# Patient Record
Sex: Male | Born: 1955
Health system: Southern US, Community
[De-identification: ages and names within clinical notes are randomized; demographics above are authoritative.]

## PROBLEM LIST (undated history)

## (undated) DIAGNOSIS — G709 Myoneural disorder, unspecified: Secondary | ICD-10-CM

## (undated) DIAGNOSIS — R269 Unspecified abnormalities of gait and mobility: Secondary | ICD-10-CM

## (undated) DIAGNOSIS — M5030 Other cervical disc degeneration, unspecified cervical region: Secondary | ICD-10-CM

## (undated) DIAGNOSIS — E785 Hyperlipidemia, unspecified: Secondary | ICD-10-CM

## (undated) DIAGNOSIS — R339 Retention of urine, unspecified: Secondary | ICD-10-CM

## (undated) DIAGNOSIS — R61 Generalized hyperhidrosis: Secondary | ICD-10-CM

## (undated) DIAGNOSIS — R301 Vesical tenesmus: Secondary | ICD-10-CM

## (undated) DIAGNOSIS — R4789 Other speech disturbances: Secondary | ICD-10-CM

## (undated) DIAGNOSIS — I69319 Unspecified symptoms and signs involving cognitive functions following cerebral infarction: Secondary | ICD-10-CM

## (undated) DIAGNOSIS — Z9181 History of falling: Secondary | ICD-10-CM

## (undated) DIAGNOSIS — I739 Peripheral vascular disease, unspecified: Secondary | ICD-10-CM

## (undated) DIAGNOSIS — I69398 Other sequelae of cerebral infarction: Secondary | ICD-10-CM

## (undated) DIAGNOSIS — G8194 Hemiplegia, unspecified affecting left nondominant side: Secondary | ICD-10-CM

## (undated) DIAGNOSIS — R350 Frequency of micturition: Secondary | ICD-10-CM

## (undated) DIAGNOSIS — K5909 Other constipation: Secondary | ICD-10-CM

## (undated) DIAGNOSIS — Z794 Long term (current) use of insulin: Secondary | ICD-10-CM

## (undated) DIAGNOSIS — G4700 Insomnia, unspecified: Secondary | ICD-10-CM

## (undated) DIAGNOSIS — K5900 Constipation, unspecified: Secondary | ICD-10-CM

## (undated) DIAGNOSIS — N3941 Urge incontinence: Secondary | ICD-10-CM

## (undated) DIAGNOSIS — K922 Gastrointestinal hemorrhage, unspecified: Secondary | ICD-10-CM

## (undated) DIAGNOSIS — M1990 Unspecified osteoarthritis, unspecified site: Secondary | ICD-10-CM

## (undated) DIAGNOSIS — R1310 Dysphagia, unspecified: Secondary | ICD-10-CM

## (undated) DIAGNOSIS — M4802 Spinal stenosis, cervical region: Secondary | ICD-10-CM

## (undated) DIAGNOSIS — N189 Chronic kidney disease, unspecified: Secondary | ICD-10-CM

## (undated) DIAGNOSIS — I6521 Occlusion and stenosis of right carotid artery: Secondary | ICD-10-CM

## (undated) DIAGNOSIS — C61 Malignant neoplasm of prostate: Secondary | ICD-10-CM

## (undated) DIAGNOSIS — K648 Other hemorrhoids: Secondary | ICD-10-CM

## (undated) DIAGNOSIS — H903 Sensorineural hearing loss, bilateral: Secondary | ICD-10-CM

## (undated) DIAGNOSIS — E119 Type 2 diabetes mellitus without complications: Secondary | ICD-10-CM

## (undated) DIAGNOSIS — N3289 Other specified disorders of bladder: Secondary | ICD-10-CM

## (undated) DIAGNOSIS — M19012 Primary osteoarthritis, left shoulder: Secondary | ICD-10-CM

## (undated) DIAGNOSIS — Z8673 Personal history of transient ischemic attack (TIA), and cerebral infarction without residual deficits: Secondary | ICD-10-CM

## (undated) DIAGNOSIS — I10 Essential (primary) hypertension: Secondary | ICD-10-CM

## (undated) DIAGNOSIS — Z960 Presence of urogenital implants: Secondary | ICD-10-CM

## (undated) DIAGNOSIS — Z7901 Long term (current) use of anticoagulants: Secondary | ICD-10-CM

## (undated) DIAGNOSIS — Z972 Presence of dental prosthetic device (complete) (partial): Secondary | ICD-10-CM

## (undated) DIAGNOSIS — I639 Cerebral infarction, unspecified: Secondary | ICD-10-CM

## (undated) HISTORY — DX: Hyperlipidemia, unspecified: E78.5

## (undated) HISTORY — DX: Essential (primary) hypertension: I10

---

## 1998-04-14 ENCOUNTER — Emergency Department (HOSPITAL_COMMUNITY): Admission: EM | Admit: 1998-04-14 | Discharge: 1998-04-14 | Payer: Self-pay | Admitting: Emergency Medicine

## 1998-04-14 ENCOUNTER — Encounter: Payer: Self-pay | Admitting: Emergency Medicine

## 1998-09-21 ENCOUNTER — Emergency Department (HOSPITAL_COMMUNITY): Admission: EM | Admit: 1998-09-21 | Discharge: 1998-09-21 | Payer: Self-pay | Admitting: Emergency Medicine

## 1998-09-22 ENCOUNTER — Emergency Department (HOSPITAL_COMMUNITY): Admission: EM | Admit: 1998-09-22 | Discharge: 1998-09-22 | Payer: Self-pay

## 1999-01-29 ENCOUNTER — Emergency Department (HOSPITAL_COMMUNITY): Admission: EM | Admit: 1999-01-29 | Discharge: 1999-01-29 | Payer: Self-pay | Admitting: Emergency Medicine

## 1999-11-03 ENCOUNTER — Encounter: Payer: Self-pay | Admitting: Internal Medicine

## 1999-11-03 ENCOUNTER — Encounter: Payer: Self-pay | Admitting: Emergency Medicine

## 1999-11-03 ENCOUNTER — Observation Stay (HOSPITAL_COMMUNITY): Admission: EM | Admit: 1999-11-03 | Discharge: 1999-11-04 | Payer: Self-pay | Admitting: Emergency Medicine

## 1999-11-04 ENCOUNTER — Encounter: Payer: Self-pay | Admitting: Internal Medicine

## 1999-11-27 ENCOUNTER — Inpatient Hospital Stay (HOSPITAL_COMMUNITY): Admission: RE | Admit: 1999-11-27 | Discharge: 1999-11-28 | Payer: Self-pay | Admitting: Neurosurgery

## 1999-11-27 ENCOUNTER — Encounter: Payer: Self-pay | Admitting: Neurosurgery

## 1999-11-27 HISTORY — PX: ANTERIOR CERVICAL DECOMP/DISCECTOMY FUSION: SHX1161

## 1999-12-17 ENCOUNTER — Encounter: Payer: Self-pay | Admitting: Neurosurgery

## 1999-12-17 ENCOUNTER — Encounter: Admission: RE | Admit: 1999-12-17 | Discharge: 1999-12-17 | Payer: Self-pay | Admitting: Neurosurgery

## 2000-01-23 ENCOUNTER — Encounter: Payer: Self-pay | Admitting: Neurosurgery

## 2000-01-23 ENCOUNTER — Encounter: Admission: RE | Admit: 2000-01-23 | Discharge: 2000-01-23 | Payer: Self-pay | Admitting: Neurosurgery

## 2000-12-03 ENCOUNTER — Emergency Department (HOSPITAL_COMMUNITY): Admission: EM | Admit: 2000-12-03 | Discharge: 2000-12-03 | Payer: Self-pay | Admitting: Emergency Medicine

## 2000-12-15 ENCOUNTER — Encounter: Admission: RE | Admit: 2000-12-15 | Discharge: 2000-12-15 | Payer: Self-pay | Admitting: Family Medicine

## 2001-10-07 ENCOUNTER — Ambulatory Visit (HOSPITAL_BASED_OUTPATIENT_CLINIC_OR_DEPARTMENT_OTHER): Admission: RE | Admit: 2001-10-07 | Discharge: 2001-10-07 | Payer: Self-pay | Admitting: Urology

## 2001-10-07 HISTORY — PX: CIRCUMCISION: SUR203

## 2003-01-08 HISTORY — PX: FOOT SURGERY: SHX648

## 2004-07-25 ENCOUNTER — Ambulatory Visit: Payer: Self-pay | Admitting: Gastroenterology

## 2004-08-06 ENCOUNTER — Encounter (INDEPENDENT_AMBULATORY_CARE_PROVIDER_SITE_OTHER): Payer: Self-pay | Admitting: *Deleted

## 2004-08-06 ENCOUNTER — Ambulatory Visit: Payer: Self-pay | Admitting: Gastroenterology

## 2005-09-23 ENCOUNTER — Ambulatory Visit: Payer: Self-pay | Admitting: Gastroenterology

## 2005-10-15 ENCOUNTER — Encounter (INDEPENDENT_AMBULATORY_CARE_PROVIDER_SITE_OTHER): Payer: Self-pay | Admitting: *Deleted

## 2005-10-15 ENCOUNTER — Ambulatory Visit: Payer: Self-pay | Admitting: Gastroenterology

## 2005-12-16 ENCOUNTER — Encounter: Admission: RE | Admit: 2005-12-16 | Discharge: 2005-12-16 | Payer: Self-pay | Admitting: Internal Medicine

## 2005-12-18 ENCOUNTER — Encounter: Admission: RE | Admit: 2005-12-18 | Discharge: 2006-01-03 | Payer: Self-pay | Admitting: Internal Medicine

## 2006-11-28 ENCOUNTER — Ambulatory Visit (HOSPITAL_COMMUNITY): Admission: RE | Admit: 2006-11-28 | Discharge: 2006-11-28 | Payer: Self-pay | Admitting: Orthopaedic Surgery

## 2006-11-28 HISTORY — PX: SHOULDER ARTHROSCOPY WITH SUBACROMIAL DECOMPRESSION: SHX5684

## 2006-12-22 ENCOUNTER — Encounter: Admission: RE | Admit: 2006-12-22 | Discharge: 2007-02-04 | Payer: Self-pay | Admitting: Orthopaedic Surgery

## 2007-10-13 ENCOUNTER — Emergency Department (HOSPITAL_COMMUNITY): Admission: EM | Admit: 2007-10-13 | Discharge: 2007-10-13 | Payer: Self-pay | Admitting: Emergency Medicine

## 2010-05-22 NOTE — Op Note (Signed)
NAME:  Billy Torres, Billy Torres             ACCOUNT NO.:  [id number]   MEDICAL RECORD NO.:  [id number]          PATIENT TYPE:  AMB   LOCATION:  SDS                          FACILITY:  [facility]   PHYSICIAN:  Vanita Panda. Magnus Ivan, M.D.DATE OF BIRTH:  01-Nov-1955   DATE OF PROCEDURE:  11/28/2006  DATE OF DISCHARGE:                               OPERATIVE REPORT   PREOPERATIVE DIAGNOSIS:  Left shoulder impingement syndrome.   SURGEON:  Left shoulder impingement syndrome.   PROCEDURES:  1. Left shoulder arthroscopy with extensive debridement of the      glenohumeral joint.  2. Left shoulder subacromial decompression with partial acromioplasty      and coracoacromial ligament release.   SURGEON:  Vanita Panda.  Magnus Ivan, MD.   ANESTHESIA:  1. Regional left shoulder block.  2. General.   ANTIBIOTICS:  1 g IV Ancef.   BLOOD LOSS:  Minimal.   COMPLICATIONS:  None.   INDICATIONS:  Briefly, Billy Torres is a 55 year old who has had left  shoulder pain for many months now.  He was diagnosed with impingement  syndrome on physical exam and based on MRI results.  I have tried anti-  inflammatories as well as rest of the shoulder and injections.  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  The next step is proceeding with subacromial decompression.  The risks  and benefits of surgery were explained to him and well-understood and he  agreed to proceed with surgery.   PROCEDURE DESCRIPTION:  After informed consent was obtained, the  appropriate left shoulder was marked.  A regional block was obtained by  anesthesia.  He then brought to the operating room and placed supine on  the operative table.  General anesthesia was then obtained.  He then  fashioned to a beach-chair position with appropriate positioning of the  head and neck, appropriate padding of the down, nonoperative arm, and  bending of the waist and knee.  The left arm was then prepped and draped  with DuraPrep and sterile drapes and placed in in-line skeletal traction  with 45 degrees of forward flexion, neutral adduction and abduction,  neutral rotation, and 10 pounds of traction.  A time-out was called and  he was identified as the correct patient and correct extremity.  I then  made a posterior portal off the posterior lateral edge of the acromion  two fingerbreadths distal and one fingerbreadth medial.  The  glenohumeral joint was entered with a the arthroscope.  Right away you  could see that there was inflamed tissue throughout the glenohumeral  joint.  The rotator cuff appeared to be intact as well as the biceps  tendon.  There was a frayed labrum anteriorly as well as the inferior  glenohumeral ligament.  There was abundant fraying at the biceps anchor  but it was intact.  It appeared to be almost a type 1 SLAP lesion.  An  anterior portal was then made off just lateral to the coracoid process  and an arthroscopic soft tissue ablation wand was inserted and a  thorough, extensive debridement was  carried out in the glenohumeral  joint.  Next, I entered the subacromial space through the posterior  portal and a lateral portal was made, and there was significant bursitis  and tendinosis and tendinopathy in the subacromial space in the rotator  cuff tendon.  Soft tissue ablation wand and arthroscopic shaver was  placed and a thorough debridement was carried out with a bursectomy and  I could put the shoulder a through range of motion and it had less  impingement.  I then used the soft tissue ablation wand to release the  CA ligament.  I used an arthroscopic high-speed bur to perform a partial  acromioplasty as well as co-planing the clavicle.  All instrumentation  was then removed and the four sites were closed with interrupted 4-0  nylon suture.  Xeroform followed by a well-padded sterile dressing was  applied.  The patient's arm was placed in a sling and he was awakened,   extubated and taken to the recovery room in stable condition.  An ice  pack was placed on his shoulder, all final counts were correct, and  there were no complications noted.      Vanita Panda. Magnus Ivan, M.D.  Electronically Signed     CYB/MEDQ  D:  11/28/2006  T:  11/29/2006  Job:  [id number]

## 2010-05-25 NOTE — Op Note (Signed)
   NAME:  Billy Torres, Billy Torres                       ACCOUNT NO.:  [id number]   MEDICAL RECORD NO.:  [id number]                   PATIENT TYPE:  AMB   LOCATION:  NESC                                 FACILITY:  [facility]   PHYSICIAN:  Maretta Bees. Vonita Moss, M.D.             DATE OF BIRTH:  [birth year]   DATE OF PROCEDURE:  10/07/2001  DATE OF DISCHARGE:                                 OPERATIVE REPORT   PREOPERATIVE DIAGNOSIS:  Phimosis.   POSTOPERATIVE DIAGNOSIS:  Phimosis.   PROCEDURE:  Circumcision.   SURGEON:  Maretta Bees. Vonita Moss, M.D.   ANESTHESIA:  General.   INDICATIONS FOR PROCEDURE:  This 55 year old gentleman has had progressive  problems over the last several years of pain and discomfort and caused by  increasingly tight foreskin.  He is brought to the OR today for circumcision  to correct this problem and he was advised about the risks of hemorrhage,  bruising, infection, etc.   PROCEDURE:  The patient brought to the operating room, placed in lithotomy  position, external genitalia were prepped and draped in the usual fashion.  A circumcision was performed using the sleeve technique.  Hemostasis was  obtained by the use of electrocautery.  The frenular area was closed with a  running 4-0 chromic catgut.  The distal edge of the penile skin was  reapproximated to the residual edge of mucosal foreskin at 3, 6, 9, and 12  o'clock with interrupted 4-0 chromic catgut.  Running 4-0 catgut was placed  between these quadrant sutures.  Marcaine 0.25% was injected for local  anesthesia postop.  The wound was cleaned and dressed with Vaseline gauze,  dry sterile gauze, and a Coban elastic dressing.  He went straight to the  recovery room in good condition with negligible blood loss having tolerated  the procedure well.                                               Maretta Bees. Vonita Moss, M.D.    LJP/MEDQ  D:  10/07/2001  T:  10/07/2001  Job:  [id number]

## 2010-05-25 NOTE — Discharge Summary (Signed)
[place]. [facility]  Patient:    Billy Torres, Billy Torres                    MRN: [id number] Adm. Date:  [id number] Disc. Date: 11/04/99 Attending:  Madaline Guthrie Dictator:   Gillian Shields, M.D.                           Discharge Summary  DATE OF BIRTH:  1955-12-05  DISCHARGE DIAGNOSES: 1. Ruled out for myocardial infarction. 2. Left arm numbness and pain, question of cervical spine disease.  DISCHARGE MEDICATIONS:  Percocet 5/325 mg 1 to 2 p.o. q.6h. p.r.n. pain, #30 given.  PROCEDURES:  None.  CONSULTATIONS:  None.  ADMISSION HISTORY & PHYSICAL:  This 55 year old with no prior history of cardiac disease presented to the emergency department complaining of a three-week history of left-sided chest and arm pain.  The pain was present upon awakening approximately three weeks ago and is constant, squeezing, and has gradually become worse over the past one week to a severity of 8/10.  His worst pain is in the left arm rather than the chest.  He also complains of numbness in the arm.  He has had numbness at that location for greater than six months but never this severe, and has been gradually worsening.  It was initially worse only while he was driving.  There is no history of injury or trauma to the left upper extremity or his neck.  His pain is worst at rest and when moving his arm, particularly with abduction or external rotation.  The pain improves when he is busy and not thinking about it.  The pain radiates into the left side of his neck.  He denies dyspnea, diaphoresis, PND, or orthopnea.  He sleeps on one pillow.  He denies heartburn or any relationship of his pain to food, occasional nausea over the past three weeks but no vomiting.  No family history of cardiovascular disease.  For risk factors, he has been a smoker for 30 years.  REVIEW OF SYSTEMS:  Negative for headache, weakness, vision changes, cough, palpitations, diarrhea,  constipation, melena, hematochezia, dysuria, urgency, or frequency.  MEDICATIONS:  None.  ALLERGIES:  No known drug allergies.  PAST MEDICAL HISTORY:  Left arm pain and numbness as above.  History of migraine.  Status post right great toe fracture.  Status post removal of a noncancerous scrotal tumor.  FAMILY HISTORY:  He has four sisters and four brothers.  One sister has diabetes.  One sister has hypertension.  There is no cardiovascular disease or MI.  His mother takes nitroglycerin; he is not sure why.  SOCIAL HISTORY:  He lives in [place] with wife and two sons.  He smokes one-half pack per day x 30 years.  He denies alcohol or illicit drug use. Caffeine: Two to three cups of coffee q.d.  He works at [employer], regularly lifting loads of 10 to 50 pounds.  ADMISSION PHYSICAL EXAMINATION:  VITAL SIGNS:  Temperature 98.3, heart rate 86, respirations 16, blood pressure 107/65, oxygen saturation 100% on room air.  GENERAL:  This is an alert, pleasant male in no acute distress.  HEENT:  Normocephalic, atraumatic.  Extraocular movements intact.  Pupils are equal, round and reactive to light and accommodation.  Tympanic membranes clear.  Oropharynx clear.  Mucosa moist.  Poor dentition of the lower teeth. The upper part of his mouth is  edentulous.  NECK:  Supple with no lymphadenopathy, no jugular venous distension.  CARDIOVASCULAR:  Regular rate and rhythm.  Normal S1, S2.  No murmur, rub, or gallop.  CHEST:  Clear to auscultation bilaterally.  ABDOMEN:  Soft, nontender, nondistended.  Positive bowel sounds.  No hepatosplenomegaly.  EXTREMITIES:  Pulses 2+, no cyanosis or edema.  He does have clubbing.  NEUROLOGIC:  Sensory and motor grossly intact.  MUSCULOSKELETAL:  There is increased pain of the left upper extremity at the shoulder with abduction, external rotation.  No pain to palpation, no bony deformity, no warmth or edema at the joint.  ADMISSION  LABORATORY DATA:  Sodium 143, potassium 4.7, chloride 105, bicarb 26, BUN 13, creatinine 1.1, glucose 81.  White blood cells 8.6, hemoglobin 15.6, hematocrit 45.8, platelets 271, MCV 81.8, RDW 12.4.  CK total 98, CK-MB 1.4, troponin 0.02.  Chest x-ray shows no active disease.  A plain film of the left shoulder shows no active disease, no fracture.  EKG shows normal sinus rhythm, no ST changes or Q waves; this was during pain.  HOSPITAL COURSE:  #1 - CHEST PAIN, RULED OUT FOR MYOCARDIAL INFARCTION:  Mr. Sapia has cardiac risk factors of smoking and a possible family history.  His pain was atypical.  He had no EKG changes during his pain.  Despite having had three weeks of pain, he also had no changes in his cardiac enzymes.  I believe his pain is noncardiac, most likely musculoskeletal.  He was admitted to rule out MI.  A fasting lipid panel is pending at the time of discharge for further risk stratification.  Tobacco cessation is offered but declined at this time. I will continue to discuss this issue with him as an outpatient.  #2 - LEFT ARM PAIN:  His pain responded poorly to nitroglycerin, but he had an excellent response to Darvocet and morphine which further confirms my suspicion that this is a musculoskeletal pain.  Plain films indicated there is no bony deformity, fracture, spondylosis, or osteoarthritis in the left upper extremity itself.  An MRI of the neck is the next step in evaluation to evaluate for cervical spine disease.  We will complete the MRI today, and I will follow up the results with him in my outpatient clinic over the next couple of weeks.  At this time, he denies having weakness of the arm, and his strength is 5/5 bilaterally.  He understand that, if he has any acute changes in his ability to use the arm or experiences weakness or changes in the quality of his pain, that he is to contact me immediately for prompt followup.  #3 - DECREASED MEMORY:   According to the patients wife, he has been more forgetful over the last three weeks.  I did not see evidence of this during  his visit but will check a TSH and B12 level to rule out possible metabolic causes.  Additionally, he does have a somewhat flat affect and may be depressed as they are experiencing considerable money problems at this time. It could also be related to intractable pain which has been inadequately treated.  This will be followed up as an outpatient over the next two weeks.  DISCHARGE LABORATORY DATA:  Includes a final set of cardiac enzymes with troponin of less than 0.01, CK total 71, and CK-MB 2.1.  His EKG remained in normal sinus rhythm with no acute ST-T changes.  FOLLOWUP:  [facility] with myself.  The family does not  have a home telephone, so I will arrange for an appointment to be made within the next two weeks for him to come in for followup, and the patient will contact the clinic tomorrow.  Items to follow up at that visit include the MRI of the cervical spine, TSH, B12, and smoking cessation. DD:  11/04/99 TD:  11/04/99 Job: [id number]

## 2010-05-25 NOTE — H&P (Signed)
[place]. [facility]  Patient:    Billy Torres, Billy Torres                    MRN: [id number] Adm. Date:  [id number] Attending:  Josie Saunders                         History and Physical  CHIEF COMPLAINT: Herniated cervical disk.  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 Maple Hudson for neurosurgery consultation regarding a herniated cervical disk.  I initially saw him in my office on November 16, 1999.  He noted the onset of left-sided neck, chest, arm, and shoulder pain, with pain into his entire left hand starting four weeks ago. He said he initially had chest pain and was evaluated at the hospital for myocardial infarction and this was ruled out.  He said the chest pain stopped approximately one week ago but he says his arm pain has been getting worse and he is aware of increased weakness in his left arm.  He notes numbness into his left leg, which started last week, and says his whole leg is going numb.  He says the pain is waking him up a lot.  He has taken Percocet 10/650 mg 1 q.6h as needed for pain, and says this is not holding him well with his degree of pain.  He denies any bowel or bladder dysfunction or any right lower extremity dysfunction.  He has a history 16 years ago of a nonmalignant tumor removed from his left testicle.  He said that he has had previous episodes of pain in his left arm and neck last year but this got better, but it is nothing like his current pain complaints.  Billy Torres and his wife note that he has had some problems with his memory and that he is having difficulty remembering things from his Bible study, but he also notes that this has largely become more of a problem since he has been taking pain medication and has been having a lot of pain and this has been interfering with  his ability to sleep.  He denies any history of alcohol abuse.  Billy Torres underwent an MRI of the cervical spine, which was obtained at [facility] while an inpatient on November 04, 1999.  This demonstrated a large left posterolateral disk herniation at the C5-6 level with extruded disk material causes mass effect on the left ventral aspect of the spinal cord, which is displaced posteriorly and to the right.  There is also significant narrowing of the left C5-6 neural foramen, with significant mass effect on the left C6 nerve root.  REVIEW OF SYSTEMS: A detailed Review Of Systems sheet was reviewed with the patient.  Pertinent positives include the following.  CONSTITUTIONAL: He notes weight loss.  RESPIRATORY: His last chest x-ray was done during his recent hospitalization along with blood work.  MUSCULOSKELETAL: He notes arm weakness, arm pain, leg pain, joint pain, swelling, and neck pain. NEUROLOGIC: He notes problems with his memory, inability to concentrate, double or blurred vision.  All other systems are negative.  PAST MEDICAL HISTORY:  1. Current medical conditions.  2. Previous testicular tumor, which he says was benign and no further     treatment was required.  He has surgery for that.  CURRENT MEDICATIONS: Percocet 10/650 mg 1 q.6h as-needed for pain.  ALLERGIES: No known drug allergies.  FAMILY HISTORY: His mother is age 33, with elevated blood pressure.  His fathers health history is unspecified.  SOCIAL HISTORY: Billy Torres is a half a pack per day smoker and has been smoking since the age of 82.  He is a nondrinker of alcoholic beverages. There is no history of substance abuse.  He has noted some recent weight loss.  DIAGNOSTIC STUDIES: Plain x-rays demonstrate significant disk space narrowing at the C5-6 level.  MRI is as noted above.  PHYSICAL EXAMINATION:  VITAL SIGNS: Height 5 feet 7 inches.  Weight 157 pounds.  GENERAL: On  examination today Billy Torres is a pleasant, cooperative, clearly uncomfortable man who sits rigidly in the examining room.  He keeps his head turned to the right.  HEENT: Head normocephalic, atraumatic.  PERRL.  EOMI.  Sclerae white. Conjunctivae pink.  Oropharynx benign.  Uvula midline.  NECK: No masses, meningismus, deformities, tracheal deviation, jugular venous distention, or carotid bruits.  He has decreased cervical range of motion and mildly positive Spurling maneuver to the left, negative to the right. Negative Lhermittes sign with axial compression.  He complains of increased pain with extension of his neck and holds his neck fairly rigidly.  He has paravertebral spasm, left greater than right.  RESPIRATORY: There is normal respiratory effort with good intercostal function.  The lungs are clear to auscultation.  There are no rales, rhonchi, or wheezes.  CARDIOVASCULAR: The heart shows regular rate and rhythm to auscultation.  No murmurs are appreciated.  ABDOMEN: Soft, nontender.  No hepatosplenomegaly appreciated.  No masses appreciated.  Bowel sounds active.  No rebound or guarding.  EXTREMITIES: There is no extremity clubbing, cyanosis, or edema.  There are palpable pedal pulses.  MUSCULOSKELETAL: The patient is able to walk about the examining room with a normal heel-to-toe and casual gait.  NEUROLOGIC: The patient is oriented to time, person, and place, and has good recall for both recent and remote memory, with normal attention span and concentration.  The patient speaks with clear and fluent speech and exhibits normal language function and appropriate fund of knowledge.  No gross difficulties with memory were appreciated.  Cranial nerve examination shows the pupils are equal, round, and reactive to light.  Extraocular movements were full.  Visual fields are full to confrontational testing.  Facial sensation and facial motor are intact and symmetric.  Hearing is  intact to finger rub.  The palate is upgoing.  Shoulder shrug is symmetric.  The tongue protrudes in the midline.  Motor examination shows motor strength is 5/5 in  the bilateral deltoids, right biceps, triceps, hand grips, right wrist extensors, interossei; 4-/5 in left biceps strength and 4-/5 left wrist extensor strength.  In the lower extremities motor strength is 5/5 in hip flexion and extension, quadriceps, ham strings, plantar flexion, dorsiflexion, and extensor hallucis longus.  Sensory examination is normal to light touch and pinprick throughout the upper and lower extremities with the exception of hyperesthesia to pinprick in the left C6 distribution.  Deep tendon reflexes are 1 in the biceps, triceps, and brachial radialis on the right; 1 in the triceps and brachial radialis on the left; absent at the left biceps; 2 in the knees; 2 in the ankles.  Great toes are downgoing to plantar stimulation. Cerebellar examination is normal.  Coordination in the upper and lower extremities is normal and rapid alternating movements.  Romberg test is  negative.  IMPRESSION/PLAN: Billy Torres is a 55 year old man with significant left upper extremity pain and weakness in the C6 distribution, with evidence of cervical radiculopathy effecting the C6 nerve root, with a large C5-6 disk herniation on the left.  I have recommended that he undergo anterior cervical diskectomy and fusion at the C5-6 level with allograft and bone graft.  I went over the diagnostic studies in detail and reviewed surgical models and also discussed the exact nature of the surgical procedure, attendant risks, potential benefits, and typical operative and postoperative course.  I discussed the risks of surgery which include, but not limited to, the risks of anesthesia, blood loss, infection, injury to various neck structures including the trachea and esophagus which could cause either temporary or permanent swallowing  difficulties and also the potential for perforation of the esophagus which might require operative intervention, larynx or recurrent laryngeal nerve which could cause either temporary or permanent vocal cord paralysis resulting in either temporary or permanent voice changes, injury to the cervical nerve roots which could cause either temporary or permanent arm pain, numbness, and/or weakness.  There is a small chance of injury to the spinal cord which could cause paralysis.  There is also potential for malplacement of instrumentation, fusion failure and need for repeat surgery, degenerative disk disease at the level in the neck, failure to relieve the pain, worsening of pain.  I discussed with the patient that he will lose some neck mobility from the surgery.  I also advised him to stop smoking.  It is typical for him to have to stay in the hospital overnight after this operation.  Typically he will not be able to drive for two weeks after surgery and then is to come back to see me two weeks following surgery, with a lateral cervical spine x-ray to be obtained, and then monthly visits for up to three months after surgery.  Generally patients are out of work four to six weeks after surgery.  He will wear a soft collar for two weeks after surgery.  I recommended to Billy Torres that because of the severity of his pain he go ahead with surgery as soon as possible.  He said he was unable to do this before November 27, 1999 and consequently we set up the surgery for that date. DD:  11/27/99 TD:  11/27/99 Job: [id number]

## 2010-05-25 NOTE — Op Note (Signed)
[place]  Patient:    Billy Torres, Billy Torres                    MRN: [id number] Proc. Date: 11/27/99 Adm. Date:  [id number] Attending:  Josie Saunders                           Operative Report  PREOPERATIVE DIAGNOSIS:  Herniated cervical disk, C5-6 with spondylosis, degenerative disk disease and cervical radiculopathy.  POSTOPERATIVE DIAGNOSIS:  Herniated cervical disk, C5-6 with spondylosis, degenerative disk disease and cervical radiculopathy.  OPERATION PERFORMED:  Anterior cervical diskectomy and fusion C5-6 level with allograft bone graft and anterior cervical plate.  SURGEON:  Danae Orleans. Venetia Maxon, M.D.  ASSISTANT:  Cristi Loron, M.D.  ANESTHESIA:  General endotracheal.  ESTIMATED BLOOD LOSS:  Minimal.  COMPLICATIONS:  None.  DISPOSITION:  Recovery.  INDICATIONS FOR PROCEDURE:  The patient is a 55 year old man with severe left arm pain and weakness with a large herniated disk at the C5-6 level.  It was elected to take him to surgery for anterior cervical diskectomy and fusion.  DESCRIPTION OF PROCEDURE:  The patient was brought to the operating room. Following satisfactory and uncomplicated induction of general endotracheal anesthesia, the patient was placed in supine position on the operating table. His neck was placed in slight extension.  He was placed into 10 pounds of halter traction.  The anterior neck was then prepped and draped in the usual sterile fashion.  The area of planned incision was infiltrated with 0.25% Marcaine and 0.5% lidocaine and 1:200,000 epinephrine.  Incision was made from the midline to the anterior border of the sternocleidomastoid muscle on the left side of the midline, carried through platysma layer.  Subplatysmal dissection was performed exposing the anterior border of the sternocleidomastoid muscle.  Subplatysmal dissection was performed and blunt dissection was performed keeping the carotid  sheath lateral and tracheal and esophagus medial exposing the anterior cervical spine.  The disk space felt to be the C5-6 level was identified and spinal needle was placed and intraoperative x-ray confirmed this to be the C5-6 level.  Using the Unipolar cautery, the longus colli muscles were then taken down from the anterior cervical spine at the C5 and C6 levels along with dissection using the Key elevator.  A self-retaining Shadowline retractor was replaced to facilitate exposure.  The disk space was then incised.  The disk material was then removed in a piecemeal fashion.  Using a variety of Carlens microcurets the end plates were stripped of residual disk and cartilaginous material.  The disk space spreader was placed.  The microscope was brought into the field. Using high power microscopic visualization, the end plates of C5 and C6 were decorticated and uncinate spurs were drilled down.  The posterior longitudinal ligament had an obvious rent to the left of the midline and this was incised. This was opened with an arachnoid knife and several very large pieces of free disk material were removed.  The posterior longitudinal ligament was then further removed along with uncinate spurs and both C6 nerve roots were decompressed as they extended out the neural foraminae.  The central canal of the dura was also decompressed.  Hemostasis was obtained with Gelfoam soaked in thrombin.  An 8 mm thick iliac crest tricortical graft was then cut to a depth of 13 mm and placed in the interspace and countersunk appropriately. The halter retraction was removed.  A 16 mm Tekken tether plate was then affixed to the anterior cervical spine four 13 x 4 mm variable angle screws. THe screws had excellent purchase.  Construct appeared to be stable.  Final x-ray confirmed positioning of bone graft and anterior cervical plate.  The wound was copiously irrigated with bacitracin and saline.  There was  excellent hemostasis.  The self-retaining retractor was removed.  The platysma layer was then closed with 3-0 Vicryl sutures.  The subcuticular layer was closed with running 4-0 Vicryl subcuticular stitch.  The wound was dressed with benzoin and Steri-Strips, Telfa and tape.  The patient was extubated in the operating room and taken to recovery room in stable and satisfactory condition having tolerated the operation well.  Counts were correct at the end of the case. DD:  11/27/99 TD:  11/27/99 Job: [id number]

## 2010-10-16 LAB — CBC
MCHC: 33.8
MCV: 81.4
Platelets: 246
RDW: 12.8

## 2010-10-16 LAB — BASIC METABOLIC PANEL
BUN: 9
CO2: 29
Chloride: 99
GFR calc non Af Amer: >60
Glucose, Bld: 112 — ABNORMAL HIGH
Sodium: 135

## 2013-06-27 ENCOUNTER — Inpatient Hospital Stay (HOSPITAL_COMMUNITY)
Admission: EM | Admit: 2013-06-27 | Discharge: 2013-06-29 | DRG: 066 | Disposition: A | Discharge status: Home / Self Care | Payer: BC Managed Care – PPO | Attending: Internal Medicine | Admitting: Internal Medicine

## 2013-06-27 ENCOUNTER — Observation Stay (HOSPITAL_COMMUNITY): Payer: BC Managed Care – PPO

## 2013-06-27 ENCOUNTER — Encounter (HOSPITAL_COMMUNITY): Payer: Self-pay | Admitting: Emergency Medicine

## 2013-06-27 DIAGNOSIS — E785 Hyperlipidemia, unspecified: Secondary | ICD-10-CM | POA: Diagnosis present

## 2013-06-27 DIAGNOSIS — G458 Other transient cerebral ischemic attacks and related syndromes: Secondary | ICD-10-CM

## 2013-06-27 DIAGNOSIS — IMO0001 Reserved for inherently not codable concepts without codable children: Secondary | ICD-10-CM | POA: Diagnosis present

## 2013-06-27 DIAGNOSIS — I6789 Other cerebrovascular disease: Secondary | ICD-10-CM | POA: Diagnosis present

## 2013-06-27 DIAGNOSIS — Z833 Family history of diabetes mellitus: Secondary | ICD-10-CM

## 2013-06-27 DIAGNOSIS — Z8673 Personal history of transient ischemic attack (TIA), and cerebral infarction without residual deficits: Secondary | ICD-10-CM

## 2013-06-27 DIAGNOSIS — R29810 Facial weakness: Secondary | ICD-10-CM | POA: Diagnosis present

## 2013-06-27 DIAGNOSIS — R269 Unspecified abnormalities of gait and mobility: Secondary | ICD-10-CM | POA: Diagnosis present

## 2013-06-27 DIAGNOSIS — R471 Dysarthria and anarthria: Secondary | ICD-10-CM | POA: Diagnosis present

## 2013-06-27 DIAGNOSIS — G459 Transient cerebral ischemic attack, unspecified: Secondary | ICD-10-CM | POA: Insufficient documentation

## 2013-06-27 DIAGNOSIS — I639 Cerebral infarction, unspecified: Secondary | ICD-10-CM | POA: Diagnosis present

## 2013-06-27 DIAGNOSIS — R4789 Other speech disturbances: Secondary | ICD-10-CM | POA: Diagnosis present

## 2013-06-27 DIAGNOSIS — F17200 Nicotine dependence, unspecified, uncomplicated: Secondary | ICD-10-CM | POA: Diagnosis present

## 2013-06-27 DIAGNOSIS — I6350 Cerebral infarction due to unspecified occlusion or stenosis of unspecified cerebral artery: Principal | ICD-10-CM | POA: Diagnosis present

## 2013-06-27 DIAGNOSIS — E089 Diabetes mellitus due to underlying condition without complications: Secondary | ICD-10-CM

## 2013-06-27 DIAGNOSIS — E119 Type 2 diabetes mellitus without complications: Secondary | ICD-10-CM | POA: Diagnosis present

## 2013-06-27 DIAGNOSIS — Z8249 Family history of ischemic heart disease and other diseases of the circulatory system: Secondary | ICD-10-CM

## 2013-06-27 HISTORY — DX: Personal history of transient ischemic attack (TIA), and cerebral infarction without residual deficits: Z86.73

## 2013-06-27 LAB — CBC WITH DIFFERENTIAL/PLATELET
BASOS ABS: 0 10*3/uL (ref 0.0–0.1)
Basophils Relative: 0 % (ref 0–1)
EOS ABS: 0.3 10*3/uL (ref 0.0–0.7)
EOS PCT: 3 % (ref 0–5)
HCT: 43.2 % (ref 39.0–52.0)
Hemoglobin: 14.4 g/dL (ref 13.0–17.0)
LYMPHS ABS: 3 10*3/uL (ref 0.7–4.0)
Lymphocytes Relative: 32 % (ref 12–46)
MCH: 27.4 pg (ref 26.0–34.0)
MCHC: 33.3 g/dL (ref 30.0–36.0)
MCV: 82.3 fL (ref 78.0–100.0)
MONO ABS: 0.6 10*3/uL (ref 0.1–1.0)
MONOS PCT: 6 % (ref 3–12)
NEUTROS ABS: 5.5 10*3/uL (ref 1.7–7.7)
NEUTROS PCT: 59 % (ref 43–77)
Platelets: 241 10*3/uL (ref 150–400)
RBC: 5.25 MIL/uL (ref 4.22–5.81)
RDW: 12.5 % (ref 11.5–15.5)
WBC: 9.4 10*3/uL (ref 4.0–10.5)

## 2013-06-27 LAB — BASIC METABOLIC PANEL
BUN: 9 mg/dL (ref 6–23)
CHLORIDE: 101 meq/L (ref 96–112)
CO2: 24 mEq/L (ref 19–32)
Calcium: 8.8 mg/dL (ref 8.4–10.5)
Creatinine, Ser: 0.84 mg/dL (ref 0.50–1.35)
GFR calc Af Amer: >90 mL/min (ref >90)
Glucose, Bld: 219 mg/dL — ABNORMAL HIGH (ref 70–99)
Potassium: 4.3 mEq/L (ref 3.7–5.3)
Sodium: 137 mEq/L (ref 137–147)

## 2013-06-27 LAB — I-STAT CHEM 8, ED
BUN: 9 mg/dL (ref 6–23)
CALCIUM ION: 1.23 mmol/L (ref 1.12–1.23)
CHLORIDE: 101 meq/L (ref 96–112)
CREATININE: 1 mg/dL (ref 0.50–1.35)
GLUCOSE: 303 mg/dL — AB (ref 70–99)
HEMATOCRIT: 48 % (ref 39.0–52.0)
HEMOGLOBIN: 16.3 g/dL (ref 13.0–17.0)
Potassium: 4 mEq/L (ref 3.7–5.3)
Sodium: 141 mEq/L (ref 137–147)
TCO2: 23 mmol/L (ref 0–100)

## 2013-06-27 LAB — CBG MONITORING, ED: GLUCOSE-CAPILLARY: 207 mg/dL — AB (ref 70–99)

## 2013-06-27 LAB — GLUCOSE, CAPILLARY: GLUCOSE-CAPILLARY: 85 mg/dL (ref 70–99)

## 2013-06-27 MED ORDER — INSULIN ASPART 100 UNIT/ML ~~LOC~~ SOLN
0.0000 [IU] | Freq: Three times a day (TID) | SUBCUTANEOUS | Status: DC
  Administered 2013-06-28: 2 [IU] via SUBCUTANEOUS
  Administered 2013-06-28 – 2013-06-29 (×4): 3 [IU] via SUBCUTANEOUS
  Administered 2013-06-29: 2 [IU] via SUBCUTANEOUS

## 2013-06-27 MED ORDER — ENOXAPARIN SODIUM 40 MG/0.4ML ~~LOC~~ SOLN
40.0000 mg | SUBCUTANEOUS | Status: DC
  Administered 2013-06-27: 40 mg via SUBCUTANEOUS
  Filled 2013-06-27 (×2): qty 0.4

## 2013-06-27 MED ORDER — SODIUM CHLORIDE 0.9 % IV BOLUS (SEPSIS)
1000.0000 mL | Freq: Once | INTRAVENOUS | Status: AC
  Administered 2013-06-27: 1000 mL via INTRAVENOUS

## 2013-06-27 MED ORDER — INSULIN ASPART 100 UNIT/ML ~~LOC~~ SOLN
3.0000 [IU] | Freq: Once | SUBCUTANEOUS | Status: AC
  Administered 2013-06-27: 3 [IU] via INTRAVENOUS
  Filled 2013-06-27: qty 1

## 2013-06-27 MED ORDER — ASPIRIN 325 MG PO TABS
325.0000 mg | ORAL_TABLET | Freq: Every day | ORAL | Status: DC
  Administered 2013-06-27: 325 mg via ORAL
  Filled 2013-06-27 (×2): qty 1

## 2013-06-27 MED ORDER — ASPIRIN 81 MG PO CHEW: 81.0000 mg | CHEWABLE_TABLET | Freq: Once | ORAL | Status: DC

## 2013-06-27 MED ORDER — SODIUM CHLORIDE 0.9 % IV SOLN: INTRAVENOUS | Status: DC

## 2013-06-27 NOTE — ED Notes (Signed)
Pt c/o generalized weakness x 2 weeks, pt reports his son noticed he had some slurred speech yesterday but pt denies ever having any. Denies increased weakness on one side more than the other. No facial droop. No arm/leg drift. Denies sensation difference. Nad, skin warm and dry, resp e/u.

## 2013-06-27 NOTE — ED Notes (Signed)
CT to scan the pt before he goes upstairs.

## 2013-06-27 NOTE — Progress Notes (Signed)
Patient arrived to 4N13 AAOx4, oriented to the room, tele placed as well as the bed alarm. Patient denies pain or discomfort. Will continue to monitor. Pessy Delamar, Rande Brunt

## 2013-06-27 NOTE — ED Notes (Signed)
Admitting physician at bedside

## 2013-06-27 NOTE — H&P (Addendum)
[facility] History and Physical  Billy Torres Billy Torres DOB: Mar 30, 1955 DOA: 06/27/2013  Referring physician: ED physician.  PCP: No PCP Per Patient   Chief Complaint: Generalized weakness, slurred speech.   HPI: Billy Torres is a 58 y.o. male with no know PMH, does not have PCP who presents to ED complaining of generalized weakness for last week. His family also notice his speech was slurred this morning and the night prior to admission. Patient also was notice to have gait problems. His neurology symptoms has resolved. He denies chest pain, dyspnea, abdominal pain. He feels generalized weakness.   In the ED his Blood sugar was found to be in the 300 range.   Review of Systems:  Negative, except as per HPI.   History reviewed. No pertinent past medical history. Past Surgical History  Procedure Laterality Date  . Neck surgery     Social History:  reports that he has been smoking Cigarettes.  He has been smoking about 1.00 pack per day. He does not have any smokeless tobacco history on file. He reports that he does not drink alcohol or use illicit drugs.  No Known Allergies  Family History; Father deceased. Mother had Diabetes.   Prior to Admission medications   Not on File   Physical Exam: Filed Vitals:   06/27/13 1931  BP: 139/85  Pulse: 69  Temp:   Resp: 14    BP 139/85  Pulse 69  Temp(Src) 98.1 F (36.7 C) (Oral)  Resp 14  Ht 5\' 7"  (1.702 m)  Wt 74.844 kg (165 lb)  BMI 25.84 kg/m2  SpO2 98%  General:  Appears calm and comfortable Eyes: PERRL, normal lids, irises & conjunctiva ENT: grossly normal hearing, lips & tongue Neck: no LAD, masses or thyromegaly Cardiovascular: RRR, no m/r/g. No LE edema. Telemetry: SR, no arrhythmias  Respiratory: CTA bilaterally, no w/r/r. Normal respiratory effort. Abdomen: soft, ntnd Skin: no rash or induration seen on limited exam Musculoskeletal: grossly normal tone BUE/BLE Psychiatric: grossly normal  mood and affect, speech fluent and appropriate Neurologic: grossly non-focal. Speech clear.           Labs on Admission:  Basic Metabolic Panel:  Recent Labs Lab 06/27/13 1742  NA 141  K 4.0  CL 101  GLUCOSE 303*  BUN 9  CREATININE 1.00   Liver Function Tests: No results found for this basename: AST, ALT, ALKPHOS, BILITOT, PROT, ALBUMIN,  in the last 168 hours No results found for this basename: LIPASE, AMYLASE,  in the last 168 hours No results found for this basename: AMMONIA,  in the last 168 hours CBC:  Recent Labs Lab 06/27/13 1722 06/27/13 1742  WBC 9.4  --   NEUTROABS 5.5  --   HGB 14.4 16.3  HCT 43.2 48.0  MCV 82.3  --   PLT 241  --    Cardiac Enzymes: No results found for this basename: CKTOTAL, CKMB, CKMBINDEX, TROPONINI,  in the last 168 hours  BNP (last 3 results) No results found for this basename: PROBNP,  in the last 8760 hours CBG:  Recent Labs Lab 06/27/13 1947  GLUCAP 207*    Radiological Exams on Admission: No results found.  EKG: Independently reviewed. Sinus with PVC.   Assessment/Plan Principal Problem:   TIA (transient ischemic attack) Active Problems:   Diabetes  1-Slurred speech, gait problem. Probably TIA. Patient symptoms has resolved. No need for TPA. Admit to telemetry. MRI pending. Will start aspirin if MRI negative for intracranial bleed.  Will order ECHO, carotid doppler, fasting lipid panel, HB-A1c. Risk factors diabetes and current smoker. Neuro check. Will order also B-12, RPR.   2-Diabetes; New diagnosis. Will start SSI. Repeat B-me to follow gap. HB-A1c pending. Patient will need PCP, diabetes education.   3-Screening for HIV.   4-Current smoker: counseling provided.     Code Status: presume full code.  Family Communication: care discussed with patient.  Disposition Plan: expect less than 2 night inpatient.   Time spent: 75 minutes.   Niel Hummer A [facility] Pager [phone]  **Disclaimer:  This note may have been dictated with voice recognition software. Similar sounding words can inadvertently be transcribed and this note may contain transcription errors which may not have been corrected upon publication of note.**  CT head with hypodensity, Neurology consulted.

## 2013-06-27 NOTE — ED Notes (Signed)
Transporting patient to new room assignment. 

## 2013-06-27 NOTE — ED Provider Notes (Signed)
CSN: [id number]     Arrival date & time 06/27/13  1657 History   First MD Initiated Contact with Patient 06/27/13 1747     Chief Complaint  Patient presents with  . Weakness     (Consider location/radiation/quality/duration/timing/severity/associated sxs/prior Treatment) HPI  Patient reports generalized weakness x 2 weeks.  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  Otherwise no specific complaints.  Denies focal weakness.  Denies fevers, recent illness, numbness or tingling of the extremities, syncope, CP, SOB, abdomianl pain, N/V/D, urinary symptoms.   Son and son's significant other report patient had garbled speech last night that has now resolved.  Described as "like his tongue would get in the way."  Pt also had shuffling gait.  These have both resolved now.     History reviewed. No pertinent past medical history. Past Surgical History  Procedure Laterality Date  . Neck surgery     No family history on file. History  Substance Use Topics  . Smoking status: Current Every Day Smoker -- 1.00 packs/day    Types: Cigarettes  . Smokeless tobacco: Not on file  . Alcohol Use: No    Review of Systems  Constitutional: Positive for fatigue. Negative for fever, chills, diaphoresis and unexpected weight change.  Respiratory: Negative for cough and shortness of breath.   Cardiovascular: Negative for chest pain.  Gastrointestinal: Negative for nausea, vomiting, abdominal pain and diarrhea.  Genitourinary: Negative for dysuria, urgency and frequency.  Neurological: Negative for dizziness, weakness, light-headedness, numbness and headaches.  All other systems reviewed and are negative.     Allergies  Review of patient's allergies indicates no known allergies.  Home Medications   Prior to Admission medications   Not on File   BP 124/78  Pulse 68  Temp(Src) 98.1 F (36.7 C) (Oral)  Resp 20  Ht 5\' 7"  (1.702 m)  Wt 165 lb (74.844 kg)  BMI 25.84  kg/m2  SpO2 97% Physical Exam  Nursing note and vitals reviewed. Constitutional: He appears well-developed and well-nourished. No distress.  HENT:  Head: Normocephalic and atraumatic.  Neck: Neck supple.  Cardiovascular: Normal rate and regular rhythm.   Pulmonary/Chest: Effort normal and breath sounds normal. No respiratory distress. He has no wheezes. He has no rales.  Abdominal: Soft. He exhibits no distension and no mass. There is no tenderness. There is no rebound and no guarding.  Neurological: He is alert. He exhibits normal muscle tone.  CN II-XII intact, EOMs intact, no pronator drift, grip strengths equal bilaterally; strength 5/5 in all extremities, sensation intact in all extremities; finger to nose, heel to shin, rapid alternating movements normal; gait is normal.     Skin: He is not diaphoretic.    ED Course  Procedures (including critical care time) Labs Review Labs Reviewed  I-STAT CHEM 8, ED - Abnormal; Notable for the following:    Glucose, Bld 303 (*)    All other components within normal limits  CBG MONITORING, ED - Abnormal; Notable for the following:    Glucose-Capillary 207 (*)    All other components within normal limits  CBC WITH DIFFERENTIAL  HEMOGLOBIN D2K  BASIC METABOLIC PANEL  HIV ANTIBODY (ROUTINE TESTING)    Imaging Review No results found.   EKG Interpretation   Date/Time:  Sunday June 27 2013 17:03:03 EDT Ventricular Rate:  66 PR Interval:  146 QRS Duration: 84 QT Interval:  366 QTC Calculation: 383 R Axis:   66 Text  Interpretation:  Sinus rhythm with occasional Premature ventricular  complexes Otherwise normal ECG Confirmed by RAY MD, Andee Poles [phone]) on  06/27/2013 5:08:19 PM      PCP until a few years ago.  No known medical problems.    MDM   Final diagnoses:  New onset type 2 diabetes mellitus  Transient cerebral ischemia, unspecified transient cerebral ischemia type    Pt with generalized weakness x 2 weeks, no other  complaints found to be hyperglycemic at 303, likely new onset diabetes.  Patient's son also notes patient had garbled speech and shuffling gait last night that have completely resolved today - I suspect TIA.  MRI brain ordered.  Pt neurologically intact on my exam.  Pt admitted to [place], PA-C 06/27/13 2023

## 2013-06-28 ENCOUNTER — Observation Stay (HOSPITAL_COMMUNITY): Payer: BC Managed Care – PPO

## 2013-06-28 DIAGNOSIS — G459 Transient cerebral ischemic attack, unspecified: Secondary | ICD-10-CM

## 2013-06-28 DIAGNOSIS — I369 Nonrheumatic tricuspid valve disorder, unspecified: Secondary | ICD-10-CM

## 2013-06-28 DIAGNOSIS — G458 Other transient cerebral ischemic attacks and related syndromes: Secondary | ICD-10-CM

## 2013-06-28 LAB — RAPID URINE DRUG SCREEN, HOSP PERFORMED
Amphetamines: NOT DETECTED
BARBITURATES: NOT DETECTED
Benzodiazepines: NOT DETECTED
COCAINE: NOT DETECTED
Opiates: NOT DETECTED
Tetrahydrocannabinol: NOT DETECTED

## 2013-06-28 LAB — LIPID PANEL
CHOLESTEROL: 171 mg/dL (ref 0–200)
HDL: 34 mg/dL — ABNORMAL LOW (ref >39)
LDL Cholesterol: 104 mg/dL — ABNORMAL HIGH (ref 0–99)
Total CHOL/HDL Ratio: 5 RATIO
Triglycerides: 164 mg/dL — ABNORMAL HIGH (ref <150)
VLDL: 33 mg/dL (ref 0–40)

## 2013-06-28 LAB — GLUCOSE, CAPILLARY
Glucose-Capillary: 221 mg/dL — ABNORMAL HIGH (ref 70–99)
Glucose-Capillary: 175 mg/dL — ABNORMAL HIGH (ref 70–99)
Glucose-Capillary: 219 mg/dL — ABNORMAL HIGH (ref 70–99)
Glucose-Capillary: 139 mg/dL — ABNORMAL HIGH (ref 70–99)

## 2013-06-28 LAB — RPR

## 2013-06-28 LAB — HIV ANTIBODY (ROUTINE TESTING W REFLEX): HIV 1&2 Ab, 4th Generation: NONREACTIVE

## 2013-06-28 LAB — VITAMIN B12: Vitamin B-12: 721 pg/mL (ref 211–911)

## 2013-06-28 LAB — HEMOGLOBIN A1C
Hgb A1c MFr Bld: 10 % — ABNORMAL HIGH (ref <5.7)
Mean Plasma Glucose: 240 mg/dL — ABNORMAL HIGH (ref <117)

## 2013-06-28 MED ORDER — LIVING WELL WITH DIABETES BOOK
Freq: Once | Status: AC
  Administered 2013-06-28: 18:00:00
  Filled 2013-06-28: qty 1

## 2013-06-28 MED ORDER — ATORVASTATIN CALCIUM 20 MG PO TABS
20.0000 mg | ORAL_TABLET | Freq: Every day | ORAL | Status: DC
  Administered 2013-06-28 – 2013-06-29 (×2): 20 mg via ORAL
  Filled 2013-06-28: qty 1
  Filled 2013-06-28 (×2): qty 2
  Filled 2013-06-28: qty 1

## 2013-06-28 MED ORDER — CLOPIDOGREL BISULFATE 75 MG PO TABS
75.0000 mg | ORAL_TABLET | Freq: Every day | ORAL | Status: DC
  Administered 2013-06-28 – 2013-06-29 (×2): 75 mg via ORAL
  Filled 2013-06-28 (×3): qty 1

## 2013-06-28 MED ORDER — INSULIN STARTER KIT- SYRINGES (ENGLISH)
1.0000 | Freq: Once | Status: AC
  Administered 2013-06-28: 1
  Filled 2013-06-28: qty 1

## 2013-06-28 MED ORDER — INSULIN GLARGINE 100 UNIT/ML ~~LOC~~ SOLN
8.0000 [IU] | Freq: Every day | SUBCUTANEOUS | Status: DC
  Administered 2013-06-28 – 2013-06-29 (×2): 8 [IU] via SUBCUTANEOUS
  Filled 2013-06-28 (×3): qty 0.08

## 2013-06-28 NOTE — Progress Notes (Signed)
Utilization review completed.  

## 2013-06-28 NOTE — Progress Notes (Signed)
  Echocardiogram 2D Echocardiogram has been performed.  Diamond Nickel 06/28/2013, 8:34 AM

## 2013-06-28 NOTE — Evaluation (Signed)
Physical Therapy Evaluation and Discharge Patient Details Name: Billy Torres MRN: [id number] DOB: 1955/02/24 Today's Date: 06/28/2013   History of Present Illness  58 y.o. male with no know PMH, does not have PCP who presents to ED complaining of generalized weakness for last week. His family also notice his speech was slurred this morning and the night prior to admission. Patient also was notice to have gait problems. His neurology symptoms has resolved. He denies chest pain, dyspnea, abdominal pain. He feels generalized weakness. Blood sugar found to be in 300s upon admission on 6/21  Clinical Impression  Pt functioning at baseline. Pt at minimal falls risk as demo'd by 23/24 on DGI. Pt with no further acute skilled PT needs at this time. PT signing off. Please re-consult if needed in future.    Follow Up Recommendations No PT follow up    Equipment Recommendations  None recommended by PT    Recommendations for Other Services       Precautions / Restrictions Precautions Precautions: None Restrictions Weight Bearing Restrictions: No      Mobility  Bed Mobility Overal bed mobility: Independent             General bed mobility comments: safe technique  Transfers Overall transfer level: Modified independent Equipment used: None             General transfer comment: use of hands but safe technique, not unsteady  Ambulation/Gait Ambulation/Gait assistance: Modified independent (Device/Increase time) Ambulation Distance (Feet): 600 Feet Assistive device: None Gait Pattern/deviations: WFL(Within Functional Limits)     General Gait Details: no episodes of LOB, casual gait speed  Stairs Stairs: Yes Stairs assistance: Modified independent (Device/Increase time) Stair Management: One rail Right;Alternating pattern Number of Stairs: 10 General stair comments: no episodes of LOB  Wheelchair Mobility    Modified Rankin (Stroke Patients Only) Modified Rankin  (Stroke Patients Only) Pre-Morbid Rankin Score: No symptoms Modified Rankin: No significant disability     Balance Overall balance assessment: Modified Independent                               Standardized Balance Assessment Standardized Balance Assessment : Dynamic Gait Index   Dynamic Gait Index Level Surface: Normal Change in Gait Speed: Normal Gait with Horizontal Head Turns: Normal Gait with Vertical Head Turns: Normal Gait and Pivot Turn: Normal Step Over Obstacle: Normal Step Around Obstacles: Normal Steps: Mild Impairment Total Score: 23       Pertinent Vitals/Pain Denies pain or N/T    Home Living Family/patient expects to be discharged to:: Private residence Living Arrangements:  (son and son's gf lives with him) Available Help at Discharge: Family;Available 24 hours/day Type of Home: House Home Access: Level entry     Home Layout: One level Home Equipment: None      Prior Function Level of Independence: Independent               Hand Dominance   Dominant Hand: Right    Extremity/Trunk Assessment   Upper Extremity Assessment: Overall WFL for tasks assessed           Lower Extremity Assessment: Overall WFL for tasks assessed      Cervical / Trunk Assessment: Normal  Communication   Communication: No difficulties  Cognition Arousal/Alertness: Awake/alert Behavior During Therapy: Flat affect Overall Cognitive Status:  (mild delay in response time)  General Comments      Exercises        Assessment/Plan    PT Assessment Patent does not need any further PT services  PT Diagnosis     PT Problem List    PT Treatment Interventions     PT Goals (Current goals can be found in the Care Plan section) Acute Rehab PT Goals Patient Stated Goal: home asap PT Goal Formulation: No goals set, d/c therapy    Frequency     Barriers to discharge        Co-evaluation                End of Session Equipment Utilized During Treatment: Gait belt Activity Tolerance: Patient tolerated treatment well Patient left: in chair;with call bell/phone within reach Nurse Communication: Mobility status    Functional Assessment Tool Used: clinical judgment Functional Limitation: Mobility: Walking and moving around Mobility: Walking and Moving Around Current Status [phone]): 0 percent impaired, limited or restricted Mobility: Walking and Moving Around Goal Status [phone]): 0 percent impaired, limited or restricted Mobility: Walking and Moving Around Discharge Status [phone]): 0 percent impaired, limited or restricted    Time: 1329-1346 PT Time Calculation (min): 17 min   Charges:   PT Evaluation $Initial PT Evaluation Tier I: 1 Procedure PT Treatments $Gait Training: 8-22 mins   PT G Codes:   Functional Assessment Tool Used: clinical judgment Functional Limitation: Mobility: Walking and moving around    [place], [facility] 06/28/2013, 2:22 PM  Kittie Plater, PT, DPT Pager #: [phone] Office #: [phone]

## 2013-06-28 NOTE — Progress Notes (Signed)
Patient Demographics  Billy Torres, is a 58 y.o. male, DOB - Apr 05, 1955, HTD:[id number]  Admit date - 06/27/2013   Admitting Physician Elmarie Shiley, MD  Outpatient Primary MD for the patient is No PCP Per Patient  LOS - 1   Chief Complaint  Patient presents with  . Weakness           Subjective:   Billy Torres today has, No headache, No chest pain, No abdominal pain - No Nausea, No new weakness tingling or numbness, No Cough - SOB.      Assessment & Plan    1. L.MCA TERRITORY ISCHEMIC CVA - with some dysarthria and left-sided facial droop, speech has improved, mild facial droop continues, was on aspirin and I have switched him on Plavix, will be monitored on telemetry, will be seen by PT-OT and speech, MRI MRA noted, pending echo and carotid. Neuro following. Since LDL. was greater than 100 at it statin.    2. Dyslipidemia. Added statin  Lab Results  Component Value Date   CHOL 171 06/28/2013   HDL 34* 06/28/2013   LDLCALC 104* 06/28/2013   TRIG 164* 06/28/2013   CHOLHDL 5.0 06/28/2013     3. Newly diagnosed type 2 diabetes mellitus - will place on Lantus along with sliding scale, diabetic education  No results found for this basename: HGBA1C    CBG (last 3)   Recent Labs  06/27/13 1947 06/27/13 2152 06/28/13 0921  GLUCAP 207* 85 221*      4. Smoking. Counseled to quit.      Code Status: Full  Family Communication: None present  Disposition Plan: Home   Procedures CT head, MRI MRA brain, echo, carotid duplex   Consults  Neuro   Medications  Scheduled Meds: . clopidogrel  75 mg Oral Daily  . insulin aspart  0-9 Units Subcutaneous TID WC   Continuous Infusions:  PRN Meds:.  DVT Prophylaxis    SCDs    Lab Results  Component Value Date     PLT 241 06/27/2013    Antibiotics     Anti-infectives   None          Objective:   Filed Vitals:   06/27/13 2100 06/27/13 2211 06/28/13 0143 06/28/13 0539  BP:  149/73 114/64 123/75  Pulse:  65 70 63  Temp: 98.4 F (36.9 C) 97.7 F (36.5 C) 98.7 F (37.1 C) 98.3 F (36.8 C)  TempSrc:  Oral Oral Oral  Resp:  16 18 16   Height:      Weight:      SpO2:  99% 97% 98%    Wt Readings from Last 3 Encounters:  06/27/13 74.844 kg (165 lb)    No intake or output data in the 24 hours ending 06/28/13 0952   Physical Exam  Awake Alert, Oriented X 3, No new F.N deficits, Normal affect [place].AT,PERRAL, mild left-sided facial droop Supple Neck,No JVD, No cervical lymphadenopathy appriciated.  Symmetrical Chest wall movement, Good air movement bilaterally, CTAB RRR,No Gallops,Rubs or new Murmurs, No Parasternal Heave +ve B.Sounds, Abd Soft, No tenderness, No organomegaly appriciated, No rebound - guarding or rigidity. No Cyanosis, Clubbing or edema, No new Rash or bruise      Data Review  Micro Results No results found for this or any previous visit (from the past 240 hour(s)).  Radiology Reports Ct Head Wo Contrast  06/27/2013   CLINICAL DATA:  59 year old male with aphasia and ataxia.  EXAM: CT HEAD WITHOUT CONTRAST  TECHNIQUE: Contiguous axial images were obtained from the base of the skull through the vertex without intravenous contrast.  COMPARISON:  None.  FINDINGS: A 1 cm focal hypodensity in the right frontal periventricular white matter is indeterminate but most likely represents chronic ischemic changes or possibly an age indeterminate white matter infarct.  No acute intracranial abnormalities are identified, including mass lesion or mass effect, hydrocephalus, extra-axial fluid collection, midline shift, hemorrhage, or acute infarction.  The visualized bony calvarium is unremarkable.  IMPRESSION: Right frontal periventricular white matter hypodensity - likely chronic  ischemic change or age indeterminate infarct (probably subacute to remote). Correlate clinically.  No other acute intracranial abnormality identified.   Electronically Signed   By: Hassan Rowan M.D.   On: 06/27/2013 21:37   Mr Jodene Nam Head Wo Contrast  06/28/2013   CLINICAL DATA:  Slurred speech.  Gait imbalance.  Stroke.  EXAM: MRI HEAD WITHOUT CONTRAST  MRA HEAD WITHOUT CONTRAST  TECHNIQUE: Multiplanar, multiecho pulse sequences of the brain and surrounding structures were obtained without intravenous contrast. Angiographic images of the head were obtained using MRA technique without contrast.  COMPARISON:  CT head without contrast 06/27/2013.  FINDINGS: MRI HEAD FINDINGS  An acute 2 cm infarct is present within the anterior aspect of the left lentiform nucleus, internal capsule, and corona radiata. T1 shortening within the infarct suggests acute petechial hemorrhage.  The area adjacent to the right lateral ventricle represents a remote nonhemorrhagic infarct.  Extensive periventricular and subcortical white matter changes are present bilaterally. An additional remote lacunar infarct is present within the posterior aspect of the right lentiform nucleus. There are T2 changes associated with the acute infarct.  Flow is present in the major intracranial arteries. The globes and orbits are intact. Circumferential mucosal thickening is present in the maxillary sinuses bilaterally with debris posteriorly on the right. There is a fluid level in the posterior left ethmoid air cells. Mucosal thickening is asymmetric in the anterior right ethmoid and frontal sinuses. The mastoid air cells are clear.  MRA HEAD FINDINGS  The internal carotid arteries are within normal limits from the high cervical segments through the ICA termini bilaterally. The A1 and M1 segments are normal. A focal stenosis is present with and a proximal anterior right M3 branch. There severe stenoses within the proximal M2 branches on the left. This results in  significant differential attenuation of MCA branch vessels. Distal small vessel disease is present bilaterally.  The left vertebral artery is slightly dominant to the right. The PICA origins are visualized and normal bilaterally. The vertebrobasilar junction is normal. The basilar artery is normal. Both posterior cerebral arteries originate from the basilar tip. There is some segmental attenuation of PCA branch vessels bilaterally without a significant proximal stenosis.  IMPRESSION: 1. Acute 2 cm infarct within the anterior aspect of the left lentiform nucleus, internal capsule, and corona radiata with evidence of petechial hemorrhage. 2. The infarct adjacent to the right lateral ventricle represents a remote nonhemorrhagic infarct. 3. Extensive periventricular and subcortical white matter disease bilaterally likely reflects the sequelae of chronic microvascular ischemia. 4. Remote lacunar infarct in the posterior right lentiform nucleus. 5. High-grade stenoses within the proximal MCA branch vessels bilaterally. 6. Moderate distal small vessel disease. 7. Scattered bilateral  sinus disease. Critical Value/emergent results were called by telephone at the time of interpretation on 06/28/2013 at 8:31 AM to Dr. Candiss Norse , who verbally acknowledged these results.   Electronically Signed   By: Lawrence Santiago M.D.   On: 06/28/2013 08:32   Mr Brain Wo Contrast  06/28/2013   CLINICAL DATA:  Slurred speech.  Gait imbalance.  Stroke.  EXAM: MRI HEAD WITHOUT CONTRAST  MRA HEAD WITHOUT CONTRAST  TECHNIQUE: Multiplanar, multiecho pulse sequences of the brain and surrounding structures were obtained without intravenous contrast. Angiographic images of the head were obtained using MRA technique without contrast.  COMPARISON:  CT head without contrast 06/27/2013.  FINDINGS: MRI HEAD FINDINGS  An acute 2 cm infarct is present within the anterior aspect of the left lentiform nucleus, internal capsule, and corona radiata. T1 shortening  within the infarct suggests acute petechial hemorrhage.  The area adjacent to the right lateral ventricle represents a remote nonhemorrhagic infarct.  Extensive periventricular and subcortical white matter changes are present bilaterally. An additional remote lacunar infarct is present within the posterior aspect of the right lentiform nucleus. There are T2 changes associated with the acute infarct.  Flow is present in the major intracranial arteries. The globes and orbits are intact. Circumferential mucosal thickening is present in the maxillary sinuses bilaterally with debris posteriorly on the right. There is a fluid level in the posterior left ethmoid air cells. Mucosal thickening is asymmetric in the anterior right ethmoid and frontal sinuses. The mastoid air cells are clear.  MRA HEAD FINDINGS  The internal carotid arteries are within normal limits from the high cervical segments through the ICA termini bilaterally. The A1 and M1 segments are normal. A focal stenosis is present with and a proximal anterior right M3 branch. There severe stenoses within the proximal M2 branches on the left. This results in significant differential attenuation of MCA branch vessels. Distal small vessel disease is present bilaterally.  The left vertebral artery is slightly dominant to the right. The PICA origins are visualized and normal bilaterally. The vertebrobasilar junction is normal. The basilar artery is normal. Both posterior cerebral arteries originate from the basilar tip. There is some segmental attenuation of PCA branch vessels bilaterally without a significant proximal stenosis.  IMPRESSION: 1. Acute 2 cm infarct within the anterior aspect of the left lentiform nucleus, internal capsule, and corona radiata with evidence of petechial hemorrhage. 2. The infarct adjacent to the right lateral ventricle represents a remote nonhemorrhagic infarct. 3. Extensive periventricular and subcortical white matter disease bilaterally  likely reflects the sequelae of chronic microvascular ischemia. 4. Remote lacunar infarct in the posterior right lentiform nucleus. 5. High-grade stenoses within the proximal MCA branch vessels bilaterally. 6. Moderate distal small vessel disease. 7. Scattered bilateral sinus disease. Critical Value/emergent results were called by telephone at the time of interpretation on 06/28/2013 at 8:31 AM to Dr. Candiss Norse , who verbally acknowledged these results.   Electronically Signed   By: Lawrence Santiago M.D.   On: 06/28/2013 08:32    CBC  Recent Labs Lab 06/27/13 1722 06/27/13 1742  WBC 9.4  --   HGB 14.4 16.3  HCT 43.2 48.0  PLT 241  --   MCV 82.3  --   MCH 27.4  --   MCHC 33.3  --   RDW 12.5  --   LYMPHSABS 3.0  --   MONOABS 0.6  --   EOSABS 0.3  --   BASOSABS 0.0  --     Chemistries  Recent Labs Lab 06/27/13 1742 06/27/13 2033  NA 141 137  K 4.0 4.3  CL 101 101  CO2  --  24  GLUCOSE 303* 219*  BUN 9 9  CREATININE 1.00 0.84  CALCIUM  --  8.8   ------------------------------------------------------------------------------------------------------------------ estimated creatinine clearance is 88.5 ml/min (by C-G formula based on Cr of 0.84). ------------------------------------------------------------------------------------------------------------------ No results found for this basename: HGBA1C,  in the last 72 hours ------------------------------------------------------------------------------------------------------------------  Recent Labs  06/28/13 0516  CHOL 171  HDL 34*  LDLCALC 104*  TRIG 164*  CHOLHDL 5.0   ------------------------------------------------------------------------------------------------------------------ No results found for this basename: TSH, T4TOTAL, FREET3, T3FREE, THYROIDAB,  in the last 72 hours ------------------------------------------------------------------------------------------------------------------  Recent Labs  06/27/13 2033    VITAMINB12 721    Coagulation profile No results found for this basename: INR, PROTIME,  in the last 168 hours  No results found for this basename: DDIMER,  in the last 72 hours  Cardiac Enzymes No results found for this basename: CK, CKMB, TROPONINI, MYOGLOBIN,  in the last 168 hours ------------------------------------------------------------------------------------------------------------------ No components found with this basename: POCBNP,      Time Spent in minutes   35   SINGH,PRASHANT K M.D on 06/28/2013 at 9:52 AM  Between 7am to 7pm - Pager - [phone]  After 7pm go to [email or web address] - password [email or web address]  And look for the night coverage person covering for me after hours  [facility] Office  [phone]   **Disclaimer: This note may have been dictated with voice recognition software. Similar sounding words can inadvertently be transcribed and this note may contain transcription errors which may not have been corrected upon publication of note.**

## 2013-06-28 NOTE — Progress Notes (Signed)
Stroke Team Progress Note  HISTORY AUTHER Billy Torres is an 58 y.o. male who is unable to provide much history. He reports that his son brought him to the hospital with reports that his speech was slurred and has been so since the night prior to admission. Although the patient has had some complaints of generalized weakness, no focal complaints were noted. The patient has had some gait imbalance. Patient was admitted for further evaluation.  Date last known well: Date: 06/26/2013  Time last known well: Unable to determine  tPA Given: No: Outside treatment window     SUBJECTIVE No family is at bedside. The patient indicates that he feels well, no deficits noted.  OBJECTIVE Most recent Vital Signs: Filed Vitals:   06/27/13 2100 06/27/13 2211 06/28/13 0143 06/28/13 0539  BP:  149/73 114/64 123/75  Pulse:  65 70 63  Temp: 98.4 F (36.9 C) 97.7 F (36.5 C) 98.7 F (37.1 C) 98.3 F (36.8 C)  TempSrc:  Oral Oral Oral  Resp:  16 18 16   Height:      Weight:      SpO2:  99% 97% 98%   CBG (last 3)   Recent Labs  06/27/13 1947 06/27/13 2152 06/28/13 0921  GLUCAP 207* 85 221*    IV Fluid Intake:     MEDICATIONS  . atorvastatin  20 mg Oral q1800  . clopidogrel  75 mg Oral Q breakfast  . insulin aspart  0-9 Units Subcutaneous TID WC  . insulin glargine  8 Units Subcutaneous Daily   PRN:    Diet:  Carb Control  liquids Activity:   Up with assistance DVT Prophylaxis:  SCD  CLINICALLY SIGNIFICANT STUDIES Basic Metabolic Panel:  Recent Labs Lab 06/27/13 1742 06/27/13 2033  NA 141 137  K 4.0 4.3  CL 101 101  CO2  --  24  GLUCOSE 303* 219*  BUN 9 9  CREATININE 1.00 0.84  CALCIUM  --  8.8   Liver Function Tests: No results found for this basename: AST, ALT, ALKPHOS, BILITOT, PROT, ALBUMIN,  in the last 168 hours CBC:  Recent Labs Lab 06/27/13 1722 06/27/13 1742  WBC 9.4  --   NEUTROABS 5.5  --   HGB 14.4 16.3  HCT 43.2 48.0  MCV 82.3  --   PLT 241  --     Coagulation: No results found for this basename: LABPROT, INR,  in the last 168 hours Cardiac Enzymes: No results found for this basename: CKTOTAL, CKMB, CKMBINDEX, TROPONINI,  in the last 168 hours Urinalysis: No results found for this basename: COLORURINE, APPERANCEUR, LABSPEC, PHURINE, GLUCOSEU, HGBUR, BILIRUBINUR, KETONESUR, PROTEINUR, UROBILINOGEN, NITRITE, LEUKOCYTESUR,  in the last 168 hours Lipid Panel    Component Value Date/Time   CHOL 171 06/28/2013 0516   TRIG 164* 06/28/2013 0516   HDL 34* 06/28/2013 0516   CHOLHDL 5.0 06/28/2013 0516   VLDL 33 06/28/2013 0516   LDLCALC 104* 06/28/2013 0516   HgbA1C  No results found for this basename: HGBA1C    Urine Drug Screen:     Component Value Date/Time   LABOPIA NONE DETECTED 06/28/2013 0521   COCAINSCRNUR NONE DETECTED 06/28/2013 0521   LABBENZ NONE DETECTED 06/28/2013 0521   AMPHETMU NONE DETECTED 06/28/2013 0521   THCU NONE DETECTED 06/28/2013 0521   LABBARB NONE DETECTED 06/28/2013 0521    Alcohol Level: No results found for this basename: ETH,  in the last 168 hours  Ct Head Wo Contrast  06/27/2013   CLINICAL  DATA:  58 year old male with aphasia and ataxia.  EXAM: CT HEAD WITHOUT CONTRAST  TECHNIQUE: Contiguous axial images were obtained from the base of the skull through the vertex without intravenous contrast.  COMPARISON:  None.  FINDINGS: A 1 cm focal hypodensity in the right frontal periventricular white matter is indeterminate but most likely represents chronic ischemic changes or possibly an age indeterminate white matter infarct.  No acute intracranial abnormalities are identified, including mass lesion or mass effect, hydrocephalus, extra-axial fluid collection, midline shift, hemorrhage, or acute infarction.  The visualized bony calvarium is unremarkable.  IMPRESSION: Right frontal periventricular white matter hypodensity - likely chronic ischemic change or age indeterminate infarct (probably subacute to remote). Correlate  clinically.  No other acute intracranial abnormality identified.   Electronically Signed   By: Hassan Rowan M.D.   On: 06/27/2013 21:37   Mr Jodene Nam Head Wo Contrast  06/28/2013   CLINICAL DATA:  Slurred speech.  Gait imbalance.  Stroke.  EXAM: MRI HEAD WITHOUT CONTRAST  MRA HEAD WITHOUT CONTRAST  TECHNIQUE: Multiplanar, multiecho pulse sequences of the brain and surrounding structures were obtained without intravenous contrast. Angiographic images of the head were obtained using MRA technique without contrast.  COMPARISON:  CT head without contrast 06/27/2013.  FINDINGS: MRI HEAD FINDINGS  An acute 2 cm infarct is present within the anterior aspect of the left lentiform nucleus, internal capsule, and corona radiata. T1 shortening within the infarct suggests acute petechial hemorrhage.  The area adjacent to the right lateral ventricle represents a remote nonhemorrhagic infarct.  Extensive periventricular and subcortical white matter changes are present bilaterally. An additional remote lacunar infarct is present within the posterior aspect of the right lentiform nucleus. There are T2 changes associated with the acute infarct.  Flow is present in the major intracranial arteries. The globes and orbits are intact. Circumferential mucosal thickening is present in the maxillary sinuses bilaterally with debris posteriorly on the right. There is a fluid level in the posterior left ethmoid air cells. Mucosal thickening is asymmetric in the anterior right ethmoid and frontal sinuses. The mastoid air cells are clear.  MRA HEAD FINDINGS  The internal carotid arteries are within normal limits from the high cervical segments through the ICA termini bilaterally. The A1 and M1 segments are normal. A focal stenosis is present with and a proximal anterior right M3 branch. There severe stenoses within the proximal M2 branches on the left. This results in significant differential attenuation of MCA branch vessels. Distal small vessel  disease is present bilaterally.  The left vertebral artery is slightly dominant to the right. The PICA origins are visualized and normal bilaterally. The vertebrobasilar junction is normal. The basilar artery is normal. Both posterior cerebral arteries originate from the basilar tip. There is some segmental attenuation of PCA branch vessels bilaterally without a significant proximal stenosis.  IMPRESSION: 1. Acute 2 cm infarct within the anterior aspect of the left lentiform nucleus, internal capsule, and corona radiata with evidence of petechial hemorrhage. 2. The infarct adjacent to the right lateral ventricle represents a remote nonhemorrhagic infarct. 3. Extensive periventricular and subcortical white matter disease bilaterally likely reflects the sequelae of chronic microvascular ischemia. 4. Remote lacunar infarct in the posterior right lentiform nucleus. 5. High-grade stenoses within the proximal MCA branch vessels bilaterally. 6. Moderate distal small vessel disease. 7. Scattered bilateral sinus disease. Critical Value/emergent results were called by telephone at the time of interpretation on 06/28/2013 at 8:31 AM to Dr. Candiss Norse , who verbally acknowledged these results.  Electronically Signed   By: Lawrence Santiago M.D.   On: 06/28/2013 08:32   Mr Brain Wo Contrast  06/28/2013   CLINICAL DATA:  Slurred speech.  Gait imbalance.  Stroke.  EXAM: MRI HEAD WITHOUT CONTRAST  MRA HEAD WITHOUT CONTRAST  TECHNIQUE: Multiplanar, multiecho pulse sequences of the brain and surrounding structures were obtained without intravenous contrast. Angiographic images of the head were obtained using MRA technique without contrast.  COMPARISON:  CT head without contrast 06/27/2013.  FINDINGS: MRI HEAD FINDINGS  An acute 2 cm infarct is present within the anterior aspect of the left lentiform nucleus, internal capsule, and corona radiata. T1 shortening within the infarct suggests acute petechial hemorrhage.  The area adjacent to  the right lateral ventricle represents a remote nonhemorrhagic infarct.  Extensive periventricular and subcortical white matter changes are present bilaterally. An additional remote lacunar infarct is present within the posterior aspect of the right lentiform nucleus. There are T2 changes associated with the acute infarct.  Flow is present in the major intracranial arteries. The globes and orbits are intact. Circumferential mucosal thickening is present in the maxillary sinuses bilaterally with debris posteriorly on the right. There is a fluid level in the posterior left ethmoid air cells. Mucosal thickening is asymmetric in the anterior right ethmoid and frontal sinuses. The mastoid air cells are clear.  MRA HEAD FINDINGS  The internal carotid arteries are within normal limits from the high cervical segments through the ICA termini bilaterally. The A1 and M1 segments are normal. A focal stenosis is present with and a proximal anterior right M3 branch. There severe stenoses within the proximal M2 branches on the left. This results in significant differential attenuation of MCA branch vessels. Distal small vessel disease is present bilaterally.  The left vertebral artery is slightly dominant to the right. The PICA origins are visualized and normal bilaterally. The vertebrobasilar junction is normal. The basilar artery is normal. Both posterior cerebral arteries originate from the basilar tip. There is some segmental attenuation of PCA branch vessels bilaterally without a significant proximal stenosis.  IMPRESSION: 1. Acute 2 cm infarct within the anterior aspect of the left lentiform nucleus, internal capsule, and corona radiata with evidence of petechial hemorrhage. 2. The infarct adjacent to the right lateral ventricle represents a remote nonhemorrhagic infarct. 3. Extensive periventricular and subcortical white matter disease bilaterally likely reflects the sequelae of chronic microvascular ischemia. 4. Remote  lacunar infarct in the posterior right lentiform nucleus. 5. High-grade stenoses within the proximal MCA branch vessels bilaterally. 6. Moderate distal small vessel disease. 7. Scattered bilateral sinus disease. Critical Value/emergent results were called by telephone at the time of interpretation on 06/28/2013 at 8:31 AM to Dr. Candiss Norse , who verbally acknowledged these results.   Electronically Signed   By: Lawrence Santiago M.D.   On: 06/28/2013 08:32    CT of the brain   IMPRESSION:  Right frontal periventricular white matter hypodensity - likely  chronic ischemic change or age indeterminate infarct (probably  subacute to remote). Correlate clinically.  No other acute intracranial abnormality identified.  MRI of the brain, MRA head IMPRESSION:  1. Acute 2 cm infarct within the anterior aspect of the left  lentiform nucleus, internal capsule, and corona radiata with  evidence of petechial hemorrhage.  2. The infarct adjacent to the right lateral ventricle represents a  remote nonhemorrhagic infarct.  3. Extensive periventricular and subcortical white matter disease  bilaterally likely reflects the sequelae of chronic microvascular  ischemia.  4. Remote  lacunar infarct in the posterior right lentiform nucleus.  5. High-grade stenoses within the proximal MCA branch vessels  bilaterally.  6. Moderate distal small vessel disease.  7. Scattered bilateral sinus disease   2D Echocardiogram    Carotid Doppler    CXR    EKG    Sinus rhythm with occasional Premature ventricular complexes Otherwise normal ECG  Therapy Recommendations pending  Physical Exam  General: The patient is alert and cooperative at the time of the examination.  Skin: No significant peripheral edema is noted.   Neurologic Exam  Mental status: The patient is oriented x 3.  Cranial nerves: Facial symmetry is present. Speech is normal, no aphasia or dysarthria is noted. Extraocular movements are full. Visual  fields are full.  Motor: The patient has good strength in all 4 extremities.  Sensory examination: Soft touch sensation is symmetric on the face, arms, and legs  Coordination: The patient has good finger-nose-finger and heel-to-shin bilaterally.  Gait and station: The gait was not tested.  Reflexes: Deep tendon reflexes are symmetric.    ASSESSMENT Billy Torres is a 58 y.o. male presenting with slurred speech. MRI confirms a left internal capsule and centrum semiovale infarct. Intracranial atherosclerosis is seen. The patient indicates that he was not on any antiplatelet agents prior to admission. He has near full resolution of his deficits. He was not a TPA candidate secondary to delay in seeking medical attention. The patient was found to have high blood sugars on admission, had no primary care physician prior to admission.   Diabetes  Left brain stroke  Dyslipidemia  Hospital day # 1  TREATMENT/PLAN  Continue Plavix  Carotid Doppler study  2-D echocardiogram  Physical and occupational therapy evaluation  Management of diabetes  Statin medication, LDL is 104   06/28/2013 10:23 AM  Margette Fast Lanny Hurst

## 2013-06-28 NOTE — Consult Note (Signed)
Referring Physician: Tyrell Antonio    Chief Complaint: Slurred speech  HPI: Billy Torres is an 58 y.o. male who is unable to provide much history.  He reports that his son brought him to the hospital with reports that his speech was slurred and has been so since the night prior to admission.  Although the patient has had some complaints of generalized weakness, no focal complaints were noted.  The patient has had some gait imbalance.  Patient was admitted for further evaluation.    Date last known well: Date: 06/26/2013 Time last known well: Unable to determine tPA Given: No: Outside treatment window  History reviewed. No pertinent past medical history.  Past Surgical History  Procedure Laterality Date  . Neck surgery      Family history: Mother with CAD and deceased from a MI  Social History:  reports that he has been smoking Cigarettes.  He has been smoking about 1.00 pack per day. He does not have any smokeless tobacco history on file. He reports that he does not drink alcohol or use illicit drugs.  Allergies: No Known Allergies  Medications:  I have reviewed the patient's current medications. Prior to Admission:  No prescriptions prior to admission   Scheduled: . aspirin  325 mg Oral Daily  . enoxaparin (LOVENOX) injection  40 mg Subcutaneous Q24H  . insulin aspart  0-9 Units Subcutaneous TID WC    ROS: History obtained from the patient  General ROS: negative for - chills, fatigue, fever, night sweats, weight gain or weight loss Psychological ROS: negative for - behavioral disorder, hallucinations, memory difficulties, mood swings or suicidal ideation Ophthalmic ROS: negative for - blurry vision, double vision, eye pain or loss of vision ENT ROS: negative for - epistaxis, nasal discharge, oral lesions, sore throat, tinnitus or vertigo Allergy and Immunology ROS: negative for - hives or itchy/watery eyes Hematological and Lymphatic ROS: negative for - bleeding problems,  bruising or swollen lymph nodes Endocrine ROS: negative for - galactorrhea, hair pattern changes, polydipsia/polyuria or temperature intolerance Respiratory ROS: negative for - cough, hemoptysis, shortness of breath or wheezing Cardiovascular ROS: negative for - chest pain, dyspnea on exertion, edema or irregular heartbeat Gastrointestinal ROS: negative for - abdominal pain, diarrhea, hematemesis, nausea/vomiting or stool incontinence Genito-Urinary ROS: negative for - dysuria, hematuria, incontinence or urinary frequency/urgency Musculoskeletal ROS: negative for - joint swelling or muscular weakness Neurological ROS: as noted in HPI Dermatological ROS: negative for rash and skin lesion changes  Physical Examination: Blood pressure 149/73, pulse 65, temperature 97.7 F (36.5 C), temperature source Oral, resp. rate 16, height 5\' 7"  (1.702 m), weight 74.844 kg (165 lb), SpO2 99.00%.  Neurologic Examination: Mental Status: Alert, oriented, thought content appropriate.  Speech fluent without evidence of aphasia but mild dysarthria.  Able to follow 3 step commands without difficulty. Cranial Nerves: II: Discs flat bilaterally; Visual fields grossly normal, pupils equal, round, reactive to light and accommodation III,IV, VI: ptosis not present, extra-ocular motions intact bilaterally V,VII: decrease in the right NLF, facial light touch sensation normal bilaterally VIII: hearing normal bilaterally IX,X: gag reflex present XI: bilateral shoulder shrug XII: midline tongue extension Motor: Right : Upper extremity   5/5    Left:     Upper extremity   5/5  Lower extremity   5/5     Lower extremity   5/5 Tone and bulk:normal tone throughout; no atrophy noted Sensory: Pinprick and light touch intact throughout, bilaterally Deep Tendon Reflexes: 2+ in the upper extremities and  absent in the lower extremities.  Plantars: Right: downgoing   Left: downgoing Cerebellar: normal finger-to-nose and  normal heel-to-shin test Gait: Unable to test CV: pulses palpable throughout     Laboratory Studies:  Basic Metabolic Panel:  Recent Labs Lab 06/27/13 1742 06/27/13 2033  NA 141 137  K 4.0 4.3  CL 101 101  CO2  --  24  GLUCOSE 303* 219*  BUN 9 9  CREATININE 1.00 0.84  CALCIUM  --  8.8    Liver Function Tests: No results found for this basename: AST, ALT, ALKPHOS, BILITOT, PROT, ALBUMIN,  in the last 168 hours No results found for this basename: LIPASE, AMYLASE,  in the last 168 hours No results found for this basename: AMMONIA,  in the last 168 hours  CBC:  Recent Labs Lab 06/27/13 1722 06/27/13 1742  WBC 9.4  --   NEUTROABS 5.5  --   HGB 14.4 16.3  HCT 43.2 48.0  MCV 82.3  --   PLT 241  --     Cardiac Enzymes: No results found for this basename: CKTOTAL, CKMB, CKMBINDEX, TROPONINI,  in the last 168 hours  BNP: No components found with this basename: POCBNP,   CBG:  Recent Labs Lab 06/27/13 1947 06/27/13 2152  GLUCAP 207* 65    Microbiology: No results found for this or any previous visit.  Coagulation Studies: No results found for this basename: LABPROT, INR,  in the last 72 hours  Urinalysis: No results found for this basename: COLORURINE, APPERANCEUR, LABSPEC, PHURINE, GLUCOSEU, HGBUR, BILIRUBINUR, KETONESUR, PROTEINUR, UROBILINOGEN, NITRITE, LEUKOCYTESUR,  in the last 168 hours  Lipid Panel: No results found for this basename: chol, trig, hdl, cholhdl, vldl, ldlcalc    HgbA1C:  No results found for this basename: HGBA1C    Urine Drug Screen:   No results found for this basename: labopia, cocainscrnur, labbenz, amphetmu, thcu, labbarb    Alcohol Level: No results found for this basename: ETH,  in the last 168 hours  Other results: EKG: normal sinus rhythm at 66 bpm, occasional PVC noted.  Imaging: Ct Head Wo Contrast  06/27/2013   CLINICAL DATA:  58 year old male with aphasia and ataxia.  EXAM: CT HEAD WITHOUT CONTRAST  TECHNIQUE:  Contiguous axial images were obtained from the base of the skull through the vertex without intravenous contrast.  COMPARISON:  None.  FINDINGS: A 1 cm focal hypodensity in the right frontal periventricular white matter is indeterminate but most likely represents chronic ischemic changes or possibly an age indeterminate white matter infarct.  No acute intracranial abnormalities are identified, including mass lesion or mass effect, hydrocephalus, extra-axial fluid collection, midline shift, hemorrhage, or acute infarction.  The visualized bony calvarium is unremarkable.  IMPRESSION: Right frontal periventricular white matter hypodensity - likely chronic ischemic change or age indeterminate infarct (probably subacute to remote). Correlate clinically.  No other acute intracranial abnormality identified.   Electronically Signed   By: Hassan Rowan M.D.   On: 06/27/2013 21:37    Assessment: 58 y.o. male presenting with dysarthria and gait imbalance.  Head CT performed and shows an area of hypodensity at the right frontal periventricular region.  This does not appear acute and is not consistent with the patient's complaints.  Patient started on an aspirin a day.  Further work up recommended.    Stroke Risk Factors - smoking  Plan: 1. HgbA1c, fasting lipid panel 2. MRI, MRA  of the brain without contrast 3. PT consult, OT consult, Speech consult 4. Echocardiogram  5. Carotid dopplers 6. Prophylactic therapy-Antiplatelet med: Aspirin - dose 325mg  daily 7. Risk factor modification 8. Telemetry monitoring 9. Frequent neuro checks   Alexis Goodell, MD [facility] [phone] 06/28/2013, 12:00 AM

## 2013-06-28 NOTE — Care Management Note (Signed)
    Page 1 of 2   06/29/2013     10:53:00 AM CARE MANAGEMENT NOTE 06/29/2013  Patient:  Billy Torres, Billy Torres   Account Number:  [id number]  Date Initiated:  06/28/2013  Documentation initiated by:  Lorne Skeens  Subjective/Objective Assessment:   Patient was admitted with slurred speech. Lives at home alone.     Action/Plan:   Will follow for discharge needs pending PT/OT evals and physician orders.  Consult recieved for assistance finding a PCP.   Anticipated DC Date:  06/29/2013   Anticipated DC Plan:  [place]  CM consult  [facility]  [facility]  OP Neuro Rehab      Choice offered to / List presented to:  C-1 Patient           Status of service:  Completed, signed off Medicare Important Message given?   (If response is "NO", the following Medicare IM given date fields will be blank) Date Medicare IM given:   Date Additional Medicare IM given:    Discharge Disposition:  HOME/SELF CARE  Per UR Regulation:  Reviewed for med. necessity/level of care/duration of stay  If discussed at [place] of Stay Meetings, dates discussed:    Comments:  06/29/13 [place], MSN, CM- CM again met with patient to discuss discharge needs. CM was still unable to obtain an appointment for a PCP, but patient will be recieving a call from Dr Zigmund Daniel' office ([facility]) with an appointment date/time.  Patient was again encouraged to do a walk-in appointment at [facility] at any time, as the appointment will likely be at the end of July or beginning of August.  Patient was referred the the [facility] pharmacy to fill out paperwork for financial assistance with medications.  Phone numbers and addresses for both clinics, fiancial assistance and pharmacy were provided in writing and also added to the AVS for discharge.  [place] letter was provided and patient was informed that the medications should be filled immediately upon discharge, as  the letter will expire on 07/06/13.  Patient was encourage to start the orange card process for medication assistance at [facility] as soon as possible so assistance will be in place prior to [facility] medications running out.  Patient was also given information on Outpatient Neuro Rehab, as outpatient OT was recommended.  Patient is unsure if he will be able to do it, but has requested that referral be made. Information was provided and added to AVS.  Dr Candiss Norse and patient's RN are aware of plans.   06/28/13 [place], MSN, CM- CM met with patient to discuss finding a PCP.  Patient is currently without insurance.  The [place], [place], and [facility] are all 6-8 weeks out for scheduling new patient appointments.  Because of this, patient was provided with the [facility] brochure and encourage to arrive at the clinic as a walk-in to establish care, as those appointments are more readily available at this time. CM will meet with patient again to discuss medication needs once discharge medication list has been established. Patient states that he may have difficulty affording his medications, depending on the price.  CM will continue to follow.

## 2013-06-28 NOTE — Progress Notes (Signed)
Inpatient Diabetes Program Recommendations  AACE/ADA: New Consensus Statement on Inpatient Glycemic Control (2013)  Target Ranges:  Prepandial:   less than 140 mg/dL      Peak postprandial:   less than 180 mg/dL (1-2 hours)      Critically ill patients:  140 - 180 mg/dL   Reason for Visit: New Onset Diabete-- MD Consult  Diabetes history: Mother and multiple siblings with diabetes Outpatient Diabetes medications: N/A Current orders for Inpatient glycemic control: Lantus 8 units and Novolog sensitive Correction scale.    Note:  Patient responding well to low doses of insulin.  A1C still pending.  If insulin is required at discharge, perhaps it can be short-term.  Patient lives with son and son's girlfriend.  Talked with patient regarding diabetes in general.  Patient states that he realizes that diabetes is very "serious".  Indicates willingness to learn as much as he can about it and control it.  If patient is approved for the "orange card" at the [facility], is agreeable to attend education sessions [facility].  Gave him a DVD regarding diabetes to take home with him-- placed it in his belongings bag.  Will place referral to [facility].  Patient understands regarding seeking care at the [facility] as a "walk-in".    Gave patient information regarding purchasing a generic glucose meter at [employer] if unable to get one through [facility].  Placed handout in his belongings bag to take home.  Talked with patient in general terms regarding generic insulins available at [facility] in case there is a problem getting insulin for home through Case [facility] or [place].  Will follow-up tomorrow when specific needs for discharge may be better known.  Patient rather reserved, but receptive to my visit.  Will follow-up tomorrow.  Have asked nursing staff to provide patient basic survival skill diabetes education and instruct regarding insulin preparation and administration should he need it at  discharge.  Thank you.  Patti S. Marcelline Mates, RN, CNS, CDE Inpatient Diabetes Program, team pager [phone]

## 2013-06-28 NOTE — Progress Notes (Signed)
SLP Cancellation Note  Patient Details Name: Billy Torres MRN: [id number] DOB: 1955-12-25   Cancelled treatment:       Reason Eval/Treat Not Completed:  (passed RN stroke swallow screen). Per RN, tolerating diet and deficits have resolved. Defer formal eval per protocol. Please reconsult if needed.  [place], CCC-SLP [phone]    [name] 06/28/2013, 11:53 AM

## 2013-06-28 NOTE — Progress Notes (Signed)
*  PRELIMINARY RESULTS* Vascular Ultrasound Carotid Duplex (Doppler) has been completed.  Findings suggest 1-39% internal carotid artery stenosis bilaterally. Vertebral arteries are patent with antegrade flow.  06/28/2013 8:42 AM Maudry Mayhew, RVT, RDCS, RDMS

## 2013-06-28 NOTE — Progress Notes (Signed)
Inpatient Diabetes Program Recommendations  AACE/ADA: New Consensus Statement on Inpatient Glycemic Control (2013)  Target Ranges:  Prepandial:   less than 140 mg/dL      Peak postprandial:   less than 180 mg/dL (1-2 hours)      Critically ill patients:  140 - 180 mg/dL   Reason for Visit: MD Consult  Diabetes history: None.  Most of his family reportedly has diabetes Outpatient Diabetes medications: N/A Current orders for Inpatient glycemic control: Lantus 8 units daily, Sensitive correction scale  Note:  Diabetes Coordinator referral received.  Note that Case Management has seen.  Patient encouraged to be a "walk-in" at the [facility] to establish care there after discharge.  Assume patient will apply for "orange card" assistance at [facility].  Discharge medications unknown at present and A1C pending, but MD anticipating patient will need insulin at discharge.  If patient unable to get discharge insulin filled through Case Management, generic NPH, Regular, and 70/30 insulins are available at [facility] for $24.88/vial.  If patient unable to get strips through the [facility], can purchase generic meter from [facility].  Patient has just received lunch tray.  Advised him that I will return later to discuss diabetes with him.  Will order the following:  Dietitian Consult  Living Well with Diabetes  Insulin Starter Kit  Thank you.  Patti S. Marcelline Mates, RN, CNS, CDE Inpatient Diabetes Program, team pager [phone]

## 2013-06-29 ENCOUNTER — Telehealth: Payer: Self-pay

## 2013-06-29 DIAGNOSIS — F17200 Nicotine dependence, unspecified, uncomplicated: Secondary | ICD-10-CM | POA: Diagnosis present

## 2013-06-29 DIAGNOSIS — E785 Hyperlipidemia, unspecified: Secondary | ICD-10-CM | POA: Diagnosis present

## 2013-06-29 DIAGNOSIS — I639 Cerebral infarction, unspecified: Secondary | ICD-10-CM | POA: Diagnosis present

## 2013-06-29 LAB — GLUCOSE, CAPILLARY
Glucose-Capillary: 166 mg/dL — ABNORMAL HIGH (ref 70–99)
Glucose-Capillary: 236 mg/dL — ABNORMAL HIGH (ref 70–99)

## 2013-06-29 MED ORDER — FREESTYLE SYSTEM KIT: 1.0000 | PACK | Freq: Three times a day (TID) | Status: DC

## 2013-06-29 MED ORDER — INSULIN GLARGINE 100 UNIT/ML ~~LOC~~ SOLN: 10.0000 [IU] | Freq: Every day | SUBCUTANEOUS | Status: DC

## 2013-06-29 MED ORDER — INSULIN ASPART 100 UNIT/ML ~~LOC~~ SOLN: SUBCUTANEOUS | Status: DC

## 2013-06-29 MED ORDER — ATORVASTATIN CALCIUM 20 MG PO TABS: 20.0000 mg | ORAL_TABLET | Freq: Every day | ORAL | Status: DC

## 2013-06-29 MED ORDER — CLOPIDOGREL BISULFATE 75 MG PO TABS: 75.0000 mg | ORAL_TABLET | Freq: Every day | ORAL | Status: DC

## 2013-06-29 NOTE — Telephone Encounter (Signed)
Nsg Mgr contacted office to set up a Est. Care/Hospital F/U for above named Pt. Pt as well as Case Mgr are fully aware of the wait time for NP Appointment.Pt is self pay at this time.

## 2013-06-29 NOTE — Progress Notes (Signed)
Per NT, pt has been non-compliant with bed alarm and fall prevention safety plan.  NT notified charge RN, pt was re-educated about policies.

## 2013-06-29 NOTE — Progress Notes (Signed)
Utilization review completed.  

## 2013-06-29 NOTE — Progress Notes (Signed)
06/29/13 8:45 W. Stann Mainland RN BSN NCM ED CM rceived call from patient he initially reported that his medications were left in the pharmacy which closed at 6p. He handed the phone to his daughterlaw, she explained that the patient was discharged after 6p tonight and received a [facility] letter for his medications and glucometer that he was told to pick up at the [facility]. Pt was calling because he wanted to find out if he could pick it up tonight. I explained that he could redeem Medications from any of the participating pharmacies on the list, but the meter he may want to get at the [facility] unless he was able to afford the strips which are $80 for 50 strips as per [facility] pharmacist. Pt has decided that he will take [facility] letter [facility] tonight. Pt encouraged to contact me if he should have an issue with the [facility] letter or glucometer.

## 2013-06-29 NOTE — Progress Notes (Signed)
Inpatient Diabetes Program Recommendations  AACE/ADA: New Consensus Statement on Inpatient Glycemic Control (2013)  Target Ranges:  Prepandial:   less than 140 mg/dL      Peak postprandial:   less than 180 mg/dL (1-2 hours)      Critically ill patients:  140 - 180 mg/dL   Reason for Assessment: Follow-up from consult yesterday  Results for CLANCE, BAQUERO (MRN [id number]) as of 06/29/2013 09:40  Ref. Range 06/28/2013 09:21 06/28/2013 11:55 06/28/2013 16:15 06/28/2013 22:18 06/29/2013 06:56  Glucose-Capillary Latest Range: 70-99 mg/dL 221 (H) 175 (H) 219 (H) 139 (H) 166 (H)   Note:  Received only a total of 16 units insulin yesterday- Lantus plus correction.  Note A1C of 10.0.  Patient responded very well to insulin.  Do not see any contraindications to use of Metformin in this patient.  Would recommend patient be discharged home on Metformin.  If patient is discharged home on insulin, anticipate that it would be short-term-- provided patient makes appropriate lifestyle changes.      If case management is able to provide a vial of Lantus, patient could take low dose daily (doubt he would need more than 8 units) plus metformin, and perhaps Lantus could be stopped by PCP at [facility].  Thank you.  Patti S. Marcelline Mates, RN, CNS, CDE Inpatient Diabetes Program, team pager [phone]

## 2013-06-29 NOTE — Discharge Instructions (Signed)
Follow with Primary MD  in 7 days   Get CBC, CMP  checked  by Primary MD next visit.   Accuchecks 4 times/day, Once in AM empty stomach and then before each meal. Log in all results and show them to your Prim.MD in 3 days. If any glucose reading is under 80 or above 300 call your Prim MD immidiately. Follow Low glucose instructions for glucose under 80 as instructed.   Activity: As tolerated with Full fall precautions use walker/cane & assistance as needed   Disposition Home     Diet: Heart Healthy Low carb.  For Heart failure patients - Check your Weight same time everyday, if you gain over 2 pounds, or you develop in leg swelling, experience more shortness of breath or chest pain, call your Primary MD immediately. Follow Cardiac Low Salt Diet and 1.8 lit/day fluid restriction.   On your next visit with her primary care physician please Get Medicines reviewed and adjusted.  Please request your Prim.MD to go over all Hospital Tests and Procedure/Radiological results at the follow up, please get all Hospital records sent to your Prim MD by signing hospital release before you go home.   If you experience worsening of your admission symptoms, develop shortness of breath, life threatening emergency, suicidal or homicidal thoughts you must seek medical attention immediately by calling 911 or calling your MD immediately  if symptoms less severe.  You Must read complete instructions/literature along with all the possible adverse reactions/side effects for all the Medicines you take and that have been prescribed to you. Take any new Medicines after you have completely understood and accpet all the possible adverse reactions/side effects.   Do not drive and provide baby sitting services if your were admitted for syncope or siezures until you have seen by Primary MD or a Neurologist and advised to do so again.  Do not drive when taking Pain medications.    Do not take more than prescribed  Pain, Sleep and Anxiety Medications  Special Instructions: If you have smoked or chewed Tobacco  in the last 2 yrs please stop smoking, stop any regular Alcohol  and or any Recreational drug use.  Wear Seat belts while driving.   Please note Please follow-up with the [facility] as soon as possible to fill out medication assistance paperwork.  [place], [place] [address] [phone] or [phone]. Call for appointment, or walk in.  [place] [facility] [place]  [facility] medication assistance letter provided, expires on 07/06/13. Please have medications filled immediately at discharge.   You were cared for by a hospitalist during your hospital stay. If you have any questions about your discharge medications or the care you received while you were in the hospital after you are discharged, you can call the unit and asked to speak with the hospitalist on call if the hospitalist that took care of you is not available. Once you are discharged, your primary care physician will handle any further medical issues. Please note that NO REFILLS for any discharge medications will be authorized once you are discharged, as it is imperative that you return to your primary care physician (or establish a relationship with a primary care physician if you do not have one) for your aftercare needs so that they can reassess your need for medications and monitor your lab values.

## 2013-06-29 NOTE — Progress Notes (Signed)
Discharge instruction given to pt pt to call for MDfollow up appointment ans instructed. Prescription given to pt and all medication instruction given pt demonstrated good understanding with a  teach back self administration of insulin . D/c home to self care. Condition at discharge is stable. Pt awaiting son for pick up. Stroke education and diabetes teaching reinforced.

## 2013-06-29 NOTE — Discharge Summary (Signed)
Billy Torres, is a 58 y.o. male  DOB Jun 09, 1955  MRN [id number].  Admission date:  06/27/2013  Admitting Physician  Elmarie Shiley, MD  Discharge Date:  06/29/2013   Primary MD  No PCP Per Patient  Recommendations for primary care physician for things to follow:   Secondary factors for CVA, monitor A1c in glycemic control closely.   Admission Diagnosis  Transient cerebral ischemia, unspecified transient cerebral ischemia type [435.9] New onset type 2 diabetes mellitus [250.00]   Discharge Diagnosis      CVA , newly diagnosed type 2 diabetes mellitus, smoking  Principal Problem:   CVA (cerebral infarction) Active Problems:   Diabetes   Smoking      History reviewed. No pertinent past medical history.  Past Surgical History  Procedure Laterality Date  . Neck surgery       Discharge Condition: Stable   Follow UP  Follow-up Information   Follow up with PCP as per Case manager. Schedule an appointment as soon as possible for a visit in 1 week.      Follow up with [facility]. Schedule an appointment as soon as possible for a visit in 1 month.   Contact information:   [address]     [place] [address] [phone]      Follow up with SPENCER,MICHAEL A, MD. Schedule an appointment as soon as possible for a visit in 1 week.   Specialty:  Ophthalmology   Contact information:   [place] [address] [phone]         Discharge Instructions  and  Discharge Medications          Discharge Instructions   Ambulatory referral to Nutrition and Diabetic Education    Complete by:  As directed   New onset diabetes.  To go to [facility] as a "walk in" after discharge to establish care.  Strong family history of diabetes.  Patient to apply  for [facility].  Please contact to schedule appointment after discharge.  [phone]  Thank you.     Discharge instructions    Complete by:  As directed   Follow with Primary MD  in 7 days   Get CBC, CMP  checked  by Primary MD next visit.   Accuchecks 4 times/day, Once in AM empty stomach and then before each meal. Log in all results and show them to your Prim.MD in 3 days. If any glucose reading is under 80 or above 300 call your Prim MD immidiately. Follow Low glucose instructions for glucose under 80 as instructed.   Activity: As tolerated with Full fall precautions use walker/cane & assistance as needed   Disposition Home     Diet: Heart Healthy Low carb.  For Heart failure patients - Check your Weight same time everyday, if you gain over 2 pounds, or you develop in leg swelling, experience more shortness of breath or chest pain, call your Primary MD immediately. Follow Cardiac Low Salt Diet  and 1.8 lit/day fluid restriction.   On your next visit with her primary care physician please Get Medicines reviewed and adjusted.  Please request your Prim.MD to go over all Hospital Tests and Procedure/Radiological results at the follow up, please get all Hospital records sent to your Prim MD by signing hospital release before you go home.   If you experience worsening of your admission symptoms, develop shortness of breath, life threatening emergency, suicidal or homicidal thoughts you must seek medical attention immediately by calling 911 or calling your MD immediately  if symptoms less severe.  You Must read complete instructions/literature along with all the possible adverse reactions/side effects for all the Medicines you take and that have been prescribed to you. Take any new Medicines after you have completely understood and accpet all the possible adverse reactions/side effects.   Do not drive and provide baby sitting services if your were admitted for syncope or siezures until  you have seen by Primary MD or a Neurologist and advised to do so again.  Do not drive when taking Pain medications.    Do not take more than prescribed Pain, Sleep and Anxiety Medications  Special Instructions: If you have smoked or chewed Tobacco  in the last 2 yrs please stop smoking, stop any regular Alcohol  and or any Recreational drug use.  Wear Seat belts while driving.   Please note  You were cared for by a hospitalist during your hospital stay. If you have any questions about your discharge medications or the care you received while you were in the hospital after you are discharged, you can call the unit and asked to speak with the hospitalist on call if the hospitalist that took care of you is not available. Once you are discharged, your primary care physician will handle any further medical issues. Please note that NO REFILLS for any discharge medications will be authorized once you are discharged, as it is imperative that you return to your primary care physician (or establish a relationship with a primary care physician if you do not have one) for your aftercare needs so that they can reassess your need for medications and monitor your lab values.  Follow with Primary MD  in 7 days   Get CBC, CMP  checked  by Primary MD next visit.   Accuchecks 4 times/day, Once in AM empty stomach and then before each meal. Log in all results and show them to your Prim.MD in 3 days. If any glucose reading is under 80 or above 300 call your Prim MD immidiately. Follow Low glucose instructions for glucose under 80 as instructed.   Activity: As tolerated with Full fall precautions use walker/cane & assistance as needed   Disposition Home     Diet: Heart Healthy Low carb.  For Heart failure patients - Check your Weight same time everyday, if you gain over 2 pounds, or you develop in leg swelling, experience more shortness of breath or chest pain, call your Primary MD immediately. Follow  Cardiac Low Salt Diet and 1.8 lit/day fluid restriction.   On your next visit with her primary care physician please Get Medicines reviewed and adjusted.  Please request your Prim.MD to go over all Hospital Tests and Procedure/Radiological results at the follow up, please get all Hospital records sent to your Prim MD by signing hospital release before you go home.   If you experience worsening of your admission symptoms, develop shortness of breath, life threatening emergency, suicidal or homicidal thoughts  you must seek medical attention immediately by calling 911 or calling your MD immediately  if symptoms less severe.  You Must read complete instructions/literature along with all the possible adverse reactions/side effects for all the Medicines you take and that have been prescribed to you. Take any new Medicines after you have completely understood and accpet all the possible adverse reactions/side effects.   Do not drive and provide baby sitting services if your were admitted for syncope or siezures until you have seen by Primary MD or a Neurologist and advised to do so again.  Do not drive when taking Pain medications.    Do not take more than prescribed Pain, Sleep and Anxiety Medications  Special Instructions: If you have smoked or chewed Tobacco  in the last 2 yrs please stop smoking, stop any regular Alcohol  and or any Recreational drug use.  Wear Seat belts while driving.   Please note  You were cared for by a hospitalist during your hospital stay. If you have any questions about your discharge medications or the care you received while you were in the hospital after you are discharged, you can call the unit and asked to speak with the hospitalist on call if the hospitalist that took care of you is not available. Once you are discharged, your primary care physician will handle any further medical issues. Please note that NO REFILLS for any discharge medications will be  authorized once you are discharged, as it is imperative that you return to your primary care physician (or establish a relationship with a primary care physician if you do not have one) for your aftercare needs so that they can reassess your need for medications and monitor your lab values.     Increase activity slowly    Complete by:  As directed             Medication List         atorvastatin 20 MG tablet  Commonly known as:  LIPITOR  Take 1 tablet (20 mg total) by mouth daily at 6 PM.     clopidogrel 75 MG tablet  Commonly known as:  PLAVIX  Take 1 tablet (75 mg total) by mouth daily with breakfast.     glucose monitoring kit monitoring kit  1 each by Does not apply route 4 (four) times daily - after meals and at bedtime. 1 month Diabetic Testing Supplies for QAC-QHS accuchecks.Any brand OK     insulin aspart 100 UNIT/ML injection  Commonly known as:  novoLOG  - Before each meal 3 times a day, 140-199 - 2 units, 200-250 - 4 units, 251-299 - 6 units,  300-349 - 8 units,  350 or above 10 units.  - Insulin PEN if approved, provide syringes and needles if needed.     insulin glargine 100 UNIT/ML injection  Commonly known as:  LANTUS  Inject 0.1 mLs (10 Units total) into the skin daily. Dispense syringes and needles as needed, lantus PEN if qualifies. 1 month supply 3 refills          Diet and Activity recommendation: See Discharge Instructions above   Consults obtained - Neuro, DM educator   Major procedures and Radiology Reports - PLEASE review detailed and final reports for all details, in brief -     Carotid US  Findings suggest 1-39% internal carotid artery stenosis bilaterally. Vertebral arteries are patent with antegrade flow.     Echo  Left ventricle: The cavity size was normal. Wall  thickness was normal. Systolic function was normal. The estimated ejection fraction was in the range of 60% to 65%.    Ct Head Wo Contrast  06/27/2013   CLINICAL DATA:   58 year old male with aphasia and ataxia.  EXAM: CT HEAD WITHOUT CONTRAST  TECHNIQUE: Contiguous axial images were obtained from the base of the skull through the vertex without intravenous contrast.  COMPARISON:  None.  FINDINGS: A 1 cm focal hypodensity in the right frontal periventricular white matter is indeterminate but most likely represents chronic ischemic changes or possibly an age indeterminate white matter infarct.  No acute intracranial abnormalities are identified, including mass lesion or mass effect, hydrocephalus, extra-axial fluid collection, midline shift, hemorrhage, or acute infarction.  The visualized bony calvarium is unremarkable.  IMPRESSION: Right frontal periventricular white matter hypodensity - likely chronic ischemic change or age indeterminate infarct (probably subacute to remote). Correlate clinically.  No other acute intracranial abnormality identified.   Electronically Signed   By: Hassan Rowan M.D.   On: 06/27/2013 21:37   Mr Jodene Nam Head Wo Contrast  06/28/2013   CLINICAL DATA:  Slurred speech.  Gait imbalance.  Stroke.  EXAM: MRI HEAD WITHOUT CONTRAST  MRA HEAD WITHOUT CONTRAST  TECHNIQUE: Multiplanar, multiecho pulse sequences of the brain and surrounding structures were obtained without intravenous contrast. Angiographic images of the head were obtained using MRA technique without contrast.  COMPARISON:  CT head without contrast 06/27/2013.  FINDINGS: MRI HEAD FINDINGS  An acute 2 cm infarct is present within the anterior aspect of the left lentiform nucleus, internal capsule, and corona radiata. T1 shortening within the infarct suggests acute petechial hemorrhage.  The area adjacent to the right lateral ventricle represents a remote nonhemorrhagic infarct.  Extensive periventricular and subcortical white matter changes are present bilaterally. An additional remote lacunar infarct is present within the posterior aspect of the right lentiform nucleus. There are T2 changes associated  with the acute infarct.  Flow is present in the major intracranial arteries. The globes and orbits are intact. Circumferential mucosal thickening is present in the maxillary sinuses bilaterally with debris posteriorly on the right. There is a fluid level in the posterior left ethmoid air cells. Mucosal thickening is asymmetric in the anterior right ethmoid and frontal sinuses. The mastoid air cells are clear.  MRA HEAD FINDINGS  The internal carotid arteries are within normal limits from the high cervical segments through the ICA termini bilaterally. The A1 and M1 segments are normal. A focal stenosis is present with and a proximal anterior right M3 branch. There severe stenoses within the proximal M2 branches on the left. This results in significant differential attenuation of MCA branch vessels. Distal small vessel disease is present bilaterally.  The left vertebral artery is slightly dominant to the right. The PICA origins are visualized and normal bilaterally. The vertebrobasilar junction is normal. The basilar artery is normal. Both posterior cerebral arteries originate from the basilar tip. There is some segmental attenuation of PCA branch vessels bilaterally without a significant proximal stenosis.  IMPRESSION: 1. Acute 2 cm infarct within the anterior aspect of the left lentiform nucleus, internal capsule, and corona radiata with evidence of petechial hemorrhage. 2. The infarct adjacent to the right lateral ventricle represents a remote nonhemorrhagic infarct. 3. Extensive periventricular and subcortical white matter disease bilaterally likely reflects the sequelae of chronic microvascular ischemia. 4. Remote lacunar infarct in the posterior right lentiform nucleus. 5. High-grade stenoses within the proximal MCA branch vessels bilaterally. 6. Moderate distal small vessel disease.  7. Scattered bilateral sinus disease. Critical Value/emergent results were called by telephone at the time of interpretation on  06/28/2013 at 8:31 AM to Dr. Candiss Norse , who verbally acknowledged these results.   Electronically Signed   By: Lawrence Santiago M.D.   On: 06/28/2013 08:32   Mr Brain Wo Contrast  06/28/2013   CLINICAL DATA:  Slurred speech.  Gait imbalance.  Stroke.  EXAM: MRI HEAD WITHOUT CONTRAST  MRA HEAD WITHOUT CONTRAST  TECHNIQUE: Multiplanar, multiecho pulse sequences of the brain and surrounding structures were obtained without intravenous contrast. Angiographic images of the head were obtained using MRA technique without contrast.  COMPARISON:  CT head without contrast 06/27/2013.  FINDINGS: MRI HEAD FINDINGS  An acute 2 cm infarct is present within the anterior aspect of the left lentiform nucleus, internal capsule, and corona radiata. T1 shortening within the infarct suggests acute petechial hemorrhage.  The area adjacent to the right lateral ventricle represents a remote nonhemorrhagic infarct.  Extensive periventricular and subcortical white matter changes are present bilaterally. An additional remote lacunar infarct is present within the posterior aspect of the right lentiform nucleus. There are T2 changes associated with the acute infarct.  Flow is present in the major intracranial arteries. The globes and orbits are intact. Circumferential mucosal thickening is present in the maxillary sinuses bilaterally with debris posteriorly on the right. There is a fluid level in the posterior left ethmoid air cells. Mucosal thickening is asymmetric in the anterior right ethmoid and frontal sinuses. The mastoid air cells are clear.  MRA HEAD FINDINGS  The internal carotid arteries are within normal limits from the high cervical segments through the ICA termini bilaterally. The A1 and M1 segments are normal. A focal stenosis is present with and a proximal anterior right M3 branch. There severe stenoses within the proximal M2 branches on the left. This results in significant differential attenuation of MCA branch vessels. Distal  small vessel disease is present bilaterally.  The left vertebral artery is slightly dominant to the right. The PICA origins are visualized and normal bilaterally. The vertebrobasilar junction is normal. The basilar artery is normal. Both posterior cerebral arteries originate from the basilar tip. There is some segmental attenuation of PCA branch vessels bilaterally without a significant proximal stenosis.  IMPRESSION: 1. Acute 2 cm infarct within the anterior aspect of the left lentiform nucleus, internal capsule, and corona radiata with evidence of petechial hemorrhage. 2. The infarct adjacent to the right lateral ventricle represents a remote nonhemorrhagic infarct. 3. Extensive periventricular and subcortical white matter disease bilaterally likely reflects the sequelae of chronic microvascular ischemia. 4. Remote lacunar infarct in the posterior right lentiform nucleus. 5. High-grade stenoses within the proximal MCA branch vessels bilaterally. 6. Moderate distal small vessel disease. 7. Scattered bilateral sinus disease. Critical Value/emergent results were called by telephone at the time of interpretation on 06/28/2013 at 8:31 AM to Dr. Candiss Norse , who verbally acknowledged these results.   Electronically Signed   By: Lawrence Santiago M.D.   On: 06/28/2013 08:32    Micro Results      No results found for this or any previous visit (from the past 240 hour(s)).   History of present illness and  Hospital Course:     Kindly see H&P for history of present illness and admission details, please review complete Labs, Consult reports and Test reports for all details in brief Billy Torres, is a 58 y.o. male, patient with history of undiagnosed type 2 diabetes mellitus, undiagnosed dyslipidemia,  ongoing smoking counseled to quit, was admitted to the hospital with chief complaints of slurred speech and some weakness, and was found to have an acute CVA.   1. CVA on MRI evidence of Acute 2 cm infarct within the  anterior aspect of the left lentiform nucleus, internal capsule, and corona radiata with evidence of petechial hemorrhage - underwent echo and carotid duplex, A1c and lipid panel showed undiagnosed dyslipidemia and type 2 diabetes mellitus, was seen by speech-PT and OT. Deficits have completely resolved, cleared by neuro to be discharged home on Plavix and statin for secondary prevention. Please monitor second is factors for stroke closely. We'll follow with PCP and urology post discharge.    2. Newly diagnosed dyslipidemia. Placed on statin.     3. DM type II newly diagnosed. A1c 10. Placed on Lantus and sliding-scale, will provide glucometer for q. a.c. at bedtime testing, written instructions given. Educated by diabetic educator.    4. Smoking patient counseled to quit.     Today   Subjective:   Billy Torres today has no headache,no chest abdominal pain,no new weakness tingling or numbness, feels much better wants to go home today.    Objective:   Blood pressure 116/73, pulse 66, temperature 98.6 F (37 C), temperature source Oral, resp. rate 16, height 5' 7"  (1.702 m), weight 74.844 kg (165 lb), SpO2 98.00%.   Intake/Output Summary (Last 24 hours) at 06/29/13 1025 Last data filed at 06/28/13 1314  Gross per 24 hour  Intake    240 ml  Output      0 ml  Net    240 ml    Exam Awake Alert, Oriented x 3, No new F.N deficits, Normal affect [place].AT,PERRAL Supple Neck,No JVD, No cervical lymphadenopathy appriciated.  Symmetrical Chest wall movement, Good air movement bilaterally, CTAB RRR,No Gallops,Rubs or new Murmurs, No Parasternal Heave +ve B.Sounds, Abd Soft, Non tender, No organomegaly appriciated, No rebound -guarding or rigidity. No Cyanosis, Clubbing or edema, No new Rash or bruise  Data Review   CBC w Diff: Lab Results  Component Value Date   WBC 9.4 06/27/2013   HGB 16.3 06/27/2013   HCT 48.0 06/27/2013   PLT 241 06/27/2013   LYMPHOPCT 32 06/27/2013    MONOPCT 6 06/27/2013   EOSPCT 3 06/27/2013   BASOPCT 0 06/27/2013    CMP: Lab Results  Component Value Date   NA 137 06/27/2013   K 4.3 06/27/2013   CL 101 06/27/2013   CO2 24 06/27/2013   BUN 9 06/27/2013   CREATININE 0.84 06/27/2013  . Lab Results  Component Value Date   CHOL 171 06/28/2013   HDL 34* 06/28/2013   LDLCALC 104* 06/28/2013   TRIG 164* 06/28/2013   CHOLHDL 5.0 06/28/2013    Lab Results  Component Value Date   HGBA1C 10.0* 06/27/2013    CBG (last 3)   Recent Labs  06/28/13 1615 06/28/13 2218 06/29/13 0656  GLUCAP 219* 139* 166*       Total Time in preparing paper work, data evaluation and todays exam - 35 minutes  Thurnell Lose M.D on 06/29/2013 at 10:25 AM  [facility] Office  [phone]   **Disclaimer: This note may have been dictated with voice recognition software. Similar sounding words can inadvertently be transcribed and this note may contain transcription errors which may not have been corrected upon publication of note.**

## 2013-06-29 NOTE — Progress Notes (Signed)
  RD consulted for nutrition education regarding diabetes.   Lab Results  Component Value Date   HGBA1C 10.0* 06/27/2013    RD provided "Carbohydrate Counting for People with Diabetes" handout from the Academy of Nutrition and Dietetics. Discussed different food groups and their effects on blood sugar, emphasizing carbohydrate-containing foods. Provided list of carbohydrates and recommended serving sizes of common foods.  Discussed importance of controlled and consistent carbohydrate intake throughout the day. Provided examples of ways to balance meals/snacks and encouraged intake of high-fiber, whole grain complex carbohydrates. Reviewed pt's dietary recall. Pt reports drinking [employer], Gatorade, and Juice all day long. RD highly encouraged eliminating sugary beverages; discussed why and suggested healthier alternative beverages. Teach back method used.  Expect fair compliance.  Body mass index is 25.84 kg/(m^2). Pt meets criteria for Overweight based on current BMI.  Current diet order is Carb Modified, patient is consuming approximately 100% of meals at this time. Labs and medications reviewed. No further nutrition interventions warranted at this time. RD contact information provided. If additional nutrition issues arise, please re-consult RD.  Pryor Ochoa RD, LDN Inpatient Clinical Dietitian Pager: [phone] After Hours Pager: [phone]

## 2013-06-29 NOTE — Progress Notes (Signed)
Stroke Team Progress Note  HISTORY Billy Torres is an 58 y.o. male who is unable to provide much history. He reports that his son brought him to the hospital with reports that his speech was slurred and has been so since the night prior to admission. Although the patient has had some complaints of generalized weakness, no focal complaints were noted. The patient has had some gait imbalance. Patient was admitted for further evaluation.  Date last known well: Date: 06/26/2013  Time last known well: Unable to determine  tPA Given: No: Outside treatment window     SUBJECTIVE   OBJECTIVE Most recent Vital Signs: Filed Vitals:   06/28/13 1313 06/28/13 1700 06/28/13 2215 06/29/13 0532  BP: 126/73 107/68 119/72 116/73  Pulse: 63 60 62 66  Temp:  99 F (37.2 C) 98.5 F (36.9 C) 98.6 F (37 C)  TempSrc: Oral Oral Oral Oral  Resp: 18 18 18 16   Height:      Weight:      SpO2: 99% 98% 98% 98%   CBG (last 3)   Recent Labs  06/28/13 1615 06/28/13 2218 06/29/13 0656  GLUCAP 219* 139* 166*    IV Fluid Intake:     MEDICATIONS  . atorvastatin  20 mg Oral q1800  . clopidogrel  75 mg Oral Q breakfast  . insulin aspart  0-9 Units Subcutaneous TID WC  . insulin glargine  8 Units Subcutaneous Daily   PRN:    Diet:  Carb Control  liquids Activity:   Up with assistance DVT Prophylaxis:  SCD  CLINICALLY SIGNIFICANT STUDIES Basic Metabolic Panel:   Recent Labs Lab 06/27/13 1742 06/27/13 2033  NA 141 137  K 4.0 4.3  CL 101 101  CO2  --  24  GLUCOSE 303* 219*  BUN 9 9  CREATININE 1.00 0.84  CALCIUM  --  8.8   Liver Function Tests: No results found for this basename: AST, ALT, ALKPHOS, BILITOT, PROT, ALBUMIN,  in the last 168 hours CBC:   Recent Labs Lab 06/27/13 1722 06/27/13 1742  WBC 9.4  --   NEUTROABS 5.5  --   HGB 14.4 16.3  HCT 43.2 48.0  MCV 82.3  --   PLT 241  --    Coagulation: No results found for this basename: LABPROT, INR,  in the last 168  hours Cardiac Enzymes: No results found for this basename: CKTOTAL, CKMB, CKMBINDEX, TROPONINI,  in the last 168 hours Urinalysis: No results found for this basename: COLORURINE, APPERANCEUR, LABSPEC, PHURINE, GLUCOSEU, HGBUR, BILIRUBINUR, KETONESUR, PROTEINUR, UROBILINOGEN, NITRITE, LEUKOCYTESUR,  in the last 168 hours Lipid Panel    Component Value Date/Time   CHOL 171 06/28/2013 0516   TRIG 164* 06/28/2013 0516   HDL 34* 06/28/2013 0516   CHOLHDL 5.0 06/28/2013 0516   VLDL 33 06/28/2013 0516   LDLCALC 104* 06/28/2013 0516   HgbA1C  Lab Results  Component Value Date   HGBA1C 10.0* 06/27/2013    Urine Drug Screen:     Component Value Date/Time   LABOPIA NONE DETECTED 06/28/2013 0521   COCAINSCRNUR NONE DETECTED 06/28/2013 0521   LABBENZ NONE DETECTED 06/28/2013 0521   AMPHETMU NONE DETECTED 06/28/2013 0521   THCU NONE DETECTED 06/28/2013 0521   LABBARB NONE DETECTED 06/28/2013 0521    Alcohol Level: No results found for this basename: ETH,  in the last 168 hours  Ct Head Wo Contrast  06/27/2013   CLINICAL DATA:  58 year old male with aphasia and ataxia.  EXAM: CT  HEAD WITHOUT CONTRAST  TECHNIQUE: Contiguous axial images were obtained from the base of the skull through the vertex without intravenous contrast.  COMPARISON:  None.  FINDINGS: A 1 cm focal hypodensity in the right frontal periventricular white matter is indeterminate but most likely represents chronic ischemic changes or possibly an age indeterminate white matter infarct.  No acute intracranial abnormalities are identified, including mass lesion or mass effect, hydrocephalus, extra-axial fluid collection, midline shift, hemorrhage, or acute infarction.  The visualized bony calvarium is unremarkable.  IMPRESSION: Right frontal periventricular white matter hypodensity - likely chronic ischemic change or age indeterminate infarct (probably subacute to remote). Correlate clinically.  No other acute intracranial abnormality identified.    Electronically Signed   By: Hassan Rowan M.D.   On: 06/27/2013 21:37   Mr Jodene Nam Head Wo Contrast  06/28/2013   CLINICAL DATA:  Slurred speech.  Gait imbalance.  Stroke.  EXAM: MRI HEAD WITHOUT CONTRAST  MRA HEAD WITHOUT CONTRAST  TECHNIQUE: Multiplanar, multiecho pulse sequences of the brain and surrounding structures were obtained without intravenous contrast. Angiographic images of the head were obtained using MRA technique without contrast.  COMPARISON:  CT head without contrast 06/27/2013.  FINDINGS: MRI HEAD FINDINGS  An acute 2 cm infarct is present within the anterior aspect of the left lentiform nucleus, internal capsule, and corona radiata. T1 shortening within the infarct suggests acute petechial hemorrhage.  The area adjacent to the right lateral ventricle represents a remote nonhemorrhagic infarct.  Extensive periventricular and subcortical white matter changes are present bilaterally. An additional remote lacunar infarct is present within the posterior aspect of the right lentiform nucleus. There are T2 changes associated with the acute infarct.  Flow is present in the major intracranial arteries. The globes and orbits are intact. Circumferential mucosal thickening is present in the maxillary sinuses bilaterally with debris posteriorly on the right. There is a fluid level in the posterior left ethmoid air cells. Mucosal thickening is asymmetric in the anterior right ethmoid and frontal sinuses. The mastoid air cells are clear.  MRA HEAD FINDINGS  The internal carotid arteries are within normal limits from the high cervical segments through the ICA termini bilaterally. The A1 and M1 segments are normal. A focal stenosis is present with and a proximal anterior right M3 branch. There severe stenoses within the proximal M2 branches on the left. This results in significant differential attenuation of MCA branch vessels. Distal small vessel disease is present bilaterally.  The left vertebral artery is slightly  dominant to the right. The PICA origins are visualized and normal bilaterally. The vertebrobasilar junction is normal. The basilar artery is normal. Both posterior cerebral arteries originate from the basilar tip. There is some segmental attenuation of PCA branch vessels bilaterally without a significant proximal stenosis.  IMPRESSION: 1. Acute 2 cm infarct within the anterior aspect of the left lentiform nucleus, internal capsule, and corona radiata with evidence of petechial hemorrhage. 2. The infarct adjacent to the right lateral ventricle represents a remote nonhemorrhagic infarct. 3. Extensive periventricular and subcortical white matter disease bilaterally likely reflects the sequelae of chronic microvascular ischemia. 4. Remote lacunar infarct in the posterior right lentiform nucleus. 5. High-grade stenoses within the proximal MCA branch vessels bilaterally. 6. Moderate distal small vessel disease. 7. Scattered bilateral sinus disease. Critical Value/emergent results were called by telephone at the time of interpretation on 06/28/2013 at 8:31 AM to Dr. Candiss Norse , who verbally acknowledged these results.   Electronically Signed   By: Bretta Bang.D.  On: 06/28/2013 08:32   Mr Brain Wo Contrast  06/28/2013   CLINICAL DATA:  Slurred speech.  Gait imbalance.  Stroke.  EXAM: MRI HEAD WITHOUT CONTRAST  MRA HEAD WITHOUT CONTRAST  TECHNIQUE: Multiplanar, multiecho pulse sequences of the brain and surrounding structures were obtained without intravenous contrast. Angiographic images of the head were obtained using MRA technique without contrast.  COMPARISON:  CT head without contrast 06/27/2013.  FINDINGS: MRI HEAD FINDINGS  An acute 2 cm infarct is present within the anterior aspect of the left lentiform nucleus, internal capsule, and corona radiata. T1 shortening within the infarct suggests acute petechial hemorrhage.  The area adjacent to the right lateral ventricle represents a remote nonhemorrhagic infarct.   Extensive periventricular and subcortical white matter changes are present bilaterally. An additional remote lacunar infarct is present within the posterior aspect of the right lentiform nucleus. There are T2 changes associated with the acute infarct.  Flow is present in the major intracranial arteries. The globes and orbits are intact. Circumferential mucosal thickening is present in the maxillary sinuses bilaterally with debris posteriorly on the right. There is a fluid level in the posterior left ethmoid air cells. Mucosal thickening is asymmetric in the anterior right ethmoid and frontal sinuses. The mastoid air cells are clear.  MRA HEAD FINDINGS  The internal carotid arteries are within normal limits from the high cervical segments through the ICA termini bilaterally. The A1 and M1 segments are normal. A focal stenosis is present with and a proximal anterior right M3 branch. There severe stenoses within the proximal M2 branches on the left. This results in significant differential attenuation of MCA branch vessels. Distal small vessel disease is present bilaterally.  The left vertebral artery is slightly dominant to the right. The PICA origins are visualized and normal bilaterally. The vertebrobasilar junction is normal. The basilar artery is normal. Both posterior cerebral arteries originate from the basilar tip. There is some segmental attenuation of PCA branch vessels bilaterally without a significant proximal stenosis.  IMPRESSION: 1. Acute 2 cm infarct within the anterior aspect of the left lentiform nucleus, internal capsule, and corona radiata with evidence of petechial hemorrhage. 2. The infarct adjacent to the right lateral ventricle represents a remote nonhemorrhagic infarct. 3. Extensive periventricular and subcortical white matter disease bilaterally likely reflects the sequelae of chronic microvascular ischemia. 4. Remote lacunar infarct in the posterior right lentiform nucleus. 5. High-grade  stenoses within the proximal MCA branch vessels bilaterally. 6. Moderate distal small vessel disease. 7. Scattered bilateral sinus disease. Critical Value/emergent results were called by telephone at the time of interpretation on 06/28/2013 at 8:31 AM to Dr. Candiss Norse , who verbally acknowledged these results.   Electronically Signed   By: Lawrence Santiago M.D.   On: 06/28/2013 08:32    CT of the brain   IMPRESSION:  Right frontal periventricular white matter hypodensity - likely  chronic ischemic change or age indeterminate infarct (probably  subacute to remote). Correlate clinically.  No other acute intracranial abnormality identified.  MRI of the brain, MRA head IMPRESSION:  1. Acute 2 cm infarct within the anterior aspect of the left  lentiform nucleus, internal capsule, and corona radiata with  evidence of petechial hemorrhage.  2. The infarct adjacent to the right lateral ventricle represents a  remote nonhemorrhagic infarct.  3. Extensive periventricular and subcortical white matter disease  bilaterally likely reflects the sequelae of chronic microvascular  ischemia.  4. Remote lacunar infarct in the posterior right lentiform nucleus.  5. High-grade  stenoses within the proximal MCA branch vessels  bilaterally.  6. Moderate distal small vessel disease.  7. Scattered bilateral sinus disease   2D Echocardiogram   Study Conclusions  - Left ventricle: The cavity size was normal. Wall thickness was normal. Systolic function was normal. The estimated ejection fraction was in the range of 60% to 65%.   Carotid Doppler   Vascular Ultrasound  Carotid Duplex (Doppler) has been completed.  Findings suggest 1-39% internal carotid artery stenosis bilaterally. Vertebral arteries are patent with antegrade flow.  CXR    EKG    Sinus rhythm with occasional Premature ventricular complexes Otherwise normal ECG  Therapy Recommendations no PT followup   Physical Exam  General: The patient  is alert and cooperative at the time of the examination.  Skin: No significant peripheral edema is noted.   Neurologic Exam  Mental status: The patient is oriented x 3.  Cranial nerves: Facial symmetry is present. Speech is normal, no aphasia or dysarthria is noted. Extraocular movements are full. Visual fields are full.  Motor: The patient has good strength in all 4 extremities.  Sensory examination: Soft touch sensation is symmetric on the face, arms, and legs  Coordination: The patient has good finger-nose-finger and heel-to-shin bilaterally.  Gait and station: The gait was not tested.  Reflexes: Deep tendon reflexes are symmetric.    ASSESSMENT Billy Torres is a 58 y.o. male presenting with slurred speech. MRI confirms a left internal capsule and centrum semiovale infarct. Intracranial atherosclerosis is seen. The patient indicates that he was not on any antiplatelet agents prior to admission. He has near full resolution of his deficits. He was not a TPA candidate secondary to delay in seeking medical attention. The patient was found to have high blood sugars on admission, had no primary care physician prior to admission.   Diabetes  Left brain stroke  Dyslipidemia  Hospital day # 2  The stroke event was likely secondary to intracranial atherosclerotic changes, not cardioembolic the patient had unrecognized diabetes and dyslipidemia prior to admission. He'll need a primary care physician following discharge, and actively managed his diabetes, dyslipidemia, blood pressure, and tobacco use.  TREATMENT/PLAN  Continue Plavix  Management of diabetes  Statin medication, LDL is 104  Possible discharge to home today, May followup with [facility] within 6-8 weeks following discharge.  Smoking cessation has been recommended   06/29/2013 8:07 AM  Marshell Garfinkel, Deidre B

## 2013-06-29 NOTE — Progress Notes (Signed)
Inpatient Diabetes Program Recommendations  AACE/ADA: New Consensus Statement on Inpatient Glycemic Control (2013)  Target Ranges:  Prepandial:   less than 140 mg/dL      Peak postprandial:   less than 180 mg/dL (1-2 hours)      Critically ill patients:  140 - 180 mg/dL   Follow-up regarding consult.  New onset diabetes being discharged on insulin  Note:  Patient gave insulin injection this morning.  To draw up insulin and give at lunch.  Patient works as a Development worker, community.  Since patient is taking meal-time insulin, will need to take meter and insulin to job site.  Cautioned him about keeping it in a cooler in a zip-lock bag, but not up against anything frozen.    Discussed s/s and treatment of hypoglycemia.  Showed him the glucose tabs he has in starter kit.  Advised him to prepare treatments in advance and put in his pockets-- measuring out 15 GM CHO of something with simple sugar such as jelly beans, sweet tarts, etc.  Discussed other treatment options as well such as 1/2 cup juice or regular soft drink.  Patient verbalized understanding.  Discussed his need for a glucose meter.  Reminded him of meter available at [facility] if unable to get at [facility].  Discussed appropriate site for sticking finger.  Suggested he ask pharmacist to show him basically how to use meter and if he has questions call the 800#.  Advised him to record CBG readings so MD can make adjustments to medications.  Reinforced plan to go to [facility] as a "walk in" to establish care.  Encouraged follow-up with [facility].  Patient receptive to my visit.  Engaged in our discussion.  Thank you.  Patti S. Marcelline Mates, RN, CNS, CDE Inpatient Diabetes Program, team pager [phone]

## 2013-06-29 NOTE — Evaluation (Signed)
Occupational Therapy Evaluation Patient Details Name: Billy Torres MRN: [id number] DOB: 06/16/1955 Today's Date: 06/29/2013    History of Present Illness 58 y.o. male with no know PMH, does not have PCP who presents to ED complaining of generalized weakness for last week. His family also notice his speech was slurred this morning and the night prior to admission. Patient also was notice to have gait problems. MRI revealed an acute 2 cm infarct is present within the anterior aspect of the left lentiform nucleus, internal capsule, and corona radiata. T1 shortening within the infarct suggests acute petechial hemorrhage. The area adjacent to the right lateral ventricle represents a remote nonhemorrhagic infarct. An additional remote lacunar infarct is present  within the posterior aspect of the right lentiform nucleus.   Clinical Impression   Pt with apparent vision deficits. Recommending outpatient OT for followup as well as for to to get a full visual field assessment at opthamologist. 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    Follow Up Recommendations  Outpatient OT; full visual field assessment at opthamologist   Equipment Recommendations  Other (comment) (pt did not want shower chair)    Recommendations for Other Services       Precautions / Restrictions Precautions Precautions: None Restrictions Weight Bearing Restrictions: No      Mobility Bed Mobility                  Transfers Overall transfer level: Modified independent                    Balance                                            ADL Overall ADL's : Needs assistance/impaired             Lower Body Bathing: Supervison/ safety (standing)           Toilet Transfer: Modified Independent;Ambulation       Tub/ Shower Transfer: Modified independent;Ambulation (practiced stepping over)   Functional mobility during ADLs: Modified  independent General ADL Comments: Pt with slight LOB when simulating LB bathing-but self corrected. Educated on signs/symptoms of stroke and importance of getting help right away. Recommeded avoiding canned foods due to increased sodium. Wrote down BE FAST education on handout for pt. Spoke about using chair for LB bathing/being very careful if standing for LB bathing. Recommended pt see an opthamologist and get a full visual field assessement and NO driving.  Pt read part of newspaper-he said it seems a little slower than normal. Educated on safety during session.     Vision       Tracking/Visual Pursuits:  (difficult in inferior quadrants)   Convergence: Within functional limits  Visual fields: apparent deficit in left superior quadrant  Pt has glasses but states he does not wear them         Perception     Praxis      Pertinent Vitals/Pain No apparent distress.     Hand Dominance Right   Extremity/Trunk Assessment Upper Extremity Assessment Upper Extremity Assessment: Overall WFL for tasks assessed (cues to slow down with coordination testing)   Lower Extremity Assessment Lower Extremity Assessment: Overall WFL for tasks assessed       Communication Communication Communication: No difficulties   Cognition Arousal/Alertness: Awake/alert Behavior During Therapy: Flat affect Overall Cognitive Status:  Within Functional Limits for tasks assessed                     General Comments       Exercises       Shoulder Instructions      Home Living Family/patient expects to be discharged to:: Private residence Living Arrangements: Alone (son and son's gf lives with him) Available Help at Discharge: Family;Available 24 hours/day Type of Home: House Home Access: Level entry     Home Layout: One level     Bathroom Shower/Tub: Teacher, early years/pre: Standard     Home Equipment: None          Prior Functioning/Environment Level of  Independence: Independent             OT Diagnosis: Disturbance of vision   OT Problem List: Impaired vision/perception   OT Treatment/Interventions: Therapeutic exercise;Therapeutic activities    OT Goals(Current goals can be found in the care plan section) ADL Goals Additional ADL Goal #1: Pt will be independent with HEP for vision deficits and be agreeable to NO driving.  OT Frequency: Min 2X/week   Barriers to D/C:            Co-evaluation              End of Session    Activity Tolerance: Patient tolerated treatment well Patient left: in chair;with chair alarm set   Time: 619 020 [id number] [phone] (stepped out to call another OT)  OT Time Calculation (min): 34 min Charges:  OT General Charges $OT Visit: 1 Procedure OT Treatments $Therapeutic Activity: 8-22 mins G-Codes: OT G-codes **NOT FOR INPATIENT CLASS** Functional Assessment Tool Used: clinical judgment Functional Limitation: Self care Self Care Current Status (Q2595): At least 1 percent but less than 20 percent impaired, limited or restricted Self Care Goal Status (G3875): 0 percent impaired, limited or restricted  Benito Mccreedy OTR/L [phone] 06/29/2013, 10:18 AM

## 2013-06-30 LAB — GLUCOSE, CAPILLARY: Glucose-Capillary: 245 mg/dL — ABNORMAL HIGH (ref 70–99)

## 2013-07-02 NOTE — ED Provider Notes (Signed)
History/physical exam/procedure(s) were performed by non-physician practitioner and as supervising physician I was immediately available for consultation/collaboration. I have reviewed all notes and am in agreement with care and plan.   Shaune Pollack, MD 07/02/13 [phone]

## 2013-07-06 ENCOUNTER — Telehealth: Payer: Self-pay | Admitting: General Practice

## 2013-07-06 NOTE — Telephone Encounter (Signed)
Left voicemail for patient to call to establish care.

## 2013-07-22 ENCOUNTER — Encounter: Payer: BC Managed Care – PPO | Attending: Internal Medicine

## 2013-07-22 DIAGNOSIS — E119 Type 2 diabetes mellitus without complications: Secondary | ICD-10-CM

## 2013-07-22 DIAGNOSIS — Z713 Dietary counseling and surveillance: Secondary | ICD-10-CM | POA: Insufficient documentation

## 2013-07-22 DIAGNOSIS — Z794 Long term (current) use of insulin: Secondary | ICD-10-CM | POA: Insufficient documentation

## 2013-07-22 NOTE — Progress Notes (Signed)
Patient was seen on 07/22/2013 for the first of a series of three diabetes self-management courses at [facility].  Current HbA1c: 10%  The following learning objectives were met by the patient during this class:  Describe diabetes  State some common risk factors for diabetes  Defines the role of glucose and insulin  Identifies type of diabetes and pathophysiology  Describe the relationship between diabetes and cardiovascular risk  State the members of the Healthcare Team  States the rationale for glucose monitoring  State when to test glucose  State their individual Target Range  State the importance of logging glucose readings  Describe how to interpret glucose readings  Identifies A1C target  Explain the correlation between A1c and eAG values  State symptoms and treatment of high blood glucose  State symptoms and treatment of low blood glucose  Explain proper technique for glucose testing  Identifies proper sharps disposal  Handouts given during class include:  Living Well with Diabetes book  Carb Counting and Meal Planning book  Meal Plan Card  Carbohydrate guide  Meal planning worksheet  Low Sodium Flavoring Tips  The diabetes portion plate  A1c to eAG Conversion Chart  Diabetes Medications  Diabetes Recommended Care Schedule  Support Group  Diabetes Success Plan  Core Class Satisfaction Survey  Follow-Up Plan:  Attend core 2    

## 2013-07-29 ENCOUNTER — Ambulatory Visit: Payer: Self-pay

## 2013-08-05 ENCOUNTER — Ambulatory Visit: Payer: Self-pay

## 2013-08-31 ENCOUNTER — Ambulatory Visit: Payer: Self-pay | Admitting: Neurology

## 2013-11-24 ENCOUNTER — Ambulatory Visit: Payer: BC Managed Care – PPO | Admitting: Family Medicine

## 2014-01-13 ENCOUNTER — Ambulatory Visit: Payer: Self-pay | Admitting: Family Medicine

## 2014-01-27 ENCOUNTER — Telehealth: Payer: Self-pay | Admitting: General Practice

## 2014-01-27 NOTE — Telephone Encounter (Signed)
Attempted to call patient to reschedule appointment for 01/28/14. We will be closed due to inclement weather. Disconnected number.

## 2014-01-28 ENCOUNTER — Ambulatory Visit: Payer: Self-pay | Admitting: Family Medicine

## 2014-02-28 ENCOUNTER — Ambulatory Visit (INDEPENDENT_AMBULATORY_CARE_PROVIDER_SITE_OTHER): Payer: Self-pay | Admitting: Family Medicine

## 2014-02-28 ENCOUNTER — Encounter: Payer: Self-pay | Admitting: Family Medicine

## 2014-02-28 VITALS — BP 130/70 | HR 86 | Temp 97.8°F | Resp 16 | Ht 67.0 in | Wt 152.0 lb

## 2014-02-28 DIAGNOSIS — E785 Hyperlipidemia, unspecified: Secondary | ICD-10-CM

## 2014-02-28 DIAGNOSIS — F17200 Nicotine dependence, unspecified, uncomplicated: Secondary | ICD-10-CM

## 2014-02-28 DIAGNOSIS — E088 Diabetes mellitus due to underlying condition with unspecified complications: Secondary | ICD-10-CM

## 2014-02-28 DIAGNOSIS — M25511 Pain in right shoulder: Secondary | ICD-10-CM

## 2014-02-28 DIAGNOSIS — Z8673 Personal history of transient ischemic attack (TIA), and cerebral infarction without residual deficits: Secondary | ICD-10-CM

## 2014-02-28 DIAGNOSIS — Z23 Encounter for immunization: Secondary | ICD-10-CM

## 2014-02-28 LAB — COMPLETE METABOLIC PANEL WITH GFR
ALT: 22 U/L (ref 0–53)
AST: 14 U/L (ref 0–37)
Albumin: 4.3 g/dL (ref 3.5–5.2)
Alkaline Phosphatase: 77 U/L (ref 39–117)
BUN: 13 mg/dL (ref 6–23)
CALCIUM: 9.6 mg/dL (ref 8.4–10.5)
CHLORIDE: 100 meq/L (ref 96–112)
CO2: 25 mEq/L (ref 19–32)
Creat: 1.04 mg/dL (ref 0.50–1.35)
GFR, EST NON AFRICAN AMERICAN: 78 mL/min
GFR, Est African American: >89 mL/min
Glucose, Bld: 238 mg/dL — ABNORMAL HIGH (ref 70–99)
Potassium: 4.3 mEq/L (ref 3.5–5.3)
Sodium: 138 mEq/L (ref 135–145)
Total Bilirubin: 0.4 mg/dL (ref 0.2–1.2)
Total Protein: 7.7 g/dL (ref 6.0–8.3)

## 2014-02-28 LAB — GLUCOSE, CAPILLARY: GLUCOSE-CAPILLARY: 244 mg/dL — AB (ref 70–99)

## 2014-02-28 LAB — LIPID PANEL
CHOLESTEROL: 206 mg/dL — AB (ref 0–200)
HDL: 38 mg/dL — ABNORMAL LOW (ref >=40)
LDL CALC: 123 mg/dL — AB (ref 0–99)
Total CHOL/HDL Ratio: 5.4 Ratio
Triglycerides: 223 mg/dL — ABNORMAL HIGH (ref <150)
VLDL: 45 mg/dL — ABNORMAL HIGH (ref 0–40)

## 2014-02-28 LAB — HEMOGLOBIN A1C
Hgb A1c MFr Bld: 10.2 % — ABNORMAL HIGH (ref <5.7)
Mean Plasma Glucose: 246 mg/dL — ABNORMAL HIGH (ref <117)

## 2014-02-28 MED ORDER — ASPIRIN EC 81 MG PO TBEC: 81.0000 mg | DELAYED_RELEASE_TABLET | Freq: Every day | ORAL | Status: DC

## 2014-02-28 MED ORDER — METFORMIN HCL 500 MG PO TABS: 500.0000 mg | ORAL_TABLET | Freq: Two times a day (BID) | ORAL | Status: DC

## 2014-02-28 MED ORDER — GLUCOSE BLOOD VI STRP: ORAL_STRIP | Status: DC

## 2014-02-28 NOTE — Progress Notes (Signed)
Subjective:    Patient ID: Billy Torres, male    DOB: 06-14-1955, 59 y.o.   MRN: [id number]  HPI Mr. Billy Torres is a 59 year old male that presents to establish care. He states that he was previously a patient of Dr. Marton Redwood at [facility]. He states that he was discharged from the practice. He states that he has not been to a primary doctor in quite some time. He maintains that the last time he saw a physician was when he was hosptalized for a TIA in June 2015.   He reports a history of type 2 diabetes mellitus. He states that he has been checking blood sugars daily. He was started on Lantus and Novalog per sliding scale upon hospital discharge. He states that he is out of Lantus so he is systematically giving himself 10 units of Novalog per day and checking blood sugars with home glucometer.  Patient denies foot ulcerations, increase appetite, paresthesia of the feet, polydipsia, polyuria, visual disturbances, vomitting and weight loss.   Home sugars: BGs have been labile ranging between 200 and 220.    Patient complaints of right shoulder pain.  The pain is described as aching.  The onset of the pain was gradual, starting about 3 months ago.  The pain occurs when active.  Location is anterior without signs of dislocation. Symptoms are aggravated by strenuous activities. Symptoms are diminished by  Rest and Ibuprofen.  Patient reports occasional shoulder stiffness. He denies edema, numbness, tingling, or crepitus. He states that the last had Ibuprofen 1 day ago with moderate relief.      Past Medical History  Diagnosis Date  . Diabetes mellitus without complication   . Hypertension   . Hyperlipidemia    History   Social History  . Marital Status: Divorced    Spouse Name: N/A  . Number of Children: N/A  . Years of Education: N/A   Occupational History  . Not on file.   Social History Main Topics  . Smoking status: Current Every Day Smoker -- 1.00 packs/day     Types: Cigarettes  . Smokeless tobacco: Not on file  . Alcohol Use: No  . Drug Use: No  . Sexual Activity: Not on file   Other Topics Concern  . Not on file   Social History Narrative  . No narrative on file   Review of Systems  Constitutional: Negative.   HENT: Negative.   Eyes: Negative.   Respiratory: Negative.   Cardiovascular: Negative.   Gastrointestinal: Negative.   Endocrine: Negative.  Negative for polydipsia, polyphagia and polyuria.  Genitourinary: Negative.   Musculoskeletal: Positive for myalgias (right shoulder pain).  Skin: Negative.   Allergic/Immunologic: Negative.   Neurological: Negative.  Negative for dizziness, facial asymmetry, light-headedness, numbness and headaches.  Hematological: Negative.   Psychiatric/Behavioral: Negative.        Objective:   Physical Exam  Constitutional: He is oriented to person, place, and time. He appears well-developed and well-nourished.  HENT:  Head: Normocephalic and atraumatic.  Right Ear: External ear normal.  Left Ear: External ear normal.  Mouth/Throat: Oropharynx is clear and moist.  Eyes: Conjunctivae and EOM are normal. Pupils are equal, round, and reactive to light.  Neck: Normal range of motion. Neck supple.  Cardiovascular: Normal rate, regular rhythm, normal heart sounds and intact distal pulses.   Pulmonary/Chest: Effort normal and breath sounds normal.  Abdominal: Soft. Bowel sounds are normal.  Musculoskeletal:  Right shoulder: He exhibits decreased range of motion, tenderness, pain and decreased strength. He exhibits no swelling, no effusion and normal pulse.  Neurological: He is alert and oriented to person, place, and time. He has normal reflexes. He displays tremor. No cranial nerve deficit or sensory deficit. Gait abnormal.  Skin: Skin is warm and dry.  Psychiatric: He has a normal mood and affect. His behavior is normal. Judgment and thought content normal.         BP 147/80 mmHg   Pulse 86  Temp(Src) 97.8 F (36.6 C) (Oral)  Resp 16  Ht 5\' 7"  (1.702 m)  Wt 152 lb (68.947 kg)  BMI 23.80 kg/m2 Assessment & Plan:  1. Diabetes mellitus due to underlying condition with complications Patient states that he has been taking Novalog 10 units daily and checking blood sugar daily, his blood sugars have been averaging between 200 and 220. He states that he was prescribed Novalog during a hospital visit in June 2015. He has been adjusting medications himself so that they would last longer due to financial constraints. He is taking medication inappropriately and  I advised Billy Torres to discontinue Novalog. He maintains that he has never been on oral medications for diabetes.  I reviewed previous renal studies, which are normal. I will obtain current labs today and add Metformin 500 mg BID to medication regimen. Billy Torres expressed understanding.   Metformin 500 mg BID - Hemoglobin A1c - Glucose (CBG) - glucose blood test strip; Use as instructed  Dispense: 100 each; Refill: 12 - Urinalysis, Complete - COMPLETE METABOLIC PANEL WITH GFR  2. Dyslipidemia Patient has a history of a TIA, he was started on Atorvastatin as an outpatient. Reviewed previous cholesterol results ASCVD score is 32%. I will restart Atorvastatin today.  - Lipid Panel  3. History of CVA (cerebrovascular accident) Mr. Billy Torres lacks insight regarding the severity of chronic diseases. He did not follow up with neurology as scheduled following TIA in June 2015. Patient was hospitalized for a CVA in June 2015. Brain MRI revealed several brain infarcts. Patient was prescribed Plavix 75 mg, Aspirin 81 mg daily and placed on statin therapy. He states that he has not taken Plavix or aspirin in 8 months. I will send patient to neurology for evaluation. Today patient will start Aspirin 81 mg daily and restart statin therapy.  I will also check cholesterol panel.   4. Right shoulder pain Patient will need an x-ray  to assess for degenerative changes. He will not be able to get xray today due to transportation constraints. Patient's passive range of motion is limited with 4/5 strength to right arm. I recommend that he apply heating pad to loosen shoulder and reviewed shoulder stretches.  - DG Shoulder Right; Future  5. Tobacco dependence Patient states that he has cut down to 5 cigarettes per week. Patient is not ready to quit, he is in pre-contemplative state. Smoking cessation instruction/counseling given:  counseled patient on the dangers of tobacco use, advised patient to stop smoking, and reviewed strategies to maximize success   6. Immunization due - Pneumococcal polysaccharide vaccine 23-valent greater than or equal to 2yo subcutaneous/IM    RTC: 1 month with Dr. Lawson Fiscal, FNP

## 2014-03-01 ENCOUNTER — Other Ambulatory Visit: Payer: Self-pay

## 2014-03-01 DIAGNOSIS — E088 Diabetes mellitus due to underlying condition with unspecified complications: Secondary | ICD-10-CM

## 2014-03-01 LAB — URINALYSIS, COMPLETE
Bacteria, UA: NONE SEEN
CRYSTALS: NONE SEEN
Casts: NONE SEEN
Hgb urine dipstick: NEGATIVE
Ketones, ur: NEGATIVE mg/dL
LEUKOCYTES UA: NEGATIVE
Nitrite: NEGATIVE
Protein, ur: NEGATIVE mg/dL
SPECIFIC GRAVITY, URINE: 1.03 (ref 1.005–1.030)
SQUAMOUS EPITHELIAL / LPF: NONE SEEN
Urobilinogen, UA: 1 mg/dL (ref 0.0–1.0)
pH: 5 (ref 5.0–8.0)

## 2014-03-01 MED ORDER — GLUCOSE BLOOD VI STRP: ORAL_STRIP | Status: DC

## 2014-03-01 NOTE — Telephone Encounter (Signed)
Resent in test strips with correct name and times patient needed to test.

## 2014-03-02 ENCOUNTER — Telehealth: Payer: Self-pay | Admitting: Family Medicine

## 2014-03-02 MED ORDER — ATORVASTATIN CALCIUM 40 MG PO TABS: 40.0000 mg | ORAL_TABLET | Freq: Every day | ORAL | Status: DC

## 2014-03-02 MED ORDER — LISINOPRIL 10 MG PO TABS: 10.0000 mg | ORAL_TABLET | Freq: Every day | ORAL | Status: DC

## 2014-03-02 NOTE — Telephone Encounter (Signed)
Attempted to notify patient of laboratory results and discuss medication regimen further. I have not been able to reach patient on either of the numbers that he has listed. We will attempt to reach patient by other means.    Dorena Dew, FNP

## 2014-03-03 ENCOUNTER — Other Ambulatory Visit: Payer: Self-pay

## 2014-03-03 DIAGNOSIS — M25511 Pain in right shoulder: Secondary | ICD-10-CM | POA: Insufficient documentation

## 2014-03-03 DIAGNOSIS — E785 Hyperlipidemia, unspecified: Secondary | ICD-10-CM

## 2014-03-03 MED ORDER — ATORVASTATIN CALCIUM 40 MG PO TABS: 40.0000 mg | ORAL_TABLET | Freq: Every day | ORAL | Status: DC

## 2014-03-03 NOTE — Patient Instructions (Addendum)
Type 2 Diabetes Mellitus Type 2 diabetes mellitus is a long-term (chronic) disease. In type 2 diabetes:  The pancreas does not make enough of a hormone called insulin.  The cells in the body do not respond as well to the insulin that is made.  Both of the above can happen. Normally, insulin moves sugars from food into tissue cells. This gives you energy. If you have type 2 diabetes, sugars cannot be moved into tissue cells. This causes high blood sugar (hyperglycemia).  HOME CARE  Have your hemoglobin A1c level checked twice a year. The level shows if your diabetes is under control or out of control.  Test your blood sugar level every day as told by your doctor.  Check your ketone levels by testing your pee (urine) when you are sick and as told.  Take your diabetes or insulin medicine as told by your doctor.  Never run out of insulin.  Adjust how much insulin you give yourself based on how many carbs (carbohydrates) you eat. Carbs are in many foods, such as fruits, vegetables, whole grains, and dairy products.  Have a healthy snack between every healthy meal. Have 3 meals and 3 snacks a day.  Lose weight if you are overweight.  Carry a medical alert card or wear your medical alert jewelry.  Carry a 15-gram carb snack with you at all times. Examples include:  Glucose pills, 3 or 4.  Glucose gel, 15-gram tube.  Raisins, 2 tablespoons (24 grams).  Jelly beans, 6.  Animal crackers, 8.  Regular (not diet) pop, 4 ounces (120 milliliters).  Gummy treats, 9.  Notice low blood sugar (hypoglycemia) symptoms, such as:  Shaking (tremors).  Trouble thinking clearly.  Sweating.  Faster heart rate.  Headache.  Dry mouth.  Hunger.  Crabbiness (irritability).  Being worried or tense (anxious).  Restless sleep.  A change in speech or coordination.  Confusion.  Treat low blood sugar right away. If you are alert and can swallow, follow the 15:15 rule:  Take 15-20  grams of a rapid-acting glucose or carb. This includes glucose gel, glucose pills, or 4 ounces (120 milliliters) of fruit juice, regular pop, or low-fat milk.  Check your blood sugar level 15 minutes after taking the glucose.  Take 15-20 grams more of glucose if the repeat blood sugar level is still 70 mg/dL (milligrams/deciliter) or below.  Eat a meal or snack within 1 hour of the blood sugar levels going back to normal.  Notice early symptoms of high blood sugar, such as:  Being really thirsty or drinking a lot (polydipsia).  Peeing a lot (polyuria).  Do at least 150 minutes of physical activity a week or as told.  Split the 150 minutes of activity up during the week. Do not do 150 minutes of activity in one day.  Perform exercises, such as weight lifting, at least 2 times a week or as told.  Spend no more than 90 minutes at one time inactive.  Adjust your insulin or food intake as needed if you start a new exercise or sport.  Follow your sick-day plan when you are not able to eat or drink as usual.  Do not smoke, chew tobacco, or use electronic cigarettes.  Women who are not pregnant should drink no more than 1 drink a day. Men should drink no more than 2 drinks a day.  Only drink alcohol with food.  Ask your doctor if alcohol is safe for you.  Tell your doctor if you  drink alcohol several times during the week.  See your doctor regularly.  Schedule an eye exam soon after you are told you have diabetes. Schedule exams once every year.  Check your skin and feet every day. Check for cuts, bruises, redness, nail problems, bleeding, blisters, or sores. A doctor should do a foot exam once a year.  Brush your teeth and gums twice a day. Floss once a day. Visit your dentist regularly.  Share your diabetes plan with your workplace or school.  Stay up-to-date with shots that fight against diseases (immunizations).  Learn how to deal with stress.  Get diabetes education and  support as needed.  Ask your doctor for special help if:  You need help to maintain or improve how you do things on your own.  You need help to maintain or improve the quality of your life.  You have foot or hand problems.  You have trouble cleaning yourself, dressing, eating, or doing physical activity. GET HELP IF:  You are unable to eat or drink for more than 6 hours.  You feel sick to your stomach (nauseous) or throw up (vomit) for more than 6 hours.  Your blood sugar level is over 240 mg/dL.  There is a change in mental status.  You get another serious illness.  You have watery poop (diarrhea) for more than 6 hours.  You have been sick or have had a fever for 2 or more days and are not getting better.  You have pain when you are active. GET HELP RIGHT AWAY IF:  You have trouble breathing.  Your ketone levels are higher than your doctor says they should be. MAKE SURE YOU:  Understand these instructions.  Will watch your condition.  Will get help right away if you are not doing well or get worse. Document Released: 10/03/2007 Document Revised: 05/10/2013 Document Reviewed: 07/26/2011 [facility] Patient Information 2015 [place], [place]. This information is not intended to replace advice given to you by your health care provider. Make sure you discuss any questions you have with your health care provider. Type 2 Diabetes Mellitus Type 2 diabetes mellitus is a long-term (chronic) disease. In type 2 diabetes:  The pancreas does not make enough of a hormone called insulin.  The cells in the body do not respond as well to the insulin that is made.  Both of the above can happen. Normally, insulin moves sugars from food into tissue cells. This gives you energy. If you have type 2 diabetes, sugars cannot be moved into tissue cells. This causes high blood sugar (hyperglycemia).  HOME CARE  Have your hemoglobin A1c level checked twice a year. The level shows if your  diabetes is under control or out of control.  Test your blood sugar level every day as told by your doctor.  Check your ketone levels by testing your pee (urine) when you are sick and as told.  Take your diabetes or insulin medicine as told by your doctor.  Never run out of insulin.  Adjust how much insulin you give yourself based on how many carbs (carbohydrates) you eat. Carbs are in many foods, such as fruits, vegetables, whole grains, and dairy products.  Have a healthy snack between every healthy meal. Have 3 meals and 3 snacks a day.  Lose weight if you are overweight.  Carry a medical alert card or wear your medical alert jewelry.  Carry a 15-gram carb snack with you at all times. Examples include:  Glucose pills, 3 or  4.  Glucose gel, 15-gram tube.  Raisins, 2 tablespoons (24 grams).  Jelly beans, 6.  Animal crackers, 8.  Regular (not diet) pop, 4 ounces (120 milliliters).  Gummy treats, 9.  Notice low blood sugar (hypoglycemia) symptoms, such as:  Shaking (tremors).  Trouble thinking clearly.  Sweating.  Faster heart rate.  Headache.  Dry mouth.  Hunger.  Crabbiness (irritability).  Being worried or tense (anxious).  Restless sleep.  A change in speech or coordination.  Confusion.  Treat low blood sugar right away. If you are alert and can swallow, follow the 15:15 rule:  Take 15-20 grams of a rapid-acting glucose or carb. This includes glucose gel, glucose pills, or 4 ounces (120 milliliters) of fruit juice, regular pop, or low-fat milk.  Check your blood sugar level 15 minutes after taking the glucose.  Take 15-20 grams more of glucose if the repeat blood sugar level is still 70 mg/dL (milligrams/deciliter) or below.  Eat a meal or snack within 1 hour of the blood sugar levels going back to normal.  Notice early symptoms of high blood sugar, such as:  Being really thirsty or drinking a lot (polydipsia).  Peeing a lot  (polyuria).  Do at least 150 minutes of physical activity a week or as told.  Split the 150 minutes of activity up during the week. Do not do 150 minutes of activity in one day.  Perform exercises, such as weight lifting, at least 2 times a week or as told.  Spend no more than 90 minutes at one time inactive.  Adjust your insulin or food intake as needed if you start a new exercise or sport.  Follow your sick-day plan when you are not able to eat or drink as usual.  Do not smoke, chew tobacco, or use electronic cigarettes.  Women who are not pregnant should drink no more than 1 drink a day. Men should drink no more than 2 drinks a day.  Only drink alcohol with food.  Ask your doctor if alcohol is safe for you.  Tell your doctor if you drink alcohol several times during the week.  See your doctor regularly.  Schedule an eye exam soon after you are told you have diabetes. Schedule exams once every year.  Check your skin and feet every day. Check for cuts, bruises, redness, nail problems, bleeding, blisters, or sores. A doctor should do a foot exam once a year.  Brush your teeth and gums twice a day. Floss once a day. Visit your dentist regularly.  Share your diabetes plan with your workplace or school.  Stay up-to-date with shots that fight against diseases (immunizations).  Learn how to deal with stress.  Get diabetes education and support as needed.  Ask your doctor for special help if:  You need help to maintain or improve how you do things on your own.  You need help to maintain or improve the quality of your life.  You have foot or hand problems.  You have trouble cleaning yourself, dressing, eating, or doing physical activity. GET HELP IF:  You are unable to eat or drink for more than 6 hours.  You feel sick to your stomach (nauseous) or throw up (vomit) for more than 6 hours.  Your blood sugar level is over 240 mg/dL.  There is a change in mental  status.  You get another serious illness.  You have watery poop (diarrhea) for more than 6 hours.  You have been sick or have had a  fever for 2 or more days and are not getting better.  You have pain when you are active. GET HELP RIGHT AWAY IF:  You have trouble breathing.  Your ketone levels are higher than your doctor says they should be. MAKE SURE YOU:  Understand these instructions.  Will watch your condition.  Will get help right away if you are not doing well or get worse. Document Released: 10/03/2007 Document Revised: 05/10/2013 Document Reviewed: 07/26/2011 [facility] Patient Information 2015 [place], [place]. This information is not intended to replace advice given to you by your health care provider. Make sure you discuss any questions you have with your health care provider. Type 2 Diabetes Mellitus Type 2 diabetes mellitus is a long-term (chronic) disease. In type 2 diabetes:  The pancreas does not make enough of a hormone called insulin.  The cells in the body do not respond as well to the insulin that is made.  Both of the above can happen. Normally, insulin moves sugars from food into tissue cells. This gives you energy. If you have type 2 diabetes, sugars cannot be moved into tissue cells. This causes high blood sugar (hyperglycemia).  HOME CARE  Have your hemoglobin A1c level checked twice a year. The level shows if your diabetes is under control or out of control.  Test your blood sugar level every day as told by your doctor.  Check your ketone levels by testing your pee (urine) when you are sick and as told.  Take your diabetes or insulin medicine as told by your doctor.  Never run out of insulin.  Adjust how much insulin you give yourself based on how many carbs (carbohydrates) you eat. Carbs are in many foods, such as fruits, vegetables, whole grains, and dairy products.  Have a healthy snack between every healthy meal. Have 3 meals and 3 snacks a  day.  Lose weight if you are overweight.  Carry a medical alert card or wear your medical alert jewelry.  Carry a 15-gram carb snack with you at all times. Examples include:  Glucose pills, 3 or 4.  Glucose gel, 15-gram tube.  Raisins, 2 tablespoons (24 grams).  Jelly beans, 6.  Animal crackers, 8.  Regular (not diet) pop, 4 ounces (120 milliliters).  Gummy treats, 9.  Notice low blood sugar (hypoglycemia) symptoms, such as:  Shaking (tremors).  Trouble thinking clearly.  Sweating.  Faster heart rate.  Headache.  Dry mouth.  Hunger.  Crabbiness (irritability).  Being worried or tense (anxious).  Restless sleep.  A change in speech or coordination.  Confusion.  Treat low blood sugar right away. If you are alert and can swallow, follow the 15:15 rule:  Take 15-20 grams of a rapid-acting glucose or carb. This includes glucose gel, glucose pills, or 4 ounces (120 milliliters) of fruit juice, regular pop, or low-fat milk.  Check your blood sugar level 15 minutes after taking the glucose.  Take 15-20 grams more of glucose if the repeat blood sugar level is still 70 mg/dL (milligrams/deciliter) or below.  Eat a meal or snack within 1 hour of the blood sugar levels going back to normal.  Notice early symptoms of high blood sugar, such as:  Being really thirsty or drinking a lot (polydipsia).  Peeing a lot (polyuria).  Do at least 150 minutes of physical activity a week or as told.  Split the 150 minutes of activity up during the week. Do not do 150 minutes of activity in one day.  Perform exercises, such  as weight lifting, at least 2 times a week or as told.  Spend no more than 90 minutes at one time inactive.  Adjust your insulin or food intake as needed if you start a new exercise or sport.  Follow your sick-day plan when you are not able to eat or drink as usual.  Do not smoke, chew tobacco, or use electronic cigarettes.  Women who are not  pregnant should drink no more than 1 drink a day. Men should drink no more than 2 drinks a day.  Only drink alcohol with food.  Ask your doctor if alcohol is safe for you.  Tell your doctor if you drink alcohol several times during the week.  See your doctor regularly.  Schedule an eye exam soon after you are told you have diabetes. Schedule exams once every year.  Check your skin and feet every day. Check for cuts, bruises, redness, nail problems, bleeding, blisters, or sores. A doctor should do a foot exam once a year.  Brush your teeth and gums twice a day. Floss once a day. Visit your dentist regularly.  Share your diabetes plan with your workplace or school.  Stay up-to-date with shots that fight against diseases (immunizations).  Learn how to deal with stress.  Get diabetes education and support as needed.  Ask your doctor for special help if:  You need help to maintain or improve how you do things on your own.  You need help to maintain or improve the quality of your life.  You have foot or hand problems.  You have trouble cleaning yourself, dressing, eating, or doing physical activity. GET HELP IF:  You are unable to eat or drink for more than 6 hours.  You feel sick to your stomach (nauseous) or throw up (vomit) for more than 6 hours.  Your blood sugar level is over 240 mg/dL.  There is a change in mental status.  You get another serious illness.  You have watery poop (diarrhea) for more than 6 hours.  You have been sick or have had a fever for 2 or more days and are not getting better.  You have pain when you are active. GET HELP RIGHT AWAY IF:  You have trouble breathing.  Your ketone levels are higher than your doctor says they should be. MAKE SURE YOU:  Understand these instructions.  Will watch your condition.  Will get help right away if you are not doing well or get worse. Document Released: 10/03/2007 Document Revised: 05/10/2013 Document  Reviewed: 07/26/2011 [facility] Patient Information 2015 [place], [place]. This information is not intended to replace advice given to you by your health care provider. Make sure you discuss any questions you have with your health care provider.

## 2014-03-03 NOTE — Telephone Encounter (Signed)
Sent rx for lipitor to pharmacy via e-script. Thanks!

## 2014-03-30 ENCOUNTER — Ambulatory Visit: Payer: Self-pay | Admitting: Family Medicine

## 2015-04-07 ENCOUNTER — Ambulatory Visit: Payer: Self-pay | Admitting: Family Medicine

## 2015-05-08 ENCOUNTER — Ambulatory Visit: Payer: Self-pay | Admitting: Family Medicine

## 2015-05-12 ENCOUNTER — Encounter: Payer: Self-pay | Admitting: Family Medicine

## 2015-05-12 ENCOUNTER — Other Ambulatory Visit: Payer: Self-pay | Admitting: Family Medicine

## 2015-05-12 ENCOUNTER — Ambulatory Visit (INDEPENDENT_AMBULATORY_CARE_PROVIDER_SITE_OTHER): Payer: Self-pay | Admitting: Family Medicine

## 2015-05-12 VITALS — BP 137/74 | HR 74 | Temp 98.2°F | Resp 16 | Ht 68.0 in | Wt 149.0 lb

## 2015-05-12 DIAGNOSIS — Z1211 Encounter for screening for malignant neoplasm of colon: Secondary | ICD-10-CM

## 2015-05-12 DIAGNOSIS — E088 Diabetes mellitus due to underlying condition with unspecified complications: Secondary | ICD-10-CM

## 2015-05-12 LAB — CBC WITH DIFFERENTIAL/PLATELET
BASOS PCT: 0 %
Basophils Absolute: 0 cells/uL (ref 0–200)
Eosinophils Absolute: 164 cells/uL (ref 15–500)
Eosinophils Relative: 2 %
HCT: 47.6 % (ref 38.5–50.0)
HEMOGLOBIN: 15.3 g/dL (ref 13.2–17.1)
Lymphocytes Relative: 25 %
Lymphs Abs: 2050 cells/uL (ref 850–3900)
MCH: 26.9 pg — ABNORMAL LOW (ref 27.0–33.0)
MCHC: 32.1 g/dL (ref 32.0–36.0)
MCV: 83.7 fL (ref 80.0–100.0)
MONO ABS: 574 {cells}/uL (ref 200–950)
MPV: 8.7 fL (ref 7.5–12.5)
Monocytes Relative: 7 %
Neutro Abs: 5412 cells/uL (ref 1500–7800)
Neutrophils Relative %: 66 %
Platelets: 255 10*3/uL (ref 140–400)
RBC: 5.69 MIL/uL (ref 4.20–5.80)
RDW: 13.6 % (ref 11.0–15.0)
WBC: 8.2 10*3/uL (ref 3.8–10.8)

## 2015-05-12 LAB — COMPLETE METABOLIC PANEL WITH GFR
ALT: 16 U/L (ref 9–46)
AST: 16 U/L (ref 10–35)
Albumin: 4.3 g/dL (ref 3.6–5.1)
Alkaline Phosphatase: 57 U/L (ref 40–115)
BUN: 15 mg/dL (ref 7–25)
CHLORIDE: 103 mmol/L (ref 98–110)
CO2: 25 mmol/L (ref 20–31)
Calcium: 9.4 mg/dL (ref 8.6–10.3)
Creat: 1 mg/dL (ref 0.70–1.25)
GFR, Est Non African American: 81 mL/min (ref >=60)
Glucose, Bld: 168 mg/dL — ABNORMAL HIGH (ref 65–99)
Potassium: 4.6 mmol/L (ref 3.5–5.3)
Sodium: 140 mmol/L (ref 135–146)
Total Bilirubin: 0.6 mg/dL (ref 0.2–1.2)
Total Protein: 7.4 g/dL (ref 6.1–8.1)

## 2015-05-12 LAB — HEMOGLOBIN A1C
Hgb A1c MFr Bld: 7.2 % — ABNORMAL HIGH (ref <5.7)
Mean Plasma Glucose: 160 mg/dL

## 2015-05-12 LAB — LIPID PANEL
CHOL/HDL RATIO: 4.5 ratio (ref <=5.0)
CHOLESTEROL: 216 mg/dL — AB (ref 125–200)
HDL: 48 mg/dL (ref >=40)
LDL Cholesterol: 146 mg/dL — ABNORMAL HIGH (ref <130)
Triglycerides: 111 mg/dL (ref <150)
VLDL: 22 mg/dL (ref <30)

## 2015-05-12 MED ORDER — LISINOPRIL 10 MG PO TABS: 10.0000 mg | ORAL_TABLET | Freq: Every day | ORAL | Status: DC

## 2015-05-12 MED ORDER — GLUCOSE BLOOD VI STRP: ORAL_STRIP | Status: DC

## 2015-05-12 MED ORDER — SITAGLIPTIN PHOS-METFORMIN HCL 50-1000 MG PO TABS: 1.0000 | ORAL_TABLET | Freq: Two times a day (BID) | ORAL | Status: DC

## 2015-05-12 MED ORDER — TRUE METRIX AIR GLUCOSE METER DEVI: 1.0000 | Freq: Two times a day (BID) | Status: DC

## 2015-05-12 MED ORDER — LOVASTATIN 20 MG PO TABS: 20.0000 mg | ORAL_TABLET | Freq: Every day | ORAL | Status: DC

## 2015-05-12 MED FILL — **JANUMET 50-1000 MG TABLET: 50-1000 | 30 days supply | Qty: 60 | Fill #0

## 2015-05-12 MED FILL — TRUE METRIX TEST STRIP: 30 days supply | Qty: 100 | Fill #0

## 2015-05-12 MED FILL — ?LOVASTATIN 20MG TABLET: 20 | 30 days supply | Qty: 30 | Fill #0

## 2015-05-12 MED FILL — LISINOPRIL 10 MG TABLET: 10 | 30 days supply | Qty: 30 | Fill #0

## 2015-05-12 MED FILL — !TRUE METRIX BLOOD GLUCOSE: 1 days supply | Qty: 1 | Fill #0

## 2015-05-12 NOTE — Progress Notes (Signed)
Patient ID: Billy Torres, male   DOB: 1955/02/06, 60 y.o.   MRN: [id number]   Billy Torres, is a 60 y.o. male  [id number]  [id number]  DOB - August 01, 1955  CC:  Chief Complaint  Patient presents with  . Follow-up  . Diabetes  . Hypertension         HPI: Billy Torres is a 60 y.o. male here for  follow-up of diabetes. He was seen last here a little over a year ago by Mrs. Hollis.  He has been living out of town and has recently return. He reports taking his medications  He has been prescribed Janumet 50/1000 bid. He is also on lisinopril and Mevacor.He reports needing refills on testing supplies, janumet, mevacor and lisinopril. He reports not exercising regularly and not following any particular dietary program.  No Known Allergies Past Medical History  Diagnosis Date  . Diabetes mellitus without complication ([place])   . Hypertension   . Hyperlipidemia    Current Outpatient Prescriptions on File Prior to Visit  Medication Sig Dispense Refill  . aspirin EC 81 MG tablet Take 1 tablet (81 mg total) by mouth daily. (Patient not taking: Reported on 05/12/2015) 30 tablet 2  . atorvastatin (LIPITOR) 40 MG tablet Take 1 tablet (40 mg total) by mouth daily. (Patient not taking: Reported on 05/12/2015) 30 tablet 2  . clopidogrel (PLAVIX) 75 MG tablet Take 1 tablet (75 mg total) by mouth daily with breakfast. (Patient not taking: Reported on 02/28/2014) 30 tablet 0  . glucose blood (FREESTYLE LITE) test strip Use to check blood sugar up to three times daily as directed. Dx Code 250.00 (Patient not taking: Reported on 05/12/2015) 100 each 12  . glucose monitoring kit (FREESTYLE) monitoring kit 1 each by Does not apply route 4 (four) times daily - after meals and at bedtime. 1 month Diabetic Testing Supplies for QAC-QHS accuchecks.Any brand OK (Patient not taking: Reported on 05/12/2015) 1 each 1  . metFORMIN (GLUCOPHAGE) 500 MG tablet Take 1 tablet (500 mg total) by mouth 2 (two) times daily  with a meal. (Patient not taking: Reported on 05/12/2015) 60 tablet 0   No current facility-administered medications on file prior to visit.   History reviewed. No pertinent family history. Social History   Social History  . Marital Status: Divorced    Spouse Name: N/A  . Number of Children: N/A  . Years of Education: N/A   Occupational History  . Not on file.   Social History Main Topics  . Smoking status: Current Every Day Smoker -- 0.25 packs/day    Types: Cigarettes  . Smokeless tobacco: Not on file  . Alcohol Use: No  . Drug Use: No  . Sexual Activity: Not on file   Other Topics Concern  . Not on file   Social History Narrative    Review of Systems: Constitutional: Negative for weight change, appetite change, fatigue Skin: Negative for ulcerations. Eyes: Negative pain, visual disturbance Respiratory: Negative for cough, shortness of breath,   Cardiovascular: Negative for chest pain, palpitations, pedal edema  Abdominal:Negative for nausea, vomiting, diarhea,constipation Genitourinary: Negative for frequency, urgency, polyuria Neurological: Negative for numbness, tingling,pain in feet Psychiatric/Behavioral: Negative for depression, anxiety, confusion   Objective:   Filed Vitals:   05/12/15 0906  BP: 137/74  Pulse: 74  Temp: 98.2 F (36.8 C)  Resp: 16    Physical Exam: Constitutional: Patient appears well-developed and well-nourished. No distress. HENT: Normocephalic, atraumatic. Oropharynx is clear and moist.  Eyes: Conjunctivae  and EOM are normal. PERRLA, no scleral icterus. Neck: Normal ROM. Neck supple. No lymphadenopathy, No thyromegaly. CVS: RRR, S1/S2 +, no murmurs, no gallops, no rubs Pulmonary: Effort and breath sounds normal, no stridor, rhonchi, wheezes, rales.  Abdominal: Soft. Normoactive BS,, no distension, tenderness, rebound or guarding.  Musculoskeletal: Normal range of motion. No edema and no tenderness.  Neuro: Alert.Normal muscle  tone coordination. Non-focal Skin: Skin is warm and dry. No rash noted. Not diaphoretic. No erythema. No pallor. Psychiatric: Normal mood and affect. Behavior, judgment, thought content normal.  Lab Results  Component Value Date   WBC 9.4 06/27/2013   HGB 16.3 06/27/2013   HCT 48.0 06/27/2013   MCV 82.3 06/27/2013   PLT 241 06/27/2013   Lab Results  Component Value Date   CREATININE 1.04 02/28/2014   BUN 13 02/28/2014   NA 138 02/28/2014   K 4.3 02/28/2014   CL 100 02/28/2014   CO2 25 02/28/2014    Lab Results  Component Value Date   HGBA1C 10.2* 02/28/2014   Lipid Panel     Component Value Date/Time   CHOL 206* 02/28/2014 1532   TRIG 223* 02/28/2014 1532   HDL 38* 02/28/2014 1532   CHOLHDL 5.4 02/28/2014 1532   VLDL 45* 02/28/2014 1532   LDLCALC 123* 02/28/2014 1532       Assessment and plan:   Diabetes  -A1C -Continue diabetic medications -Continue ACE for renal protection -Continue statin for reduction of cardiac risks -Follow-up in 3 months for recheck   1. Diabetes mellitus due to underlying condition with complication, without long-term current use of insulin (HCC) - COMPLETE METABOLIC PANEL WITH GFR - CBC with Differential - Hemoglobin A1c - Lipid panel - Microalbumin, urine - sitaGLIPtin-metformin (JANUMET) 50-1000 MG tablet; Take 1 tablet by mouth 2 (two) times daily with a meal.  Dispense: 180 tablet; Refill: 1 - lisinopril (PRINIVIL,ZESTRIL) 10 MG tablet; Take 1 tablet (10 mg total) by mouth daily.  Dispense: 90 tablet; Refill: 1 - lovastatin (MEVACOR) 20 MG tablet; Take 1 tablet (20 mg total) by mouth at bedtime.  Dispense: 90 tablet; Refill: 1 - glucose blood test strip; Use as instructed  Dispense: 100 each; Refill: 12  2. Colon cancer screening  - POC Hemoccult Bld/Stl (3-Cd Home Screen); Future   Return in about 3 months (around 08/12/2015) for HTN, Diabetes, A1C.  The patient was given clear instructions to go to ER or return to medical  center if symptoms don't improve, worsen or new problems develop. The patient verbalized understanding       05/12/2015, 10:55 AM

## 2015-05-12 NOTE — Patient Instructions (Signed)
Have sent prescriptions to [facility] at [address] Come back for recheck in 3 months. Would advise applying for orange card while at [place], in case you do not get disability.

## 2015-05-13 LAB — MICROALBUMIN, URINE: Microalb, Ur: 2.1 mg/dL

## 2015-05-14 ENCOUNTER — Emergency Department (HOSPITAL_COMMUNITY): Payer: Medicaid Other

## 2015-05-14 ENCOUNTER — Observation Stay (HOSPITAL_COMMUNITY): Payer: Medicaid Other

## 2015-05-14 ENCOUNTER — Encounter (HOSPITAL_COMMUNITY): Payer: Self-pay | Admitting: Emergency Medicine

## 2015-05-14 ENCOUNTER — Inpatient Hospital Stay (HOSPITAL_COMMUNITY)
Admission: EM | Admit: 2015-05-14 | Discharge: 2015-05-17 | DRG: 065 | Disposition: A | Discharge status: Rehabilitation Facility | Payer: Medicaid Other | Attending: Family Medicine | Admitting: Family Medicine

## 2015-05-14 DIAGNOSIS — I69393 Ataxia following cerebral infarction: Secondary | ICD-10-CM | POA: Insufficient documentation

## 2015-05-14 DIAGNOSIS — G8194 Hemiplegia, unspecified affecting left nondominant side: Secondary | ICD-10-CM | POA: Diagnosis present

## 2015-05-14 DIAGNOSIS — I69322 Dysarthria following cerebral infarction: Secondary | ICD-10-CM | POA: Insufficient documentation

## 2015-05-14 DIAGNOSIS — E088 Diabetes mellitus due to underlying condition with unspecified complications: Secondary | ICD-10-CM | POA: Insufficient documentation

## 2015-05-14 DIAGNOSIS — R531 Weakness: Secondary | ICD-10-CM

## 2015-05-14 DIAGNOSIS — G459 Transient cerebral ischemic attack, unspecified: Secondary | ICD-10-CM | POA: Diagnosis present

## 2015-05-14 DIAGNOSIS — I639 Cerebral infarction, unspecified: Principal | ICD-10-CM

## 2015-05-14 DIAGNOSIS — F17210 Nicotine dependence, cigarettes, uncomplicated: Secondary | ICD-10-CM | POA: Diagnosis present

## 2015-05-14 DIAGNOSIS — I10 Essential (primary) hypertension: Secondary | ICD-10-CM | POA: Diagnosis present

## 2015-05-14 DIAGNOSIS — Z8673 Personal history of transient ischemic attack (TIA), and cerebral infarction without residual deficits: Secondary | ICD-10-CM

## 2015-05-14 DIAGNOSIS — R0602 Shortness of breath: Secondary | ICD-10-CM

## 2015-05-14 DIAGNOSIS — Z7982 Long term (current) use of aspirin: Secondary | ICD-10-CM

## 2015-05-14 DIAGNOSIS — E119 Type 2 diabetes mellitus without complications: Secondary | ICD-10-CM | POA: Diagnosis present

## 2015-05-14 DIAGNOSIS — Z7902 Long term (current) use of antithrombotics/antiplatelets: Secondary | ICD-10-CM

## 2015-05-14 DIAGNOSIS — Z7984 Long term (current) use of oral hypoglycemic drugs: Secondary | ICD-10-CM

## 2015-05-14 DIAGNOSIS — E785 Hyperlipidemia, unspecified: Secondary | ICD-10-CM | POA: Insufficient documentation

## 2015-05-14 DIAGNOSIS — Z720 Tobacco use: Secondary | ICD-10-CM | POA: Insufficient documentation

## 2015-05-14 LAB — GLUCOSE, CAPILLARY: Glucose-Capillary: 226 mg/dL — ABNORMAL HIGH (ref 65–99)

## 2015-05-14 LAB — I-STAT CHEM 8, ED
BUN: 15 mg/dL (ref 6–20)
CHLORIDE: 107 mmol/L (ref 101–111)
CREATININE: 0.9 mg/dL (ref 0.61–1.24)
Calcium, Ion: 1.15 mmol/L (ref 1.13–1.30)
GLUCOSE: 150 mg/dL — AB (ref 65–99)
HCT: 55 % — ABNORMAL HIGH (ref 39.0–52.0)
Hemoglobin: 18.7 g/dL — ABNORMAL HIGH (ref 13.0–17.0)
POTASSIUM: 3.9 mmol/L (ref 3.5–5.1)
Sodium: 142 mmol/L (ref 135–145)
TCO2: 23 mmol/L (ref 0–100)

## 2015-05-14 LAB — COMPREHENSIVE METABOLIC PANEL
ALBUMIN: 3.8 g/dL (ref 3.5–5.0)
ALK PHOS: 60 U/L (ref 38–126)
ALT: 19 U/L (ref 17–63)
AST: 18 U/L (ref 15–41)
Anion gap: 14 (ref 5–15)
BILIRUBIN TOTAL: 0.7 mg/dL (ref 0.3–1.2)
BUN: 12 mg/dL (ref 6–20)
CALCIUM: 9.4 mg/dL (ref 8.9–10.3)
CO2: 21 mmol/L — ABNORMAL LOW (ref 22–32)
CREATININE: 0.99 mg/dL (ref 0.61–1.24)
Chloride: 106 mmol/L (ref 101–111)
GFR calc Af Amer: >60 mL/min (ref >60)
GLUCOSE: 154 mg/dL — AB (ref 65–99)
Potassium: 4 mmol/L (ref 3.5–5.1)
Sodium: 141 mmol/L (ref 135–145)
TOTAL PROTEIN: 8.1 g/dL (ref 6.5–8.1)

## 2015-05-14 LAB — PROTIME-INR
INR: 1.04 (ref 0.00–1.49)
PROTHROMBIN TIME: 13.8 s (ref 11.6–15.2)

## 2015-05-14 LAB — DIFFERENTIAL
Basophils Absolute: 0 10*3/uL (ref 0.0–0.1)
Basophils Relative: 0 %
EOS PCT: 1 %
Eosinophils Absolute: 0.1 10*3/uL (ref 0.0–0.7)
Lymphocytes Relative: 21 %
Lymphs Abs: 1.8 10*3/uL (ref 0.7–4.0)
MONO ABS: 0.3 10*3/uL (ref 0.1–1.0)
Monocytes Relative: 4 %
NEUTROS ABS: 6.4 10*3/uL (ref 1.7–7.7)
Neutrophils Relative %: 74 %

## 2015-05-14 LAB — CBC
HCT: 48.7 % (ref 39.0–52.0)
Hemoglobin: 15.6 g/dL (ref 13.0–17.0)
MCH: 26.8 pg (ref 26.0–34.0)
MCHC: 32 g/dL (ref 30.0–36.0)
MCV: 83.7 fL (ref 78.0–100.0)
PLATELETS: 212 10*3/uL (ref 150–400)
RBC: 5.82 MIL/uL — ABNORMAL HIGH (ref 4.22–5.81)
RDW: 13.2 % (ref 11.5–15.5)
WBC: 8.6 10*3/uL (ref 4.0–10.5)

## 2015-05-14 LAB — I-STAT TROPONIN, ED: TROPONIN I, POC: 0 ng/mL (ref 0.00–0.08)

## 2015-05-14 LAB — CBG MONITORING, ED: Glucose-Capillary: 122 mg/dL — ABNORMAL HIGH (ref 65–99)

## 2015-05-14 LAB — TSH: TSH: 1.093 u[IU]/mL (ref 0.350–4.500)

## 2015-05-14 LAB — APTT: aPTT: 32 seconds (ref 24–37)

## 2015-05-14 MED ORDER — SODIUM CHLORIDE 0.9% FLUSH: 3.0000 mL | INTRAVENOUS | Status: DC | PRN

## 2015-05-14 MED ORDER — STROKE: EARLY STAGES OF RECOVERY BOOK
Freq: Once | Status: DC
  Filled 2015-05-14: qty 1

## 2015-05-14 MED ORDER — ASPIRIN EC 81 MG PO TBEC
81.0000 mg | DELAYED_RELEASE_TABLET | Freq: Every day | ORAL | Status: DC
  Administered 2015-05-15: 81 mg via ORAL
  Filled 2015-05-14: qty 1

## 2015-05-14 MED ORDER — SENNOSIDES-DOCUSATE SODIUM 8.6-50 MG PO TABS: 1.0000 | ORAL_TABLET | Freq: Every evening | ORAL | Status: DC | PRN

## 2015-05-14 MED ORDER — LISINOPRIL 10 MG PO TABS: 10.0000 mg | ORAL_TABLET | Freq: Every day | ORAL | Status: DC

## 2015-05-14 MED ORDER — INSULIN ASPART 100 UNIT/ML ~~LOC~~ SOLN
0.0000 [IU] | Freq: Three times a day (TID) | SUBCUTANEOUS | Status: DC
  Administered 2015-05-15: 3 [IU] via SUBCUTANEOUS
  Administered 2015-05-15 – 2015-05-16 (×3): 2 [IU] via SUBCUTANEOUS
  Administered 2015-05-17: 3 [IU] via SUBCUTANEOUS

## 2015-05-14 MED ORDER — SODIUM CHLORIDE 0.9% FLUSH
3.0000 mL | Freq: Two times a day (BID) | INTRAVENOUS | Status: DC
  Administered 2015-05-14 – 2015-05-16 (×3): 3 mL via INTRAVENOUS

## 2015-05-14 MED ORDER — SODIUM CHLORIDE 0.9 % IV SOLN: 250.0000 mL | INTRAVENOUS | Status: DC | PRN

## 2015-05-14 MED ORDER — CLOPIDOGREL BISULFATE 75 MG PO TABS
75.0000 mg | ORAL_TABLET | Freq: Every day | ORAL | Status: DC
  Administered 2015-05-14 – 2015-05-17 (×4): 75 mg via ORAL
  Filled 2015-05-14 (×4): qty 1

## 2015-05-14 MED ORDER — ATORVASTATIN CALCIUM 40 MG PO TABS
40.0000 mg | ORAL_TABLET | Freq: Every day | ORAL | Status: DC
  Administered 2015-05-15: 40 mg via ORAL
  Filled 2015-05-14: qty 1

## 2015-05-14 NOTE — ED Notes (Signed)
Starting yesterday at 0900 had left arm and leg weakness, persisted all day. Woke up this morning and felt the same weakness still present on the left side. Got up and went to bathroom and fell in the bathroom due to left leg weakness. Denies injury from fall, states did not hit head. Denies pain.

## 2015-05-14 NOTE — ED Notes (Signed)
CHECKED CBG [facility], [place]

## 2015-05-14 NOTE — ED Provider Notes (Signed)
CSN: [id number]     Arrival date & time 05/14/15  1255 History   First MD Initiated Contact with Patient 05/14/15 1343     Chief Complaint  Patient presents with  . Extremity Weakness     (Consider location/radiation/quality/duration/timing/severity/associated sxs/prior Treatment) HPI Patient presents with left-sided weakness starting yesterday morning upon waking. States that it improved throughout the day. He awoke again this morning with left-sided weakness. States he fell in the bathroom due to his weakness. No injuries from the hall fall. No head trauma. States that his left-sided weakness has gradually improved. Denies any numbness or dizziness. No neck pain. Patient does take Plavix. Past Medical History  Diagnosis Date  . Diabetes mellitus without complication ([place])   . Hypertension   . Hyperlipidemia    Past Surgical History  Procedure Laterality Date  . Neck surgery     History reviewed. No pertinent family history. Social History  Substance Use Topics  . Smoking status: Current Every Day Smoker -- 0.25 packs/day    Types: Cigarettes  . Smokeless tobacco: None  . Alcohol Use: No    Review of Systems  Constitutional: Negative for fever and chills.  Respiratory: Negative for shortness of breath.   Cardiovascular: Negative for chest pain.  Gastrointestinal: Negative for nausea, vomiting, abdominal pain and diarrhea.  Musculoskeletal: Negative for back pain and neck pain.  Skin: Negative for rash and wound.  Neurological: Positive for weakness. Negative for syncope, light-headedness, numbness and headaches.  All other systems reviewed and are negative.     Allergies  Review of patient's allergies indicates no known allergies.  Home Medications   Prior to Admission medications   Medication Sig Start Date End Date Taking? Authorizing Provider  L-Arginine 500 MG CAPS Take 1 capsule by mouth daily.    Yes Historical Provider, MD  Multiple Vitamins-Minerals  (CENTRUM ADULTS PO) Take by mouth.   Yes Historical Provider, MD  aspirin EC 81 MG tablet Take 1 tablet (81 mg total) by mouth daily. Patient not taking: Reported on 05/12/2015 02/28/14   Dorena Dew, FNP  atorvastatin (LIPITOR) 40 MG tablet Take 1 tablet (40 mg total) by mouth daily. Patient not taking: Reported on 05/12/2015 03/03/14   Dorena Dew, FNP  Blood Glucose Monitoring Suppl (TRUE METRIX AIR GLUCOSE METER) DEVI 1 Device by Does not apply route 2 (two) times daily. 05/12/15   Micheline Chapman, NP  clopidogrel (PLAVIX) 75 MG tablet Take 1 tablet (75 mg total) by mouth daily with breakfast. Patient not taking: Reported on 02/28/2014 06/29/13   Thurnell Lose, MD  glucose blood (TRUE METRIX BLOOD GLUCOSE TEST) test strip Use as instructed 05/12/15   Micheline Chapman, NP  lisinopril (PRINIVIL,ZESTRIL) 10 MG tablet Take 1 tablet (10 mg total) by mouth daily. 05/12/15   Micheline Chapman, NP  lovastatin (MEVACOR) 20 MG tablet Take 1 tablet (20 mg total) by mouth at bedtime. 05/12/15   Micheline Chapman, NP  metFORMIN (GLUCOPHAGE) 500 MG tablet Take 1 tablet (500 mg total) by mouth 2 (two) times daily with a meal. 02/28/14   Dorena Dew, FNP  sitaGLIPtin-metformin (JANUMET) 50-1000 MG tablet Take 1 tablet by mouth 2 (two) times daily with a meal. 05/12/15   Micheline Chapman, NP   BP 162/90 mmHg  Pulse 82  Temp(Src) 97.9 F (36.6 C) (Oral)  Resp 22  Ht 5\' 8"  (1.727 m)  Wt 149 lb (67.586 kg)  BMI 22.66 kg/m2  SpO2 98% Physical  Exam  Constitutional: He is oriented to person, place, and time. He appears well-developed and well-nourished. No distress.  HENT:  Head: Normocephalic and atraumatic.  Mouth/Throat: Oropharynx is clear and moist.  No evidence of any head trauma. Midface stable. No malocclusion.  Eyes: EOM are normal. Pupils are equal, round, and reactive to light.  Neck: Normal range of motion. Neck supple.  No posterior midline cervical tenderness to palpation.   Cardiovascular: Normal rate and regular rhythm.  Exam reveals no gallop and no friction rub.   No murmur heard. Pulmonary/Chest: Effort normal and breath sounds normal. No respiratory distress. He has no wheezes. He has no rales. He exhibits no tenderness.  Abdominal: Soft. Bowel sounds are normal. He exhibits no distension and no mass. There is no tenderness. There is no rebound and no guarding.  Musculoskeletal: Normal range of motion. He exhibits no edema or tenderness.  No midline thoracic or lumbar tenderness. No lower extremity asymmetry, swelling or tenderness.  Neurological: He is alert and oriented to person, place, and time.  Patient is alert and oriented x3 with clear, goal oriented speech. Patient has 5/5 motor in all extremities. Sensation is intact to light touch. Bilateral finger-to-nose is normal with no signs of dysmetria.   Skin: Skin is warm and dry. No rash noted. No erythema.  Psychiatric: He has a normal mood and affect. His behavior is normal.  Nursing note and vitals reviewed.   ED Course  Procedures (including critical care time) Labs Review Labs Reviewed  CBC - Abnormal; Notable for the following:    RBC 5.82 (*)    All other components within normal limits  COMPREHENSIVE METABOLIC PANEL - Abnormal; Notable for the following:    CO2 21 (*)    Glucose, Bld 154 (*)    All other components within normal limits  CBG MONITORING, ED - Abnormal; Notable for the following:    Glucose-Capillary 122 (*)    All other components within normal limits  I-STAT CHEM 8, ED - Abnormal; Notable for the following:    Glucose, Bld 150 (*)    Hemoglobin 18.7 (*)    HCT 55.0 (*)    All other components within normal limits  PROTIME-INR  APTT  DIFFERENTIAL  I-STAT TROPOININ, ED    Imaging Review Ct Head Wo Contrast  05/14/2015  CLINICAL DATA:  LEFT side weakness which has persisted since Saturday, history hypertension, diabetes mellitus, smoker EXAM: CT HEAD WITHOUT  CONTRAST TECHNIQUE: Contiguous axial images were obtained from the base of the skull through the vertex without intravenous contrast. COMPARISON:  06/27/2013 FINDINGS: Generalized atrophy. Normal ventricular morphology. No midline shift or mass effect. Old lacunar infarct anterior LEFT basal ganglia and anterior limb of LEFT internal capsule. Old RIGHT periventricular white matter infarct and frontal region. Small vessel chronic ischemic changes of deep cerebral white matter. No intracranial hemorrhage, mass lesion or evidence acute infarction. No extra-axial fluid collections. Tiny air-fluid level RIGHT maxillary sinus with minimal mucous in LEFT sphenoid sinus. Bones, mastoid air cells, and remaining sinuses unremarkable. IMPRESSION: Atrophy with small vessel chronic ischemic changes of deep cerebral white matter. Old lacunar infarcts RIGHT periventricular white matter and anterior LEFT basal ganglia/internal capsule. Electronically Signed   By: Lavonia Dana M.D.   On: 05/14/2015 14:54   I have personally reviewed and evaluated these images and lab results as part of my medical decision-making.   EKG Interpretation   Date/Time:  Sunday May 14 2015 13:18:34 EDT Ventricular Rate:  83 PR  Interval:  144 QRS Duration: 82 QT Interval:  360 QTC Calculation: 423 R Axis:   50 Text Interpretation:  Normal sinus rhythm Normal ECG No significant change  since last tracing Confirmed by Christy Gentles  MD, Elenore Rota ([id number]) on 05/14/2015  1:23:25 PM      MDM   Final diagnoses:  Left-sided weakness   Discussed with Dr. Leonel Ramsay   Discussed with family medicine. Will admit to telemetry for further workup of TIA/CVA.    Julianne Rice, MD 05/14/15 [phone]

## 2015-05-14 NOTE — H&P (Signed)
[facility] Admission History and Physical Service Pager: [phone]  Patient name: Billy Torres Medical record number: [id number] Date of birth: [birth year] Age: 60 y.o. Gender: male  Primary Care Provider: Sharon Seller, NP Consultants: None Code Status: Full  Chief Complaint: Weakness.   Assessment and Plan: Billy Torres is a 60 y.o. male presenting with weakness and concern for CVA. PMH is significant for HLD, CVA 2015 without residual deficits, DMII.   # TIA / CVA- pt. With symptom onset yesterday am, with persistence of symptoms again today. These include left sided weakness. He had CVA with left sided weakness in 2015 that has resolved. He has been on Plavix and was supposed to be on ASA with this. He has not been taking his plavix recently and was out of it for 1 month. Was also off of ASA. Has been having palpitations. CT head negative. MRI positive for small nonhemorrhagic infarct R corona radiata posterior limb internal capsule. Neuro exam with very slight left sided weakness otherwise no deficits.  - Admit to tele, Dr. Nori Riis attending - CVA , will get MRA as well.  - Watching for intermittent arrhythmia on tele.  - Echo - Carotid doppler - Continue lipitor.  - Plavix, ASA - permissive HTN  - Check lipid, A1C, TSH - EKG in the am.  - Consider neuro consult in the AM  - PT/OT/ST - RN swallow screen.  - Neuro checks q4 hr.   # HLD  - Check lipids - Continue lipitor.   # DMII  - check A1C - Moderate SSI.  - Holding metformin, and janumet. Taking janumet sporadically at home.     FEN/GI: Diet pending RN swallow screen.  Prophylaxis: SCD's.   Disposition: Pending further workup and PT/OT eval.   History of Present Illness:  Billy Torres is a 60 y.o. male presenting with weakness for two days. Woke up two days ago and felt weak on his left side predominantly in his leg. Stumbled getting out of bed, and felt that he had a  hard time walking throughout the day. Fell in the bathtub due to left sided weakness. He did not hit his head and had no injury. It was a controlled fall. He felt like his symptoms improved somewhat as he went to bed. He did not have any UE weakness, and was able to carry out routine daily activities. He went to bed last night, and woke up this am with similar symptoms except they were not getting better. Weakness on the left leg, no arm weakness. He came to the ED for evaluation. He did not have any numbness, tingling, facial droop, slurring of his speech. He remembers the events chronologically. He has had a stroke in the past with left sided deficits. He was supposed to be on plavix and aspirin for stroke prevention, but ran out one month ago and just got plavix refilled and started taking on Friday last week. Additionally, he does mention periodic "chest thumps" that feel like a very hard heart beat. These occur every 15-20 minutes he says. He denies frank palpitations, flutters, or chest pain. He has not had chest pain or SOB with exertion. He does not ever remember having funny heart beats in the past. Family hx of stroke, heart disease (unclear what this is) in mother and father, and DMII. He continues to smoke 1/4 ppd, and drinks ETOH rarely. Recently moved back to [place] and reestablished with his PCP which was part of  the reason he ran out of plavix. Has never seen Neurology in [place] before.   Review Of Systems: Per HPI with the following additions: None.  Otherwise the remainder of the systems were negative.  Patient Active Problem List   Diagnosis Date Noted  . Left-sided weakness 05/14/2015  . Right shoulder pain 03/03/2014  . History of CVA (cerebrovascular accident) 02/28/2014  . Smoking 06/29/2013  . CVA (cerebral infarction) 06/29/2013  . Dyslipidemia 06/29/2013  . TIA (transient ischemic attack) 06/27/2013  . Diabetes ([place]) 06/27/2013    Past Medical History: Past Medical  History  Diagnosis Date  . Diabetes mellitus without complication ([place])   . Hypertension   . Hyperlipidemia     Past Surgical History: Past Surgical History  Procedure Laterality Date  . Neck surgery      Social History: Social History  Substance Use Topics  . Smoking status: Current Every Day Smoker -- 0.25 packs/day    Types: Cigarettes  . Smokeless tobacco: None  . Alcohol Use: No   Additional social history: None.   Please also refer to relevant sections of EMR.  Family History: History reviewed. No pertinent family history. (None. If not completed, MUST add something in)  Allergies and Medications: No Known Allergies No current facility-administered medications on file prior to encounter.   Current Outpatient Prescriptions on File Prior to Encounter  Medication Sig Dispense Refill  . L-Arginine 500 MG CAPS Take 1 capsule by mouth daily.     . Multiple Vitamins-Minerals (CENTRUM ADULTS PO) Take by mouth.    Marland Kitchen aspirin EC 81 MG tablet Take 1 tablet (81 mg total) by mouth daily. (Patient not taking: Reported on 05/12/2015) 30 tablet 2  . atorvastatin (LIPITOR) 40 MG tablet Take 1 tablet (40 mg total) by mouth daily. (Patient not taking: Reported on 05/12/2015) 30 tablet 2  . Blood Glucose Monitoring Suppl (TRUE METRIX AIR GLUCOSE METER) DEVI 1 Device by Does not apply route 2 (two) times daily. 1 Device 0  . clopidogrel (PLAVIX) 75 MG tablet Take 1 tablet (75 mg total) by mouth daily with breakfast. (Patient not taking: Reported on 02/28/2014) 30 tablet 0  . glucose blood (TRUE METRIX BLOOD GLUCOSE TEST) test strip Use as instructed 100 each 12  . lisinopril (PRINIVIL,ZESTRIL) 10 MG tablet Take 1 tablet (10 mg total) by mouth daily. 90 tablet 1  . lovastatin (MEVACOR) 20 MG tablet Take 1 tablet (20 mg total) by mouth at bedtime. 90 tablet 1  . metFORMIN (GLUCOPHAGE) 500 MG tablet Take 1 tablet (500 mg total) by mouth 2 (two) times daily with a meal. 60 tablet 0  .  sitaGLIPtin-metformin (JANUMET) 50-1000 MG tablet Take 1 tablet by mouth 2 (two) times daily with a meal. 180 tablet 1    Objective: BP 133/82 mmHg  Pulse 75  Temp(Src) 98.4 F (36.9 C) (Oral)  Resp 16  Ht 5\' 8"  (1.727 m)  Wt 149 lb (67.586 kg)  BMI 22.66 kg/m2  SpO2 98% Exam: General: NAD, AAOx3, resting comfortably in bed.  Eyes: EOMI, PERRLA ENTM: Nares patent, O/P clear.  Neck: FROM, SUpple.  Cardiovascular: RRR, no MGR, normal S1/S2, 2+ distal pulses.  Respiratory: CTA bilaterally, appropriate rate, unlabored.  Abdomen: S, NT, ND, +BS.  MSK: nontender, no edema noted.  Skin: No rashes, no lesions.  Neuro: AAOx3, CN II-XII tested and grossly in tact, Motor 4.5/5 on LUE and LLE compared to right (very slight weakness), 2+ patellar / BR reflexes on the L and R,  Sensory in tact globally, Coordination in tact to heel / shin bilaterally, Gait deferred at this time.  Psych: Appropriate mood / affect.   Labs and Imaging: CBC BMET   Recent Labs Lab 05/14/15 1325 05/14/15 1350  WBC 8.6  --   HGB 15.6 18.7*  HCT 48.7 55.0*  PLT 212  --     Recent Labs Lab 05/14/15 1325 05/14/15 1350  NA 141 142  K 4.0 3.9  CL 106 107  CO2 21*  --   BUN 12 15  CREATININE 0.99 0.90  GLUCOSE 154* 150*  CALCIUM 9.4  --      MRI Head 5/7 IMPRESSION: Small acute nonhemorrhagic infarct posterior right corona radiata extending to the posterior superior aspect of the posterior limb of the right internal capsule.  Remote infarcts and chronic microvascular changes as noted above.  Global atrophy.  Paranasal sinus mucosal thickening.   CT Head 5/7:  IMPRESSION: Atrophy with small vessel chronic ischemic changes of deep cerebral white matter.  Old lacunar infarcts RIGHT periventricular white matter and anterior LEFT basal ganglia/internal capsule.  A1C - 7.2   EKG - NSR.   Aquilla Hacker, MD 05/14/2015, 6:50 PM PGY-2, [facility] pager:  [phone], text pages welcome

## 2015-05-14 NOTE — ED Notes (Signed)
Dr Yelverton at bedside.  

## 2015-05-14 NOTE — Progress Notes (Addendum)
Received from ED via stretcher, patient is alert and oriented, patient is asking for dinner and to use the phone; oriented patient to unit and unit routine; patient denies any headache, blurred vision, or dizziness. See admission assessment. Fall safety measures in place with bed alarm and explained to patient.

## 2015-05-15 ENCOUNTER — Observation Stay (HOSPITAL_COMMUNITY): Payer: Medicaid Other

## 2015-05-15 ENCOUNTER — Observation Stay (HOSPITAL_BASED_OUTPATIENT_CLINIC_OR_DEPARTMENT_OTHER): Payer: Medicaid Other

## 2015-05-15 ENCOUNTER — Encounter (HOSPITAL_COMMUNITY): Payer: Self-pay | Admitting: Neurology

## 2015-05-15 ENCOUNTER — Other Ambulatory Visit: Payer: Self-pay | Admitting: Family Medicine

## 2015-05-15 DIAGNOSIS — Z7982 Long term (current) use of aspirin: Secondary | ICD-10-CM | POA: Diagnosis not present

## 2015-05-15 DIAGNOSIS — Z7984 Long term (current) use of oral hypoglycemic drugs: Secondary | ICD-10-CM | POA: Diagnosis not present

## 2015-05-15 DIAGNOSIS — G8194 Hemiplegia, unspecified affecting left nondominant side: Secondary | ICD-10-CM | POA: Diagnosis present

## 2015-05-15 DIAGNOSIS — I639 Cerebral infarction, unspecified: Secondary | ICD-10-CM | POA: Diagnosis present

## 2015-05-15 DIAGNOSIS — Z7902 Long term (current) use of antithrombotics/antiplatelets: Secondary | ICD-10-CM | POA: Diagnosis not present

## 2015-05-15 DIAGNOSIS — Z8673 Personal history of transient ischemic attack (TIA), and cerebral infarction without residual deficits: Secondary | ICD-10-CM | POA: Diagnosis not present

## 2015-05-15 DIAGNOSIS — I10 Essential (primary) hypertension: Secondary | ICD-10-CM | POA: Diagnosis present

## 2015-05-15 DIAGNOSIS — G459 Transient cerebral ischemic attack, unspecified: Secondary | ICD-10-CM

## 2015-05-15 DIAGNOSIS — R531 Weakness: Secondary | ICD-10-CM | POA: Diagnosis not present

## 2015-05-15 DIAGNOSIS — F17210 Nicotine dependence, cigarettes, uncomplicated: Secondary | ICD-10-CM | POA: Diagnosis present

## 2015-05-15 DIAGNOSIS — E785 Hyperlipidemia, unspecified: Secondary | ICD-10-CM | POA: Diagnosis present

## 2015-05-15 DIAGNOSIS — E119 Type 2 diabetes mellitus without complications: Secondary | ICD-10-CM | POA: Diagnosis present

## 2015-05-15 HISTORY — PX: TRANSTHORACIC ECHOCARDIOGRAM: SHX275

## 2015-05-15 LAB — GLUCOSE, CAPILLARY
GLUCOSE-CAPILLARY: 137 mg/dL — AB (ref 65–99)
GLUCOSE-CAPILLARY: 102 mg/dL — AB (ref 65–99)
GLUCOSE-CAPILLARY: 162 mg/dL — AB (ref 65–99)
Glucose-Capillary: 109 mg/dL — ABNORMAL HIGH (ref 65–99)

## 2015-05-15 LAB — URINALYSIS, DIPSTICK ONLY
BILIRUBIN URINE: NEGATIVE
Glucose, UA: NEGATIVE mg/dL
Hgb urine dipstick: NEGATIVE
Ketones, ur: NEGATIVE mg/dL
LEUKOCYTES UA: NEGATIVE
NITRITE: NEGATIVE
PH: 5.5 (ref 5.0–8.0)
Protein, ur: NEGATIVE mg/dL
SPECIFIC GRAVITY, URINE: 1.022 (ref 1.005–1.030)

## 2015-05-15 LAB — ECHOCARDIOGRAM COMPLETE
Height: 68 in
Weight: 2384 oz

## 2015-05-15 MED ORDER — ATORVASTATIN CALCIUM 80 MG PO TABS
80.0000 mg | ORAL_TABLET | Freq: Every day | ORAL | Status: DC
  Administered 2015-05-16 – 2015-05-17 (×2): 80 mg via ORAL
  Filled 2015-05-15 (×2): qty 1

## 2015-05-15 MED ORDER — SODIUM CHLORIDE 0.9 % IV BOLUS (SEPSIS)
500.0000 mL | Freq: Once | INTRAVENOUS | Status: AC
  Administered 2015-05-15: 500 mL via INTRAVENOUS

## 2015-05-15 MED ORDER — ASPIRIN EC 325 MG PO TBEC
325.0000 mg | DELAYED_RELEASE_TABLET | Freq: Every day | ORAL | Status: DC
Start: 2015-05-16 — End: 2015-05-17
  Administered 2015-05-16 – 2015-05-17 (×2): 325 mg via ORAL
  Filled 2015-05-15 (×2): qty 1

## 2015-05-15 MED ORDER — ACETAMINOPHEN 325 MG PO TABS
650.0000 mg | ORAL_TABLET | Freq: Four times a day (QID) | ORAL | Status: DC | PRN
  Administered 2015-05-15: 650 mg via ORAL
  Filled 2015-05-15: qty 2

## 2015-05-15 NOTE — Progress Notes (Addendum)
Upon assessment patient is c/o numbness that returned in the night time to his left side, patient states, he thought it would resolve.  Now presenting with left arm drift, ataxia in the left upper extremity due to his c/o weakness; denies headache, left leg slightly weaker then the right leg; able to raise and resist gravity.  Family medicine paged with new neuro changes upon rising this am. No orders received presently.

## 2015-05-15 NOTE — NC FL2 (Signed)
[place] LEVEL OF CARE SCREENING TOOL     IDENTIFICATION  Patient Name: Billy Torres: 09-15-1955 Sex: male Admission Date (Current Location): 05/14/2015  [facility] and [place] Number:  Herbalist and Address:  [facility]. [facility], [place] [address] [place], [place] [address]      Provider Number: [id number]  Attending Physician Name and Address:  Dickie La, MD  Relative Name and Phone Number:  Larkin Ina, son, [phone]    Current Level of Care: Hospital Recommended Level of Care: [place] Prior Approval Number:    Date Approved/Denied:   PASRR Number: [id number]  Discharge Plan: SNF    Current Diagnoses: Patient Active Problem List   Diagnosis Date Noted  . Left-sided weakness 05/14/2015  . Right shoulder pain 03/03/2014  . History of CVA (cerebrovascular accident) 02/28/2014  . Smoking 06/29/2013  . CVA (cerebral infarction) 06/29/2013  . Dyslipidemia 06/29/2013  . TIA (transient ischemic attack) 06/27/2013  . Diabetes ([place]) 06/27/2013    Orientation RESPIRATION BLADDER Height & Weight     Self, Time, Situation, Place  Normal Continent Weight: 149 lb (67.586 kg) Height:  5\' 8"  (172.7 cm)  BEHAVIORAL SYMPTOMS/MOOD NEUROLOGICAL BOWEL NUTRITION STATUS      Continent Diet (Please see DC summary)  AMBULATORY STATUS COMMUNICATION OF NEEDS Skin   Limited Assist Verbally Normal                       Personal Care Assistance Level of Assistance  Bathing, Feeding, Dressing Bathing Assistance: Limited assistance Feeding assistance: Independent Dressing Assistance: Limited assistance     Functional Limitations Info             SPECIAL CARE FACTORS FREQUENCY  PT (By licensed PT), OT (By licensed OT)     PT Frequency: min 4x/week OT Frequency: min 2x/week            Contractures      Additional Factors Info  Code Status, Allergies, Insulin Sliding Scale Code Status Info:  Full Allergies Info: NKA   Insulin Sliding Scale Info: insulin aspart (novoLOG) injection 0-15 Units;       Current Medications (05/15/2015):  This is the current hospital active medication list Current Facility-Administered Medications  Medication Dose Route Frequency Provider Last Rate Last Dose  .  stroke: mapping our early stages of recovery book   Does not apply Once [employer], MD      . 0.9 %  sodium chloride infusion  250 mL Intravenous PRN Aquilla Hacker, MD      . aspirin EC tablet 81 mg  81 mg Oral Daily Aquilla Hacker, MD   81 mg at 05/15/15 1058  . atorvastatin (LIPITOR) tablet 40 mg  40 mg Oral Daily Aquilla Hacker, MD   40 mg at 05/15/15 1058  . clopidogrel (PLAVIX) tablet 75 mg  75 mg Oral Q breakfast Aquilla Hacker, MD   75 mg at 05/15/15 0736  . insulin aspart (novoLOG) injection 0-15 Units  0-15 Units Subcutaneous TID WC Aquilla Hacker, MD   2 Units at 05/15/15 0736  . senna-docusate (Senokot-S) tablet 1 tablet  1 tablet Oral QHS PRN York Ram Melancon, MD      . sodium chloride flush (NS) 0.9 % injection 3 mL  3 mL Intravenous Q12H Aquilla Hacker, MD   3 mL at 05/15/15 1000  . sodium chloride flush (NS) 0.9 % injection  3 mL  3 mL Intravenous PRN Aquilla Hacker, MD         Discharge Medications: Please see discharge summary for a list of discharge medications.  Relevant Imaging Results:  Relevant Lab Results:   Additional Information SSN:  [place], [place]

## 2015-05-15 NOTE — Progress Notes (Signed)
OT Cancellation Note  Patient Details Name: Billy Torres MRN: [id number] DOB: 1955-05-08   Cancelled Treatment:    Reason Eval/Treat Not Completed: Patient at procedure or test/ unavailable (MRI). RN reported that pt noted to have worsening L sided symptoms this morning. Will attempt to see later today if time allows.  Redmond Baseman, OTR/L Pager: [phone] 05/15/2015, 8:47 AM

## 2015-05-15 NOTE — Consult Note (Signed)
Requesting Physician: Dr. Nori Riis    Chief Complaint: Stroke    HPI:                                                                                                                                         Billy Torres is an 60 y.o. male with history of left MCA infarct in the past but symptoms had resolved. HE is supposed to be on Plavix but he has not been on that for one month l after he noted left sided weakness. MRI was obtained showing a "Small acute nonhemorrhagic infarct posterior right corona radiata extending to the posterior superior aspect of the posterior limb of the right internal capsule." He was noted to have cleared but this AM at 0100 he felt increased numbness. Charting by nurses noted no change. Morning nurse came on shift and noted a change since yesterday as far as weakness.  BP has remained stable. HE is currently going for a STAT MRI brain.  Date last known well: 5.6.2017 Time last known well: Unable to determine tPA Given: No: out of window   Past Medical History  Diagnosis Date  . Diabetes mellitus without complication ([place])   . Hypertension   . Hyperlipidemia     Past Surgical History  Procedure Laterality Date  . Neck surgery      Family History  Problem Relation Age of Onset  . Hypertension Mother   . Hypertension Father    Social History:  reports that he has been smoking Cigarettes.  He has been smoking about 0.25 packs per day. He does not have any smokeless tobacco history on file. He reports that he does not drink alcohol or use illicit drugs.  Allergies: No Known Allergies  Medications:                                                                                                                           Prior to Admission:  Prescriptions prior to admission  Medication Sig Dispense Refill Last Dose  . L-Arginine 500 MG CAPS Take 1 capsule by mouth daily.    05/13/2015 at Unknown time  . Multiple Vitamins-Minerals (CENTRUM ADULTS PO) Take by  mouth.   05/13/2015 at Unknown time  . aspirin EC 81 MG tablet Take 1 tablet (81 mg total) by mouth daily. (Patient not taking: Reported on 05/12/2015) 30  tablet 2 Not Taking  . atorvastatin (LIPITOR) 40 MG tablet Take 1 tablet (40 mg total) by mouth daily. (Patient not taking: Reported on 05/12/2015) 30 tablet 2 Not Taking  . Blood Glucose Monitoring Suppl (TRUE METRIX AIR GLUCOSE METER) DEVI 1 Device by Does not apply route 2 (two) times daily. 1 Device 0   . clopidogrel (PLAVIX) 75 MG tablet Take 1 tablet (75 mg total) by mouth daily with breakfast. (Patient not taking: Reported on 02/28/2014) 30 tablet 0 Not Taking  . glucose blood (TRUE METRIX BLOOD GLUCOSE TEST) test strip Use as instructed 100 each 12   . lisinopril (PRINIVIL,ZESTRIL) 10 MG tablet Take 1 tablet (10 mg total) by mouth daily. 90 tablet 1   . lovastatin (MEVACOR) 20 MG tablet Take 1 tablet (20 mg total) by mouth at bedtime. 90 tablet 1   . metFORMIN (GLUCOPHAGE) 500 MG tablet Take 1 tablet (500 mg total) by mouth 2 (two) times daily with a meal. 60 tablet 0 Not Taking  . sitaGLIPtin-metformin (JANUMET) 50-1000 MG tablet Take 1 tablet by mouth 2 (two) times daily with a meal. 180 tablet 1    Scheduled: .  stroke: mapping our early stages of recovery book   Does not apply Once  . aspirin EC  81 mg Oral Daily  . atorvastatin  40 mg Oral Daily  . clopidogrel  75 mg Oral Q breakfast  . insulin aspart  0-15 Units Subcutaneous TID WC  . sodium chloride flush  3 mL Intravenous Q12H    ROS:                                                                                                                                       History obtained from the patient  General ROS: negative for - chills, fatigue, fever, night sweats, weight gain or weight loss Psychological ROS: negative for - behavioral disorder, hallucinations, memory difficulties, mood swings or suicidal ideation Ophthalmic ROS: negative for - blurry vision, double vision, eye  pain or loss of vision ENT ROS: negative for - epistaxis, nasal discharge, oral lesions, sore throat, tinnitus or vertigo Allergy and Immunology ROS: negative for - hives or itchy/watery eyes Hematological and Lymphatic ROS: negative for - bleeding problems, bruising or swollen lymph nodes Endocrine ROS: negative for - galactorrhea, hair pattern changes, polydipsia/polyuria or temperature intolerance Respiratory ROS: negative for - cough, hemoptysis, shortness of breath or wheezing Cardiovascular ROS: negative for - chest pain, dyspnea on exertion, edema or irregular heartbeat Gastrointestinal ROS: negative for - abdominal pain, diarrhea, hematemesis, nausea/vomiting or stool incontinence Genito-Urinary ROS: negative for - dysuria, hematuria, incontinence or urinary frequency/urgency Musculoskeletal ROS: negative for - joint swelling or muscular weakness Neurological ROS: as noted in HPI Dermatological ROS: negative for rash and skin lesion changes  Neurologic Examination:  Blood pressure 127/83, pulse 69, temperature 98 F (36.7 C), temperature source Oral, resp. rate 18, height 5\' 8"  (1.727 m), weight 67.586 kg (149 lb), SpO2 100 %.  HEENT-  Normocephalic, no lesions, without obvious abnormality.  Normal external eye and conjunctiva.  Normal TM's bilaterally.  Normal auditory canals and external ears. Normal external nose, mucus membranes and septum.  Normal pharynx. Cardiovascular- S1, S2 normal, pulses palpable throughout   Lungs- chest clear, no wheezing, rales, normal symmetric air entry Abdomen- normal findings: bowel sounds normal Extremities- no edema Lymph-no adenopathy palpable Musculoskeletal-no joint tenderness, deformity or swelling Skin-warm and dry, no hyperpigmentation, vitiligo, or suspicious lesions  Neurological Examination Mental Status: Alert, oriented, thought content  appropriate.  Speech fluent without evidence of aphasia.  Able to follow 3 step commands without difficulty. Cranial Nerves: II:  Visual fields grossly normal, pupils equal, round, reactive to light and accommodation III,IV, VI: ptosis not present, extra-ocular motions intact bilaterally V,VII: smile symmetric, facial light touch sensation normal bilaterally VIII: hearing normal bilaterally IX,X: uvula rises symmetrically XI: bilateral shoulder shrug XII: midline tongue extension Motor: Right : Upper extremity   5/5    Left:     Upper extremity   4/5  Lower extremity   5/5     Lower extremity   r/5 Tone and bulk:normal tone throughout; no atrophy noted Sensory: Pinprick and light touch intact throughout, bilaterally Deep Tendon Reflexes: 2+ and symmetric throughout Plantars: Right: downgoing   Left: downgoing Cerebellar: normal finger-to-nose on the right and some end point ataxia noted but this is more secondary to his weakness in his arm. ,  and normal heel-to-shin test on the right and again some weakness associated ataxia on the left leg Gait: not tested       Lab Results: Basic Metabolic Panel:  Recent Labs Lab 05/12/15 0950 05/14/15 1325 05/14/15 1350  NA 140 141 142  K 4.6 4.0 3.9  CL 103 106 107  CO2 25 21*  --   GLUCOSE 168* 154* 150*  BUN 15 12 15   CREATININE 1.00 0.99 0.90  CALCIUM 9.4 9.4  --     Liver Function Tests:  Recent Labs Lab 05/12/15 0950 05/14/15 1325  AST 16 18  ALT 16 19  ALKPHOS 57 60  BILITOT 0.6 0.7  PROT 7.4 8.1  ALBUMIN 4.3 3.8   No results for input(s): LIPASE, AMYLASE in the last 168 hours. No results for input(s): AMMONIA in the last 168 hours.  CBC:  Recent Labs Lab 05/12/15 0950 05/14/15 1325 05/14/15 1350  WBC 8.2 8.6  --   NEUTROABS 5412 6.4  --   HGB 15.3 15.6 18.7*  HCT 47.6 48.7 55.0*  MCV 83.7 83.7  --   PLT 255 212  --     Cardiac Enzymes: No results for input(s): CKTOTAL, CKMB, CKMBINDEX, TROPONINI  in the last 168 hours.  Lipid Panel:  Recent Labs Lab 05/12/15 0950  CHOL 216*  TRIG 111  HDL 48  CHOLHDL 4.5  VLDL 22  LDLCALC 146*    CBG:  Recent Labs Lab 05/14/15 1526 05/14/15 2303 05/15/15 0628  GLUCAP 122* 226* 137*    Microbiology: No results found for this or any previous visit.  Coagulation Studies:  Recent Labs  05/14/15 1440  LABPROT 13.8  INR 1.04    Imaging: Dg Chest 2 View  05/14/2015  CLINICAL DATA:  60 year old male with acute shortness of breath. EXAM: CHEST  2 VIEW COMPARISON:  10/13/2007 chest radiograph FINDINGS:  Upper limits normal heart size again noted as well as small peribronchial thickening. There is no evidence of focal airspace disease, pulmonary edema, suspicious pulmonary nodule/mass, pleural effusion, or pneumothorax. No acute bony abnormalities are identified. IMPRESSION: No active cardiopulmonary disease. Electronically Signed   By: Margarette Canada M.D.   On: 05/14/2015 21:59   Ct Head Wo Contrast  05/14/2015  CLINICAL DATA:  LEFT side weakness which has persisted since Saturday, history hypertension, diabetes mellitus, smoker EXAM: CT HEAD WITHOUT CONTRAST TECHNIQUE: Contiguous axial images were obtained from the base of the skull through the vertex without intravenous contrast. COMPARISON:  06/27/2013 FINDINGS: Generalized atrophy. Normal ventricular morphology. No midline shift or mass effect. Old lacunar infarct anterior LEFT basal ganglia and anterior limb of LEFT internal capsule. Old RIGHT periventricular white matter infarct and frontal region. Small vessel chronic ischemic changes of deep cerebral white matter. No intracranial hemorrhage, mass lesion or evidence acute infarction. No extra-axial fluid collections. Tiny air-fluid level RIGHT maxillary sinus with minimal mucous in LEFT sphenoid sinus. Bones, mastoid air cells, and remaining sinuses unremarkable. IMPRESSION: Atrophy with small vessel chronic ischemic changes of deep cerebral  white matter. Old lacunar infarcts RIGHT periventricular white matter and anterior LEFT basal ganglia/internal capsule. Electronically Signed   By: Lavonia Dana M.D.   On: 05/14/2015 14:54   Mr Brain Wo Contrast  05/14/2015  CLINICAL DATA:  60 year old diabetic hypertensive male fell in shower. Left-sided weakness started yesterday. Subsequent encounter. EXAM: MRI HEAD WITHOUT CONTRAST TECHNIQUE: Multiplanar, multiecho pulse sequences of the brain and surrounding structures were obtained without intravenous contrast. COMPARISON:  05/14/2015 CT.  06/28/2013 MR. FINDINGS: Small acute nonhemorrhagic infarct posterior right corona radiata extending to the posterior superior aspect of the posterior limb of the right internal capsule. Remote left lenticular nucleus/ caudate infarct with blood-stained encephalomalacia. Remote mid right corona radiata infarct with blood-stained encephalomalacia. No other areas of intracranial hemorrhage. Moderate to marked chronic microvascular changes. Mild global atrophy without hydrocephalus. No intracranial mass lesion noted on this unenhanced exam. Major intracranial vascular structures are patent. Paranasal sinus mucosal thickening. Nasal turbinate mucosal thickening. Minimal exophthalmos otherwise orbital structures unremarkable. Mild spinal stenosis C3-4 with prominent left facet joint degenerative changes. Minimal sclerosis C2 and C3 vertebral body without significant change. Cervical medullary junction unremarkable. IMPRESSION: Small acute nonhemorrhagic infarct posterior right corona radiata extending to the posterior superior aspect of the posterior limb of the right internal capsule. Remote infarcts and chronic microvascular changes as noted above. Global atrophy. Paranasal sinus mucosal thickening. Electronically Signed   By: Genia Del M.D.   On: 05/14/2015 16:54   Mr Jodene Nam Head/brain Wo Cm  05/14/2015  CLINICAL DATA:  Acute infarct. EXAM: MRA HEAD WITHOUT CONTRAST  TECHNIQUE: Angiographic images of the Circle of Willis were obtained using MRA technique without intravenous contrast. COMPARISON:  06/28/2013 FINDINGS: Symmetric carotid and vertebral arteries. Symmetric and normal pattern vertebrobasilar branching. No communicating branches are noted. No proximal occlusion. No flow limiting stenosis in the carotid, vertebral, or basilar arteries. Shallow outpouching from the right supraclinoid ICA projecting inferiorly is usually unresolved infundibulum given shape. There is atherosclerotic type irregularity of bilateral MCA branches with high-grade and progressive stenosis of inferior division right M2 branch. Proximal high-grade narrowing of the first branch left MCA is stable from prior. No notable posterior circulation stenosis. IMPRESSION: 1. No acute arterial finding. 2. Intracranial atherosclerosis with high-grade and progressive right M2 segment stenosis compared to 2015. Electronically Signed   By: Neva Seat.D.  On: 05/14/2015 20:46       Assessment and plan discussed with with attending physician and they are in agreement.    Etta Quill PA-C [facility] [phone]  05/15/2015, 8:58 AM   Assessment: 60 y.o. male with acute right IC and carona radiata infarct. Liekly small vessel disease with some progression. I would favor giving small bolus and continuing fluids given some worsening overnight.   Stroke Risk Factors - diabetes mellitus, hyperlipidemia and hypertension  Recommend: 1. Statin for LDL 146 2. DM management per primary team.  3. Frequent neuro checks 4. Echocardiogram 5. Carotid dopplers 6. Prophylactic therapy-Antiplatelet med: Plavix 75mg  daily 7. Risk factor modification 8. Telemetry monitoring 9. PT consult, OT consult, Speech consult 10. please page stroke NP  Or  PA  Or MD  M-F from 8am -4 pm starting 5/9 as this patient will be followed by the stroke team at this point.   You can look them up on  [email or web address]    Roland Rack, MD [facility] [phone]  If 7pm- 7am, please page neurology on call as listed in [place].

## 2015-05-15 NOTE — Progress Notes (Signed)
*  PRELIMINARY RESULTS* Echocardiogram 2D Echocardiogram has been performed.  Leavy Cella 05/15/2015, 11:05 AM

## 2015-05-15 NOTE — Evaluation (Signed)
Physical Therapy Evaluation Patient Details Name: Billy Torres MRN: [id number] DOB: 06/05/1955 Today's Date: 05/15/2015   History of Present Illness  Billy Torres is an 60 y.o. male with history of left MCA infarct in the past but symptoms had resolved. HE is supposed to be on Plavix but he has not been on that for one month l after he noted left sided weakness. MRI was obtained showing a "Small acute nonhemorrhagic infarct posterior right corona radiata extending to the posterior superior aspect of the posterior limb of the right internal capsule." on 5/8 pt reports he L sided weakness is worse. RN aware and contacting primary team  Clinical Impression  Pt admitted with above diagnosis. Pt currently with functional limitations due to the deficits listed below (see PT Problem List). Pt with L UE weakness and L LE ataxia requiring assist for transfers and ambulation. Pt demo's excellent rehab potential and would greatly benefit from CIR upon d/c and anticipate he could achieve safe mod I level of function to return home with family. Pt will benefit from skilled PT to increase their independence and safety with mobility to allow discharge to the venue listed below.       Follow Up Recommendations CIR    Equipment Recommendations   (TBD)    Recommendations for Other Services Rehab consult     Precautions / Restrictions Precautions Precautions: Fall Precaution Comments: progressive L sided weakness Restrictions Weight Bearing Restrictions: No      Mobility  Bed Mobility Overal bed mobility: Needs Assistance Bed Mobility: Supine to Sit     Supine to sit: Min guard     General bed mobility comments: increased time but able to complete without bed rail  Transfers Overall transfer level: Needs assistance Equipment used: 1 person hand held assist Transfers: Sit to/from Stand Sit to Stand: Min assist         General transfer comment: strong lean to the Left, minA to  maintain midline  Ambulation/Gait Ambulation/Gait assistance: Mod assist;Max assist Ambulation Distance (Feet): 20 Feet Assistive device: 1 person hand held assist;2 person hand held assist Gait Pattern/deviations: Step-to pattern;Decreased step length - left;Decreased stance time - left;Decreased stride length;Ataxic;Staggering left;Narrow base of support Gait velocity: slow Gait velocity interpretation: Below normal speed for age/gender General Gait Details: pt with poor L LE sequencing during gait, decerased step height, strong L lateral lean requiring maxA to maintain mindline  Science writer    Modified Rankin (Stroke Patients Only) Modified Rankin (Stroke Patients Only) Pre-Morbid Rankin Score: No significant disability Modified Rankin: Moderately severe disability     Balance Overall balance assessment: Needs assistance Sitting-balance support: Feet supported;No upper extremity supported Sitting balance-Leahy Scale: Fair Sitting balance - Comments: pt attempted to don socks sitting EOB without LOB   Standing balance support: Single extremity supported;During functional activity Standing balance-Leahy Scale: Fair Standing balance comment: pt with strong lean to the left and unable to correct                             Pertinent Vitals/Pain Pain Assessment: No/denies pain    Home Living Family/patient expects to be discharged to:: Private residence Living Arrangements: Children Available Help at Discharge: Family;Available PRN/intermittently Type of Home: House Home Access: Stairs to enter Entrance Stairs-Rails: None Entrance Stairs-Number of Steps: 2 Home Layout: One level Home Equipment: None  Prior Function Level of Independence: Independent         Comments: doens't use AD, has liscense but doesn't drive. minimal cooking, only eats crackers until step-daughter or son gets home     Hand Dominance         Extremity/Trunk Assessment   Upper Extremity Assessment: LUE deficits/detail       LUE Deficits / Details: grossly 3-/5   Lower Extremity Assessment: LLE deficits/detail   LLE Deficits / Details: grossly 4/5  Cervical / Trunk Assessment: Normal  Communication   Communication: Expressive difficulties (slowed)  Cognition Arousal/Alertness: Awake/alert Behavior During Therapy: WFL for tasks assessed/performed Overall Cognitive Status: Impaired/Different from baseline Area of Impairment: Problem solving             Problem Solving: Slow processing General Comments: overalls low to repsond, increased time to figure out date but was able to without verbal cues    General Comments      Exercises        Assessment/Plan    PT Assessment Patient needs continued PT services  PT Diagnosis Difficulty walking;Generalized weakness;Abnormality of gait   PT Problem List Decreased strength;Decreased balance;Decreased mobility;Decreased activity tolerance;Decreased range of motion;Decreased coordination  PT Treatment Interventions DME instruction;Gait training;Stair training;Functional mobility training;Therapeutic activities;Therapeutic exercise;Balance training   PT Goals (Current goals can be found in the Care Plan section) Acute Rehab PT Goals Patient Stated Goal: get better PT Goal Formulation: With patient Time For Goal Achievement: 05/22/15 Potential to Achieve Goals: Good    Frequency Min 4X/week   Barriers to discharge Decreased caregiver support home alone during the day    Co-evaluation               End of Session Equipment Utilized During Treatment: Gait belt Activity Tolerance: Patient tolerated treatment well Patient left: in chair;with call bell/phone within reach;with chair alarm set;with nursing/sitter in room Nurse Communication: Mobility status    Functional Assessment Tool Used: clinical judgment Functional Limitation: Mobility: Walking and  moving around Mobility: Walking and Moving Around Current Status [phone]): At least 20 percent but less than 40 percent impaired, limited or restricted Mobility: Walking and Moving Around Goal Status [phone]): At least 1 percent but less than 20 percent impaired, limited or restricted    Time: 0745-0808 PT Time Calculation (min) (ACUTE ONLY): 23 min   Charges:   PT Evaluation $PT Eval Moderate Complexity: 1 Procedure PT Treatments $Gait Training: 8-22 mins   PT G Codes:   PT G-Codes **NOT FOR INPATIENT CLASS** Functional Assessment Tool Used: clinical judgment Functional Limitation: Mobility: Walking and moving around Mobility: Walking and Moving Around Current Status [id number]): At least 20 percent but less than 40 percent impaired, limited or restricted Mobility: Walking and Moving Around Goal Status [phone]): At least 1 percent but less than 20 percent impaired, limited or restricted    Kingsley Callander 05/15/2015, 9:45 AM   Kittie Plater, PT, DPT Pager #: [phone] Office #: [phone]

## 2015-05-15 NOTE — Care Management Note (Signed)
Case Management Note  Patient Details  Name: Billy Torres MRN: [id number] Date of Birth: 10-30-1955  Subjective/Objective:                    Action/Plan: Patient was admitted with CVA. Lives at home with family. Will follow for discharge needs pending PT/OT evals and physician orders.  Expected Discharge Date:                  Expected Discharge Plan:     In-House Referral:     Discharge planning Services     Post Acute Care Choice:    Choice offered to:     DME Arranged:    DME Agency:     HH Arranged:    HH Agency:     Status of Service:  In process, will continue to follow  Medicare Important Message Given:    Date Medicare IM Given:    Medicare IM give by:    Date Additional Medicare IM Given:    Additional Medicare Important Message give by:     If discussed at [place] of Stay Meetings, dates discussed:    Additional CommentsRolm Baptise, RN 05/15/2015, 12:08 PM [phone]

## 2015-05-15 NOTE — Progress Notes (Signed)
CALL PAGER [phone] for any questions or notifications regarding this patient  FMTS Attending Note: Dorcas Mcmurray MD I examined Mr. Billy Torres this afternoon. He does have weakness in LUE/LLe--4/5. He has some emotional lability as well--said he did not want to be like his brother who evidently had several strokes. His repeat MRI showed small increase in defect. One of his greatest concerns is financial. heis willing to ake meds but is currently out of work and having difficult times. We have asked SW/CM to work with him. Appreciate neurology care.

## 2015-05-15 NOTE — Progress Notes (Signed)
Family Medicine Teaching Service Daily Progress Note Intern Pager: [phone]  Patient name: Billy Torres Medical record number: [id number] Date of birth: 1955-01-09 Age: 60 y.o. Gender: male  Primary Care Provider: Sharon Seller, NP Consultants:  Neurology  Code Status: Full   Pt Overview and Major Events to Date:    Assessment and Plan: Billy Torres is a 60 y.o. male presenting with weakness and concern for CVA. PMH is significant for HLD, CVA 2015 without residual deficits, DMII.   # CVA- Increase neurologic findings this AM, with left sided upper extremity weakness. Has a hx of  CVA with left sided weakness in 2015 that has resolved. He has been on Plavix and was supposed to be on ASA with this. He has not been taking his plavix recently and was out of it for 1 month. Was also off of ASA. CT head negative. MRI positive for small nonhemorrhagic infarct R corona radiata posterior limb internal capsule. - Consulted Neurology  - Due to increased neurologic findings, stat MRI head this AM  - gave 500 cc bolus due normotensive blood pressures in the setting of worsening CVA in the AM  - Echo and Carotid doppler pending  - Permissive HTN  - Continue lipitor.  - Plavix, ASA  - Check lipid, A1C, TSH - EKG in the am.  - PT/OT/ST - RN swallow screen.  - Neuro checks q4 hr.   # HLD  - Check lipids - Continue lipitor.   # DMII  - check A1C - Moderate SSI.  - Holding metformin, and janumet. Taking janumet sporadically at home.   FEN/GI: Diet pending RN swallow screen.  Prophylaxis: SCD's.   Disposition: Med-surg   Subjective:  Patient states he had worsening neurologic symptoms that started 2 AM. Patient did not notify nurse at the time and staff only became aware of this morning once patient was revaluated by on-coming nurse.   Objective: Temp:  [98 F (36.7 C)-98.8 F (37.1 C)] 98.2 F (36.8 C) (05/08 1655) Pulse Rate:  [69-76] 76 (05/08 1655) Resp:   [16-18] 16 (05/08 1655) BP: (105-133)/(62-83) 121/71 mmHg (05/08 1655) SpO2:  [97 %-100 %] 100 % (05/08 1655) Physical Exam: General: Patient sitting in bed, NAD  Cardiovascular: RRR, no murmur or gallops  Respiratory: CTAB, no wheezes.  Abdomen: BS+, no ttp, no rebound  Extremities: No lower extremities edema  Neurologic: CN 2-12 intact, sensation in upper and lower extremities unchanged, reflexes 2+, Ataxic gait leaning to the left, upper left extremity 3/5 strengh and upper right upper extremity with 5/5 strength   Laboratory:  Recent Labs Lab 05/12/15 0950 05/14/15 1325 05/14/15 1350  WBC 8.2 8.6  --   HGB 15.3 15.6 18.7*  HCT 47.6 48.7 55.0*  PLT 255 212  --     Recent Labs Lab 05/12/15 0950 05/14/15 1325 05/14/15 1350  NA 140 141 142  K 4.6 4.0 3.9  CL 103 106 107  CO2 25 21*  --   BUN 15 12 15   CREATININE 1.00 0.99 0.90  CALCIUM 9.4 9.4  --   PROT 7.4 8.1  --   BILITOT 0.6 0.7  --   ALKPHOS 57 60  --   ALT 16 19  --   AST 16 18  --   GLUCOSE 168* 154* 150*      Imaging/Diagnostic Tests: Dg Chest 2 View  05/14/2015  CLINICAL DATA:  60 year old male with acute shortness of breath. EXAM: CHEST  2 VIEW COMPARISON:  10/13/2007 chest radiograph FINDINGS: Upper limits normal heart size again noted as well as small peribronchial thickening. There is no evidence of focal airspace disease, pulmonary edema, suspicious pulmonary nodule/mass, pleural effusion, or pneumothorax. No acute bony abnormalities are identified. IMPRESSION: No active cardiopulmonary disease. Electronically Signed   By: Margarette Canada M.D.   On: 05/14/2015 21:59   Mr Brain Wo Contrast  05/15/2015  CLINICAL DATA:  New onset altered mental status/ confusion. Increased left-sided weakness/ numbness. EXAM: MRI HEAD WITHOUT CONTRAST TECHNIQUE: Multiplanar, multiecho pulse sequences of the brain and surrounding structures were obtained without intravenous contrast. COMPARISON:  05/14/2015 FINDINGS: Acute  right lateral lenticulostriate territory infarct has minimally enlarged in the interim and extends from the posterior corona radiata to the posterior lentiform nucleus. No acute infarct is identified elsewhere. Chronic infarcts are again seen in the corona radiata and basal ganglia bilaterally, with associated chronic blood products better demonstrated on the prior 3T study. There is no evidence of acute intracranial hemorrhage, mass, midline shift, or extra-axial fluid collection. Mild generalized cerebral atrophy is noted. Patchy T2 hyperintensities throughout the cerebral white matter bilaterally are unchanged and compatible with moderate chronic small vessel ischemic disease. Orbits are unremarkable. A small amount of fluid is again seen in the right maxillary sinus with increased, mild mucosal thickening. Mucosal thickening is mild in the right frontal and left sphenoid sinuses and moderate in the right ethmoid air cells, slightly increased. Mastoid air cells are clear. Major intracranial vascular flow voids are preserved. IMPRESSION: 1. Minimal enlargement of acute right corona radiata/ basal ganglia region infarct. 2. Moderate chronic small vessel ischemic changes as above. Electronically Signed   By: Logan Bores M.D.   On: 05/15/2015 10:16   Mr Jodene Nam Head/brain Wo Cm  05/14/2015  CLINICAL DATA:  Acute infarct. EXAM: MRA HEAD WITHOUT CONTRAST TECHNIQUE: Angiographic images of the Circle of Willis were obtained using MRA technique without intravenous contrast. COMPARISON:  06/28/2013 FINDINGS: Symmetric carotid and vertebral arteries. Symmetric and normal pattern vertebrobasilar branching. No communicating branches are noted. No proximal occlusion. No flow limiting stenosis in the carotid, vertebral, or basilar arteries. Shallow outpouching from the right supraclinoid ICA projecting inferiorly is usually unresolved infundibulum given shape. There is atherosclerotic type irregularity of bilateral MCA branches  with high-grade and progressive stenosis of inferior division right M2 branch. Proximal high-grade narrowing of the first branch left MCA is stable from prior. No notable posterior circulation stenosis. IMPRESSION: 1. No acute arterial finding. 2. Intracranial atherosclerosis with high-grade and progressive right M2 segment stenosis compared to 2015. Electronically Signed   By: Monte Fantasia M.D.   On: 05/14/2015 20:46     Chiara Coltrin Cletis Media, MD 05/15/2015, 6:05 PM PGY-1, [place] Intern pager: [phone], text pages welcome

## 2015-05-15 NOTE — Progress Notes (Signed)
Inpatient Rehabilitation  PT is recommending IP Rehab.  Note pt. Is admitted under observation status but has findings of positive CVA.  For stat MRI due to increased weakness of L UE/LE.  Recommend IP Rehab consult pending medical stability.  Please order if you are agreeable.    [place] Admissions Coordinator Cell [phone] Office [phone]

## 2015-05-15 NOTE — Progress Notes (Signed)
VASCULAR LAB PRELIMINARY  PRELIMINARY  PRELIMINARY  PRELIMINARY  Carotid duplex completed.    Preliminary report:  1-39% ICA plaquing.  Vertebral artery flow is antegrade.   Jaymond Waage, RVT 05/15/2015, 10:28 AM

## 2015-05-15 NOTE — Progress Notes (Signed)
Occupational Therapy Evaluation Patient Details Name: Billy Torres MRN: [id number] DOB: [birth year] Today's Date: 05/15/2015    History of Present Illness Billy Torres is an 60 y.o. male with history of left MCA infarct in the past but symptoms had resolved. HE is supposed to be on Plavix but he has not been on that for one month l after he noted left sided weakness. MRI was obtained showing a "Small acute nonhemorrhagic infarct posterior right corona radiata extending to the posterior superior aspect of the posterior limb of the right internal capsule." on 5/8 pt reports he L sided weakness is worse. RN aware and contacting primary team   Clinical Impression   PTA, pt was independent with ADLs and mobility. Pt demonstrates ataxic LUE and LLE movement, balance impairments, and cognitive deficits (problem solving and awareness). Pt currently requires min-mod assist for ADLs and min assist for functional transfers. Feel pt is an excellent candidate for CIR and has potential to reach mod I level upon discharge after post-acute stay. Will continue to follow acutely to address OT needs and goals.    Follow Up Recommendations  CIR    Equipment Recommendations  Other (comment) (TBD in next venue)    Recommendations for Other Services       Precautions / Restrictions Precautions Precautions: Fall Precaution Comments: progressive L sided weakness Restrictions Weight Bearing Restrictions: No      Mobility Bed Mobility Overal bed mobility: Needs Assistance Bed Mobility: Supine to Sit     Supine to sit: Min guard     General bed mobility comments: Increased time and effort. HOB slightly elevated, heavy use of bedrail.  Transfers Overall transfer level: Needs assistance Equipment used: 1 person hand held assist Transfers: Sit to/from Stand Sit to Stand: Min assist         General transfer comment: Min assist for balance due to L lateral lean. Verbal cues for safety     Balance Overall balance assessment: Needs assistance Sitting-balance support: Feet supported;No upper extremity supported Sitting balance-Leahy Scale: Fair Sitting balance - Comments: Able to weightshift to R side, but difficulty/LOB with shift to L side   Standing balance support: No upper extremity supported;During functional activity Standing balance-Leahy Scale: Fair Standing balance comment: L lateral lean, assist to maintain balance                            ADL Overall ADL's : Needs assistance/impaired                 Upper Body Dressing : Minimal assistance;Sitting   Lower Body Dressing: Moderate assistance;Sit to/from stand   Toilet Transfer: Minimal assistance;Ambulation;BSC;Cueing for safety   Toileting- Clothing Manipulation and Hygiene: Minimal assistance;Sit to/from stand Toileting - Clothing Manipulation Details (indicate cue type and reason): for sit-stand     Functional mobility during ADLs: Minimal assistance;Cueing for safety General ADL Comments: Pt with L lateral lean and impaired balance during transfers and standing tasks. Educated pt to use LUE as much as possible throughout day with support from R hand - pt verbalized understanding. No family present for OT eval.     Vision Vision Assessment?: Yes Eye Alignment: Within Functional Limits Ocular Range of Motion: Within Functional Limits Alignment/Gaze Preference: Within Defined Limits Tracking/Visual Pursuits: Decreased smoothness of horizontal tracking;Decreased smoothness of vertical tracking Saccades: Decreased speed of saccadic movement Convergence: Impaired (comment) (minimal eye movement observed) Visual Fields: Left visual field deficit (30 degree peripheral  visual loss)   Perception     Praxis      Pertinent Vitals/Pain Pain Assessment: No/denies pain     Hand Dominance Right   Extremity/Trunk Assessment Upper Extremity Assessment Upper Extremity Assessment: LUE  deficits/detail LUE Deficits / Details: STRENGTH: 4/5 overall; ROM: wfl with increased time; SENSATION: light touch intact LUE Coordination: decreased fine motor;decreased gross motor (uanble to achieve thumb opposition)   Lower Extremity Assessment Lower Extremity Assessment: LLE deficits/detail LLE Deficits / Details: STRENGTH: 4/5; ROM: unable to achieve full thumb opposition, others wfl with increased time; SENSATION: light touch intact LLE Coordination: decreased gross motor   Cervical / Trunk Assessment Cervical / Trunk Assessment: Normal   Communication Communication Communication: Expressive difficulties (quiet, slow speech)   Cognition Arousal/Alertness: Awake/alert Behavior During Therapy: WFL for tasks assessed/performed Overall Cognitive Status: Impaired/Different from baseline Area of Impairment: Safety/judgement;Problem solving         Safety/Judgement: Decreased awareness of safety   Problem Solving: Slow processing General Comments: Pt with slow responses and minimal speech output. Requires increased time for processing information and commands, but responded appropriately with increased time.   General Comments       Exercises       Shoulder Instructions      Home Living Family/patient expects to be discharged to:: Private residence Living Arrangements: Children (son) Available Help at Discharge: Family;Available PRN/intermittently Type of Home: House Home Access: Stairs to enter [employer] of Steps: 2 Entrance Stairs-Rails: None Home Layout: One level     Bathroom Shower/Tub: Tub/shower unit Shower/tub characteristics: Architectural technologist: Standard     Home Equipment: None          Prior Functioning/Environment Level of Independence: Independent        Comments: doens't use AD, has liscense but doesn't drive. minimal cooking, only eats crackers until step-daughter or son gets home    OT Diagnosis: Hemiplegia  non-dominant side;Cognitive deficits   OT Problem List: Decreased strength;Decreased range of motion;Decreased activity tolerance;Impaired balance (sitting and/or standing);Decreased coordination;Decreased safety awareness;Decreased cognition;Decreased knowledge of use of DME or AE;Decreased knowledge of precautions;Impaired UE functional use;Pain   OT Treatment/Interventions: Self-care/ADL training;Therapeutic exercise;Energy conservation;DME and/or AE instruction;Balance training;Patient/family education;Cognitive remediation/compensation;Therapeutic activities    OT Goals(Current goals can be found in the care plan section) Acute Rehab OT Goals Patient Stated Goal: get better OT Goal Formulation: With patient Time For Goal Achievement: 05/29/15 Potential to Achieve Goals: Good ADL Goals Pt Will Perform Upper Body Bathing: with set-up;sitting Pt Will Perform Lower Body Bathing: with set-up;sit to/from stand Pt Will Transfer to Toilet: with supervision;ambulating;bedside commode (over toilet) Pt Will Perform Toileting - Clothing Manipulation and hygiene: sitting/lateral leans;sit to/from stand;with supervision Additional ADL Goal #1: Pt will complete 2 bilateral hand coordination tasks with set up assist to increase LUE coordination.  OT Frequency: Min 2X/week   Barriers to D/C: Decreased caregiver support  Son works Mon-Thurs during the day       Co-evaluation              End of Session Equipment Utilized During Treatment: Teacher, adult education Communication: Mobility status  Activity Tolerance: Patient tolerated treatment well Patient left: in chair;with call bell/phone within reach;with chair alarm set   Time: [id number] OT Time Calculation (min): 26 min Charges:  OT General Charges $OT Visit: 1 Procedure OT Evaluation $OT Eval Moderate Complexity: 1 Procedure OT Treatments $Self Care/Home Management : 8-22 mins G-Codes: OT G-codes **NOT FOR INPATIENT CLASS** Functional  Assessment Tool Used: clinical  judgement Functional Limitation: Self care Self Care Current Status [phone]): At least 20 percent but less than 40 percent impaired, limited or restricted Self Care Goal Status [id number]): At least 1 percent but less than 20 percent impaired, limited or restricted  Redmond Baseman, OTR/L Pager: [phone] 05/15/2015, 3:36 PM

## 2015-05-16 DIAGNOSIS — Z8673 Personal history of transient ischemic attack (TIA), and cerebral infarction without residual deficits: Secondary | ICD-10-CM | POA: Insufficient documentation

## 2015-05-16 DIAGNOSIS — I69322 Dysarthria following cerebral infarction: Secondary | ICD-10-CM

## 2015-05-16 DIAGNOSIS — M6289 Other specified disorders of muscle: Secondary | ICD-10-CM

## 2015-05-16 DIAGNOSIS — Z720 Tobacco use: Secondary | ICD-10-CM

## 2015-05-16 DIAGNOSIS — I10 Essential (primary) hypertension: Secondary | ICD-10-CM

## 2015-05-16 DIAGNOSIS — E119 Type 2 diabetes mellitus without complications: Secondary | ICD-10-CM | POA: Insufficient documentation

## 2015-05-16 DIAGNOSIS — I639 Cerebral infarction, unspecified: Principal | ICD-10-CM

## 2015-05-16 DIAGNOSIS — I69393 Ataxia following cerebral infarction: Secondary | ICD-10-CM

## 2015-05-16 DIAGNOSIS — E785 Hyperlipidemia, unspecified: Secondary | ICD-10-CM

## 2015-05-16 LAB — GLUCOSE, CAPILLARY
GLUCOSE-CAPILLARY: 149 mg/dL — AB (ref 65–99)
GLUCOSE-CAPILLARY: 102 mg/dL — AB (ref 65–99)
Glucose-Capillary: 129 mg/dL — ABNORMAL HIGH (ref 65–99)
Glucose-Capillary: 127 mg/dL — ABNORMAL HIGH (ref 65–99)
Glucose-Capillary: 159 mg/dL — ABNORMAL HIGH (ref 65–99)

## 2015-05-16 NOTE — Progress Notes (Signed)
Family Medicine Teaching Service Daily Progress Note Intern Pager: [phone]  Patient name: Billy Torres Medical record number: [id number] Date of birth: Aug 26, 1955 Age: 60 y.o. Gender: male  Primary Care Provider: Sharon Seller, NP Consultants: Neurology  Code Status: Full   Pt Overview and Major Events to Date:  4/7- admitted with stroke  Assessment and Plan: Billy Torres is a 60 y.o. male presenting with weakness and concern for CVA. PMH is significant for HLD, CVA 2015 without residual deficits, DMII.   # CVA- in the setting of not taking his Plavix and ASA.  Left UE weakness improved. Has a hx of CVA with left sided weakness in 2015 that has resolved. CT head negative. MRI positive for small nonhemorrhagic infarct right corona radiata posterior limb internal capsule. Repeat MRI with worsening of this infarct.  MRA with intracranial atherosclerosis with high-grade and progressive right M2 segment stenosis compared to 2015. Carotid doppler with mild stenosis. Echo with EF of 65-70%, without other significant abnormality. TSH normal.  - Appreciate neuro recs: will follow further recs - Permissive HTN for 5-7 days - Continue lipitor.  - Plavix, ASA  - PT: recommended CIR. Consulted - Neuro checks q4 hr.   # HLD: had lipid panel: 216/111/48/146 - Continue lipitor.   # DMII: A1c 7.2 - Moderate SSI.  - Holding metformin, and janumet. Taking janumet sporadically at home.   FEN/GI:  Heart healthy-carb modified  Prophylaxis: SCD's.   Subjective:  No acute event overnight. Says he wasn't able to sleep due to noise and people coming in and out. Says his arm weakness has improved. Denies other neurologic symptoms  Objective: Temp:  [98 F (36.7 C)-98.8 F (37.1 C)] 98 F (36.7 C) (05/09 0534) Pulse Rate:  [66-76] 72 (05/09 0534) Resp:  [16-18] 18 (05/09 0534) BP: (121-131)/(70-75) 124/72 mmHg (05/09 0534) SpO2:  [97 %-100 %] 100 % (05/09 0534) Physical  Exam: Billy: lying in bed, appears well, NAD Oropharynx: clear, moist CVS: regular rate and rythm, normal s1 and s2 RESP: no increased work of breathing GI: normal bowel sound, soft, non-tender,non-distended NEURO: alert and oriented x4, motor 4/5 in LUE, 5/5 else where, speech clear, CN II-XII intact, patellar and biceps reflexes 2+, sensation intact in all dermatome including his LUE. PSYCH: good affect  Laboratory:  Recent Labs Lab 05/12/15 0950 05/14/15 1325 05/14/15 1350  WBC 8.2 8.6  --   HGB 15.3 15.6 18.7*  HCT 47.6 48.7 55.0*  PLT 255 212  --     Recent Labs Lab 05/12/15 0950 05/14/15 1325 05/14/15 1350  NA 140 141 142  K 4.6 4.0 3.9  CL 103 106 107  CO2 25 21*  --   BUN 15 12 15   CREATININE 1.00 0.99 0.90  CALCIUM 9.4 9.4  --   PROT 7.4 8.1  --   BILITOT 0.6 0.7  --   ALKPHOS 57 60  --   ALT 16 19  --   AST 16 18  --   GLUCOSE 168* 154* 150*    TSH normal  Imaging/Diagnostic Tests: Mr Brain Wo Contrast  05/15/2015  CLINICAL DATA:  New onset altered mental status/ confusion. Increased left-sided weakness/ numbness. EXAM: MRI HEAD WITHOUT CONTRAST TECHNIQUE: Multiplanar, multiecho pulse sequences of the brain and surrounding structures were obtained without intravenous contrast. COMPARISON:  05/14/2015 FINDINGS: Acute right lateral lenticulostriate territory infarct has minimally enlarged in the interim and extends from the posterior corona radiata to the posterior lentiform nucleus. No acute infarct  is identified elsewhere. Chronic infarcts are again seen in the corona radiata and basal ganglia bilaterally, with associated chronic blood products better demonstrated on the prior 3T study. There is no evidence of acute intracranial hemorrhage, mass, midline shift, or extra-axial fluid collection. Mild generalized cerebral atrophy is noted. Patchy T2 hyperintensities throughout the cerebral white matter bilaterally are unchanged and compatible with moderate chronic  small vessel ischemic disease. Orbits are unremarkable. A small amount of fluid is again seen in the right maxillary sinus with increased, mild mucosal thickening. Mucosal thickening is mild in the right frontal and left sphenoid sinuses and moderate in the right ethmoid air cells, slightly increased. Mastoid air cells are clear. Major intracranial vascular flow voids are preserved. IMPRESSION: 1. Minimal enlargement of acute right corona radiata/ basal ganglia region infarct. 2. Moderate chronic small vessel ischemic changes as above. Electronically Signed   By: Logan Bores M.D.   On: 05/15/2015 10:16    Mercy Riding, MD 05/16/2015, 8:23 AM PGY-1, [place] Intern pager: [phone], text pages welcome

## 2015-05-16 NOTE — Progress Notes (Signed)
STROKE TEAM PROGRESS NOTE   HISTORY OF PRESENT ILLNESS Billy Torres is an 60 y.o. male with history of left MCA infarct in the past but symptoms had resolved. He is supposed to be on Plavix but he has not been on that for one month l after he noted left sided weakness. MRI was obtained showing a "Small acute nonhemorrhagic infarct posterior right corona radiata extending to the posterior superior aspect of the posterior limb of the right internal capsule." He was noted to have cleared but this AM at 0100 he felt increased numbness. Charting by nurses noted no change. Morning nurse came on shift and noted a change since yesterday as far as weakness. BP has remained stable. HE is currently going for a STAT MRI brain. He was last known well 5.6.2017, time unknown. Patient was not administered IV t-PA secondary to being outside the window. He was admitted for further evaluation and treatment.   SUBJECTIVE (INTERVAL HISTORY) His therapist is at the bedside.  Overall he feels his condition is stable.  He still has L hemiparesis, but he thinks it has been improved. He had stroke 3 years ago as per pt with speech difficulty and no weakness or numbness at that time. No residue left.    OBJECTIVE Temp:  [98 F (36.7 C)-98.8 F (37.1 C)] 98 F (36.7 C) (05/09 0534) Pulse Rate:  [66-76] 72 (05/09 0534) Cardiac Rhythm:  [-] Normal sinus rhythm (05/08 1900) Resp:  [16-18] 18 (05/09 0534) BP: (121-131)/(70-75) 124/72 mmHg (05/09 0534) SpO2:  [97 %-100 %] 100 % (05/09 0534)  CBC:   Recent Labs Lab 05/12/15 0950 05/14/15 1325 05/14/15 1350  WBC 8.2 8.6  --   NEUTROABS 5412 6.4  --   HGB 15.3 15.6 18.7*  HCT 47.6 48.7 55.0*  MCV 83.7 83.7  --   PLT 255 212  --     Basic Metabolic Panel:   Recent Labs Lab 05/12/15 0950 05/14/15 1325 05/14/15 1350  NA 140 141 142  K 4.6 4.0 3.9  CL 103 106 107  CO2 25 21*  --   GLUCOSE 168* 154* 150*  BUN 15 12 15   CREATININE 1.00 0.99 0.90   CALCIUM 9.4 9.4  --     Lipid Panel:     Component Value Date/Time   CHOL 216* 05/12/2015 0950   TRIG 111 05/12/2015 0950   HDL 48 05/12/2015 0950   CHOLHDL 4.5 05/12/2015 0950   VLDL 22 05/12/2015 0950   LDLCALC 146* 05/12/2015 0950   HgbA1c:  Lab Results  Component Value Date   HGBA1C 7.2* 05/12/2015   Urine Drug Screen:     Component Value Date/Time   LABOPIA NONE DETECTED 06/28/2013 0521   COCAINSCRNUR NONE DETECTED 06/28/2013 0521   LABBENZ NONE DETECTED 06/28/2013 0521   AMPHETMU NONE DETECTED 06/28/2013 0521   THCU NONE DETECTED 06/28/2013 0521   LABBARB NONE DETECTED 06/28/2013 0521      IMAGING  Dg Chest 2 View 05/14/2015   No active cardiopulmonary disease.   Ct Head Wo Contrast 05/14/2015   Atrophy with small vessel chronic ischemic changes of deep cerebral white matter. Old lacunar infarcts RIGHT periventricular white matter and anterior LEFT basal ganglia/internal capsule.   Mr Brain Wo Contrast 05/15/2015   1. Minimal enlargement of acute right corona radiata/ basal ganglia region infarct. 2. Moderate chronic small vessel ischemic changes as above.   Mr Brain Wo Contrast 05/14/2015  Small acute nonhemorrhagic infarct posterior right corona radiata extending  to the posterior superior aspect of the posterior limb of the right internal capsule. Remote infarcts and chronic microvascular changes as noted above. Global atrophy. Paranasal sinus mucosal thickening.   Mr Jodene Nam Head/brain Wo Cm 05/14/2015   1. No acute arterial finding. 2. Intracranial atherosclerosis with high-grade and progressive right M2 segment stenosis compared to 2015.   Carotid Doppler   There is 1-39% bilateral ICA stenosis. Vertebral artery flow is antegrade.    2D Echocardiogram  - Left ventricle: The cavity size was normal. Wall thickness was increased in a pattern of mild LVH. Systolic function was vigorous. The estimated ejection fraction was in the range of 65% to 70%. Wall motion was  normal; there were no regional wall motion abnormalities. Codominant mitral inflow - indeterminate diastolic function. - Left atrium: The atrium was normal in size. - Inferior vena cava: The vessel was normal in size. The respirophasic diameter changes were in the normal range (>= 50%), consistent with normal central venous pressure. Impressions:   LVEF 65-70%, mild LVH, normal wall motion, indeterminate diastolic function, normal LA size, normal IVC.   PHYSICAL EXAM  Temp:  [98 F (36.7 C)-98.3 F (36.8 C)] 98.2 F (36.8 C) (05/09 1422) Pulse Rate:  [66-72] 66 (05/09 1422) Resp:  [18-20] 20 (05/09 1422) BP: (124-131)/(70-75) 124/70 mmHg (05/09 1422) SpO2:  [99 %-100 %] 99 % (05/09 1422)  General - Well nourished, well developed, in no apparent distress.  Ophthalmologic - Fundi not visualized due to noncooperation.  Cardiovascular - Regular rate and rhythm.  Mental Status -  Level of arousal and orientation to time, place, and person were intact. Language including expression, naming, repetition, comprehension was assessed and found intact. Fund of Knowledge was assessed and was intact.  Cranial Nerves II - XII - II - Visual field intact OU. III, IV, VI - Extraocular movements intact. V - Facial sensation intact bilaterally. VII - Facial movement intact bilaterally. VIII - Hearing & vestibular intact bilaterally. X - Palate elevates symmetrically. XI - Chin turning & shoulder shrug intact bilaterally. XII - Tongue protrusion intact.  Motor Strength - The patient's strength was LUE 4/5 proximal and 3/5 distal with left hand dexterity difficulty, LLE 4/5. RUE and RLE 5/5.  Bulk was normal and fasciculations were absent.   Motor Tone - Muscle tone was assessed at the neck and appendages and was normal.  Reflexes - The patient's reflexes were 1+ in all extremities and he had no pathological reflexes.  Sensory - Light touch, temperature/pinprick were assessed and were  symmetrical.    Coordination - The patient had normal movements in the hands and feet with no ataxia or dysmetria.  Tremor was absent.  Gait and Station - deferred to PT in room.   ASSESSMENT/PLAN Mr. ZADQUIEL TEE is a 61 y.o. male with history of HLD, stroke 2015 without residual deficits, type II diabetes, and smoking presenting with L sided weakness . He did not receive IV t-PA due to being outside of the window.   Stroke:  Non-dominant right corona radiata/PLIC infarct secondary to small vessel disease source  Resultant  L hemiparesis  MRI  right corona radiata/PLIC infarct, old b/l BG/CR lacunar infarcts  MRA b/l MCA mild stenosis  Carotid Doppler  No source of embolus   2D Echo  EF 65-70%, No source of embolus   LDL 146  HgbA1c 7.2  SCDs for VTE prophylaxis Diet heart healthy/carb modified Room service appropriate?: Yes; Fluid consistency:: Thin  aspirin 81 mg daily  and clopidogrel 75 mg daily prior to admission, now on aspirin 81 mg daily and clopidogrel 75 mg daily. Continue DAPT on discharge.  Patient counseled to be compliant with his antithrombotic medications  Ongoing aggressive stroke risk factor management  Therapy recommendations:  CIR  Disposition:  Return home with wife (lives at home with family PTA)  Hyperlipidemia  Home meds:  lipitor 40 and mevacor 20, resumed in hospital - now on lipitor 80 mg daily  LDL 146, goal < 70  Continue statin at discharge  Diabetes type II  HgbA1c 7.2, goal < 7.0  Uncontrolled  Tobacco abuse  Current smoker  Smoking cessation counseling provided  Pt is willing to quit  Other Stroke Risk Factors  Cigarette smoker, advised to stop smoking  Hx stroke/TIA  06/2013 left internal capsule and centrum semiovale infarct due to small vessel disease   Hospital day # [place] for Pager information 05/16/2015 11:37 AM   I, the attending vascular neurologist, have  personally obtained a history, examined the patient, evaluated laboratory data, individually viewed imaging studies and agree with radiology interpretations. I obtained additional history from pt at bedside. Together with the NP/PA, we formulated the assessment and plan of care which reflects our mutual decision.  I have made any additions or clarifications directly to the above note and agree with the findings and plan as currently documented.   60 yo M with multiple stroke risk factors including hx of lacunar strokes, HTN, HLD, DM admitted for right PLIC and CR infarcts. MRA b/l MCA mild stenosis, concerning for large vs. Small vessel disease. CUS and TTE unremarkable. LDL 146 and A1C 7.2. Recommend continue DAPT and high dose lipitor. Continue PT/OT. Smoking cessation counseling provided.  Neurology will sign off. Please call with questions. Pt will follow up with Cecille Rubin NP at [facility] in about 2 months. Thanks for the consult.  Rosalin Hawking, MD PhD Stroke Neurology 05/16/2015 6:13 PM       To contact Stroke Continuity provider, please refer to [email or web address] After hours, contact General Neurology

## 2015-05-16 NOTE — Progress Notes (Signed)
Physical Therapy Treatment Patient Details Name: Billy Torres MRN: [id number] DOB: 02/28/1955 Today's Date: 05/16/2015    History of Present Illness Billy Torres is an 60 y.o. male with history of left MCA infarct in the past but symptoms had resolved. HE is supposed to be on Plavix but he has not been on that for one month l after he noted left sided weakness. MRI was obtained showing a "Small acute nonhemorrhagic infarct posterior right corona radiata extending to the posterior superior aspect of the posterior limb of the right internal capsule."     PT Comments    Patient progressing with ambulation L side weakness and balance.  Still with L lateral tendency making R LE progression difficult, but responding well to cues and facilitation.  Feel continued skilled interdisciplinary therapy at CIR level rehab may allow progress to mod I for mobility to allow return home with intermittent assist/supervision.   Follow Up Recommendations  CIR     Equipment Recommendations  Other (comment) (TBA)    Recommendations for Other Services       Precautions / Restrictions Precautions Precautions: Fall    Mobility  Bed Mobility Overal bed mobility: Needs Assistance       Supine to sit: Supervision     General bed mobility comments: increased time, relies heavily on bed rail  Transfers Overall transfer level: Needs assistance Equipment used: Rolling walker (2 wheeled) Transfers: Sit to/from Stand Sit to Stand: Min assist         General transfer comment: assist for safety, cues for hand placement  Ambulation/Gait Ambulation/Gait assistance: Min assist;Mod assist Ambulation Distance (Feet): 120 Feet Assistive device: Rolling walker (2 wheeled) Gait Pattern/deviations: Step-to pattern;Decreased step length - right;Decreased weight shift to left;Shuffle;Narrow base of support     General Gait Details: cues and facilitation for R weight shift for improved L foot  clearance and step length.  assist with walker esp on turns due to L hand weakness difficulty gripping walker   Stairs            Wheelchair Mobility    Modified Rankin (Stroke Patients Only) Modified Rankin (Stroke Patients Only) Pre-Morbid Rankin Score: No significant disability Modified Rankin: Severe disability     Balance Overall balance assessment: Needs assistance   Sitting balance-Leahy Scale: Fair Sitting balance - Comments: performed seated lateral weight shifts with facilitation through ribs and cues for less head and shoulder movement     Standing balance-Leahy Scale: Poor Standing balance comment: UE support and assist for balance in static standing               High Level Balance Comments: in hallway standing L side to rail holding rail performed lateral L/R weight shifts; gait with wall rail and manual facilitation for L weight shift with cues for increased R step length about 30' and backwards walking 10' with min A with wall rail and cues for weight shift    Cognition Arousal/Alertness: Awake/alert Behavior During Therapy: WFL for tasks assessed/performed Overall Cognitive Status: Impaired/Different from baseline Area of Impairment: Safety/judgement;Problem solving         Safety/Judgement: Decreased awareness of safety   Problem Solving: Slow processing      Exercises      General Comments        Pertinent Vitals/Pain Pain Assessment: No/denies pain    Home Living       Type of Home: House  Prior Function            PT Goals (current goals can now be found in the care plan section) Progress towards PT goals: Progressing toward goals    Frequency  Min 4X/week    PT Plan Current plan remains appropriate    Co-evaluation             End of Session Equipment Utilized During Treatment: Gait belt Activity Tolerance: Patient tolerated treatment well Patient left: in chair;with call bell/phone within  reach;with chair alarm set     Time: 1005-1035 PT Time Calculation (min) (ACUTE ONLY): 30 min  Charges:  $Gait Training: 8-22 mins $Neuromuscular Re-education: 8-22 mins                    G Codes:      Reginia Naas 05-25-15, 12:19 PM  Magda Kiel, [place] 2015/05/25

## 2015-05-16 NOTE — Evaluation (Signed)
Speech Language Pathology Evaluation Patient Details Name: Billy Torres MRN: [id number] DOB: 04-Dec-1955 Today's Date: 05/16/2015 Time: [id number] SLP Time Calculation (min) (ACUTE ONLY): 21 min  Problem List:  Patient Active Problem List   Diagnosis Date Noted  . Diabetes mellitus type 2 in nonobese (HCC)   . Essential hypertension   . History of CVA (cerebrovascular accident) without residual deficits   . Ataxia, post-stroke   . Dysarthria, post-stroke   . Tobacco abuse   . Acute CVA (cerebrovascular accident) ([place]) 05/15/2015  . Left-sided weakness 05/14/2015  . Right shoulder pain 03/03/2014  . History of CVA (cerebrovascular accident) 02/28/2014  . Smoking 06/29/2013  . CVA (cerebral infarction) 06/29/2013  . Dyslipidemia 06/29/2013  . TIA (transient ischemic attack) 06/27/2013  . Diabetes ([place]) 06/27/2013   Past Medical History:  Past Medical History  Diagnosis Date  . Diabetes mellitus without complication ([place])   . Hypertension   . Hyperlipidemia    Past Surgical History:  Past Surgical History  Procedure Laterality Date  . Neck surgery     HPI:  Billy Torres is an 60 y.o. male with history of left MCA infarct (symptoms resolved), TIA, tobacco abuse. MRI was obtained showing a "Small acute nonhemorrhagic infarct posterior right corona radiata extending to the posterior superior aspect of the posterior limb of the right internal capsule." on 5/8 pt reports he L sided weakness is worse.    Assessment / Plan / Recommendation Clinical Impression  Pt administered the Cognistat standardized assessment and scored in the mild-moderately impaired range for attention, memory and calculations. Pt has poor self awareness of impairments and insight into cognitive abilities. Pt's verbalization skills are more accurate than his performance in functional activities. He would benefit from skilled ST at the inpatient rehab level.     SLP Assessment  Patient needs continued  Speech Lanaguage Pathology Services    Follow Up Recommendations  Inpatient Rehab    Frequency and Duration min 2x/week  2 weeks      SLP Evaluation Prior Functioning  Cognitive/Linguistic Baseline: Information not available Type of Home: House  Lives With: Son Vocation: Unemployed   Cognition  Overall Cognitive Status: Impaired/Different from baseline Arousal/Alertness: Awake/alert Orientation Level: Oriented X4 Attention: Sustained Sustained Attention: Impaired Sustained Attention Impairment: Verbal basic Memory: Impaired Memory Impairment: Storage deficit;Retrieval deficit;Decreased recall of new information;Decreased short term memory Decreased Short Term Memory: Verbal basic Awareness: Impaired Awareness Impairment: Emergent impairment;Anticipatory impairment;Intellectual impairment Problem Solving: Impaired Problem Solving Impairment: Functional basic Executive Function: Self Monitoring;Self Correcting Self Monitoring: Appears intact Self Correcting: Appears intact Safety/Judgment: Impaired    Comprehension  Auditory Comprehension Overall Auditory Comprehension: Appears within functional limits for tasks assessed Conversation: Simple Visual Recognition/Discrimination Discrimination: Not tested Reading Comprehension Reading Status: Not tested    Expression Expression Primary Mode of Expression: Verbal Verbal Expression Overall Verbal Expression: Appears within functional limits for tasks assessed Initiation: No impairment Level of Generative/Spontaneous Verbalization: Conversation Pragmatics: No impairment Written Expression Dominant Hand: Right Written Expression: Not tested   Oral / Motor  Motor Speech Overall Motor Speech: Appears within functional limits for tasks assessed Respiration: Within functional limits Phonation: Normal Resonance: Within functional limits Articulation: Within functional limitis Intelligibility: Intelligible (slightly  distorted intermittently, however functional) Motor Planning: Witnin functional limits   GO                    Houston Siren 05/16/2015, 12:42 PM  Orbie Pyo Shellby Schlink M.Ed [employer] [phone]

## 2015-05-16 NOTE — Discharge Summary (Signed)
[facility] Discharge Summary  Patient name: Billy Torres Medical record number: [id number] Date of birth: 10-26-1955 Age: 60 y.o. Gender: male Date of Admission: 05/14/2015  Date of Discharge: 05/17/2015  Admitting Physician: Dickie La, MD  Primary Care Provider: Sharon Seller, NP Consultants: neurology  Indication for Hospitalization: CVA  Discharge Diagnoses/Problem List:  CVA DM-2 HTN HLD Tobacco use disorder  Disposition: CIR  Discharge Condition: improved and medically stable  Discharge Exam: Filed Vitals:   05/16/15 2128 05/17/15 0124 05/17/15 0600 05/17/15 1024  BP: 113/73 118/79 120/69 131/86  Pulse: 79 78 68 89  Temp: 98.1 F (36.7 C) 97.5 F (36.4 C) 98 F (36.7 C) 98.2 F (36.8 C)  TempSrc: Oral Oral Oral Oral  Resp: 18 18 18 20   Height:      Weight:      SpO2: 100% 100% 100% 98%   GEN: lying in bed, appears well, NAD Oropharynx: clear, moist CVS: regular rate and rythm, normal s1 and s2 RESP: no increased work of breathing GI: normal bowel sound, soft, non-tender,non-distended NEURO: alert and oriented x4, motor 4/5 in LUE, 5/5 else where, speech clear, CN II-XII intact, patellar and biceps reflexes 2+, sensation intact in all dermatome including his LUE. PSYCH: good affect  Brief Hospital Course:  Billy Torres is a 60 y.o. male with history of HLD, CVA 2015 without residual deficits, DMII who presents with left sided weakness and found to have ischemic stroke on MRI.  CVA- improved. Presented with LUE & LLE weakness. LUE 4/5 and LLE resolved at time of discharge. Has a hx of CVA with left sided weakness in 2015 that has resolved. Supposedly on Plavix and ASA but hasn't been taking. He was outside tPA window on arrival. CT head negative. MRI positive for small nonhemorrhagic infarct right corona radiata posterior limb internal capsule. Repeat MRI with worsening of this infarct. MRA with intracranial  atherosclerosis with high-grade and progressive right M2 segment stenosis compared to 2015. Carotid doppler with mild stenosis. Echo with EF of 65-70%, without other significant abnormality. TSH normal. He was started on ASA 325 mg, Plavix 75 mg  and Lipitor 80 mg. Lisinopril was held for permissive hypertension. Patient was evaluated by PT, who  recommended CIR. Accordingly, CIR evaluated the patient and admitted him for rehabilitation. SLP also evaluated patient and recommended continued SLP services at least twice a week.   Patient to continue dual antiplatelet agents and Lipitor 80 mg. He can resume his lisinopril after 05/21/2015 for blood pressure.   HLD: had lipid panel on 05/12/2015. We continued Lipitor  DMII: A1c 7.2 on 05/12/2015. Received moderate SSI. On Janumet as outpatient, which was held and continued at discharge. He was taking janumet sporadically at home per note from outpatient visit. He says he is no longer on metformin. Janumet was resumed on discharge.   Issues for Follow Up:  1. CVA: assess for improvement and compliance with his medication. Advice to quit smoking.  Significant Procedures: CT, MRI, MRA, Carotid doppler, Echo  Significant Labs and Imaging:   Recent Labs Lab 05/12/15 0950 05/14/15 1325 05/14/15 1350  WBC 8.2 8.6  --   HGB 15.3 15.6 18.7*  HCT 47.6 48.7 55.0*  PLT 255 212  --     Recent Labs Lab 05/12/15 0950 05/14/15 1325 05/14/15 1350  NA 140 141 142  K 4.6 4.0 3.9  CL 103 106 107  CO2 25 21*  --   GLUCOSE 168* 154* 150*  BUN 15 12 15   CREATININE 1.00 0.99 0.90  CALCIUM 9.4 9.4  --   ALKPHOS 57 60  --   AST 16 18  --   ALT 16 19  --   ALBUMIN 4.3 3.8  --     Results/Tests Pending at Time of Discharge: none  Discharge Medications:    Medication List    STOP taking these medications        lovastatin 20 MG tablet  Commonly known as:  MEVACOR     metFORMIN 500 MG tablet  Commonly known as:  GLUCOPHAGE      TAKE these  medications        aspirin 325 MG EC tablet  Take 1 tablet (325 mg total) by mouth daily.     atorvastatin 80 MG tablet  Commonly known as:  LIPITOR  Take 1 tablet (80 mg total) by mouth daily.     CENTRUM ADULTS PO  Take by mouth.     clopidogrel 75 MG tablet  Commonly known as:  PLAVIX  Take 1 tablet (75 mg total) by mouth daily with breakfast.     glucose blood test strip  Commonly known as:  TRUE METRIX BLOOD GLUCOSE TEST  Use as instructed     L-Arginine 500 MG Caps  Take 1 capsule by mouth daily.     lisinopril 10 MG tablet  Commonly known as:  PRINIVIL,ZESTRIL  Take 1 tablet (10 mg total) by mouth daily. Start after 05/21/2015.     senna-docusate 8.6-50 MG tablet  Commonly known as:  Senokot-S  Take 1 tablet by mouth at bedtime as needed for mild constipation.     sitaGLIPtin-metformin 50-1000 MG tablet  Commonly known as:  JANUMET  Take 1 tablet by mouth 2 (two) times daily with a meal.     TRUE METRIX AIR GLUCOSE METER Devi  1 Device by Does not apply route 2 (two) times daily.        Discharge Instructions: Please refer to Patient Instructions section of EMR for full details.  Patient was counseled important signs and symptoms that should prompt return to medical care, changes in medications, dietary instructions, activity restrictions, and follow up appointments.   Follow-Up Appointments: Follow-up Information    Follow up with Dennie Bible, NP. Schedule an appointment as soon as possible for a visit in 2 months.   Specialty:  Family Medicine   Why:  stroke clinic   Contact information:   [address] [place] [address] [phone]       Mercy Riding, MD 05/17/2015, 11:40 AM PGY-1, [place]

## 2015-05-16 NOTE — Consult Note (Signed)
Physical Medicine and Rehabilitation Consult   Reason for Consult: Left sided weakness Referring Physician:  Dr. Nori Riis    HPI: Billy Torres is a 60 y.o. RH-male with history of T2DM, HTN, L-MCA infarct 2015 without residual deficits who had been off plavix/ASA X 1 month. He was admitted on 05/14/15 with mild left sided weakness X 2 days with multiple falls MRI/MRA brain showed small acute non-hemorrhagic infarct posterior right corona radiata to posterior limb of internal capsule, global atropy and remote/chronic small vessel disease. 2D echo with EF 65-70% without abnormality. Carotid dopplers without ICA stenosis. He did have worsening of symptoms with confusion after admission and follow up MRI showed minimal enlargement.  ASA and plavix resumed and Dr. Leonie Man felt that stroke was due to small vessel disease.  Patient with resultant left sided weakness with ataxia, problems with processing with cognitive deficits and balance deficits.   Therapy evaluation done yesterday and CIR recommended for follow up therapy.    Review of Systems  HENT: Negative for hearing loss.   Eyes: Negative for blurred vision and double vision.  Respiratory: Negative for cough and shortness of breath.   Cardiovascular: Negative for chest pain and palpitations.  Gastrointestinal: Negative for nausea, vomiting and constipation.  Genitourinary: Negative for dysuria and urgency.  Musculoskeletal: Negative for myalgias, back pain and joint pain.  Skin: Negative for itching.  Neurological: Positive for sensory change and focal weakness. Negative for dizziness and headaches.  Psychiatric/Behavioral: The patient does not have insomnia.   All other systems reviewed and are negative.     Past Medical History  Diagnosis Date  . Diabetes mellitus without complication ([place])   . Hypertension   . Hyperlipidemia     Past Surgical History  Procedure Laterality Date  . Neck surgery      Family History    Problem Relation Age of Onset  . Hypertension Mother   . Hypertension Father     Social History:  Lives with family--son manages home and does most of cooking. Used to work in [employer] till last year. Independent PTA-- but sedentary PTA.   reports that he has been smoking Cigarettes--2-3 daily?.   He does not have any smokeless tobacco history on file. He reports that he does not drink alcohol or use illicit drugs.    Allergies: No Known Allergies    Medications Prior to Admission  Medication Sig Dispense Refill  . L-Arginine 500 MG CAPS Take 1 capsule by mouth daily.     . Multiple Vitamins-Minerals (CENTRUM ADULTS PO) Take by mouth.    Marland Kitchen aspirin EC 81 MG tablet Take 1 tablet (81 mg total) by mouth daily. (Patient not taking: Reported on 05/12/2015) 30 tablet 2  . atorvastatin (LIPITOR) 40 MG tablet Take 1 tablet (40 mg total) by mouth daily. (Patient not taking: Reported on 05/12/2015) 30 tablet 2  . Blood Glucose Monitoring Suppl (TRUE METRIX AIR GLUCOSE METER) DEVI 1 Device by Does not apply route 2 (two) times daily. 1 Device 0  . clopidogrel (PLAVIX) 75 MG tablet Take 1 tablet (75 mg total) by mouth daily with breakfast. (Patient not taking: Reported on 02/28/2014) 30 tablet 0  . glucose blood (TRUE METRIX BLOOD GLUCOSE TEST) test strip Use as instructed 100 each 12  . lisinopril (PRINIVIL,ZESTRIL) 10 MG tablet Take 1 tablet (10 mg total) by mouth daily. 90 tablet 1  . lovastatin (MEVACOR) 20 MG tablet Take 1 tablet (20 mg total) by mouth at  bedtime. 90 tablet 1  . metFORMIN (GLUCOPHAGE) 500 MG tablet Take 1 tablet (500 mg total) by mouth 2 (two) times daily with a meal. 60 tablet 0  . sitaGLIPtin-metformin (JANUMET) 50-1000 MG tablet Take 1 tablet by mouth 2 (two) times daily with a meal. 180 tablet 1    Home: Home Living Family/patient expects to be discharged to:: Private residence Living Arrangements: Children (son) Available Help at Discharge: Family, Available  PRN/intermittently Type of Home: House Home Access: Stairs to enter Technical brewer of Steps: 2 Entrance Stairs-Rails: None Home Layout: One level Bathroom Shower/Tub: Chiropodist: Standard Home Equipment: None  Functional History: Prior Function Level of Independence: Independent Comments: doens't use AD, has liscense but doesn't drive. minimal cooking, only eats crackers until step-daughter or son gets home Functional Status:  Mobility: Bed Mobility Overal bed mobility: Needs Assistance Bed Mobility: Supine to Sit Supine to sit: Min guard General bed mobility comments: Increased time and effort. HOB slightly elevated, heavy use of bedrail. Transfers Overall transfer level: Needs assistance Equipment used: 1 person hand held assist Transfers: Sit to/from Stand Sit to Stand: Min assist General transfer comment: Min assist for balance due to L lateral lean. Verbal cues for safety Ambulation/Gait Ambulation/Gait assistance: Mod assist, Max assist Ambulation Distance (Feet): 20 Feet Assistive device: 1 person hand held assist, 2 person hand held assist Gait Pattern/deviations: Step-to pattern, Decreased step length - left, Decreased stance time - left, Decreased stride length, Ataxic, Staggering left, Narrow base of support General Gait Details: pt with poor L LE sequencing during gait, decerased step height, strong L lateral lean requiring maxA to maintain mindline Gait velocity: slow Gait velocity interpretation: Below normal speed for age/gender    ADL: ADL Overall ADL's : Needs assistance/impaired Upper Body Dressing : Minimal assistance, Sitting Lower Body Dressing: Moderate assistance, Sit to/from stand Toilet Transfer: Minimal assistance, Ambulation, BSC, Cueing for safety Toileting- Clothing Manipulation and Hygiene: Minimal assistance, Sit to/from stand Toileting - Clothing Manipulation Details (indicate cue type and reason): for  sit-stand Functional mobility during ADLs: Minimal assistance, Cueing for safety General ADL Comments: Pt with L lateral lean and impaired balance during transfers and standing tasks. Educated pt to use LUE as much as possible throughout day with support from R hand - pt verbalized understanding. No family present for OT eval.  Cognition: Cognition Overall Cognitive Status: Impaired/Different from baseline Orientation Level: Oriented X4 Cognition Arousal/Alertness: Awake/alert Behavior During Therapy: WFL for tasks assessed/performed Overall Cognitive Status: Impaired/Different from baseline Area of Impairment: Safety/judgement, Problem solving Safety/Judgement: Decreased awareness of safety Problem Solving: Slow processing General Comments: Pt with slow responses and minimal speech output. Requires increased time for processing information and commands, but responded appropriately with increased time.   Blood pressure 124/72, pulse 72, temperature 98 F (36.7 C), temperature source Oral, resp. rate 18, height 5\' 8"  (1.727 m), weight 67.586 kg (149 lb), SpO2 100 %. Physical Exam  Vitals reviewed. Constitutional: He is oriented to person, place, and time. He appears well-developed and well-nourished.  HENT:  Head: Normocephalic and atraumatic.  Mouth/Throat: Oropharynx is clear and moist.  Eyes: Conjunctivae and EOM are normal. Pupils are equal, round, and reactive to light.  Neck: Normal range of motion. Neck supple.  Cardiovascular: Normal rate and regular rhythm.   No murmur heard. Respiratory: Effort normal and breath sounds normal. No stridor. He has no wheezes.  GI: Soft. Bowel sounds are normal. He exhibits no distension. There is no tenderness.  Musculoskeletal: He exhibits  no edema or tenderness.  Neurological: He is alert and oriented to person, place, and time. He displays normal reflexes.  Mild dysarthria with mild left facial weakness.  Able to follow simple motor  commands without difficulty. Mild diminished sensation along left side, but significantly improved/almost resolved Motor: Left upper extremity: 4/5 proximal to distal Left lower cervical and 4+/5 proximal to distal Right upper/right lower extremity 5/5 proximal to distal  Skin: Skin is warm and dry.  Psychiatric: His affect is blunt. He is slowed. He is attentive.    Results for orders placed or performed during the hospital encounter of 05/14/15 (from the past 24 hour(s))  Urinalysis, dipstick only     Status: Abnormal   Collection Time: 05/15/15  9:23 AM  Result Value Ref Range   Color, Urine YELLOW YELLOW   APPearance CLOUDY (A) CLEAR   Specific Gravity, Urine 1.022 1.005 - 1.030   pH 5.5 5.0 - 8.0   Glucose, UA NEGATIVE NEGATIVE mg/dL   Hgb urine dipstick NEGATIVE NEGATIVE   Bilirubin Urine NEGATIVE NEGATIVE   Ketones, ur NEGATIVE NEGATIVE mg/dL   Protein, ur NEGATIVE NEGATIVE mg/dL   Nitrite NEGATIVE NEGATIVE   Leukocytes, UA NEGATIVE NEGATIVE  Glucose, capillary     Status: Abnormal   Collection Time: 05/15/15 11:29 AM  Result Value Ref Range   Glucose-Capillary 102 (H) 65 - 99 mg/dL   Comment 1 Notify RN    Comment 2 Document in Chart   Glucose, capillary     Status: Abnormal   Collection Time: 05/15/15  4:22 PM  Result Value Ref Range   Glucose-Capillary 162 (H) 65 - 99 mg/dL   Comment 1 Notify RN    Comment 2 Document in Chart   Glucose, capillary     Status: Abnormal   Collection Time: 05/15/15  9:15 PM  Result Value Ref Range   Glucose-Capillary 109 (H) 65 - 99 mg/dL   Comment 1 Notify RN    Comment 2 Document in Chart   Glucose, capillary     Status: Abnormal   Collection Time: 05/16/15  1:24 AM  Result Value Ref Range   Glucose-Capillary 129 (H) 65 - 99 mg/dL   Comment 1 Notify RN    Comment 2 Document in Chart   Glucose, capillary     Status: Abnormal   Collection Time: 05/16/15  5:33 AM  Result Value Ref Range   Glucose-Capillary 127 (H) 65 - 99 mg/dL    Comment 1 Notify RN    Comment 2 Document in Chart    Dg Chest 2 View  05/14/2015  CLINICAL DATA:  61 year old male with acute shortness of breath. EXAM: CHEST  2 VIEW COMPARISON:  10/13/2007 chest radiograph FINDINGS: Upper limits normal heart size again noted as well as small peribronchial thickening. There is no evidence of focal airspace disease, pulmonary edema, suspicious pulmonary nodule/mass, pleural effusion, or pneumothorax. No acute bony abnormalities are identified. IMPRESSION: No active cardiopulmonary disease. Electronically Signed   By: Margarette Canada M.D.   On: 05/14/2015 21:59   Ct Head Wo Contrast  05/14/2015  CLINICAL DATA:  LEFT side weakness which has persisted since Saturday, history hypertension, diabetes mellitus, smoker EXAM: CT HEAD WITHOUT CONTRAST TECHNIQUE: Contiguous axial images were obtained from the base of the skull through the vertex without intravenous contrast. COMPARISON:  06/27/2013 FINDINGS: Generalized atrophy. Normal ventricular morphology. No midline shift or mass effect. Old lacunar infarct anterior LEFT basal ganglia and anterior limb of LEFT internal capsule. Old  RIGHT periventricular white matter infarct and frontal region. Small vessel chronic ischemic changes of deep cerebral white matter. No intracranial hemorrhage, mass lesion or evidence acute infarction. No extra-axial fluid collections. Tiny air-fluid level RIGHT maxillary sinus with minimal mucous in LEFT sphenoid sinus. Bones, mastoid air cells, and remaining sinuses unremarkable. IMPRESSION: Atrophy with small vessel chronic ischemic changes of deep cerebral white matter. Old lacunar infarcts RIGHT periventricular white matter and anterior LEFT basal ganglia/internal capsule. Electronically Signed   By: Lavonia Dana M.D.   On: 05/14/2015 14:54   Mr Brain Wo Contrast  05/15/2015  CLINICAL DATA:  New onset altered mental status/ confusion. Increased left-sided weakness/ numbness. EXAM: MRI HEAD WITHOUT  CONTRAST TECHNIQUE: Multiplanar, multiecho pulse sequences of the brain and surrounding structures were obtained without intravenous contrast. COMPARISON:  05/14/2015 FINDINGS: Acute right lateral lenticulostriate territory infarct has minimally enlarged in the interim and extends from the posterior corona radiata to the posterior lentiform nucleus. No acute infarct is identified elsewhere. Chronic infarcts are again seen in the corona radiata and basal ganglia bilaterally, with associated chronic blood products better demonstrated on the prior 3T study. There is no evidence of acute intracranial hemorrhage, mass, midline shift, or extra-axial fluid collection. Mild generalized cerebral atrophy is noted. Patchy T2 hyperintensities throughout the cerebral white matter bilaterally are unchanged and compatible with moderate chronic small vessel ischemic disease. Orbits are unremarkable. A small amount of fluid is again seen in the right maxillary sinus with increased, mild mucosal thickening. Mucosal thickening is mild in the right frontal and left sphenoid sinuses and moderate in the right ethmoid air cells, slightly increased. Mastoid air cells are clear. Major intracranial vascular flow voids are preserved. IMPRESSION: 1. Minimal enlargement of acute right corona radiata/ basal ganglia region infarct. 2. Moderate chronic small vessel ischemic changes as above. Electronically Signed   By: Logan Bores M.D.   On: 05/15/2015 10:16   Mr Brain Wo Contrast  05/14/2015  CLINICAL DATA:  60 year old diabetic hypertensive male fell in shower. Left-sided weakness started yesterday. Subsequent encounter. EXAM: MRI HEAD WITHOUT CONTRAST TECHNIQUE: Multiplanar, multiecho pulse sequences of the brain and surrounding structures were obtained without intravenous contrast. COMPARISON:  05/14/2015 CT.  06/28/2013 MR. FINDINGS: Small acute nonhemorrhagic infarct posterior right corona radiata extending to the posterior superior  aspect of the posterior limb of the right internal capsule. Remote left lenticular nucleus/ caudate infarct with blood-stained encephalomalacia. Remote mid right corona radiata infarct with blood-stained encephalomalacia. No other areas of intracranial hemorrhage. Moderate to marked chronic microvascular changes. Mild global atrophy without hydrocephalus. No intracranial mass lesion noted on this unenhanced exam. Major intracranial vascular structures are patent. Paranasal sinus mucosal thickening. Nasal turbinate mucosal thickening. Minimal exophthalmos otherwise orbital structures unremarkable. Mild spinal stenosis C3-4 with prominent left facet joint degenerative changes. Minimal sclerosis C2 and C3 vertebral body without significant change. Cervical medullary junction unremarkable. IMPRESSION: Small acute nonhemorrhagic infarct posterior right corona radiata extending to the posterior superior aspect of the posterior limb of the right internal capsule. Remote infarcts and chronic microvascular changes as noted above. Global atrophy. Paranasal sinus mucosal thickening. Electronically Signed   By: Genia Del M.D.   On: 05/14/2015 16:54   Mr Jodene Nam Head/brain Wo Cm  05/14/2015  CLINICAL DATA:  Acute infarct. EXAM: MRA HEAD WITHOUT CONTRAST TECHNIQUE: Angiographic images of the Circle of Willis were obtained using MRA technique without intravenous contrast. COMPARISON:  06/28/2013 FINDINGS: Symmetric carotid and vertebral arteries. Symmetric and normal pattern vertebrobasilar branching. No communicating  branches are noted. No proximal occlusion. No flow limiting stenosis in the carotid, vertebral, or basilar arteries. Shallow outpouching from the right supraclinoid ICA projecting inferiorly is usually unresolved infundibulum given shape. There is atherosclerotic type irregularity of bilateral MCA branches with high-grade and progressive stenosis of inferior division right M2 branch. Proximal high-grade narrowing  of the first branch left MCA is stable from prior. No notable posterior circulation stenosis. IMPRESSION: 1. No acute arterial finding. 2. Intracranial atherosclerosis with high-grade and progressive right M2 segment stenosis compared to 2015. Electronically Signed   By: Monte Fantasia M.D.   On: 05/14/2015 20:46    Assessment/Plan: Diagnosis: small acute non-hemorrhagic infarct posterior right corona radiata to posterior limb of internal capsule Labs and images independently reviewed.  Records reviewed and summated above. Stroke: Continue secondary stroke prophylaxis and Risk Factor Modification listed below:   Antiplatelet therapy:   Blood Pressure Management:  Continue current medication with prn's with permisive HTN per primary team Statin Agent:   Diabetes management:   Tobacco abuse:  Counsel Left sided hemiparesis Motor recovery: Fluoxetine  1. Does the need for close, 24 hr/day medical supervision in concert with the patient's rehab needs make it unreasonable for this patient to be served in a less intensive setting? Yes  2. Co-Morbidities requiring supervision/potential complications: DM type 2(Monitor in accordance with exercise and adjust meds as necessary), HTN (monitor and provide prns in accordance with increased physical exertion and pain), L-MCA infarct 2015 without residual deficits, ataxia, cognitive deficits, balance deficits, tobacco abuse (counsel) 3. Due to safety, disease management, medication administration and patient education, does the patient require 24 hr/day rehab nursing? Yes 4. Does the patient require coordinated care of a physician, rehab nurse, PT (1-2 hrs/day, 5 days/week), OT (1-2 hrs/day, 5 days/week) and SLP (1-2 hrs/day, 5 days/week) to address physical and functional deficits in the context of the above medical diagnosis(es)? Yes Addressing deficits in the following areas: balance, endurance, locomotion, strength, transferring, bathing, toileting,  cognition, speech and psychosocial support 5. Can the patient actively participate in an intensive therapy program of at least 3 hrs of therapy per day at least 5 days per week? Yes 6. The potential for patient to make measurable gains while on inpatient rehab is excellent 7. Anticipated functional outcomes upon discharge from inpatient rehab are modified independent and supervision  with PT, modified independent and supervision with OT, independent and modified independent with SLP. 8. Estimated rehab length of stay to reach the above functional goals is: 8-13 days. 9. Does the patient have adequate social supports and living environment to accommodate these discharge functional goals? Potentially 10. Anticipated D/C setting: Home 11. Anticipated post D/C treatments: HH therapy and Home excercise program 12. Overall Rehab/Functional Prognosis: good  RECOMMENDATIONS: This patient's condition is appropriate for continued rehabilitative care in the following setting: CIR Patient has agreed to participate in recommended program. Yes Note that insurance prior authorization may be required for reimbursement for recommended care.  Comment: Rehab Admissions Coordinator to follow up.  Delice Lesch, MD 05/16/2015

## 2015-05-17 ENCOUNTER — Inpatient Hospital Stay (HOSPITAL_COMMUNITY)
Admission: RE | Admit: 2015-05-17 | Discharge: 2015-05-31 | DRG: 057 | Disposition: A | Discharge status: Skilled Nursing Facility | Payer: Medicaid Other | Source: Intra-hospital | Attending: Physical Medicine & Rehabilitation | Admitting: Physical Medicine & Rehabilitation

## 2015-05-17 DIAGNOSIS — M62838 Other muscle spasm: Secondary | ICD-10-CM | POA: Diagnosis not present

## 2015-05-17 DIAGNOSIS — I69319 Unspecified symptoms and signs involving cognitive functions following cerebral infarction: Secondary | ICD-10-CM

## 2015-05-17 DIAGNOSIS — D72828 Other elevated white blood cell count: Secondary | ICD-10-CM | POA: Diagnosis present

## 2015-05-17 DIAGNOSIS — I69993 Ataxia following unspecified cerebrovascular disease: Secondary | ICD-10-CM

## 2015-05-17 DIAGNOSIS — I639 Cerebral infarction, unspecified: Secondary | ICD-10-CM | POA: Diagnosis present

## 2015-05-17 DIAGNOSIS — E119 Type 2 diabetes mellitus without complications: Secondary | ICD-10-CM | POA: Diagnosis present

## 2015-05-17 DIAGNOSIS — E785 Hyperlipidemia, unspecified: Secondary | ICD-10-CM | POA: Diagnosis present

## 2015-05-17 DIAGNOSIS — F0150 Vascular dementia without behavioral disturbance: Secondary | ICD-10-CM | POA: Diagnosis present

## 2015-05-17 DIAGNOSIS — I10 Essential (primary) hypertension: Secondary | ICD-10-CM | POA: Diagnosis present

## 2015-05-17 DIAGNOSIS — R269 Unspecified abnormalities of gait and mobility: Secondary | ICD-10-CM | POA: Diagnosis present

## 2015-05-17 DIAGNOSIS — I69398 Other sequelae of cerebral infarction: Secondary | ICD-10-CM

## 2015-05-17 DIAGNOSIS — F0390 Unspecified dementia without behavioral disturbance: Secondary | ICD-10-CM | POA: Diagnosis present

## 2015-05-17 DIAGNOSIS — R296 Repeated falls: Secondary | ICD-10-CM | POA: Diagnosis present

## 2015-05-17 DIAGNOSIS — I69354 Hemiplegia and hemiparesis following cerebral infarction affecting left non-dominant side: Secondary | ICD-10-CM | POA: Diagnosis not present

## 2015-05-17 DIAGNOSIS — R4189 Other symptoms and signs involving cognitive functions and awareness: Secondary | ICD-10-CM | POA: Diagnosis present

## 2015-05-17 DIAGNOSIS — I739 Peripheral vascular disease, unspecified: Secondary | ICD-10-CM | POA: Diagnosis present

## 2015-05-17 DIAGNOSIS — D72829 Elevated white blood cell count, unspecified: Secondary | ICD-10-CM

## 2015-05-17 DIAGNOSIS — G8190 Hemiplegia, unspecified affecting unspecified side: Secondary | ICD-10-CM

## 2015-05-17 DIAGNOSIS — I6330 Cerebral infarction due to thrombosis of unspecified cerebral artery: Secondary | ICD-10-CM

## 2015-05-17 DIAGNOSIS — I69322 Dysarthria following cerebral infarction: Secondary | ICD-10-CM

## 2015-05-17 DIAGNOSIS — E088 Diabetes mellitus due to underlying condition with unspecified complications: Secondary | ICD-10-CM

## 2015-05-17 LAB — GLUCOSE, CAPILLARY
GLUCOSE-CAPILLARY: 157 mg/dL — AB (ref 65–99)
Glucose-Capillary: 119 mg/dL — ABNORMAL HIGH (ref 65–99)
Glucose-Capillary: 94 mg/dL (ref 65–99)
Glucose-Capillary: 219 mg/dL — ABNORMAL HIGH (ref 65–99)

## 2015-05-17 MED ORDER — INSULIN ASPART 100 UNIT/ML ~~LOC~~ SOLN
0.0000 [IU] | Freq: Three times a day (TID) | SUBCUTANEOUS | Status: DC
  Administered 2015-05-18 – 2015-05-25 (×12): 2 [IU] via SUBCUTANEOUS
  Administered 2015-05-26: 3 [IU] via SUBCUTANEOUS
  Administered 2015-05-27 – 2015-05-31 (×6): 2 [IU] via SUBCUTANEOUS

## 2015-05-17 MED ORDER — LISINOPRIL 10 MG PO TABS: 10.0000 mg | ORAL_TABLET | Freq: Every day | ORAL | Status: DC

## 2015-05-17 MED ORDER — ALUM & MAG HYDROXIDE-SIMETH 200-200-20 MG/5ML PO SUSP
30.0000 mL | ORAL | Status: DC | PRN
  Filled 2015-05-17: qty 30

## 2015-05-17 MED ORDER — ASPIRIN 325 MG PO TBEC: 325.0000 mg | DELAYED_RELEASE_TABLET | Freq: Every day | ORAL | Status: DC

## 2015-05-17 MED ORDER — ACETAMINOPHEN 325 MG PO TABS
650.0000 mg | ORAL_TABLET | Freq: Four times a day (QID) | ORAL | Status: DC | PRN
  Administered 2015-05-17 – 2015-05-29 (×8): 650 mg via ORAL
  Filled 2015-05-17 (×6): qty 2

## 2015-05-17 MED ORDER — ASPIRIN EC 81 MG PO TBEC
81.0000 mg | DELAYED_RELEASE_TABLET | Freq: Every day | ORAL | Status: DC
  Administered 2015-05-18 – 2015-05-31 (×14): 81 mg via ORAL
  Filled 2015-05-17 (×14): qty 1

## 2015-05-17 MED ORDER — SENNOSIDES-DOCUSATE SODIUM 8.6-50 MG PO TABS: 1.0000 | ORAL_TABLET | Freq: Every evening | ORAL | Status: DC | PRN

## 2015-05-17 MED ORDER — PROCHLORPERAZINE 25 MG RE SUPP: 12.5000 mg | Freq: Four times a day (QID) | RECTAL | Status: DC | PRN

## 2015-05-17 MED ORDER — ATORVASTATIN CALCIUM 80 MG PO TABS: 80.0000 mg | ORAL_TABLET | Freq: Every day | ORAL | Status: DC

## 2015-05-17 MED ORDER — FLEET ENEMA 7-19 GM/118ML RE ENEM: 1.0000 | ENEMA | Freq: Once | RECTAL | Status: DC | PRN

## 2015-05-17 MED ORDER — PROCHLORPERAZINE MALEATE 5 MG PO TABS
5.0000 mg | ORAL_TABLET | Freq: Four times a day (QID) | ORAL | Status: DC | PRN
  Administered 2015-05-22 – 2015-05-31 (×4): 10 mg via ORAL
  Filled 2015-05-17 (×4): qty 2

## 2015-05-17 MED ORDER — ACETAMINOPHEN 325 MG PO TABS
325.0000 mg | ORAL_TABLET | ORAL | Status: DC | PRN
  Administered 2015-05-21 – 2015-05-25 (×3): 650 mg via ORAL
  Filled 2015-05-17 (×5): qty 2

## 2015-05-17 MED ORDER — TRAZODONE HCL 50 MG PO TABS: 25.0000 mg | ORAL_TABLET | Freq: Every evening | ORAL | Status: DC | PRN

## 2015-05-17 MED ORDER — GUAIFENESIN-DM 100-10 MG/5ML PO SYRP: 5.0000 mL | ORAL_SOLUTION | Freq: Four times a day (QID) | ORAL | Status: DC | PRN

## 2015-05-17 MED ORDER — CLOPIDOGREL BISULFATE 75 MG PO TABS
75.0000 mg | ORAL_TABLET | Freq: Every day | ORAL | Status: DC
  Administered 2015-05-18 – 2015-05-31 (×14): 75 mg via ORAL
  Filled 2015-05-17 (×13): qty 1

## 2015-05-17 MED ORDER — ATORVASTATIN CALCIUM 80 MG PO TABS
80.0000 mg | ORAL_TABLET | Freq: Every day | ORAL | Status: DC
  Administered 2015-05-18 – 2015-05-31 (×14): 80 mg via ORAL
  Filled 2015-05-17 (×15): qty 1

## 2015-05-17 MED ORDER — LINAGLIPTIN 5 MG PO TABS
5.0000 mg | ORAL_TABLET | Freq: Every day | ORAL | Status: DC
  Administered 2015-05-17 – 2015-05-31 (×15): 5 mg via ORAL
  Filled 2015-05-17 (×17): qty 1

## 2015-05-17 MED ORDER — BISACODYL 5 MG PO TBEC
5.0000 mg | DELAYED_RELEASE_TABLET | Freq: Every day | ORAL | Status: DC | PRN
  Administered 2015-05-24: 5 mg via ORAL
  Filled 2015-05-17: qty 1

## 2015-05-17 MED ORDER — PROCHLORPERAZINE EDISYLATE 5 MG/ML IJ SOLN
5.0000 mg | Freq: Four times a day (QID) | INTRAMUSCULAR | Status: DC | PRN
  Administered 2015-05-19: 5 mg via INTRAMUSCULAR
  Filled 2015-05-17 (×2): qty 2

## 2015-05-17 MED ORDER — ENOXAPARIN SODIUM 40 MG/0.4ML ~~LOC~~ SOLN
40.0000 mg | SUBCUTANEOUS | Status: DC
  Administered 2015-05-18 – 2015-05-31 (×14): 40 mg via SUBCUTANEOUS
  Filled 2015-05-17 (×14): qty 0.4

## 2015-05-17 MED ORDER — METFORMIN HCL 500 MG PO TABS
500.0000 mg | ORAL_TABLET | Freq: Two times a day (BID) | ORAL | Status: DC
  Administered 2015-05-17 – 2015-05-31 (×28): 500 mg via ORAL
  Filled 2015-05-17 (×27): qty 1

## 2015-05-17 MED ORDER — DIPHENHYDRAMINE HCL 12.5 MG/5ML PO ELIX: 12.5000 mg | ORAL_SOLUTION | Freq: Four times a day (QID) | ORAL | Status: DC | PRN

## 2015-05-17 NOTE — Progress Notes (Signed)
Rehab admissions - I met with patient and his ex-wife.  Patient would like inpatient rehab admission.  He has no insurance.  He lives with his 61 yo son who works.  Bed available and will admit to acute inpatient rehab today.  Call me for questions.  #[phone]

## 2015-05-17 NOTE — H&P (View-Only) (Signed)
Physical Medicine and Rehabilitation Admission H&P    Chief Complaint  Patient presents with  . Left sided weakness and cognitive deficits.     HPI:   Billy Torres is a 60 y.o. RH-male with history of T2DM, HTN, L-MCA infarct 2015 without residual deficits who had been off plavix/ASA X 1 month. He was admitted on 05/14/15 with mild left sided weakness X 2 days with multiple falls MRI/MRA brain showed small acute non-hemorrhagic infarct posterior right corona radiata to posterior limb of internal capsule, global atropy and remote/chronic small vessel disease. 2D echo with EF 65-70% without abnormality. Carotid dopplers without ICA stenosis. He did have worsening of symptoms with confusion after admission and follow up MRI showed minimal enlargement. Dr. Leonie Man recommended resuming ASA 81 mg and plavix for stroke felt to be due to small vessel disease. Patient with resultant left sided weakness with ataxia, problems with processing with cognitive deficits and balance deficits. Therapy evaluation done yesterday and CIR recommended for follow up therapy.    Review of Systems  HENT: Positive for hearing loss.   Eyes: Negative for blurred vision and double vision.  Respiratory: Negative for cough, sputum production and wheezing.   Cardiovascular: Negative for chest pain and palpitations.  Gastrointestinal: Negative for heartburn, nausea, abdominal pain and constipation.  Genitourinary: Negative for dysuria and urgency.  Musculoskeletal: Negative for myalgias, back pain and joint pain.  Neurological: Positive for focal weakness. Negative for dizziness, tingling, speech change (denies speech change) and headaches.  Psychiatric/Behavioral: Negative for depression.  All other systems reviewed and are negative.   Past Medical History  Diagnosis Date  . Diabetes mellitus without complication ([place])   . Hypertension   . Hyperlipidemia     Past Surgical History  Procedure Laterality  Date  . Neck surgery      Family History  Problem Relation Age of Onset  . Hypertension Mother   . Hypertension Father    Social History: Lives with family--son manages home and does most of cooking. Used to work in [employer] till last year. Independent PTA-- but sedentary PTA. reports that he has been smoking Cigarettes--2-3 daily?. He does not have any smokeless tobacco history on file. He reports that he does not drink alcohol or use illicit drugs.   Allergies: No Known Allergies   Medications Prior to Admission  Medication Sig Dispense Refill  . L-Arginine 500 MG CAPS Take 1 capsule by mouth daily.     . Multiple Vitamins-Minerals (CENTRUM ADULTS PO) Take by mouth.    Marland Kitchen aspirin EC 81 MG tablet Take 1 tablet (81 mg total) by mouth daily. (Patient not taking: Reported on 05/12/2015) 30 tablet 2  . atorvastatin (LIPITOR) 40 MG tablet Take 1 tablet (40 mg total) by mouth daily. (Patient not taking: Reported on 05/12/2015) 30 tablet 2  . Blood Glucose Monitoring Suppl (TRUE METRIX AIR GLUCOSE METER) DEVI 1 Device by Does not apply route 2 (two) times daily. 1 Device 0  . clopidogrel (PLAVIX) 75 MG tablet Take 1 tablet (75 mg total) by mouth daily with breakfast. (Patient not taking: Reported on 02/28/2014) 30 tablet 0  . glucose blood (TRUE METRIX BLOOD GLUCOSE TEST) test strip Use as instructed 100 each 12  . lovastatin (MEVACOR) 20 MG tablet Take 1 tablet (20 mg total) by mouth at bedtime. 90 tablet 1  . metFORMIN (GLUCOPHAGE) 500 MG tablet Take 1 tablet (500 mg total) by mouth 2 (two) times daily with a meal. 60 tablet 0  .  sitaGLIPtin-metformin (JANUMET) 50-1000 MG tablet Take 1 tablet by mouth 2 (two) times daily with a meal. 180 tablet 1  . [DISCONTINUED] lisinopril (PRINIVIL,ZESTRIL) 10 MG tablet Take 1 tablet (10 mg total) by mouth daily. 90 tablet 1    Home: Home Living Family/patient expects to be discharged to:: Private residence Living Arrangements: Children  (son) Available Help at Discharge: Family, Available PRN/intermittently Type of Home: House Home Access: Stairs to enter Technical brewer of Steps: 2 Entrance Stairs-Rails: None Home Layout: One level Bathroom Shower/Tub: Chiropodist: Standard Home Equipment: None  Lives With: Son   Functional History: Prior Function Level of Independence: Independent Comments: doens't use AD, has liscense but doesn't drive. minimal cooking, only eats crackers until step-daughter or son gets home  Functional Status:  Mobility: Bed Mobility Overal bed mobility: Needs Assistance Bed Mobility: Supine to Sit Supine to sit: Supervision General bed mobility comments: increased time, relies heavily on bed rail Transfers Overall transfer level: Needs assistance Equipment used: Rolling walker (2 wheeled) Transfers: Sit to/from Stand Sit to Stand: Min assist General transfer comment: assist for safety, cues for hand placement Ambulation/Gait Ambulation/Gait assistance: Min assist, Mod assist Ambulation Distance (Feet): 120 Feet Assistive device: Rolling walker (2 wheeled) Gait Pattern/deviations: Step-to pattern, Decreased step length - right, Decreased weight shift to left, Shuffle, Narrow base of support General Gait Details: cues and facilitation for R weight shift for improved L foot clearance and step length.  assist with walker esp on turns due to L hand weakness difficulty gripping walker Gait velocity: slow Gait velocity interpretation: Below normal speed for age/gender    ADL: ADL Overall ADL's : Needs assistance/impaired Upper Body Dressing : Minimal assistance, Sitting Lower Body Dressing: Moderate assistance, Sit to/from stand Toilet Transfer: Minimal assistance, Ambulation, BSC, Cueing for safety Toileting- Clothing Manipulation and Hygiene: Minimal assistance, Sit to/from stand Toileting - Clothing Manipulation Details (indicate cue type and reason): for  sit-stand Functional mobility during ADLs: Minimal assistance, Cueing for safety General ADL Comments: Pt with L lateral lean and impaired balance during transfers and standing tasks. Educated pt to use LUE as much as possible throughout day with support from R hand - pt verbalized understanding. No family present for OT eval.  Cognition: Cognition Overall Cognitive Status: Impaired/Different from baseline Arousal/Alertness: Awake/alert Orientation Level: Oriented X4 Attention: Sustained Sustained Attention: Impaired Sustained Attention Impairment: Verbal basic Memory: Impaired Memory Impairment: Storage deficit, Retrieval deficit, Decreased recall of new information, Decreased short term memory Decreased Short Term Memory: Verbal basic Awareness: Impaired Awareness Impairment: Emergent impairment, Anticipatory impairment, Intellectual impairment Problem Solving: Impaired Problem Solving Impairment: Functional basic Executive Function: Self Monitoring, Self Correcting Self Monitoring: Appears intact Self Correcting: Appears intact Safety/Judgment: Impaired Cognition Arousal/Alertness: Awake/alert Behavior During Therapy: WFL for tasks assessed/performed Overall Cognitive Status: Impaired/Different from baseline Area of Impairment: Safety/judgement, Problem solving Safety/Judgement: Decreased awareness of safety Problem Solving: Slow processing General Comments: Pt with slow responses and minimal speech output. Requires increased time for processing information and commands, but responded appropriately with increased time.   Blood pressure 131/86, pulse 89, temperature 98.2 F (36.8 C), temperature source Oral, resp. rate 20, height 5\' 8"  (1.727 m), weight 67.586 kg (149 lb), SpO2 98 %. Physical Exam  Constitutional: He is oriented to person, place, and time. He appears well-developed and well-nourished. No distress.  HENT:  Head: Normocephalic and atraumatic.  Mouth/Throat:  Oropharynx is clear and moist.  Eyes: Conjunctivae are normal. Pupils are equal, round, and reactive to light.  Neck:  Normal range of motion. Neck supple.  Respiratory: Effort normal and breath sounds normal. No stridor. No respiratory distress. He has no wheezes.  GI: Soft. Bowel sounds are normal. He exhibits no distension. There is no tenderness.  Musculoskeletal: He exhibits no edema or tenderness.  Neurological: He is alert and oriented to person, place, and time.  Mild dysarthria with mild left facial weakness.  Able to follow simple motor commands without difficulty. Sensation intact to light touch Has poor insight and lacks awareness of deficits Motor: Left upper extremity: 4/5 proximal to distal Left lower extremity and 4+/5 proximal to distal Right upper/right lower extremity 5/5 proximal to distal   Skin: Skin is warm and dry. He is not diaphoretic.  Psychiatric: He has a normal mood and affect. His behavior is normal. Thought content normal.    Results for orders placed or performed during the hospital encounter of 05/14/15 (from the past 48 hour(s))  Glucose, capillary     Status: Abnormal   Collection Time: 05/15/15  4:22 PM  Result Value Ref Range   Glucose-Capillary 162 (H) 65 - 99 mg/dL   Comment 1 Notify RN    Comment 2 Document in Chart   Glucose, capillary     Status: Abnormal   Collection Time: 05/15/15  9:15 PM  Result Value Ref Range   Glucose-Capillary 109 (H) 65 - 99 mg/dL   Comment 1 Notify RN    Comment 2 Document in Chart   Glucose, capillary     Status: Abnormal   Collection Time: 05/16/15  1:24 AM  Result Value Ref Range   Glucose-Capillary 129 (H) 65 - 99 mg/dL   Comment 1 Notify RN    Comment 2 Document in Chart   Glucose, capillary     Status: Abnormal   Collection Time: 05/16/15  5:33 AM  Result Value Ref Range   Glucose-Capillary 127 (H) 65 - 99 mg/dL   Comment 1 Notify RN    Comment 2 Document in Chart   Glucose, capillary     Status:  Abnormal   Collection Time: 05/16/15 11:28 AM  Result Value Ref Range   Glucose-Capillary 149 (H) 65 - 99 mg/dL   Comment 1 Notify RN    Comment 2 Document in Chart   Glucose, capillary     Status: Abnormal   Collection Time: 05/16/15  4:39 PM  Result Value Ref Range   Glucose-Capillary 102 (H) 65 - 99 mg/dL  Glucose, capillary     Status: Abnormal   Collection Time: 05/16/15  9:46 PM  Result Value Ref Range   Glucose-Capillary 159 (H) 65 - 99 mg/dL   Comment 1 Notify RN    Comment 2 Document in Chart   Glucose, capillary     Status: Abnormal   Collection Time: 05/17/15  6:00 AM  Result Value Ref Range   Glucose-Capillary 119 (H) 65 - 99 mg/dL   Comment 1 Notify RN    Comment 2 Document in Chart   Glucose, capillary     Status: Abnormal   Collection Time: 05/17/15 11:52 AM  Result Value Ref Range   Glucose-Capillary 157 (H) 65 - 99 mg/dL   No results found.    Medical Problem List and Plan: 1.  Left sided weakness, gait abnormalities secondary to right corona radiata infarct. 2.  DVT Prophylaxis/Anticoagulation: Pharmaceutical: Lovenox 3. Pain Management: N/A 4. Mood: LCSW to follow for evaluation and support.  5. Neuropsych: This patient is not fully capable of making decisions  on his own behalf. 6. Skin/Wound Care: Routine pressure relief measures. Maintain adequate nutritional and hydration status 7. Fluids/Electrolytes/Nutrition: Monitor I/O. Check lytes in am. 8. T2DM: Monitor BS ac/hs. Resume metformin 500 mg bid and trajenta 5 mg daily.  9. Dyslipidemia: on Lipitor daily.  10. HTN: Monitor BP bid --lisinopril recently added --hold to allow for adequate perfusion. Resume lisinopril if BP trends upwards.   Post Admission Physician Evaluation: 1. Functional deficits secondary  to right corona radiata infarct. 2. Patient is admitted to receive collaborative, interdisciplinary care between the physiatrist, rehab nursing staff, and therapy team. 3. Patient's level of  medical complexity and substantial therapy needs in context of that medical necessity cannot be provided at a lesser intensity of care such as a SNF. 4. Patient has experienced substantial functional loss from his/her baseline which was documented above under the "Functional History" and "Functional Status" headings.  Judging by the patient's diagnosis, physical exam, and functional history, the patient has potential for functional progress which will result in measurable gains while on inpatient rehab.  These gains will be of substantial and practical use upon discharge  in facilitating mobility and self-care at the household level. 5. Physiatrist will provide 24 hour management of medical needs as well as oversight of the therapy plan/treatment and provide guidance as appropriate regarding the interaction of the two. 6. 24 hour rehab nursing will assist with safety, disease management, medication administration and patient education and help integrate therapy concepts, techniques,education, etc. 7. PT will assess and treat for/with: Lower extremity strength, range of motion, stamina, balance, functional mobility, safety, adaptive techniques and equipment, coping skills, pain control, stroke education.   Goals are: Mod I/Supervision. 8. OT will assess and treat for/with: ADL's, functional mobility, safety, upper extremity strength, adaptive techniques and equipment, ego support, and community reintegration.   Goals are: Mod I/Supervision. Therapy may proceed with showering this patient. 9. SLP will assess and treat for/with: cognition.  Goals are: Mod I/Supervision. 10. Case Management and Social Worker will assess and treat for psychological issues and discharge planning. 11. Team conference will be held weekly to assess progress toward goals and to determine barriers to discharge. 12. Patient will receive at least 3 hours of therapy per day at least 5 days per week. 13. ELOS: 8-12 days.        14. Prognosis:  good  Delice Lesch, MD 05/17/2015

## 2015-05-17 NOTE — PMR Pre-admission (Signed)
PMR Admission Coordinator Pre-Admission Assessment  Patient: Billy Torres is an 60 y.o., male MRN: [id number] DOB: [birth year] Height: 5\' 8"  (172.7 cm) Weight: 67.586 kg (149 lb)              Insurance Information Self pay - no insurance  Medicaid Application Date:        Case Manager:   Disability Application Date:        Case Worker:    Emergency Facilities manager Information    Name Relation Home Work [place] Other   [phone]   Wrigley, Lema [phone]  [phone]     Current Medical History  Patient Admitting Diagnosis: Small acute non-hemorrhagic infarct posterior right corona radiata to posterior limb of internal capsule    History of Present Illness: A 60 y.o. RH-male with history of T2DM, HTN, L-MCA infarct 2015 without residual deficits who had been off plavix/ASA X 1 month. He was admitted on 05/14/15 with mild left sided weakness X 2 days with multiple falls MRI/MRA brain showed small acute non-hemorrhagic infarct posterior right corona radiata to posterior limb of internal capsule, global atropy and remote/chronic small vessel disease. 2D echo with EF 65-70% without abnormality. Carotid dopplers without ICA stenosis. He did have worsening of symptoms with confusion after admission and follow up MRI showed minimal enlargement. ASA and plavix resumed and Dr. Leonie Man felt that stroke was due to small vessel disease. Patient with resultant left sided weakness with ataxia, problems with processing with cognitive deficits and balance deficits. Therapy evaluation done yesterday and CIR recommended for follow up therapy.     Total: 2=NIH  Past Medical History  Past Medical History  Diagnosis Date  . Diabetes mellitus without complication ([place])   . Hypertension   . Hyperlipidemia     Family History  family history includes Hypertension in his father and mother.  Prior Rehab/Hospitalizations: No previous rehab   Has the patient  had major surgery during 100 days prior to admission? No  Current Medications   Current facility-administered medications:  .   stroke: mapping our early stages of recovery book, , Does not apply, Once, Caleb G Melancon, MD .  0.9 %  sodium chloride infusion, 250 mL, Intravenous, PRN, York Ram Melancon, MD .  acetaminophen (TYLENOL) tablet 650 mg, 650 mg, Oral, Q6H PRN, Asiyah Cletis Media, MD, 650 mg at 05/15/15 2155 .  aspirin EC tablet 325 mg, 325 mg, Oral, Daily, Rosalin Hawking, MD, 325 mg at 05/17/15 1009 .  atorvastatin (LIPITOR) tablet 80 mg, 80 mg, Oral, Daily, Rosalin Hawking, MD, 80 mg at 05/17/15 1009 .  clopidogrel (PLAVIX) tablet 75 mg, 75 mg, Oral, Q breakfast, Aquilla Hacker, MD, 75 mg at 05/17/15 0805 .  insulin aspart (novoLOG) injection 0-15 Units, 0-15 Units, Subcutaneous, TID WC, Aquilla Hacker, MD, 2 Units at 05/16/15 1159 .  senna-docusate (Senokot-S) tablet 1 tablet, 1 tablet, Oral, QHS PRN, York Ram Melancon, MD .  sodium chloride flush (NS) 0.9 % injection 3 mL, 3 mL, Intravenous, Q12H, Aquilla Hacker, MD, 3 mL at 05/16/15 0943 .  sodium chloride flush (NS) 0.9 % injection 3 mL, 3 mL, Intravenous, PRN, Aquilla Hacker, MD  Patients Current Diet: Diet heart healthy/carb modified Room service appropriate?: Yes; Fluid consistency:: Thin Diet - low sodium heart healthy  Precautions / Restrictions Precautions Precautions: Fall Precaution Comments: progressive L sided weakness Restrictions Weight Bearing Restrictions: No   Has the patient had 2 or more falls or  a fall with injury in the past year?Yes.  Patient reports he fell in the bathtub this past Sunday and may have fallen Saturday as well, but no injury.  Prior Activity Level Limited Community (1-2x/wk): Went out 2-3 X a week.  Not working, did not drive.  Home Assistive Devices / Equipment Home Assistive Devices/Equipment: None Home Equipment: None  Prior Device Use: Indicate devices/aids used by the patient  prior to current illness, exacerbation or injury? None  Prior Functional Level Prior Function Level of Independence: Independent Comments: doens't use AD, has liscense but doesn't drive. minimal cooking, only eats crackers until step-daughter or son gets home  Self Care: Did the patient need help bathing, dressing, using the toilet or eating?  Independent  Indoor Mobility: Did the patient need assistance with walking from room to room (with or without device)? Independent  Stairs: Did the patient need assistance with internal or external stairs (with or without device)? Independent  Functional Cognition: Did the patient need help planning regular tasks such as shopping or remembering to take medications? Independent  Current Functional Level Cognition  Arousal/Alertness: Awake/alert Overall Cognitive Status: Impaired/Different from baseline Orientation Level: Oriented X4 Safety/Judgement: Decreased awareness of safety General Comments: Pt with slow responses and minimal speech output. Requires increased time for processing information and commands, but responded appropriately with increased time. Attention: Sustained Sustained Attention: Impaired Sustained Attention Impairment: Verbal basic Memory: Impaired Memory Impairment: Storage deficit, Retrieval deficit, Decreased recall of new information, Decreased short term memory Decreased Short Term Memory: Verbal basic Awareness: Impaired Awareness Impairment: Emergent impairment, Anticipatory impairment, Intellectual impairment Problem Solving: Impaired Problem Solving Impairment: Functional basic Executive Function: Self Monitoring, Self Correcting Self Monitoring: Appears intact Self Correcting: Appears intact Safety/Judgment: Impaired    Extremity Assessment (includes Sensation/Coordination)  Upper Extremity Assessment: LUE deficits/detail LUE Deficits / Details: STRENGTH: 4/5 overall; ROM: wfl with increased time;  SENSATION: light touch intact LUE Coordination: decreased fine motor, decreased gross motor (uanble to achieve thumb opposition)  Lower Extremity Assessment: LLE deficits/detail LLE Deficits / Details: STRENGTH: 4/5; ROM: unable to achieve full thumb opposition, others wfl with increased time; SENSATION: light touch intact LLE Coordination: decreased gross motor    ADLs  Overall ADL's : Needs assistance/impaired Upper Body Dressing : Minimal assistance, Sitting Lower Body Dressing: Moderate assistance, Sit to/from stand Toilet Transfer: Minimal assistance, Ambulation, BSC, Cueing for safety Toileting- Clothing Manipulation and Hygiene: Minimal assistance, Sit to/from stand Toileting - Clothing Manipulation Details (indicate cue type and reason): for sit-stand Functional mobility during ADLs: Minimal assistance, Cueing for safety General ADL Comments: Pt with L lateral lean and impaired balance during transfers and standing tasks. Educated pt to use LUE as much as possible throughout day with support from R hand - pt verbalized understanding. No family present for OT eval.    Mobility  Overal bed mobility: Needs Assistance Bed Mobility: Supine to Sit Supine to sit: Supervision General bed mobility comments: increased time, relies heavily on bed rail    Transfers  Overall transfer level: Needs assistance Equipment used: Rolling walker (2 wheeled) Transfers: Sit to/from Stand Sit to Stand: Min assist General transfer comment: assist for safety, cues for hand placement    Ambulation / Gait / Stairs / Wheelchair Mobility  Ambulation/Gait Ambulation/Gait assistance: Min assist, Mod assist Ambulation Distance (Feet): 120 Feet Assistive device: Rolling walker (2 wheeled) Gait Pattern/deviations: Step-to pattern, Decreased step length - right, Decreased weight shift to left, Shuffle, Narrow base of support General Gait Details: cues and facilitation  for R weight shift for improved L foot  clearance and step length.  assist with walker esp on turns due to L hand weakness difficulty gripping walker Gait velocity: slow Gait velocity interpretation: Below normal speed for age/gender    Posture / Balance Dynamic Sitting Balance Sitting balance - Comments: performed seated lateral weight shifts with facilitation through ribs and cues for less head and shoulder movement Balance Overall balance assessment: Needs assistance Sitting-balance support: Feet supported, No upper extremity supported Sitting balance-Leahy Scale: Fair Sitting balance - Comments: performed seated lateral weight shifts with facilitation through ribs and cues for less head and shoulder movement Standing balance support: No upper extremity supported, During functional activity Standing balance-Leahy Scale: Poor Standing balance comment: UE support and assist for balance in static standing High Level Balance Comments: in hallway standing L side to rail holding rail performed lateral L/R weight shifts; gait with wall rail and manual facilitation for L weight shift with cues for increased R step length about 30' and backwards walking 10' with min A with wall rail and cues for weight shift    Special needs/care consideration BiPAP/CPAP No CPM No Continuous Drip IV No Dialysis No         Life Vest No Oxygen No Special Bed No Trach Size No Wound Vac (area) No     Skin No                               Bowel mgmt: Last BM 05/15/15 Bladder mgmt: Using urinal Diabetic mgmt Yes, on oral medications at home    Previous Home Environment Living Arrangements: Children (son)  Lives With: Son Available Help at Discharge: Family, Available PRN/intermittently Type of Home: House Home Layout: One level Home Access: Stairs to enter Entrance Stairs-Rails: None Technical brewer of Steps: 2 Bathroom Shower/Tub: Chiropodist: Standard Home Care Services: Yes  Discharge Living Setting Plans for  Discharge Living Setting: Patient's home, House, Lives with (comment) (Lives with son and son's girlfriend.) Type of Home at Discharge: House Discharge Home Layout: One level Discharge Home Access: Stairs to enter Entrance Stairs-Number of Steps: 2 steps Does the patient have any problems obtaining your medications?: No  Social/Family/Support Systems Patient Roles: Parent, Other (Comment) (Has an ex-wife, son and son's girlfriend.) Contact Information: Emrah Corriea - ex-wife - [phone] Anticipated Caregiver: son and son's girlfriend Ability/Limitations of Caregiver: Son works 6 am to 5 pm, his girlfriend gets home between 12 and 2:30 pm. Caregiver Availability: Intermittent Discharge Plan Discussed with Primary Caregiver: Yes Is Caregiver In Agreement with Plan?: Yes Does Caregiver/Family have Issues with Lodging/Transportation while Pt is in Rehab?: No  Goals/Additional Needs Patient/Family Goal for Rehab: PT/OT mod I and supervision, ST mod I and I goals Expected length of stay: 8-13 days Cultural Considerations: None Dietary Needs: Heart healthy, carb mod, thin liquids Equipment Needs: TBD Pt/Family Agrees to Admission and willing to participate: Yes Program Orientation Provided & Reviewed with Pt/Caregiver Including Roles  & Responsibilities: Yes  Decrease burden of Care through IP rehab admission: N/A  Possible need for SNF placement upon discharge: Not planned  Patient Condition: This patient's condition remains as documented in the consult dated 05/16/15, in which the Rehabilitation Physician determined and documented that the patient's condition is appropriate for intensive rehabilitative care in an inpatient rehabilitation facility. Will admit to inpatient rehab today.  Preadmission Screen Completed By:  [name], 05/17/2015 12:30 PM ______________________________________________________________________  Discussed status with Dr. Posey Pronto on 05/17/15 at 49 and  received telephone approval for admission today.  Admission Coordinator:  [name], time1230/Date05/10/17

## 2015-05-17 NOTE — Progress Notes (Signed)
Billy Diones, RN Rehab Admission Coordinator Signed Physical Medicine and Rehabilitation PMR Pre-admission 05/17/2015 12:21 PM  Related encounter: ED to Hosp-Admission (Current) from 05/14/2015 in [place] Collapse All   PMR Admission Coordinator Pre-Admission Assessment  Patient: Billy Torres is an 60 y.o., male MRN: [id number] DOB: 1955/04/09 Height: 5\' 8"  (172.7 cm) Weight: 67.586 kg (149 lb)  Insurance Information Self pay - no insurance  Medicaid Application Date: Case Manager:  Disability Application Date: Case Worker:   Emergency Facilities manager Information    Name Relation Home Work [place] Other   [phone]   Brodric, Stach [phone]  [phone]     Current Medical History  Patient Admitting Diagnosis: Small acute non-hemorrhagic infarct posterior right corona radiata to posterior limb of internal capsule   History of Present Illness: A 60 y.o. RH-male with history of T2DM, HTN, L-MCA infarct 2015 without residual deficits who had been off plavix/ASA X 1 month. He was admitted on 05/14/15 with mild left sided weakness X 2 days with multiple falls MRI/MRA brain showed small acute non-hemorrhagic infarct posterior right corona radiata to posterior limb of internal capsule, global atropy and remote/chronic small vessel disease. 2D echo with EF 65-70% without abnormality. Carotid dopplers without ICA stenosis. He did have worsening of symptoms with confusion after admission and follow up MRI showed minimal enlargement. ASA and plavix resumed and Dr. Leonie Man felt that stroke was due to small vessel disease. Patient with resultant left sided weakness with ataxia, problems with processing with cognitive  deficits and balance deficits. Therapy evaluation done yesterday and CIR recommended for follow up therapy.    Total: 2=NIH  Past Medical History  Past Medical History  Diagnosis Date  . Diabetes mellitus without complication ([place])   . Hypertension   . Hyperlipidemia     Family History  family history includes Hypertension in his father and mother.  Prior Rehab/Hospitalizations: No previous rehab   Has the patient had major surgery during 100 days prior to admission? No  Current Medications   Current facility-administered medications:  . stroke: mapping our early stages of recovery book, , Does not apply, Once, Caleb G Melancon, MD . 0.9 % sodium chloride infusion, 250 mL, Intravenous, PRN, York Ram Melancon, MD . acetaminophen (TYLENOL) tablet 650 mg, 650 mg, Oral, Q6H PRN, Asiyah Cletis Media, MD, 650 mg at 05/15/15 2155 . aspirin EC tablet 325 mg, 325 mg, Oral, Daily, Rosalin Hawking, MD, 325 mg at 05/17/15 1009 . atorvastatin (LIPITOR) tablet 80 mg, 80 mg, Oral, Daily, Rosalin Hawking, MD, 80 mg at 05/17/15 1009 . clopidogrel (PLAVIX) tablet 75 mg, 75 mg, Oral, Q breakfast, Aquilla Hacker, MD, 75 mg at 05/17/15 0805 . insulin aspart (novoLOG) injection 0-15 Units, 0-15 Units, Subcutaneous, TID WC, Aquilla Hacker, MD, 2 Units at 05/16/15 1159 . senna-docusate (Senokot-S) tablet 1 tablet, 1 tablet, Oral, QHS PRN, York Ram Melancon, MD . sodium chloride flush (NS) 0.9 % injection 3 mL, 3 mL, Intravenous, Q12H, Aquilla Hacker, MD, 3 mL at 05/16/15 0943 . sodium chloride flush (NS) 0.9 % injection 3 mL, 3 mL, Intravenous, PRN, Aquilla Hacker, MD  Patients Current Diet: Diet heart healthy/carb modified Room service appropriate?: Yes; Fluid consistency:: Thin Diet - low sodium heart healthy  Precautions / Restrictions Precautions Precautions: Fall Precaution Comments: progressive L sided weakness Restrictions Weight Bearing Restrictions: No   Has the  patient had 2 or  more falls or a fall with injury in the past year?Yes. Patient reports he fell in the bathtub this past Sunday and may have fallen Saturday as well, but no injury.  Prior Activity Level Limited Community (1-2x/wk): Went out 2-3 X a week. Not working, did not drive.  Home Assistive Devices / Equipment Home Assistive Devices/Equipment: None Home Equipment: None  Prior Device Use: Indicate devices/aids used by the patient prior to current illness, exacerbation or injury? None  Prior Functional Level Prior Function Level of Independence: Independent Comments: doens't use AD, has liscense but doesn't drive. minimal cooking, only eats crackers until step-daughter or son gets home  Self Care: Did the patient need help bathing, dressing, using the toilet or eating? Independent  Indoor Mobility: Did the patient need assistance with walking from room to room (with or without device)? Independent  Stairs: Did the patient need assistance with internal or external stairs (with or without device)? Independent  Functional Cognition: Did the patient need help planning regular tasks such as shopping or remembering to take medications? Independent  Current Functional Level Cognition  Arousal/Alertness: Awake/alert Overall Cognitive Status: Impaired/Different from baseline Orientation Level: Oriented X4 Safety/Judgement: Decreased awareness of safety General Comments: Pt with slow responses and minimal speech output. Requires increased time for processing information and commands, but responded appropriately with increased time. Attention: Sustained Sustained Attention: Impaired Sustained Attention Impairment: Verbal basic Memory: Impaired Memory Impairment: Storage deficit, Retrieval deficit, Decreased recall of new information, Decreased short term memory Decreased Short Term Memory: Verbal basic Awareness: Impaired Awareness Impairment: Emergent impairment, Anticipatory  impairment, Intellectual impairment Problem Solving: Impaired Problem Solving Impairment: Functional basic Executive Function: Self Monitoring, Self Correcting Self Monitoring: Appears intact Self Correcting: Appears intact Safety/Judgment: Impaired   Extremity Assessment (includes Sensation/Coordination)  Upper Extremity Assessment: LUE deficits/detail LUE Deficits / Details: STRENGTH: 4/5 overall; ROM: wfl with increased time; SENSATION: light touch intact LUE Coordination: decreased fine motor, decreased gross motor (uanble to achieve thumb opposition)  Lower Extremity Assessment: LLE deficits/detail LLE Deficits / Details: STRENGTH: 4/5; ROM: unable to achieve full thumb opposition, others wfl with increased time; SENSATION: light touch intact LLE Coordination: decreased gross motor    ADLs  Overall ADL's : Needs assistance/impaired Upper Body Dressing : Minimal assistance, Sitting Lower Body Dressing: Moderate assistance, Sit to/from stand Toilet Transfer: Minimal assistance, Ambulation, BSC, Cueing for safety Toileting- Clothing Manipulation and Hygiene: Minimal assistance, Sit to/from stand Toileting - Clothing Manipulation Details (indicate cue type and reason): for sit-stand Functional mobility during ADLs: Minimal assistance, Cueing for safety General ADL Comments: Pt with L lateral lean and impaired balance during transfers and standing tasks. Educated pt to use LUE as much as possible throughout day with support from R hand - pt verbalized understanding. No family present for OT eval.    Mobility  Overal bed mobility: Needs Assistance Bed Mobility: Supine to Sit Supine to sit: Supervision General bed mobility comments: increased time, relies heavily on bed rail    Transfers  Overall transfer level: Needs assistance Equipment used: Rolling walker (2 wheeled) Transfers: Sit to/from Stand Sit to Stand: Min assist General transfer comment: assist for safety,  cues for hand placement    Ambulation / Gait / Stairs / Wheelchair Mobility  Ambulation/Gait Ambulation/Gait assistance: Min assist, Mod assist Ambulation Distance (Feet): 120 Feet Assistive device: Rolling walker (2 wheeled) Gait Pattern/deviations: Step-to pattern, Decreased step length - right, Decreased weight shift to left, Shuffle, Narrow base of support General Gait Details: cues and facilitation for  R weight shift for improved L foot clearance and step length. assist with walker esp on turns due to L hand weakness difficulty gripping walker Gait velocity: slow Gait velocity interpretation: Below normal speed for age/gender    Posture / Balance Dynamic Sitting Balance Sitting balance - Comments: performed seated lateral weight shifts with facilitation through ribs and cues for less head and shoulder movement Balance Overall balance assessment: Needs assistance Sitting-balance support: Feet supported, No upper extremity supported Sitting balance-Leahy Scale: Fair Sitting balance - Comments: performed seated lateral weight shifts with facilitation through ribs and cues for less head and shoulder movement Standing balance support: No upper extremity supported, During functional activity Standing balance-Leahy Scale: Poor Standing balance comment: UE support and assist for balance in static standing High Level Balance Comments: in hallway standing L side to rail holding rail performed lateral L/R weight shifts; gait with wall rail and manual facilitation for L weight shift with cues for increased R step length about 30' and backwards walking 10' with min A with wall rail and cues for weight shift    Special needs/care consideration BiPAP/CPAP No CPM No Continuous Drip IV No Dialysis No  Life Vest No Oxygen No Special Bed No Trach Size No Wound Vac (area) No  Skin No  Bowel mgmt: Last BM 05/15/15 Bladder mgmt: Using  urinal Diabetic mgmt Yes, on oral medications at home    Previous Home Environment Living Arrangements: Children (son) Lives With: Son Available Help at Discharge: Family, Available PRN/intermittently Type of Home: House Home Layout: One level Home Access: Stairs to enter Entrance Stairs-Rails: None Technical brewer of Steps: 2 Bathroom Shower/Tub: Chiropodist: Standard Home Care Services: Yes  Discharge Living Setting Plans for Discharge Living Setting: Patient's home, House, Lives with (comment) (Lives with son and son's girlfriend.) Type of Home at Discharge: House Discharge Home Layout: One level Discharge Home Access: Stairs to enter Entrance Stairs-Number of Steps: 2 steps Does the patient have any problems obtaining your medications?: No  Social/Family/Support Systems Patient Roles: Parent, Other (Comment) (Has an ex-wife, son and son's girlfriend.) Contact Information: Maxamus Peric - ex-wife - [phone] Anticipated Caregiver: son and son's girlfriend Ability/Limitations of Caregiver: Son works 6 am to 5 pm, his girlfriend gets home between 12 and 2:30 pm. Caregiver Availability: Intermittent Discharge Plan Discussed with Primary Caregiver: Yes Is Caregiver In Agreement with Plan?: Yes Does Caregiver/Family have Issues with Lodging/Transportation while Pt is in Rehab?: No  Goals/Additional Needs Patient/Family Goal for Rehab: PT/OT mod I and supervision, ST mod I and I goals Expected length of stay: 8-13 days Cultural Considerations: None Dietary Needs: Heart healthy, carb mod, thin liquids Equipment Needs: TBD Pt/Family Agrees to Admission and willing to participate: Yes Program Orientation Provided & Reviewed with Pt/Caregiver Including Roles & Responsibilities: Yes  Decrease burden of Care through IP rehab admission: N/A  Possible need for SNF placement upon discharge: Not planned  Patient Condition: This patient's condition  remains as documented in the consult dated 05/16/15, in which the Rehabilitation Physician determined and documented that the patient's condition is appropriate for intensive rehabilitative care in an inpatient rehabilitation facility. Will admit to inpatient rehab today.  Preadmission Screen Completed By: Billy Torres, 05/17/2015 12:30 PM ______________________________________________________________________  Discussed status with Dr. Posey Pronto on 05/17/15 at 56 and received telephone approval for admission today.  Admission Coordinator: Billy Torres, time1230/Date05/10/17          Cosigned by: Ankit Lorie Phenix, MD at 05/17/2015 12:34 PM  Revision History     Date/Time User Provider Type Action   05/17/2015 12:34 PM Ankit Lorie Phenix, MD Physician Cosign   05/17/2015 12:30 PM Billy Diones, RN Rehab Admission Coordinator Sign

## 2015-05-17 NOTE — Progress Notes (Signed)
Ankit Lorie Phenix, MD Physician Signed Physical Medicine and Rehabilitation Consult Note 05/16/2015 8:33 AM  Related encounter: ED to Hosp-Admission (Current) from 05/14/2015 in [place] All Collapse All        Physical Medicine and Rehabilitation Consult   Reason for Consult: Left sided weakness Referring Physician: Dr. Nori Riis    HPI: Billy Torres is a 60 y.o. RH-male with history of T2DM, HTN, L-MCA infarct 2015 without residual deficits who had been off plavix/ASA X 1 month. He was admitted on 05/14/15 with mild left sided weakness X 2 days with multiple falls MRI/MRA brain showed small acute non-hemorrhagic infarct posterior right corona radiata to posterior limb of internal capsule, global atropy and remote/chronic small vessel disease. 2D echo with EF 65-70% without abnormality. Carotid dopplers without ICA stenosis. He did have worsening of symptoms with confusion after admission and follow up MRI showed minimal enlargement. ASA and plavix resumed and Dr. Leonie Man felt that stroke was due to small vessel disease. Patient with resultant left sided weakness with ataxia, problems with processing with cognitive deficits and balance deficits. Therapy evaluation done yesterday and CIR recommended for follow up therapy.    Review of Systems  HENT: Negative for hearing loss.  Eyes: Negative for blurred vision and double vision.  Respiratory: Negative for cough and shortness of breath.  Cardiovascular: Negative for chest pain and palpitations.  Gastrointestinal: Negative for nausea, vomiting and constipation.  Genitourinary: Negative for dysuria and urgency.  Musculoskeletal: Negative for myalgias, back pain and joint pain.  Skin: Negative for itching.  Neurological: Positive for sensory change and focal weakness. Negative for dizziness and headaches.  Psychiatric/Behavioral: The patient does not have insomnia.  All other systems  reviewed and are negative.     Past Medical History  Diagnosis Date  . Diabetes mellitus without complication ([place])   . Hypertension   . Hyperlipidemia     Past Surgical History  Procedure Laterality Date  . Neck surgery      Family History  Problem Relation Age of Onset  . Hypertension Mother   . Hypertension Father     Social History: Lives with family--son manages home and does most of cooking. Used to work in [employer] till last year. Independent PTA-- but sedentary PTA. reports that he has been smoking Cigarettes--2-3 daily?. He does not have any smokeless tobacco history on file. He reports that he does not drink alcohol or use illicit drugs.    Allergies: No Known Allergies    Medications Prior to Admission  Medication Sig Dispense Refill  . L-Arginine 500 MG CAPS Take 1 capsule by mouth daily.     . Multiple Vitamins-Minerals (CENTRUM ADULTS PO) Take by mouth.    Marland Kitchen aspirin EC 81 MG tablet Take 1 tablet (81 mg total) by mouth daily. (Patient not taking: Reported on 05/12/2015) 30 tablet 2  . atorvastatin (LIPITOR) 40 MG tablet Take 1 tablet (40 mg total) by mouth daily. (Patient not taking: Reported on 05/12/2015) 30 tablet 2  . Blood Glucose Monitoring Suppl (TRUE METRIX AIR GLUCOSE METER) DEVI 1 Device by Does not apply route 2 (two) times daily. 1 Device 0  . clopidogrel (PLAVIX) 75 MG tablet Take 1 tablet (75 mg total) by mouth daily with breakfast. (Patient not taking: Reported on 02/28/2014) 30 tablet 0  . glucose blood (TRUE METRIX BLOOD GLUCOSE TEST) test strip Use as instructed 100 each 12  . lisinopril (PRINIVIL,ZESTRIL) 10 MG  tablet Take 1 tablet (10 mg total) by mouth daily. 90 tablet 1  . lovastatin (MEVACOR) 20 MG tablet Take 1 tablet (20 mg total) by mouth at bedtime. 90 tablet 1  . metFORMIN (GLUCOPHAGE) 500 MG tablet Take 1 tablet (500 mg total) by mouth 2  (two) times daily with a meal. 60 tablet 0  . sitaGLIPtin-metformin (JANUMET) 50-1000 MG tablet Take 1 tablet by mouth 2 (two) times daily with a meal. 180 tablet 1    Home: Home Living Family/patient expects to be discharged to:: Private residence Living Arrangements: Children (son) Available Help at Discharge: Family, Available PRN/intermittently Type of Home: House Home Access: Stairs to enter Technical brewer of Steps: 2 Entrance Stairs-Rails: None Home Layout: One level Bathroom Shower/Tub: Chiropodist: Standard Home Equipment: None  Functional History: Prior Function Level of Independence: Independent Comments: doens't use AD, has liscense but doesn't drive. minimal cooking, only eats crackers until step-daughter or son gets home Functional Status:  Mobility: Bed Mobility Overal bed mobility: Needs Assistance Bed Mobility: Supine to Sit Supine to sit: Min guard General bed mobility comments: Increased time and effort. HOB slightly elevated, heavy use of bedrail. Transfers Overall transfer level: Needs assistance Equipment used: 1 person hand held assist Transfers: Sit to/from Stand Sit to Stand: Min assist General transfer comment: Min assist for balance due to L lateral lean. Verbal cues for safety Ambulation/Gait Ambulation/Gait assistance: Mod assist, Max assist Ambulation Distance (Feet): 20 Feet Assistive device: 1 person hand held assist, 2 person hand held assist Gait Pattern/deviations: Step-to pattern, Decreased step length - left, Decreased stance time - left, Decreased stride length, Ataxic, Staggering left, Narrow base of support General Gait Details: pt with poor L LE sequencing during gait, decerased step height, strong L lateral lean requiring maxA to maintain mindline Gait velocity: slow Gait velocity interpretation: Below normal speed for age/gender    ADL: ADL Overall ADL's : Needs assistance/impaired Upper  Body Dressing : Minimal assistance, Sitting Lower Body Dressing: Moderate assistance, Sit to/from stand Toilet Transfer: Minimal assistance, Ambulation, BSC, Cueing for safety Toileting- Clothing Manipulation and Hygiene: Minimal assistance, Sit to/from stand Toileting - Clothing Manipulation Details (indicate cue type and reason): for sit-stand Functional mobility during ADLs: Minimal assistance, Cueing for safety General ADL Comments: Pt with L lateral lean and impaired balance during transfers and standing tasks. Educated pt to use LUE as much as possible throughout day with support from R hand - pt verbalized understanding. No family present for OT eval.  Cognition: Cognition Overall Cognitive Status: Impaired/Different from baseline Orientation Level: Oriented X4 Cognition Arousal/Alertness: Awake/alert Behavior During Therapy: WFL for tasks assessed/performed Overall Cognitive Status: Impaired/Different from baseline Area of Impairment: Safety/judgement, Problem solving Safety/Judgement: Decreased awareness of safety Problem Solving: Slow processing General Comments: Pt with slow responses and minimal speech output. Requires increased time for processing information and commands, but responded appropriately with increased time.   Blood pressure 124/72, pulse 72, temperature 98 F (36.7 C), temperature source Oral, resp. rate 18, height 5\' 8"  (1.727 m), weight 67.586 kg (149 lb), SpO2 100 %. Physical Exam  Vitals reviewed. Constitutional: He is oriented to person, place, and time. He appears well-developed and well-nourished.  HENT:  Head: Normocephalic and atraumatic.  Mouth/Throat: Oropharynx is clear and moist.  Eyes: Conjunctivae and EOM are normal. Pupils are equal, round, and reactive to light.  Neck: Normal range of motion. Neck supple.  Cardiovascular: Normal rate and regular rhythm.  No murmur heard. Respiratory: Effort normal and  breath sounds normal. No stridor.  He has no wheezes.  GI: Soft. Bowel sounds are normal. He exhibits no distension. There is no tenderness.  Musculoskeletal: He exhibits no edema or tenderness.  Neurological: He is alert and oriented to person, place, and time. He displays normal reflexes.  Mild dysarthria with mild left facial weakness.  Able to follow simple motor commands without difficulty. Mild diminished sensation along left side, but significantly improved/almost resolved Motor: Left upper extremity: 4/5 proximal to distal Left lower cervical and 4+/5 proximal to distal Right upper/right lower extremity 5/5 proximal to distal  Skin: Skin is warm and dry.  Psychiatric: His affect is blunt. He is slowed. He is attentive.     Lab Results Last 24 Hours    Results for orders placed or performed during the hospital encounter of 05/14/15 (from the past 24 hour(s))  Urinalysis, dipstick only Status: Abnormal   Collection Time: 05/15/15 9:23 AM  Result Value Ref Range   Color, Urine YELLOW YELLOW   APPearance CLOUDY (A) CLEAR   Specific Gravity, Urine 1.022 1.005 - 1.030   pH 5.5 5.0 - 8.0   Glucose, UA NEGATIVE NEGATIVE mg/dL   Hgb urine dipstick NEGATIVE NEGATIVE   Bilirubin Urine NEGATIVE NEGATIVE   Ketones, ur NEGATIVE NEGATIVE mg/dL   Protein, ur NEGATIVE NEGATIVE mg/dL   Nitrite NEGATIVE NEGATIVE   Leukocytes, UA NEGATIVE NEGATIVE  Glucose, capillary Status: Abnormal   Collection Time: 05/15/15 11:29 AM  Result Value Ref Range   Glucose-Capillary 102 (H) 65 - 99 mg/dL   Comment 1 Notify RN    Comment 2 Document in Chart   Glucose, capillary Status: Abnormal   Collection Time: 05/15/15 4:22 PM  Result Value Ref Range   Glucose-Capillary 162 (H) 65 - 99 mg/dL   Comment 1 Notify RN    Comment 2 Document in Chart   Glucose, capillary Status: Abnormal   Collection Time: 05/15/15 9:15 PM  Result  Value Ref Range   Glucose-Capillary 109 (H) 65 - 99 mg/dL   Comment 1 Notify RN    Comment 2 Document in Chart   Glucose, capillary Status: Abnormal   Collection Time: 05/16/15 1:24 AM  Result Value Ref Range   Glucose-Capillary 129 (H) 65 - 99 mg/dL   Comment 1 Notify RN    Comment 2 Document in Chart   Glucose, capillary Status: Abnormal   Collection Time: 05/16/15 5:33 AM  Result Value Ref Range   Glucose-Capillary 127 (H) 65 - 99 mg/dL   Comment 1 Notify RN    Comment 2 Document in Chart       Imaging Results (Last 48 hours)    Dg Chest 2 View  05/14/2015 CLINICAL DATA: 60 year old male with acute shortness of breath. EXAM: CHEST 2 VIEW COMPARISON: 10/13/2007 chest radiograph FINDINGS: Upper limits normal heart size again noted as well as small peribronchial thickening. There is no evidence of focal airspace disease, pulmonary edema, suspicious pulmonary nodule/mass, pleural effusion, or pneumothorax. No acute bony abnormalities are identified. IMPRESSION: No active cardiopulmonary disease. Electronically Signed By: Margarette Canada M.D. On: 05/14/2015 21:59   Ct Head Wo Contrast  05/14/2015 CLINICAL DATA: LEFT side weakness which has persisted since Saturday, history hypertension, diabetes mellitus, smoker EXAM: CT HEAD WITHOUT CONTRAST TECHNIQUE: Contiguous axial images were obtained from the base of the skull through the vertex without intravenous contrast. COMPARISON: 06/27/2013 FINDINGS: Generalized atrophy. Normal ventricular morphology. No midline shift or mass effect. Old lacunar infarct anterior LEFT basal ganglia and  anterior limb of LEFT internal capsule. Old RIGHT periventricular white matter infarct and frontal region. Small vessel chronic ischemic changes of deep cerebral white matter. No intracranial hemorrhage, mass lesion or evidence acute infarction. No extra-axial fluid collections. Tiny air-fluid level  RIGHT maxillary sinus with minimal mucous in LEFT sphenoid sinus. Bones, mastoid air cells, and remaining sinuses unremarkable. IMPRESSION: Atrophy with small vessel chronic ischemic changes of deep cerebral white matter. Old lacunar infarcts RIGHT periventricular white matter and anterior LEFT basal ganglia/internal capsule. Electronically Signed By: Lavonia Dana M.D. On: 05/14/2015 14:54   Mr Brain Wo Contrast  05/15/2015 CLINICAL DATA: New onset altered mental status/ confusion. Increased left-sided weakness/ numbness. EXAM: MRI HEAD WITHOUT CONTRAST TECHNIQUE: Multiplanar, multiecho pulse sequences of the brain and surrounding structures were obtained without intravenous contrast. COMPARISON: 05/14/2015 FINDINGS: Acute right lateral lenticulostriate territory infarct has minimally enlarged in the interim and extends from the posterior corona radiata to the posterior lentiform nucleus. No acute infarct is identified elsewhere. Chronic infarcts are again seen in the corona radiata and basal ganglia bilaterally, with associated chronic blood products better demonstrated on the prior 3T study. There is no evidence of acute intracranial hemorrhage, mass, midline shift, or extra-axial fluid collection. Mild generalized cerebral atrophy is noted. Patchy T2 hyperintensities throughout the cerebral white matter bilaterally are unchanged and compatible with moderate chronic small vessel ischemic disease. Orbits are unremarkable. A small amount of fluid is again seen in the right maxillary sinus with increased, mild mucosal thickening. Mucosal thickening is mild in the right frontal and left sphenoid sinuses and moderate in the right ethmoid air cells, slightly increased. Mastoid air cells are clear. Major intracranial vascular flow voids are preserved. IMPRESSION: 1. Minimal enlargement of acute right corona radiata/ basal ganglia region infarct. 2. Moderate chronic small vessel ischemic changes as above.  Electronically Signed By: Logan Bores M.D. On: 05/15/2015 10:16   Mr Brain Wo Contrast  05/14/2015 CLINICAL DATA: 60 year old diabetic hypertensive male fell in shower. Left-sided weakness started yesterday. Subsequent encounter. EXAM: MRI HEAD WITHOUT CONTRAST TECHNIQUE: Multiplanar, multiecho pulse sequences of the brain and surrounding structures were obtained without intravenous contrast. COMPARISON: 05/14/2015 CT. 06/28/2013 MR. FINDINGS: Small acute nonhemorrhagic infarct posterior right corona radiata extending to the posterior superior aspect of the posterior limb of the right internal capsule. Remote left lenticular nucleus/ caudate infarct with blood-stained encephalomalacia. Remote mid right corona radiata infarct with blood-stained encephalomalacia. No other areas of intracranial hemorrhage. Moderate to marked chronic microvascular changes. Mild global atrophy without hydrocephalus. No intracranial mass lesion noted on this unenhanced exam. Major intracranial vascular structures are patent. Paranasal sinus mucosal thickening. Nasal turbinate mucosal thickening. Minimal exophthalmos otherwise orbital structures unremarkable. Mild spinal stenosis C3-4 with prominent left facet joint degenerative changes. Minimal sclerosis C2 and C3 vertebral body without significant change. Cervical medullary junction unremarkable. IMPRESSION: Small acute nonhemorrhagic infarct posterior right corona radiata extending to the posterior superior aspect of the posterior limb of the right internal capsule. Remote infarcts and chronic microvascular changes as noted above. Global atrophy. Paranasal sinus mucosal thickening. Electronically Signed By: Genia Del M.D. On: 05/14/2015 16:54   Mr Jodene Nam Head/brain Wo Cm  05/14/2015 CLINICAL DATA: Acute infarct. EXAM: MRA HEAD WITHOUT CONTRAST TECHNIQUE: Angiographic images of the Circle of Willis were obtained using MRA technique without intravenous contrast.  COMPARISON: 06/28/2013 FINDINGS: Symmetric carotid and vertebral arteries. Symmetric and normal pattern vertebrobasilar branching. No communicating branches are noted. No proximal occlusion. No flow limiting stenosis in the carotid, vertebral, or basilar arteries.  Shallow outpouching from the right supraclinoid ICA projecting inferiorly is usually unresolved infundibulum given shape. There is atherosclerotic type irregularity of bilateral MCA branches with high-grade and progressive stenosis of inferior division right M2 branch. Proximal high-grade narrowing of the first branch left MCA is stable from prior. No notable posterior circulation stenosis. IMPRESSION: 1. No acute arterial finding. 2. Intracranial atherosclerosis with high-grade and progressive right M2 segment stenosis compared to 2015. Electronically Signed By: Monte Fantasia M.D. On: 05/14/2015 20:46     Assessment/Plan: Diagnosis: small acute non-hemorrhagic infarct posterior right corona radiata to posterior limb of internal capsule Labs and images independently reviewed. Records reviewed and summated above. Stroke: Continue secondary stroke prophylaxis and Risk Factor Modification listed below:  Antiplatelet therapy:  Blood Pressure Management: Continue current medication with prn's with permisive HTN per primary team Statin Agent:  Diabetes management:  Tobacco abuse: Counsel Left sided hemiparesis Motor recovery: Fluoxetine  1. Does the need for close, 24 hr/day medical supervision in concert with the patient's rehab needs make it unreasonable for this patient to be served in a less intensive setting? Yes  2. Co-Morbidities requiring supervision/potential complications: DM type 2(Monitor in accordance with exercise and adjust meds as necessary), HTN (monitor and provide prns in accordance with increased physical exertion and pain), L-MCA infarct 2015 without residual deficits, ataxia, cognitive deficits,  balance deficits, tobacco abuse (counsel) 3. Due to safety, disease management, medication administration and patient education, does the patient require 24 hr/day rehab nursing? Yes 4. Does the patient require coordinated care of a physician, rehab nurse, PT (1-2 hrs/day, 5 days/week), OT (1-2 hrs/day, 5 days/week) and SLP (1-2 hrs/day, 5 days/week) to address physical and functional deficits in the context of the above medical diagnosis(es)? Yes Addressing deficits in the following areas: balance, endurance, locomotion, strength, transferring, bathing, toileting, cognition, speech and psychosocial support 5. Can the patient actively participate in an intensive therapy program of at least 3 hrs of therapy per day at least 5 days per week? Yes 6. The potential for patient to make measurable gains while on inpatient rehab is excellent 7. Anticipated functional outcomes upon discharge from inpatient rehab are modified independent and supervision with PT, modified independent and supervision with OT, independent and modified independent with SLP. 8. Estimated rehab length of stay to reach the above functional goals is: 8-13 days. 9. Does the patient have adequate social supports and living environment to accommodate these discharge functional goals? Potentially 10. Anticipated D/C setting: Home 11. Anticipated post D/C treatments: HH therapy and Home excercise program 12. Overall Rehab/Functional Prognosis: good  RECOMMENDATIONS: This patient's condition is appropriate for continued rehabilitative care in the following setting: CIR Patient has agreed to participate in recommended program. Yes Note that insurance prior authorization may be required for reimbursement for recommended care.  Comment: Rehab Admissions Coordinator to follow up.  Delice Lesch, MD 05/16/2015       Revision History     Date/Time User Provider Type Action   05/16/2015 11:20 AM Ankit Lorie Phenix, MD Physician Sign    05/16/2015 10:52 AM Bary Leriche, PA-C Physician Assistant Share   View Details Report       Routing History     Date/Time From To Method   05/16/2015 11:20 AM Ankit Lorie Phenix, MD Micheline Chapman, NP In [facility]

## 2015-05-17 NOTE — Care Management Note (Signed)
Case Management Note  Patient Details  Name: Billy Torres MRN: [id number] Date of Birth: 1955/05/22  Subjective/Objective:                    Action/Plan: Patient discharging to CIR today. No further needs per CM.   Expected Discharge Date:  05/17/15               Expected Discharge Plan:  [place]  In-House Referral:     Discharge planning Services  CM Consult  Post Acute Care Choice:    Choice offered to:     DME Arranged:    DME Agency:     HH Arranged:    HH Agency:     Status of Service:  Completed, signed off  Medicare Important Message Given:    Date Medicare IM Given:    Medicare IM give by:    Date Additional Medicare IM Given:    Additional Medicare Important Message give by:     If discussed at [place] of Stay Meetings, dates discussed:    Additional Comments:  [name], RN 05/17/2015, 11:17 AM

## 2015-05-17 NOTE — Progress Notes (Signed)
Family Medicine Teaching Service Daily Progress Note Intern Pager: [phone]  Patient name: Billy Torres Medical record number: [id number] Date of birth: 08/28/1955 Age: 60 y.o. Gender: male  Primary Care Provider: Sharon Seller, NP Consultants: Neurology  Code Status: Full   Pt Overview and Major Events to Date:  4/7- admitted with stroke  Assessment and Plan: Billy Torres is a 60 y.o. male presenting with weakness and concern for CVA. PMH is significant for HLD, CVA 2015 without residual deficits, DMII.   # CVA- improved. Presented with  LUE & LLE weakness. Has a hx of CVA with left sided weakness in 2015 that has resolved. Supposedly on Plavix and ASA but hasn't been taking. CT head negative. MRI positive for small nonhemorrhagic infarct right corona radiata posterior limb internal capsule. Repeat MRI with worsening of this infarct. MRA with intracranial atherosclerosis with high-grade and progressive right M2 segment stenosis compared to 2015. Carotid doppler with mild stenosis. Echo with EF of 65-70%, without other significant abnormality. TSH normal.  - Appreciate neuro recs:  -continue DAPT, PT and OT  -Signed off - Permissive HTN for 5-7 days - Continue Lipitor, Plavix, ASA  - Evaluated by CIR for admission. Waiting on prior Auth. - Neuro checks q4 hr.   # HLD: had lipid panel: 216/111/48/146 - Continue lipitor.   # DMII: A1c 7.2 - Moderate SSI.  - Holding metformin, and janumet. Taking janumet sporadically at home.  - Discharge on metformin 1 gm twice a day.  FEN/GI:  Heart healthy-carb modified  Prophylaxis: SCD's.   Subjective:  No acute event overnight. Says he slept better overnight but awaken at 2 pm. Says his arm weakness has improved. Denies other neurologic symptoms  Objective: Temp:  [97.5 F (36.4 C)-98.2 F (36.8 C)] 97.5 F (36.4 C) (05/10 0124) Pulse Rate:  [66-79] 78 (05/10 0124) Resp:  [16-20] 18 (05/10 0124) BP: (113-124)/(64-79)  118/79 mmHg (05/10 0124) SpO2:  [99 %-100 %] 100 % (05/10 0124) Physical Exam: GEN: lying in bed, appears well, NAD Oropharynx: clear, moist CVS: regular rate and rythm, normal s1 and s2 RESP: no increased work of breathing GI: normal bowel sound, soft, non-tender,non-distended NEURO: alert and oriented x4, motor 4/5 in LUE, 5/5 else where, speech clear, CN II-XII intact, patellar and biceps reflexes 2+, sensation intact in all dermatome including his LUE. PSYCH: good affect  Laboratory:  Recent Labs Lab 05/12/15 0950 05/14/15 1325 05/14/15 1350  WBC 8.2 8.6  --   HGB 15.3 15.6 18.7*  HCT 47.6 48.7 55.0*  PLT 255 212  --     Recent Labs Lab 05/12/15 0950 05/14/15 1325 05/14/15 1350  NA 140 141 142  K 4.6 4.0 3.9  CL 103 106 107  CO2 25 21*  --   BUN 15 12 15   CREATININE 1.00 0.99 0.90  CALCIUM 9.4 9.4  --   PROT 7.4 8.1  --   BILITOT 0.6 0.7  --   ALKPHOS 57 60  --   ALT 16 19  --   AST 16 18  --   GLUCOSE 168* 154* 150*    TSH normal  Imaging/Diagnostic Tests: No results found.  Mercy Riding, MD 05/17/2015, 5:11 AM PGY-1, [place] Intern pager: [phone], text pages welcome

## 2015-05-17 NOTE — Interval H&P Note (Signed)
Sahr Chapnick Dufner was admitted today to Inpatient Rehabilitation with the diagnosis of right corona radiata infarct.  The patient's history has been reviewed, patient examined, and there is no change in status.  Patient continues to be appropriate for intensive inpatient rehabilitation.  I have reviewed the patient's chart and labs.  Questions were answered to the patient's satisfaction. The PAPE has been reviewed and assessment remains appropriate.  Zacherie Honeyman Lorie Phenix 05/17/2015, 11:41 PM

## 2015-05-17 NOTE — Progress Notes (Signed)
Pt is being discharged to rehab CIR. Discharge instructions were given to patient and family

## 2015-05-17 NOTE — H&P (Signed)
Physical Medicine and Rehabilitation Admission H&P    Chief Complaint  Patient presents with  . Left sided weakness and cognitive deficits.     HPI:   Billy Torres is a 60 y.o. RH-male with history of T2DM, HTN, L-MCA infarct 2015 without residual deficits who had been off plavix/ASA X 1 month. He was admitted on 05/14/15 with mild left sided weakness X 2 days with multiple falls MRI/MRA brain showed small acute non-hemorrhagic infarct posterior right corona radiata to posterior limb of internal capsule, global atropy and remote/chronic small vessel disease. 2D echo with EF 65-70% without abnormality. Carotid dopplers without ICA stenosis. He did have worsening of symptoms with confusion after admission and follow up MRI showed minimal enlargement. Dr. Leonie Man recommended resuming ASA 81 mg and plavix for stroke felt to be due to small vessel disease. Patient with resultant left sided weakness with ataxia, problems with processing with cognitive deficits and balance deficits. Therapy evaluation done yesterday and CIR recommended for follow up therapy.    Review of Systems  HENT: Positive for hearing loss.   Eyes: Negative for blurred vision and double vision.  Respiratory: Negative for cough, sputum production and wheezing.   Cardiovascular: Negative for chest pain and palpitations.  Gastrointestinal: Negative for heartburn, nausea, abdominal pain and constipation.  Genitourinary: Negative for dysuria and urgency.  Musculoskeletal: Negative for myalgias, back pain and joint pain.  Neurological: Positive for focal weakness. Negative for dizziness, tingling, speech change (denies speech change) and headaches.  Psychiatric/Behavioral: Negative for depression.  All other systems reviewed and are negative.   Past Medical History  Diagnosis Date  . Diabetes mellitus without complication ([place])   . Hypertension   . Hyperlipidemia     Past Surgical History  Procedure Laterality  Date  . Neck surgery      Family History  Problem Relation Age of Onset  . Hypertension Mother   . Hypertension Father    Social History: Lives with family--son manages home and does most of cooking. Used to work in [employer] till last year. Independent PTA-- but sedentary PTA. reports that he has been smoking Cigarettes--2-3 daily?. He does not have any smokeless tobacco history on file. He reports that he does not drink alcohol or use illicit drugs.   Allergies: No Known Allergies   Medications Prior to Admission  Medication Sig Dispense Refill  . L-Arginine 500 MG CAPS Take 1 capsule by mouth daily.     . Multiple Vitamins-Minerals (CENTRUM ADULTS PO) Take by mouth.    Marland Kitchen aspirin EC 81 MG tablet Take 1 tablet (81 mg total) by mouth daily. (Patient not taking: Reported on 05/12/2015) 30 tablet 2  . atorvastatin (LIPITOR) 40 MG tablet Take 1 tablet (40 mg total) by mouth daily. (Patient not taking: Reported on 05/12/2015) 30 tablet 2  . Blood Glucose Monitoring Suppl (TRUE METRIX AIR GLUCOSE METER) DEVI 1 Device by Does not apply route 2 (two) times daily. 1 Device 0  . clopidogrel (PLAVIX) 75 MG tablet Take 1 tablet (75 mg total) by mouth daily with breakfast. (Patient not taking: Reported on 02/28/2014) 30 tablet 0  . glucose blood (TRUE METRIX BLOOD GLUCOSE TEST) test strip Use as instructed 100 each 12  . lovastatin (MEVACOR) 20 MG tablet Take 1 tablet (20 mg total) by mouth at bedtime. 90 tablet 1  . metFORMIN (GLUCOPHAGE) 500 MG tablet Take 1 tablet (500 mg total) by mouth 2 (two) times daily with a meal. 60 tablet 0  .  sitaGLIPtin-metformin (JANUMET) 50-1000 MG tablet Take 1 tablet by mouth 2 (two) times daily with a meal. 180 tablet 1  . [DISCONTINUED] lisinopril (PRINIVIL,ZESTRIL) 10 MG tablet Take 1 tablet (10 mg total) by mouth daily. 90 tablet 1    Home: Home Living Family/patient expects to be discharged to:: Private residence Living Arrangements: Children  (son) Available Help at Discharge: Family, Available PRN/intermittently Type of Home: House Home Access: Stairs to enter Technical brewer of Steps: 2 Entrance Stairs-Rails: None Home Layout: One level Bathroom Shower/Tub: Chiropodist: Standard Home Equipment: None  Lives With: Son   Functional History: Prior Function Level of Independence: Independent Comments: doens't use AD, has liscense but doesn't drive. minimal cooking, only eats crackers until step-daughter or son gets home  Functional Status:  Mobility: Bed Mobility Overal bed mobility: Needs Assistance Bed Mobility: Supine to Sit Supine to sit: Supervision General bed mobility comments: increased time, relies heavily on bed rail Transfers Overall transfer level: Needs assistance Equipment used: Rolling walker (2 wheeled) Transfers: Sit to/from Stand Sit to Stand: Min assist General transfer comment: assist for safety, cues for hand placement Ambulation/Gait Ambulation/Gait assistance: Min assist, Mod assist Ambulation Distance (Feet): 120 Feet Assistive device: Rolling walker (2 wheeled) Gait Pattern/deviations: Step-to pattern, Decreased step length - right, Decreased weight shift to left, Shuffle, Narrow base of support General Gait Details: cues and facilitation for R weight shift for improved L foot clearance and step length.  assist with walker esp on turns due to L hand weakness difficulty gripping walker Gait velocity: slow Gait velocity interpretation: Below normal speed for age/gender    ADL: ADL Overall ADL's : Needs assistance/impaired Upper Body Dressing : Minimal assistance, Sitting Lower Body Dressing: Moderate assistance, Sit to/from stand Toilet Transfer: Minimal assistance, Ambulation, BSC, Cueing for safety Toileting- Clothing Manipulation and Hygiene: Minimal assistance, Sit to/from stand Toileting - Clothing Manipulation Details (indicate cue type and reason): for  sit-stand Functional mobility during ADLs: Minimal assistance, Cueing for safety General ADL Comments: Pt with L lateral lean and impaired balance during transfers and standing tasks. Educated pt to use LUE as much as possible throughout day with support from R hand - pt verbalized understanding. No family present for OT eval.  Cognition: Cognition Overall Cognitive Status: Impaired/Different from baseline Arousal/Alertness: Awake/alert Orientation Level: Oriented X4 Attention: Sustained Sustained Attention: Impaired Sustained Attention Impairment: Verbal basic Memory: Impaired Memory Impairment: Storage deficit, Retrieval deficit, Decreased recall of new information, Decreased short term memory Decreased Short Term Memory: Verbal basic Awareness: Impaired Awareness Impairment: Emergent impairment, Anticipatory impairment, Intellectual impairment Problem Solving: Impaired Problem Solving Impairment: Functional basic Executive Function: Self Monitoring, Self Correcting Self Monitoring: Appears intact Self Correcting: Appears intact Safety/Judgment: Impaired Cognition Arousal/Alertness: Awake/alert Behavior During Therapy: WFL for tasks assessed/performed Overall Cognitive Status: Impaired/Different from baseline Area of Impairment: Safety/judgement, Problem solving Safety/Judgement: Decreased awareness of safety Problem Solving: Slow processing General Comments: Pt with slow responses and minimal speech output. Requires increased time for processing information and commands, but responded appropriately with increased time.   Blood pressure 131/86, pulse 89, temperature 98.2 F (36.8 C), temperature source Oral, resp. rate 20, height 5\' 8"  (1.727 m), weight 67.586 kg (149 lb), SpO2 98 %. Physical Exam  Constitutional: He is oriented to person, place, and time. He appears well-developed and well-nourished. No distress.  HENT:  Head: Normocephalic and atraumatic.  Mouth/Throat:  Oropharynx is clear and moist.  Eyes: Conjunctivae are normal. Pupils are equal, round, and reactive to light.  Neck:  Normal range of motion. Neck supple.  Respiratory: Effort normal and breath sounds normal. No stridor. No respiratory distress. He has no wheezes.  GI: Soft. Bowel sounds are normal. He exhibits no distension. There is no tenderness.  Musculoskeletal: He exhibits no edema or tenderness.  Neurological: He is alert and oriented to person, place, and time.  Mild dysarthria with mild left facial weakness.  Able to follow simple motor commands without difficulty. Sensation intact to light touch Has poor insight and lacks awareness of deficits Motor: Left upper extremity: 4/5 proximal to distal Left lower extremity and 4+/5 proximal to distal Right upper/right lower extremity 5/5 proximal to distal   Skin: Skin is warm and dry. He is not diaphoretic.  Psychiatric: He has a normal mood and affect. His behavior is normal. Thought content normal.    Results for orders placed or performed during the hospital encounter of 05/14/15 (from the past 48 hour(s))  Glucose, capillary     Status: Abnormal   Collection Time: 05/15/15  4:22 PM  Result Value Ref Range   Glucose-Capillary 162 (H) 65 - 99 mg/dL   Comment 1 Notify RN    Comment 2 Document in Chart   Glucose, capillary     Status: Abnormal   Collection Time: 05/15/15  9:15 PM  Result Value Ref Range   Glucose-Capillary 109 (H) 65 - 99 mg/dL   Comment 1 Notify RN    Comment 2 Document in Chart   Glucose, capillary     Status: Abnormal   Collection Time: 05/16/15  1:24 AM  Result Value Ref Range   Glucose-Capillary 129 (H) 65 - 99 mg/dL   Comment 1 Notify RN    Comment 2 Document in Chart   Glucose, capillary     Status: Abnormal   Collection Time: 05/16/15  5:33 AM  Result Value Ref Range   Glucose-Capillary 127 (H) 65 - 99 mg/dL   Comment 1 Notify RN    Comment 2 Document in Chart   Glucose, capillary     Status:  Abnormal   Collection Time: 05/16/15 11:28 AM  Result Value Ref Range   Glucose-Capillary 149 (H) 65 - 99 mg/dL   Comment 1 Notify RN    Comment 2 Document in Chart   Glucose, capillary     Status: Abnormal   Collection Time: 05/16/15  4:39 PM  Result Value Ref Range   Glucose-Capillary 102 (H) 65 - 99 mg/dL  Glucose, capillary     Status: Abnormal   Collection Time: 05/16/15  9:46 PM  Result Value Ref Range   Glucose-Capillary 159 (H) 65 - 99 mg/dL   Comment 1 Notify RN    Comment 2 Document in Chart   Glucose, capillary     Status: Abnormal   Collection Time: 05/17/15  6:00 AM  Result Value Ref Range   Glucose-Capillary 119 (H) 65 - 99 mg/dL   Comment 1 Notify RN    Comment 2 Document in Chart   Glucose, capillary     Status: Abnormal   Collection Time: 05/17/15 11:52 AM  Result Value Ref Range   Glucose-Capillary 157 (H) 65 - 99 mg/dL   No results found.    Medical Problem List and Plan: 1.  Left sided weakness, gait abnormalities secondary to right corona radiata infarct. 2.  DVT Prophylaxis/Anticoagulation: Pharmaceutical: Lovenox 3. Pain Management: N/A 4. Mood: LCSW to follow for evaluation and support.  5. Neuropsych: This patient is not fully capable of making decisions  on his own behalf. 6. Skin/Wound Care: Routine pressure relief measures. Maintain adequate nutritional and hydration status 7. Fluids/Electrolytes/Nutrition: Monitor I/O. Check lytes in am. 8. T2DM: Monitor BS ac/hs. Resume metformin 500 mg bid and trajenta 5 mg daily.  9. Dyslipidemia: on Lipitor daily.  10. HTN: Monitor BP bid --lisinopril recently added --hold to allow for adequate perfusion. Resume lisinopril if BP trends upwards.   Post Admission Physician Evaluation: 1. Functional deficits secondary  to right corona radiata infarct. 2. Patient is admitted to receive collaborative, interdisciplinary care between the physiatrist, rehab nursing staff, and therapy team. 3. Patient's level of  medical complexity and substantial therapy needs in context of that medical necessity cannot be provided at a lesser intensity of care such as a SNF. 4. Patient has experienced substantial functional loss from his/her baseline which was documented above under the "Functional History" and "Functional Status" headings.  Judging by the patient's diagnosis, physical exam, and functional history, the patient has potential for functional progress which will result in measurable gains while on inpatient rehab.  These gains will be of substantial and practical use upon discharge  in facilitating mobility and self-care at the household level. 5. Physiatrist will provide 24 hour management of medical needs as well as oversight of the therapy plan/treatment and provide guidance as appropriate regarding the interaction of the two. 6. 24 hour rehab nursing will assist with safety, disease management, medication administration and patient education and help integrate therapy concepts, techniques,education, etc. 7. PT will assess and treat for/with: Lower extremity strength, range of motion, stamina, balance, functional mobility, safety, adaptive techniques and equipment, coping skills, pain control, stroke education.   Goals are: Mod I/Supervision. 8. OT will assess and treat for/with: ADL's, functional mobility, safety, upper extremity strength, adaptive techniques and equipment, ego support, and community reintegration.   Goals are: Mod I/Supervision. Therapy may proceed with showering this patient. 9. SLP will assess and treat for/with: cognition.  Goals are: Mod I/Supervision. 10. Case Management and Social Worker will assess and treat for psychological issues and discharge planning. 11. Team conference will be held weekly to assess progress toward goals and to determine barriers to discharge. 12. Patient will receive at least 3 hours of therapy per day at least 5 days per week. 13. ELOS: 8-12 days.        14. Prognosis:  good  Delice Lesch, MD 05/17/2015

## 2015-05-18 ENCOUNTER — Inpatient Hospital Stay (HOSPITAL_COMMUNITY): Payer: Medicaid Other | Admitting: Speech Pathology

## 2015-05-18 ENCOUNTER — Inpatient Hospital Stay (HOSPITAL_COMMUNITY): Payer: Self-pay | Admitting: Physical Therapy

## 2015-05-18 ENCOUNTER — Inpatient Hospital Stay (HOSPITAL_COMMUNITY): Payer: Medicaid Other | Admitting: Occupational Therapy

## 2015-05-18 DIAGNOSIS — D72829 Elevated white blood cell count, unspecified: Secondary | ICD-10-CM | POA: Diagnosis present

## 2015-05-18 LAB — COMPREHENSIVE METABOLIC PANEL
ALBUMIN: 3.5 g/dL (ref 3.5–5.0)
ALT: 20 U/L (ref 17–63)
AST: 17 U/L (ref 15–41)
Alkaline Phosphatase: 53 U/L (ref 38–126)
Anion gap: 12 (ref 5–15)
BILIRUBIN TOTAL: 0.8 mg/dL (ref 0.3–1.2)
BUN: 14 mg/dL (ref 6–20)
CO2: 25 mmol/L (ref 22–32)
Calcium: 9.4 mg/dL (ref 8.9–10.3)
Chloride: 102 mmol/L (ref 101–111)
Creatinine, Ser: 1.14 mg/dL (ref 0.61–1.24)
GFR calc Af Amer: >60 mL/min (ref >60)
GFR calc non Af Amer: >60 mL/min (ref >60)
GLUCOSE: 131 mg/dL — AB (ref 65–99)
POTASSIUM: 4 mmol/L (ref 3.5–5.1)
SODIUM: 139 mmol/L (ref 135–145)
TOTAL PROTEIN: 7.2 g/dL (ref 6.5–8.1)

## 2015-05-18 LAB — GLUCOSE, CAPILLARY
GLUCOSE-CAPILLARY: 127 mg/dL — AB (ref 65–99)
GLUCOSE-CAPILLARY: 97 mg/dL (ref 65–99)
Glucose-Capillary: 146 mg/dL — ABNORMAL HIGH (ref 65–99)
Glucose-Capillary: 144 mg/dL — ABNORMAL HIGH (ref 65–99)

## 2015-05-18 LAB — CBC WITH DIFFERENTIAL/PLATELET
BASOS ABS: 0 10*3/uL (ref 0.0–0.1)
BASOS PCT: 0 %
Eosinophils Absolute: 0.2 10*3/uL (ref 0.0–0.7)
Eosinophils Relative: 2 %
HEMATOCRIT: 44.8 % (ref 39.0–52.0)
HEMOGLOBIN: 14.7 g/dL (ref 13.0–17.0)
Lymphocytes Relative: 25 %
Lymphs Abs: 2.9 10*3/uL (ref 0.7–4.0)
MCH: 27.4 pg (ref 26.0–34.0)
MCHC: 32.8 g/dL (ref 30.0–36.0)
MCV: 83.4 fL (ref 78.0–100.0)
MONO ABS: 0.8 10*3/uL (ref 0.1–1.0)
Monocytes Relative: 7 %
NEUTROS ABS: 7.8 10*3/uL — AB (ref 1.7–7.7)
NEUTROS PCT: 66 %
Platelets: 255 10*3/uL (ref 150–400)
RBC: 5.37 MIL/uL (ref 4.22–5.81)
RDW: 13.1 % (ref 11.5–15.5)
WBC: 11.8 10*3/uL — ABNORMAL HIGH (ref 4.0–10.5)

## 2015-05-18 LAB — URINALYSIS, ROUTINE W REFLEX MICROSCOPIC
Bilirubin Urine: NEGATIVE
GLUCOSE, UA: NEGATIVE mg/dL
Hgb urine dipstick: NEGATIVE
KETONES UR: NEGATIVE mg/dL
LEUKOCYTES UA: NEGATIVE
Nitrite: NEGATIVE
PH: 6 (ref 5.0–8.0)
Protein, ur: NEGATIVE mg/dL
SPECIFIC GRAVITY, URINE: 1.013 (ref 1.005–1.030)

## 2015-05-18 NOTE — Plan of Care (Signed)
Problem: RH BLADDER ELIMINATION Goal: RH STG MANAGE BLADDER WITH ASSISTANCE STG Manage Bladder With min Assistance  Outcome: Not Progressing incont of urine; ? UTI; await collection of urine specimen

## 2015-05-18 NOTE — Progress Notes (Signed)
[place] PHYSICAL MEDICINE & REHABILITATION     PROGRESS NOTE  Subjective/Complaints:  Patient sitting up in bed this morning eating breakfast. He calls me over to his room to tell me to put sugar and creamer in his coffee. He states he slept well overnight.  ROS: Denies CP, SOB, nausea, vomiting, diarrhea.  Objective: Vital Signs: Blood pressure 132/82, pulse 77, temperature 98.2 F (36.8 C), temperature source Oral, resp. rate 17, height 5\' 8"  (1.727 m), weight 65.318 kg (144 lb), SpO2 99 %. No results found.  Recent Labs  05/18/15 0444  WBC 11.8*  HGB 14.7  HCT 44.8  PLT 255    Recent Labs  05/18/15 0444  NA 139  K 4.0  CL 102  GLUCOSE 131*  BUN 14  CREATININE 1.14  CALCIUM 9.4   CBG (last 3)   Recent Labs  05/17/15 1654 05/17/15 2054 05/18/15 0641  GLUCAP 94 219* 146*    Wt Readings from Last 3 Encounters:  05/17/15 65.318 kg (144 lb)  05/14/15 67.586 kg (149 lb)  05/12/15 67.586 kg (149 lb)    Physical Exam:  BP 132/82 mmHg  Pulse 77  Temp(Src) 98.2 F (36.8 C) (Oral)  Resp 17  Ht 5\' 8"  (1.727 m)  Wt 65.318 kg (144 lb)  BMI 21.90 kg/m2  SpO2 99% Constitutional: He appears well-developed and well-nourished. No distress. Vital signs reviewed. HENT: Normocephalic and atraumatic.  Eyes: Conjunctivae and EOM are normal.  Respiratory: Effort normal and breath sounds normal. No stridor. No respiratory distress. He has no wheezes.  GI: Soft. Bowel sounds are normal. He exhibits no distension. There is no tenderness.  Musculoskeletal: He exhibits no edema or tenderness.  Neurological: He is alert and oriented.  Mild dysarthria with mild left facial weakness.  Able to follow simple motor commands without difficulty. Has poor insight and lacks awareness of deficits Motor: Left upper extremity: Shoulder abduction, elbow flexion/extension 4-/5, hand grip 3/5  Left lower extremity and 4+/5 proximal to distal Right upper/right lower extremity 5/5  proximal to distal  Skin: Skin is warm and dry. He is not diaphoretic.  Psychiatric: He has a normal mood and affect. His behavior is normal. Thought content normal   Assessment/Plan: 1. Functional deficits secondary to right corona radiata infarct which require 3+ hours per day of interdisciplinary therapy in a comprehensive inpatient rehab setting. Physiatrist is providing close team supervision and 24 hour management of active medical problems listed below. Physiatrist and rehab team continue to assess barriers to discharge/monitor patient progress toward functional and medical goals.  Function:  Bathing Bathing position      Bathing parts      Bathing assist        Upper Body Dressing/Undressing Upper body dressing                    Upper body assist        Lower Body Dressing/Undressing Lower body dressing                                  Lower body assist        Toileting Toileting Toileting activity did not occur: Refused        Toileting assist     Transfers Chair/bed Clinical biochemist  Cognition Comprehension Comprehension assist level: Understands complex 90% of the time/cues 10% of the time  Expression Expression assist level: Expresses complex ideas: With extra time/assistive device  Social Interaction Social Interaction assist level: Interacts appropriately 90% of the time - Needs monitoring or encouragement for participation or interaction.  Problem Solving Problem solving assist level: Solves complex 90% of the time/cues < 10% of the time  Memory Memory assist level: Recognizes or recalls 90% of the time/requires cueing < 10% of the time    Medical Problem List and Plan: 1. Left sided weakness, gait abnormalities secondary to right corona radiata infarct on 05/14/15.  Begin CIR 2. DVT Prophylaxis/Anticoagulation: Pharmaceutical: Lovenox 3. Pain Management:  N/A 4. Mood: LCSW to follow for evaluation and support.  5. Neuropsych: This patient is not fully capable of making decisions on his own behalf. 6. Skin/Wound Care: Routine pressure relief measures. Maintain adequate nutritional and hydration status 7. Fluids/Electrolytes/Nutrition: Monitor I/O.   BMP within acceptable range at 5/11 8. T2DM: Monitor BS ac/hs. Resumed metformin 500 mg bid and trajenta 5 mg daily.   Will monitor with increased activity and make adjustments as necessary 9. Dyslipidemia: on Lipitor daily.  10. HTN: Monitor BP.  Hold lisinopril to allow for adequate perfusion. Resume lisinopril if BP trends upwards.  11. Leukocytosis:  WBCs 11.8 on 5/11  U culture ordered 5/11  LOS (Days) 1 A FACE TO FACE EVALUATION WAS PERFORMED  Wisam Siefring Lorie Phenix 05/18/2015 9:31 AM

## 2015-05-18 NOTE — Evaluation (Signed)
Occupational Therapy Assessment and Plan and OT Intervention  Patient Details  Name: Billy Torres MRN: [id number] Date of Birth: 1955/12/09  OT Diagnosis: cognitive deficits, hemiplegia affecting non-dominant side and coordination disorder, ataxia Rehab Potential: Rehab Potential (ACUTE ONLY): Fair ELOS: 10 days   Today's Date: 05/18/2015 OT Individual Time:  0092- 1000     74 minutes of skilled intervention  Problem List:  Patient Active Problem List   Diagnosis Date Noted  . Leukocytosis   . Acute thrombotic stroke ([place]) 05/17/2015  . Diabetes mellitus due to underlying condition with complication, without long-term current use of insulin ([place])   . Gait disturbance, post-stroke   . Hemiparesis ([place])   . Cognitive deficit due to old cerebral infarction   . Benign essential HTN   . Diabetes mellitus type 2 in nonobese (HCC)   . Essential hypertension   . History of CVA (cerebrovascular accident) without residual deficits   . Ataxia, post-stroke   . Dysarthria, post-stroke   . Tobacco abuse   . HLD (hyperlipidemia)   . Acute CVA (cerebrovascular accident) ([place]) 05/15/2015  . Left-sided weakness 05/14/2015  . Right shoulder pain 03/03/2014  . History of CVA (cerebrovascular accident) 02/28/2014  . Smoking 06/29/2013  . CVA (cerebral infarction) 06/29/2013  . Dyslipidemia 06/29/2013  . TIA (transient ischemic attack) 06/27/2013  . Diabetes ([place]) 06/27/2013    Past Medical History:  Past Medical History  Diagnosis Date  . Diabetes mellitus without complication ([place])   . Hypertension   . Hyperlipidemia    Past Surgical History:  Past Surgical History  Procedure Laterality Date  . Neck surgery      Assessment & Plan Clinical Impression: Patient is a 60 y.o. year old male with history of T2DM, HTN, L-MCA infarct 2015 without residual deficits who had been off plavix/ASA X 1 month. He was admitted on 05/14/15 with mild left sided weakness X 2 days with multiple  falls MRI/MRA brain showed small acute non-hemorrhagic infarct posterior right corona radiata to posterior limb of internal capsule, global atropy and remote/chronic small vessel disease. 2D echo with EF 65-70% without abnormality. Carotid dopplers without ICA stenosis. He did have worsening of symptoms with confusion after admission and follow up MRI showed minimal enlargement. Dr. Leonie Man recommended resuming ASA 81 mg and plavix for stroke felt to be due to small vessel disease. Patient with resultant left sided weakness with ataxia, problems with processing with cognitive deficits and balance deficits. Therapy evaluation done yesterday and CIR recommended for follow up therapy. Patient transferred to CIR on 05/17/2015 .    Patient currently requires min-mod A with basic self-care skills secondary to muscle weakness, decreased cardiorespiratoy endurance, ataxia and decreased coordination, decreased safety awareness and decreased memory and decreased sitting balance, decreased standing balance, hemiplegia and decreased balance strategies.  Prior to hospitalization, patient could complete ADLs with independent .  Patient will benefit from skilled intervention to increase independence with basic self-care skills prior to discharge home with care partner.  Anticipate patient will require 24 hour supervision and follow up home health.  OT - End of Session Activity Tolerance: Decreased this session Endurance Deficit: Yes Endurance Deficit Description: multiple rest breaks secondary to fatigue OT Assessment Rehab Potential (ACUTE ONLY): Fair Barriers to Discharge: Decreased caregiver support Barriers to Discharge Comments: son works mon - thrusday during the day OT Patient demonstrates impairments in the following area(s): Balance;Cognition;Endurance;Motor;Safety OT Basic ADL's Functional Problem(s): Grooming;Bathing;Dressing;Toileting;Eating OT Advanced ADL's Functional Problem(s):  (n/a) OT Transfers  Functional  Problem(s): Toilet;Tub/Shower OT Additional Impairment(s): Fuctional Use of Upper Extremity OT Plan OT Intensity: Minimum of 1-2 x/day, 45 to 90 minutes OT Frequency: 5 out of 7 days OT Duration/Estimated Length of Stay: 10 days OT Treatment/Interventions: Balance/vestibular training;DME/adaptive equipment instruction;Patient/family education;Therapeutic Activities;Therapeutic Exercise;Psychosocial support;Cognitive remediation/compensation;Community reintegration;Functional mobility training;Self Care/advanced ADL retraining;UE/LE Strength taining/ROM;UE/LE Coordination activities;Discharge planning;Neuromuscular re-education OT Self Feeding Anticipated Outcome(s): set up A OT Basic Self-Care Anticipated Outcome(s): supervision overall OT Toileting Anticipated Outcome(s): supervision overall OT Bathroom Transfers Anticipated Outcome(s): supervision overall OT Recommendation Recommendations for Other Services: Neuropsych consult Patient destination: Home Follow Up Recommendations: Home health OT;24 hour supervision/assistance Equipment Recommended: To be determined   Skilled Therapeutic Intervention Upon entering the room, pt supine in bed with no c/o pain this session. Pt is soaked in urine in bed and unaware. Pt performed supine >sit with steady assist to EOB. Pt performed 10' ambulation with RW into bathroom for steady assist onto TTB. Pt bathing for TTB with encouraged use of L UE. Pt unable to grasp wash cloth and would likely benefit from bath mit. Pt required hand over hand assistance to wash R UE and R LE with L hand. Pt stood in shower with steady assist and use of grab bar to wash buttocks. Pt donning gown while seated in recliner chair and donned B socks with one handed technique. Pt remaining in wheelchair with RN present in the room. Call bell and all needed items within reach upon exiting the room.   OT Evaluation Precautions/Restrictions  Precautions Precautions:  Fall Precaution Comments: L hemiparesis, L lean in sitting and with ambulation Restrictions Weight Bearing Restrictions: No Pain Pain Assessment Pain Assessment: No/denies pain Pain Score: 3  Home Living/Prior Functioning Home Living Available Help at Discharge: Family, Available PRN/intermittently Type of Home: House Home Access: Stairs to enter Technical brewer of Steps: 2 Entrance Stairs-Rails: None Home Layout: One level Bathroom Shower/Tub: Chiropodist: Standard Bathroom Accessibility: Yes  Lives With: Son, Other (Comment) (son's girlfriend) Prior Function Level of Independence: Independent with gait, Independent with transfers, Independent with basic ADLs  Able to Take Stairs?: Yes Driving: No Comments: sedentary lifestyle Vision/Perception  Vision- History Baseline Vision/History: Wears glasses Wears Glasses: At all times Patient Visual Report: No change from baseline Vision- Assessment Vision Assessment?: Yes;No apparent visual deficits  Cognition Overall Cognitive Status: Impaired/Different from baseline Arousal/Alertness: Awake/alert Orientation Level: Person;Place;Situation Person: Oriented Place: Oriented Situation: Oriented Year: 2017 Month: May Day of Week: Other (Comment) (tuesday) Memory: Impaired Memory Impairment: Retrieval deficit;Decreased recall of new information;Decreased short term memory Immediate Memory Recall: Sock;Blue;Bed Memory Recall: Blue;Bed (unable to name sock with cue) Memory Recall Blue: Without Cue Memory Recall Bed: Without Cue Attention: Sustained Sustained Attention: Appears intact Selective Attention: Impaired Selective Attention Impairment: Verbal basic;Functional basic Awareness: Impaired Awareness Impairment: Emergent impairment Problem Solving: Impaired Problem Solving Impairment: Functional basic Safety/Judgment: Impaired Sensation Sensation Light Touch: Appears Intact Stereognosis:  Not tested Hot/Cold: Not tested Coordination Gross Motor Movements are Fluid and Coordinated: No Fine Motor Movements are Fluid and Coordinated: No Motor  Motor Motor: Other (comment);Ataxia Motor - Skilled Clinical Observations: L hemiparesis UE>LE, L lateral lean in standing/ambulation Mobility  Transfers Sit to Stand: 4: Min assist Sit to Stand Details: Verbal cues for sequencing;Verbal cues for technique;Verbal cues for safe use of DME/AE;Verbal cues for precautions/safety Stand to Sit: 4: Min assist Stand to Sit Details (indicate cue type and reason): Verbal cues for sequencing;Verbal cues for technique;Verbal cues for safe use of DME/AE;Verbal cues for precautions/safety  Trunk/Postural Assessment  Cervical Assessment Cervical Assessment: Within Functional Limits Thoracic Assessment Thoracic Assessment: Within Functional Limits Lumbar Assessment Lumbar Assessment: Within Functional Limits Postural Control Postural Control: Deficits on evaluation  Balance Static Sitting Balance Static Sitting - Balance Support: No upper extremity supported;Feet unsupported Static Sitting - Level of Assistance: 5: Stand by assistance Dynamic Sitting Balance Dynamic Sitting - Balance Support: Left upper extremity supported;Right upper extremity supported;During functional activity Dynamic Sitting - Level of Assistance: 5: Stand by assistance Dynamic Standing Balance Dynamic Standing - Balance Support: Bilateral upper extremity supported;During functional activity Dynamic Standing - Level of Assistance: 4: Min assist;3: Mod assist Extremity/Trunk Assessment RUE Assessment RUE Assessment: Within Functional Limits LUE Assessment LUE Assessment: Exceptions to [facility] (2-/5 gross strength throughout)   See Function Navigator for Current Functional Status.   Refer to Care Plan for Long Term Goals  Recommendations for other services: Neuropsych  Discharge Criteria: Patient will be discharged  from OT if patient refuses treatment 3 consecutive times without medical reason, if treatment goals not met, if there is a change in medical status, if patient makes no progress towards goals or if patient is discharged from hospital.  The above assessment, treatment plan, treatment alternatives and goals were discussed and mutually agreed upon: by patient  Phineas Semen 05/18/2015, 10:34 PM

## 2015-05-18 NOTE — IPOC Note (Signed)
Overall Plan of Care [facility]) Patient Details Name: Billy Torres MRN: [id number] DOB: 25-Mar-1965  Admitting Diagnosis: CVA  Hospital Problems: Active Problems:   Acute thrombotic stroke ([place])   Gait disturbance, post-stroke   Hemiparesis ([place])   Cognitive deficit due to old cerebral infarction   Benign essential HTN   Leukocytosis     Functional Problem List: Nursing Edema, Endurance, Nutrition, Pain, Safety, Motor  PT Balance, Endurance, Motor, Safety, Perception  OT Balance, Cognition, Endurance, Motor, Safety  SLP    TR         Basic ADL's: OT Grooming, Bathing, Dressing, Toileting, Eating     Advanced  ADL's: OT  (n/a)     Transfers: PT Bed Mobility, Bed to Chair, Car, Manufacturing systems engineer, Metallurgist: PT Stairs, Ambulation     Additional Impairments: OT Fuctional Use of Upper Extremity  SLP Social Cognition   Problem Solving, Memory, Attention  TR      Anticipated Outcomes Item Anticipated Outcome  Self Feeding set up A  Swallowing      Basic self-care  supervision overall  Toileting  supervision overall   Bathroom Transfers supervision overall  Bowel/Bladder  patient will be continent of bowel and bladder with min assist  Transfers  supervision  Locomotion  supervision ambulatory, min assist on stairs  Communication     Cognition  Supervision   Pain  pain will be less than or equal to 4/10  Safety/Judgment  patient will be free from falls/injury and display sound safety judgement   Therapy Plan: PT Intensity: Minimum of 1-2 x/day ,45 to 90 minutes PT Frequency: 5 out of 7 days PT Duration Estimated Length of Stay: 10-12 days OT Intensity: Minimum of 1-2 x/day, 45 to 90 minutes OT Frequency: 5 out of 7 days OT Duration/Estimated Length of Stay: 10 days SLP Intensity: Minumum of 1-2 x/day, 30 to 90 minutes SLP Frequency: 3 to 5 out of 7 days SLP Duration/Estimated Length of Stay: 10-12 days        Team  Interventions: Nursing Interventions Patient/Family Education, Disease Management/Prevention, Pain Management, Medication Management, Cognitive Remediation/Compensation, Discharge Planning  PT interventions Ambulation/gait training, Community reintegration, DME/adaptive equipment instruction, Neuromuscular re-education, Psychosocial support, Stair training, UE/LE Strength taining/ROM, UE/LE Coordination activities, Therapeutic Activities, Discharge planning, Training and development officer, Cognitive remediation/compensation, Functional mobility training, Patient/family education, Splinting/orthotics, Therapeutic Exercise  OT Interventions Training and development officer, DME/adaptive equipment instruction, Patient/family education, Therapeutic Activities, Therapeutic Exercise, Psychosocial support, Cognitive remediation/compensation, Community reintegration, Functional mobility training, Self Care/advanced ADL retraining, UE/LE Strength taining/ROM, UE/LE Coordination activities, Discharge planning, Neuromuscular re-education  SLP Interventions Cognitive remediation/compensation, Cueing hierarchy, Functional tasks, Patient/family education, Therapeutic Activities, Environmental controls, Internal/external aids  TR Interventions    SW/CM Interventions Discharge Planning, Psychosocial Support, Patient/Family Education    Team Discharge Planning: Destination: PT-Home ,OT- Home , SLP-Home Projected Follow-up: PT-Home health PT, 24 hour supervision/assistance, OT-  Home health OT, 24 hour supervision/assistance, SLP- (TBD) Projected Equipment Needs: PT-To be determined, OT- To be determined, SLP-None recommended by SLP Equipment Details: PT-RW with L hand splint versus wide based quad cane, OT-  Patient/family involved in discharge planning: PT- Patient,  OT-Patient, SLP-Patient  MD ELOS: 9-12 days. Medical Rehab Prognosis:  Good Assessment: 60 y.o. RH-male with history of T2DM, HTN, L-MCA infarct 2015 without  residual deficits who had been off plavix/ASA X 1 month. He was admitted on 05/14/15 with mild left sided weakness X 2 days with multiple falls MRI/MRA brain showed small acute non-hemorrhagic  infarct posterior right corona radiata to posterior limb of internal capsule, global atropy and remote/chronic small vessel disease. 2D echo with EF 65-70% without abnormality. Carotid dopplers without ICA stenosis. He did have worsening of symptoms with confusion after admission and follow up MRI showed minimal enlargement. Dr. Leonie Man recommended resuming ASA 81 mg and plavix for stroke felt to be due to small vessel disease. Patient with resultant left sided weakness with ataxia, problems with processing with cognitive deficits and balance deficits.Patient with functional deficits of weakness and gait abnormality as result. Will set goals for supervision with PT and supervision with OT and supervision with SLP.   See Team Conference Notes for weekly updates to the plan of care

## 2015-05-18 NOTE — Evaluation (Signed)
Speech Language Pathology Assessment and Plan  Patient Details  Name: Billy Torres MRN: [id number] Date of Birth: 01/25/1955  SLP Diagnosis: Cognitive Impairments  Rehab Potential: Excellent ELOS: 10-12 days     Today's Date: 05/18/2015 SLP Individual Time: 1425-1520 SLP Individual Time Calculation (min): 55 min   Problem List:  Patient Active Problem List   Diagnosis Date Noted  . Leukocytosis   . Acute thrombotic stroke ([place]) 05/17/2015  . Diabetes mellitus due to underlying condition with complication, without long-term current use of insulin ([place])   . Gait disturbance, post-stroke   . Hemiparesis ([place])   . Cognitive deficit due to old cerebral infarction   . Benign essential HTN   . Diabetes mellitus type 2 in nonobese (HCC)   . Essential hypertension   . History of CVA (cerebrovascular accident) without residual deficits   . Ataxia, post-stroke   . Dysarthria, post-stroke   . Tobacco abuse   . HLD (hyperlipidemia)   . Acute CVA (cerebrovascular accident) ([place]) 05/15/2015  . Left-sided weakness 05/14/2015  . Right shoulder pain 03/03/2014  . History of CVA (cerebrovascular accident) 02/28/2014  . Smoking 06/29/2013  . CVA (cerebral infarction) 06/29/2013  . Dyslipidemia 06/29/2013  . TIA (transient ischemic attack) 06/27/2013  . Diabetes ([place]) 06/27/2013   Past Medical History:  Past Medical History  Diagnosis Date  . Diabetes mellitus without complication ([place])   . Hypertension   . Hyperlipidemia    Past Surgical History:  Past Surgical History  Procedure Laterality Date  . Neck surgery      Assessment / Plan / Recommendation Clinical Impression 60 y.o. RH-male with history of T2DM, HTN, L-MCA infarct 2015 without residual deficits who had been off plavix/ASA X 1 month. He was admitted on 05/14/15 with mild left sided weakness X 2 days with multiple falls MRI/MRA brain showed small acute non-hemorrhagic infarct posterior right corona radiata to  posterior limb of internal capsule, global atropy and remote/chronic small vessel disease. 2D echo with EF 65-70% without abnormality. Carotid dopplers without ICA stenosis. He did have worsening of symptoms with confusion after admission and follow up MRI showed minimal enlargement. ASA and plavix resumed and Dr. Leonie Man felt that stroke was due to small vessel disease. Patient with resultant left sided weakness with ataxia, problems with processing with cognitive deficits and balance deficits. Therapy evaluation done yesterday and CIR recommended for follow up therapy. Patient transferred to CIR on 05/17/2015.   Patient demonstrates mild cognitive impairments impacting attention, problem solving, recall and emergent awareness which impacts his ability to complete functional and familiar tasks safely. Patient would benefit from skilled SLP intervention to maximize his cognitive function and overall functional independence prior to discharge.    Skilled Therapeutic Interventions          Administered a cognitive-linguistic evaluation. Please see above for details. Educated the patient in regards to his current cognitive impairments and goals of skilled SLP intervention, he verbalized understanding.   SLP Assessment  Patient will need skilled [facility] Services during CIR admission    Recommendations  Oral Care Recommendations: Oral care BID Recommendations for Other Services: Neuropsych consult Patient destination: Home Follow up Recommendations:  (TBD) Equipment Recommended: None recommended by SLP    SLP Frequency 3 to 5 out of 7 days   SLP Duration  SLP Intensity  SLP Treatment/Interventions 10-12 days   Minumum of 1-2 x/day, 30 to 90 minutes  Cognitive remediation/compensation;Cueing hierarchy;Functional tasks;Patient/family education;Therapeutic Activities;Environmental controls;Internal/external aids    Pain  No/Denies Pain     Function:  Cognition Comprehension Comprehension assist level: Understands basic 75 - 89% of the time/ requires cueing 10 - 24% of the time  Expression   Expression assist level: Expresses basic 75 - 89% of the time/requires cueing 10 - 24% of the time. Needs helper to occlude trach/needs to repeat words.  Social Interaction Social Interaction assist level: Interacts appropriately 75 - 89% of the time - Needs redirection for appropriate language or to initiate interaction.  Problem Solving Problem solving assist level: Solves basic 90% of the time/requires cueing < 10% of the time  Memory Memory assist level: Recognizes or recalls 75 - 89% of the time/requires cueing 10 - 24% of the time   Short Term Goals: Week 1: SLP Short Term Goal 1 (Week 1): Patient will recall new, daily information with supervision verbal cues.  SLP Short Term Goal 2 (Week 1): Patient will demonstrate complex problem solving for mildly complex and familiar tasks with supervision verbal and question cues.  SLP Short Term Goal 3 (Week 1): Patient will demonstrate alternating attention between two tasks  for 10 minutes with supervision verbal cues for redirection.  SLP Short Term Goal 4 (Week 1): Patient will self-monitor and correct errors with functional tasks with supervision question cues.   Refer to Care Plan for Long Term Goals  Recommendations for other services: Neuropsych  Discharge Criteria: Patient will be discharged from SLP if patient refuses treatment 3 consecutive times without medical reason, if treatment goals not met, if there is a change in medical status, if patient makes no progress towards goals or if patient is discharged from hospital.  The above assessment, treatment plan, treatment alternatives and goals were discussed and mutually agreed upon: by patient  Billy Torres 05/18/2015, 3:41 PM

## 2015-05-18 NOTE — Evaluation (Signed)
Physical Therapy Assessment and Plan  Patient Details  Name: Billy Torres MRN: [id number] Date of Birth: 15-May-1955  PT Diagnosis: Abnormality of gait, Coordination disorder, Hemiparesis non-dominant, Impaired cognition and Muscle weakness Rehab Potential: Good ELOS: 10-12 days   Today's Date: 05/18/2015 PT Individual Time: 1000-1100 PT Individual Time Calculation (min): 60 min    Problem List:  Patient Active Problem List   Diagnosis Date Noted  . Leukocytosis   . Acute thrombotic stroke ([place]) 05/17/2015  . Diabetes mellitus due to underlying condition with complication, without long-term current use of insulin ([place])   . Gait disturbance, post-stroke   . Hemiparesis ([place])   . Cognitive deficit due to old cerebral infarction   . Benign essential HTN   . Diabetes mellitus type 2 in nonobese (HCC)   . Essential hypertension   . History of CVA (cerebrovascular accident) without residual deficits   . Ataxia, post-stroke   . Dysarthria, post-stroke   . Tobacco abuse   . HLD (hyperlipidemia)   . Acute CVA (cerebrovascular accident) ([place]) 05/15/2015  . Left-sided weakness 05/14/2015  . Right shoulder pain 03/03/2014  . History of CVA (cerebrovascular accident) 02/28/2014  . Smoking 06/29/2013  . CVA (cerebral infarction) 06/29/2013  . Dyslipidemia 06/29/2013  . TIA (transient ischemic attack) 06/27/2013  . Diabetes ([place]) 06/27/2013    Past Medical History:  Past Medical History  Diagnosis Date  . Diabetes mellitus without complication ([place])   . Hypertension   . Hyperlipidemia    Past Surgical History:  Past Surgical History  Procedure Laterality Date  . Neck surgery      Assessment & Plan Clinical Impression:  A 60 y.o. RH-male with history of T2DM, HTN, L-MCA infarct 2015 without residual deficits who had been off plavix/ASA X 1 month. He was admitted on 05/14/15 with mild left sided weakness X 2 days with multiple falls MRI/MRA brain showed small acute  non-hemorrhagic infarct posterior right corona radiata to posterior limb of internal capsule, global atropy and remote/chronic small vessel disease. 2D echo with EF 65-70% without abnormality. Carotid dopplers without ICA stenosis. He did have worsening of symptoms with confusion after admission and follow up MRI showed minimal enlargement.  ASA and plavix resumed and Dr. Leonie Man felt that stroke was due to small vessel disease.  Patient with resultant left sided weakness with ataxia, problems with processing with cognitive deficits and balance deficits.   Therapy evaluation done yesterday and CIR recommended for follow up therapy.  Patient transferred to CIR on 05/17/2015 .   Patient currently requires mod with mobility secondary to muscle weakness, impaired timing and sequencing, unbalanced muscle activation, ataxia and decreased coordination, decreased attention, decreased awareness, decreased problem solving and decreased safety awareness and decreased sitting balance, decreased standing balance, decreased postural control and decreased balance strategies.  Prior to hospitalization, patient was independent  with mobility and lived with Son, Other (Comment) (son's girlfiend) in a House home.  Home access is 2Stairs to enter.  Patient will benefit from skilled PT intervention to maximize safe functional mobility, minimize fall risk and decrease caregiver burden for planned discharge home with 24 hour supervision.  Anticipate patient will benefit from follow up [place] at discharge.  PT - End of Session Activity Tolerance: Tolerates 30+ min activity with multiple rests Endurance Deficit: Yes PT Assessment Rehab Potential (ACUTE/IP ONLY): Good Barriers to Discharge: [place] home environment;Decreased caregiver support Barriers to Discharge Comments: home alone ~8 hrs/day, steps to enter with no rail PT Patient demonstrates impairments in  the following area(s): Balance;Endurance;Motor;Safety;Perception PT  Transfers Functional Problem(s): Bed Mobility;Bed to Chair;Car;Furniture PT Locomotion Functional Problem(s): Stairs;Ambulation PT Plan PT Intensity: Minimum of 1-2 x/day ,45 to 90 minutes PT Frequency: 5 out of 7 days PT Duration Estimated Length of Stay: 10-12 days PT Treatment/Interventions: Ambulation/gait training;Community reintegration;DME/adaptive equipment instruction;Neuromuscular re-education;Psychosocial support;Stair training;UE/LE Strength taining/ROM;UE/LE Coordination activities;Therapeutic Activities;Discharge planning;Balance/vestibular training;Cognitive remediation/compensation;Functional mobility training;Patient/family education;Splinting/orthotics;Therapeutic Exercise PT Transfers Anticipated Outcome(s): supervision PT Locomotion Anticipated Outcome(s): supervision ambulatory, min assist on stairs PT Recommendation Follow Up Recommendations: Home health PT;24 hour supervision/assistance Patient destination: Home Equipment Recommended: To be determined Equipment Details: RW with L hand splint versus wide based quad cane  Skilled Therapeutic Intervention Pt received resting in recliner, no c/o pain and agreeable to therapy session.  Skilled intervention follow completion of initial evaluation.  Pt transfers sit<>stand throughout session with steady assist but requires mod assist for stand/pivot or ambulatory transfers 2/2 L lateral lean and L inattention.  PT instructed pt in gait with RW x150', RW with L handsplint x150' and wide based quad cane 2x20' all with heavy min>mod assist.  Pt demos decreased L attention during gait with decreased R weight shift and decreased L step length.  Pt with L lean in static stance, worse during ambulation.  PT instructed pt in stair negotiation forward ascent/descent with min assist to ascend and mod assist to descend 2/2 increased L lateral lean.  PT instructed pt in car transfer at simulated sedan height with mod assist 2/2 decreased  attention to task.  Pt reports his son drives a cadillac escalade which is "very high" but states his son's girlfriend may be able to pick him up from the hospital in her honda.  Will continue to practice for either situation.  Pt returned to room at end of session and taken to restroom with NT, mod assist for toilet transfer 2/2 decreased attention to task and delayed processing.  Handoff to NT in restroom.   PT Evaluation Precautions/Restrictions Precautions Precautions: Fall Precaution Comments: L hemiparesis, L lean in sitting and with ambulation Restrictions Weight Bearing Restrictions: No General   Vital Signs  Pain Pain Assessment Pain Assessment: No/denies pain Home Living/Prior Functioning Home Living Available Help at Discharge: Family;Available PRN/intermittently (pt home alone 8 hrs/day) Type of Home: House Home Access: Stairs to enter [employer] of Steps: 2 Entrance Stairs-Rails: None Home Layout: One level  Lives With: Son;Other (Comment) (son's girlfiend) Prior Function Level of Independence: Independent with gait;Independent with transfers  Able to Take Stairs?: Yes Driving: No Comments: sedentary lifestyle Vision/Perception  Vision - Assessment Eye Alignment: Within Functional Limits Ocular Range of Motion: Within Functional Limits Alignment/Gaze Preference: Within Defined Limits Tracking/Visual Pursuits: Able to track stimulus in all quads without difficulty Saccades: Within functional limits Convergence: Within functional limits Perception Comments: perception appears functional at eval, to be further tested throughout CIR stay  Cognition Overall Cognitive Status: Impaired/Different from baseline Arousal/Alertness: Awake/alert Orientation Level: Oriented X4 Attention: Sustained Sustained Attention: Appears intact Awareness: Impaired Awareness Impairment: Emergent impairment;Anticipatory impairment Problem Solving: Impaired Problem Solving  Impairment: Functional basic Safety/Judgment: Impaired Sensation Sensation Light Touch: Appears Intact (pt report R=L for sensation) Coordination Gross Motor Movements are Fluid and Coordinated: No Fine Motor Movements are Fluid and Coordinated: No (finger opposition: decreased speed and accuracy on R, unable to perform on L 2/2 weakness) Finger Nose Finger Test: intact on R, unable to perform on L 2/2 hemiparesis Heel Shin Test: decreased speed bilaterally, L>R Motor  Motor Motor: Other (comment);Ataxia Motor -  Skilled Clinical Observations: L hemiparesis UE>LE, L lateral lean in standing/ambulation  Mobility Transfers Transfers: Yes Sit to Stand: 4: Min assist Sit to Stand Details: Verbal cues for sequencing;Verbal cues for technique;Verbal cues for safe use of DME/AE;Verbal cues for precautions/safety Stand to Sit: 4: Min assist Stand to Sit Details (indicate cue type and reason): Verbal cues for sequencing;Verbal cues for technique;Verbal cues for safe use of DME/AE;Verbal cues for precautions/safety Locomotion  Ambulation Ambulation: Yes Ambulation/Gait Assistance: 4: Min assist;3: Mod assist Ambulation Distance (Feet): 150 Feet Assistive device: Rolling walker;Large base quad cane Ambulation/Gait Assistance Details: Verbal cues for sequencing;Verbal cues for technique;Verbal cues for gait pattern;Verbal cues for safe use of DME/AE;Manual facilitation for weight shifting Ambulation/Gait Assistance Details: min assist for short distances with RW, mod assist with longer distances with RW and with quad cane Gait Gait: Yes Gait Pattern: Impaired Gait Pattern: Step-to pattern;Decreased step length - left;Decreased weight shift to right;Decreased stance time - right;Poor foot clearance - left Gait velocity: slow High Level Ambulation High Level Ambulation: Side stepping Side Stepping: mod assist Stairs / Additional Locomotion Stairs: Yes Stairs Assistance: 4: Min assist;3: Mod  assist Stairs Assistance Details: Verbal cues for sequencing;Verbal cues for technique;Verbal cues for gait pattern;Verbal cues for safe use of DME/AE;Verbal cues for precautions/safety;Manual facilitation for weight shifting Stairs Assistance Details (indicate cue type and reason): min assist to ascend/descend 3" steps, min assist to ascend and mod assist to descend 6" steps Stair Management Technique: Two rails Number of Stairs: 12 Wheelchair Mobility Wheelchair Mobility: No  Trunk/Postural Assessment  Cervical Assessment Cervical Assessment: Within Functional Limits Thoracic Assessment Thoracic Assessment: Within Functional Limits Lumbar Assessment Lumbar Assessment: Within Functional Limits Postural Control Postural Control: Deficits on evaluation Righting Reactions: delayed Protective Responses: delayed  Balance Static Sitting Balance Static Sitting - Balance Support: No upper extremity supported;Feet unsupported Static Sitting - Level of Assistance: 5: Stand by assistance Dynamic Sitting Balance Dynamic Sitting - Balance Support: Left upper extremity supported;Right upper extremity supported;During functional activity Dynamic Sitting - Level of Assistance: 5: Stand by assistance Static Standing Balance Static Standing - Balance Support: Right upper extremity supported;Left upper extremity supported;During functional activity Static Standing - Level of Assistance: 4: Min assist Dynamic Standing Balance Dynamic Standing - Balance Support: Bilateral upper extremity supported;During functional activity Dynamic Standing - Level of Assistance: 4: Min assist;3: Mod assist Extremity Assessment      RLE Assessment RLE Assessment: Within Functional Limits RLE AROM (degrees) RLE Overall AROM Comments: WFL assessed in sitting RLE Strength Right Hip Flexion: 4-/5 Right Knee Flexion: 5/5 Right Knee Extension: 5/5 Right Ankle Dorsiflexion: 5/5 Right Ankle Plantar Flexion: 5/5 LLE  Assessment LLE Assessment: Within Functional Limits LLE AROM (degrees) LLE Overall AROM Comments: WFL assessed in sitting LLE Strength Left Hip Flexion: 4+/5 Left Knee Flexion: 5/5 Left Knee Extension: 4+/5 Left Ankle Dorsiflexion: 5/5 Left Ankle Plantar Flexion: 5/5   See Function Navigator for Current Functional Status.   Refer to Care Plan for Long Term Goals  Recommendations for other services: None  Discharge Criteria: Patient will be discharged from PT if patient refuses treatment 3 consecutive times without medical reason, if treatment goals not met, if there is a change in medical status, if patient makes no progress towards goals or if patient is discharged from hospital.  The above assessment, treatment plan, treatment alternatives and goals were discussed and mutually agreed upon: by patient  Urban Gibson E Penven-Crew 05/18/2015, 11:54 AM

## 2015-05-18 NOTE — Progress Notes (Signed)
Patient information reviewed and entered into eRehab system by Yazmen Briones, RN, CRRN, PPS Coordinator.  Information including medical coding and functional independence measure will be reviewed and updated through discharge.    

## 2015-05-19 ENCOUNTER — Inpatient Hospital Stay (HOSPITAL_COMMUNITY): Payer: Medicaid Other | Admitting: Speech Pathology

## 2015-05-19 ENCOUNTER — Inpatient Hospital Stay (HOSPITAL_COMMUNITY): Payer: Self-pay | Admitting: Physical Therapy

## 2015-05-19 ENCOUNTER — Inpatient Hospital Stay (HOSPITAL_COMMUNITY): Payer: Medicaid Other | Admitting: Occupational Therapy

## 2015-05-19 LAB — GLUCOSE, CAPILLARY
GLUCOSE-CAPILLARY: 89 mg/dL (ref 65–99)
Glucose-Capillary: 124 mg/dL — ABNORMAL HIGH (ref 65–99)
Glucose-Capillary: 129 mg/dL — ABNORMAL HIGH (ref 65–99)
Glucose-Capillary: 125 mg/dL — ABNORMAL HIGH (ref 65–99)

## 2015-05-19 LAB — URINE CULTURE

## 2015-05-19 NOTE — Progress Notes (Signed)
Speech Language Pathology Daily Session Note  Patient Details  Name: Billy Torres MRN: [id number] Date of Birth: [birth year]  Today's Date: 05/19/2015 SLP Individual Time: [id number] SLP Individual Time Calculation (min): 55 min  Short Term Goals: Week 1: SLP Short Term Goal 1 (Week 1): Patient will recall new, daily information with supervision verbal cues.  SLP Short Term Goal 2 (Week 1): Patient will demonstrate complex problem solving for mildly complex and familiar tasks with supervision verbal and question cues.  SLP Short Term Goal 3 (Week 1): Patient will demonstrate alternating attention between two tasks  for 10 minutes with supervision verbal cues for redirection.  SLP Short Term Goal 4 (Week 1): Patient will self-monitor and correct errors with functional tasks with supervision question cues.   Skilled Therapeutic Interventions: Skilled treatment session focused on cognitive goals. SLP facilitated session by providing supervision verbal cues for patient to self-monitor and correct errors during a mildly complex medication management task of organizing a 2 time per day pill box. Patient independently recalled 60% of his medications and required total A to recall remaining medications, suspect due to them being new since admission. Patient demonstrated selective attention to task in a mildly distracting environment for ~45 minutes with Mod I and recalled events from morning therapy sessions with supervision question cues. Patient left supine in bed with all needs within reach. Continue with current plan of care.      Function:  Cognition Comprehension Comprehension assist level: Understands basic 90% of the time/cues < 10% of the time  Expression   Expression assist level: Expresses basic 90% of the time/requires cueing < 10% of the time.  Social Interaction Social Interaction assist level: Interacts appropriately 90% of the time - Needs monitoring or encouragement for  participation or interaction.  Problem Solving Problem solving assist level: Solves complex 90% of the time/cues < 10% of the time  Memory Memory assist level: Recognizes or recalls 75 - 89% of the time/requires cueing 10 - 24% of the time    Pain No/Denies Pain   Therapy/Group: Individual Therapy  Kenyatte Chatmon, [place] 05/19/2015, 3:37 PM

## 2015-05-19 NOTE — Progress Notes (Signed)
Physical Therapy Session Note  Patient Details  Name: Billy Torres MRN: [id number] Date of Birth: 09/20/1955  Today's Date: 05/19/2015 PT Individual Time: [id number] PT Individual Time Calculation (min): 76 min   Short Term Goals: Week 1:  PT Short Term Goal 1 (Week 1): Pt will transfer with consistent min assist for ambulatory transfers PT Short Term Goal 2 (Week 1): Pt will ambulate with LRAD and min assist x150' PT Short Term Goal 3 (Week 1): Pt will demonstrate increased awareness of LLE as evidenced by consistent R=L step length during gait PT Short Term Goal 4 (Week 1): Pt will maintain dynamic standing balance with min assist  Skilled Therapeutic Interventions/Progress Updates:    Pt received resting in bed with no c/o pain and agreeable to therapy session.  Pt noted to be incontinent of urine, and assisted into bathroom.  Session focus on safety awareness, NMR, gait training, and transfers.    Supine>sit with supervision and increased time.  Pt performed sit<>stand throughout session with min>mod assist for forward weight shift and occasional lifting assist from lower surfaces.  Toileting with assist for clothing management and mod assist for transfer.    NMR at [place] x10 minutes for reciprocal stepping pattern, forced use of LUE, and overall endurance.  PT applied kinesiotape to LUE for edema control and proprioceptive feedback at digits, dorsum of hand, and dorsal forearm with decompression strip applied across dorsum of hand for subjective pain control.  Kinesiotape also applied to pt's LLE in helical pattern for improved proprioception during gait.    Gait training x70' + 150' with RW and min/mod assist with verbal cues for L step and increasing assist needed 2/2 progressive L lateral lean with fatigue.  Pt noted to require decreased cues for step length following kinesiotape application, but continues to require multimodal cues for sequencing of turns and midline orientation.     Therapy Documentation Precautions:  Precautions Precautions: Fall Precaution Comments: L hemiparesis, L lean in sitting and with ambulation Restrictions Weight Bearing Restrictions: No   See Function Navigator for Current Functional Status.   Therapy/Group: Individual Therapy  [name] 05/19/2015, 4:40 PM

## 2015-05-19 NOTE — Care Management Note (Signed)
Inpatient [facility] Individual Statement of Services  Patient Name:  Billy Torres  Date:  05/19/2015  Welcome to the [facility].  Our goal is to provide you with an individualized program based on your diagnosis and situation, designed to meet your specific needs.  With this comprehensive rehabilitation program, you will be expected to participate in at least 3 hours of rehabilitation therapies Monday-Friday, with modified therapy programming on the weekends.  Your rehabilitation program will include the following services:  Physical Therapy (PT), Occupational Therapy (OT), Speech Therapy (ST), 24 hour per day rehabilitation nursing, Therapeutic Recreaction (TR), Neuropsychology, Case Management (Social Worker), Rehabilitation Medicine, Nutrition Services and Pharmacy Services  Weekly team conferences will be held on Wednesdays to discuss your progress.  Your Social Worker will talk with you frequently to get your input and to update you on team discussions.  Team conferences with you and your family in attendance may also be held.  Expected length of stay: 10-12 days  Overall anticipated outcome: supervision  Depending on your progress and recovery, your program may change. Your Social Worker will coordinate services and will keep you informed of any changes. Your Social Worker's name and contact numbers are listed  below.  The following services may also be recommended but are not provided by the [facility] will be made to provide these services after discharge if needed.  Arrangements include referral to agencies that provide these services.  Your insurance has been verified to be:  None (Medicaid application in process) Your primary doctor is:  Sharon Seller, NP  Pertinent information will be shared with your  doctor and your insurance company.  Social Worker:  [place], [place] or ([phone]   Information discussed with and copy given to patient by: Lennart Pall, 05/19/2015, 10:03 AM

## 2015-05-19 NOTE — Progress Notes (Signed)
Social Work  Social Work Assessment and Plan  Patient Details  Name: Billy Torres MRN: [id number] Date of Birth: November 25, 1955  Today's Date: 05/19/2015  Problem List:  Patient Active Problem List   Diagnosis Date Noted  . Leukocytosis   . Acute thrombotic stroke ([place]) 05/17/2015  . Diabetes mellitus due to underlying condition with complication, without long-term current use of insulin ([place])   . Gait disturbance, post-stroke   . Hemiparesis ([place])   . Cognitive deficit due to old cerebral infarction   . Benign essential HTN   . Diabetes mellitus type 2 in nonobese ([facility])   . Essential hypertension   . History of CVA (cerebrovascular accident) without residual deficits   . Ataxia, post-stroke   . Dysarthria, post-stroke   . Tobacco abuse   . HLD (hyperlipidemia)   . Acute CVA (cerebrovascular accident) ([place]) 05/15/2015  . Left-sided weakness 05/14/2015  . Right shoulder pain 03/03/2014  . History of CVA (cerebrovascular accident) 02/28/2014  . Smoking 06/29/2013  . CVA (cerebral infarction) 06/29/2013  . Dyslipidemia 06/29/2013  . TIA (transient ischemic attack) 06/27/2013  . Diabetes ([place] [facility]) 06/27/2013   Past Medical History:  Past Medical History  Diagnosis Date  . Diabetes mellitus without complication ([place])   . Hypertension   . Hyperlipidemia    Past Surgical History:  Past Surgical History  Procedure Laterality Date  . Neck surgery     Social History:  reports that he has been smoking Cigarettes.  He has been smoking about 0.25 packs per day. He does not have any smokeless tobacco history on file. He reports that he does not drink alcohol or use illicit drugs.  Family / Support Systems Marital Status: Divorced How Long?: 2005 Patient Roles: Parent, Other (Comment) (good relationship with ex-wife) Spouse/Significant Other: ex-wife, Kaiven Neason @ (C) [phone] - both pt and ex-wife report good relationship and pt considers her to be primary contact Children:  son, Nori Riis (pt lives with) @ (C) [phone];  son, Larkin Ina ([place]) @ (C) [phone] Other Supports: son's fiance, Remington Anticipated Caregiver: son and son's fiance Ability/Limitations of Caregiver: Son works 6 am to 5 pm, his girlfriend gets home between 27 and 2:30 pm. Caregiver Availability: Intermittent Family Dynamics: Pt reports that he has a good relationship with ex-wife and sons as well as son's fiance.  Son, Nori Riis, present today and able to offer more information about how pt was functioning at home PTA. He appears very concerned about his father and has struggled to get him connected with community resources since pt moved in with him in January.  Social History Preferred language: English Religion: Holiness Cultural Background: NA Education: 11th grade Read: Yes Write: Yes Employment Status: Unemployed Date Retired/Disabled/Unemployed: pt reports he has not worked since Nov 2016;  had work on Advice worker".  Son reports that he has had many jobs since his first [employer] in 2015 but would lose them pretty quickly. Legal Hisotry/Current Legal Issues: None Guardian/Conservator: None. Per MD, pt is not yet fully capable of making decisions on his own behalf.  Defer to sons.   Abuse/Neglect Physical Abuse: Denies Verbal Abuse: Denies Sexual Abuse: Denies Exploitation of patient/patient's resources: Denies Self-Neglect: Denies  Emotional Status Pt's affect, behavior adn adjustment status: Pt pleasant, soft-spoken and with fairly flat affect.  He seems to brighten a little when we spoke of his home in [place] and his [place] heritage.  He seems to be able to complete assessment interview without difficulty and son confirms info he  has provided.  He denies any s/s of emotional distress or depression, however, feel that neuropsychology consult is warranted given son's report of memory and activity level decline since 1st CVA. Recent Psychosocial Issues: Pt suffered a CVA in  2015 - son and ex-wife report gradual loss of memory and functional ability since then which has led to loss of several jobs.   Pyschiatric History: No formal diagnosis.  Son questions some degree of possible depression.  Notes that his activity level has decline with him spending many days in same chair at home and staring at the tv. Substance Abuse History: None  Patient / Family Perceptions, Expectations & Goals Pt/Family understanding of illness & functional limitations: Pt with basic understanding that he suffered another CVA and of his functional limitations/ need for CIR.  Family also with general understanding, however, will need further education. Premorbid pt/family roles/activities: Pt was independent overall, however, family concerned with gradual decline in memory since first CVA.  Family denies pt doing anything unsafe at home and notes that he was very sedentary when alone.  Sons and ex-wife have been trying their best to provide support. Anticipated changes in roles/activities/participation: Anticipate supervision goals which would indicate a family member or someone needs to assume primary, 24/7 caregiver role. Pt/family expectations/goals: Pt and family hopeful he can regain enough independence and safety that he can manage at home alone for several hours each day.  Community Resources [employer]: Other (Comment) (Medicaid app pending;  [facility]) Premorbid Home Care/DME Agencies: None Transportation available at discharge: yes family when not at work Resource referrals recommended: Neuropsychology  Discharge Planning Living Arrangements: [place]: Children, Other relatives Type of Residence: Private residence Insurance Resources: Teacher, adult education Resources: Family Support Financial Screen Referred: Yes Living Expenses: Lives with family Money Management: Family Does the patient have any problems obtaining your  medications?: No (able to get through [facility] via [facility]) Home Management: pt and family share Patient/Family Preliminary Plans: Pt to return home with son, Nori Riis, and his fiance.  Intermittent assist only available. Barriers to Discharge: Family Support Social Work Anticipated Follow Up Needs: HH/OP, Other (comment) (possible Adult Day Care program?  PACE?) Expected length of stay: 10-12 days  Clinical Impression Pleasant, soft-spoken gentleman here following CVA (his 2nd).  Completes assessment interview without difficulty and son confirms information.  Fairly flat affect throughout.  Has been living with son, Nori Riis, since January and alone at home during the day when son and his fiance are working.  Primary concern will be whether this is a safe situation to continue.  Have spoken with pt and son about other options i.e. Adult Day Care, RHP.  Family seems to want to provide as much support as they are able but they must work.  Note decline in memory and activity level since 1st CVA in 2015, however, he had continued to try and work.  Will follow for d/c planning needs and support.    Aniyla Harling 05/19/2015, 12:18 PM

## 2015-05-19 NOTE — Progress Notes (Signed)
Pt. had large emesis earlier, unable to call for help, was noted during rounding. Compazine IM given. Verbalizes some relief after. Denies any discomfort at this time. Will continue to educate pt to call for help

## 2015-05-19 NOTE — Progress Notes (Signed)
[facility] PHYSICAL MEDICINE & REHABILITATION     PROGRESS NOTE  Subjective/Complaints:  Pt sitting up in his recliner this AM.  He is watching TV and does not have any complaints.  He believes therapies went well yesterday.    ROS: Denies CP, SOB, nausea, vomiting, diarrhea.  Objective: Vital Signs: Blood pressure 108/71, pulse 83, temperature 98 F (36.7 C), temperature source Oral, resp. rate 18, height 5\' 8"  (1.727 m), weight 65.318 kg (144 lb), SpO2 97 %. No results found.  Recent Labs  05/18/15 0444  WBC 11.8*  HGB 14.7  HCT 44.8  PLT 255    Recent Labs  05/18/15 0444  NA 139  K 4.0  CL 102  GLUCOSE 131*  BUN 14  CREATININE 1.14  CALCIUM 9.4   CBG (last 3)   Recent Labs  05/18/15 1702 05/18/15 2125 05/19/15 0552  GLUCAP 127* 97 124*    Wt Readings from Last 3 Encounters:  05/17/15 65.318 kg (144 lb)  05/14/15 67.586 kg (149 lb)  05/12/15 67.586 kg (149 lb)    Physical Exam:  BP 108/71 mmHg  Pulse 83  Temp(Src) 98 F (36.7 C) (Oral)  Resp 18  Ht 5\' 8"  (1.727 m)  Wt 65.318 kg (144 lb)  BMI 21.90 kg/m2  SpO2 97% Constitutional: He appears well-developed and well-nourished. No distress. Vital signs reviewed. HENT: Normocephalic and atraumatic.  Eyes: Conjunctivae and EOM are normal.  Respiratory: Effort normal and breath sounds normal. No stridor. No respiratory distress. He has no wheezes.  GI: Soft. Bowel sounds are normal. He exhibits no distension. There is no tenderness.  Musculoskeletal: He exhibits no edema or tenderness.  Neurological: He is alert and oriented.  Mild dysarthria with mild left facial weakness.  Able to follow simple motor commands without difficulty. Has poor insight and lacks awareness of deficits Motor: Left upper extremity: Shoulder abduction, elbow flexion/extension 3+/5, hand grip 2/5  Left lower extremity and 4+/5 proximal to distal Right upper/right lower extremity 5/5 proximal to distal  Skin: Skin is warm  and dry. He is not diaphoretic.  Psychiatric: He has a normal mood and affect. His behavior is normal. Thought content normal   Assessment/Plan: 1. Functional deficits secondary to right corona radiata infarct which require 3+ hours per day of interdisciplinary therapy in a comprehensive inpatient rehab setting. Physiatrist is providing close team supervision and 24 hour management of active medical problems listed below. Physiatrist and rehab team continue to assess barriers to discharge/monitor patient progress toward functional and medical goals.  Function:  Bathing Bathing position   Position: Shower  Bathing parts Body parts bathed by patient: Left arm, Chest, Abdomen, Front perineal area, Right upper leg, Left upper leg, Left lower leg Body parts bathed by helper: Right arm, Buttocks, Right lower leg, Back  Bathing assist Assist Level:  (mod a)      Upper Body Dressing/Undressing Upper body dressing   What is the patient wearing?: Hospital gown                Upper body assist Assist Level: Set up   Set up : To obtain clothing/put away  Lower Body Dressing/Undressing Lower body dressing   What is the patient wearing?: Hospital Gown, Non-skid slipper socks         Non-skid slipper socks- Performed by patient: Don/doff right sock, Don/doff left sock                    Lower body assist  Assist for lower body dressing: Touching or steadying assistance (Pt > 75%)      Toileting Toileting Toileting activity did not occur: No continent bowel/bladder event   Toileting steps completed by helper: Adjust clothing after toileting Toileting Assistive Devices: Grab bar or rail  Toileting assist     Transfers Chair/bed transfer   Chair/bed transfer method: Ambulatory Chair/bed transfer assist level: Touching or steadying assistance (Pt > 75%) Chair/bed transfer assistive device: Armrests, Medical sales representative     Max distance: 150 Assist level:  Moderate assist (Pt 50 - 74%)   Wheelchair          Cognition Comprehension Comprehension assist level: Understands basic 75 - 89% of the time/ requires cueing 10 - 24% of the time  Expression Expression assist level: Expresses basic 75 - 89% of the time/requires cueing 10 - 24% of the time. Needs helper to occlude trach/needs to repeat words.  Social Interaction Social Interaction assist level: Interacts appropriately 75 - 89% of the time - Needs redirection for appropriate language or to initiate interaction.  Problem Solving Problem solving assist level: Solves basic 90% of the time/requires cueing < 10% of the time  Memory Memory assist level: Recognizes or recalls 75 - 89% of the time/requires cueing 10 - 24% of the time    Medical Problem List and Plan: 1. Left sided weakness, gait abnormalities secondary to right corona radiata infarct on 05/14/15.  Cont CIR 2. DVT Prophylaxis/Anticoagulation: Pharmaceutical: Lovenox 3. Pain Management: N/A 4. Mood: LCSW to follow for evaluation and support.  5. Neuropsych: This patient is not fully capable of making decisions on his own behalf. 6. Skin/Wound Care: Routine pressure relief measures. Maintain adequate nutritional and hydration status 7. Fluids/Electrolytes/Nutrition: Monitor I/O.   BMP within acceptable range at 5/11 8. T2DM: Monitor BS ac/hs. Resumed metformin 500 mg bid and trajenta 5 mg daily.  Fairly well controlled overall  Will monitor with increased activity and make adjustments as necessary 9. Dyslipidemia: on Lipitor daily.  10. HTN: Monitor BP.  Hold lisinopril to allow for adequate perfusion. Resume lisinopril if BP trends upwards.  11. Leukocytosis:  WBCs 11.8 on 5/11  UA negative, Uculture pending 5/11  LOS (Days) 2 A FACE TO FACE EVALUATION WAS PERFORMED  Bliss Tsang Lorie Phenix 05/19/2015 9:05 AM

## 2015-05-19 NOTE — Progress Notes (Signed)
Occupational Therapy Session Note  Patient Details  Name: CHANCIE PENNYWELL MRN: [id number] Date of Birth: 05/26/1955  Today's Date: 05/19/2015 OT Individual Time:  -   0900-1000  (60 min)      Short Term Goals: Week 1:  OT Short Term Goal 1 (Week 1): STGs=LTGs secondary to short estimated LOS  Skilled Therapeutic Interventions/Progress Updates:    cognitive deficits, hemiplegia affecting non-dominant side and coordination disorder, ataxia.   Ppt sitting in recliner upon OT arrival  Ambulated to sink per pt's choice as opposed to shower).   He stood with min assit for dynamic balance and LLE stabilization through quads.  Pt was mod assist with sit to stand.  Demonstrated good attention to LUE during activity. Pt completed bathing and dressing.     Postioned in wc and performed LUE NMRE with acitive movement in shoulder adduction.  Pt unable to feel LUE proprioceptive  But could feel light touch.  Ambulated with RW about 100 feet with Min assist and mod assist for turning.  Pt.left in room with all items in reach.   Therapy Documentation Precautions:  Precautions Precautions: Fall Precaution Comments: L hemiparesis, L lean in sitting and with ambulation Restrictions Weight Bearing Restrictions: No     Vital Signs: Therapy Vitals Temp: 98 F (36.7 C) Temp Source: Oral Pulse Rate: 83 Resp: 18 BP: 108/71 mmHg Oxygen Therapy SpO2: 97 % O2 Device: Not Delivered Pain:  none          See Function Navigator for Current Functional Status.   Therapy/Group: Individual Therapy  Lisa Roca 05/19/2015, 7:54 AM

## 2015-05-20 ENCOUNTER — Inpatient Hospital Stay (HOSPITAL_COMMUNITY): Payer: Medicaid Other | Admitting: *Deleted

## 2015-05-20 ENCOUNTER — Inpatient Hospital Stay (HOSPITAL_COMMUNITY): Payer: Medicaid Other | Admitting: Speech Pathology

## 2015-05-20 ENCOUNTER — Inpatient Hospital Stay (HOSPITAL_COMMUNITY): Payer: Medicaid Other | Admitting: Occupational Therapy

## 2015-05-20 LAB — CBC WITH DIFFERENTIAL/PLATELET
BASOS ABS: 0 10*3/uL (ref 0.0–0.1)
BASOS PCT: 0 %
EOS ABS: 0.1 10*3/uL (ref 0.0–0.7)
EOS PCT: 1 %
HCT: 46 % (ref 39.0–52.0)
Hemoglobin: 15.2 g/dL (ref 13.0–17.0)
LYMPHS ABS: 2.1 10*3/uL (ref 0.7–4.0)
Lymphocytes Relative: 20 %
MCH: 27.2 pg (ref 26.0–34.0)
MCHC: 33 g/dL (ref 30.0–36.0)
MCV: 82.3 fL (ref 78.0–100.0)
Monocytes Absolute: 0.8 10*3/uL (ref 0.1–1.0)
Monocytes Relative: 7 %
Neutro Abs: 7.8 10*3/uL — ABNORMAL HIGH (ref 1.7–7.7)
Neutrophils Relative %: 72 %
PLATELETS: 237 10*3/uL (ref 150–400)
RBC: 5.59 MIL/uL (ref 4.22–5.81)
RDW: 13 % (ref 11.5–15.5)
WBC: 10.8 10*3/uL — ABNORMAL HIGH (ref 4.0–10.5)

## 2015-05-20 LAB — GLUCOSE, CAPILLARY
GLUCOSE-CAPILLARY: 119 mg/dL — AB (ref 65–99)
GLUCOSE-CAPILLARY: 120 mg/dL — AB (ref 65–99)
GLUCOSE-CAPILLARY: 128 mg/dL — AB (ref 65–99)
Glucose-Capillary: 121 mg/dL — ABNORMAL HIGH (ref 65–99)

## 2015-05-20 NOTE — Progress Notes (Signed)
Physical Therapy Session Note  Patient Details  Name: Billy Torres MRN: [id number] Date of Birth: 08-Jul-1955  Today's Date: 05/20/2015 PT Individual Time: 1000-1100 PT Individual Time Calculation (min): 60 min     Skilled Therapeutic Interventions/Progress Updates:  Patient in room , sitting in the recliner at the beginning of session , agrees to therapy interventions, no complains of pain or discomfort verbalized or noted.  Short distance ambulation in room with RW with hand rest on L ,min A , cues for L LE progression. Nu Step 1 x 10 min with resistance at 3 in order to increase activity tolerance and reciprocal movement patterns.  Gait training with target stepping to increase step length and width, manual assistance applied on L ASIS for approximation of lateral lumbar flexors and facilitation of step through pattern , 2 x20 feet , RW as AD.  Step ups onto  Air filled disk with weight shifting forward in order to increase weight bearing and increase sensation coming form R LE. -mod A for balance- 2 x 10  [place]one tapping alternating feet 2 x 10 with mod A in order to improve dynamic standing balance and reciprocal weight shifting.  Patient requested to return to room to use restroom, min A for bathroom transfer and for hygiene.  At the end of session patient left in recliner with all needs within reach   Therapy Documentation Precautions:  Precautions Precautions: Fall Precaution Comments: L hemiparesis, L lean in sitting and with ambulation Restrictions Weight Bearing Restrictions: No   See Function Navigator for Current Functional Status.   Therapy/Group: Individual Therapy  [name] 05/20/2015, 11:16 AM

## 2015-05-20 NOTE — Progress Notes (Signed)
Speech Language Pathology Daily Session Note  Patient Details  Name: Billy Torres MRN: [id number] Date of Birth: 1955/01/11  Today's Date: 05/20/2015 SLP Individual Time: 0900-0930 SLP Individual Time Calculation (min): 30 min  Short Term Goals: Week 1: SLP Short Term Goal 1 (Week 1): Patient will recall new, daily information with supervision verbal cues.  SLP Short Term Goal 2 (Week 1): Patient will demonstrate complex problem solving for mildly complex and familiar tasks with supervision verbal and question cues.  SLP Short Term Goal 3 (Week 1): Patient will demonstrate alternating attention between two tasks  for 10 minutes with supervision verbal cues for redirection.  SLP Short Term Goal 4 (Week 1): Patient will self-monitor and correct errors with functional tasks with supervision question cues.   Skilled Therapeutic Interventions: Pt seen to address cognitive-linguistic goals. Pt required mod- max A for functional calendar use- writing information down. Pt's independent efforts were characterized by sparse details that did not communicate any information. Using the calendar once it was filled in required mod A for accuracy. When asked what he writes on his calendar, the pt replied, "Nothing, nothing and nothing."   Function:  Eating Eating   Modified Consistency Diet: No Eating Assist Level: Set up assist for   Eating Set Up Assist For: Cutting food;Opening containers       Cognition Comprehension Comprehension assist level: Understands basic 75 - 89% of the time/ requires cueing 10 - 24% of the time  Expression   Expression assist level: Expresses basic 90% of the time/requires cueing < 10% of the time.  Social Interaction Social Interaction assist level: Interacts appropriately 90% of the time - Needs monitoring or encouragement for participation or interaction.  Problem Solving Problem solving assist level: Solves basic 50 - 74% of the time/requires cueing 25 - 49% of  the time  Memory Memory assist level: Recognizes or recalls 50 - 74% of the time/requires cueing 25 - 49% of the time    Pain Pain Assessment Pain Assessment: No/denies pain  Therapy/Group: Individual Therapy  Vinetta Bergamo MA,CCC-SLP 05/20/2015, 12:41 PM

## 2015-05-20 NOTE — Progress Notes (Signed)
[facility]ILIT[facility]     PROGRESS NOTE  Subjective/Complaints:  Emesis reported last night by RN. Just had emptied bowels when I entered room. Patient without complaints this am.     ROS: Denies CP, SOB, nausea, vomiting, diarrhea.  Objective: Vital Signs: Blood pressure 122/69, pulse 93, temperature 98.9 F (37.2 C), temperature source Oral, resp. rate 18, height 5\' 8"  (1.727 m), weight 65.318 kg (144 lb), SpO2 97 %. No results found.  Recent Labs  05/18/15 0444 05/20/15 0609  WBC 11.8* 10.8*  HGB 14.7 15.2  HCT 44.8 46.0  PLT 255 237    Recent Labs  05/18/15 0444  NA 139  K 4.0  CL 102  GLUCOSE 131*  BUN 14  CREATININE 1.14  CALCIUM 9.4   CBG (last 3)   Recent Labs  05/19/15 1651 05/19/15 2205 05/20/15 0637  GLUCAP 125* 89 119*    Wt Readings from Last 3 Encounters:  05/17/15 65.318 kg (144 lb)  05/14/15 67.586 kg (149 lb)  05/12/15 67.586 kg (149 lb)    Physical Exam:  BP 122/69 mmHg  Pulse 93  Temp(Src) 98.9 F (37.2 C) (Oral)  Resp 18  Ht 5\' 8"  (1.727 m)  Wt 65.318 kg (144 lb)  BMI 21.90 kg/m2  SpO2 97% Constitutional: He appears well-developed and well-nourished. No distress. Vital signs reviewed. HENT: Normocephalic and atraumatic.  Eyes: Conjunctivae and EOM are normal.  Respiratory: Effort normal and breath sounds normal. No stridor. No respiratory distress. He has no wheezes.  GI: Soft. Bowel sounds are normal. He exhibits no distension. There is no tenderness.  Musculoskeletal: He exhibits no edema or tenderness.  Neurological: He is alert and oriented.  Mild dysarthria with mild left facial weakness.  Able to follow simple motor commands without difficulty. Has poor insight and lacks awareness of deficits Motor: Left upper extremity: Shoulder abduction, elbow flexion/extension 3+/5, hand grip 2/5  Left lower extremity and 4+/5 proximal to distal Right upper/right lower extremity 5/5 proximal to distal   Skin: Skin is warm and dry. He is not diaphoretic.  Psychiatric: He has a normal mood and affect. His behavior is normal. Thought content normal   Assessment/Plan: 1. Functional deficits secondary to right corona radiata infarct which require 3+ hours per day of interdisciplinary therapy in a comprehensive inpatient rehab setting. Physiatrist is providing close team supervision and 24 hour management of active medical problems listed below. Physiatrist and rehab team continue to assess barriers to discharge/monitor patient progress toward functional and medical goals.  Function:  Bathing Bathing position   Position: Shower  Bathing parts Body parts bathed by patient: Left arm, Chest, Abdomen, Front perineal area, Right upper leg, Left upper leg, Left lower leg Body parts bathed by helper: Right arm, Buttocks, Right lower leg, Back  Bathing assist Assist Level: Touching or steadying assistance(Pt > 75%)      Upper Body Dressing/Undressing Upper body dressing   What is the patient wearing?: Hospital gown                Upper body assist Assist Level: Set up   Set up : To obtain clothing/put away  Lower Body Dressing/Undressing Lower body dressing   What is the patient wearing?: Hospital Gown, Non-skid slipper socks         Non-skid slipper socks- Performed by patient: Don/doff right sock Non-skid slipper socks- Performed by helper: Don/doff left sock  Lower body assist Assist for lower body dressing: Touching or steadying assistance (Pt > 75%)      Toileting Toileting Toileting activity did not occur: No continent bowel/bladder event Toileting steps completed by patient: Performs perineal hygiene Toileting steps completed by helper: Adjust clothing prior to toileting, Adjust clothing after toileting Toileting Assistive Devices: Grab bar or rail  Toileting assist Assist level: Touching or steadying assistance (Pt.75%)   Transfers Chair/bed  transfer   Chair/bed transfer method: Ambulatory Chair/bed transfer assist level: Touching or steadying assistance (Pt > 75%) Chair/bed transfer assistive device: Armrests, Medical sales representative     Max distance: 150 Assist level: Touching or steadying assistance (Pt > 75%)   Wheelchair          Cognition Comprehension Comprehension assist level: Understands basic 90% of the time/cues < 10% of the time  Expression Expression assist level: Expresses basic 90% of the time/requires cueing < 10% of the time.  Social Interaction Social Interaction assist level: Interacts appropriately 90% of the time - Needs monitoring or encouragement for participation or interaction.  Problem Solving Problem solving assist level: Solves complex 90% of the time/cues < 10% of the time  Memory Memory assist level: Recognizes or recalls 75 - 89% of the time/requires cueing 10 - 24% of the time    Medical Problem List and Plan: 1. Left sided weakness, gait abnormalities secondary to right corona radiata infarct on 05/14/15.  Cont CIR 2. DVT Prophylaxis/Anticoagulation: Pharmaceutical: Lovenox 3. Pain Management: N/A 4. Mood: LCSW to follow for evaluation and support.  5. Neuropsych: This patient is not fully capable of making decisions on his own behalf. 6. Skin/Wound Care: Routine pressure relief measures. Maintain adequate nutritional and hydration status 7. Fluids/Electrolytes/Nutrition: Monitor I/O.   BMP within acceptable range at 5/11 8. T2DM: Monitor BS ac/hs. Resumed metformin 500 mg bid and trajenta 5 mg daily.    -remains fairly well controlled overall  Will monitor with increased activity and make adjustments as necessary 9. Dyslipidemia: on Lipitor daily.  10. HTN: Monitor BP.  Hold lisinopril to allow for adequate perfusion. Resume lisinopril if BP trends upwards. (still no indication yet)  11. Leukocytosis: afebrile  WBCs 10.8 today. I personally reviewed the patient's labs  today.   UA negative, Uculture with multispecies --will not recollect   LOS (Days) 3 A FACE TO FACE EVALUATION WAS PERFORMED  SWARTZ,ZACHARY T 05/20/2015 8:19 AM

## 2015-05-20 NOTE — Progress Notes (Signed)
Physical Therapy Session Note  Patient Details  Name: Billy Torres MRN: [id number] Date of Birth: [birth year]  Today's Date: 05/20/2015 PT Individual Time: [id number] PT Individual Time Calculation (min): 30 min     Skilled Therapeutic Interventions/Progress Updates:  Patient in bed, agrees to session, bed mobility with min A to come sitting EOB.  Session focused on training in appropriate step length during ambulation with manual assistance applied to L obliques in order to improve foot clearance and hip flexion.  Attention and standing balance training during activity with pipe tree building 2 x 4 min , visual feedback provided with ,irror in front in order to facilitate self correction and reduction of L side leaning. At the end of session patient returned to bed with all needs within reach and bed alarm on.   Therapy Documentation Precautions:  Precautions Precautions: Fall Precaution Comments: L hemiparesis, L lean in sitting and with ambulation Restrictions Weight Bearing Restrictions: No Vital Signs: Therapy Vitals Temp: 98.6 F (37 C) Temp Source: Oral Pulse Rate: 75 Resp: 16 BP: 125/75 mmHg Patient Position (if appropriate): Lying Oxygen Therapy SpO2: 98 % O2 Device: Not Delivered Pain: Pain Assessment Pain Assessment: No/denies pain    See Function Navigator for Current Functional Status.   Therapy/Group: Individual Therapy  [name] 05/20/2015, 3:52 PM

## 2015-05-20 NOTE — Progress Notes (Signed)
Occupational Therapy Session Note  Patient Details  Name: Billy Torres MRN: [id number] Date of Birth: 1955/05/05  Today's Date: 05/20/2015 OT Individual Time: 0801-0900 OT Individual Time Calculation (min): 59 min    Short Term Goals: Week 1:  OT Short Term Goal 1 (Week 1): STGs=LTGs secondary to short estimated LOS  Skilled Therapeutic Interventions/Progress Updates:  Upon entering the room, pt supine in bed with breakfast tray in front of him and dentures sitting on tray. OT give verbal cue for pt to place dentures in mouth for meal. Pt seated on EOB with L UE in weight bearing while eating meal with close supervision. Pt with left lateral lean to L and utilizing trunk to return to midline with min verbal meal while pt seated on EOB for 20 minutes. Pt performed sit <>stand with steady assist and ambulating to bathroom with min A for balance. Pt needing lifting assistance for toilet transfer and assist with clothing management. Pt returning to sit at recliner chair for active L shoulder shrugs 2 sets of 10. Pt also utilized R UE to assist with self ROM for L elbow flexion with pt pushing against minimal resistance for elbow extension x 10 reps. Pt remained in recliner chair with call bell and all needed items within reach upon exiting the room.   Therapy Documentation Precautions:  Precautions Precautions: Fall Precaution Comments: L hemiparesis, L lean in sitting and with ambulation Restrictions Weight Bearing Restrictions: No General:   Vital Signs: Therapy Vitals Temp: 98.9 F (37.2 C) Temp Source: Oral Pulse Rate: 93 Resp: 18 BP: 122/69 mmHg Patient Position (if appropriate): Lying Oxygen Therapy SpO2: 97 % O2 Device: Not Delivered Pain: Pain Assessment Pain Assessment: No/denies pain  See Function Navigator for Current Functional Status.   Therapy/Group: Individual Therapy  Phineas Semen 05/20/2015, 9:04 AM

## 2015-05-21 ENCOUNTER — Inpatient Hospital Stay (HOSPITAL_COMMUNITY): Payer: Medicaid Other | Admitting: Physical Therapy

## 2015-05-21 ENCOUNTER — Inpatient Hospital Stay (HOSPITAL_COMMUNITY): Payer: Self-pay | Admitting: Occupational Therapy

## 2015-05-21 DIAGNOSIS — M25511 Pain in right shoulder: Secondary | ICD-10-CM

## 2015-05-21 LAB — GLUCOSE, CAPILLARY
GLUCOSE-CAPILLARY: 119 mg/dL — AB (ref 65–99)
GLUCOSE-CAPILLARY: 117 mg/dL — AB (ref 65–99)
Glucose-Capillary: 116 mg/dL — ABNORMAL HIGH (ref 65–99)
Glucose-Capillary: 133 mg/dL — ABNORMAL HIGH (ref 65–99)

## 2015-05-21 MED ORDER — MUSCLE RUB 10-15 % EX CREA
TOPICAL_CREAM | CUTANEOUS | Status: DC | PRN
  Administered 2015-05-21 – 2015-05-25 (×3): via TOPICAL
  Filled 2015-05-21: qty 85

## 2015-05-21 NOTE — Progress Notes (Signed)
Physical Therapy Session Note  Patient Details  Name: Billy Torres MRN: [id number] Date of Birth: 08/21/1955  Today's Date: 05/21/2015 PT Individual Time: [id number] PT Individual Time Calculation (min): 40 min   Short Term Goals: Week 1:  PT Short Term Goal 1 (Week 1): Pt will transfer with consistent min assist for ambulatory transfers PT Short Term Goal 2 (Week 1): Pt will ambulate with LRAD and min assist x150' PT Short Term Goal 3 (Week 1): Pt will demonstrate increased awareness of LLE as evidenced by consistent R=L step length during gait PT Short Term Goal 4 (Week 1): Pt will maintain dynamic standing balance with min assist  Skilled Therapeutic Interventions/Progress Updates:   Patient in bed upon arrival, transferred to edge of bed with verbal cues for technique and min A. Patient declined need to toilet and ambulated x 130 ft using RW with L hand orthosis. Patient required total A to safely manage RW due to LUE weakness and mod-max A overall to weight shift to R to facilitate LLE clearance with max multimodal cues for weight shift R and increased L step length with heel strike. Performed sit <> stand transfers from mat without device x 15 with supervision. Trialled gait using WBQC with max multimodal cues for safe sequencing but improved weight shift to R noted with use of cane vs RW, mod A overall and brief periods of supervision. Patient stated he needed to use bathroom but unable to get to bathroom soon enough and was incontinent of urine standing in hallway. In room, transferred to toilet where patient able to finish voiding, total A to doff soiled clothing and setup assist/assist to wash feet for bathing seated on toilet. Donned brief and gown and patient stood at sink with min A for hand hygiene. Patient agreeable to sit up in wheelchair at end of session but no cushion present in room. Retrieved 16 x 16 wheelchair cushion and patient left sitting in wheelchair with needs in  reach, verbalized understanding to call for assistance when ready to return to bed.    Therapy Documentation Precautions:  Precautions Precautions: Fall Precaution Comments: L hemiparesis, L lean in sitting and with ambulation Restrictions Weight Bearing Restrictions: No Pain: Pain Assessment Pain Assessment: No/denies pain   See Function Navigator for Current Functional Status.   Therapy/Group: Individual Therapy  Laretta Alstrom 05/21/2015, 4:35 PM

## 2015-05-21 NOTE — Progress Notes (Signed)
[name]ILIT[facility]     PROGRESS NOTE  Subjective/Complaints:  States that his right shoulder is sore. Able to sleep. Pain non-radiating, more tender with ROM  ROS: Denies CP, SOB, nausea, vomiting, diarrhea.  Objective: Vital Signs: Blood pressure 124/68, pulse 84, temperature 98.6 F (37 C), temperature source Oral, resp. rate 18, height 5\' 8"  (1.727 m), weight 65.318 kg (144 lb), SpO2 98 %. No results found.  Recent Labs  05/20/15 0609  WBC 10.8*  HGB 15.2  HCT 46.0  PLT 237   No results for input(s): NA, K, CL, GLUCOSE, BUN, CREATININE, CALCIUM in the last 72 hours.  Invalid input(s): CO CBG (last 3)   Recent Labs  05/20/15 1633 05/20/15 2103 05/21/15 0720  GLUCAP 121* 128* 116*    Wt Readings from Last 3 Encounters:  05/17/15 65.318 kg (144 lb)  05/14/15 67.586 kg (149 lb)  05/12/15 67.586 kg (149 lb)    Physical Exam:  BP 124/68 mmHg  Pulse 84  Temp(Src) 98.6 F (37 C) (Oral)  Resp 18  Ht 5\' 8"  (1.727 m)  Wt 65.318 kg (144 lb)  BMI 21.90 kg/m2  SpO2 98% Constitutional: He appears well-developed and well-nourished. No distress. Vital signs reviewed. HENT: Normocephalic and atraumatic.  Eyes: Conjunctivae and EOM are normal.  Respiratory: Effort normal and breath sounds normal. No stridor. No respiratory distress. He has no wheezes.  GI: Soft. Bowel sounds are normal. He exhibits no distension. There is no tenderness.  Musculoskeletal: Mild right subacromial pain/deltoid tenderness with passive ER/IR  Neurological: He is alert and oriented.  Mild dysarthria with mild left facial weakness.  Able to follow simple motor commands without difficulty. Has poor insight and lacks awareness of deficits Motor: Left upper extremity: Shoulder abduction, elbow flexion/extension 3+/5, hand grip 2/5  Left lower extremity and 4+/5 proximal to distal Right upper/right lower extremity 5/5 proximal to distal  Skin: Skin is warm and dry. He  is not diaphoretic.  Psychiatric: He has a normal mood and affect. His behavior is normal. Thought content normal   Assessment/Plan: 1. Functional deficits secondary to right corona radiata infarct which require 3+ hours per day of interdisciplinary therapy in a comprehensive inpatient rehab setting. Physiatrist is providing close team supervision and 24 hour management of active medical problems listed below. Physiatrist and rehab team continue to assess barriers to discharge/monitor patient progress toward functional and medical goals.  Function:  Bathing Bathing position   Position: Shower  Bathing parts Body parts bathed by patient: Left arm, Chest, Abdomen, Front perineal area, Right upper leg, Left upper leg, Left lower leg Body parts bathed by helper: Right arm, Buttocks, Right lower leg, Back  Bathing assist Assist Level: Touching or steadying assistance(Pt > 75%)      Upper Body Dressing/Undressing Upper body dressing   What is the patient wearing?: Hospital gown                Upper body assist Assist Level: Set up   Set up : To obtain clothing/put away  Lower Body Dressing/Undressing Lower body dressing   What is the patient wearing?: Non-skid slipper socks         Non-skid slipper socks- Performed by patient: Don/doff right sock Non-skid slipper socks- Performed by helper: Don/doff right sock, Don/doff left sock                  Lower body assist Assist for lower body dressing: Touching or steadying assistance (Pt >  75%)      Toileting Toileting Toileting activity did not occur: No continent bowel/bladder event Toileting steps completed by patient: Performs perineal hygiene, Adjust clothing prior to toileting Toileting steps completed by helper: Adjust clothing after toileting Toileting Assistive Devices: Grab bar or rail  Toileting assist Assist level:  (mod A)   Transfers Chair/bed transfer   Chair/bed transfer method: Ambulatory Chair/bed  transfer assist level: Touching or steadying assistance (Pt > 75%) Chair/bed transfer assistive device: Armrests, Medical sales representative     Max distance: 150 Assist level: Touching or steadying assistance (Pt > 75%)   Wheelchair          Cognition Comprehension Comprehension assist level: Understands basic 90% of the time/cues < 10% of the time  Expression Expression assist level: Expresses basic 90% of the time/requires cueing < 10% of the time.  Social Interaction Social Interaction assist level: Interacts appropriately 90% of the time - Needs monitoring or encouragement for participation or interaction.  Problem Solving Problem solving assist level: Solves basic 50 - 74% of the time/requires cueing 25 - 49% of the time  Memory Memory assist level: Recognizes or recalls 50 - 74% of the time/requires cueing 25 - 49% of the time    Medical Problem List and Plan: 1. Left sided weakness, gait abnormalities secondary to right corona radiata infarct on 05/14/15.  Cont CIR 2. DVT Prophylaxis/Anticoagulation: Pharmaceutical: Lovenox 3. Pain Management: muscle rub for right shoulder  -reviewed need to use good form and the potential for overuse of the RUE. Reviewed ROM exercises 4. Mood: LCSW to follow for evaluation and support.  5. Neuropsych: This patient is not fully capable of making decisions on his own behalf. 6. Skin/Wound Care: Routine pressure relief measures. Maintain adequate nutritional and hydration status 7. Fluids/Electrolytes/Nutrition: Monitor I/O.   BMP within acceptable range at 5/11 8. T2DM: Monitor BS ac/hs. Resumed metformin 500 mg bid and trajenta 5 mg daily.    -remains fairly well controlled overall  Will monitor with increased activity and make adjustments as necessary 9. Dyslipidemia: on Lipitor daily.  10. HTN: Monitor BP.  Hold lisinopril to allow for adequate perfusion. Resume lisinopril if BP trends upwards. (still no indication yet)  11.  Leukocytosis: afebrile  WBCs 10.8 on 5/13.   UA negative, Uculture with multispecies --will not recollect   LOS (Days) 4 A FACE TO FACE EVALUATION WAS PERFORMED  Billy Torres T 05/21/2015 7:51 AM

## 2015-05-22 ENCOUNTER — Encounter (HOSPITAL_COMMUNITY): Payer: Self-pay

## 2015-05-22 ENCOUNTER — Inpatient Hospital Stay (HOSPITAL_COMMUNITY): Payer: Medicaid Other | Admitting: Occupational Therapy

## 2015-05-22 ENCOUNTER — Inpatient Hospital Stay (HOSPITAL_COMMUNITY): Payer: Medicaid Other | Admitting: Speech Pathology

## 2015-05-22 ENCOUNTER — Inpatient Hospital Stay (HOSPITAL_COMMUNITY): Payer: Medicaid Other | Admitting: Physical Therapy

## 2015-05-22 LAB — CBC WITH DIFFERENTIAL/PLATELET
BASOS ABS: 0 10*3/uL (ref 0.0–0.1)
Basophils Relative: 0 %
Eosinophils Absolute: 0.1 10*3/uL (ref 0.0–0.7)
Eosinophils Relative: 1 %
HEMATOCRIT: 47 % (ref 39.0–52.0)
HEMOGLOBIN: 15.6 g/dL (ref 13.0–17.0)
Lymphocytes Relative: 16 %
Lymphs Abs: 1.9 10*3/uL (ref 0.7–4.0)
MCH: 27.6 pg (ref 26.0–34.0)
MCHC: 33.2 g/dL (ref 30.0–36.0)
MCV: 83.2 fL (ref 78.0–100.0)
MONO ABS: 0.9 10*3/uL (ref 0.1–1.0)
Monocytes Relative: 8 %
Neutro Abs: 8.8 10*3/uL — ABNORMAL HIGH (ref 1.7–7.7)
Neutrophils Relative %: 75 %
Platelets: 243 10*3/uL (ref 150–400)
RBC: 5.65 MIL/uL (ref 4.22–5.81)
RDW: 12.8 % (ref 11.5–15.5)
WBC: 11.7 10*3/uL — ABNORMAL HIGH (ref 4.0–10.5)

## 2015-05-22 LAB — BASIC METABOLIC PANEL
ANION GAP: 9 (ref 5–15)
BUN: 15 mg/dL (ref 6–20)
CO2: 28 mmol/L (ref 22–32)
Calcium: 9.6 mg/dL (ref 8.9–10.3)
Chloride: 98 mmol/L — ABNORMAL LOW (ref 101–111)
Creatinine, Ser: 1.05 mg/dL (ref 0.61–1.24)
GFR calc non Af Amer: >60 mL/min (ref >60)
GLUCOSE: 118 mg/dL — AB (ref 65–99)
Potassium: 4.1 mmol/L (ref 3.5–5.1)
SODIUM: 135 mmol/L (ref 135–145)

## 2015-05-22 LAB — GLUCOSE, CAPILLARY
GLUCOSE-CAPILLARY: 124 mg/dL — AB (ref 65–99)
GLUCOSE-CAPILLARY: 86 mg/dL (ref 65–99)
Glucose-Capillary: 97 mg/dL (ref 65–99)
Glucose-Capillary: 148 mg/dL — ABNORMAL HIGH (ref 65–99)

## 2015-05-22 NOTE — Progress Notes (Signed)
PHYSICAL MEDICINE & REHABILITATION     PROGRESS NOTE  Subjective/Complaints:  Patient seen sitting up in bed this morning. He offers little feedback, but states that he had a good weekend.  ROS: Denies CP, SOB, nausea, vomiting, diarrhea.  Objective: Vital Signs: Blood pressure 124/71, pulse 82, temperature 98.6 F (37 C), temperature source Oral, resp. rate 18, height 5\' 8"  (1.727 m), weight 65.318 kg (144 lb), SpO2 97 %. No results found.  Recent Labs  05/20/15 0609  WBC 10.8*  HGB 15.2  HCT 46.0  PLT 237   No results for input(s): NA, K, CL, GLUCOSE, BUN, CREATININE, CALCIUM in the last 72 hours.  Invalid input(s): CO CBG (last 3)   Recent Labs  05/21/15 1634 05/21/15 2040 05/22/15 0702  GLUCAP 117* 133* 97    Wt Readings from Last 3 Encounters:  05/17/15 65.318 kg (144 lb)  05/14/15 67.586 kg (149 lb)  05/12/15 67.586 kg (149 lb)    Physical Exam:  BP 124/71 mmHg  Pulse 82  Temp(Src) 98.6 F (37 C) (Oral)  Resp 18  Ht 5\' 8"  (1.727 m)  Wt 65.318 kg (144 lb)  BMI 21.90 kg/m2  SpO2 97% Constitutional: He appears well-developed and well-nourished. No distress. Vital signs reviewed. HENT: Normocephalic and atraumatic.  Eyes: Conjunctivae and EOM are normal.  Respiratory: Effort normal and breath sounds normal. No stridor. No respiratory distress. He has no wheezes.  GI: Soft. Bowel sounds are normal. He exhibits no distension. There is no tenderness.  Musculoskeletal: No edema. Mild right subacromial pain/deltoid tenderness with passive ER/IR. Kinesiotaping to left upper extremity.  Neurological: He is alert and oriented.  Mild dysarthria with mild left facial weakness.  Able to follow simple motor commands without difficulty. Has poor insight and lacks awareness of deficits Motor: Left upper extremity: Shoulder abduction, elbow flexion/extension 4-/5, hand grip 2/5  Left lower extremity and 4+/5 proximal to distal Right upper/right lower  extremity 5/5 proximal to distal  Skin: Skin is warm and dry. He is not diaphoretic.  Psychiatric: He has a normal mood and affect. His behavior is normal. Thought content normal   Assessment/Plan: 1. Functional deficits secondary to right corona radiata infarct which require 3+ hours per day of interdisciplinary therapy in a comprehensive inpatient rehab setting. Physiatrist is providing close team supervision and 24 hour management of active medical problems listed below. Physiatrist and rehab team continue to assess barriers to discharge/monitor patient progress toward functional and medical goals.  Function:  Bathing Bathing position   Position: Shower  Bathing parts Body parts bathed by patient: Left arm, Chest, Abdomen, Front perineal area, Right upper leg, Left upper leg, Left lower leg Body parts bathed by helper: Right arm, Buttocks, Right lower leg, Back  Bathing assist Assist Level: Touching or steadying assistance(Pt > 75%)      Upper Body Dressing/Undressing Upper body dressing   What is the patient wearing?: Hospital gown                Upper body assist Assist Level: Set up   Set up : To obtain clothing/put away  Lower Body Dressing/Undressing Lower body dressing   What is the patient wearing?: Non-skid slipper socks         Non-skid slipper socks- Performed by patient: Don/doff right sock Non-skid slipper socks- Performed by helper: Don/doff right sock, Don/doff left sock                  Lower body assist  Assist for lower body dressing: Touching or steadying assistance (Pt > 75%)      Toileting Toileting Toileting activity did not occur: No continent bowel/bladder event Toileting steps completed by patient: Performs perineal hygiene Toileting steps completed by helper: Adjust clothing prior to toileting, Adjust clothing after toileting Toileting Assistive Devices: Grab bar or rail  Toileting assist Assist level: Touching or steadying  assistance (Pt.75%)   Transfers Chair/bed transfer   Chair/bed transfer method: Ambulatory Chair/bed transfer assist level: Maximal assist (Pt 25 - 49%/lift and lower) Chair/bed transfer assistive device: Armrests, Medical sales representative     Max distance: 130 Assist level: Maximal assist (Pt 25 - 49%)   Wheelchair          Cognition Comprehension Comprehension assist level: Understands basic 75 - 89% of the time/ requires cueing 10 - 24% of the time  Expression Expression assist level: Expresses basic 90% of the time/requires cueing < 10% of the time.  Social Interaction Social Interaction assist level: Interacts appropriately 90% of the time - Needs monitoring or encouragement for participation or interaction.  Problem Solving Problem solving assist level: Solves basic 50 - 74% of the time/requires cueing 25 - 49% of the time  Memory Memory assist level: Recognizes or recalls 50 - 74% of the time/requires cueing 25 - 49% of the time    Medical Problem List and Plan: 1. Left sided weakness, gait abnormalities secondary to right corona radiata infarct on 05/14/15.  Cont CIR 2. DVT Prophylaxis/Anticoagulation: Pharmaceutical: Lovenox 3. Pain Management: muscle rub for right shoulder  -reviewed need to use good form and the potential for overuse of the RUE. Reviewed ROM exercises 4. Mood: LCSW to follow for evaluation and support.  5. Neuropsych: This patient is not fully capable of making decisions on his own behalf. 6. Skin/Wound Care: Routine pressure relief measures. Maintain adequate nutritional and hydration status 7. Fluids/Electrolytes/Nutrition: Monitor I/O.   BMP within acceptable range at 5/11 8. T2DM: Monitor BS ac/hs. Resumed metformin 500 mg bid and trajenta 5 mg daily.   -remains fairly well controlled   Will monitor with increased activity and make adjustments as necessary 9. Dyslipidemia: on Lipitor daily.  10. HTN: Monitor BP.  Hold lisinopril to  allow for adequate perfusion. Resume lisinopril if BP trends upwards. (still no indication yet)  11. Leukocytosis: afebrile, Trending down  WBCs 10.8 on 5/13.   UA negative, Uculture with multispecies on 5/11 --will not recollect   LOS (Days) 5 A FACE TO FACE EVALUATION WAS PERFORMED  Ankit Lorie Phenix 05/22/2015 9:15 AM

## 2015-05-22 NOTE — Progress Notes (Signed)
Physical Therapy Session Note  Patient Details  Name: Billy Torres MRN: [id number] Date of Birth: 27-Oct-1955  Today's Date: 05/22/2015 PT Individual Time: 1108-1206 PT Individual Time Calculation (min): 58 min   Short Term Goals: Week 1:  PT Short Term Goal 1 (Week 1): Pt will transfer with consistent min assist for ambulatory transfers PT Short Term Goal 2 (Week 1): Pt will ambulate with LRAD and min assist x150' PT Short Term Goal 3 (Week 1): Pt will demonstrate increased awareness of LLE as evidenced by consistent R=L step length during gait PT Short Term Goal 4 (Week 1): Pt will maintain dynamic standing balance with min assist  Skilled Therapeutic Interventions/Progress Updates:    Pt received resting in w/c with no c/o pain and agreeable to therapy session.  Pt denied need to toilet prior to leaving room.  Session focus on gait training with LRAD, transfers, and LLE NMR.   Pt transfers throughout session with close supervision and verbal cues for hand placement 100% of the time for stand>sit and 10% of the time for sit>stand.    Gait training x150' with RW and no hand splint with mod assist for walker positioning and cues for L lean.  Gait training 2x150' with RW and steady assist with occasional cues for L step length, slowed pace, and cues for correction of L lean.  Gait training x 38' with [facility] with mod assist and total multimodal cues for correct sequencing and placement of cane.    Pt requesting to toilet half way through session.  Ambulated back to room with RW and performed toilet transfer and tasks with steady assist and max cues for sequencing and balance.    NMR for LLE alternating toe taps on 6" box focus on increased speed for rapid alternating weight shifts, and increased hip flexion for clearance of LLE.  PT instructed pt in 8 reps of step ups using LLE for increased strength and NMR.    Pt returned to room at end of session and positioned upright in w/c with call  bell in reach and needs met.   Therapy Documentation Precautions:  Precautions Precautions: Fall Precaution Comments: L hemiparesis, L lean in sitting and with ambulation Restrictions Weight Bearing Restrictions: No   See Function Navigator for Current Functional Status.   Therapy/Group: Individual Therapy  [name] 05/22/2015, 12:39 PM

## 2015-05-22 NOTE — Progress Notes (Signed)
Speech Language Pathology Daily Session Note  Patient Details  Name: Billy Torres MRN: [id number] Date of Birth: 07-25-1955  Today's Date: 05/22/2015 SLP Individual Time: 0800-0855 SLP Individual Time Calculation (min): 55 min  Short Term Goals: Week 1: SLP Short Term Goal 1 (Week 1): Patient will recall new, daily information with supervision verbal cues.  SLP Short Term Goal 2 (Week 1): Patient will demonstrate complex problem solving for mildly complex and familiar tasks with supervision verbal and question cues.  SLP Short Term Goal 3 (Week 1): Patient will demonstrate alternating attention between two tasks  for 10 minutes with supervision verbal cues for redirection.  SLP Short Term Goal 4 (Week 1): Patient will self-monitor and correct errors with functional tasks with supervision question cues.   Skilled Therapeutic Interventions: Skilled treatment session focused on cognitive goals. SLP facilitated session by providing supervision verbal cues for alternating attention between self-feeding and functional conversation for ~15 minutes. Patient recalled events from this weekend with supervision question cues and identified his physical deficits with Mod I but required total A to identify any cognitive deficits. Patient also participated in a conversation that focused on anticipatory awareness and safety at home in which patient participated in with Mod A verbal and question cues. Patient left upright in bed with all needs within reach. Continue with current plan of care.    Function:  Cognition Comprehension Comprehension assist level: Understands basic 50 - 74% of the time/ requires cueing 25 - 49% of the time  Expression   Expression assist level: Expresses basic 90% of the time/requires cueing < 10% of the time.  Social Interaction Social Interaction assist level: Interacts appropriately 90% of the time - Needs monitoring or encouragement for participation or interaction.   Problem Solving Problem solving assist level: Solves basic 50 - 74% of the time/requires cueing 25 - 49% of the time  Memory Memory assist level: Recognizes or recalls 50 - 74% of the time/requires cueing 25 - 49% of the time    Pain No/Denies Pain   Therapy/Group: Individual Therapy  [name] 05/22/2015, 3:40 PM

## 2015-05-22 NOTE — Progress Notes (Signed)
Occupational Therapy Session Note  Patient Details  Name: Billy Torres MRN: [id number] Date of Birth: 1955/08/24  Today's Date: 05/22/2015 OT Individual Time: 1000-1100 and [id number] OT Individual Time Calculation (min): 60 min and 30 min    Short Term Goals: Week 1:  OT Short Term Goal 1 (Week 1): STGs=LTGs secondary to short estimated LOS  Skilled Therapeutic Interventions/Progress Updates:  Session 1: Upon entering the room, pt supine in bed awaiting therapist with no c/o pain this session. Pt ambulating to bathroom with min A for safety and use of RW. Pt transferred onto TTB for bathing at shower level. Pt needing mod multimodal cues to initiate, sequence, and attend to functional task this session. Pt standing once in session with steady assist for balance to wash buttocks. Pt returning to sit on EOB to don hospital gown. Pt then ambulating 5' with RW and min A to sit at sink in wheelchair for grooming tasks with set up A and min cues for initiation. Pt continues to display L lean especially as pt fatigues. Pt seated in recliner chair at end of session with call bell and all needed items within reach upon exiting the room.   Session 2:  Upon entering the room, pt seated in wheelchair with no c/o pain this session. Pt agreeable to OT intervention. OT urging pt to attempt toileting at the beginning of the session. Pt ambulated with RW and min A 10' into bathroom. Pt required assistance for clothing management. Pt able to perform hygiene after toileting while seated on commode. Pt returned ambulated to bathroom sink in same manner with min verbal cues to wash hands for hygiene before returning to wheelchair. OT propelled wheelchair towards washing machine on other unit with secondary to time and energy management. Pt ambulated without use of AD with mod A for balance secondary to small space. Pt placing/sorting items from bag into machine with mod cues and to turn machine on. Pt returning to  wheelchair where he utilized R UE and LE to propelled wheelchair 100' towards room with min A. Pt remained in wheelchair at end of session with call bell and all needed items within reach.   Therapy Documentation Precautions:  Precautions Precautions: Fall Precaution Comments: L hemiparesis, L lean in sitting and with ambulation Restrictions Weight Bearing Restrictions: No General:  See Function Navigator for Current Functional Status.   Therapy/Group: Individual Therapy  Phineas Semen 05/22/2015, 12:42 PM

## 2015-05-23 ENCOUNTER — Inpatient Hospital Stay (HOSPITAL_COMMUNITY): Payer: Medicaid Other | Admitting: Speech Pathology

## 2015-05-23 ENCOUNTER — Inpatient Hospital Stay (HOSPITAL_COMMUNITY): Payer: Medicaid Other | Admitting: Occupational Therapy

## 2015-05-23 ENCOUNTER — Inpatient Hospital Stay (HOSPITAL_COMMUNITY): Payer: Self-pay | Admitting: Physical Therapy

## 2015-05-23 ENCOUNTER — Inpatient Hospital Stay (HOSPITAL_COMMUNITY): Payer: Medicaid Other

## 2015-05-23 DIAGNOSIS — I639 Cerebral infarction, unspecified: Secondary | ICD-10-CM | POA: Insufficient documentation

## 2015-05-23 LAB — GLUCOSE, CAPILLARY
GLUCOSE-CAPILLARY: 88 mg/dL (ref 65–99)
GLUCOSE-CAPILLARY: 105 mg/dL — AB (ref 65–99)
Glucose-Capillary: 131 mg/dL — ABNORMAL HIGH (ref 65–99)
Glucose-Capillary: 105 mg/dL — ABNORMAL HIGH (ref 65–99)

## 2015-05-23 NOTE — Progress Notes (Signed)
[place] PHYSICAL MEDICINE & REHABILITATION     PROGRESS NOTE  Subjective/Complaints:  Patient sitting up in his chair eating breakfast this morning. He denies pain in his left shoulder.  ROS: Denies left shoulder pain, CP, SOB, nausea, vomiting, diarrhea.  Objective: Vital Signs: Blood pressure 117/66, pulse 76, temperature 98 F (36.7 C), temperature source Oral, resp. rate 18, height 5\' 8"  (1.727 m), weight 65.454 kg (144 lb 4.8 oz), SpO2 99 %. No results found.  Recent Labs  05/22/15 0853  WBC 11.7*  HGB 15.6  HCT 47.0  PLT 243    Recent Labs  05/22/15 0853  NA 135  K 4.1  CL 98*  GLUCOSE 118*  BUN 15  CREATININE 1.05  CALCIUM 9.6   CBG (last 3)   Recent Labs  05/22/15 1624 05/22/15 2104 05/23/15 0627  GLUCAP 124* 86 88    Wt Readings from Last 3 Encounters:  05/23/15 65.454 kg (144 lb 4.8 oz)  05/14/15 67.586 kg (149 lb)  05/12/15 67.586 kg (149 lb)    Physical Exam:  BP 117/66 mmHg  Pulse 76  Temp(Src) 98 F (36.7 C) (Oral)  Resp 18  Ht 5\' 8"  (1.727 m)  Wt 65.454 kg (144 lb 4.8 oz)  BMI 21.95 kg/m2  SpO2 99% Constitutional: He appears well-developed and well-nourished. No distress. Vital signs reviewed. HENT: Normocephalic and atraumatic.  Eyes: Conjunctivae and EOM are normal.  Respiratory: Effort normal and breath sounds normal. No stridor. No respiratory distress. He has no wheezes.  GI: Soft. Bowel sounds are normal. He exhibits no distension. There is no tenderness.  Musculoskeletal: No edema. No tenderness. Kinesiotaping to left upper extremity.  Neurological: He is alert and oriented.  Mild dysarthria with mild left facial weakness.  Able to follow simple motor commands without difficulty. Has poor insight and lacks awareness of deficits Motor: Left upper extremity: Shoulder abduction, elbow flexion/extension 4-/5, hand grip 2/5  Left lower extremity and 4+/5 proximal to distal Right upper/right lower extremity 5/5 proximal to  distal  Skin: Skin is warm and dry. He is not diaphoretic.  Psychiatric: He has a normal mood and affect. His behavior is normal. Thought content normal   Assessment/Plan: 1. Functional deficits secondary to right corona radiata infarct which require 3+ hours per day of interdisciplinary therapy in a comprehensive inpatient rehab setting. Physiatrist is providing close team supervision and 24 hour management of active medical problems listed below. Physiatrist and rehab team continue to assess barriers to discharge/monitor patient progress toward functional and medical goals.  Function:  Bathing Bathing position   Position: Shower  Bathing parts Body parts bathed by patient: Left arm, Chest, Abdomen, Front perineal area, Right upper leg, Left upper leg, Left lower leg, Buttocks, Right lower leg Body parts bathed by helper: Right arm  Bathing assist Assist Level: Touching or steadying assistance(Pt > 75%)      Upper Body Dressing/Undressing Upper body dressing   What is the patient wearing?: Hospital gown                Upper body assist Assist Level: Set up   Set up : To obtain clothing/put away  Lower Body Dressing/Undressing Lower body dressing   What is the patient wearing?: Non-skid slipper socks         Non-skid slipper socks- Performed by patient: Don/doff right sock, Don/doff left sock Non-skid slipper socks- Performed by helper: Don/doff right sock, Don/doff left sock  Lower body assist Assist for lower body dressing: Touching or steadying assistance (Pt > 75%)      Toileting Toileting Toileting activity did not occur:  (handoff to NT) Toileting steps completed by patient: Performs perineal hygiene Toileting steps completed by helper: Adjust clothing prior to toileting, Adjust clothing after toileting Toileting Assistive Devices: Grab bar or rail  Toileting assist Assist level:  (max a)   Transfers Chair/bed transfer   Chair/bed  transfer method: Ambulatory Chair/bed transfer assist level: Touching or steadying assistance (Pt > 75%) Chair/bed transfer assistive device: Armrests, Medical sales representative     Max distance: 150 Assist level: Touching or steadying assistance (Pt > 75%)   Wheelchair          Cognition Comprehension Comprehension assist level: Understands basic 50 - 74% of the time/ requires cueing 25 - 49% of the time  Expression Expression assist level: Expresses basic 90% of the time/requires cueing < 10% of the time.  Social Interaction Social Interaction assist level: Interacts appropriately 90% of the time - Needs monitoring or encouragement for participation or interaction.  Problem Solving Problem solving assist level: Solves basic 50 - 74% of the time/requires cueing 25 - 49% of the time  Memory Memory assist level: Recognizes or recalls 50 - 74% of the time/requires cueing 25 - 49% of the time    Medical Problem List and Plan: 1. Left sided weakness, gait abnormalities secondary to right corona radiata infarct on 05/14/15.  Cont CIR 2. DVT Prophylaxis/Anticoagulation: Pharmaceutical: Lovenox 3. Pain Management: muscle rub for right shoulder  -reviewed need to use good form and the potential for overuse of the RUE. Reviewed ROM exercises 4. Mood: LCSW to follow for evaluation and support.  5. Neuropsych: This patient is not fully capable of making decisions on his own behalf. 6. Skin/Wound Care: Routine pressure relief measures. Maintain adequate nutritional and hydration status 7. Fluids/Electrolytes/Nutrition: Monitor I/O.   BMP within acceptable range at 5/15 8. T2DM: Monitor BS ac/hs. Resumed metformin 500 mg bid and trajenta 5 mg daily.   Remains fairly well controlled   Will monitor with increased activity and make adjustments if necessary 9. Dyslipidemia: on Lipitor daily.  10. HTN: Monitor BP.  Hold lisinopril to allow for adequate perfusion. Resume lisinopril if BP  trends upwards. (still no indication yet)  11. Leukocytosis: afebrile,   WBCs 11.7 on 5/15.   UA negative, Uculture with multispecies on 5/11 --will recollect   Will also order CXR  LOS (Days) 6 A FACE TO FACE EVALUATION WAS PERFORMED  Ankit Lorie Phenix 05/23/2015 8:40 AM

## 2015-05-23 NOTE — Progress Notes (Signed)
Physical Therapy Session Note  Patient Details  Name: Billy Torres MRN: [id number] Date of Birth: 10-17-1955  Today's Date: 05/23/2015 PT Individual Time: 1008-1108 PT Individual Time Calculation (min): 60 min   Short Term Goals: Week 1:  PT Short Term Goal 1 (Week 1): Pt will transfer with consistent min assist for ambulatory transfers PT Short Term Goal 2 (Week 1): Pt will ambulate with LRAD and min assist x150' PT Short Term Goal 3 (Week 1): Pt will demonstrate increased awareness of LLE as evidenced by consistent R=L step length during gait PT Short Term Goal 4 (Week 1): Pt will maintain dynamic standing balance with min assist  Skilled Therapeutic Interventions/Progress Updates:    Pt received resting in w/c with no c/o pain and agreeable to therapy session.  Session focus on gait with RW, transfers, safety awareness, balance, and NMR.    Pt amb within room with close supervision to/from toilet and performed toileting tasks, hygiene, and clothing management with steady assist.  Pt transfers sit<>stand throughout session with supervision.  Pt performs ambulatory transfers with close supervision fade to steady assist with fatigue.  Pt with one LOB to L when transferring into w/c at end of session, pt demonstrates protective response with LUE to bed to maintain balance but required max assist to return to midline.    Gait training throughout session with RW and close supervision with occasional steady assist for LOB to front/L when fatigued.  NMR and LE strengthening with side stepping to R and L 2x30' each side, minisquats x20 with tactile cues for correct posture, and calf raises 2x10 with tactile cues for increased gastroc contraction on LLE.  Balance retrainining focus on tapping R and L toes on target in semi-circle pattern x8 reps with mod assist to maintain balance.    Pt amb back to room at end of session with close supervision and positioned in w/c with call bell in reach and  needs met.   Therapy Documentation Precautions:  Precautions Precautions: Fall Precaution Comments: L hemiparesis, L lean in sitting and with ambulation Restrictions Weight Bearing Restrictions: No   See Function Navigator for Current Functional Status.   Therapy/Group: Individual Therapy  Genelle Economou E Penven-Crew 05/23/2015, 11:43 AM

## 2015-05-23 NOTE — Progress Notes (Signed)
Occupational Therapy Session Note  Patient Details  Name: Billy Torres MRN: [id number] Date of Birth: 15-Oct-1955  Today's Date: 05/23/2015 OT Individual Time: 0901-1000 OT Individual Time Calculation (min): 59 min    Short Term Goals: Week 1:  OT Short Term Goal 1 (Week 1): STGs=LTGs secondary to short estimated LOS  Skilled Therapeutic Interventions/Progress Updates:  Upon entering the room, pt seated in recliner chair awaiting therapist with no c/o pain. Pt performed sit <>stand with supervision and min verbal cues for proper technique. Pt ambulating with RW to bathroom for toileting. Pt able to perform hygiene and clothing management with min A for balance. Pt ambulated in room with RW to obtain clothing items from dresser with pt squatting to open dresser with steady assist. Pt began leaning to the L but unable to self correct requiring mod A for therapist to right pt. Pt obtained clothing items and returned to sink for bathing and dressing at sink side. OT placing wash cloth over L UE and giving hand over hand assist to utilize in self care functional tasks. Pt having clothes this session to don with min A for balance when pulling elastic waist pants over B hips. Pt required min verbal cues and increased time for initiation this session.  Pt remaining in wheelchair at end of session with call bell and all needed items within reach upon exiting the room.   Therapy Documentation Precautions:  Precautions Precautions: Fall Precaution Comments: L hemiparesis, L lean in sitting and with ambulation Restrictions Weight Bearing Restrictions: No  See Function Navigator for Current Functional Status.   Therapy/Group: Individual Therapy  Phineas Semen 05/23/2015, 12:46 PM

## 2015-05-23 NOTE — Progress Notes (Signed)
Speech Language Pathology Daily Session Note  Patient Details  Name: Billy Torres MRN: [id number] Date of Birth: 07-06-1955  Today's Date: 05/23/2015 SLP Individual Time: 1500-1530 SLP Individual Time Calculation (min): 30 min  Short Term Goals: Week 1: SLP Short Term Goal 1 (Week 1): Patient will recall new, daily information with supervision verbal cues.  SLP Short Term Goal 2 (Week 1): Patient will demonstrate complex problem solving for mildly complex and familiar tasks with supervision verbal and question cues.  SLP Short Term Goal 3 (Week 1): Patient will demonstrate alternating attention between two tasks  for 10 minutes with supervision verbal cues for redirection.  SLP Short Term Goal 4 (Week 1): Patient will self-monitor and correct errors with functional tasks with supervision question cues.   Skilled Therapeutic Interventions: Skilled treatment session focused on cognitive goals. SLP facilitated session by providing Min A question and verbal cues for patient to utilize schedule to anticipate upcoming therapy sessions and to problem solve ways to maximize his visual acuity of the schedule due to not currently having his glasses. Patient recalled events from the previous therapy sessions with extra time and supervision question cues. Patient demonstrated selective attention to functional conversation in a mildly distracting environment with Mod I for ~20 minutes. Patient left upright in wheelchair with all needs within reach. Continue with current plan of care.    Function:  Cognition Comprehension Comprehension assist level: Understands basic 75 - 89% of the time/ requires cueing 10 - 24% of the time  Expression   Expression assist level: Expresses basic 90% of the time/requires cueing < 10% of the time.  Social Interaction Social Interaction assist level: Interacts appropriately 90% of the time - Needs monitoring or encouragement for participation or interaction.  Problem  Solving Problem solving assist level: Solves basic 75 - 89% of the time/requires cueing 10 - 24% of the time  Memory Memory assist level: Recognizes or recalls 75 - 89% of the time/requires cueing 10 - 24% of the time    Pain No/Denies Pain   Therapy/Group: Individual Therapy  Rasheed Welty 05/23/2015, 3:42 PM

## 2015-05-23 NOTE — Progress Notes (Signed)
Physical Therapy Session Note  Patient Details  Name: Billy Torres MRN: [id number] Date of Birth: January 06, 1956  Today's Date: 05/23/2015 PT Individual Time: 0355-9741 PT Individual Time Calculation (min): 55 min   Short Term Goals: Week 1:  PT Short Term Goal 1 (Week 1): Pt will transfer with consistent min assist for ambulatory transfers PT Short Term Goal 2 (Week 1): Pt will ambulate with LRAD and min assist x150' PT Short Term Goal 3 (Week 1): Pt will demonstrate increased awareness of LLE as evidenced by consistent R=L step length during gait PT Short Term Goal 4 (Week 1): Pt will maintain dynamic standing balance with min assist  Skilled Therapeutic Interventions/Progress Updates:    Pt received resting in w/c with no c/o pain and agreeable to therapy session.  Session focus on transfers, turning, NMR, LLE kinesthetic awareness, stair negotiation, and gait with RW.   Pt requesting to toilet as PT entered room.  Amb to/from bathroom with close supervision, and pt performed toileting transfers, hygiene, and clothing management with close supervision and min cues.  Pt transfers sit<>stand throughout session with supervision but requires min assist and increased verbal cues for attention to L foot with pivot/ambulatory transfers.    NMR for reciprocal stepping pattern in sitting x3 minutes and in standing x10 reps at kinetron level 25 cm/s.  Pt required mod assist to maintain balance during standing exercise and extended seated rest break after each trial.    Gait training throughout session with RW, max distance 150'.  Pt required anywhere from supervision to min assist with increasing level of assist needed when pt fatigued.  Pt continues to require occasional min/mod verbal cues for attention to LLE especially during turns.  PT provided education on pt's decreased proprioception resulting from CVA and educated pt on using visual feedback to compensate for affected proprioception during  mobility tasks.  Pt verbalized understanding, but unable to carry over without cues, education ongoing.  PT instructed pt in stair negotiation x4 steps with forward ascent using RW and mod assist with max multimodal cues for sequencing and forward descent using R descending hand rail with mod assist and max multimodal cues for sequencing and maintaining midline orientation.  PT also discussed pt's family installing a rail at home for the stairs and pt states that he would ask his family when he spoke to them.    Pt returned to room at end of session, requiring 2 standing rest breaks due to fatigue, and mod assist during transfer to w/c due to increased L lean.  Pt seated upright in w/c with call bell in reach and needs met.   Therapy Documentation Precautions:  Precautions Precautions: Fall Precaution Comments: L hemiparesis, L lean in sitting and with ambulation Restrictions Weight Bearing Restrictions: No General:   Vital Signs:   Pain:   Mobility:   Locomotion :    Trunk/Postural Assessment :    Balance:   Exercises:   Other Treatments:     See Function Navigator for Current Functional Status.   Therapy/Group: Individual Therapy  [name] 05/23/2015, 4:30 PM

## 2015-05-24 ENCOUNTER — Inpatient Hospital Stay (HOSPITAL_COMMUNITY): Payer: Medicaid Other | Admitting: Occupational Therapy

## 2015-05-24 ENCOUNTER — Inpatient Hospital Stay (HOSPITAL_COMMUNITY): Payer: Medicaid Other | Admitting: Speech Pathology

## 2015-05-24 ENCOUNTER — Inpatient Hospital Stay (HOSPITAL_COMMUNITY): Payer: Self-pay | Admitting: Physical Therapy

## 2015-05-24 DIAGNOSIS — I69919 Unspecified symptoms and signs involving cognitive functions following unspecified cerebrovascular disease: Secondary | ICD-10-CM

## 2015-05-24 DIAGNOSIS — F0390 Unspecified dementia without behavioral disturbance: Secondary | ICD-10-CM | POA: Diagnosis present

## 2015-05-24 LAB — CREATININE, SERUM: CREATININE: 1.09 mg/dL (ref 0.61–1.24)

## 2015-05-24 LAB — GLUCOSE, CAPILLARY
GLUCOSE-CAPILLARY: 104 mg/dL — AB (ref 65–99)
Glucose-Capillary: 91 mg/dL (ref 65–99)
Glucose-Capillary: 140 mg/dL — ABNORMAL HIGH (ref 65–99)
Glucose-Capillary: 119 mg/dL — ABNORMAL HIGH (ref 65–99)

## 2015-05-24 LAB — URINE CULTURE

## 2015-05-24 MED ORDER — MEMANTINE HCL 10 MG PO TABS
5.0000 mg | ORAL_TABLET | Freq: Every day | ORAL | Status: DC
  Administered 2015-05-24 – 2015-05-31 (×8): 5 mg via ORAL
  Filled 2015-05-24 (×8): qty 1

## 2015-05-24 NOTE — Consult Note (Signed)
NEUROCOGNITIVE TESTING - [facility]   MEDICAL NECESSITY:  Billy Torres was seen on the [facility] for neurocognitive testing owing to the patient's diagnosis of CVA.   Records indicate that Billy Torres is a "60 y.o. RH-male with history of T2DM, HTN, L-MCA infarct 2015 without residual deficits who had been off plavix/ASA X 1 month. He was admitted on 05/14/15 with mild left sided weakness X 2 days with multiple falls MRI/MRA brain showed small acute non-hemorrhagic infarct posterior right corona radiata to posterior limb of internal capsule, global atropy and remote/chronic small vessel disease. 2D echo with EF 65-70% without abnormality. Carotid dopplers without ICA stenosis. He did have worsening of symptoms with confusion after admission and follow up MRI showed minimal enlargement.  Dr. Leonie Man recommended resuming ASA 81 mg and Plavix for stroke felt to be due to small vessel disease.  Patient with resultant left sided weakness with ataxia, problems with processing with cognitive deficits and balance deficits.   Therapy evaluation done yesterday and CIR recommended for follow up therapy."  During today's visit, Billy Torres reported experiencing only mild memory loss for recent material. No other cognitive changes indicated.  Emotionally, he denied depression or anxiety. He appears to be adjusting well to this admission and he has a benign psychiatric history. Suicidal/homicidal ideation, plan or intent was denied. No manic or hypomanic episodes were reported. The patient denied ever experiencing any auditory/visual hallucinations. No major behavioral or personality changes were endorsed.   Billy Torres feels that he is making progress in therapy. He described the rehab staff as "good." He was living with his son prior to this hospitalization and the plan is for him to return to this situation. He has other family to support  him throughout this endeavor as well.   The patient was referred for neuropsychological consultation given the possibility of cognitive sequelae subsequent to the current medical status and in order to assist in treatment planning.   PROCEDURES: [2 units of 96118]  Diagnostic Interview Medical record review Behavioral observations  Neuropsychological testing  Of note, one subtest was not administered due to certain physical limitations.   TEST RESULTS:   RBANS Indices Scaled Score Percentile Description  Immediate Memory  57 <1 Markedly impaired  Visuospatial/Constructional 87 19 Below average  Language 85 16 Below average  Attention N/A N/A N/A  Delayed Memory 56 <1 Markedly impaired  Total Score N/A N/A N/A    RBANS Subtests Percentile Description  List Learning 1 Markedly impaired  Story Memory <1 Markedly impaired  Figure Copy 10 Below average  Line Orientation 55 Average  Picture Naming 8 Mildly impaired  Semantic Fluency 4 Impaired  Digit Span 45 Average  Coding N/A N/A  List Recall <1 Markedly impaired  List Recognition <1 Markedly impaired  Story Recall <1 Markedly impaired  Figure recall 55 Average     IMPRESSION: Overall, Billy Torres denied suffering from any major cognitive issues. However, brief cognitive evaluation suggests the presence of markedly impaired learning and memory (verbal) and semantic fluency. Processing speed also appears slowed. No major mood symptoms reported.   Billy Torres performance is consistent with a diagnosis of Major Neurocognitive Disorder (i.e., dementia) with the most probable etiology being cerebrovascular comprise. However, given striking issues with verbal memory, I cannot fully rule out an underlying neurodegenerative disorder such as Alzheimer's disease. I recommend comprehensive neuropsychological evaluation upon discharge as well as a standard dementia work-up (i.e., neurology consult; metabolic labs such  as B12 and thyroid  panel, etc.). Regardless, implementing cholinesterase inhibitor appears warranted.    RECOMMENDATIONS  Recommendations for treatment team:  . When interacting with Billy Torres, directions and information should be provided in a simple, straight forward manner, and the treatment team should avoid giving multiple instructions simultaneously.  Marland Kitchen He may also benefit from being provided with multiple trials to learn new skills given the noted memory inefficiencies.  . To the extent possible, multitasking should be avoided. Marland Kitchen He requires more time than typical to process information. The treatment team may benefit from waiting for a verbal response to information before presenting additional information.   Recommendations for discharge planning:  . Complete a comprehensive neuropsychological evaluation as an outpatient in 2-3 months to assess for interval change. This can be done through Norton Pastel, PsyD by calling the following number: [phone].  . Maintain engagement in mentally, physically and cognitively stimulating activities.  . Strive to maintain a healthy lifestyle (e.g., proper diet and exercise) in order to promote physical, cognitive and emotional health.  . Due to the nature and severity of the symptoms noted during this evaluation, it is recommended that he initially obtain constant care and supervision following this hospitalization.  . Establishing a power of attorney is warranted.  . The patient should refrain from driving at this time.       Rutha Bouchard, Psy.D.  Clinical Neuropsychologist  Rehabilitation Psychologist

## 2015-05-24 NOTE — Progress Notes (Signed)
Physical Therapy Session Note  Patient Details  Name: Billy Torres MRN: [id number] Date of Birth: February 18, 1955  Today's Date: 05/24/2015 PT Individual Time: 1402-1430 PT Individual Time Calculation (min): 28 min   Short Term Goals: Week 1:  PT Short Term Goal 1 (Week 1): Pt will transfer with consistent min assist for ambulatory transfers PT Short Term Goal 2 (Week 1): Pt will ambulate with LRAD and min assist x150' PT Short Term Goal 3 (Week 1): Pt will demonstrate increased awareness of LLE as evidenced by consistent R=L step length during gait PT Short Term Goal 4 (Week 1): Pt will maintain dynamic standing balance with min assist  Skilled Therapeutic Interventions/Progress Updates:    Pt received resting in recliner with no c/o pain and agreeable to therapy session.  Session focus on LUE use, balance, transfers, and gait.    Pt transfers sit<>stand throughout session with RW and supervision.  Pt performs ambulatory transfers and stand/pivot transfers with steady assist and constant verbal cues for attention to LLE.    Gait training 2x150' with RW and steady assist overall with mod cues for attention to LLE, with max cues during turns.    PT instructed pt in dynamic standing balance activity focus on forced use of LUE reaching to L for horse shoes.  Forced use AROM of LUE with reduced friction to slide wash cloth along a table.    Pt returned to room at end of session and left upright in recliner with call bell in reach and needs met.   Therapy Documentation Precautions:  Precautions Precautions: Fall Precaution Comments: L hemiparesis, L lean in sitting and with ambulation Restrictions Weight Bearing Restrictions: No   See Function Navigator for Current Functional Status.   Therapy/Group: Individual Therapy  [name] 05/24/2015, 4:31 PM

## 2015-05-24 NOTE — Progress Notes (Deleted)
Pt started on namenda after

## 2015-05-24 NOTE — Progress Notes (Signed)
Speech Language Pathology Daily Session Note  Patient Details  Name: Billy Torres MRN: [id number] Date of Birth: July 11, 1955  Today's Date: 05/24/2015 SLP Individual Time: 1305-1400 SLP Individual Time Calculation (min): 55 min  Short Term Goals: Week 1: SLP Short Term Goal 1 (Week 1): Patient will recall new, daily information with supervision verbal cues.  SLP Short Term Goal 2 (Week 1): Patient will demonstrate complex problem solving for mildly complex and familiar tasks with supervision verbal and question cues.  SLP Short Term Goal 3 (Week 1): Patient will demonstrate alternating attention between two tasks  for 10 minutes with supervision verbal cues for redirection.  SLP Short Term Goal 4 (Week 1): Patient will self-monitor and correct errors with functional tasks with supervision question cues.   Skilled Therapeutic Interventions:  Pt was seen for skilled ST targeting cognitive goals.  Pt was able to recall activities from previous AM therapy session, including previous therapist's name and discipline with supervision question cues; however, he required mod assist question cues for recall of specific speech therapy goals.  SLP facilitated the session with repetition of medication management to continue to address self-monitoring and correction of errors.  Pt required min assist verbal cues to recognize and correct errors when organizing medications into a BID pill box.  Pt was able to generate solution to medication management (i.e. Asking son to help him with medications) with mod assist question cues.   Pt initiated request to use the bathroom and demonstrated safe walker use when ambulating to and from bathroom with supervision verbal cues.  Pt required min assist faded to supervision verbal cues to ask for assistance appropriately during basic and semi-complex tasks.  Pt was left in recliner with call bell within reach.  Continue per current plan of care.     Function:  Eating Eating                 Cognition Comprehension Comprehension assist level: Understands complex 90% of the time/cues 10% of the time  Expression   Expression assist level: Expresses complex 90% of the time/cues < 10% of the time  Social Interaction Social Interaction assist level: Interacts appropriately 90% of the time - Needs monitoring or encouragement for participation or interaction.  Problem Solving Problem solving assist level: Solves basic 75 - 89% of the time/requires cueing 10 - 24% of the time  Memory Memory assist level: Recognizes or recalls 75 - 89% of the time/requires cueing 10 - 24% of the time    Pain Pain Assessment Pain Assessment: No/denies pain  Therapy/Group: Individual Therapy  Lilla Callejo, Selinda Orion 05/24/2015, 2:22 PM

## 2015-05-24 NOTE — Plan of Care (Signed)
Problem: RH Balance Goal: LTG Patient will maintain dynamic standing balance (PT) LTG: Patient will maintain dynamic standing balance with assistance during mobility activities (PT)  Downgraded due to poor carryover with therapy sessions and ongoing deficits with LLE proprioception and safety awareness  Problem: RH Bed to Chair Transfers Goal: LTG Patient will perform bed/chair transfers w/assist (PT) LTG: Patient will perform bed/chair transfers with assistance, with/without cues (PT).  Downgraded due to poor carryover in therapy sessions and ongoing deficits with LLE proprioception and safety awareness  Problem: RH Car Transfers Goal: LTG Patient will perform car transfers with assist (PT) LTG: Patient will perform car transfers with assistance (PT).  Downgraded due to poor carryover in therapy sessions and ongoing deficits with LLE proprioception and safety awareness  Problem: RH Furniture Transfers Goal: LTG Patient will perform furniture transfers w/assist (OT/PT LTG: Patient will perform furniture transfers with assistance (OT/PT).  Downgraded due to poor carryover in therapy sessions and ongoing deficits with LLE proprioception and safety awareness  Problem: RH Ambulation Goal: LTG Patient will ambulate in controlled environment (PT) LTG: Patient will ambulate in a controlled environment, # of feet with assistance (PT).  Downgraded due to poor carryover in therapy sessions and ongoing deficits with LLE proprioception and safety awareness Goal: LTG Patient will ambulate in home environment (PT) LTG: Patient will ambulate in home environment, # of feet with assistance (PT).  Downgraded due to poor carryover in therapy sessions and ongoing deficits with LLE proprioception and safety awareness

## 2015-05-24 NOTE — Patient Care Conference (Signed)
Inpatient RehabilitationTeam Conference and Plan of Care Update Date: 05/24/2015   Time: 2:45 PM    Patient Name: Billy Torres      Medical Record Number: [id number]  Date of Birth: 1955/02/26 Sex: Male         Room/Bed: 4M03C/4M03C-01 Payor Info: Payor: MEDICAID PENDING / Plan: MEDICAID PENDING / Product Type: *No Product type* /    Admitting Diagnosis: CVA  Admit Date/Time:  05/17/2015  4:21 PM Admission Comments: No comment available   Primary Diagnosis:  <principal problem not specified> Principal Problem: <principal problem not specified>  Patient Active Problem List   Diagnosis Date Noted  . Dementia   . Cerebral infarction due to unspecified mechanism   . Leukocytosis   . Acute thrombotic stroke ([place]) 05/17/2015  . Diabetes mellitus due to underlying condition with complication, without long-term current use of insulin ([place])   . Gait disturbance, post-stroke   . Hemiparesis ([place])   . Cognitive deficit due to old cerebral infarction   . Benign essential HTN   . Diabetes mellitus type 2 in nonobese ([facility])   . Essential hypertension   . History of CVA (cerebrovascular accident) without residual deficits   . Ataxia, post-stroke   . Dysarthria, post-stroke   . Tobacco abuse   . HLD (hyperlipidemia)   . Acute CVA (cerebrovascular accident) ([place]) 05/15/2015  . Left-sided weakness 05/14/2015  . Right shoulder pain 03/03/2014  . History of CVA (cerebrovascular accident) 02/28/2014  . Smoking 06/29/2013  . CVA (cerebral infarction) 06/29/2013  . Dyslipidemia 06/29/2013  . TIA (transient ischemic attack) 06/27/2013  . Diabetes ([place]) 06/27/2013    Expected Discharge Date: Expected Discharge Date: 05/30/15  Team Members Present: Physician leading conference: Dr. Alger Simons Social Worker Present: Lennart Pall, LCSW Nurse Present: Dorien Chihuahua, RN PT Present: Dwyane Dee, PT OT Present: Benay Pillow, OT SLP Present: Weston Anna, SLP PPS Coordinator  present : Daiva Nakayama, RN, CRRN     Current Status/Progress Goal Weekly Team Focus  Medical   Left sided weakness, gait abnormalities secondary to right corona radiata infarct on 05/14/15  Improve safety, awareness, follow DM, follow leukocytosis  see above   Bowel/Bladder   Continent Bowel/Bladder LBM %/14  Patient to remain continent of Bowel/Bladder while in [facility]  Assess Bowel/Bladder need and timed toilet patient Q2hrs   Swallow/Nutrition/ Hydration             ADL's   steady/min A overall with decreased L UE coordination, strength, and safety awareness.   supervision overall  L UE NMR, balance, safety, self care retraining, pt /family education   Mobility   supervision/min assist overall, continues to demonstrate decreased left lower extremity coordination/awareness during static/dynamic standing tasks  supervision overall, min assist on stairs 2/2 no rails  progressing all mobility, balance, LLE coordination   Communication             Safety/Cognition/ Behavioral Observations  Min-Mod A   Supervision  attention, problem solving with mildly complex tasks, recall with use of strategies and awareness   Pain   Denied any pain or discomfort  <3  Assess and treat pain Q shift and as needed.   Skin   Patient L lower and upper extremities swelling  Patient skin to be free of infection/breakdown  Assess skin Q shift and as needed    Rehab Goals Patient on target to meet rehab goals: No Rehab Goals Revised: very poor carryover and may not reach initially targeted goals of supervision *See  Care Plan and progress notes for long and short-term goals.  Barriers to Discharge: Safety awareness, mobility, transfers, leukocytosis, cognitive deficits    Possible Resolutions to Barriers:  follow elevated WBC workup, pt and family edu, therapies    Discharge Planning/Teaching Needs:  Very uncertain d/c plan as family all working and pt expected to need 24/7 supervision at a minimum.  May need  to consider SNF.      Team Discussion:  Little change in cognitive functioning overall. Very poor recall and no carry-over between sessions.  No awareness of where his left foot is and requires constant verbal cues. Some confabulation at times.  Still requiring min assist but had hoped to reach a supervision level.  Concern that family cannot provide 24/7 assistance and will likely need to discuss SNF.    Revisions to Treatment Plan:  Not at this time.   Continued Need for Acute Rehabilitation Level of Care: The patient requires daily medical management by a physician with specialized training in physical medicine and rehabilitation for the following conditions: Daily direction of a multidisciplinary physical rehabilitation program to ensure safe treatment while eliciting the highest outcome that is of practical value to the patient.: Yes Daily medical management of patient stability for increased activity during participation in an intensive rehabilitation regime.: Yes Daily analysis of laboratory values and/or radiology reports with any subsequent need for medication adjustment of medical intervention for : Neurological problems;Diabetes problems;Other  Billy Torres 05/25/2015, 8:32 AM

## 2015-05-24 NOTE — Progress Notes (Addendum)
[facility] PHYSICAL MEDICINE & REHABILITATION     PROGRESS NOTE  Subjective/Complaints:  Patient sitting up in his chair eating breakfast this morning. He states that he is going to be upset with "these people" if he is not able to use his hand after discharge. Explained to him sequela of stroke, realistic expectations, and participation in therapies.   ROS: Denies left shoulder pain, CP, SOB, nausea, vomiting, diarrhea.  Objective: Vital Signs: Blood pressure 118/60, pulse 85, temperature 98.2 F (36.8 C), temperature source Oral, resp. rate 18, height 5\' 8"  (1.727 m), weight 65.454 kg (144 lb 4.8 oz), SpO2 98 %. Dg Chest Port 1 View  05/23/2015  CLINICAL DATA:  Leukocytosis. EXAM: PORTABLE CHEST 1 VIEW COMPARISON:  05/14/2015 FINDINGS: The heart size and mediastinal contours are within normal limits. Both lungs are clear. The visualized skeletal structures are unremarkable. IMPRESSION: No active disease. Electronically Signed   By: Lajean Manes M.D.   On: 05/23/2015 09:51    Recent Labs  05/22/15 0853  WBC 11.7*  HGB 15.6  HCT 47.0  PLT 243    Recent Labs  05/22/15 0853 05/24/15 0431  NA 135  --   K 4.1  --   CL 98*  --   GLUCOSE 118*  --   BUN 15  --   CREATININE 1.05 1.09  CALCIUM 9.6  --    CBG (last 3)   Recent Labs  05/23/15 1658 05/23/15 2041 05/24/15 0639  GLUCAP 131* 105* 91    Wt Readings from Last 3 Encounters:  05/23/15 65.454 kg (144 lb 4.8 oz)  05/14/15 67.586 kg (149 lb)  05/12/15 67.586 kg (149 lb)    Physical Exam:  BP 118/60 mmHg  Pulse 85  Temp(Src) 98.2 F (36.8 C) (Oral)  Resp 18  Ht 5\' 8"  (1.727 m)  Wt 65.454 kg (144 lb 4.8 oz)  BMI 21.95 kg/m2  SpO2 98% Constitutional: He appears well-developed and well-nourished. No distress. Vital signs reviewed. HENT: Normocephalic and atraumatic.  Eyes: Conjunctivae and EOM are normal.  Respiratory: Effort normal and breath sounds normal. No stridor. No respiratory distress. He has no  wheezes.  GI: Soft. Bowel sounds are normal. He exhibits no distension. There is no tenderness.  Musculoskeletal: No edema. No tenderness. Kinesiotaping to left upper extremity.  Neurological: He is alert and oriented.  Mild dysarthria with mild left facial weakness.  Able to follow simple motor commands without difficulty. Has poor insight and lacks awareness of deficits Motor: Left upper extremity: Shoulder abduction, elbow flexion/extension 4-/5, hand grip 2/5  Left lower extremity and 4+/5 proximal to distal Right upper/right lower extremity 5/5 proximal to distal  Skin: Skin is warm and dry. He is not diaphoretic.  Psychiatric: He has a normal mood and affect. His behavior is normal. Thought content normal   Assessment/Plan: 1. Functional deficits secondary to right corona radiata infarct which require 3+ hours per day of interdisciplinary therapy in a comprehensive inpatient rehab setting. Physiatrist is providing close team supervision and 24 hour management of active medical problems listed below. Physiatrist and rehab team continue to assess barriers to discharge/monitor patient progress toward functional and medical goals.  Function:  Bathing Bathing position   Position: Wheelchair/chair at sink  Bathing parts Body parts bathed by patient: Left arm, Chest, Abdomen, Front perineal area, Right upper leg, Left upper leg, Left lower leg, Buttocks, Right lower leg Body parts bathed by helper: Right arm, Back  Bathing assist Assist Level: Touching or steadying assistance(Pt >  75%)      Upper Body Dressing/Undressing Upper body dressing   What is the patient wearing?: Pull over shirt/dress     Pull over shirt/dress - Perfomed by patient: Thread/unthread right sleeve, Put head through opening, Pull shirt over trunk Pull over shirt/dress - Perfomed by helper: Thread/unthread left sleeve        Upper body assist Assist Level: Touching or steadying assistance(Pt > 75%)   Set  up : To obtain clothing/put away  Lower Body Dressing/Undressing Lower body dressing   What is the patient wearing?: Non-skid slipper socks, Pants     Pants- Performed by patient: Thread/unthread right pants leg, Pull pants up/down Pants- Performed by helper: Thread/unthread left pants leg Non-skid slipper socks- Performed by patient: Don/doff right sock, Don/doff left sock Non-skid slipper socks- Performed by helper: Don/doff right sock, Don/doff left sock                  Lower body assist Assist for lower body dressing: Touching or steadying assistance (Pt > 75%)      Toileting Toileting Toileting activity did not occur:  (handoff to NT) Toileting steps completed by patient: Adjust clothing prior to toileting, Performs perineal hygiene, Adjust clothing after toileting Toileting steps completed by helper: Adjust clothing prior to toileting, Adjust clothing after toileting Toileting Assistive Devices: Grab bar or rail  Toileting assist Assist level: Supervision or verbal cues   Transfers Chair/bed transfer   Chair/bed transfer method: Ambulatory Chair/bed transfer assist level: Touching or steadying assistance (Pt > 75%) Chair/bed transfer assistive device: Armrests, Medical sales representative     Max distance: 150 Assist level: Touching or steadying assistance (Pt > 75%)   Wheelchair          Cognition Comprehension Comprehension assist level: Understands basic 75 - 89% of the time/ requires cueing 10 - 24% of the time  Expression Expression assist level: Expresses basic 75 - 89% of the time/requires cueing 10 - 24% of the time. Needs helper to occlude trach/needs to repeat words.  Social Interaction Social Interaction assist level: Interacts appropriately 75 - 89% of the time - Needs redirection for appropriate language or to initiate interaction.  Problem Solving Problem solving assist level: Solves basic 75 - 89% of the time/requires cueing 10 - 24% of the  time  Memory Memory assist level: Recognizes or recalls 75 - 89% of the time/requires cueing 10 - 24% of the time    Medical Problem List and Plan: 1. Left sided weakness, gait abnormalities secondary to right corona radiata infarct on 05/14/15.  Cont CIR 2. DVT Prophylaxis/Anticoagulation: Pharmaceutical: Lovenox 3. Pain Management: muscle rub for right shoulder  -reviewed need to use good form and the potential for overuse of the RUE. Reviewed ROM exercises 4. Mood: LCSW to follow for evaluation and support.  5. Neuropsych: This patient is not fully capable of making decisions on his own behalf. 6. Skin/Wound Care: Routine pressure relief measures. Maintain adequate nutritional and hydration status 7. Fluids/Electrolytes/Nutrition: Monitor I/O.   BMP within acceptable range at 5/15 8. T2DM: Monitor BS ac/hs. Resumed metformin 500 mg bid and trajenta 5 mg daily.   Remains fairly well controlled   Will monitor with increased activity and make adjustments if necessary 9. Dyslipidemia: on Lipitor daily.  10. HTN: Monitor BP.  Hold lisinopril to allow for adequate perfusion. Resume lisinopril if BP trends upwards. (still no indication yet)  11. Leukocytosis: afebrile,   WBCs 11.7 on 5/15.  UA negative, Uculture with multispecies on 5/11- recollection results pending  CXR reviewed from 5/16, relatively unremarkable 12. Baseline dementia  Namenda started on 5/17  LOS (Days) 7 A FACE TO FACE EVALUATION WAS PERFORMED  Yandiel Bergum Lorie Phenix 05/24/2015 8:10 AM

## 2015-05-24 NOTE — Progress Notes (Addendum)
Occupational Therapy Session Note  Patient Details  Name: Billy Torres MRN: [id number] Date of Birth: January 04, 1956  Today's Date: 05/24/2015 OT Individual Time: [phone] OT Individual Time Calculation (min): 59 min    Short Term Goals: Week 1:  OT Short Term Goal 1 (Week 1): STGs=LTGs secondary to short estimated LOS  Skilled Therapeutic Interventions/Progress Updates:    Upon entering the room, pt seated in recliner chair awaiting therapist with no c/o pain this session. Pt verbalizing, "I am going to be mad at therapy if my arm isn't normal again." OT providing education to patient regarding progression with therapy for current deficits for stroke recovery and reviewed evidence based interventions utilized for CVA. OT also educating pt on secondary stroke risk education with pt verbalizing understanding. When asked to utilize L UE for tasks the pt states, "I can't do it." OT providing education on not self limiting and motivation pt to attempt all things before saying he can't perform task. OT propelled pt in wheelchair to day room for time management. OT demonstrated use of yellow, soft resistive theraputty for strength , weight bearing, and coordination of L hand. Pt able to flex digits 2 and 3 on command.However, when given functional task such as squeezing putty with fingers the pt is able to flex all 5 digits to accomplish task. OT having to utilize putty as visual feedback as pt does not believe he performed task. Pt also engaged in table slides with use of towel for shoulder flexion and horizontal abduction/adduction without use of R UE for assistance until end range of movement. Pt returning to room at end of session and remained seated in wheelchair with call bell and all needed items within reach upon exiting the room.   Therapy Documentation Precautions:  Precautions Precautions: Fall Precaution Comments: L hemiparesis, L lean in sitting and with ambulation Restrictions Weight  Bearing Restrictions: No  See Function Navigator for Current Functional Status.   Therapy/Group: Individual Therapy  Phineas Semen 05/24/2015, 11:23 AM

## 2015-05-25 ENCOUNTER — Inpatient Hospital Stay (HOSPITAL_COMMUNITY): Payer: Self-pay | Admitting: Physical Therapy

## 2015-05-25 ENCOUNTER — Inpatient Hospital Stay (HOSPITAL_COMMUNITY): Payer: Medicaid Other | Admitting: Occupational Therapy

## 2015-05-25 ENCOUNTER — Inpatient Hospital Stay (HOSPITAL_COMMUNITY): Payer: Medicaid Other | Admitting: Speech Pathology

## 2015-05-25 DIAGNOSIS — M6249 Contracture of muscle, multiple sites: Secondary | ICD-10-CM

## 2015-05-25 DIAGNOSIS — M62838 Other muscle spasm: Secondary | ICD-10-CM | POA: Diagnosis present

## 2015-05-25 LAB — GLUCOSE, CAPILLARY
GLUCOSE-CAPILLARY: 123 mg/dL — AB (ref 65–99)
Glucose-Capillary: 101 mg/dL — ABNORMAL HIGH (ref 65–99)
Glucose-Capillary: 94 mg/dL (ref 65–99)
Glucose-Capillary: 142 mg/dL — ABNORMAL HIGH (ref 65–99)

## 2015-05-25 MED ORDER — BACLOFEN 5 MG HALF TABLET
5.0000 mg | ORAL_TABLET | Freq: Three times a day (TID) | ORAL | Status: DC
  Administered 2015-05-25 – 2015-05-31 (×20): 5 mg via ORAL
  Filled 2015-05-25 (×20): qty 1

## 2015-05-25 NOTE — Progress Notes (Addendum)
[facility] PHYSICAL MEDICINE & REHABILITATION     PROGRESS NOTE  Subjective/Complaints:  Patient seen sitting up in his chair this morning about to eat breakfast. He is noticing improvement of strength in his left hand.   ROS: Denies left shoulder pain, CP, SOB, nausea, vomiting, diarrhea.  Objective: Vital Signs: Blood pressure 126/64, pulse 78, temperature 98.2 F (36.8 C), temperature source Oral, resp. rate 18, height 5\' 8"  (1.727 m), weight 65.454 kg (144 lb 4.8 oz), SpO2 98 %. Dg Chest Port 1 View  05/23/2015  CLINICAL DATA:  Leukocytosis. EXAM: PORTABLE CHEST 1 VIEW COMPARISON:  05/14/2015 FINDINGS: The heart size and mediastinal contours are within normal limits. Both lungs are clear. The visualized skeletal structures are unremarkable. IMPRESSION: No active disease. Electronically Signed   By: Lajean Manes M.D.   On: 05/23/2015 09:51    Recent Labs  05/22/15 0853  WBC 11.7*  HGB 15.6  HCT 47.0  PLT 243    Recent Labs  05/22/15 0853 05/24/15 0431  NA 135  --   K 4.1  --   CL 98*  --   GLUCOSE 118*  --   BUN 15  --   CREATININE 1.05 1.09  CALCIUM 9.6  --    CBG (last 3)   Recent Labs  05/24/15 1645 05/24/15 2044 05/25/15 0641  GLUCAP 104* 119* 101*    Wt Readings from Last 3 Encounters:  05/23/15 65.454 kg (144 lb 4.8 oz)  05/14/15 67.586 kg (149 lb)  05/12/15 67.586 kg (149 lb)    Physical Exam:  BP 126/64 mmHg  Pulse 78  Temp(Src) 98.2 F (36.8 C) (Oral)  Resp 18  Ht 5\' 8"  (1.727 m)  Wt 65.454 kg (144 lb 4.8 oz)  BMI 21.95 kg/m2  SpO2 98% Constitutional: He appears well-developed and well-nourished. No distress. Vital signs reviewed. HENT: Normocephalic and atraumatic.  Eyes: Conjunctivae and EOM are normal.  Respiratory: Effort normal and breath sounds normal. No stridor. No respiratory distress. He has no wheezes.  GI: Soft. Bowel sounds are normal. He exhibits no distension. There is no tenderness.  Musculoskeletal: No edema. No  tenderness. Kinesiotaping to left upper extremity.  Neurological: He is alert and oriented.  Mild dysarthria with mild left facial weakness.  Able to follow simple motor commands without difficulty. Has poor insight and lacks awareness of deficits Motor: Left upper extremity: Shoulder abduction, elbow flexion/extension 4-/5, hand grip 3/5   MAS 1/4 elbow flexors  Left lower extremity and 4+/5 proximal to distal Right upper/right lower extremity 5/5 proximal to distal  Skin: Skin is warm and dry. He is not diaphoretic.  Psychiatric: He has a normal mood and affect. His behavior is normal. Thought content normal   Assessment/Plan: 1. Functional deficits secondary to right corona radiata infarct which require 3+ hours per day of interdisciplinary therapy in a comprehensive inpatient rehab setting. Physiatrist is providing close team supervision and 24 hour management of active medical problems listed below. Physiatrist and rehab team continue to assess barriers to discharge/monitor patient progress toward functional and medical goals.  Function:  Bathing Bathing position   Position: Wheelchair/chair at sink  Bathing parts Body parts bathed by patient: Left arm, Chest, Abdomen, Front perineal area, Right upper leg, Left upper leg, Left lower leg, Buttocks, Right lower leg Body parts bathed by helper: Right arm, Back  Bathing assist Assist Level: Touching or steadying assistance(Pt > 75%)      Upper Body Dressing/Undressing Upper body dressing   What  is the patient wearing?: Pull over shirt/dress     Pull over shirt/dress - Perfomed by patient: Thread/unthread right sleeve, Put head through opening, Pull shirt over trunk Pull over shirt/dress - Perfomed by helper: Thread/unthread left sleeve        Upper body assist Assist Level: Touching or steadying assistance(Pt > 75%)   Set up : To obtain clothing/put away  Lower Body Dressing/Undressing Lower body dressing   What is the  patient wearing?: Non-skid slipper socks, Pants     Pants- Performed by patient: Thread/unthread right pants leg, Pull pants up/down Pants- Performed by helper: Thread/unthread left pants leg Non-skid slipper socks- Performed by patient: Don/doff right sock, Don/doff left sock Non-skid slipper socks- Performed by helper: Don/doff right sock, Don/doff left sock                  Lower body assist Assist for lower body dressing: Touching or steadying assistance (Pt > 75%)      Toileting Toileting Toileting activity did not occur:  (handoff to NT) Toileting steps completed by patient: Adjust clothing prior to toileting, Performs perineal hygiene, Adjust clothing after toileting Toileting steps completed by helper: Adjust clothing after toileting Toileting Assistive Devices: Grab bar or rail  Toileting assist Assist level: Touching or steadying assistance (Pt.75%)   Transfers Chair/bed transfer   Chair/bed transfer method: Ambulatory, Stand pivot Chair/bed transfer assist level: Touching or steadying assistance (Pt > 75%) Chair/bed transfer assistive device: Armrests, Medical sales representative     Max distance: 150 Assist level: Touching or steadying assistance (Pt > 75%)   Wheelchair          Cognition Comprehension Comprehension assist level: Understands complex 90% of the time/cues 10% of the time  Expression Expression assist level: Expresses complex 90% of the time/cues < 10% of the time  Social Interaction Social Interaction assist level: Interacts appropriately 90% of the time - Needs monitoring or encouragement for participation or interaction.  Problem Solving Problem solving assist level: Solves complex 90% of the time/cues < 10% of the time  Memory Memory assist level: Recognizes or recalls 90% of the time/requires cueing < 10% of the time    Medical Problem List and Plan: 1. Left sided weakness, gait abnormalities secondary to right corona radiata  infarct on 05/14/15.  Cont CIR  Spoke to therapies about a trial of CIT 2. DVT Prophylaxis/Anticoagulation: Pharmaceutical: Lovenox 3. Pain Management: muscle rub for right shoulder  -reviewed need to use good form and the potential for overuse of the RUE. Reviewed ROM exercises 4. Mood: LCSW to follow for evaluation and support.  5. Neuropsych: This patient is not fully capable of making decisions on his own behalf. 6. Skin/Wound Care: Routine pressure relief measures. Maintain adequate nutritional and hydration status 7. Fluids/Electrolytes/Nutrition: Monitor I/O.   BMP within acceptable range at 5/15 8. T2DM: Monitor BS ac/hs. Resumed metformin 500 mg bid and trajenta 5 mg daily.   Remains fairly well controlled   Will monitor with increased activity and make adjustments if necessary 9. Dyslipidemia: on Lipitor daily.  10. HTN: Monitor BP.  Hold lisinopril to allow for adequate perfusion. Resume lisinopril if BP trends upwards. (still no indication yet)  11. Leukocytosis: afebrile,   WBCs 11.7 on 5/15.   UA negative, Uculture with multispecies on 5/11- recollection results pending  CXR reviewed from 5/16, relatively unremarkable 12. Baseline dementia  Namenda started on 5/17  Will monitor for improvement, although this may take a  few weeks 13. Spasticity in left elbow flexors  Baclofen 5 started on 5/18  LOS (Days) 8 A FACE TO FACE EVALUATION WAS PERFORMED  Ranita Stjulien Lorie Phenix 05/25/2015 8:32 AM

## 2015-05-25 NOTE — Plan of Care (Signed)
Problem: RH Stairs Goal: LTG Patient will ambulate up and down stairs w/assist (PT) LTG: Patient will ambulate up and down # of stairs with assistance (PT)  Outcome: Not Applicable Date Met:  12/31/98 D/C goal as pt is now pending d/c to SNF due to ongoing need for min assist

## 2015-05-25 NOTE — Progress Notes (Signed)
Physical Therapy Weekly Progress Note  Patient Details  Name: Billy Torres MRN: [id number] Date of Birth: 1955-05-18  Beginning of progress report period: May 18, 2015 End of progress report period: May 25, 2015  Today's Date: 05/25/2015 PT Individual Time: 1020-1103 PT Individual Time Calculation (min): 43 min   Patient has met 3 of 4 short term goals.  Pt has made some progress with functional mobility but continues to demonstrate L inattention and decreased proprioception in LLE during functional mobility which is creating challenges with his progression from min assist to supervision level in preparation for d/c.  During team conference discussed possibility of D/C to SNF for further therapy as patient will not have recommended 24/7 assist at home.    Patient continues to demonstrate the following deficits: awareness, attention, balance, and activity tolerance and therefore will continue to benefit from skilled PT intervention to enhance overall performance with activity tolerance, balance, postural control, functional use of  left upper extremity and left lower extremity, attention and awareness.  Patient not progressing toward long term goals.  See goal revision..  Plan of care revisions: mobility and locomotion goals downgraded to min assist overall due to ongoing deficits with L side attention and overall awareness of deficits and lack of carry over from session to session. .  PT Short Term Goals Week 1:  PT Short Term Goal 1 (Week 1): Pt will transfer with consistent min assist for ambulatory transfers PT Short Term Goal 1 - Progress (Week 1): Met PT Short Term Goal 2 (Week 1): Pt will ambulate with LRAD and min assist x150' PT Short Term Goal 2 - Progress (Week 1): Met PT Short Term Goal 3 (Week 1): Pt will demonstrate increased awareness of LLE as evidenced by consistent R=L step length during gait PT Short Term Goal 3 - Progress (Week 1): Not met PT Short Term Goal 4 (Week  1): Pt will maintain dynamic standing balance with min assist PT Short Term Goal 4 - Progress (Week 1): Met Week 2:  PT Short Term Goal 1 (Week 2): =LTGs due to ELOS  Skilled Therapeutic Interventions/Progress Updates:    Pt received resting in recliner with no c/o pain and agreeable to therapy session. Session focus on transfers, gait with RW, attention to LLE during functional mobility, balance, and NMR.   Pt transfers sit<>stand throughout session with supervision and occasional verbal cues to reach back for surface before sitting.  Pt requires steady assist for pivot or ambulatory transfers 2/2 decreased awareness of LLE.    NMR via forced use and reciprocal stepping pattern on Nustep x12 minutes at level 4 with LUE splint and verbal cues for pacing and full knee extension.  NMR through LE weight bearing and weight shifting to tap LE on target in semi circle pattern with pt requiring mod fade to min assist.  Pt requires verbal cues to stay attended to task in busy gym throughout session.    Pt returned to room at end of session and positioned upright in w/c with call bell in reach and needs met.   Therapy Documentation Precautions:  Precautions Precautions: Fall Precaution Comments: L hemiparesis, L lean in sitting and with ambulation Restrictions Weight Bearing Restrictions: No   See Function Navigator for Current Functional Status.  Therapy/Group: Individual Therapy  [name] 05/25/2015, 12:06 PM

## 2015-05-25 NOTE — Progress Notes (Signed)
Physical Therapy Session Note  Patient Details  Name: Billy Torres MRN: [id number] Date of Birth: 1955/09/26  Today's Date: 05/25/2015 PT Individual Time: 1455-1550 PT Individual Time Calculation (min): 55 min   Short Term Goals: Week 2:  PT Short Term Goal 1 (Week 2): =LTGs due to ELOS  Skilled Therapeutic Interventions/Progress Updates:    Pt received resting in recliner with no c/o pain and agreeable to therapy session.  Session focus on transfers, gait, L NMR, and patient education.  Pt demos poor frustration tolerance, frequently "reminding" this therapist that he can't use his LUE or that his LLE is weak/doesn't work.  PT providing on going education regarding recovery from CVA and benefits of forced use vs compensatory strategies but pt minimally receptive to education and little, if any, carry over is noted from session to session.   Pt continues to require supervision for sit<>stand transfers with RW with occasional verbal cues for reaching back with hands to sit and min assist for pivot and ambulatory transfers with RW due to decreased kinesthetic awareness of LLE.    NMR via forced use in tall kneeling 2x5 minute trials while pt unstacked/restacked cups using LUE.  Pt initially stating that he can't grasp with his LUE but agreeable to try when PT reiterated purpose of forced use in terms of CVA recovery.  Pt successfully moved 6/12 cups from L to R without dropping cup or knocking over the stacks.  PT instructed pt in heel/toe raises and minisquats in standing 2x10 reps with verbal cues for correct form.  Pt states "I probably should use those dumb bells to get my arm working again."  PT provided pt education on decreased ability to move LUE through range against gravity and provided further education on CVA recovery including progressing from moving against gravity with correct form to using low resistance, etc.  Pt rolled his eyes at therapist.  PT instructed pt in 2x10 reps of  elbow flexion, shoulder flexion, and wrist flexion/extension against gravity with pt demonstrating compensatory strategies to complete ROM.  Pt required constant total multimodal cues to complete AROM of LUE in correct form without using compensatory strategies to increase ROM.   Pt returned to room at end of session and proceeded into bathroom.  Handoff to NT in bathroom.   Therapy Documentation Precautions:  Precautions Precautions: Fall Precaution Comments: L hemiparesis, L lean in sitting and with ambulation Restrictions Weight Bearing Restrictions: No   See Function Navigator for Current Functional Status.   Therapy/Group: Individual Therapy  [name] 05/25/2015, 4:44 PM

## 2015-05-25 NOTE — Progress Notes (Signed)
Occupational Therapy Weekly Progress Note  Patient Details  Name: Billy Torres MRN: [id number] Date of Birth: 02/11/1955  Beginning of progress report period: May 18, 2015 End of progress report period: May 25, 2015  Today's Date: 05/25/2015 OT Individual Time: 0900 - 1000   60 minutes of skilled OT intervention   Pt has made progress this week regarding self care for dressing techniques. He has increased AROM in L UE this session as well. Pt continues to have L lean with ambulation and unable to right himself without assistance. Pt's discharge plan has changed to SNF with rehab secondary to safety.   Patient continues to demonstrate the following deficits: decreased I in self care, balance, decreased coordination and strength in L UE, decreased cognition, safety awareness, functional balance/mobility and therefore will continue to benefit from skilled OT intervention to enhance overall performance with BADL.  Patient not progressing toward long term goals.  See goal revision..  Plan of care revisions: LTGs downgraded to min A secondary to safety and poor carryover during sessions. .  OT Short Term Goals Week 2:  OT Short Term Goal 1 (Week 2): STGs=LTGs secondary to upcoming discharge  Skilled Therapeutic Interventions/Progress Updates:    Upon entering the room, pt seated in recliner chair awaiting therapist. Pt declined shower but requesting to change clothing items. Pt refusing to ambulate to dresser to obtain clothing items. Pt verbalizing, "Go get them yourself. They are over there." OT providing education to pt regarding functional mobility and OT goals. Pt ambulated with steady assist to obtain clothing items with maximal encouragement from therapist. Pt seated in recliner chair and able to don pull over shirt with supervision and min verbal cues for hemiplegic dressing technique. Pt able to don elastic waist pants with increased encouragement as pt continues to state, "I can't use  this hand  (L)." OT continuing forced of L UE during this session. After dressing, pt able to do L UE  AROM exercises for 2 sets of 10 reps of shoulder shrugs, elbow flexion/extension, wrist ext/flexion against gravity with therapist decreasing compensatory movements. Pt able to flex all L digits this session to grip hand and 10-15 degree's of extension. Pt able to move thumb in all planes with increased concentration and time. Pt remained seated in recliner chair at end of session with call bell and all needed items within reach upon exiting the room.   Therapy Documentation Precautions:  Precautions Precautions: Fall Precaution Comments: L hemiparesis, L lean in sitting and with ambulation Restrictions Weight Bearing Restrictions: No Vital Signs: Therapy Vitals Temp: 98.6 F (37 C) Temp Source: Oral Pulse Rate: 91 Resp: 19 BP: 125/62 mmHg Patient Position (if appropriate): Sitting Oxygen Therapy SpO2: 100 % O2 Device: Not Delivered  See Function Navigator for Current Functional Status.   Therapy/Group: Individual Therapy  Phineas Semen 05/25/2015, 5:03 PM

## 2015-05-25 NOTE — Progress Notes (Addendum)
Speech Language Pathology Weekly Progress and Session Note  Patient Details  Name: Billy Torres MRN: [id number] Date of Birth: 14-Jun-1955  Beginning of progress report period: May 18, 2015 End of progress report period: May 25, 2015  Today's Date: 05/25/2015 SLP Individual Time: 1130-1200 SLP Individual Time Calculation (min): 30 min  Short Term Goals: Week 1: SLP Short Term Goal 1 (Week 1): Patient will recall new, daily information with supervision verbal cues.  SLP Short Term Goal 1 - Progress (Week 1): Not met SLP Short Term Goal 2 (Week 1): Patient will demonstrate complex problem solving for mildly complex and familiar tasks with supervision verbal and question cues.  SLP Short Term Goal 2 - Progress (Week 1): Not met SLP Short Term Goal 3 (Week 1): Patient will demonstrate alternating attention between two tasks  for 10 minutes with supervision verbal cues for redirection.  SLP Short Term Goal 3 - Progress (Week 1): Not met SLP Short Term Goal 4 (Week 1): Patient will self-monitor and correct errors with functional tasks with supervision question cues.  SLP Short Term Goal 4 - Progress (Week 1): Not met    New Short Term Goals: Week 2: SLP Short Term Goal 1 (Week 2): Patient will recall new, daily information with supervision verbal cues.  SLP Short Term Goal 2 (Week 2): Patient will demonstrate complex problem solving for mildly complex and familiar tasks with supervision verbal and question cues.  SLP Short Term Goal 3 (Week 2): Patient will demonstrate alternating attention between two tasks  for 10 minutes with supervision verbal cues for redirection.  SLP Short Term Goal 4 (Week 2): Patient will self-monitor and correct errors with functional tasks with supervision question cues.   Weekly Progress Updates: Although pt has not met short term goals, he has made progress towards goals and is close to mastery. Pt currently requires min A verbal cues with intermittent  supervision cues for attention, complex problem solving and self monitoring for errors.  Pt would continue to benefit from skilled ST while inpatient in order to maximize functional independence and reduce burden of care prior to discharge. Pt and family education is ongoing.    Intensity: Minumum of 1-2 x/day, 30 to 90 minutes Frequency: 3 to 5 out of 7 days Duration/Length of Stay: 05/30/2015 Treatment/Interventions: Cognitive remediation/compensation;Cueing hierarchy;Functional tasks;Patient/family education;Therapeutic Activities;Environmental controls;Internal/external aids   Daily Session  Skilled Therapeutic Interventions: Skilled treatment session focused on cognition goals. SLP facilitated session by providing min A verbal cues fading to intermittent supervision cues for problem solving medication management and for self-monitoring for errors. Pt with decreased ability to problem solve errors when SLP indicated them. Education provided on ways to problem solve these activities in the home envirornment. Pt left in wheelchair with all needs within reach. Continue current plan of care.      Function:    Cognition Comprehension Comprehension assist level: Understands complex 90% of the time/cues 10% of the time  Expression   Expression assist level: Expresses complex 90% of the time/cues < 10% of the time  Social Interaction Social Interaction assist level: Interacts appropriately 90% of the time - Needs monitoring or encouragement for participation or interaction.  Problem Solving Problem solving assist level: Solves basic 75 - 89% of the time/requires cueing 10 - 24% of the time  Memory Memory assist level: Recognizes or recalls 75 - 89% of the time/requires cueing 10 - 24% of the time   Pain Pain Assessment Faces Pain Scale: Hurts a little  bit  Therapy/Group: Individual Therapy  Shaley Leavens 05/25/2015, 12:43 PM

## 2015-05-25 NOTE — Progress Notes (Signed)
Physical Therapy Session Note  Patient Details  Name: Billy Torres MRN: [id number] Date of Birth: 08-30-1955  Today's Date: 05/25/2015 PT Individual Time:  -      Short Term Goals: Week 1:  PT Short Term Goal 1 (Week 1): Pt will transfer with consistent min assist for ambulatory transfers PT Short Term Goal 2 (Week 1): Pt will ambulate with LRAD and min assist x150' PT Short Term Goal 3 (Week 1): Pt will demonstrate increased awareness of LLE as evidenced by consistent R=L step length during gait PT Short Term Goal 4 (Week 1): Pt will maintain dynamic standing balance with min assist  Skilled Therapeutic Interventions/Progress Updates:    Pt received resting in recliner and agreeable to therapy session, requesting to shower.  Session focus on dynamic sitting balance, postural control, functional use of LUE, problem solving, transfers, and gait with RW.    Pt transfers sit<>stand from recliner with supervision and RW and amb to shower with steady assist and mod verbal cues for attention to LLE.  Pt transitions to TTB with steady assist and verbal cues for safety and is able to correctly recall purpose of shower mit provided by OT in earlier session.  Pt requires min cues for correctly donning shower mit.  Dynamic sitting balance, postural control, problem solving, and functional use of LUE training at shower level with intermittent steady assist for balance.    Pt transitioned back to recliner with steady assist and donned clean brief with sit<>stand, clean non-skid socks, and clean hospital gown.  Pt declining pants this session.    Pt left upright in recliner with call bell in reach and needs met.   Therapy Documentation Precautions:  Precautions Precautions: Fall Precaution Comments: L hemiparesis, L lean in sitting and with ambulation Restrictions Weight Bearing Restrictions: No   See Function Navigator for Current Functional Status.   Therapy/Group: Individual  Therapy  [name] 05/25/2015, 8:34 AM

## 2015-05-25 NOTE — Plan of Care (Signed)
Problem: RH Balance Goal: LTG Patient will maintain dynamic standing with ADLs (OT) LTG: Patient will maintain dynamic standing balance with assist during activities of daily living (OT)  Goals downgraded secondary to poor carryover and safety   Problem: RH Bathing Goal: LTG Patient will bathe with assist, cues/equipment (OT) LTG: Patient will bathe specified number of body parts with assist with/without cues using equipment (position) (OT)  Goals downgraded secondary to poor carryover and safety   Problem: RH Dressing Goal: LTG Patient will perform lower body dressing w/assist (OT) LTG: Patient will perform lower body dressing with assist, with/without cues in positioning using equipment (OT)  Goals downgraded secondary to poor carryover and safety   Problem: RH Toileting Goal: LTG Patient will perform toileting w/assist, cues/equip (OT) LTG: Patient will perform toiletiing (clothes management/hygiene) with assist, with/without cues using equipment (OT)  Goals downgraded secondary to poor carryover and safety   Problem: RH Functional Use of Upper Extremity Goal: LTG Patient will use RT/LT upper extremity as a (OT) LTG: Patient will use right/left upper extremity as a stabilizer/gross assist/diminished/nondominant/dominant level with assist, with/without cues during functional activity (OT)  Goals downgraded secondary to poor carryover and safety   Problem: RH Toilet Transfers Goal: LTG Patient will perform toilet transfers w/assist (OT) LTG: Patient will perform toilet transfers with assist, with/without cues using equipment (OT)  Goals downgraded secondary to poor carryover and safety   Problem: RH Tub/Shower Transfers Goal: LTG Patient will perform tub/shower transfers w/assist (OT) LTG: Patient will perform tub/shower transfers with assist, with/without cues using equipment (OT)  Goals downgraded secondary to poor carryover and safety

## 2015-05-25 NOTE — Progress Notes (Signed)
Social Work Patient ID: Billy Torres, male   DOB: 01/21/1955, 60 y.o.   MRN: [id number]   Met with pt after speaking with his son and ex-wife via phone to discuss team conference and d/c plan/ concerns.  All aware that team set goals for 24/7 assistance. Family confirms that they cannot provide this as they are all working f/t.  Family feels that they will need to change plan to SNF.  Pt also made aware of recommendation and is aware that family cannot provide this and is agreeable with SNF placement.  Will begin process.  Nova Schmuhl, LCSW

## 2015-05-26 ENCOUNTER — Inpatient Hospital Stay (HOSPITAL_COMMUNITY): Payer: Self-pay | Admitting: Physical Therapy

## 2015-05-26 ENCOUNTER — Inpatient Hospital Stay (HOSPITAL_COMMUNITY): Payer: Medicaid Other | Admitting: Occupational Therapy

## 2015-05-26 ENCOUNTER — Inpatient Hospital Stay (HOSPITAL_COMMUNITY): Payer: Medicaid Other | Admitting: Speech Pathology

## 2015-05-26 LAB — GLUCOSE, CAPILLARY
GLUCOSE-CAPILLARY: 112 mg/dL — AB (ref 65–99)
GLUCOSE-CAPILLARY: 160 mg/dL — AB (ref 65–99)
GLUCOSE-CAPILLARY: 108 mg/dL — AB (ref 65–99)
Glucose-Capillary: 107 mg/dL — ABNORMAL HIGH (ref 65–99)

## 2015-05-26 NOTE — Progress Notes (Signed)
Speech Language Pathology Daily Session Note  Patient Details  Name: Billy Torres MRN: [id number] Date of Birth: 14-Jun-1955  Today's Date: 05/26/2015 SLP Individual Time: 0800-0830 SLP Individual Time Calculation (min): 30 min  Short Term Goals: Week 2: SLP Short Term Goal 1 (Week 2): Patient will recall new, daily information with supervision verbal cues.  SLP Short Term Goal 2 (Week 2): Patient will demonstrate complex problem solving for mildly complex and familiar tasks with supervision verbal and question cues.  SLP Short Term Goal 3 (Week 2): Patient will demonstrate alternating attention between two tasks  for 10 minutes with supervision verbal cues for redirection.  SLP Short Term Goal 4 (Week 2): Patient will self-monitor and correct errors with functional tasks with supervision question cues.   Skilled Therapeutic Interventions: Skilled treatment session focused on cognitive goals. Upon arrival, patient was sitting upright in the recliner while consuming his breakfast. Patient was able to alternate attention between self-feeding and functional conversation with supervision verbal cues for ~20 minutes. SLP facilitated sessio by providing supervision-Min A question cues for goals of skilled therapy and for recall of exercises that clinicians have been asking him to perform during his own time. Patient also demonstrated emergent awareness of deficits and reported, "You know that saying you can't chew gum and walk at the same time, well that is true for me right now." Patient also independently recalled change in d/c plan to SNF and demonstrated emergent awareness into why more rehab is appropriate at this time. Patient left upright in recliner with all needs within reach. Continue with current plan of care.     Function:  Cognition Comprehension Comprehension assist level: Understands basic 90% of the time/cues < 10% of the time  Expression   Expression assist level: Expresses basic  90% of the time/requires cueing < 10% of the time.  Social Interaction Social Interaction assist level: Interacts appropriately 75 - 89% of the time - Needs redirection for appropriate language or to initiate interaction.  Problem Solving Problem solving assist level: Solves basic 90% of the time/requires cueing < 10% of the time  Memory Memory assist level: Recognizes or recalls 75 - 89% of the time/requires cueing 10 - 24% of the time    Pain No/Denies Pain   Therapy/Group: Individual Therapy  Sumayah Bearse 05/26/2015, 9:48 AM

## 2015-05-26 NOTE — Progress Notes (Signed)
Physical Therapy Session Note  Patient Details  Name: Billy Torres MRN: [id number] Date of Birth: May 31, 1955  Today's Date: 05/26/2015 PT Individual Time: 1105-1200 PT Individual Time Calculation (min): 55 min   Short Term Goals: Week 2:  PT Short Term Goal 1 (Week 2): =LTGs due to ELOS  Skilled Therapeutic Interventions/Progress Updates:    Pt received resting upright in w/c, with no c/o pain, and agreeable to therapy session.  Session focus on pt education, functional use of LUE, LLE weight bearing, weight shift R<>L, gait training and transfers with RW.    Pt transfers throughout session sit<>stand with RW and close supervision with verbal cues to remove L hand from splint before attempting to sit.  Pt requires steady assist for pivot and ambulatory transfers with RW with mod verbal cues for attention to L foot placement when stepping.    Gait training to/from therapy gym with steady assist and min/mod verbal cues for step length on L.    NMR: Forced use of LLE with RLE propped on step x3 trials of pipe tree activity 5-6 minutes each trial with rest breaks in between.  Weight shifting R<>L for L toe taps on 4" box with min fade to mod assist for balance with increasing need for verbal cues for weight shift to R to be able to lift LLE onto step.  Pt requiring extended rest break after task.    Pt returned to room at end of session and positioned upright in w/c with call bell in reach and needs met.   Therapy Documentation Precautions:  Precautions Precautions: Fall Precaution Comments: L hemiparesis, L lean in sitting and with ambulation Restrictions Weight Bearing Restrictions: No   See Function Navigator for Current Functional Status.   Therapy/Group: Individual Therapy  [name] 05/26/2015, 12:13 PM

## 2015-05-26 NOTE — Progress Notes (Signed)
[facility] PHYSICAL MEDICINE & REHABILITATION     PROGRESS NOTE  Subjective/Complaints:    No issues overnite, watching TV in bed ROS: Denies left shoulder pain, CP, SOB, nausea, vomiting, diarrhea.  Objective: Vital Signs: Blood pressure 130/73, pulse 88, temperature 98.3 F (36.8 C), temperature source Oral, resp. rate 17, height 5\' 8"  (1.727 m), weight 65.454 kg (144 lb 4.8 oz), SpO2 97 %. No results found. No results for input(s): WBC, HGB, HCT, PLT in the last 72 hours.  Recent Labs  05/24/15 0431  CREATININE 1.09   CBG (last 3)   Recent Labs  05/25/15 1128 05/25/15 1640 05/25/15 2033  GLUCAP 94 142* 123*    Wt Readings from Last 3 Encounters:  05/23/15 65.454 kg (144 lb 4.8 oz)  05/14/15 67.586 kg (149 lb)  05/12/15 67.586 kg (149 lb)    Physical Exam:  BP 130/73 mmHg  Pulse 88  Temp(Src) 98.3 F (36.8 C) (Oral)  Resp 17  Ht 5\' 8"  (1.727 m)  Wt 65.454 kg (144 lb 4.8 oz)  BMI 21.95 kg/m2  SpO2 97% Constitutional: He appears well-developed and well-nourished. No distress. Vital signs reviewed. HENT: Normocephalic and atraumatic.  Eyes: Conjunctivae and EOM are normal.  Respiratory: Effort normal and breath sounds normal. No stridor. No respiratory distress. He has no wheezes.  GI: Soft. Bowel sounds are normal. He exhibits no distension. There is no tenderness.  Musculoskeletal: No edema. No tenderness. Kinesiotaping to left upper extremity.  Neurological: He is alert and oriented.  Mild dysarthria with mild left facial weakness.  Able to follow simple motor commands without difficulty. Has poor insight and lacks awareness of deficits Motor: Left upper extremity: Shoulder abduction, elbow flexion/extension 4-/5, hand grip 3-/5   MAS 1/4 elbow flexors  Left lower extremity and 4+/5 proximal to distal, increased extensor tone Right upper/right lower extremity 5/5 proximal to distal  Skin: Skin is warm and dry. He is not diaphoretic.  Psychiatric: He  has a normal mood and affect. His behavior is normal. Thought content normal   Assessment/Plan: 1. Functional deficits secondary to right corona radiata infarct which require 3+ hours per day of interdisciplinary therapy in a comprehensive inpatient rehab setting. Physiatrist is providing close team supervision and 24 hour management of active medical problems listed below. Physiatrist and rehab team continue to assess barriers to discharge/monitor patient progress toward functional and medical goals.  Function:  Bathing Bathing position Bathing activity did not occur: Refused Position: Production manager parts bathed by patient: Left arm, Chest, Abdomen, Front perineal area, Right upper leg, Left upper leg, Left lower leg, Buttocks, Right lower leg, Right arm Body parts bathed by helper: Back  Bathing assist Assist Level: Touching or steadying assistance(Pt > 75%)      Upper Body Dressing/Undressing Upper body dressing   What is the patient wearing?: Pull over shirt/dress     Pull over shirt/dress - Perfomed by patient: Thread/unthread right sleeve, Put head through opening, Pull shirt over trunk, Thread/unthread left sleeve Pull over shirt/dress - Perfomed by helper: Thread/unthread left sleeve        Upper body assist Assist Level: Supervision or verbal cues   Set up : To obtain clothing/put away  Lower Body Dressing/Undressing Lower body dressing   What is the patient wearing?: Pants Underwear - Performed by patient: Thread/unthread right underwear leg, Thread/unthread left underwear leg, Pull underwear up/down   Pants- Performed by patient: Thread/unthread right pants leg, Pull pants up/down, Thread/unthread left pants leg  Pants- Performed by helper: Thread/unthread left pants leg Non-skid slipper socks- Performed by patient: Don/doff right sock, Don/doff left sock Non-skid slipper socks- Performed by helper: Don/doff right sock, Don/doff left sock                   Lower body assist Assist for lower body dressing: Touching or steadying assistance (Pt > 75%)      Toileting Toileting Toileting activity did not occur:  (handoff to NT) Toileting steps completed by patient: Adjust clothing prior to toileting Toileting steps completed by helper: Adjust clothing prior to toileting, Performs perineal hygiene, Adjust clothing after toileting Toileting Assistive Devices: Grab bar or rail  Toileting assist Assist level: Touching or steadying assistance (Pt.75%)   Transfers Chair/bed transfer   Chair/bed transfer method: Ambulatory Chair/bed transfer assist level: Touching or steadying assistance (Pt > 75%) Chair/bed transfer assistive device: Armrests, Medical sales representative     Max distance: 150 Assist level: Touching or steadying assistance (Pt > 75%)   Wheelchair          Cognition Comprehension Comprehension assist level: Understands basic 90% of the time/cues < 10% of the time  Expression Expression assist level: Expresses basic 90% of the time/requires cueing < 10% of the time.  Social Interaction Social Interaction assist level: Interacts appropriately 75 - 89% of the time - Needs redirection for appropriate language or to initiate interaction.  Problem Solving Problem solving assist level: Solves basic 90% of the time/requires cueing < 10% of the time  Memory Memory assist level: Recognizes or recalls 75 - 89% of the time/requires cueing 10 - 24% of the time    Medical Problem List and Plan: 1. Left sided weakness, gait abnormalities secondary to right corona radiata infarct on 05/14/15.  Cont CIR, PT, OT, SLP   2. DVT Prophylaxis/Anticoagulation: Pharmaceutical: Lovenox 3. Pain Management: muscle rub for right shoulder  -reviewed need to use good form and the potential for overuse of the RUE. Reviewed ROM exercises 4. Mood: LCSW to follow for evaluation and support.  5. Neuropsych: This patient is not fully capable  of making decisions on his own behalf. 6. Skin/Wound Care: Routine pressure relief measures. Maintain adequate nutritional and hydration status 7. Fluids/Electrolytes/Nutrition: Monitor I/O.   BMP within acceptable range at 5/15 8. T2DM: Monitor BS ac/hs. Resumed metformin 500 mg bid and trajenta 5 mg daily.   Remains fairly well controlled  CBG (last 3)   Recent Labs  05/25/15 1128 05/25/15 1640 05/25/15 2033  GLUCAP 94 142* 123*     Will monitor with increased activity and make adjustments if necessary 9. Dyslipidemia: on Lipitor daily.  10. HTN: Monitor BP.  Hold lisinopril to allow for adequate perfusion. Resume lisinopril if BP trends upwards. (still no indication yet)  11. Leukocytosis: afebrile,   WBCs 11.7 on 5/15.   UA negative, Uculture with multispecies on 5/11- recollection also show multi species, no need to reculture unless more symptomatic, febrile  CXR reviewed from 5/16, relatively unremarkable 12. Baseline dementia  Namenda started on 5/17  Will monitor for improvement, although this may take a few weeks 13. Spasticity in left elbow flexors  Baclofen 5 started on 5/18  LOS (Days) 9 A FACE TO FACE EVALUATION WAS PERFORMED  Charlett Blake 05/26/2015 6:36 AM

## 2015-05-26 NOTE — Progress Notes (Signed)
Speech Language Pathology Daily Session Note  Patient Details  Name: Billy Torres MRN: [id number] Date of Birth: 01-23-1955  Today's Date: 05/26/2015 SLP Individual Time: 1445-1515 SLP Individual Time Calculation (min): 30 min  Short Term Goals: Week 2: SLP Short Term Goal 1 (Week 2): Patient will recall new, daily information with supervision verbal cues.  SLP Short Term Goal 2 (Week 2): Patient will demonstrate complex problem solving for mildly complex and familiar tasks with supervision verbal and question cues.  SLP Short Term Goal 3 (Week 2): Patient will demonstrate alternating attention between two tasks  for 10 minutes with supervision verbal cues for redirection.  SLP Short Term Goal 4 (Week 2): Patient will self-monitor and correct errors with functional tasks with supervision question cues.   Skilled Therapeutic Interventions: Skilled treatment session focused on cognition goals. SLP facilitated goals by providing Min A verbal cues for simple math calculations and for complex problem solving for mildly complex and familiar tasks. Pt required Min A verbal cues to self-monitor and double check work. Even with mod A verbal cues to self monitor, pt was not able to self-correct errors made.  Pt alternated attention between two tasks for 15 minutes with supervision cues. Pt was left in wheelchair with all needs within reach. Continue current plan of care.   Function:  Cognition Comprehension Comprehension assist level: Understands basic 90% of the time/cues < 10% of the time  Expression   Expression assist level: Expresses basic 90% of the time/requires cueing < 10% of the time.  Social Interaction Social Interaction assist level: Interacts appropriately 75 - 89% of the time - Needs redirection for appropriate language or to initiate interaction.  Problem Solving Problem solving assist level: Solves basic 90% of the time/requires cueing < 10% of the time  Memory Memory assist  level: Recognizes or recalls 75 - 89% of the time/requires cueing 10 - 24% of the time    Pain    Therapy/Group: Individual Therapy  Asako Saliba 05/26/2015, 4:24 PM

## 2015-05-26 NOTE — Progress Notes (Signed)
Occupational Therapy Session Note  Patient Details  Name: Billy Torres MRN: [id number] Date of Birth: [birth year]  Today's Date: 05/26/2015 OT Individual Time:  -   0900-0930  (30 min)      Short Term Goals: Week 1:  OT Short Term Goal 1 (Week 1): STGs=LTGs secondary to short estimated LOS Week 2:  OT Short Term Goal 1 (Week 2): STGs=LTGs secondary to upcoming discharge Week 3:     Skilled Therapeutic Interventions/Progress Updates:    Upon entering the room, pt seated in recliner chair awaiting therapist. Pt declined shower but requesting to change clothing items. Pt ambulate to barthroom with 2 LOB during standing and rotating.  Pt ambulated with cues for slow and heel toe stride.  Pt urinated and donned / doffed pants and  shirt.  Ambulated back to wc with min assist.  Provided mod cues for  forced of L UE during this session..  Pt used LUE as active assist with mod cues.   Pt remained seated in wc at end of session with call bell and all needed items within reach upon exiting the room.  Therapy Documentation Precautions:  Precautions Precautions: Fall Precaution Comments: L hemiparesis, L lean in sitting and with ambulation Restrictions Weight Bearing Restrictions: No General:   Vital Signs: Therapy Vitals Temp: 98.3 F (36.8 C) Temp Source: Oral Pulse Rate: 88 Resp: 17 BP: 130/73 mmHg Patient Position (if appropriate): Lying Oxygen Therapy SpO2: 97 % O2 Device: Not Delivered Pain:none    See Function Navigator for Current Functional Status.   Therapy/Group: Individual Therapy  Lisa Roca 05/26/2015, 7:51 AM

## 2015-05-26 NOTE — Progress Notes (Signed)
Occupational Therapy Session Note  Patient Details  Name: Billy Torres MRN: [id number] Date of Birth: June 20, 1955  Today's Date: 05/26/2015 OT Individual Time: 1000-1100 OT Individual Time Calculation (min): 60 min    Short Term Goals: Week 2:  OT Short Term Goal 1 (Week 2): STGs=LTGs secondary to upcoming discharge  Skilled Therapeutic Interventions/Progress Updates:  Upon entering the room, pt seated in wheelchair with no c/o pain. Prior to therapist arrival, pt had been violently ill and vomiting on self. RN and NT changed clothing prior to OT arrival. However, pt requesting to wash secondary to this incident. Pt ambulating to bathroom with steady assist and min cues for RW advancement and safety for shower transfer onto TTB. Pt bathing from bench and standing once with steady assist to wash buttocks and peri area. Pt bathing parts of body with L UE while wearing bath mit with forced use of UE. R UE utilized for bathing as well for thoroughness.  Once shower complete, pt standing to exit and begins to urinate all over the floor. OT cleaned floor in order to decrease fall risk. Pt returning to wheelchair for dressing tasks. Pt donning B socks with R UE this session. Pt standing at sink with L UE placed into weight bearing position while pt performed grooming tasks at sink. Pt returned to wheelchair at end of session with call bell and all needed items within reach upon exiting the room.   Therapy Documentation Precautions:  Precautions Precautions: Fall Precaution Comments: L hemiparesis, L lean in sitting and with ambulation Restrictions Weight Bearing Restrictions: No  See Function Navigator for Current Functional Status.   Therapy/Group: Individual Therapy  Phineas Semen 05/26/2015, 11:12 AM

## 2015-05-27 ENCOUNTER — Inpatient Hospital Stay (HOSPITAL_COMMUNITY): Payer: Self-pay | Admitting: Physical Therapy

## 2015-05-27 LAB — GLUCOSE, CAPILLARY
GLUCOSE-CAPILLARY: 136 mg/dL — AB (ref 65–99)
GLUCOSE-CAPILLARY: 97 mg/dL (ref 65–99)
Glucose-Capillary: 94 mg/dL (ref 65–99)
Glucose-Capillary: 117 mg/dL — ABNORMAL HIGH (ref 65–99)

## 2015-05-27 NOTE — Progress Notes (Signed)
Physical Therapy Session Note  Patient Details  Name: Billy Torres MRN: [id number] Date of Birth: 12/21/1955  Today's Date: 05/27/2015 PT Individual Time: 0915-1000 PT Individual Time Calculation (min): 45 min   Short Term Goals: Week 2:  PT Short Term Goal 1 (Week 2): =LTGs due to ELOS  Skilled Therapeutic Interventions/Progress Updates:    Pt received resting in bed with no c/o pain and agreeable to therapy session.  Session focus on balance during ADLs, problem solving, gait, and transfers.  Pt ambulates within room with min/mod assist for balance requiring increasing assist with navigation in tight spaces in order to gather clothing from drawer.  Pt requesting to toilet.  Toileting with overall steady assist, pt able to perform hygiene and clothing management as PT provides steady assist.  Dynamic standing balance standing at sink for hand hygiene with close supervision for safety and mod verbal cues when backing away from sink.  Dressing from w/c level with pt able to perform LB dressing with sit<>stands from w/c with steady assist for balance when managing clothing.  Gait training to ortho gym with steady assist and pt performed car transfer at simulated sedan height with steady assist for balance.  PT provided education on LLE awareness and taking bigger steps when turning in a clockwise direction and adjusting feet to be hip width apart before attempting to remove LUE from RW and sit.  Pt verbalized understanding, but no carryover noted in later opportunities during session.  PT instructed pt in gait on compliant surfaces x10' with RW and steady assist.  Pt returned to room at end of session and positioned upright in w/c with call bell in reach and needs met.   Therapy Documentation Precautions:  Precautions Precautions: Fall Precaution Comments: L hemiparesis, L lean in sitting and with ambulation Restrictions Weight Bearing Restrictions: No Pain: Pain Assessment Pain Assessment:  No/denies pain   See Function Navigator for Current Functional Status.   Therapy/Group: Individual Therapy  [name] 05/27/2015, 12:11 PM

## 2015-05-27 NOTE — Progress Notes (Signed)
Patient ID: Billy Torres, male   DOB: [birth year], 60 y.o.   MRN: [id number]  05/27/15.  [facility]     PROGRESS NOTE  Subjective/Complaints:   60 y/o admit for CIR with functional deficits secondary to right corona radiata infarct  No issues overnite, watching TV in bed. Ambulatory with walker  ROS: Denies left shoulder pain, CP, SOB, nausea, vomiting, diarrhea.  Objective: Vital Signs: Blood pressure 127/74, pulse 88, temperature 98.5 F (36.9 C), temperature source Oral, resp. rate 18, height 5\' 8"  (1.727 m), weight 145 lb 11.6 oz (66.1 kg), SpO2 98 %. No results found. No results for input(s): WBC, HGB, HCT, PLT in the last 72 hours. No results for input(s): NA, K, CL, GLUCOSE, BUN, CREATININE, CALCIUM in the last 72 hours.  Invalid input(s): CO CBG (last 3)   Recent Labs  05/26/15 1631 05/26/15 2036 05/27/15 0636  GLUCAP 160* 108* 94    Wt Readings from Last 3 Encounters:  05/27/15 145 lb 11.6 oz (66.1 kg)  05/14/15 149 lb (67.586 kg)  05/12/15 149 lb (67.586 kg)    Physical Exam:  BP 127/74 mmHg  Pulse 88  Temp(Src) 98.5 F (36.9 C) (Oral)  Resp 18  Ht 5\' 8"  (1.727 m)  Wt 145 lb 11.6 oz (66.1 kg)  BMI 22.16 kg/m2  SpO2 98% Constitutional: He appears well-developed and well-nourished. No distress. Vital signs reviewed. HENT: Normocephalic and atraumatic. Dentures in place   Eyes: Conjunctivae and EOM are normal.  Respiratory: Effort normal and breath sounds normal. No stridor. No respiratory distress. He has no wheezes.  GI: Soft. Bowel sounds are normal. He exhibits no distension. There is no tenderness.  Musculoskeletal: No edema. No tenderness. Kinesiotaping to left upper extremity.  Neurological: He is alert and oriented.  Mild left sided weakness Skin: Skin is warm and dry. He is not diaphoretic.  Psychiatric: He has a normal mood and affect. His behavior is normal. Thought content normal   Medical Problem  List and Plan: 1. Left sided weakness, gait abnormalities secondary to right corona radiata infarct on 05/14/15.  Cont CIR, PT, OT, SLP   2. DVT Prophylaxis/Anticoagulation: Pharmaceutical: Lovenox  3. T2DM: Monitor BS ac/hs. Resumed metformin 500 mg bid and trajenta 5 mg daily.   Remains fairly well controlled  CBG (last 3)   Recent Labs  05/26/15 1631 05/26/15 2036 05/27/15 0636  GLUCAP 160* 108* 94     Will monitor with increased activity and make adjustments if necessary  4. HTN: Monitor BP.  Hold lisinopril to allow for adequate perfusion. Resume lisinopril if BP trends upwards. (still no indication yet)    LOS (Days) 10 A FACE TO FACE EVALUATION WAS PERFORMED  Nyoka Cowden 05/27/2015 8:41 AM

## 2015-05-28 LAB — GLUCOSE, CAPILLARY
GLUCOSE-CAPILLARY: 108 mg/dL — AB (ref 65–99)
GLUCOSE-CAPILLARY: 117 mg/dL — AB (ref 65–99)
Glucose-Capillary: 147 mg/dL — ABNORMAL HIGH (ref 65–99)
Glucose-Capillary: 148 mg/dL — ABNORMAL HIGH (ref 65–99)

## 2015-05-28 NOTE — Progress Notes (Signed)
Patient ID: PAYTIN WIERSEMA, male   DOB: 1955-11-22, 60 y.o.   MRN: [id number]   Patient ID: RAWSON RUPINSKI, male   DOB: 28-Apr-1955, 60 y.o.   MRN: [id number]  05/28/15.   PHYSICAL MEDICINE & REHABILITATION     PROGRESS NOTE  Subjective/Complaints:   60 y/o admit for CIR with functional deficits secondary to right corona radiata infarct  No issues overnite, watching TV in bed. Ambulatory with walker. Remains stable without concerns or complaints.  ROS: Denies left shoulder pain, CP, SOB, nausea, vomiting, diarrhea.  Objective: Vital Signs: Blood pressure 111/60, pulse 81, temperature 98.2 F (36.8 C), temperature source Oral, resp. rate 17, height 5\' 8"  (1.727 m), weight 145 lb 11.6 oz (66.1 kg), SpO2 98 %. No results found. No results for input(s): WBC, HGB, HCT, PLT in the last 72 hours. No results for input(s): NA, K, CL, GLUCOSE, BUN, CREATININE, CALCIUM in the last 72 hours.  Invalid input(s): CO CBG (last 3)   Recent Labs  05/27/15 1637 05/27/15 2053 05/28/15 0645  GLUCAP 97 117* 108*    Wt Readings from Last 3 Encounters:  05/27/15 145 lb 11.6 oz (66.1 kg)  05/14/15 149 lb (67.586 kg)  05/12/15 149 lb (67.586 kg)   BP Readings from Last 3 Encounters:  05/28/15 111/60  05/17/15 131/86  05/12/15 137/74   Physical Exam:  BP 111/60 mmHg  Pulse 81  Temp(Src) 98.2 F (36.8 C) (Oral)  Resp 17  Ht 5\' 8"  (1.727 m)  Wt 145 lb 11.6 oz (66.1 kg)  BMI 22.16 kg/m2  SpO2 98% Constitutional: He appears well-developed and well-nourished. No distress. Vital signs reviewed. HENT: Normocephalic and atraumatic. Dentures in place   Eyes: Conjunctivae and EOM are normal.  Respiratory: Effort normal and breath sounds normal. No stridor. No respiratory distress. He has no wheezes.  GI: Soft. Bowel sounds are normal. He exhibits no distension. There is no tenderness.  Musculoskeletal: No edema. No tenderness. Kinesiotaping to left upper extremity.  Neurological:  He is alert and oriented.  Mild left sided weakness Skin: Skin is warm and dry. He is not diaphoretic.  Psychiatric: He has a normal mood and affect. His behavior is normal. Thought content normal   Medical Problem List and Plan: 1. Left sided weakness, gait abnormalities secondary to right corona radiata infarct on 05/14/15.  Cont CIR, PT, OT, SLP   2. DVT Prophylaxis/Anticoagulation: Pharmaceutical: Lovenox  3. T2DM: Monitor BS ac/hs. Resumed metformin 500 mg bid and trajenta 5 mg daily.   Remains fairly well controlled  CBG (last 3)   Recent Labs  05/27/15 1637 05/27/15 2053 05/28/15 0645  GLUCAP 97 117* 108*     Will monitor with increased activity and make adjustments if necessary  4. HTN: Monitor BP.  Hold lisinopril to allow for adequate perfusion. Resume lisinopril if BP trends upwards. (still no indication yet)    LOS (Days) 11 A FACE TO FACE EVALUATION WAS PERFORMED  Nyoka Cowden 05/28/2015 8:21 AM

## 2015-05-29 ENCOUNTER — Inpatient Hospital Stay (HOSPITAL_COMMUNITY): Payer: Self-pay | Admitting: Physical Therapy

## 2015-05-29 ENCOUNTER — Inpatient Hospital Stay (HOSPITAL_COMMUNITY): Payer: Medicaid Other | Admitting: Speech Pathology

## 2015-05-29 ENCOUNTER — Inpatient Hospital Stay (HOSPITAL_COMMUNITY): Payer: Medicaid Other | Admitting: Occupational Therapy

## 2015-05-29 LAB — BASIC METABOLIC PANEL
ANION GAP: 10 (ref 5–15)
BUN: 15 mg/dL (ref 6–20)
CHLORIDE: 98 mmol/L — AB (ref 101–111)
CO2: 29 mmol/L (ref 22–32)
Calcium: 10 mg/dL (ref 8.9–10.3)
Creatinine, Ser: 1.12 mg/dL (ref 0.61–1.24)
GFR calc non Af Amer: >60 mL/min (ref >60)
GLUCOSE: 84 mg/dL (ref 65–99)
POTASSIUM: 3.8 mmol/L (ref 3.5–5.1)
Sodium: 137 mmol/L (ref 135–145)

## 2015-05-29 LAB — GLUCOSE, CAPILLARY
GLUCOSE-CAPILLARY: 129 mg/dL — AB (ref 65–99)
GLUCOSE-CAPILLARY: 105 mg/dL — AB (ref 65–99)
Glucose-Capillary: 99 mg/dL (ref 65–99)
Glucose-Capillary: 84 mg/dL (ref 65–99)

## 2015-05-29 LAB — CBC
HEMATOCRIT: 45.6 % (ref 39.0–52.0)
HEMOGLOBIN: 14.9 g/dL (ref 13.0–17.0)
MCH: 27.1 pg (ref 26.0–34.0)
MCHC: 32.7 g/dL (ref 30.0–36.0)
MCV: 82.9 fL (ref 78.0–100.0)
Platelets: 293 10*3/uL (ref 150–400)
RBC: 5.5 MIL/uL (ref 4.22–5.81)
RDW: 12.3 % (ref 11.5–15.5)
WBC: 9.9 10*3/uL (ref 4.0–10.5)

## 2015-05-29 NOTE — Progress Notes (Signed)
Speech Language Pathology Daily Session Note  Patient Details  Name: Billy Torres MRN: [id number] Date of Birth: 10/02/1955  Today's Date: 05/29/2015 SLP Individual Time: 0930-1000 SLP Individual Time Calculation (min): 30 min  Short Term Goals: Week 2: SLP Short Term Goal 1 (Week 2): Patient will recall new, daily information with supervision verbal cues.  SLP Short Term Goal 2 (Week 2): Patient will demonstrate complex problem solving for mildly complex and familiar tasks with supervision verbal and question cues.  SLP Short Term Goal 3 (Week 2): Patient will demonstrate alternating attention between two tasks  for 10 minutes with supervision verbal cues for redirection.  SLP Short Term Goal 4 (Week 2): Patient will self-monitor and correct errors with functional tasks with supervision question cues.   Skilled Therapeutic Interventions: Pt seen to address cognitive-linguistic goals. Pt completed functional money management task with 90% acc mod I and 100% with min A for supervision question cues. Pt benefited from additional time for processing. Improved affect and interaction this date.   Function:  Eating Eating   Modified Consistency Diet: No Eating Assist Level: Set up assist for   Eating Set Up Assist For: Opening containers       Cognition Comprehension Comprehension assist level: Follows basic conversation/direction with no assist  Expression   Expression assist level: Expresses basic 90% of the time/requires cueing < 10% of the time.  Social Interaction Social Interaction assist level: Interacts appropriately with others with medication or extra time (anti-anxiety, antidepressant).  Problem Solving Problem solving assist level: Solves basic 75 - 89% of the time/requires cueing 10 - 24% of the time  Memory Memory assist level: Recognizes or recalls 75 - 89% of the time/requires cueing 10 - 24% of the time    Pain Pain Assessment Pain Assessment: No/denies  pain  Therapy/Group: Individual Therapy  Vinetta Bergamo MA, CCC-SLP 05/29/2015, 3:46 PM

## 2015-05-29 NOTE — NC FL2 (Signed)
[place] LEVEL OF CARE SCREENING TOOL     IDENTIFICATION  Patient Name: Billy Torres: 05/21/1955 Sex: male Admission Date (Current Location): 05/17/2015  [facility] and [place] Number:  Herbalist and Address:  [facility] [place] [address] [place], [place] [address]      Provider Number: [id number]  Attending Physician Name and Address:  Ankit Lorie Phenix, MD  Relative Name and Phone Number:  Larkin Ina, son, [phone]    Current Level of Care: Other (Comment) (Inpatient Rehabilitation) Recommended Level of Care: [facility] Prior Approval Number:    Date Approved/Denied:   PASRR Number: [id number]  Discharge Plan: SNF    Current Diagnoses: Patient Active Problem List   Diagnosis Date Noted  . Muscle spasticity   . Dementia   . Cerebral infarction due to unspecified mechanism   . Leukocytosis   . Acute thrombotic stroke ([place]) 05/17/2015  . Diabetes mellitus due to underlying condition with complication, without long-term current use of insulin ([place])   . Gait disturbance, post-stroke   . Hemiparesis ([place])   . Cognitive deficit due to old cerebral infarction   . Benign essential HTN   . Diabetes mellitus type 2 in nonobese (HCC)   . Essential hypertension   . History of CVA (cerebrovascular accident) without residual deficits   . Ataxia, post-stroke   . Dysarthria, post-stroke   . Tobacco abuse   . HLD (hyperlipidemia)   . Acute CVA (cerebrovascular accident) ([place]) 05/15/2015  . Left-sided weakness 05/14/2015  . Right shoulder pain 03/03/2014  . History of CVA (cerebrovascular accident) 02/28/2014  . Smoking 06/29/2013  . CVA (cerebral infarction) 06/29/2013  . Dyslipidemia 06/29/2013  . TIA (transient ischemic attack) 06/27/2013  . Diabetes ([place]) 06/27/2013    Orientation RESPIRATION BLADDER Height & Weight     Self, Time, Situation, Place  Normal Continent Weight: 66.1 kg (145 lb  11.6 oz) Height:  5\' 8"  (172.7 cm)  BEHAVIORAL SYMPTOMS/MOOD NEUROLOGICAL BOWEL NUTRITION STATUS      Continent Diet (carb modified)  AMBULATORY STATUS COMMUNICATION OF NEEDS Skin   Limited Assist Verbally Normal                       Personal Care Assistance Level of Assistance  Bathing, Dressing Bathing Assistance: Limited assistance Feeding assistance: Independent Dressing Assistance: Limited assistance     Functional Limitations Info             SPECIAL CARE FACTORS FREQUENCY  PT (By licensed PT), OT (By licensed OT), Speech therapy     PT Frequency: 5x/wk OT Frequency: 5x.wk     Speech Therapy Frequency: 5x/wk      Contractures      Additional Factors Info  Insulin Sliding Scale Code Status Info: full Allergies Info: NKA   Insulin Sliding Scale Info: insulin aspart 0-15 units       Current Medications (05/29/2015):  This is the current hospital active medication list Current Facility-Administered Medications  Medication Dose Route Frequency Provider Last Rate Last Dose  . acetaminophen (TYLENOL) tablet 325-650 mg  325-650 mg Oral Q4H PRN Bary Leriche, PA-C   650 mg at 05/25/15 0836  . acetaminophen (TYLENOL) tablet 650 mg  650 mg Oral Q6H PRN Bary Leriche, PA-C   650 mg at 05/29/15 [id number]  . alum & mag hydroxide-simeth (MAALOX/MYLANTA) 200-200-20 MG/5ML suspension 30 mL  30 mL Oral Q4H PRN Bary Leriche, PA-C      .  aspirin EC tablet 81 mg  81 mg Oral Daily Bary Leriche, PA-C   81 mg at 05/29/15 0846  . atorvastatin (LIPITOR) tablet 80 mg  80 mg Oral Daily Bary Leriche, PA-C   80 mg at 05/29/15 0846  . baclofen (LIORESAL) tablet 5 mg  5 mg Oral TID Ankit Lorie Phenix, MD   5 mg at 05/29/15 1323  . bisacodyl (DULCOLAX) EC tablet 5 mg  5 mg Oral Daily PRN Bary Leriche, PA-C   5 mg at 05/24/15 1237  . clopidogrel (PLAVIX) tablet 75 mg  75 mg Oral Q breakfast Bary Leriche, PA-C   75 mg at 05/29/15 0846  . diphenhydrAMINE (BENADRYL) 12.5 MG/5ML elixir  12.5-25 mg  12.5-25 mg Oral Q6H PRN Ivan Anchors Love, PA-C      . enoxaparin (LOVENOX) injection 40 mg  40 mg Subcutaneous Q24H Pamela S Love, PA-C   40 mg at 05/29/15 [id number]  . guaiFENesin-dextromethorphan (ROBITUSSIN DM) 100-10 MG/5ML syrup 5-10 mL  5-10 mL Oral Q6H PRN Bary Leriche, PA-C      . insulin aspart (novoLOG) injection 0-15 Units  0-15 Units Subcutaneous TID WC Bary Leriche, PA-C   2 Units at 05/28/15 1728  . linagliptin (TRADJENTA) tablet 5 mg  5 mg Oral Daily Bary Leriche, PA-C   5 mg at 05/29/15 0846  . memantine (NAMENDA) tablet 5 mg  5 mg Oral Daily Ankit Lorie Phenix, MD   5 mg at 05/29/15 0846  . metFORMIN (GLUCOPHAGE) tablet 500 mg  500 mg Oral BID WC Ivan Anchors Love, PA-C   500 mg at 05/29/15 0846  . MUSCLE RUB CREA   Topical PRN Meredith Staggers, MD      . prochlorperazine (COMPAZINE) tablet 5-10 mg  5-10 mg Oral Q6H PRN Bary Leriche, PA-C   10 mg at 05/22/15 2120   Or  . prochlorperazine (COMPAZINE) injection 5-10 mg  5-10 mg Intramuscular Q6H PRN Bary Leriche, PA-C   5 mg at 05/19/15 2232   Or  . prochlorperazine (COMPAZINE) suppository 12.5 mg  12.5 mg Rectal Q6H PRN Bary Leriche, PA-C      . senna-docusate (Senokot-S) tablet 1 tablet  1 tablet Oral QHS PRN Bary Leriche, PA-C      . sodium phosphate (FLEET) 7-19 GM/118ML enema 1 enema  1 enema Rectal Once PRN Pamela S Love, PA-C      . traZODone (DESYREL) tablet 25-50 mg  25-50 mg Oral QHS PRN Bary Leriche, PA-C         Discharge Medications: Please see discharge summary for a list of discharge medications.  Relevant Imaging Results:  Relevant Lab Results:   Additional Information SSN:  [place]  Lynford Espinoza, LCSW

## 2015-05-29 NOTE — Progress Notes (Signed)
[facility]IC[facility] & RE[facility]ABILITATION     PROGRESS NOTE  Subjective/Complaints:  Pt seen sitting up in his recliner this AM.  He slept well overnight and states that his hand continues to improve.    ROS: Denies left shoulder pain, CP, SOB, nausea, vomiting, diarrhea.  Objective: Vital Signs: Blood pressure 108/60, pulse 78, temperature 98.2 F (36.8 C), temperature source Oral, resp. rate 17, height 5\' 8"  (1.727 m), weight 66.1 kg (145 lb 11.6 oz), SpO2 99 %. No results found. No results for input(s): WBC, HGB, HCT, PLT in the last 72 hours. No results for input(s): NA, K, CL, GLUCOSE, BUN, CREATININE, CALCIUM in the last 72 hours.  Invalid input(s): CO CBG (last 3)   Recent Labs  05/28/15 1649 05/28/15 2045 05/29/15 0643  GLUCAP 148* 117* 99    Wt Readings from Last 3 Encounters:  05/27/15 66.1 kg (145 lb 11.6 oz)  05/14/15 67.586 kg (149 lb)  05/12/15 67.586 kg (149 lb)    Physical Exam:  BP 108/60 mmHg  Pulse 78  Temp(Src) 98.2 F (36.8 C) (Oral)  Resp 17  Ht 5\' 8"  (1.727 m)  Wt 66.1 kg (145 lb 11.6 oz)  BMI 22.16 kg/m2  SpO2 99% Constitutional: He appears well-developed and well-nourished. No distress. Vital signs reviewed. HENT: Normocephalic and atraumatic.  Eyes: Conjunctivae and EOM are normal.  Respiratory: Effort normal and breath sounds normal. No stridor. No respiratory distress. He has no wheezes.  GI: Soft. Bowel sounds are normal. He exhibits no distension. There is no tenderness.  Musculoskeletal: No edema. No tenderness. Kinesiotaping to left upper extremity.  Neurological: He is alert and oriented.  Mild dysarthria with mild left facial weakness.  Able to follow simple motor commands without difficulty. Has poor insight and lacks awareness of deficits, slightly improved Motor: Left upper extremity: Shoulder abduction 4/5, elbow flexion/extension 4/5, hand grip 4-/5   MAS 1/4 elbow flexors  Left lower extremity and 4+/5 proximal to  distal, increased extensor tone Right upper/right lower extremity 5/5 proximal to distal  Skin: Skin is warm and dry. He is not diaphoretic.  Psychiatric: He has a normal mood and affect. His behavior is normal. Thought content normal   Assessment/Plan: 1. Functional deficits secondary to right corona radiata infarct which require 3+ hours per day of interdisciplinary therapy in a comprehensive inpatient rehab setting. Physiatrist is providing close team supervision and 24 hour management of active medical problems listed below. Physiatrist and rehab team continue to assess barriers to discharge/monitor patient progress toward functional and medical goals.  Function:  Bathing Bathing position Bathing activity did not occur: Refused Position: Production manager parts bathed by patient: Left arm, Chest, Abdomen, Front perineal area, Right upper leg, Left upper leg, Left lower leg, Buttocks, Right lower leg, Right arm Body parts bathed by helper: Back  Bathing assist Assist Level: Touching or steadying assistance(Pt > 75%)      Upper Body Dressing/Undressing Upper body dressing   What is the patient wearing?: Pull over shirt/dress     Pull over shirt/dress - Perfomed by patient: Thread/unthread right sleeve, Thread/unthread left sleeve, Put head through opening, Pull shirt over trunk Pull over shirt/dress - Perfomed by helper: Thread/unthread left sleeve        Upper body assist Assist Level: More than reasonable time   Set up : To obtain clothing/put away  Lower Body Dressing/Undressing Lower body dressing   What is the patient wearing?: Underwear, Pants Underwear - Performed by  patient: Thread/unthread right underwear leg, Thread/unthread left underwear leg, Pull underwear up/down   Pants- Performed by patient: Thread/unthread right pants leg, Thread/unthread left pants leg, Pull pants up/down Pants- Performed by helper: Pull pants up/down Non-skid slipper socks-  Performed by patient: Don/doff right sock, Don/doff left sock Non-skid slipper socks- Performed by helper: Don/doff right sock, Don/doff left sock                  Lower body assist Assist for lower body dressing: Touching or steadying assistance (Pt > 75%)      Toileting Toileting Toileting activity did not occur:  (handoff to NT) Toileting steps completed by patient: Adjust clothing prior to toileting, Performs perineal hygiene, Adjust clothing after toileting Toileting steps completed by helper: Adjust clothing prior to toileting, Performs perineal hygiene, Adjust clothing after toileting Toileting Assistive Devices: Grab bar or rail  Toileting assist Assist level: More than reasonable time, Supervision or verbal cues   Transfers Chair/bed transfer   Chair/bed transfer method: Stand pivot, Ambulatory Chair/bed transfer assist level: Touching or steadying assistance (Pt > 75%) Chair/bed transfer assistive device: Armrests, Medical sales representative     Max distance: 150 Assist level: Touching or steadying assistance (Pt > 75%)   Wheelchair          Cognition Comprehension Comprehension assist level: Understands basic 75 - 89% of the time/ requires cueing 10 - 24% of the time  Expression Expression assist level: Expresses basic 90% of the time/requires cueing < 10% of the time.  Social Interaction Social Interaction assist level: Interacts appropriately 75 - 89% of the time - Needs redirection for appropriate language or to initiate interaction.  Problem Solving Problem solving assist level: Solves basic 75 - 89% of the time/requires cueing 10 - 24% of the time  Memory Memory assist level: Recognizes or recalls 75 - 89% of the time/requires cueing 10 - 24% of the time    Medical Problem List and Plan: 1. Left sided weakness, gait abnormalities secondary to right corona radiata infarct on 05/14/15.  Cont CIR 2. DVT Prophylaxis/Anticoagulation: Pharmaceutical:  Lovenox 3. Pain Management: muscle rub for right shoulder. Reviewed ROM exercises 4. Mood: LCSW to follow for evaluation and support.  5. Neuropsych: This patient is not fully capable of making decisions on his own behalf. 6. Skin/Wound Care: Routine pressure relief measures. Maintain adequate nutritional and hydration status 7. Fluids/Electrolytes/Nutrition: Monitor I/O.   BMP within acceptable range at 5/15  Labs ordered for today 8. T2DM: Monitor BS ac/hs. Resumed metformin 500 mg bid and trajenta 5 mg daily.   Remains fairly well controlled  CBG (last 3)   Recent Labs  05/28/15 1649 05/28/15 2045 05/29/15 0643  GLUCAP 148* 117* 99   Will monitor with increased activity and make adjustments if necessary 9. Dyslipidemia: on Lipitor daily.  10. HTN: Monitor BP.  Hold lisinopril to allow for adequate perfusion. Resume lisinopril if BP trends upwards. (still no indication yet)  11. Leukocytosis: afebrile,   WBCs 11.7 on 5/15.   UA negative, Uculture with multispecies on 5/11- recollection also show multi species, no need to reculture unless more symptomatic, febrile  CXR reviewed from 5/16, relatively unremarkable 12. Baseline dementia  Namenda started on 5/17  Will monitor for improvement, although this may take a few weeks 13. Spasticity in left elbow flexors  Baclofen 5 started on 5/18  LOS (Days) 12 A FACE TO FACE EVALUATION WAS PERFORMED  Ankit Lorie Phenix 05/29/2015 8:41 AM

## 2015-05-29 NOTE — Progress Notes (Signed)
Occupational Therapy Session Note  Patient Details  Name: PER BEAGLEY MRN: [id number] Date of Birth: [birth year]  Today's Date: 05/29/2015 OT Individual Time: 1115-1200 OT Individual Time Calculation (min): 45 min    Short Term Goals: No short term goals set  Skilled Therapeutic Interventions/Progress Updates:    Pt seen for skilled OT to facilitate postural control in standing and active use of LUE.  Pt agreeable to shower. Pt used RW to ambulate to toilet. He was sitting in the recliner and did need touching A to come to stand to RW. Steadying A to toilet with slight more A to transfer into shower. As pt was stepping to shower, pt was cued to move left. He initiated movement with his shoulder but not with his leg so he "fell" towards the wall with this clinician stabilizing him. Pt sat on tub bench and was then able to shift around with S. During shower pt used his LUE with min cues, did not use shower mit today as pt was able to grasp the washcloth himself. Pt crossed his feet over knees to wash them, but when crossing L over R knee, he had LOB in sitting to the L with min A to recover. Pt ambulated out of bathroom with RW with cues to fully advance LLE before advancing the RW. Pt dressed self with close S to steadying A in standing. Good use of LUE in dressing with sh Flex AROM improved to 120 degrees.  Pt in recliner at end of session with all needs met.   Therapy Documentation Precautions:  Precautions Precautions: Fall Precaution Comments: L hemiparesis, L lean in sitting and with ambulation Restrictions Weight Bearing Restrictions: No     Pain: Pain Assessment Pain Assessment: No/denies pain   ADL:  See Function Navigator for Current Functional Status.   Therapy/Group: Individual Therapy  Shyloh Krinke 05/29/2015, 2:31 PM

## 2015-05-29 NOTE — Progress Notes (Signed)
Physical Therapy Session Note  Patient Details  Name: Billy Torres MRN: [id number] Date of Birth: 1955-08-28  Today's Date: 05/29/2015 PT Individual Time: [phone] PT Individual Time Calculation (min): 58 min   Short Term Goals: Week 2:  PT Short Term Goal 1 (Week 2): =LTGs due to ELOS  Skilled Therapeutic Interventions/Progress Updates:    Pt received resting upright in recliner with no c/o pain and agreeable to therapy session.  Session focus on LUE/LLE NMR, gait training with RW, and transfers.    Pt continues to transfer sit<>stand with supervision and occasional verbal cues for correct sequencing of hand splint don/doff.  Pt continues to require steady assist for pivot or ambulatory transfers due to consistently short L step length.    Gait training to/from therapy gym with RW with steady assist overall, occasional bouts of close supervision and occasional mod assist for LOB to L with short L step length.    NMR in therapy gym on floor mat in tall kneeling with isometric hold x5 minutes while pt attempted to complete beetle puzzle for cognitive challenge.  Pt able to match 1 set of beetles in 5 minutes with demonstration from PT.  NMR in tall kneeling focus on R weight shift and L hip extension x10 reps with initial mod facilitation for weight shift fade to min facilitation and mod verbal cues.  Pt transitioned from floor>therapy mat with mod assist to rise from floor 2/2 LLE fatigue.  LUE NMR with sponge squeezes until fatigued (required verbal cues to only squeeze with the L hand and not use R hand to help), and elbow flexion until fatigued focus on decreasing compensations.    Pt amb back to room in same manner as above and positioned upright in recliner with call bell in reach and needs met.   Therapy Documentation Precautions:  Precautions Precautions: Fall Precaution Comments: L hemiparesis, L lean in sitting and with ambulation Restrictions Weight Bearing Restrictions:  No   See Function Navigator for Current Functional Status.   Therapy/Group: Individual Therapy  Billy Torres 05/29/2015, 4:28 PM

## 2015-05-29 NOTE — Progress Notes (Signed)
Physical Therapy Session Note  Patient Details  Name: Billy Torres MRN: [id number] Date of Birth: 04-18-1955  Today's Date: 05/29/2015 PT Individual Time: 1004-1100 PT Individual Time Calculation (min): 56 min   Short Term Goals: Week 2:  PT Short Term Goal 1 (Week 2): =LTGs due to ELOS  Skilled Therapeutic Interventions/Progress Updates:    Pt received resting upright in recliner with no c/o pain and agreeable to therapy session.  Pt noted to have soiled shirt from ? Incontinence and agreeable to toileting and changing into clean shirt.  Pt performed toilet transfers, hygiene, and clothing management with supervision.  One LOB to L with clothing management required min assist to recover.    Pt transfers sit<>stand with supervision and stand/pivot or ambulatory transfers with steady assist and verbal cues for attention to LLE.    Gait training within room (including backing up) and to and from therapy gym with RW and overall steady assist with brief periods of very close supervision. Pt continues to demo poor attention to LLE during ambulation and requires verbal cues for step length and foot clearance.    NMR on therapy mat via forced use for LUE/LLE.  Prone on elbows focus on increased LUE weight bearing to move cones with RUE R<>L across midline.  Quadruped with push ups 2x10 with short rest break in between.  Quadruped with LE lifts x10 reps each side with mod/max verbal cues for technique/L elbow extension and tactile cues for weight shift to R.  Quadruped with UE lifts x10 reps each side with supervision and verbal cues for maintaining L elbow extension when weight bearing.    Pt returned to room at end of session and left upright in recliner with call bell in reach and needs met.    Therapy Documentation Precautions:  Precautions Precautions: Fall Precaution Comments: L hemiparesis, L lean in sitting and with ambulation Restrictions Weight Bearing Restrictions: No   See  Function Navigator for Current Functional Status.   Therapy/Group: Individual Therapy  Vincenza Dail E Penven-Crew 05/29/2015, 10:45 AM

## 2015-05-30 ENCOUNTER — Inpatient Hospital Stay (HOSPITAL_COMMUNITY): Payer: Self-pay | Admitting: Physical Therapy

## 2015-05-30 ENCOUNTER — Inpatient Hospital Stay (HOSPITAL_COMMUNITY): Payer: Medicaid Other | Admitting: Occupational Therapy

## 2015-05-30 ENCOUNTER — Inpatient Hospital Stay (HOSPITAL_COMMUNITY): Payer: Medicaid Other | Admitting: Speech Pathology

## 2015-05-30 LAB — GLUCOSE, CAPILLARY
Glucose-Capillary: 98 mg/dL (ref 65–99)
Glucose-Capillary: 119 mg/dL — ABNORMAL HIGH (ref 65–99)
Glucose-Capillary: 146 mg/dL — ABNORMAL HIGH (ref 65–99)
Glucose-Capillary: 125 mg/dL — ABNORMAL HIGH (ref 65–99)

## 2015-05-30 MED ORDER — ASPIRIN 81 MG PO TBEC: 81.0000 mg | DELAYED_RELEASE_TABLET | Freq: Every day | ORAL | Status: DC

## 2015-05-30 MED ORDER — ACETAMINOPHEN 325 MG PO TABS: 650.0000 mg | ORAL_TABLET | Freq: Four times a day (QID) | ORAL | Status: DC | PRN

## 2015-05-30 MED ORDER — BACLOFEN 10 MG PO TABS: 5.0000 mg | ORAL_TABLET | Freq: Three times a day (TID) | ORAL | Status: DC

## 2015-05-30 MED ORDER — LINAGLIPTIN 5 MG PO TABS: 5.0000 mg | ORAL_TABLET | Freq: Every day | ORAL | Status: DC

## 2015-05-30 MED ORDER — METFORMIN HCL 500 MG PO TABS: 500.0000 mg | ORAL_TABLET | Freq: Two times a day (BID) | ORAL | Status: DC

## 2015-05-30 MED ORDER — MUSCLE RUB 10-15 % EX CREA: 1.0000 "application " | TOPICAL_CREAM | CUTANEOUS | Status: DC | PRN

## 2015-05-30 MED ORDER — MEMANTINE HCL 5 MG PO TABS: 5.0000 mg | ORAL_TABLET | Freq: Every day | ORAL | Status: DC

## 2015-05-30 MED ORDER — ATORVASTATIN CALCIUM 80 MG PO TABS: 80.0000 mg | ORAL_TABLET | Freq: Every day | ORAL | Status: DC

## 2015-05-30 NOTE — Progress Notes (Addendum)
Speech Language Pathology Discharge Summary  Patient Details  Name: Billy Torres MRN: [id number] Date of Birth: April 08, 1955  Today's Date: 05/30/2015 SLP Individual Time: 0900-1000 SLP Individual Time Calculation (min): 60 min   Skilled Therapeutic Interventions:  Pt participated in skilled SLP therapy with focus on cognitive goals for attention and recall. Pt was able to learn a new game with min A for repetition, but was consistently only able to maintain 2 of 3 items in his working memory for successful participation in the new game. Despite mod cueing during teaching period, pt was able to complete with 70% mod I.    Patient has met 4 of 4 long term goals.  Patient to discharge at overall Supervision level.  Reasons goals not met: underlying memory impairment   Clinical Impression/Discharge Summary: Pt is able to communicate wants/needs [facility]. Some impairments persist related to problem solving, reasoning and recall which limit pt's safety for independent function.  Care Partner:  Caregiver Able to Provide Assistance: Yes  Type of Caregiver Assistance: Physical;Cognitive  Recommendation:  24 hour supervision/assistance;Skilled Nursing facility;Home Health SLP  Rationale for SLP Follow Up: Maximize functional communication;Maximize cognitive function and independence;Reduce caregiver burden   Equipment:     Reasons for discharge: Discharged from hospital   Patient/Family Agrees with Progress Made and Goals Achieved: Yes   Function:  Eating Eating                 Cognition Comprehension Comprehension assist level: Follows basic conversation/direction with no assist  Expression   Expression assist level: Expresses basic 90% of the time/requires cueing < 10% of the time.  Social Interaction Social Interaction assist level: Interacts appropriately with others with medication or extra time (anti-anxiety, antidepressant).  Problem Solving Problem solving assist level:  Solves basic 75 - 89% of the time/requires cueing 10 - 24% of the time  Memory Memory assist level: Recognizes or recalls 75 - 89% of the time/requires cueing 10 - 24% of the time   Vinetta Bergamo MA, CCC-SLP 05/30/2015, 4:25 PM

## 2015-05-30 NOTE — Progress Notes (Signed)
NT notes that patient made inappropriate comments towards her. RN spoke with patient and redirected. Continue with plan of care.

## 2015-05-30 NOTE — Progress Notes (Signed)
Occupational Therapy Discharge Summary and OT Intervention  Patient Details  Name: KALI AMBLER MRN: [id number] Date of Birth: March 10, 1955  Today's Date: 05/30/2015 OT Individual Time:1101  - 1200   23 skilled minutes of OT intervention   Patient has met 11 of 11 long term goals due to improved activity tolerance, improved balance, postural control, ability to compensate for deficits and improved coordination.  Patient to discharge at [facility] Assist level.  .    Reasons goals not met: all goals met  Recommendation:  Patient will benefit from ongoing skilled OT services in skilled nursing facility setting to continue to advance functional skills in the area of BADL.  Equipment: No equipment provided  Reasons for discharge: treatment goals met  Patient/family agrees with progress made and goals achieved: Yes   OT Intervention: Upon entering the room, pt seated in wheelchair awaiting therapist arrival with no c/o pain this session. Pt declined shower this session but agreeable to OT intervention. Pt ambulates to bathroom with steady assist and use of RW into bathroom. Pt having BM this session and able to perform clothing management and hygiene with steady assistance for balance. Pt ambulating to sink while standing to brush teeth and wash hands with encouragement secondary to fatigue. Pt seated to shave face with increased time and set up to open containers. Pt dressed from wheelchair at sink with min A to don jeans with assist to button pants. Pt required ~ 10 minutes to initiate and attend to donning pull over shirt this session. Pt given maximal cues for task completion. Pt remained seated in wheelchair at end of session with call bell and all needed items within reach upon exiting the room.   OT Discharge Precautions/Restrictions  Precautions Precautions: Fall Precaution Comments: L hemiparesis Vision/Perception  Vision- History Baseline Vision/History: Wears glasses Wears  Glasses: At all times Patient Visual Report: No change from baseline  Cognition Overall Cognitive Status: Impaired/Different from baseline Arousal/Alertness: Awake/alert Orientation Level: Oriented X4 Attention: Alternating Sustained Attention: Appears intact Selective Attention: Impaired Selective Attention Impairment: Functional basic;Verbal basic Alternating Attention: Impaired Alternating Attention Impairment: Verbal basic;Functional basic Memory: Impaired Memory Impairment: Retrieval deficit;Decreased recall of new information;Decreased short term memory Decreased Short Term Memory: Verbal complex;Verbal basic Awareness: Impaired Awareness Impairment: Emergent impairment Problem Solving: Impaired Problem Solving Impairment: Verbal complex;Functional basic Executive Function:  (all limited due to underlying impairment) Safety/Judgment: Impaired Sensation Sensation Light Touch: Appears Intact Hot/Cold: Appears Intact Motor  Motor Motor: Ataxia;Abnormal postural alignment and control;Other (comment) Motor - Discharge Observations: L hemiparesis, UE improving, ongoing decreased attention to LLE during ambulation resulting in significantly decreased step length which increases pt's risk of falling Mobility  Bed Mobility Bed Mobility: Not assessed Transfers Sit to Stand: 5: Supervision Sit to Stand Details: Verbal cues for safe use of DME/AE;Verbal cues for precautions/safety Stand to Sit: 5: Supervision Stand to Sit Details (indicate cue type and reason): Verbal cues for safe use of DME/AE;Verbal cues for precautions/safety  Trunk/Postural Assessment  Cervical Assessment Cervical Assessment: Within Functional Limits Thoracic Assessment Thoracic Assessment: Within Functional Limits Lumbar Assessment Lumbar Assessment: Within Functional Limits Postural Control Postural Control: Deficits on evaluation Righting Reactions: delayed Protective Responses: delayed   Balance Balance Balance Assessed: Yes Static Sitting Balance Static Sitting - Balance Support: No upper extremity supported;Feet supported Static Sitting - Level of Assistance: 6: Modified independent (Device/Increase time) Dynamic Sitting Balance Dynamic Sitting - Balance Support: Feet supported;No upper extremity supported Dynamic Sitting - Level of Assistance: 5: Stand by assistance  Static Standing Balance Static Standing - Balance Support: Bilateral upper extremity supported;During functional activity Static Standing - Level of Assistance: 5: Stand by assistance Dynamic Standing Balance Dynamic Standing - Balance Support: Bilateral upper extremity supported;During functional activity Dynamic Standing - Level of Assistance: 4: Min assist Extremity/Trunk Assessment RUE Assessment RUE Assessment: Within Functional Limits LUE Assessment LUE Assessment: Exceptions to [facility] (3-5 throughout)   See Function Navigator for Current Functional Status.  Phineas Semen 05/30/2015, 4:49 PM

## 2015-05-30 NOTE — Progress Notes (Signed)
[facility] PHYSICAL MEDICINE & REHABILITATION     PROGRESS NOTE  Subjective/Complaints:  Pt laying in bed this AM.  He slept well overnight.  He continues to notice improvement in the function of his left hand.   ROS: Denies left shoulder pain, CP, SOB, nausea, vomiting, diarrhea.  Objective: Vital Signs: Blood pressure 128/71, pulse 87, temperature 97.9 F (36.6 C), temperature source Oral, resp. rate 17, height 5\' 8"  (1.727 m), weight 66.1 kg (145 lb 11.6 oz), SpO2 99 %. No results found.  Recent Labs  05/29/15 1159  WBC 9.9  HGB 14.9  HCT 45.6  PLT 293    Recent Labs  05/29/15 1159  NA 137  K 3.8  CL 98*  GLUCOSE 84  BUN 15  CREATININE 1.12  CALCIUM 10.0   CBG (last 3)   Recent Labs  05/29/15 1636 05/29/15 2046 05/30/15 0659  GLUCAP 129* 105* 98    Wt Readings from Last 3 Encounters:  05/27/15 66.1 kg (145 lb 11.6 oz)  05/14/15 67.586 kg (149 lb)  05/12/15 67.586 kg (149 lb)    Physical Exam:  BP 128/71 mmHg  Pulse 87  Temp(Src) 97.9 F (36.6 C) (Oral)  Resp 17  Ht 5\' 8"  (1.727 m)  Wt 66.1 kg (145 lb 11.6 oz)  BMI 22.16 kg/m2  SpO2 99% Constitutional: He appears well-developed and well-nourished. No distress. Vital signs reviewed. HENT: Normocephalic and atraumatic.  Eyes: Conjunctivae and EOM are normal.  Respiratory: Effort normal and breath sounds normal. No stridor. No respiratory distress. He has no wheezes.  GI: Soft. Bowel sounds are normal. He exhibits no distension. There is no tenderness.  Musculoskeletal: No edema. No tenderness.  Neurological: He is alert and oriented.  Mild dysarthria with mild left facial weakness.  Able to follow simple motor commands without difficulty. Has poor insight and lacks awareness of deficits, slightly improved Motor: Left upper extremity: Shoulder abduction 4/5, elbow flexion/extension 4/5, hand grip 4-/5   MAS 1/4 elbow flexors  Left lower extremity and 4+/5 proximal to distal, increased extensor  tone Right upper/right lower extremity 5/5 proximal to distal  Skin: Skin is warm and dry. He is not diaphoretic.  Psychiatric: He has a normal mood and affect. His behavior is normal. Thought content normal   Assessment/Plan: 1. Functional deficits secondary to right corona radiata infarct which require 3+ hours per day of interdisciplinary therapy in a comprehensive inpatient rehab setting. Physiatrist is providing close team supervision and 24 hour management of active medical problems listed below. Physiatrist and rehab team continue to assess barriers to discharge/monitor patient progress toward functional and medical goals.  Function:  Bathing Bathing position Bathing activity did not occur: Refused Position: Production manager parts bathed by patient: Left arm, Chest, Abdomen, Front perineal area, Right upper leg, Left upper leg, Left lower leg, Buttocks, Right lower leg, Right arm Body parts bathed by helper: Back  Bathing assist Assist Level: Touching or steadying assistance(Pt > 75%)      Upper Body Dressing/Undressing Upper body dressing   What is the patient wearing?: Pull over shirt/dress     Pull over shirt/dress - Perfomed by patient: Thread/unthread right sleeve, Thread/unthread left sleeve, Put head through opening, Pull shirt over trunk Pull over shirt/dress - Perfomed by helper: Thread/unthread left sleeve        Upper body assist Assist Level: More than reasonable time   Set up : To obtain clothing/put away  Lower Body Dressing/Undressing Lower body dressing  What is the patient wearing?: Underwear, Pants, Socks Underwear - Performed by patient: Thread/unthread right underwear leg, Thread/unthread left underwear leg, Pull underwear up/down   Pants- Performed by patient: Thread/unthread right pants leg, Thread/unthread left pants leg, Pull pants up/down Pants- Performed by helper: Pull pants up/down Non-skid slipper socks- Performed by patient:  Don/doff right sock, Don/doff left sock Non-skid slipper socks- Performed by helper: Don/doff right sock, Don/doff left sock                  Lower body assist Assist for lower body dressing: Touching or steadying assistance (Pt > 75%)      Toileting Toileting Toileting activity did not occur:  (handoff to NT) Toileting steps completed by patient: Adjust clothing prior to toileting, Performs perineal hygiene, Adjust clothing after toileting Toileting steps completed by helper: Adjust clothing prior to toileting, Performs perineal hygiene, Adjust clothing after toileting Toileting Assistive Devices: Grab bar or rail  Toileting assist Assist level: Supervision or verbal cues   Transfers Chair/bed transfer   Chair/bed transfer method: Stand pivot, Ambulatory Chair/bed transfer assist level: Touching or steadying assistance (Pt > 75%) Chair/bed transfer assistive device: Armrests, Medical sales representative     Max distance: 150 Assist level: Touching or steadying assistance (Pt > 75%)   Wheelchair          Cognition Comprehension Comprehension assist level: Follows basic conversation/direction with no assist  Expression Expression assist level: Expresses basic 90% of the time/requires cueing < 10% of the time.  Social Interaction Social Interaction assist level: Interacts appropriately with others with medication or extra time (anti-anxiety, antidepressant).  Problem Solving Problem solving assist level: Solves basic 75 - 89% of the time/requires cueing 10 - 24% of the time  Memory Memory assist level: Recognizes or recalls 75 - 89% of the time/requires cueing 10 - 24% of the time    Medical Problem List and Plan: 1. Left sided weakness, gait abnormalities secondary to right corona radiata infarct on 05/14/15.  Cont CIR, pt stable for discharge to SNF 2. DVT Prophylaxis/Anticoagulation: Pharmaceutical: Lovenox 3. Pain Management: muscle rub for right shoulder.  Reviewed ROM exercises 4. Mood: LCSW to follow for evaluation and support.  5. Neuropsych: This patient is not fully capable of making decisions on his own behalf. 6. Skin/Wound Care: Routine pressure relief measures. Maintain adequate nutritional and hydration status 7. Fluids/Electrolytes/Nutrition: Monitor I/O.   BMP within acceptable range on 5/22 8. T2DM: Monitor BS ac/hs. Resumed metformin 500 mg bid and trajenta 5 mg daily.   Remains fairly well controlled  CBG (last 3)   Recent Labs  05/29/15 1636 05/29/15 2046 05/30/15 0659  GLUCAP 129* 105* 98   Will monitor with increased activity and make adjustments if necessary 9. Dyslipidemia: on Lipitor daily.  10. HTN: Monitor BP.  Hold lisinopril to allow for adequate perfusion. Resume lisinopril if BP trends upwards. (still no indication yet)  11. Leukocytosis: Resolved. afebrile,   UA negative, Uculture with multispecies on 5/11- recollection also show multi species, no need to reculture unless more symptomatic, febrile  CXR reviewed from 5/16, relatively unremarkable 12. Baseline dementia  Namenda started on 5/17  Will monitor for improvement, although this may take a few weeks 13. Spasticity in left elbow flexors  Baclofen 5 started on 5/18  LOS (Days) 13 A FACE TO FACE EVALUATION WAS PERFORMED  Ankit Lorie Phenix 05/30/2015 8:35 AM

## 2015-05-30 NOTE — Progress Notes (Signed)
Social Work Patient ID: Billy Torres, male   DOB: 1955/10/31, 60 y.o.   MRN: [id number]  Have received SNF bed offer from [facility] who can admit pt tomorrow.  Have left message for son and have discussed with pt who is agreeable.  Both aware that SNF placement is a very short term plan and that son needs to continue to work on getting 24/7 care in the home, pursuing adult day program, etc.  Tx team alerted to d/c planned for tomorrow.  Marvel Mcphillips, LCSW

## 2015-05-30 NOTE — Progress Notes (Signed)
Physical Therapy Discharge Summary  Patient Details  Name: Billy Torres MRN: [id number] Date of Birth: 02-Aug-1955  Today's Date: 05/30/2015 PT Individual Time: 1415-1530 PT Individual Time Calculation (min): 75 min    Patient has met 8 of 8 long term goals due to improved activity tolerance, improved balance, improved postural control and improved attention.  Patient to discharge at an ambulatory level Min Assist.   Patient to d/c to SNF for further rehab in order to progress to supervision level or better.    Recommendation:  Patient will benefit from ongoing skilled PT services in skilled nursing facility setting to continue to advance safe functional mobility, address ongoing impairments in balance, awareness, attention, proprioception, and functional use of LUE/LLE, and minimize fall risk.  Equipment: No equipment provided  Reasons for discharge: treatment goals met  Patient/family agrees with progress made and goals achieved: Yes   Skilled PT Intervention: Pt received resting in w/c with no c/o pain and agreeable to therapy session.  Session focus on gait training in controlled environment and in home environment to find/don belt in room, stair negotiation, transfers, LLE NMR, dynamic standing balance, and attention.    Gait training within room for toileting (supervision overall with occasional steady assist for pulling up pants), and to locate belt for patient's pants.  Min assist for ambulation within room with RW with mod verbal cues for attention to LLE and positioning of RW. Gait training to/from therapy gym with overall steady assist and min verbal cues for L step length/R weight shift, mod cues required when turning.    NMR: Static standing with L foot propped on 6" step focus on RLE weight shift/weight bearing while pt complete a moderately challenging cognitive task in a distracting environment.  Mod cues to attend to task.  NMR for R weight shift and LLE strengthening  ascending LLE/descending RLE on stairs x12 with 2 hand rails and min assist with increasing verbal cues for R weight shift during descent to allow LLE to come fully to next step.    Pt returned to room at end of session with min assist and positioned upright in recliner with call bell in reach and needs met.   PT Discharge Cognition Overall Cognitive Status: Impaired/Different from baseline Arousal/Alertness: Awake/alert Orientation Level: Oriented X4 Attention: Alternating Sustained Attention: Appears intact Selective Attention: Impaired Selective Attention Impairment: Functional basic;Verbal basic Alternating Attention: Impaired Alternating Attention Impairment: Verbal basic;Functional basic Memory: Impaired Memory Impairment: Retrieval deficit;Decreased recall of new information;Decreased short term memory Decreased Short Term Memory: Verbal complex;Verbal basic Awareness: Impaired Awareness Impairment: Emergent impairment Problem Solving: Impaired Problem Solving Impairment: Verbal complex;Functional basic Executive Function:  (all limited due to underlying impairment) Safety/Judgment: Impaired Sensation Sensation Light Touch: Not tested Motor  Motor Motor: Ataxia;Abnormal postural alignment and control;Other (comment) Motor - Discharge Observations: L hemiparesis, UE improving, ongoing decreased attention to LLE during ambulation resulting in significantly decreased step length which increases pt's risk of falling  Mobility Bed Mobility Bed Mobility: Not assessed Transfers Transfers: Yes Sit to Stand: 5: Supervision Sit to Stand Details: Verbal cues for safe use of DME/AE;Verbal cues for precautions/safety Stand to Sit: 5: Supervision Stand to Sit Details (indicate cue type and reason): Verbal cues for safe use of DME/AE;Verbal cues for precautions/safety Stand Pivot Transfers: 4: Min assist Stand Pivot Transfer Details: Verbal cues for gait pattern;Verbal cues for  sequencing;Verbal cues for technique;Verbal cues for safe use of DME/AE;Verbal cues for precautions/safety;Manual facilitation for weight shifting Stand Pivot Transfer Details (indicate  cue type and reason): continues to require steady assist 2/2 decreased attention to LLE Locomotion  Ambulation Ambulation: Yes Ambulation/Gait Assistance: 4: Min guard;4: Min assist Ambulation Distance (Feet): 165 Feet Assistive device: Rolling walker (L hand splint) Ambulation/Gait Assistance Details: Verbal cues for sequencing;Verbal cues for technique;Verbal cues for gait pattern;Verbal cues for safe use of DME/AE;Manual facilitation for weight shifting;Tactile cues for weight shifting Ambulation/Gait Assistance Details: short bouts of close supervision, but ultimately requires steady>min assist for balance 2/2 decreased L step length and decreased attention to LLE Gait Gait: Yes Gait Pattern: Impaired Gait Pattern: Step-to pattern;Decreased step length - left;Decreased weight shift to right;Decreased stance time - right;Poor foot clearance - left High Level Ambulation High Level Ambulation: Side stepping Side Stepping: min assist Stairs / Additional Locomotion Stairs: Yes Stairs Assistance: 4: Min assist Stairs Assistance Details: Verbal cues for sequencing;Verbal cues for technique;Verbal cues for gait pattern;Verbal cues for safe use of DME/AE;Verbal cues for precautions/safety;Manual facilitation for weight shifting Stair Management Technique: Two rails Number of Stairs: 12 Height of Stairs: 6 Ramp: Not tested (comment) Curb: Not tested (comment) Wheelchair Mobility Wheelchair Mobility: No  Trunk/Postural Assessment  Cervical Assessment Cervical Assessment: Within Functional Limits Thoracic Assessment Thoracic Assessment: Within Functional Limits Lumbar Assessment Lumbar Assessment: Within Functional Limits Postural Control Postural Control: Deficits on evaluation Righting Reactions:  delayed Protective Responses: delayed  Balance Balance Balance Assessed: Yes Static Sitting Balance Static Sitting - Balance Support: No upper extremity supported;Feet supported Static Sitting - Level of Assistance: 6: Modified independent (Device/Increase time) Dynamic Sitting Balance Dynamic Sitting - Balance Support: Feet supported;No upper extremity supported Dynamic Sitting - Level of Assistance: 5: Stand by assistance Static Standing Balance Static Standing - Balance Support: Bilateral upper extremity supported;During functional activity Static Standing - Level of Assistance: 5: Stand by assistance Dynamic Standing Balance Dynamic Standing - Balance Support: Bilateral upper extremity supported;During functional activity Dynamic Standing - Level of Assistance: 4: Min assist Extremity Assessment  RLE Assessment RLE Assessment: Not tested LLE Assessment LLE Assessment: Not tested   See Function Navigator for Current Functional Status.  Barton Want E Penven-Crew 05/30/2015, 4:31 PM

## 2015-05-30 NOTE — Discharge Summary (Signed)
Physician Discharge Summary  Patient ID: Billy Torres MRN: [id number] DOB/AGE: 61-Nov-1957 61 y.o.  Admit date: 05/17/2015 Discharge date: 05/31/2015  Discharge Diagnoses:  Principal Problem:   Acute thrombotic stroke [facility]) Active Problems:   Gait disturbance, post-stroke   Hemiparesis (HCC)   Cognitive deficit due to old cerebral infarction   Benign essential HTN   Leukocytosis   Dementia   Muscle spasticity   Discharged Condition: stable.   Significant Diagnostic Studies:  Dg Chest Port 1 View  05/23/2015  CLINICAL DATA:  Leukocytosis. EXAM: PORTABLE CHEST 1 VIEW COMPARISON:  05/14/2015 FINDINGS: The heart size and mediastinal contours are within normal limits. Both lungs are clear. The visualized skeletal structures are unremarkable. IMPRESSION: No active disease. Electronically Signed   By: Lajean Manes M.D.   On: 05/23/2015 09:51   Recent Labs  Basic Metabolic Panel: BMP Latest Ref Rng 05/29/2015 05/24/2015 05/22/2015  Glucose 65 - 99 mg/dL 84 - 118(H)  BUN 6 - 20 mg/dL 15 - 15  Creatinine 0.61 - 1.24 mg/dL 1.12 1.09 1.05  Sodium 135 - 145 mmol/L 137 - 135  Potassium 3.5 - 5.1 mmol/L 3.8 - 4.1  Chloride 101 - 111 mmol/L 98(L) - 98(L)  CO2 22 - 32 mmol/L 29 - 28  Calcium 8.9 - 10.3 mg/dL 10.0 - 9.6    CBC: CBC Latest Ref Rng 05/29/2015 05/22/2015 05/20/2015  WBC 4.0 - 10.5 K/uL 9.9 11.7(H) 10.8(H)  Hemoglobin 13.0 - 17.0 g/dL 14.9 15.6 15.2  Hematocrit 39.0 - 52.0 % 45.6 47.0 46.0  Platelets 150 - 400 K/uL 293 243 237     CBG:  Recent Labs Lab 05/30/15 0659 05/30/15 1153 05/30/15 1656 05/30/15 2036 05/31/15 0640  GLUCAP 98 119* 146* 125* 93    Brief HPI:   Billy Torres is a 60 y.o. RH-male with history of T2DM, HTN, L-MCA infarct 2015 without residual deficits who had been without plavix/ASA X 1 month and was admitted on 05/14/15 with mild left sided weakness X 2 days with multiple falls.  MRI/MRA brain showed small acute non-hemorrhagic infarct  posterior right corona radiata to posterior limb of internal capsule, global atropy and remote/chronic small vessel disease.  He did have worsening of symptoms with confusion after admission and follow up MRI showed minimal enlargement. Dr. Leonie Man recommended resuming ASA 81 mg and plavix for stroke due to small vessel disease. Patient with resultant left sided weakness with ataxia, problems with processing with cognitive deficits and balance deficits. CIR was recommended for follow up therapy.    Hospital Course: Billy Torres was admitted to rehab 05/17/2015 for inpatient therapies to consist of PT, ST and OT at least three hours five days a week. Past admission physiatrist, therapy team and rehab RN have worked together to provide customized collaborative inpatient rehab. Blood pressures have been monitored on bid basis and have been well controlled. Metformin and Trajenta were resumed at admission and diabetes has been monitored with ac/hs checks. Po intake has been good and blood sugars have been fairly controlled. Lytes have been monitored and renal status is stable.  He continues on lipitor for dyslipidemia.  Namenda was added for cognitive deficits due to baseline dementia.  He has developed spasticity left elbow flexors and has been educated on positioning as well as ROM. Baclofen was added on 5/18 and titrated upwards to help with symptoms.  Reactive leucocytosis has resolved and H/H is stable. Urine culture and CXR were ordered for workup and have been negative.  Dr. McDermott/ neuropsych was consulted for cognitive evaluation and support. Patient denied any cognitive issues or mood disruption but exam was consistent with major neurocognitive disorder likely due to cerebrovascular disease.  He has made progress during his rehab stay to min assist level.  He continues to be limited by left hemiparesis as well as cognitive deficits. Family has elected on further therapy at SNF and he was  discharged to facility on 05/31/15.    Rehab course: During patient's stay in rehab weekly team conferences were held to monitor patient's progress, set goals and discuss barriers to discharge. At admission, patient required moderate assistance with mobility and min to  Moderate assistance with ADL tasks. Mild cognitive impairments affected attention, problems solving, recall and awareness of deficits.  He has had improvement in activity tolerance, balance, postural control, as well as ability to compensate for deficits. He is has had improvement in functional use LUE  and LLE as well as improved awareness He requires min assist for transfers and to ambulate 165' with RW. He requires min assist with cues to complete bathing and dressing.    Disposition: Skilled Nursing Facility  Diet: Heart Healthy/ Carb Modified Medium.   Special Instructions: 1. Check blood sugars ac/hs. Use SSI per protocol        Discharge Instructions    Ambulatory referral to Physical Medicine Rehab    Complete by:  As directed   2 week follow up/moderate complexity--going to SNF            Medication List    STOP taking these medications        L-Arginine 500 MG Caps     sitaGLIPtin-metformin 50-1000 MG tablet  Commonly known as:  JANUMET      TAKE these medications        acetaminophen 325 MG tablet  Commonly known as:  TYLENOL  Take 2 tablets (650 mg total) by mouth every 6 (six) hours as needed for mild pain.     aspirin 81 MG EC tablet  Take 1 tablet (81 mg total) by mouth daily.     atorvastatin 80 MG tablet  Commonly known as:  LIPITOR  Take 1 tablet (80 mg total) by mouth daily.     baclofen 10 MG tablet  Commonly known as:  LIORESAL  Take 0.5 tablets (5 mg total) by mouth 3 (three) times daily.     CENTRUM ADULTS PO  Take by mouth.     clopidogrel 75 MG tablet  Commonly known as:  PLAVIX  Take 1 tablet (75 mg total) by mouth daily with breakfast.     glucose blood test strip   Commonly known as:  TRUE METRIX BLOOD GLUCOSE TEST  Use as instructed     linagliptin 5 MG Tabs tablet  Commonly known as:  TRADJENTA  Take 1 tablet (5 mg total) by mouth daily.     lisinopril 10 MG tablet  Commonly known as:  PRINIVIL,ZESTRIL  Take 1 tablet (10 mg total) by mouth daily. Start after 05/21/2015.     memantine 5 MG tablet  Commonly known as:  NAMENDA  Take 1 tablet (5 mg total) by mouth daily.     metFORMIN 500 MG tablet  Commonly known as:  GLUCOPHAGE  Take 1 tablet (500 mg total) by mouth 2 (two) times daily with a meal.     MUSCLE RUB 10-15 % Crea  Apply 1 application topically as needed for muscle pain.     senna-docusate 8.6-50  MG tablet  Commonly known as:  Senokot-S  Take 1 tablet by mouth at bedtime as needed for mild constipation.     TRUE METRIX AIR GLUCOSE METER Devi  1 Device by Does not apply route 2 (two) times daily.       Follow-up Information    Follow up with Ankit Lorie Phenix, MD.   Specialty:  Physical Medicine and Rehabilitation   Why:  office will call with follow up appointment   Contact information:   [address] [place] [address] [phone]       Follow up with Dennie Bible, NP. Call today.   Specialty:  Family Medicine   Why:  for appointment in 4-6 weeks.    Contact information:   [address] [place] [address] [phone]       Signed: Bary Leriche 05/31/2015, 9:14 AM

## 2015-05-31 ENCOUNTER — Inpatient Hospital Stay (HOSPITAL_COMMUNITY): Payer: Self-pay | Admitting: Physical Therapy

## 2015-05-31 ENCOUNTER — Inpatient Hospital Stay (HOSPITAL_COMMUNITY): Payer: Self-pay | Admitting: Speech Pathology

## 2015-05-31 ENCOUNTER — Inpatient Hospital Stay (HOSPITAL_COMMUNITY): Payer: Self-pay | Admitting: Occupational Therapy

## 2015-05-31 LAB — CREATININE, SERUM
CREATININE: 1.13 mg/dL (ref 0.61–1.24)
GFR calc Af Amer: >60 mL/min (ref >60)
GFR calc non Af Amer: >60 mL/min (ref >60)

## 2015-05-31 LAB — GLUCOSE, CAPILLARY
GLUCOSE-CAPILLARY: 122 mg/dL — AB (ref 65–99)
Glucose-Capillary: 93 mg/dL (ref 65–99)

## 2015-05-31 NOTE — Progress Notes (Signed)
Patient vomited large amt. of undigested food on the floor; during assessment he denied any further discomfort. V/S were BP 137/65, 98.3, 104. 98% RA. Compazine 4 mg was given for Nausea

## 2015-05-31 NOTE — Progress Notes (Signed)
Report called to receiving nurse at [facility] facility. Pt prepared for discharge. Awaiting family to transport patient.

## 2015-05-31 NOTE — Progress Notes (Signed)
[facility]IT[facility]     PROGRESS NOTE  Subjective/Complaints:  Pt laying in bed this AM.  He states he did not sleep well o/n because he felt sick, but that has since resolved.   ROS: Denies left shoulder pain, CP, SOB, nausea, vomiting, diarrhea.  Objective: Vital Signs: Blood pressure 119/65, pulse 78, temperature 98.2 F (36.8 C), temperature source Oral, resp. rate 18, height 5\' 8"  (1.727 m), weight 66.1 kg (145 lb 11.6 oz), SpO2 98 %. No results found.  Recent Labs  05/29/15 1159  WBC 9.9  HGB 14.9  HCT 45.6  PLT 293    Recent Labs  05/29/15 1159 05/31/15 0455  NA 137  --   K 3.8  --   CL 98*  --   GLUCOSE 84  --   BUN 15  --   CREATININE 1.12 1.13  CALCIUM 10.0  --    CBG (last 3)   Recent Labs  05/30/15 1656 05/30/15 2036 05/31/15 0640  GLUCAP 146* 125* 93    Wt Readings from Last 3 Encounters:  05/27/15 66.1 kg (145 lb 11.6 oz)  05/14/15 67.586 kg (149 lb)  05/12/15 67.586 kg (149 lb)    Physical Exam:  BP 119/65 mmHg  Pulse 78  Temp(Src) 98.2 F (36.8 C) (Oral)  Resp 18  Ht 5\' 8"  (1.727 m)  Wt 66.1 kg (145 lb 11.6 oz)  BMI 22.16 kg/m2  SpO2 98% Constitutional: He appears well-developed and well-nourished. No distress. Vital signs reviewed. HENT: Normocephalic and atraumatic.  Eyes: Conjunctivae and EOM are normal.  Respiratory: Effort normal and breath sounds normal. No stridor. No respiratory distress. He has no wheezes.  GI: Soft. Bowel sounds are normal. He exhibits no distension. There is no tenderness.  Musculoskeletal: No edema. No tenderness.  Neurological: He is alert and oriented.  Mild dysarthria with mild left facial weakness.  Able to follow simple motor commands without difficulty. Has poor insight and lacks awareness of deficits, slightly improved Motor: Left upper extremity: Shoulder abduction 4/5, elbow flexion/extension 4/5, hand grip 4/5   MAS 1/4 elbow flexors (slightly improved) Left  lower extremity and 5/5 proximal to distal, increased extensor tone Right upper/right lower extremity 5/5 proximal to distal  Skin: Skin is warm and dry. He is not diaphoretic.  Psychiatric: He has a normal mood and affect. His behavior is normal. Thought content normal  Assessment/Plan: 1. Functional deficits secondary to right corona radiata infarct which require 3+ hours per day of interdisciplinary therapy in a comprehensive inpatient rehab setting. Physiatrist is providing close team supervision and 24 hour management of active medical problems listed below. Physiatrist and rehab team continue to assess barriers to discharge/monitor patient progress toward functional and medical goals.  Function:  Bathing Bathing position Bathing activity did not occur: Refused Position: Production manager parts bathed by patient: Left arm, Chest, Abdomen, Front perineal area, Right upper leg, Left upper leg, Left lower leg, Buttocks, Right lower leg, Right arm Body parts bathed by helper: Back  Bathing assist Assist Level: Touching or steadying assistance(Pt > 75%)      Upper Body Dressing/Undressing Upper body dressing   What is the patient wearing?: Pull over shirt/dress     Pull over shirt/dress - Perfomed by patient: Thread/unthread right sleeve, Thread/unthread left sleeve, Put head through opening, Pull shirt over trunk Pull over shirt/dress - Perfomed by helper: Thread/unthread left sleeve        Upper body assist Assist Level:  Set up   Set up : To obtain clothing/put away  Lower Body Dressing/Undressing Lower body dressing   What is the patient wearing?: Underwear, Pants, Non-skid slipper socks Underwear - Performed by patient: Thread/unthread right underwear leg, Thread/unthread left underwear leg, Pull underwear up/down   Pants- Performed by patient: Thread/unthread right pants leg, Thread/unthread left pants leg, Pull pants up/down Pants- Performed by helper: Pull  pants up/down Non-skid slipper socks- Performed by patient: Don/doff right sock, Don/doff left sock Non-skid slipper socks- Performed by helper: Don/doff right sock, Don/doff left sock                  Lower body assist Assist for lower body dressing: Supervision or verbal cues, Touching or steadying assistance (Pt > 75%)      Toileting Toileting Toileting activity did not occur:  (handoff to NT) Toileting steps completed by patient: Performs perineal hygiene Toileting steps completed by helper: Adjust clothing prior to toileting, Adjust clothing after toileting Toileting Assistive Devices: Grab bar or rail  Toileting assist Assist level: Touching or steadying assistance (Pt.75%)   Transfers Chair/bed transfer   Chair/bed transfer method: Stand pivot, Ambulatory Chair/bed transfer assist level: Touching or steadying assistance (Pt > 75%) Chair/bed transfer assistive device: Armrests, Medical sales representative     Max distance: 165 Assist level: Touching or steadying assistance (Pt > 75%)   Wheelchair          Cognition Comprehension Comprehension assist level: Follows basic conversation/direction with no assist  Expression Expression assist level: Expresses complex 90% of the time/cues < 10% of the time  Social Interaction Social Interaction assist level: Interacts appropriately 90% of the time - Needs monitoring or encouragement for participation or interaction.  Problem Solving Problem solving assist level: Solves basic 90% of the time/requires cueing < 10% of the time  Memory Memory assist level: Recognizes or recalls 90% of the time/requires cueing < 10% of the time    Medical Problem List and Plan: 1. Left sided weakness, gait abnormalities secondary to right corona radiata infarct on 05/14/15.  D/c today  Will see pt for transitional care management in 1-2 weeks 2. DVT Prophylaxis/Anticoagulation: Pharmaceutical: Lovenox 3. Pain Management: muscle rub  for right shoulder. Reviewed ROM exercises 4. Mood: LCSW to follow for evaluation and support.  5. Neuropsych: This patient is not fully capable of making decisions on his own behalf. 6. Skin/Wound Care: Routine pressure relief measures. Maintain adequate nutritional and hydration status 7. Fluids/Electrolytes/Nutrition: Monitor I/O.   BMP within acceptable range on 5/22 8. T2DM: Monitor BS ac/hs. Resumed metformin 500 mg bid and trajenta 5 mg daily.   Remains fairly well controlled  CBG (last 3)   Recent Labs  05/30/15 1656 05/30/15 2036 05/31/15 0640  GLUCAP 146* 125* 93   Will monitor with increased activity and make adjustments if necessary 9. Dyslipidemia: on Lipitor daily.  10. HTN: Monitor BP.  Stable Lisinopril not resumed.  11. Leukocytosis: Resolved. afebrile,   UA negative, Uculture with multispecies on 5/11- recollection also show multi species, no need to reculture unless more symptomatic, febrile  CXR reviewed from 5/16, relatively unremarkable 12. Baseline dementia  Namenda started on 5/17  Will monitor for improvement, although this may take a few weeks 13. Spasticity in left elbow flexors  Baclofen 5 started on 5/18  LOS (Days) 14 A FACE TO FACE EVALUATION WAS PERFORMED  Ankit Lorie Phenix 05/31/2015 7:54 AM

## 2015-06-01 ENCOUNTER — Non-Acute Institutional Stay (SKILLED_NURSING_FACILITY): Payer: Medicaid Other | Admitting: Internal Medicine

## 2015-06-01 ENCOUNTER — Encounter: Payer: Self-pay | Admitting: Internal Medicine

## 2015-06-01 DIAGNOSIS — Z794 Long term (current) use of insulin: Secondary | ICD-10-CM

## 2015-06-01 DIAGNOSIS — I10 Essential (primary) hypertension: Secondary | ICD-10-CM

## 2015-06-01 DIAGNOSIS — Z720 Tobacco use: Secondary | ICD-10-CM | POA: Diagnosis not present

## 2015-06-01 DIAGNOSIS — M6249 Contracture of muscle, multiple sites: Secondary | ICD-10-CM

## 2015-06-01 DIAGNOSIS — I6930 Unspecified sequelae of cerebral infarction: Secondary | ICD-10-CM

## 2015-06-01 DIAGNOSIS — E1159 Type 2 diabetes mellitus with other circulatory complications: Secondary | ICD-10-CM | POA: Diagnosis not present

## 2015-06-01 DIAGNOSIS — E785 Hyperlipidemia, unspecified: Secondary | ICD-10-CM

## 2015-06-01 DIAGNOSIS — F0150 Vascular dementia without behavioral disturbance: Secondary | ICD-10-CM | POA: Diagnosis not present

## 2015-06-01 DIAGNOSIS — M62838 Other muscle spasm: Secondary | ICD-10-CM

## 2015-06-01 NOTE — Progress Notes (Signed)
Social Work  Discharge Note  The overall goal for the admission was met for:   Discharge location: No - d/c plan changed to SNF as family could not meet care needs  Length of Stay: No - extended slightly due to SNF bed search.  LOS = 14 days  Discharge activity level: Yes - supervision/ min assist  Home/community participation: Yes  Services provided included: MD, RD, PT, OT, SLP, RN, TR, Pharmacy, Neuropsych and SW  Financial Services: Other: Medicaid and SSD applications are pending  Follow-up services arranged: Other: SNF @ [facility]  Comments (or additional information):  Patient/Family verbalized understanding of follow-up arrangements: Yes  Individual responsible for coordination of the follow-up plan: pt/son  Confirmed correct DME delivered: NA    Bradford Cazier

## 2015-06-01 NOTE — Progress Notes (Signed)
HISTORY AND PHYSICAL   DATE: 06/01/15  Location:  [facility] Room Number: 145 B Place of Service: SNF [phone])   Extended Emergency Contact Information Primary Emergency Contact: Antonieta Pert [place] of [place] Phone: [phone] Relation: Other Secondary Emergency Contact: Nada Libman States of [place] Phone: [phone] Mobile Phone: [phone] Relation: Son  Advanced Directive information Would patient like information on creating an advanced directive?: No - patient declined information FULL CODE Chief Complaint  Patient presents with  . 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 He presented to ED with weakness and repeat falls after being out of plavix x 1 month prior to presentation per hospital records. neurology was consulted.  MRI/MRA brain showed small acute nonhemorrhagic right infarct at corona radiata through posterior limb of internal capsule; global atrophy; remote/chronic small vessel disease. F/u MRI due to increased confusion revealed minimum enlargement. He went to inpt rehab x 2 weeks. He presents to SNF for short term rehab.  He reports left sided weakness but feels it is improving with therapy. No dysphagia. (+) muscle spasms. He has not taken any baclofen. No falls since admission. No nursing issues. Sleeping well. He is a poor historian due to dementia. Hx obtained from chart  DM - A1c 7.2%. He is insulin dependent and takes tradjenta and metformin  HTN - BP stable with diet. Takes ASA daily  Dementia - vascular type; no behavioral disturbance. He takes namenda  Hx CVA in 2015/muscle spasticity - he was taking plavix and ASA but was out x 1 month prior to hospital  admission. He takes baclofen for spastic muscles  Hyperlipidemia - currently on lipitor. LDL 146  Tobacco abuse - continues to smoke despite being counseled about dangers of smoking  Past Medical History  Diagnosis Date  . Diabetes mellitus without complication ([place])   . Hypertension   . Hyperlipidemia     Past Surgical History  Procedure Laterality Date  . Neck surgery      Patient Care Team: Micheline Chapman, NP as PCP - General (Family Medicine)  Social History   Social History  . Marital Status: Divorced    Spouse Name: N/A  . Number of Children: N/A  . Years of Education: N/A   Occupational History  . Not on file.   Social History Main Topics  . Smoking status: Current Every Day Smoker -- 0.25 packs/day    Types: Cigarettes  . Smokeless tobacco: Not on file  . Alcohol Use: No  . Drug Use: No  . Sexual Activity: Not on file   Other Topics Concern  . Not on file   Social History Narrative     reports that he has been smoking Cigarettes.  He has been smoking about 0.25 packs per day. He does not have any smokeless tobacco history on file. He reports that he does not drink alcohol or use illicit drugs.  Family History  Problem Relation Age of Onset  . Hypertension Mother   . Hypertension Father    No family status information on file.    Immunization History  Administered Date(s) Administered  . Pneumococcal Polysaccharide-23 02/28/2014  . Td 04/08/1998    No Known Allergies  Medications: Patient's Medications  New Prescriptions   No medications on file  Previous Medications   ACETAMINOPHEN (TYLENOL) 325 MG TABLET    Take 2 tablets (650 mg total) by mouth every 6 (six) hours as needed for mild pain.   ASPIRIN EC 81 MG EC TABLET    Take 1 tablet (81 mg total) by mouth daily.   ATORVASTATIN (LIPITOR) 80 MG TABLET    Take 1 tablet (80 mg total) by mouth daily.   BACLOFEN (LIORESAL) 10 MG TABLET    Take 0.5 tablets (5 mg total) by mouth 3 (three)  times daily.   CLOPIDOGREL (PLAVIX) 75 MG TABLET    Take 1 tablet (75 mg total) by mouth daily with breakfast.   INSULIN ASPART (NOVOLOG FLEXPEN) 100 UNIT/ML FLEXPEN    Inject as per sliding scale. If 1-149= 0, 150-600= 5 units before meals   LINAGLIPTIN (TRADJENTA) 5 MG TABS TABLET    Take 1 tablet (5 mg total) by mouth daily.   LISINOPRIL (PRINIVIL,ZESTRIL) 10 MG TABLET    Take 1 tablet (10 mg total) by mouth daily. Start after 05/21/2015.   MEMANTINE (NAMENDA) 5 MG TABLET    Take 1 tablet (5 mg total) by mouth daily.   MENTHOL-METHYL SALICYLATE (MUSCLE RUB) 10-15 % CREA    Apply 1 application topically as needed for muscle pain.   METFORMIN (GLUCOPHAGE) 500 MG TABLET    Take 1 tablet (500 mg total) by mouth 2 (two) times daily with a meal.   MULTIPLE VITAMINS-MINERALS (CENTRUM ADULTS PO)    Take by mouth.   SENNA-DOCUSATE (SENOKOT-S) 8.6-50 MG TABLET    Take 1 tablet by mouth at bedtime as needed for mild constipation.  Modified Medications   No medications on file  Discontinued Medications   BLOOD GLUCOSE MONITORING SUPPL (TRUE METRIX AIR GLUCOSE METER) DEVI    1 Device by Does not apply route 2 (two) times daily.   GLUCOSE BLOOD (TRUE METRIX BLOOD GLUCOSE TEST) TEST STRIP    Use as instructed    Review of Systems  Unable to perform ROS: Dementia    Filed Vitals:   06/01/15 1407  BP: 130/70  Pulse: 74  Temp: 97.8 F (36.6 C)  TempSrc: Oral  Resp: 16  Height: 5' 8"  (1.727 m)  Weight: 145 lb 9.6 oz (66.044 kg)   Body mass index is 22.14 kg/(m^2).  Physical Exam  Constitutional: He appears well-developed and well-nourished.  Sitting in w/c in NAD  HENT:  Mouth/Throat: Oropharynx is clear and moist.  Eyes: Pupils are equal, round, and reactive to light. No scleral icterus.  Neck: Neck supple. Carotid bruit is not present. No thyromegaly present.  Cardiovascular: Normal rate, regular rhythm, normal heart sounds and intact distal pulses.  Exam reveals no gallop and no friction  rub.   No murmur heard. no distal LE swelling. No calf TTP  Pulmonary/Chest: Effort normal and breath sounds normal. He has no wheezes. He has no rales. He exhibits no tenderness.  Abdominal: Soft. Bowel sounds are normal. He exhibits no distension, no abdominal bruit, no pulsatile midline mass and no mass. There is no tenderness. There is no rebound and no guarding.  Musculoskeletal: He exhibits edema and tenderness.  LUE/LLE swelling  Lymphadenopathy:    He has no cervical adenopathy.  Neurological: He is alert. A cranial nerve deficit and sensory deficit is present. He exhibits abnormal muscle tone. Gait abnormal.  3/5 LUE strength; LLE 4/5 strength  Skin: Skin is warm and dry. No rash noted.  Psychiatric: He has a normal mood and affect. His behavior is normal. Thought content normal. His speech is slurred.     Labs reviewed: Admission on 05/17/2015, Discharged on 05/31/2015  Component Date Value Ref Range Status  . Glucose-Capillary 05/17/2015 94  65 - 99 mg/dL Final  . WBC 05/18/2015 11.8* 4.0 - 10.5 K/uL Final  . RBC 05/18/2015 5.37  4.22 - 5.81 MIL/uL Final  . Hemoglobin 05/18/2015 14.7  13.0 - 17.0 g/dL Final  . HCT 05/18/2015 44.8  39.0 - 52.0 % Final  . MCV 05/18/2015 83.4  78.0 - 100.0 fL Final  . MCH 05/18/2015 27.4  26.0 - 34.0 pg Final  . MCHC 05/18/2015 32.8  30.0 - 36.0 g/dL Final  . RDW 05/18/2015 13.1  11.5 - 15.5 % Final  . Platelets 05/18/2015 255  150 - 400 K/uL Final  . Neutrophils Relative % 05/18/2015 66   Final  . Neutro Abs 05/18/2015 7.8* 1.7 - 7.7 K/uL Final  . Lymphocytes Relative 05/18/2015 25   Final  . Lymphs Abs 05/18/2015 2.9  0.7 - 4.0 K/uL Final  . Monocytes Relative 05/18/2015 7   Final  . Monocytes Absolute 05/18/2015 0.8  0.1 - 1.0 K/uL Final  . Eosinophils Relative 05/18/2015 2   Final  . Eosinophils Absolute 05/18/2015 0.2  0.0 - 0.7 K/uL Final  . Basophils Relative 05/18/2015 0   Final  . Basophils Absolute 05/18/2015 0.0  0.0 - 0.1  K/uL Final  . Sodium 05/18/2015 139  135 - 145 mmol/L Final  . Potassium 05/18/2015 4.0  3.5 - 5.1 mmol/L Final  . Chloride 05/18/2015 102  101 - 111 mmol/L Final  . CO2 05/18/2015 25  22 - 32 mmol/L Final  . Glucose, Bld 05/18/2015 131* 65 - 99 mg/dL Final  . BUN 05/18/2015 14  6 - 20 mg/dL Final  . Creatinine, Ser 05/18/2015 1.14  0.61 - 1.24 mg/dL Final  . Calcium 05/18/2015 9.4  8.9 - 10.3 mg/dL Final  . Total Protein 05/18/2015 7.2  6.5 - 8.1 g/dL Final  . Albumin 05/18/2015 3.5  3.5 - 5.0 g/dL Final  . AST 05/18/2015 17  15 - 41 U/L Final  . ALT 05/18/2015 20  17 - 63 U/L Final  . Alkaline Phosphatase 05/18/2015 53  38 - 126 U/L Final  . Total Bilirubin 05/18/2015 0.8  0.3 - 1.2 mg/dL Final  . GFR calc non Af Amer 05/18/2015 >60  >60 mL/min Final  . GFR calc Af Amer 05/18/2015 >60  >60 mL/min Final   Comment: (NOTE) The eGFR has been calculated using the CKD EPI equation. This calculation has not been validated in all clinical situations. eGFR's persistently <60 mL/min signify possible Chronic Kidney Disease.   . Anion gap 05/18/2015 12  5 - 15 Final  . Glucose-Capillary 05/17/2015 219* 65 - 99 mg/dL Final  . Glucose-Capillary 05/18/2015 146* 65 - 99 mg/dL Final  . Specimen Description 05/18/2015 URINE, CLEAN CATCH   Final  . Special Requests 05/18/2015 NONE   Final  . Culture 05/18/2015 MULTIPLE SPECIES PRESENT, SUGGEST RECOLLECTION*  Final  . Report Status 05/18/2015 05/19/2015 FINAL   Final  . Color, Urine 05/18/2015 YELLOW  YELLOW Final  . APPearance 05/18/2015 CLEAR  CLEAR Final  . Specific Gravity, Urine 05/18/2015 1.013  1.005 - 1.030 Final  . pH 05/18/2015 6.0  5.0 - 8.0 Final  . Glucose, UA 05/18/2015 NEGATIVE  NEGATIVE mg/dL Final  . Hgb urine dipstick 05/18/2015 NEGATIVE  NEGATIVE  Final  . Bilirubin Urine 05/18/2015 NEGATIVE  NEGATIVE Final  . Ketones, ur 05/18/2015 NEGATIVE  NEGATIVE mg/dL Final  . Protein, ur 05/18/2015 NEGATIVE  NEGATIVE mg/dL Final  .  Nitrite 05/18/2015 NEGATIVE  NEGATIVE Final  . Leukocytes, UA 05/18/2015 NEGATIVE  NEGATIVE Final   MICROSCOPIC NOT DONE ON URINES WITH NEGATIVE PROTEIN, BLOOD, LEUKOCYTES, NITRITE, OR GLUCOSE <1000 mg/dL.  Marland Kitchen Glucose-Capillary 05/18/2015 144* 65 - 99 mg/dL Final  . Glucose-Capillary 05/18/2015 127* 65 - 99 mg/dL Final  . Glucose-Capillary 05/18/2015 97  65 - 99 mg/dL Final  . Comment 1 05/18/2015 Notify RN   Final  . Glucose-Capillary 05/19/2015 124* 65 - 99 mg/dL Final  . Comment 1 05/19/2015 Notify RN   Final  . Glucose-Capillary 05/19/2015 129* 65 - 99 mg/dL Final  . WBC 05/20/2015 10.8* 4.0 - 10.5 K/uL Final  . RBC 05/20/2015 5.59  4.22 - 5.81 MIL/uL Final  . Hemoglobin 05/20/2015 15.2  13.0 - 17.0 g/dL Final  . HCT 05/20/2015 46.0  39.0 - 52.0 % Final  . MCV 05/20/2015 82.3  78.0 - 100.0 fL Final  . MCH 05/20/2015 27.2  26.0 - 34.0 pg Final  . MCHC 05/20/2015 33.0  30.0 - 36.0 g/dL Final  . RDW 05/20/2015 13.0  11.5 - 15.5 % Final  . Platelets 05/20/2015 237  150 - 400 K/uL Final  . Neutrophils Relative % 05/20/2015 72   Final  . Neutro Abs 05/20/2015 7.8* 1.7 - 7.7 K/uL Final  . Lymphocytes Relative 05/20/2015 20   Final  . Lymphs Abs 05/20/2015 2.1  0.7 - 4.0 K/uL Final  . Monocytes Relative 05/20/2015 7   Final  . Monocytes Absolute 05/20/2015 0.8  0.1 - 1.0 K/uL Final  . Eosinophils Relative 05/20/2015 1   Final  . Eosinophils Absolute 05/20/2015 0.1  0.0 - 0.7 K/uL Final  . Basophils Relative 05/20/2015 0   Final  . Basophils Absolute 05/20/2015 0.0  0.0 - 0.1 K/uL Final  . Glucose-Capillary 05/19/2015 125* 65 - 99 mg/dL Final  . Glucose-Capillary 05/19/2015 89  65 - 99 mg/dL Final  . Glucose-Capillary 05/20/2015 119* 65 - 99 mg/dL Final  . Glucose-Capillary 05/20/2015 120* 65 - 99 mg/dL Final  . Glucose-Capillary 05/20/2015 121* 65 - 99 mg/dL Final  . Glucose-Capillary 05/20/2015 128* 65 - 99 mg/dL Final  . Glucose-Capillary 05/21/2015 116* 65 - 99 mg/dL Final  .  Glucose-Capillary 05/21/2015 119* 65 - 99 mg/dL Final  . Glucose-Capillary 05/21/2015 117* 65 - 99 mg/dL Final  . Glucose-Capillary 05/21/2015 133* 65 - 99 mg/dL Final  . Sodium 05/22/2015 135  135 - 145 mmol/L Final  . Potassium 05/22/2015 4.1  3.5 - 5.1 mmol/L Final  . Chloride 05/22/2015 98* 101 - 111 mmol/L Final  . CO2 05/22/2015 28  22 - 32 mmol/L Final  . Glucose, Bld 05/22/2015 118* 65 - 99 mg/dL Final  . BUN 05/22/2015 15  6 - 20 mg/dL Final  . Creatinine, Ser 05/22/2015 1.05  0.61 - 1.24 mg/dL Final  . Calcium 05/22/2015 9.6  8.9 - 10.3 mg/dL Final  . GFR calc non Af Amer 05/22/2015 >60  >60 mL/min Final  . GFR calc Af Amer 05/22/2015 >60  >60 mL/min Final   Comment: (NOTE) The eGFR has been calculated using the CKD EPI equation. This calculation has not been validated in all clinical situations. eGFR's persistently <60 mL/min signify possible Chronic Kidney Disease.   . Anion gap 05/22/2015 9  5 - 15 Final  .  WBC 05/22/2015 11.7* 4.0 - 10.5 K/uL Final  . RBC 05/22/2015 5.65  4.22 - 5.81 MIL/uL Final  . Hemoglobin 05/22/2015 15.6  13.0 - 17.0 g/dL Final  . HCT 05/22/2015 47.0  39.0 - 52.0 % Final  . MCV 05/22/2015 83.2  78.0 - 100.0 fL Final  . MCH 05/22/2015 27.6  26.0 - 34.0 pg Final  . MCHC 05/22/2015 33.2  30.0 - 36.0 g/dL Final  . RDW 05/22/2015 12.8  11.5 - 15.5 % Final  . Platelets 05/22/2015 243  150 - 400 K/uL Final  . Neutrophils Relative % 05/22/2015 75   Final  . Neutro Abs 05/22/2015 8.8* 1.7 - 7.7 K/uL Final  . Lymphocytes Relative 05/22/2015 16   Final  . Lymphs Abs 05/22/2015 1.9  0.7 - 4.0 K/uL Final  . Monocytes Relative 05/22/2015 8   Final  . Monocytes Absolute 05/22/2015 0.9  0.1 - 1.0 K/uL Final  . Eosinophils Relative 05/22/2015 1   Final  . Eosinophils Absolute 05/22/2015 0.1  0.0 - 0.7 K/uL Final  . Basophils Relative 05/22/2015 0   Final  . Basophils Absolute 05/22/2015 0.0  0.0 - 0.1 K/uL Final  . Glucose-Capillary 05/22/2015 97  65 - 99  mg/dL Final  . Glucose-Capillary 05/22/2015 148* 65 - 99 mg/dL Final  . Comment 1 05/22/2015 Notify RN   Final  . Glucose-Capillary 05/22/2015 124* 65 - 99 mg/dL Final  . Glucose-Capillary 05/22/2015 86  65 - 99 mg/dL Final  . Glucose-Capillary 05/23/2015 88  65 - 99 mg/dL Final  . Specimen Description 05/23/2015 URINE, CLEAN CATCH   Final  . Special Requests 05/23/2015 NONE   Final  . Culture 05/23/2015 MULTIPLE SPECIES PRESENT, SUGGEST RECOLLECTION*  Final  . Report Status 05/23/2015 05/24/2015 FINAL   Final  . Glucose-Capillary 05/23/2015 105* 65 - 99 mg/dL Final  . Creatinine, Ser 05/24/2015 1.09  0.61 - 1.24 mg/dL Final  . GFR calc non Af Amer 05/24/2015 >60  >60 mL/min Final  . GFR calc Af Amer 05/24/2015 >60  >60 mL/min Final   Comment: (NOTE) The eGFR has been calculated using the CKD EPI equation. This calculation has not been validated in all clinical situations. eGFR's persistently <60 mL/min signify possible Chronic Kidney Disease.   . Glucose-Capillary 05/23/2015 131* 65 - 99 mg/dL Final  . Glucose-Capillary 05/23/2015 105* 65 - 99 mg/dL Final  . Glucose-Capillary 05/24/2015 91  65 - 99 mg/dL Final  . Glucose-Capillary 05/24/2015 140* 65 - 99 mg/dL Final  . Glucose-Capillary 05/24/2015 104* 65 - 99 mg/dL Final  . Glucose-Capillary 05/24/2015 119* 65 - 99 mg/dL Final  . Glucose-Capillary 05/25/2015 101* 65 - 99 mg/dL Final  . Glucose-Capillary 05/25/2015 94  65 - 99 mg/dL Final  . Glucose-Capillary 05/25/2015 142* 65 - 99 mg/dL Final  . Glucose-Capillary 05/25/2015 123* 65 - 99 mg/dL Final  . Glucose-Capillary 05/26/2015 112* 65 - 99 mg/dL Final  . Glucose-Capillary 05/26/2015 107* 65 - 99 mg/dL Final  . Glucose-Capillary 05/26/2015 160* 65 - 99 mg/dL Final  . Glucose-Capillary 05/26/2015 108* 65 - 99 mg/dL Final  . Glucose-Capillary 05/27/2015 94  65 - 99 mg/dL Final  . Glucose-Capillary 05/27/2015 136* 65 - 99 mg/dL Final  . Glucose-Capillary 05/27/2015 97  65 - 99  mg/dL Final  . Glucose-Capillary 05/27/2015 117* 65 - 99 mg/dL Final  . Glucose-Capillary 05/28/2015 108* 65 - 99 mg/dL Final  . Glucose-Capillary 05/28/2015 147* 65 - 99 mg/dL Final  . Glucose-Capillary 05/28/2015 148* 65 - 99  mg/dL Final  . Glucose-Capillary 05/28/2015 117* 65 - 99 mg/dL Final  . Glucose-Capillary 05/29/2015 99  65 - 99 mg/dL Final  . WBC 05/29/2015 9.9  4.0 - 10.5 K/uL Final  . RBC 05/29/2015 5.50  4.22 - 5.81 MIL/uL Final  . Hemoglobin 05/29/2015 14.9  13.0 - 17.0 g/dL Final  . HCT 05/29/2015 45.6  39.0 - 52.0 % Final  . MCV 05/29/2015 82.9  78.0 - 100.0 fL Final  . MCH 05/29/2015 27.1  26.0 - 34.0 pg Final  . MCHC 05/29/2015 32.7  30.0 - 36.0 g/dL Final  . RDW 05/29/2015 12.3  11.5 - 15.5 % Final  . Platelets 05/29/2015 293  150 - 400 K/uL Final  . Sodium 05/29/2015 137  135 - 145 mmol/L Final  . Potassium 05/29/2015 3.8  3.5 - 5.1 mmol/L Final  . Chloride 05/29/2015 98* 101 - 111 mmol/L Final  . CO2 05/29/2015 29  22 - 32 mmol/L Final  . Glucose, Bld 05/29/2015 84  65 - 99 mg/dL Final  . BUN 05/29/2015 15  6 - 20 mg/dL Final  . Creatinine, Ser 05/29/2015 1.12  0.61 - 1.24 mg/dL Final  . Calcium 05/29/2015 10.0  8.9 - 10.3 mg/dL Final  . GFR calc non Af Amer 05/29/2015 >60  >60 mL/min Final  . GFR calc Af Amer 05/29/2015 >60  >60 mL/min Final   Comment: (NOTE) The eGFR has been calculated using the CKD EPI equation. This calculation has not been validated in all clinical situations. eGFR's persistently <60 mL/min signify possible Chronic Kidney Disease.   . Anion gap 05/29/2015 10  5 - 15 Final  . Glucose-Capillary 05/29/2015 84  65 - 99 mg/dL Final  . Glucose-Capillary 05/29/2015 129* 65 - 99 mg/dL Final  . Glucose-Capillary 05/29/2015 105* 65 - 99 mg/dL Final  . Glucose-Capillary 05/30/2015 98  65 - 99 mg/dL Final  . Glucose-Capillary 05/30/2015 119* 65 - 99 mg/dL Final  . Creatinine, Ser 05/31/2015 1.13  0.61 - 1.24 mg/dL Final  . GFR calc non Af Amer  05/31/2015 >60  >60 mL/min Final  . GFR calc Af Amer 05/31/2015 >60  >60 mL/min Final   Comment: (NOTE) The eGFR has been calculated using the CKD EPI equation. This calculation has not been validated in all clinical situations. eGFR's persistently <60 mL/min signify possible Chronic Kidney Disease.   . Glucose-Capillary 05/30/2015 146* 65 - 99 mg/dL Final  . Glucose-Capillary 05/30/2015 125* 65 - 99 mg/dL Final  . Glucose-Capillary 05/31/2015 93  65 - 99 mg/dL Final  . Glucose-Capillary 05/31/2015 122* 65 - 99 mg/dL Final  Admission on 05/14/2015, Discharged on 05/17/2015  Component Date Value Ref Range Status  . Prothrombin Time 05/14/2015 13.8  11.6 - 15.2 seconds Final  . INR 05/14/2015 1.04  0.00 - 1.49 Final  . aPTT 05/14/2015 32  24 - 37 seconds Final  . WBC 05/14/2015 8.6  4.0 - 10.5 K/uL Final  . RBC 05/14/2015 5.82* 4.22 - 5.81 MIL/uL Final  . Hemoglobin 05/14/2015 15.6  13.0 - 17.0 g/dL Final  . HCT 05/14/2015 48.7  39.0 - 52.0 % Final  . MCV 05/14/2015 83.7  78.0 - 100.0 fL Final  . MCH 05/14/2015 26.8  26.0 - 34.0 pg Final  . MCHC 05/14/2015 32.0  30.0 - 36.0 g/dL Final  . RDW 05/14/2015 13.2  11.5 - 15.5 % Final  . Platelets 05/14/2015 212  150 - 400 K/uL Final  . Neutrophils Relative % 05/14/2015 74  Final  . Neutro Abs 05/14/2015 6.4  1.7 - 7.7 K/uL Final  . Lymphocytes Relative 05/14/2015 21   Final  . Lymphs Abs 05/14/2015 1.8  0.7 - 4.0 K/uL Final  . Monocytes Relative 05/14/2015 4   Final  . Monocytes Absolute 05/14/2015 0.3  0.1 - 1.0 K/uL Final  . Eosinophils Relative 05/14/2015 1   Final  . Eosinophils Absolute 05/14/2015 0.1  0.0 - 0.7 K/uL Final  . Basophils Relative 05/14/2015 0   Final  . Basophils Absolute 05/14/2015 0.0  0.0 - 0.1 K/uL Final  . Sodium 05/14/2015 141  135 - 145 mmol/L Final  . Potassium 05/14/2015 4.0  3.5 - 5.1 mmol/L Final  . Chloride 05/14/2015 106  101 - 111 mmol/L Final  . CO2 05/14/2015 21* 22 - 32 mmol/L Final  . Glucose,  Bld 05/14/2015 154* 65 - 99 mg/dL Final  . BUN 05/14/2015 12  6 - 20 mg/dL Final  . Creatinine, Ser 05/14/2015 0.99  0.61 - 1.24 mg/dL Final  . Calcium 05/14/2015 9.4  8.9 - 10.3 mg/dL Final  . Total Protein 05/14/2015 8.1  6.5 - 8.1 g/dL Final  . Albumin 05/14/2015 3.8  3.5 - 5.0 g/dL Final  . AST 05/14/2015 18  15 - 41 U/L Final  . ALT 05/14/2015 19  17 - 63 U/L Final  . Alkaline Phosphatase 05/14/2015 60  38 - 126 U/L Final  . Total Bilirubin 05/14/2015 0.7  0.3 - 1.2 mg/dL Final  . GFR calc non Af Amer 05/14/2015 >60  >60 mL/min Final  . GFR calc Af Amer 05/14/2015 >60  >60 mL/min Final   Comment: (NOTE) The eGFR has been calculated using the CKD EPI equation. This calculation has not been validated in all clinical situations. eGFR's persistently <60 mL/min signify possible Chronic Kidney Disease.   . Anion gap 05/14/2015 14  5 - 15 Final  . Troponin i, poc 05/14/2015 0.00  0.00 - 0.08 ng/mL Final  . Comment 3 05/14/2015          Final   Comment: Due to the release kinetics of cTnI, a negative result within the first hours of the onset of symptoms does not rule out myocardial infarction with certainty. If myocardial infarction is still suspected, repeat the test at appropriate intervals.   . Glucose-Capillary 05/14/2015 122* 65 - 99 mg/dL Final  . Comment 1 05/14/2015 Notify RN   Final  . Sodium 05/14/2015 142  135 - 145 mmol/L Final  . Potassium 05/14/2015 3.9  3.5 - 5.1 mmol/L Final  . Chloride 05/14/2015 107  101 - 111 mmol/L Final  . BUN 05/14/2015 15  6 - 20 mg/dL Final  . Creatinine, Ser 05/14/2015 0.90  0.61 - 1.24 mg/dL Final  . Glucose, Bld 05/14/2015 150* 65 - 99 mg/dL Final  . Calcium, Ion 05/14/2015 1.15  1.13 - 1.30 mmol/L Final  . TCO2 05/14/2015 23  0 - 100 mmol/L Final  . Hemoglobin 05/14/2015 18.7* 13.0 - 17.0 g/dL Final  . HCT 05/14/2015 55.0* 39.0 - 52.0 % Final  . Weight 05/15/2015 2384   Final  . Height 05/15/2015 68   Final  . BP 05/15/2015 127/83    Final  . Color, Urine 05/15/2015 YELLOW  YELLOW Final  . APPearance 05/15/2015 CLOUDY* CLEAR Final  . Specific Gravity, Urine 05/15/2015 1.022  1.005 - 1.030 Final  . pH 05/15/2015 5.5  5.0 - 8.0 Final  . Glucose, UA 05/15/2015 NEGATIVE  NEGATIVE mg/dL Final  .  Hgb urine dipstick 05/15/2015 NEGATIVE  NEGATIVE Final  . Bilirubin Urine 05/15/2015 NEGATIVE  NEGATIVE Final  . Ketones, ur 05/15/2015 NEGATIVE  NEGATIVE mg/dL Final  . Protein, ur 05/15/2015 NEGATIVE  NEGATIVE mg/dL Final  . Nitrite 05/15/2015 NEGATIVE  NEGATIVE Final  . Leukocytes, UA 05/15/2015 NEGATIVE  NEGATIVE Final  . TSH 05/14/2015 1.093  0.350 - 4.500 uIU/mL Final  . Glucose-Capillary 05/14/2015 226* 65 - 99 mg/dL Final  . Comment 1 05/14/2015 Notify RN   Final  . Comment 2 05/14/2015 Document in Chart   Final  . Glucose-Capillary 05/15/2015 137* 65 - 99 mg/dL Final  . Glucose-Capillary 05/15/2015 102* 65 - 99 mg/dL Final  . Comment 1 05/15/2015 Notify RN   Final  . Comment 2 05/15/2015 Document in Chart   Final  . Glucose-Capillary 05/15/2015 162* 65 - 99 mg/dL Final  . Comment 1 05/15/2015 Notify RN   Final  . Comment 2 05/15/2015 Document in Chart   Final  . Glucose-Capillary 05/15/2015 109* 65 - 99 mg/dL Final  . Comment 1 05/15/2015 Notify RN   Final  . Comment 2 05/15/2015 Document in Chart   Final  . Glucose-Capillary 05/16/2015 129* 65 - 99 mg/dL Final  . Comment 1 05/16/2015 Notify RN   Final  . Comment 2 05/16/2015 Document in Chart   Final  . Glucose-Capillary 05/16/2015 127* 65 - 99 mg/dL Final  . Comment 1 05/16/2015 Notify RN   Final  . Comment 2 05/16/2015 Document in Chart   Final  . Glucose-Capillary 05/16/2015 149* 65 - 99 mg/dL Final  . Comment 1 05/16/2015 Notify RN   Final  . Comment 2 05/16/2015 Document in Chart   Final  . Glucose-Capillary 05/16/2015 102* 65 - 99 mg/dL Final  . Glucose-Capillary 05/16/2015 159* 65 - 99 mg/dL Final  . Comment 1 05/16/2015 Notify RN   Final  . Comment 2  05/16/2015 Document in Chart   Final  . Glucose-Capillary 05/17/2015 119* 65 - 99 mg/dL Final  . Comment 1 05/17/2015 Notify RN   Final  . Comment 2 05/17/2015 Document in Chart   Final  . Glucose-Capillary 05/17/2015 157* 65 - 99 mg/dL Final  Office Visit on 05/12/2015  Component Date Value Ref Range Status  . Sodium 05/12/2015 140  135 - 146 mmol/L Final  . Potassium 05/12/2015 4.6  3.5 - 5.3 mmol/L Final  . Chloride 05/12/2015 103  98 - 110 mmol/L Final  . CO2 05/12/2015 25  20 - 31 mmol/L Final  . Glucose, Bld 05/12/2015 168* 65 - 99 mg/dL Final  . BUN 05/12/2015 15  7 - 25 mg/dL Final  . Creat 05/12/2015 1.00  0.70 - 1.25 mg/dL Final  . Total Bilirubin 05/12/2015 0.6  0.2 - 1.2 mg/dL Final  . Alkaline Phosphatase 05/12/2015 57  40 - 115 U/L Final  . AST 05/12/2015 16  10 - 35 U/L Final  . ALT 05/12/2015 16  9 - 46 U/L Final  . Total Protein 05/12/2015 7.4  6.1 - 8.1 g/dL Final  . Albumin 05/12/2015 4.3  3.6 - 5.1 g/dL Final  . Calcium 05/12/2015 9.4  8.6 - 10.3 mg/dL Final  . GFR, Est African American 05/12/2015 >89  >=60 mL/min Final  . GFR, Est Non African American 05/12/2015 81  >=60 mL/min Final   Comment:   The estimated GFR is a calculation valid for adults (>=64 years old) that uses the CKD-EPI algorithm to adjust for age and sex. It is  not to be used for children, pregnant women, hospitalized patients,    patients on dialysis, or with rapidly changing kidney function. According to the NKDEP, eGFR >89 is normal, 60-89 shows mild impairment, 30-59 shows moderate impairment, 15-29 shows severe impairment and <15 is ESRD.     . WBC 05/12/2015 8.2  3.8 - 10.8 K/uL Final  . RBC 05/12/2015 5.69  4.20 - 5.80 MIL/uL Final  . Hemoglobin 05/12/2015 15.3  13.2 - 17.1 g/dL Final  . HCT 05/12/2015 47.6  38.5 - 50.0 % Final  . MCV 05/12/2015 83.7  80.0 - 100.0 fL Final  . MCH 05/12/2015 26.9* 27.0 - 33.0 pg Final  . MCHC 05/12/2015 32.1  32.0 - 36.0 g/dL Final  . RDW  05/12/2015 13.6  11.0 - 15.0 % Final  . Platelets 05/12/2015 255  140 - 400 K/uL Final  . MPV 05/12/2015 8.7  7.5 - 12.5 fL Final  . Neutro Abs 05/12/2015 5412  1500 - 7800 cells/uL Final  . Lymphs Abs 05/12/2015 2050  850 - 3900 cells/uL Final  . Monocytes Absolute 05/12/2015 574  200 - 950 cells/uL Final  . Eosinophils Absolute 05/12/2015 164  15 - 500 cells/uL Final  . Basophils Absolute 05/12/2015 0  0 - 200 cells/uL Final  . Neutrophils Relative % 05/12/2015 66   Final  . Lymphocytes Relative 05/12/2015 25   Final  . Monocytes Relative 05/12/2015 7   Final  . Eosinophils Relative 05/12/2015 2   Final  . Basophils Relative 05/12/2015 0   Final  . Smear Review 05/12/2015 Criteria for review not met   Final   ** Please note change in unit of measure and reference range(s). **  . Hgb A1c MFr Bld 05/12/2015 7.2* <5.7 % Final   Comment:   For someone without known diabetes, a hemoglobin A1c value of 6.5% or greater indicates that they may have diabetes and this should be confirmed with a follow-up test.   For someone with known diabetes, a value <7% indicates that their diabetes is well controlled and a value greater than or equal to 7% indicates suboptimal control. A1c targets should be individualized based on duration of diabetes, age, comorbid conditions, and other considerations.   Currently, no consensus exists for use of hemoglobin A1c for diagnosis of diabetes for children.     . Mean Plasma Glucose 05/12/2015 160   Final  . Cholesterol 05/12/2015 216* 125 - 200 mg/dL Final  . Triglycerides 05/12/2015 111  <150 mg/dL Final  . HDL 05/12/2015 48  >=40 mg/dL Final  . Total CHOL/HDL Ratio 05/12/2015 4.5  <=5.0 Ratio Final  . VLDL 05/12/2015 22  <30 mg/dL Final  . LDL Cholesterol 05/12/2015 146* <130 mg/dL Final   Comment:   Total Cholesterol/HDL Ratio:CHD Risk                        Coronary Heart Disease Risk Table                                        Men       Women           1/2 Average Risk              3.4        3.3  Average Risk              5.0        4.4           2X Average Risk              9.6        7.1           3X Average Risk             23.4       11.0 Use the calculated Patient Ratio above and the CHD Risk table  to determine the patient's CHD Risk.   Jacquelyne Balint, Ur 05/12/2015 2.1  Not estab mg/dL Final   Comment: The ADA has defined abnormalities in albumin excretion as follows:           Category           Result                            (mcg/mg creatinine)                 Normal:    <30       Microalbuminuria:    30 - 299   Clinical albuminuria:    > or = 300   The ADA recommends that at least two of three specimens collected within a 3 - 6 month period be abnormal before considering a patient to be within a diagnostic category.       Dg Chest 2 View  05/14/2015  CLINICAL DATA:  60 year old male with acute shortness of breath. EXAM: CHEST  2 VIEW COMPARISON:  10/13/2007 chest radiograph FINDINGS: Upper limits normal heart size again noted as well as small peribronchial thickening. There is no evidence of focal airspace disease, pulmonary edema, suspicious pulmonary nodule/mass, pleural effusion, or pneumothorax. No acute bony abnormalities are identified. IMPRESSION: No active cardiopulmonary disease. Electronically Signed   By: Margarette Canada M.D.   On: 05/14/2015 21:59   Ct Head Wo Contrast  05/14/2015  CLINICAL DATA:  LEFT side weakness which has persisted since Saturday, history hypertension, diabetes mellitus, smoker EXAM: CT HEAD WITHOUT CONTRAST TECHNIQUE: Contiguous axial images were obtained from the base of the skull through the vertex without intravenous contrast. COMPARISON:  06/27/2013 FINDINGS: Generalized atrophy. Normal ventricular morphology. No midline shift or mass effect. Old lacunar infarct anterior LEFT basal ganglia and anterior limb of LEFT internal capsule. Old RIGHT periventricular white matter infarct  and frontal region. Small vessel chronic ischemic changes of deep cerebral white matter. No intracranial hemorrhage, mass lesion or evidence acute infarction. No extra-axial fluid collections. Tiny air-fluid level RIGHT maxillary sinus with minimal mucous in LEFT sphenoid sinus. Bones, mastoid air cells, and remaining sinuses unremarkable. IMPRESSION: Atrophy with small vessel chronic ischemic changes of deep cerebral white matter. Old lacunar infarcts RIGHT periventricular white matter and anterior LEFT basal ganglia/internal capsule. Electronically Signed   By: Lavonia Dana M.D.   On: 05/14/2015 14:54   Mr Brain Wo Contrast  05/15/2015  CLINICAL DATA:  New onset altered mental status/ confusion. Increased left-sided weakness/ numbness. EXAM: MRI HEAD WITHOUT CONTRAST TECHNIQUE: Multiplanar, multiecho pulse sequences of the brain and surrounding structures were obtained without intravenous contrast. COMPARISON:  05/14/2015 FINDINGS: Acute right lateral lenticulostriate territory infarct has minimally enlarged in the interim and extends from the posterior corona radiata to the posterior lentiform nucleus. No acute infarct is identified elsewhere.  Chronic infarcts are again seen in the corona radiata and basal ganglia bilaterally, with associated chronic blood products better demonstrated on the prior 3T study. There is no evidence of acute intracranial hemorrhage, mass, midline shift, or extra-axial fluid collection. Mild generalized cerebral atrophy is noted. Patchy T2 hyperintensities throughout the cerebral white matter bilaterally are unchanged and compatible with moderate chronic small vessel ischemic disease. Orbits are unremarkable. A small amount of fluid is again seen in the right maxillary sinus with increased, mild mucosal thickening. Mucosal thickening is mild in the right frontal and left sphenoid sinuses and moderate in the right ethmoid air cells, slightly increased. Mastoid air cells are clear.  Major intracranial vascular flow voids are preserved. IMPRESSION: 1. Minimal enlargement of acute right corona radiata/ basal ganglia region infarct. 2. Moderate chronic small vessel ischemic changes as above. Electronically Signed   By: Logan Bores M.D.   On: 05/15/2015 10:16   Mr Brain Wo Contrast  05/14/2015  CLINICAL DATA:  60 year old diabetic hypertensive male fell in shower. Left-sided weakness started yesterday. Subsequent encounter. EXAM: MRI HEAD WITHOUT CONTRAST TECHNIQUE: Multiplanar, multiecho pulse sequences of the brain and surrounding structures were obtained without intravenous contrast. COMPARISON:  05/14/2015 CT.  06/28/2013 MR. FINDINGS: Small acute nonhemorrhagic infarct posterior right corona radiata extending to the posterior superior aspect of the posterior limb of the right internal capsule. Remote left lenticular nucleus/ caudate infarct with blood-stained encephalomalacia. Remote mid right corona radiata infarct with blood-stained encephalomalacia. No other areas of intracranial hemorrhage. Moderate to marked chronic microvascular changes. Mild global atrophy without hydrocephalus. No intracranial mass lesion noted on this unenhanced exam. Major intracranial vascular structures are patent. Paranasal sinus mucosal thickening. Nasal turbinate mucosal thickening. Minimal exophthalmos otherwise orbital structures unremarkable. Mild spinal stenosis C3-4 with prominent left facet joint degenerative changes. Minimal sclerosis C2 and C3 vertebral body without significant change. Cervical medullary junction unremarkable. IMPRESSION: Small acute nonhemorrhagic infarct posterior right corona radiata extending to the posterior superior aspect of the posterior limb of the right internal capsule. Remote infarcts and chronic microvascular changes as noted above. Global atrophy. Paranasal sinus mucosal thickening. Electronically Signed   By: Genia Del M.D.   On: 05/14/2015 16:54   Dg Chest Port 1  View  05/23/2015  CLINICAL DATA:  Leukocytosis. EXAM: PORTABLE CHEST 1 VIEW COMPARISON:  05/14/2015 FINDINGS: The heart size and mediastinal contours are within normal limits. Both lungs are clear. The visualized skeletal structures are unremarkable. IMPRESSION: No active disease. Electronically Signed   By: Lajean Manes M.D.   On: 05/23/2015 09:51   Mr Jodene Nam Head/brain Wo Cm  05/14/2015  CLINICAL DATA:  Acute infarct. EXAM: MRA HEAD WITHOUT CONTRAST TECHNIQUE: Angiographic images of the Circle of Willis were obtained using MRA technique without intravenous contrast. COMPARISON:  06/28/2013 FINDINGS: Symmetric carotid and vertebral arteries. Symmetric and normal pattern vertebrobasilar branching. No communicating branches are noted. No proximal occlusion. No flow limiting stenosis in the carotid, vertebral, or basilar arteries. Shallow outpouching from the right supraclinoid ICA projecting inferiorly is usually unresolved infundibulum given shape. There is atherosclerotic type irregularity of bilateral MCA branches with high-grade and progressive stenosis of inferior division right M2 branch. Proximal high-grade narrowing of the first branch left MCA is stable from prior. No notable posterior circulation stenosis. IMPRESSION: 1. No acute arterial finding. 2. Intracranial atherosclerosis with high-grade and progressive right M2 segment stenosis compared to 2015. Electronically Signed   By: Monte Fantasia M.D.   On: 05/14/2015 20:46  Assessment/Plan   ICD-9-CM ICD-10-CM   1. History of stroke with current residual effects - with gait disturbance; dysarthria; left hemiparesis 438.9 I69.30   2. Vascular dementia, without behavioral disturbance 290.40 F01.50   3. Muscle spasticity 728.85 M62.49   4. Benign essential HTN 401.1 I10   5. Type 2 diabetes mellitus with other circulatory complication, with long-term current use of insulin (HCC)  E11.59     Z79.4   6. Hyperlipidemia LDL goal <100 272.4 E78.5     7.      Tobacco abuse  Cont current meds as ordered. Baclofen is prn and he will need to ask for it  PT/OT/ST as ordered  Fall precautions  F/u with specialists as scheduled  Smoking cessation discussed and highly urged  Cont CBGs as ordered  GOAL: short term rehab and d/c home when medically appropriate. Communicated with pt and nursing.  Will follow  Penny Frisbie S. Perlie Gold  [facility] [address] [place], [place] [address] [phone] Cell (Monday-Friday 8 AM - 5 PM) [phone] After 5 PM and follow prompts

## 2015-06-02 ENCOUNTER — Telehealth: Payer: Self-pay

## 2015-06-02 NOTE — Telephone Encounter (Signed)
Unable to complete Transitional Care Call due to patient be in [facility]. Spoke with Tammy at [facility] and she is advised of appointment with Dr. Posey Pronto on Friday June 2nd at 10:30am for Hospital Follow up for patient.

## 2015-06-09 ENCOUNTER — Encounter: Payer: Medicaid Other | Attending: Physical Medicine & Rehabilitation | Admitting: Physical Medicine & Rehabilitation

## 2015-06-09 ENCOUNTER — Encounter: Payer: Self-pay | Admitting: Physical Medicine & Rehabilitation

## 2015-06-09 VITALS — BP 100/50 | HR 72 | Resp 14

## 2015-06-09 DIAGNOSIS — I10 Essential (primary) hypertension: Secondary | ICD-10-CM

## 2015-06-09 DIAGNOSIS — E119 Type 2 diabetes mellitus without complications: Secondary | ICD-10-CM | POA: Diagnosis not present

## 2015-06-09 DIAGNOSIS — E785 Hyperlipidemia, unspecified: Secondary | ICD-10-CM | POA: Insufficient documentation

## 2015-06-09 DIAGNOSIS — R197 Diarrhea, unspecified: Secondary | ICD-10-CM

## 2015-06-09 DIAGNOSIS — F0390 Unspecified dementia without behavioral disturbance: Secondary | ICD-10-CM | POA: Diagnosis not present

## 2015-06-09 DIAGNOSIS — M62838 Other muscle spasm: Secondary | ICD-10-CM

## 2015-06-09 DIAGNOSIS — M6249 Contracture of muscle, multiple sites: Secondary | ICD-10-CM

## 2015-06-09 DIAGNOSIS — I69351 Hemiplegia and hemiparesis following cerebral infarction affecting right dominant side: Secondary | ICD-10-CM | POA: Insufficient documentation

## 2015-06-09 DIAGNOSIS — F17210 Nicotine dependence, cigarettes, uncomplicated: Secondary | ICD-10-CM | POA: Insufficient documentation

## 2015-06-09 DIAGNOSIS — R252 Cramp and spasm: Secondary | ICD-10-CM | POA: Insufficient documentation

## 2015-06-09 DIAGNOSIS — I69354 Hemiplegia and hemiparesis following cerebral infarction affecting left non-dominant side: Secondary | ICD-10-CM

## 2015-06-09 NOTE — Progress Notes (Signed)
Subjective:    Patient ID: DUVAN BENZIE, male    DOB: 1955-01-26, 60 y.o.   MRN: [id number]  HPI  60 y.o. RH-male with history of T2DM, HTN, L-MCA infarct 2015 without residual deficits presents for transitional care management after being discharged from CIR after right corona radiata infarct on 05/14/15. Admit date: 05/17/2015 Discharge date: 05/31/2015 Pt's family was not able to provide adequate support for the patient and he as discharged to SNF.  Since leaving the hospital, he states he is getting better, but it is a slow process.  He states recently he has been having diarrhea, going 2-3 times/day.  He CBGs are being checked at the facility.  He has not seen his PCP.  He states he feels he is thinking better.   DME: Provided by facility Therapies: 6/week Mobility: Walker and wheelchair for community  Pain Inventory Average Pain 0 Pain Right Now 0 My pain is no pain  In the last 24 hours, has pain interfered with the following? General activity 0 Relation with others 0 Enjoyment of life 0 What TIME of day is your pain at its worst? no pain Sleep (in general) Fair  Pain is worse with: no pain Pain improves with: no pain Relief from Meds: no pain  Mobility walk with assistance use a walker how many minutes can you walk? 30 ability to climb steps?  yes use a wheelchair  Function disabled: date disabled .  Neuro/Psych bowel control problems weakness numbness trouble walking  Prior Studies hospital f/u  Physicians involved in your care Primary care Dr. Gildardo Cranker hospital f/u   Family History  Problem Relation Age of Onset  . Hypertension Mother   . Hypertension Father    Social History   Social History  . Marital Status: Divorced    Spouse Name: N/A  . Number of Children: N/A  . Years of Education: N/A   Social History Main Topics  . Smoking status: Current Every Day Smoker -- 0.25 packs/day    Types: Cigarettes  . Smokeless tobacco: None    . Alcohol Use: No  . Drug Use: No  . Sexual Activity: Not Asked   Other Topics Concern  . None   Social History Narrative   Past Surgical History  Procedure Laterality Date  . Neck surgery     Past Medical History  Diagnosis Date  . Diabetes mellitus without complication ([place])   . Hypertension   . Hyperlipidemia    BP 100/50 mmHg  Pulse 72  Resp 14  SpO2 97%  Opioid Risk Score:   Fall Risk Score:  `1  Depression screen PHQ 2/9  Depression screen [facility] 2/9 06/09/2015 05/12/2015 02/28/2014  Decreased Interest 0 0 0  Down, Depressed, Hopeless 0 0 0  PHQ - 2 Score 0 0 0  Altered sleeping 2 - -  Tired, decreased energy 2 - -  Change in appetite 0 - -  Feeling bad or failure about yourself  0 - -  Trouble concentrating 0 - -  Moving slowly or fidgety/restless 0 - -  Suicidal thoughts 0 - -  PHQ-9 Score 4 - -     Review of Systems  Gastrointestinal: Positive for diarrhea.  Neurological: Positive for facial asymmetry, weakness and numbness.  All other systems reviewed and are negative.     Objective:   Physical Exam Constitutional: He appears well-developed and well-nourished. No distress. Vital signs reviewed. HENT:  Normocephalic and atraumatic.   Eyes: Conjunctivae and EOM  are normal.   Respiratory: Effort normal and breath sounds normal. He has no wheezes.   GI: Soft. Bowel sounds are normal. He exhibits no distension. Musculoskeletal: No edema. No tenderness. PROM WNL left shoulder Neurological: He is alert and oriented x3.  Mild dysarthria with mild left facial weakness. Motor: Left upper extremity: Shoulder abduction 4/5, elbow flexion/extension 4/5, hand grip 4/5               MAS 1/4 elbow flexors Left lower extremity and 4+/5 proximal to distal Right upper/right lower extremity 5/5 proximal to distal   Sensation: Diminished to light touch left dorsal hand  Skin: Skin is warm and dry. He is not diaphoretic.  Psychiatric: He has a normal mood and affect.  His behavior is normal. Thought content normal    Assessment & Plan:  60 y.o. RH-male with history of T2DM, HTN, L-MCA infarct 2015 without residual deficits presents for transitional care management after being discharged from CIR after right corona radiata infarct on 05/14/15.  1.  Hemiparesis as late effect of right corona radiata infarct on 05/14/15  Cont therapies  Follow up with Neurology   Cont meds  2. T2DM  Cont meds  Cont to monitor CBGs for secondary stroke prevention  3. Dyslipidemia:   Cont meds  Cont to monitor CBGs for secondary stroke prevention  4. Hx of HTN  Stable off meds  5.  Baseline dementia             Namenda started on 5/17, cont  Appears to be helping per pt  6. Spasticity in left elbow flexors  Cont Baclofen 5  7. Diarrhea  Monitor for fevers, signs/symptoms of infections  Workup if no improvement  RTC 3 months

## 2015-06-13 ENCOUNTER — Encounter: Payer: Self-pay | Admitting: Internal Medicine

## 2015-06-13 ENCOUNTER — Non-Acute Institutional Stay (SKILLED_NURSING_FACILITY): Payer: Medicaid Other | Admitting: Internal Medicine

## 2015-06-13 DIAGNOSIS — E1159 Type 2 diabetes mellitus with other circulatory complications: Secondary | ICD-10-CM

## 2015-06-13 DIAGNOSIS — Z720 Tobacco use: Secondary | ICD-10-CM | POA: Diagnosis not present

## 2015-06-13 DIAGNOSIS — Z794 Long term (current) use of insulin: Secondary | ICD-10-CM

## 2015-06-13 DIAGNOSIS — I10 Essential (primary) hypertension: Secondary | ICD-10-CM

## 2015-06-13 DIAGNOSIS — F0150 Vascular dementia without behavioral disturbance: Secondary | ICD-10-CM

## 2015-06-13 DIAGNOSIS — E785 Hyperlipidemia, unspecified: Secondary | ICD-10-CM

## 2015-06-13 DIAGNOSIS — M62838 Other muscle spasm: Secondary | ICD-10-CM

## 2015-06-13 DIAGNOSIS — I6930 Unspecified sequelae of cerebral infarction: Secondary | ICD-10-CM

## 2015-06-13 DIAGNOSIS — M6249 Contracture of muscle, multiple sites: Secondary | ICD-10-CM | POA: Diagnosis not present

## 2015-06-13 NOTE — Progress Notes (Signed)
DATE: 06/13/15  Location:  [facility] Room Number: 145 B Place of Service: SNF [phone])   Extended Emergency Contact Information Primary Emergency Contact: Antonieta Pert [place] of [place] Mobile Phone: [phone] Relation: Other Secondary Emergency Contact: Nada Libman [place] of [place] Phone: [phone] Mobile Phone: [phone] Relation: Son  Advanced Directive information Does patient have an advance directive?: No, Would patient like information on creating an advanced directive?: No - patient declined information FULL CODE Chief Complaint  Patient presents with  . Discharge Note    HPI:  60 yo male seen today for d/c from SNF. He will be going home with rolling walker. He continues to have gait dysfunctions, dysarthria and left hemiparesis. He states he is not ready to go home. His family has applied to [facility] program. CBG 101. No low BS reactions.   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 He presented to ED with weakness and repeat falls after being out of plavix x 1 month prior to presentation per hospital records. neurology was consulted.  MRI/MRA brain showed small acute nonhemorrhagic right infarct at corona radiata through posterior limb of internal capsule; global atrophy; remote/chronic small vessel disease. F/u MRI due to increased confusion revealed minimum enlargement. He went to inpt rehab x 2 weeks. He presented to SNF for short term rehab. left sided weakness improved with therapy. No dysphagia. (+) muscle spasms. No falls since admission.   DM - A1c 7.2%. He is insulin dependent and takes tradjenta and metformin  HTN - BP stable with diet. Takes ASA daily  Dementia - vascular type; no  behavioral disturbance. He takes namenda  Hx CVA in 2015/muscle spasticity - he was taking plavix and ASA but was out x 1 month prior to hospital admission. He takes baclofen for spastic muscles  Hyperlipidemia - currently on lipitor. LDL 146  Tobacco abuse - continues to smoke despite being counseled about dangers of smoking    Past Medical History  Diagnosis Date  . Diabetes mellitus without complication ([place])   . Hypertension   . Hyperlipidemia     Past Surgical History  Procedure Laterality Date  . Neck surgery      Patient Care Team: Micheline Chapman, NP as PCP - General (Family Medicine)  Social History   Social History  . Marital Status: Divorced    Spouse Name: N/A  . Number of Children: N/A  . Years of Education: N/A   Occupational History  . Not on file.   Social History Main Topics  . Smoking status: Current Every Day Smoker -- 0.25 packs/day    Types: Cigarettes  . Smokeless tobacco: Not on file  . Alcohol Use: No  . Drug Use: No  . Sexual Activity: Not on file   Other Topics Concern  . Not on file   Social History Narrative     reports that he has been smoking Cigarettes.  He has been smoking about 0.25 packs per day. He does not have any smokeless tobacco history on file. He reports that he does not drink alcohol or use illicit drugs.  Family History  Problem Relation Age of Onset  . Hypertension Mother   . Hypertension Father    No family status information on file.  Immunization History  Administered Date(s) Administered  . Pneumococcal Polysaccharide-23 02/28/2014  . Td 04/08/1998    No Known Allergies  Medications: Patient's Medications  New Prescriptions   No medications on file  Previous Medications   ACETAMINOPHEN (TYLENOL) 325 MG TABLET    Take 2 tablets (650 mg total) by mouth every 6 (six) hours as needed for mild pain.   ASPIRIN EC 81 MG EC TABLET    Take 1 tablet (81 mg total) by mouth daily.   ATORVASTATIN  (LIPITOR) 80 MG TABLET    Take 1 tablet (80 mg total) by mouth daily.   BACLOFEN (LIORESAL) 10 MG TABLET    Take 0.5 tablets (5 mg total) by mouth 3 (three) times daily.   CLOPIDOGREL (PLAVIX) 75 MG TABLET    Take 1 tablet (75 mg total) by mouth daily with breakfast.   INSULIN ASPART (NOVOLOG FLEXPEN) 100 UNIT/ML FLEXPEN    Inject as per sliding scale. If 1-149= 0, 150-600= 5 units before meals   LINAGLIPTIN (TRADJENTA) 5 MG TABS TABLET    Take 1 tablet (5 mg total) by mouth daily.   LISINOPRIL (PRINIVIL,ZESTRIL) 10 MG TABLET    Take 1 tablet (10 mg total) by mouth daily. Start after 05/21/2015.   MEMANTINE (NAMENDA) 5 MG TABLET    Take 1 tablet (5 mg total) by mouth daily.   MENTHOL-METHYL SALICYLATE (MUSCLE RUB) 10-15 % CREA    Apply 1 application topically as needed for muscle pain.   METFORMIN (GLUCOPHAGE) 500 MG TABLET    Take 1 tablet (500 mg total) by mouth 2 (two) times daily with a meal.   MULTIPLE VITAMINS-MINERALS (CENTRUM ADULTS PO)    Take by mouth.   SENNA-DOCUSATE (SENOKOT-S) 8.6-50 MG TABLET    Take 1 tablet by mouth at bedtime as needed for mild constipation.  Modified Medications   No medications on file  Discontinued Medications   No medications on file    Review of Systems  Unable to perform ROS: Other  expressive aphasia  Filed Vitals:   06/13/15 1054  BP: 126/74  Pulse: 74  Temp: 97.5 F (36.4 C)  TempSrc: Oral  Resp: 18  Height: '5\' 8"'$  (1.727 m)  Weight: 146 lb (66.225 kg)   Body mass index is 22.2 kg/(m^2).  Physical Exam  Constitutional: He appears well-developed and well-nourished. No distress.  Neurological: He is alert.  Skin: Skin is warm and dry. No rash noted.  Psychiatric: He has a normal mood and affect. His behavior is normal. Thought content normal. His speech is slurred.     Labs reviewed: Admission on 05/17/2015, Discharged on 05/31/2015  Component Date Value Ref Range Status  . Glucose-Capillary 05/17/2015 94  65 - 99 mg/dL Final  .  WBC 05/18/2015 11.8* 4.0 - 10.5 K/uL Final  . RBC 05/18/2015 5.37  4.22 - 5.81 MIL/uL Final  . Hemoglobin 05/18/2015 14.7  13.0 - 17.0 g/dL Final  . HCT 05/18/2015 44.8  39.0 - 52.0 % Final  . MCV 05/18/2015 83.4  78.0 - 100.0 fL Final  . MCH 05/18/2015 27.4  26.0 - 34.0 pg Final  . MCHC 05/18/2015 32.8  30.0 - 36.0 g/dL Final  . RDW 05/18/2015 13.1  11.5 - 15.5 % Final  . Platelets 05/18/2015 255  150 - 400 K/uL Final  . Neutrophils Relative % 05/18/2015 66   Final  . Neutro Abs 05/18/2015 7.8* 1.7 - 7.7 K/uL Final  . Lymphocytes Relative 05/18/2015 25   Final  .  Lymphs Abs 05/18/2015 2.9  0.7 - 4.0 K/uL Final  . Monocytes Relative 05/18/2015 7   Final  . Monocytes Absolute 05/18/2015 0.8  0.1 - 1.0 K/uL Final  . Eosinophils Relative 05/18/2015 2   Final  . Eosinophils Absolute 05/18/2015 0.2  0.0 - 0.7 K/uL Final  . Basophils Relative 05/18/2015 0   Final  . Basophils Absolute 05/18/2015 0.0  0.0 - 0.1 K/uL Final  . Sodium 05/18/2015 139  135 - 145 mmol/L Final  . Potassium 05/18/2015 4.0  3.5 - 5.1 mmol/L Final  . Chloride 05/18/2015 102  101 - 111 mmol/L Final  . CO2 05/18/2015 25  22 - 32 mmol/L Final  . Glucose, Bld 05/18/2015 131* 65 - 99 mg/dL Final  . BUN 05/18/2015 14  6 - 20 mg/dL Final  . Creatinine, Ser 05/18/2015 1.14  0.61 - 1.24 mg/dL Final  . Calcium 05/18/2015 9.4  8.9 - 10.3 mg/dL Final  . Total Protein 05/18/2015 7.2  6.5 - 8.1 g/dL Final  . Albumin 05/18/2015 3.5  3.5 - 5.0 g/dL Final  . AST 05/18/2015 17  15 - 41 U/L Final  . ALT 05/18/2015 20  17 - 63 U/L Final  . Alkaline Phosphatase 05/18/2015 53  38 - 126 U/L Final  . Total Bilirubin 05/18/2015 0.8  0.3 - 1.2 mg/dL Final  . GFR calc non Af Amer 05/18/2015 >60  >60 mL/min Final  . GFR calc Af Amer 05/18/2015 >60  >60 mL/min Final   Comment: (NOTE) The eGFR has been calculated using the CKD EPI equation. This calculation has not been validated in all clinical situations. eGFR's persistently <60 mL/min  signify possible Chronic Kidney Disease.   . Anion gap 05/18/2015 12  5 - 15 Final  . Glucose-Capillary 05/17/2015 219* 65 - 99 mg/dL Final  . Glucose-Capillary 05/18/2015 146* 65 - 99 mg/dL Final  . Specimen Description 05/18/2015 URINE, CLEAN CATCH   Final  . Special Requests 05/18/2015 NONE   Final  . Culture 05/18/2015 MULTIPLE SPECIES PRESENT, SUGGEST RECOLLECTION*  Final  . Report Status 05/18/2015 05/19/2015 FINAL   Final  . Color, Urine 05/18/2015 YELLOW  YELLOW Final  . APPearance 05/18/2015 CLEAR  CLEAR Final  . Specific Gravity, Urine 05/18/2015 1.013  1.005 - 1.030 Final  . pH 05/18/2015 6.0  5.0 - 8.0 Final  . Glucose, UA 05/18/2015 NEGATIVE  NEGATIVE mg/dL Final  . Hgb urine dipstick 05/18/2015 NEGATIVE  NEGATIVE Final  . Bilirubin Urine 05/18/2015 NEGATIVE  NEGATIVE Final  . Ketones, ur 05/18/2015 NEGATIVE  NEGATIVE mg/dL Final  . Protein, ur 05/18/2015 NEGATIVE  NEGATIVE mg/dL Final  . Nitrite 05/18/2015 NEGATIVE  NEGATIVE Final  . Leukocytes, UA 05/18/2015 NEGATIVE  NEGATIVE Final   MICROSCOPIC NOT DONE ON URINES WITH NEGATIVE PROTEIN, BLOOD, LEUKOCYTES, NITRITE, OR GLUCOSE <1000 mg/dL.  Marland Kitchen Glucose-Capillary 05/18/2015 144* 65 - 99 mg/dL Final  . Glucose-Capillary 05/18/2015 127* 65 - 99 mg/dL Final  . Glucose-Capillary 05/18/2015 97  65 - 99 mg/dL Final  . Comment 1 05/18/2015 Notify RN   Final  . Glucose-Capillary 05/19/2015 124* 65 - 99 mg/dL Final  . Comment 1 05/19/2015 Notify RN   Final  . Glucose-Capillary 05/19/2015 129* 65 - 99 mg/dL Final  . WBC 05/20/2015 10.8* 4.0 - 10.5 K/uL Final  . RBC 05/20/2015 5.59  4.22 - 5.81 MIL/uL Final  . Hemoglobin 05/20/2015 15.2  13.0 - 17.0 g/dL Final  . HCT 05/20/2015 46.0  39.0 - 52.0 % Final  .  MCV 05/20/2015 82.3  78.0 - 100.0 fL Final  . MCH 05/20/2015 27.2  26.0 - 34.0 pg Final  . MCHC 05/20/2015 33.0  30.0 - 36.0 g/dL Final  . RDW 05/20/2015 13.0  11.5 - 15.5 % Final  . Platelets 05/20/2015 237  150 - 400 K/uL  Final  . Neutrophils Relative % 05/20/2015 72   Final  . Neutro Abs 05/20/2015 7.8* 1.7 - 7.7 K/uL Final  . Lymphocytes Relative 05/20/2015 20   Final  . Lymphs Abs 05/20/2015 2.1  0.7 - 4.0 K/uL Final  . Monocytes Relative 05/20/2015 7   Final  . Monocytes Absolute 05/20/2015 0.8  0.1 - 1.0 K/uL Final  . Eosinophils Relative 05/20/2015 1   Final  . Eosinophils Absolute 05/20/2015 0.1  0.0 - 0.7 K/uL Final  . Basophils Relative 05/20/2015 0   Final  . Basophils Absolute 05/20/2015 0.0  0.0 - 0.1 K/uL Final  . Glucose-Capillary 05/19/2015 125* 65 - 99 mg/dL Final  . Glucose-Capillary 05/19/2015 89  65 - 99 mg/dL Final  . Glucose-Capillary 05/20/2015 119* 65 - 99 mg/dL Final  . Glucose-Capillary 05/20/2015 120* 65 - 99 mg/dL Final  . Glucose-Capillary 05/20/2015 121* 65 - 99 mg/dL Final  . Glucose-Capillary 05/20/2015 128* 65 - 99 mg/dL Final  . Glucose-Capillary 05/21/2015 116* 65 - 99 mg/dL Final  . Glucose-Capillary 05/21/2015 119* 65 - 99 mg/dL Final  . Glucose-Capillary 05/21/2015 117* 65 - 99 mg/dL Final  . Glucose-Capillary 05/21/2015 133* 65 - 99 mg/dL Final  . Sodium 05/22/2015 135  135 - 145 mmol/L Final  . Potassium 05/22/2015 4.1  3.5 - 5.1 mmol/L Final  . Chloride 05/22/2015 98* 101 - 111 mmol/L Final  . CO2 05/22/2015 28  22 - 32 mmol/L Final  . Glucose, Bld 05/22/2015 118* 65 - 99 mg/dL Final  . BUN 05/22/2015 15  6 - 20 mg/dL Final  . Creatinine, Ser 05/22/2015 1.05  0.61 - 1.24 mg/dL Final  . Calcium 05/22/2015 9.6  8.9 - 10.3 mg/dL Final  . GFR calc non Af Amer 05/22/2015 >60  >60 mL/min Final  . GFR calc Af Amer 05/22/2015 >60  >60 mL/min Final   Comment: (NOTE) The eGFR has been calculated using the CKD EPI equation. This calculation has not been validated in all clinical situations. eGFR's persistently <60 mL/min signify possible Chronic Kidney Disease.   . Anion gap 05/22/2015 9  5 - 15 Final  . WBC 05/22/2015 11.7* 4.0 - 10.5 K/uL Final  . RBC 05/22/2015  5.65  4.22 - 5.81 MIL/uL Final  . Hemoglobin 05/22/2015 15.6  13.0 - 17.0 g/dL Final  . HCT 05/22/2015 47.0  39.0 - 52.0 % Final  . MCV 05/22/2015 83.2  78.0 - 100.0 fL Final  . MCH 05/22/2015 27.6  26.0 - 34.0 pg Final  . MCHC 05/22/2015 33.2  30.0 - 36.0 g/dL Final  . RDW 05/22/2015 12.8  11.5 - 15.5 % Final  . Platelets 05/22/2015 243  150 - 400 K/uL Final  . Neutrophils Relative % 05/22/2015 75   Final  . Neutro Abs 05/22/2015 8.8* 1.7 - 7.7 K/uL Final  . Lymphocytes Relative 05/22/2015 16   Final  . Lymphs Abs 05/22/2015 1.9  0.7 - 4.0 K/uL Final  . Monocytes Relative 05/22/2015 8   Final  . Monocytes Absolute 05/22/2015 0.9  0.1 - 1.0 K/uL Final  . Eosinophils Relative 05/22/2015 1   Final  . Eosinophils Absolute 05/22/2015 0.1  0.0 - 0.7 K/uL  Final  . Basophils Relative 05/22/2015 0   Final  . Basophils Absolute 05/22/2015 0.0  0.0 - 0.1 K/uL Final  . Glucose-Capillary 05/22/2015 97  65 - 99 mg/dL Final  . Glucose-Capillary 05/22/2015 148* 65 - 99 mg/dL Final  . Comment 1 70/05/2589 Notify RN   Final  . Glucose-Capillary 05/22/2015 124* 65 - 99 mg/dL Final  . Glucose-Capillary 05/22/2015 86  65 - 99 mg/dL Final  . Glucose-Capillary 05/23/2015 88  65 - 99 mg/dL Final  . Specimen Description 05/23/2015 URINE, CLEAN CATCH   Final  . Special Requests 05/23/2015 NONE   Final  . Culture 05/23/2015 MULTIPLE SPECIES PRESENT, SUGGEST RECOLLECTION*  Final  . Report Status 05/23/2015 05/24/2015 FINAL   Final  . Glucose-Capillary 05/23/2015 105* 65 - 99 mg/dL Final  . Creatinine, Ser 05/24/2015 1.09  0.61 - 1.24 mg/dL Final  . GFR calc non Af Amer 05/24/2015 >60  >60 mL/min Final  . GFR calc Af Amer 05/24/2015 >60  >60 mL/min Final   Comment: (NOTE) The eGFR has been calculated using the CKD EPI equation. This calculation has not been validated in all clinical situations. eGFR's persistently <60 mL/min signify possible Chronic Kidney Disease.   . Glucose-Capillary 05/23/2015 131* 65  - 99 mg/dL Final  . Glucose-Capillary 05/23/2015 105* 65 - 99 mg/dL Final  . Glucose-Capillary 05/24/2015 91  65 - 99 mg/dL Final  . Glucose-Capillary 05/24/2015 140* 65 - 99 mg/dL Final  . Glucose-Capillary 05/24/2015 104* 65 - 99 mg/dL Final  . Glucose-Capillary 05/24/2015 119* 65 - 99 mg/dL Final  . Glucose-Capillary 05/25/2015 101* 65 - 99 mg/dL Final  . Glucose-Capillary 05/25/2015 94  65 - 99 mg/dL Final  . Glucose-Capillary 05/25/2015 142* 65 - 99 mg/dL Final  . Glucose-Capillary 05/25/2015 123* 65 - 99 mg/dL Final  . Glucose-Capillary 05/26/2015 112* 65 - 99 mg/dL Final  . Glucose-Capillary 05/26/2015 107* 65 - 99 mg/dL Final  . Glucose-Capillary 05/26/2015 160* 65 - 99 mg/dL Final  . Glucose-Capillary 05/26/2015 108* 65 - 99 mg/dL Final  . Glucose-Capillary 05/27/2015 94  65 - 99 mg/dL Final  . Glucose-Capillary 05/27/2015 136* 65 - 99 mg/dL Final  . Glucose-Capillary 05/27/2015 97  65 - 99 mg/dL Final  . Glucose-Capillary 05/27/2015 117* 65 - 99 mg/dL Final  . Glucose-Capillary 05/28/2015 108* 65 - 99 mg/dL Final  . Glucose-Capillary 05/28/2015 147* 65 - 99 mg/dL Final  . Glucose-Capillary 05/28/2015 148* 65 - 99 mg/dL Final  . Glucose-Capillary 05/28/2015 117* 65 - 99 mg/dL Final  . Glucose-Capillary 05/29/2015 99  65 - 99 mg/dL Final  . WBC 02/89/0228 9.9  4.0 - 10.5 K/uL Final  . RBC 05/29/2015 5.50  4.22 - 5.81 MIL/uL Final  . Hemoglobin 05/29/2015 14.9  13.0 - 17.0 g/dL Final  . HCT 40/69/8614 45.6  39.0 - 52.0 % Final  . MCV 05/29/2015 82.9  78.0 - 100.0 fL Final  . MCH 05/29/2015 27.1  26.0 - 34.0 pg Final  . MCHC 05/29/2015 32.7  30.0 - 36.0 g/dL Final  . RDW 83/07/3541 12.3  11.5 - 15.5 % Final  . Platelets 05/29/2015 293  150 - 400 K/uL Final  . Sodium 05/29/2015 137  135 - 145 mmol/L Final  . Potassium 05/29/2015 3.8  3.5 - 5.1 mmol/L Final  . Chloride 05/29/2015 98* 101 - 111 mmol/L Final  . CO2 05/29/2015 29  22 - 32 mmol/L Final  . Glucose, Bld 05/29/2015  84  65 - 99 mg/dL Final  .  BUN 05/29/2015 15  6 - 20 mg/dL Final  . Creatinine, Ser 05/29/2015 1.12  0.61 - 1.24 mg/dL Final  . Calcium 05/29/2015 10.0  8.9 - 10.3 mg/dL Final  . GFR calc non Af Amer 05/29/2015 >60  >60 mL/min Final  . GFR calc Af Amer 05/29/2015 >60  >60 mL/min Final   Comment: (NOTE) The eGFR has been calculated using the CKD EPI equation. This calculation has not been validated in all clinical situations. eGFR's persistently <60 mL/min signify possible Chronic Kidney Disease.   . Anion gap 05/29/2015 10  5 - 15 Final  . Glucose-Capillary 05/29/2015 84  65 - 99 mg/dL Final  . Glucose-Capillary 05/29/2015 129* 65 - 99 mg/dL Final  . Glucose-Capillary 05/29/2015 105* 65 - 99 mg/dL Final  . Glucose-Capillary 05/30/2015 98  65 - 99 mg/dL Final  . Glucose-Capillary 05/30/2015 119* 65 - 99 mg/dL Final  . Creatinine, Ser 05/31/2015 1.13  0.61 - 1.24 mg/dL Final  . GFR calc non Af Amer 05/31/2015 >60  >60 mL/min Final  . GFR calc Af Amer 05/31/2015 >60  >60 mL/min Final   Comment: (NOTE) The eGFR has been calculated using the CKD EPI equation. This calculation has not been validated in all clinical situations. eGFR's persistently <60 mL/min signify possible Chronic Kidney Disease.   . Glucose-Capillary 05/30/2015 146* 65 - 99 mg/dL Final  . Glucose-Capillary 05/30/2015 125* 65 - 99 mg/dL Final  . Glucose-Capillary 05/31/2015 93  65 - 99 mg/dL Final  . Glucose-Capillary 05/31/2015 122* 65 - 99 mg/dL Final  Admission on 05/14/2015, Discharged on 05/17/2015  Component Date Value Ref Range Status  . Prothrombin Time 05/14/2015 13.8  11.6 - 15.2 seconds Final  . INR 05/14/2015 1.04  0.00 - 1.49 Final  . aPTT 05/14/2015 32  24 - 37 seconds Final  . WBC 05/14/2015 8.6  4.0 - 10.5 K/uL Final  . RBC 05/14/2015 5.82* 4.22 - 5.81 MIL/uL Final  . Hemoglobin 05/14/2015 15.6  13.0 - 17.0 g/dL Final  . HCT 05/14/2015 48.7  39.0 - 52.0 % Final  . MCV 05/14/2015 83.7  78.0 - 100.0  fL Final  . MCH 05/14/2015 26.8  26.0 - 34.0 pg Final  . MCHC 05/14/2015 32.0  30.0 - 36.0 g/dL Final  . RDW 05/14/2015 13.2  11.5 - 15.5 % Final  . Platelets 05/14/2015 212  150 - 400 K/uL Final  . Neutrophils Relative % 05/14/2015 74   Final  . Neutro Abs 05/14/2015 6.4  1.7 - 7.7 K/uL Final  . Lymphocytes Relative 05/14/2015 21   Final  . Lymphs Abs 05/14/2015 1.8  0.7 - 4.0 K/uL Final  . Monocytes Relative 05/14/2015 4   Final  . Monocytes Absolute 05/14/2015 0.3  0.1 - 1.0 K/uL Final  . Eosinophils Relative 05/14/2015 1   Final  . Eosinophils Absolute 05/14/2015 0.1  0.0 - 0.7 K/uL Final  . Basophils Relative 05/14/2015 0   Final  . Basophils Absolute 05/14/2015 0.0  0.0 - 0.1 K/uL Final  . Sodium 05/14/2015 141  135 - 145 mmol/L Final  . Potassium 05/14/2015 4.0  3.5 - 5.1 mmol/L Final  . Chloride 05/14/2015 106  101 - 111 mmol/L Final  . CO2 05/14/2015 21* 22 - 32 mmol/L Final  . Glucose, Bld 05/14/2015 154* 65 - 99 mg/dL Final  . BUN 05/14/2015 12  6 - 20 mg/dL Final  . Creatinine, Ser 05/14/2015 0.99  0.61 - 1.24 mg/dL Final  . Calcium 05/14/2015 9.4  8.9 - 10.3 mg/dL Final  . Total Protein 05/14/2015 8.1  6.5 - 8.1 g/dL Final  . Albumin 05/14/2015 3.8  3.5 - 5.0 g/dL Final  . AST 05/14/2015 18  15 - 41 U/L Final  . ALT 05/14/2015 19  17 - 63 U/L Final  . Alkaline Phosphatase 05/14/2015 60  38 - 126 U/L Final  . Total Bilirubin 05/14/2015 0.7  0.3 - 1.2 mg/dL Final  . GFR calc non Af Amer 05/14/2015 >60  >60 mL/min Final  . GFR calc Af Amer 05/14/2015 >60  >60 mL/min Final   Comment: (NOTE) The eGFR has been calculated using the CKD EPI equation. This calculation has not been validated in all clinical situations. eGFR's persistently <60 mL/min signify possible Chronic Kidney Disease.   . Anion gap 05/14/2015 14  5 - 15 Final  . Troponin i, poc 05/14/2015 0.00  0.00 - 0.08 ng/mL Final  . Comment 3 05/14/2015          Final   Comment: Due to the release kinetics of  cTnI, a negative result within the first hours of the onset of symptoms does not rule out myocardial infarction with certainty. If myocardial infarction is still suspected, repeat the test at appropriate intervals.   . Glucose-Capillary 05/14/2015 122* 65 - 99 mg/dL Final  . Comment 1 05/14/2015 Notify RN   Final  . Sodium 05/14/2015 142  135 - 145 mmol/L Final  . Potassium 05/14/2015 3.9  3.5 - 5.1 mmol/L Final  . Chloride 05/14/2015 107  101 - 111 mmol/L Final  . BUN 05/14/2015 15  6 - 20 mg/dL Final  . Creatinine, Ser 05/14/2015 0.90  0.61 - 1.24 mg/dL Final  . Glucose, Bld 05/14/2015 150* 65 - 99 mg/dL Final  . Calcium, Ion 05/14/2015 1.15  1.13 - 1.30 mmol/L Final  . TCO2 05/14/2015 23  0 - 100 mmol/L Final  . Hemoglobin 05/14/2015 18.7* 13.0 - 17.0 g/dL Final  . HCT 05/14/2015 55.0* 39.0 - 52.0 % Final  . Weight 05/15/2015 2384   Final  . Height 05/15/2015 68   Final  . BP 05/15/2015 127/83   Final  . Color, Urine 05/15/2015 YELLOW  YELLOW Final  . APPearance 05/15/2015 CLOUDY* CLEAR Final  . Specific Gravity, Urine 05/15/2015 1.022  1.005 - 1.030 Final  . pH 05/15/2015 5.5  5.0 - 8.0 Final  . Glucose, UA 05/15/2015 NEGATIVE  NEGATIVE mg/dL Final  . Hgb urine dipstick 05/15/2015 NEGATIVE  NEGATIVE Final  . Bilirubin Urine 05/15/2015 NEGATIVE  NEGATIVE Final  . Ketones, ur 05/15/2015 NEGATIVE  NEGATIVE mg/dL Final  . Protein, ur 05/15/2015 NEGATIVE  NEGATIVE mg/dL Final  . Nitrite 05/15/2015 NEGATIVE  NEGATIVE Final  . Leukocytes, UA 05/15/2015 NEGATIVE  NEGATIVE Final  . TSH 05/14/2015 1.093  0.350 - 4.500 uIU/mL Final  . Glucose-Capillary 05/14/2015 226* 65 - 99 mg/dL Final  . Comment 1 05/14/2015 Notify RN   Final  . Comment 2 05/14/2015 Document in Chart   Final  . Glucose-Capillary 05/15/2015 137* 65 - 99 mg/dL Final  . Glucose-Capillary 05/15/2015 102* 65 - 99 mg/dL Final  . Comment 1 05/15/2015 Notify RN   Final  . Comment 2 05/15/2015 Document in Chart   Final  .  Glucose-Capillary 05/15/2015 162* 65 - 99 mg/dL Final  . Comment 1 05/15/2015 Notify RN   Final  . Comment 2 05/15/2015 Document in Chart   Final  . Glucose-Capillary 05/15/2015 109* 65 - 99 mg/dL Final  .  Comment 1 05/15/2015 Notify RN   Final  . Comment 2 05/15/2015 Document in Chart   Final  . Glucose-Capillary 05/16/2015 129* 65 - 99 mg/dL Final  . Comment 1 82/50/3117 Notify RN   Final  . Comment 2 05/16/2015 Document in Chart   Final  . Glucose-Capillary 05/16/2015 127* 65 - 99 mg/dL Final  . Comment 1 43/43/6205 Notify RN   Final  . Comment 2 05/16/2015 Document in Chart   Final  . Glucose-Capillary 05/16/2015 149* 65 - 99 mg/dL Final  . Comment 1 85/95/3796 Notify RN   Final  . Comment 2 05/16/2015 Document in Chart   Final  . Glucose-Capillary 05/16/2015 102* 65 - 99 mg/dL Final  . Glucose-Capillary 05/16/2015 159* 65 - 99 mg/dL Final  . Comment 1 26/70/5186 Notify RN   Final  . Comment 2 05/16/2015 Document in Chart   Final  . Glucose-Capillary 05/17/2015 119* 65 - 99 mg/dL Final  . Comment 1 36/35/6685 Notify RN   Final  . Comment 2 05/17/2015 Document in Chart   Final  . Glucose-Capillary 05/17/2015 157* 65 - 99 mg/dL Final  Office Visit on 05/12/2015  Component Date Value Ref Range Status  . Sodium 05/12/2015 140  135 - 146 mmol/L Final  . Potassium 05/12/2015 4.6  3.5 - 5.3 mmol/L Final  . Chloride 05/12/2015 103  98 - 110 mmol/L Final  . CO2 05/12/2015 25  20 - 31 mmol/L Final  . Glucose, Bld 05/12/2015 168* 65 - 99 mg/dL Final  . BUN 39/89/5373 15  7 - 25 mg/dL Final  . Creat 73/54/6149 1.00  0.70 - 1.25 mg/dL Final  . Total Bilirubin 05/12/2015 0.6  0.2 - 1.2 mg/dL Final  . Alkaline Phosphatase 05/12/2015 57  40 - 115 U/L Final  . AST 05/12/2015 16  10 - 35 U/L Final  . ALT 05/12/2015 16  9 - 46 U/L Final  . Total Protein 05/12/2015 7.4  6.1 - 8.1 g/dL Final  . Albumin 93/19/3658 4.3  3.6 - 5.1 g/dL Final  . Calcium 36/49/4027 9.4  8.6 - 10.3 mg/dL Final  .  GFR, Est African American 05/12/2015 >89  >=60 mL/min Final  . GFR, Est Non African American 05/12/2015 81  >=60 mL/min Final   Comment:   The estimated GFR is a calculation valid for adults (>=60 years old) that uses the CKD-EPI algorithm to adjust for age and sex. It is   not to be used for children, pregnant women, hospitalized patients,    patients on dialysis, or with rapidly changing kidney function. According to the NKDEP, eGFR >89 is normal, 60-89 shows mild impairment, 30-59 shows moderate impairment, 15-29 shows severe impairment and <15 is ESRD.     . WBC 05/12/2015 8.2  3.8 - 10.8 K/uL Final  . RBC 05/12/2015 5.69  4.20 - 5.80 MIL/uL Final  . Hemoglobin 05/12/2015 15.3  13.2 - 17.1 g/dL Final  . HCT 00/43/2971 47.6  38.5 - 50.0 % Final  . MCV 05/12/2015 83.7  80.0 - 100.0 fL Final  . MCH 05/12/2015 26.9* 27.0 - 33.0 pg Final  . MCHC 05/12/2015 32.1  32.0 - 36.0 g/dL Final  . RDW 02/46/8388 13.6  11.0 - 15.0 % Final  . Platelets 05/12/2015 255  140 - 400 K/uL Final  . MPV 05/12/2015 8.7  7.5 - 12.5 fL Final  . Neutro Abs 05/12/2015 5412  1500 - 7800 cells/uL Final  . Lymphs Abs 05/12/2015 2050  850 -  3900 cells/uL Final  . Monocytes Absolute 05/12/2015 574  200 - 950 cells/uL Final  . Eosinophils Absolute 05/12/2015 164  15 - 500 cells/uL Final  . Basophils Absolute 05/12/2015 0  0 - 200 cells/uL Final  . Neutrophils Relative % 05/12/2015 66   Final  . Lymphocytes Relative 05/12/2015 25   Final  . Monocytes Relative 05/12/2015 7   Final  . Eosinophils Relative 05/12/2015 2   Final  . Basophils Relative 05/12/2015 0   Final  . Smear Review 05/12/2015 Criteria for review not met   Final   ** Please note change in unit of measure and reference range(s). **  . Hgb A1c MFr Bld 05/12/2015 7.2* <5.7 % Final   Comment:   For someone without known diabetes, a hemoglobin A1c value of 6.5% or greater indicates that they may have diabetes and this should be confirmed with a  follow-up test.   For someone with known diabetes, a value <7% indicates that their diabetes is well controlled and a value greater than or equal to 7% indicates suboptimal control. A1c targets should be individualized based on duration of diabetes, age, comorbid conditions, and other considerations.   Currently, no consensus exists for use of hemoglobin A1c for diagnosis of diabetes for children.     . Mean Plasma Glucose 05/12/2015 160   Final  . Cholesterol 05/12/2015 216* 125 - 200 mg/dL Final  . Triglycerides 05/12/2015 111  <150 mg/dL Final  . HDL 05/12/2015 48  >=40 mg/dL Final  . Total CHOL/HDL Ratio 05/12/2015 4.5  <=5.0 Ratio Final  . VLDL 05/12/2015 22  <30 mg/dL Final  . LDL Cholesterol 05/12/2015 146* <130 mg/dL Final   Comment:   Total Cholesterol/HDL Ratio:CHD Risk                        Coronary Heart Disease Risk Table                                        Men       Women          1/2 Average Risk              3.4        3.3              Average Risk              5.0        4.4           2X Average Risk              9.6        7.1           3X Average Risk             23.4       11.0 Use the calculated Patient Ratio above and the CHD Risk table  to determine the patient's CHD Risk.   Jacquelyne Balint, Ur 05/12/2015 2.1  Not estab mg/dL Final   Comment: The ADA has defined abnormalities in albumin excretion as follows:           Category           Result                            (  mcg/mg creatinine)                 Normal:    <30       Microalbuminuria:    30 - 299   Clinical albuminuria:    > or = 300   The ADA recommends that at least two of three specimens collected within a 3 - 6 month period be abnormal before considering a patient to be within a diagnostic category.       Dg Chest 2 View  05/14/2015  CLINICAL DATA:  60 year old male with acute shortness of breath. EXAM: CHEST  2 VIEW COMPARISON:  10/13/2007 chest radiograph FINDINGS: Upper limits normal  heart size again noted as well as small peribronchial thickening. There is no evidence of focal airspace disease, pulmonary edema, suspicious pulmonary nodule/mass, pleural effusion, or pneumothorax. No acute bony abnormalities are identified. IMPRESSION: No active cardiopulmonary disease. Electronically Signed   By: Margarette Canada M.D.   On: 05/14/2015 21:59   Ct Head Wo Contrast  05/14/2015  CLINICAL DATA:  LEFT side weakness which has persisted since Saturday, history hypertension, diabetes mellitus, smoker EXAM: CT HEAD WITHOUT CONTRAST TECHNIQUE: Contiguous axial images were obtained from the base of the skull through the vertex without intravenous contrast. COMPARISON:  06/27/2013 FINDINGS: Generalized atrophy. Normal ventricular morphology. No midline shift or mass effect. Old lacunar infarct anterior LEFT basal ganglia and anterior limb of LEFT internal capsule. Old RIGHT periventricular white matter infarct and frontal region. Small vessel chronic ischemic changes of deep cerebral white matter. No intracranial hemorrhage, mass lesion or evidence acute infarction. No extra-axial fluid collections. Tiny air-fluid level RIGHT maxillary sinus with minimal mucous in LEFT sphenoid sinus. Bones, mastoid air cells, and remaining sinuses unremarkable. IMPRESSION: Atrophy with small vessel chronic ischemic changes of deep cerebral white matter. Old lacunar infarcts RIGHT periventricular white matter and anterior LEFT basal ganglia/internal capsule. Electronically Signed   By: Lavonia Dana M.D.   On: 05/14/2015 14:54   Mr Brain Wo Contrast  05/15/2015  CLINICAL DATA:  New onset altered mental status/ confusion. Increased left-sided weakness/ numbness. EXAM: MRI HEAD WITHOUT CONTRAST TECHNIQUE: Multiplanar, multiecho pulse sequences of the brain and surrounding structures were obtained without intravenous contrast. COMPARISON:  05/14/2015 FINDINGS: Acute right lateral lenticulostriate territory infarct has minimally  enlarged in the interim and extends from the posterior corona radiata to the posterior lentiform nucleus. No acute infarct is identified elsewhere. Chronic infarcts are again seen in the corona radiata and basal ganglia bilaterally, with associated chronic blood products better demonstrated on the prior 3T study. There is no evidence of acute intracranial hemorrhage, mass, midline shift, or extra-axial fluid collection. Mild generalized cerebral atrophy is noted. Patchy T2 hyperintensities throughout the cerebral white matter bilaterally are unchanged and compatible with moderate chronic small vessel ischemic disease. Orbits are unremarkable. A small amount of fluid is again seen in the right maxillary sinus with increased, mild mucosal thickening. Mucosal thickening is mild in the right frontal and left sphenoid sinuses and moderate in the right ethmoid air cells, slightly increased. Mastoid air cells are clear. Major intracranial vascular flow voids are preserved. IMPRESSION: 1. Minimal enlargement of acute right corona radiata/ basal ganglia region infarct. 2. Moderate chronic small vessel ischemic changes as above. Electronically Signed   By: Logan Bores M.D.   On: 05/15/2015 10:16   Mr Brain Wo Contrast  05/14/2015  CLINICAL DATA:  60 year old diabetic hypertensive male fell in shower. Left-sided weakness started yesterday. Subsequent encounter. EXAM:  MRI HEAD WITHOUT CONTRAST TECHNIQUE: Multiplanar, multiecho pulse sequences of the brain and surrounding structures were obtained without intravenous contrast. COMPARISON:  05/14/2015 CT.  06/28/2013 MR. FINDINGS: Small acute nonhemorrhagic infarct posterior right corona radiata extending to the posterior superior aspect of the posterior limb of the right internal capsule. Remote left lenticular nucleus/ caudate infarct with blood-stained encephalomalacia. Remote mid right corona radiata infarct with blood-stained encephalomalacia. No other areas of  intracranial hemorrhage. Moderate to marked chronic microvascular changes. Mild global atrophy without hydrocephalus. No intracranial mass lesion noted on this unenhanced exam. Major intracranial vascular structures are patent. Paranasal sinus mucosal thickening. Nasal turbinate mucosal thickening. Minimal exophthalmos otherwise orbital structures unremarkable. Mild spinal stenosis C3-4 with prominent left facet joint degenerative changes. Minimal sclerosis C2 and C3 vertebral body without significant change. Cervical medullary junction unremarkable. IMPRESSION: Small acute nonhemorrhagic infarct posterior right corona radiata extending to the posterior superior aspect of the posterior limb of the right internal capsule. Remote infarcts and chronic microvascular changes as noted above. Global atrophy. Paranasal sinus mucosal thickening. Electronically Signed   By: Genia Del M.D.   On: 05/14/2015 16:54   Dg Chest Port 1 View  05/23/2015  CLINICAL DATA:  Leukocytosis. EXAM: PORTABLE CHEST 1 VIEW COMPARISON:  05/14/2015 FINDINGS: The heart size and mediastinal contours are within normal limits. Both lungs are clear. The visualized skeletal structures are unremarkable. IMPRESSION: No active disease. Electronically Signed   By: Lajean Manes M.D.   On: 05/23/2015 09:51   Mr Jodene Nam Head/brain Wo Cm  05/14/2015  CLINICAL DATA:  Acute infarct. EXAM: MRA HEAD WITHOUT CONTRAST TECHNIQUE: Angiographic images of the Circle of Willis were obtained using MRA technique without intravenous contrast. COMPARISON:  06/28/2013 FINDINGS: Symmetric carotid and vertebral arteries. Symmetric and normal pattern vertebrobasilar branching. No communicating branches are noted. No proximal occlusion. No flow limiting stenosis in the carotid, vertebral, or basilar arteries. Shallow outpouching from the right supraclinoid ICA projecting inferiorly is usually unresolved infundibulum given shape. There is atherosclerotic type irregularity of  bilateral MCA branches with high-grade and progressive stenosis of inferior division right M2 branch. Proximal high-grade narrowing of the first branch left MCA is stable from prior. No notable posterior circulation stenosis. IMPRESSION: 1. No acute arterial finding. 2. Intracranial atherosclerosis with high-grade and progressive right M2 segment stenosis compared to 2015. Electronically Signed   By: Monte Fantasia M.D.   On: 05/14/2015 20:46     Assessment/Plan   ICD-9-CM ICD-10-CM   1. History of stroke with current residual effects 438.9 I69.30   2. Muscle spasticity 728.85 M62.49   3. Vascular dementia, without behavioral disturbance 290.40 F01.50   4. Type 2 diabetes mellitus with other circulatory complication, with long-term current use of insulin (HCC)  E11.59     Z79.4   5. Benign essential HTN 401.1 I10   6. Hyperlipidemia LDL goal <100 272.4 E78.5   7. Continuous tobacco abuse 305.1 Z72.0     Patient is being discharged with home health services:  None  Patient is being discharged with the following durable medical equipment:  Rolling walker  Patient has been advised to f/u with their PCP in 1-2 weeks to bring them up to date on their rehab stay.  They were provided with a 30 day supply of scripts for prescription medications and refills must be obtained from their PCP.  TIME SPENT (MINUTES): [place]. Lizzy Hamre, [facility] [address]  [place], [place] [address] [phone] Cell (Monday-Friday 8 AM - 5 PM) [phone] After 5 PM and follow prompts

## 2015-06-22 ENCOUNTER — Telehealth: Payer: Self-pay

## 2015-06-22 ENCOUNTER — Other Ambulatory Visit: Payer: Self-pay

## 2015-06-22 MED ORDER — GLUCOSE BLOOD VI STRP: ORAL_STRIP | Status: DC

## 2015-06-22 MED ORDER — ACCU-CHEK AVIVA PLUS W/DEVICE KIT: 1.0000 | PACK | Freq: Three times a day (TID) | Status: DC

## 2015-06-22 NOTE — Telephone Encounter (Signed)
rx for meter and test strips have been changed to meter that is covered by [facility] and sent into correct pharmacy. Thanks!

## 2015-06-22 NOTE — Telephone Encounter (Signed)
Pt is requesting a medication refill on his diabetic test strips. Thanks!

## 2015-06-22 NOTE — Telephone Encounter (Signed)
Test strips sent into pharmacy. Thanks!

## 2015-06-28 ENCOUNTER — Emergency Department (HOSPITAL_COMMUNITY)
Admission: EM | Admit: 2015-06-28 | Discharge: 2015-06-28 | Disposition: A | Discharge status: Home / Self Care | Payer: Medicaid Other | Attending: Emergency Medicine | Admitting: Emergency Medicine

## 2015-06-28 ENCOUNTER — Encounter (HOSPITAL_COMMUNITY): Payer: Self-pay | Admitting: Nurse Practitioner

## 2015-06-28 DIAGNOSIS — Z79899 Other long term (current) drug therapy: Secondary | ICD-10-CM | POA: Insufficient documentation

## 2015-06-28 DIAGNOSIS — E119 Type 2 diabetes mellitus without complications: Secondary | ICD-10-CM | POA: Diagnosis not present

## 2015-06-28 DIAGNOSIS — F17210 Nicotine dependence, cigarettes, uncomplicated: Secondary | ICD-10-CM | POA: Diagnosis not present

## 2015-06-28 DIAGNOSIS — I10 Essential (primary) hypertension: Secondary | ICD-10-CM | POA: Insufficient documentation

## 2015-06-28 DIAGNOSIS — Z794 Long term (current) use of insulin: Secondary | ICD-10-CM | POA: Diagnosis not present

## 2015-06-28 DIAGNOSIS — E785 Hyperlipidemia, unspecified: Secondary | ICD-10-CM | POA: Diagnosis not present

## 2015-06-28 DIAGNOSIS — R29810 Facial weakness: Secondary | ICD-10-CM | POA: Insufficient documentation

## 2015-06-28 DIAGNOSIS — Z8673 Personal history of transient ischemic attack (TIA), and cerebral infarction without residual deficits: Secondary | ICD-10-CM | POA: Diagnosis not present

## 2015-06-28 DIAGNOSIS — Z7982 Long term (current) use of aspirin: Secondary | ICD-10-CM | POA: Insufficient documentation

## 2015-06-28 DIAGNOSIS — R112 Nausea with vomiting, unspecified: Secondary | ICD-10-CM | POA: Insufficient documentation

## 2015-06-28 DIAGNOSIS — Z7984 Long term (current) use of oral hypoglycemic drugs: Secondary | ICD-10-CM | POA: Diagnosis not present

## 2015-06-28 LAB — COMPREHENSIVE METABOLIC PANEL
ALBUMIN: 4 g/dL (ref 3.5–5.0)
ALT: 24 U/L (ref 17–63)
ANION GAP: 11 (ref 5–15)
AST: 18 U/L (ref 15–41)
Alkaline Phosphatase: 61 U/L (ref 38–126)
BILIRUBIN TOTAL: 0.5 mg/dL (ref 0.3–1.2)
BUN: 15 mg/dL (ref 6–20)
CO2: 24 mmol/L (ref 22–32)
Calcium: 10.1 mg/dL (ref 8.9–10.3)
Chloride: 104 mmol/L (ref 101–111)
Creatinine, Ser: 1.06 mg/dL (ref 0.61–1.24)
GFR calc Af Amer: >60 mL/min (ref >60)
Glucose, Bld: 102 mg/dL — ABNORMAL HIGH (ref 65–99)
POTASSIUM: 4.2 mmol/L (ref 3.5–5.1)
Sodium: 139 mmol/L (ref 135–145)
TOTAL PROTEIN: 7.5 g/dL (ref 6.5–8.1)

## 2015-06-28 LAB — URINALYSIS, ROUTINE W REFLEX MICROSCOPIC
GLUCOSE, UA: NEGATIVE mg/dL
HGB URINE DIPSTICK: NEGATIVE
Ketones, ur: 40 mg/dL — AB
Leukocytes, UA: NEGATIVE
Nitrite: NEGATIVE
Protein, ur: NEGATIVE mg/dL
SPECIFIC GRAVITY, URINE: 1.024 (ref 1.005–1.030)
pH: 5.5 (ref 5.0–8.0)

## 2015-06-28 LAB — CBC
HEMATOCRIT: 45.4 % (ref 39.0–52.0)
HEMOGLOBIN: 14.6 g/dL (ref 13.0–17.0)
MCH: 26.5 pg (ref 26.0–34.0)
MCHC: 32.2 g/dL (ref 30.0–36.0)
MCV: 82.5 fL (ref 78.0–100.0)
PLATELETS: 250 10*3/uL (ref 150–400)
RBC: 5.5 MIL/uL (ref 4.22–5.81)
RDW: 12.8 % (ref 11.5–15.5)
WBC: 13.3 10*3/uL — AB (ref 4.0–10.5)

## 2015-06-28 MED ORDER — ONDANSETRON 4 MG PO TBDP: 4.0000 mg | ORAL_TABLET | Freq: Three times a day (TID) | ORAL | Status: DC | PRN

## 2015-06-28 NOTE — Discharge Instructions (Signed)

## 2015-06-28 NOTE — ED Provider Notes (Signed)
CSN: [id number]     Arrival date & time 06/28/15  1540 History   First MD Initiated Contact with Patient 06/28/15 1919     Chief Complaint  Patient presents with  . Emesis   HPI Patient presents with concerns of diarrhea and vomiting. Patient has had a stroke in May and patient states he has baseline weakness on his left side. He was also diagnosed with dementia but is alert and oriented 3 and able to provide history. He states that he has trouble ambulating but is not particularly worse all thought he does feel more fatigued right now but not proportionally worse on the left. Denies any new focal deficits. No dysarthria. No confusion. No recent head trauma or falls. He is having diarrhea since yesterday without any blood and only mild. Today vomited for approximately one hour with a lot of dry heaving but it has subsequently resolved and he is tolerating by mouth. He did vomit once yesterday. No other sick contacts. No fevers no abdominal pain. No history of abdominal surgeries. No else in the family is sick and no suspect foods. Denies any chest pain or shortness of breath.  Past Medical History  Diagnosis Date  . Diabetes mellitus without complication ([place])   . Hypertension   . Hyperlipidemia    Past Surgical History  Procedure Laterality Date  . Neck surgery     Family History  Problem Relation Age of Onset  . Hypertension Mother   . Hypertension Father    Social History  Substance Use Topics  . Smoking status: Current Every Day Smoker -- 0.25 packs/day    Types: Cigarettes  . Smokeless tobacco: None  . Alcohol Use: No    Review of Systems  Constitutional: Negative for fever.  Genitourinary: Negative for dysuria.  Allergic/Immunologic: Negative for immunocompromised state.  All other systems reviewed and are negative.     Allergies  Review of patient's allergies indicates no known allergies.  Home Medications   Prior to Admission medications   Medication Sig Start  Date End Date Taking? Authorizing Provider  acetaminophen (TYLENOL) 325 MG tablet Take 2 tablets (650 mg total) by mouth every 6 (six) hours as needed for mild pain. 05/30/15   Bary Leriche, PA-C  aspirin EC 81 MG EC tablet Take 1 tablet (81 mg total) by mouth daily. 05/30/15   Bary Leriche, PA-C  atorvastatin (LIPITOR) 80 MG tablet Take 1 tablet (80 mg total) by mouth daily. 05/30/15   Bary Leriche, PA-C  baclofen (LIORESAL) 10 MG tablet Take 0.5 tablets (5 mg total) by mouth 3 (three) times daily. 05/30/15   Bary Leriche, PA-C  Blood Glucose Monitoring Suppl (ACCU-CHEK AVIVA PLUS) w/Device KIT 1 each by Does not apply route 3 (three) times daily. 06/22/15   Micheline Chapman, NP  clopidogrel (PLAVIX) 75 MG tablet Take 1 tablet (75 mg total) by mouth daily with breakfast. 06/29/13   Thurnell Lose, MD  glucose blood (ACCU-CHEK AVIVA) test strip Use as instructed to check blood sugar three times daily. 06/22/15   Micheline Chapman, NP  glucose blood (TRUE METRIX BLOOD GLUCOSE TEST) test strip Use as instructed to check blood sugar three times daily 06/22/15   Micheline Chapman, NP  insulin aspart (NOVOLOG FLEXPEN) 100 UNIT/ML FlexPen Inject as per sliding scale. If 1-149= 0, 150-600= 5 units before meals    Historical Provider, MD  linagliptin (TRADJENTA) 5 MG TABS tablet Take 1 tablet (5 mg total) by  mouth daily. 05/30/15   Ivan Anchors Love, PA-C  lisinopril (PRINIVIL,ZESTRIL) 10 MG tablet Take 1 tablet (10 mg total) by mouth daily. Start after 05/21/2015. 05/17/15   Mercy Riding, MD  memantine (NAMENDA) 5 MG tablet Take 1 tablet (5 mg total) by mouth daily. 05/30/15   Bary Leriche, PA-C  Menthol-Methyl Salicylate (MUSCLE RUB) 10-15 % CREA Apply 1 application topically as needed for muscle pain. 05/30/15   Bary Leriche, PA-C  metFORMIN (GLUCOPHAGE) 500 MG tablet Take 1 tablet (500 mg total) by mouth 2 (two) times daily with a meal. 05/30/15   Ivan Anchors Love, PA-C  Multiple Vitamins-Minerals (CENTRUM ADULTS PO)  Take by mouth.    Historical Provider, MD  ondansetron (ZOFRAN ODT) 4 MG disintegrating tablet Take 1 tablet (4 mg total) by mouth every 8 (eight) hours as needed for nausea or vomiting. 06/28/15   Karma Greaser, MD  senna-docusate (SENOKOT-S) 8.6-50 MG tablet Take 1 tablet by mouth at bedtime as needed for mild constipation. 05/17/15   Mercy Riding, MD   BP 134/78 mmHg  Pulse 82  Temp(Src) 98.1 F (36.7 C) (Oral)  Resp 20  SpO2 98% Physical Exam  Constitutional: He is oriented to person, place, and time. He appears well-developed and well-nourished. No distress.  HENT:  Head: Normocephalic and atraumatic.  Left Ear: External ear normal.  Eyes: Conjunctivae are normal. Pupils are equal, round, and reactive to light. Right eye exhibits no discharge. Left eye exhibits no discharge.  Neck: Normal range of motion. Neck supple.  Cardiovascular: Normal rate and regular rhythm.   No murmur heard. Pulmonary/Chest: Effort normal and breath sounds normal. No respiratory distress.  Abdominal: Soft. Bowel sounds are normal. He exhibits no distension and no mass. There is no tenderness. There is no rebound and no guarding.  Neg murphys, neg cvat  Musculoskeletal: He exhibits no edema.  Neurological: He is alert and oriented to person, place, and time.  Skin: Skin is warm. He is not diaphoretic.  Psychiatric: He has a normal mood and affect.    CRANIAL NERVES: CN 2 (Optic): Visual fields grossly intact CN 3,4,6 (EOM): Pupils equal and reactive to light. Full extraocular eye movement without nystagmus. CN 5 (Trigeminal): Facial sensation is normal, no weakness of masticatory muscles. CN 7 (Facial): Minimal left sided weakness CN 8 (Auditory): Auditory acuity grossly normal. CN 9,10 (Glossophar): The uvula is midline, the palate elevates symmetrically. CN 11 (spinal access): Normal sternocleidomastoid and trapezius strength. CN 12 (Hypoglossal): The tongue is midline. No atrophy or  fasciculations.Marland Kitchen   MOTOR: Muscle Strength:  Strength 4+/5 and symmetric in the right upper and lower extremities, 4+ LLE strength, 4- LUE grip strength Muscle Tone: Tone and muscle bulk are normal in the upper and lower extremities.   REFLEXES: DTRs - 2+ and symmetrical in all four extremities  SENSATION: Intact to light touch in all four extremities  COORDINATION:   Intact finger-to-nose bilaterally   GAIT: Routine gait in without favoring or ataxia, ambulates slowly without assistance (baseline is with cane)   ED Course  Procedures (including critical care time) Labs Review Labs Reviewed  COMPREHENSIVE METABOLIC PANEL - Abnormal; Notable for the following:    Glucose, Bld 102 (*)    All other components within normal limits  CBC - Abnormal; Notable for the following:    WBC 13.3 (*)    All other components within normal limits  URINALYSIS, ROUTINE W REFLEX MICROSCOPIC (NOT AT [facility]) - Abnormal; Notable for  the following:    Bilirubin Urine SMALL (*)    Ketones, ur 40 (*)    All other components within normal limits    Imaging Review No results found. I have personally reviewed and evaluated these images and lab results as part of my medical decision-making.   EKG Interpretation None      MDM   Final diagnoses:  Non-intractable vomiting with nausea, vomiting of unspecified type    Review of records reveals patient recently discharged after a stroke in May but appears to have improved neurological status at this time from prior, with an improved left grip in his upper extremity and baseline left-sided weakness of his face. I do not suspect that he has had a new stroke and patient himself denies any new symptoms and states they are at baseline. Family did not notice the symptoms and told the tech in the hospital noted that he had a left-sided droop. Patient initially here just for vomiting and diarrhea. Suspect viral gastroenteritis. Abdomen is completely benign. She has  only vomited for about an hour today and only once yesterday, denies any chest pain and so doubt pneumomediastinum. Abdomen without tenderness and no vomiting with blood or diarrhea with blood. Patient has had no abdominal surgery so doubt small bowel obstruction. No fevers and no dysuria suggest infectious process. No recent head trauma to suggest intracranial disease. Patient's vitals are normal without tachycardia or hypotension to suggest significant volume decreased. He hasn't has only had diarrhea for 2 days and only a few times a day. Electrolytes unremarkable and no AKI. White blood cell count only mildly elevated. Doubt acute abdomen. UA with no evidence of infection. Strict return precautions. Follow-up with PCP tomorrow. EKG reviewed and normal intervals, Zofran ordered for nausea when necessary     Karma Greaser, MD 06/28/15 2019  Jola Schmidt, MD 06/29/15 [phone]

## 2015-06-28 NOTE — ED Notes (Signed)
Pt c/o 2 day history of vomiting, diarrhea. He denies any pain. States he has been able to tolerate some oral intake. He is alert and breathing easily

## 2015-07-20 ENCOUNTER — Emergency Department (HOSPITAL_COMMUNITY)
Admission: EM | Admit: 2015-07-20 | Discharge: 2015-07-21 | Disposition: A | Discharge status: Home / Self Care | Payer: Medicaid Other | Attending: Emergency Medicine | Admitting: Emergency Medicine

## 2015-07-20 ENCOUNTER — Encounter (HOSPITAL_COMMUNITY): Payer: Self-pay

## 2015-07-20 DIAGNOSIS — Z79899 Other long term (current) drug therapy: Secondary | ICD-10-CM | POA: Insufficient documentation

## 2015-07-20 DIAGNOSIS — F17210 Nicotine dependence, cigarettes, uncomplicated: Secondary | ICD-10-CM | POA: Diagnosis not present

## 2015-07-20 DIAGNOSIS — R197 Diarrhea, unspecified: Secondary | ICD-10-CM

## 2015-07-20 DIAGNOSIS — I10 Essential (primary) hypertension: Secondary | ICD-10-CM | POA: Diagnosis not present

## 2015-07-20 DIAGNOSIS — R112 Nausea with vomiting, unspecified: Secondary | ICD-10-CM

## 2015-07-20 DIAGNOSIS — Z7982 Long term (current) use of aspirin: Secondary | ICD-10-CM | POA: Diagnosis not present

## 2015-07-20 DIAGNOSIS — E119 Type 2 diabetes mellitus without complications: Secondary | ICD-10-CM | POA: Diagnosis not present

## 2015-07-20 DIAGNOSIS — E785 Hyperlipidemia, unspecified: Secondary | ICD-10-CM | POA: Diagnosis not present

## 2015-07-20 DIAGNOSIS — Z794 Long term (current) use of insulin: Secondary | ICD-10-CM | POA: Insufficient documentation

## 2015-07-20 DIAGNOSIS — Z7984 Long term (current) use of oral hypoglycemic drugs: Secondary | ICD-10-CM | POA: Insufficient documentation

## 2015-07-20 LAB — COMPREHENSIVE METABOLIC PANEL
ALBUMIN: 4.5 g/dL (ref 3.5–5.0)
ALT: 36 U/L (ref 17–63)
ANION GAP: 9 (ref 5–15)
AST: 27 U/L (ref 15–41)
Alkaline Phosphatase: 62 U/L (ref 38–126)
BUN: 14 mg/dL (ref 6–20)
CALCIUM: 9.7 mg/dL (ref 8.9–10.3)
CO2: 25 mmol/L (ref 22–32)
Chloride: 105 mmol/L (ref 101–111)
Creatinine, Ser: 1 mg/dL (ref 0.61–1.24)
GFR calc non Af Amer: >60 mL/min (ref >60)
GLUCOSE: 120 mg/dL — AB (ref 65–99)
POTASSIUM: 3.9 mmol/L (ref 3.5–5.1)
SODIUM: 139 mmol/L (ref 135–145)
TOTAL PROTEIN: 8 g/dL (ref 6.5–8.1)
Total Bilirubin: 1 mg/dL (ref 0.3–1.2)

## 2015-07-20 LAB — CBC
HEMATOCRIT: 46.3 % (ref 39.0–52.0)
HEMOGLOBIN: 15.2 g/dL (ref 13.0–17.0)
MCH: 27.5 pg (ref 26.0–34.0)
MCHC: 32.8 g/dL (ref 30.0–36.0)
MCV: 83.7 fL (ref 78.0–100.0)
Platelets: 251 10*3/uL (ref 150–400)
RBC: 5.53 MIL/uL (ref 4.22–5.81)
RDW: 13 % (ref 11.5–15.5)
WBC: 13.2 10*3/uL — ABNORMAL HIGH (ref 4.0–10.5)

## 2015-07-20 LAB — LIPASE, BLOOD: LIPASE: 26 U/L (ref 11–51)

## 2015-07-20 NOTE — ED Provider Notes (Signed)
CSN: [id number]     Arrival date & time 07/20/15  2130 History  By signing my name below, I, [facility], attest that this documentation has been prepared under the direction and in the presence of Billy Creamer, NP. Electronically Signed: Virgel Bouquet, ED Scribe. 07/20/2015. 11:54 PM.    Chief Complaint  Patient presents with  . Diarrhea    The history is provided by the patient. No language interpreter was used.   HPI Comments: Billy Torres is a 60 y.o. male with a hx of DM, HTN, and HLN who presents to the Emergency Department complaining of intermittent, moderate diarrhea onset 3 days ago. Pt states that he has had diarrhea for the past three days followed by nausea and emesis today. He reports generalized, gradually worsening weakness for the past few days. He has been unable to eat or drink secondary to emesis. He has taken Pepto-Bismal and Imodium today without relief. He notes CBGs in the 140s which is normal for him. Per wife, pt is followed through the [facility]. Denies abdominal pain or any other symptoms currently.  Past Medical History  Diagnosis Date  . Diabetes mellitus without complication ([place])   . Hypertension   . Hyperlipidemia    Past Surgical History  Procedure Laterality Date  . Neck surgery     Family History  Problem Relation Age of Onset  . Hypertension Mother   . Hypertension Father    Social History  Substance Use Topics  . Smoking status: Current Every Day Smoker -- 0.25 packs/day    Types: Cigarettes  . Smokeless tobacco: None  . Alcohol Use: No    Review of Systems  Gastrointestinal: Positive for nausea, vomiting and diarrhea. Negative for abdominal pain.  Neurological: Positive for weakness.  All other systems reviewed and are negative.     Allergies  Review of patient's allergies indicates no known allergies.  Home Medications   Prior to Admission medications   Medication Sig Start Date  End Date Taking? Authorizing Provider  aspirin EC 81 MG EC tablet Take 1 tablet (81 mg total) by mouth daily. 05/30/15  Yes Ivan Anchors Love, PA-C  atorvastatin (LIPITOR) 80 MG tablet Take 1 tablet (80 mg total) by mouth daily. 05/30/15  Yes Ivan Anchors Love, PA-C  baclofen (LIORESAL) 10 MG tablet Take 0.5 tablets (5 mg total) by mouth 3 (three) times daily. Patient taking differently: Take 10 mg by mouth 2 (two) times daily.  05/30/15  Yes Ivan Anchors Love, PA-C  bismuth subsalicylate (PEPTO BISMOL) 262 MG/15ML suspension Take 30 mLs by mouth every 6 (six) hours as needed for indigestion or diarrhea or loose stools.   Yes Historical Provider, MD  Blood Glucose Monitoring Suppl (ACCU-CHEK AVIVA PLUS) w/Device KIT 1 each by Does not apply route 3 (three) times daily. 06/22/15  Yes Micheline Chapman, NP  clopidogrel (PLAVIX) 75 MG tablet Take 1 tablet (75 mg total) by mouth daily with breakfast. 06/29/13  Yes Thurnell Lose, MD  glucose blood (ACCU-CHEK AVIVA) test strip Use as instructed to check blood sugar three times daily. 06/22/15  Yes Micheline Chapman, NP  glucose blood (TRUE METRIX BLOOD GLUCOSE TEST) test strip Use as instructed to check blood sugar three times daily 06/22/15  Yes Micheline Chapman, NP  insulin aspart (NOVOLOG FLEXPEN) 100 UNIT/ML FlexPen Inject as per sliding scale. If 1-149= 0, 150-600= 5 units before meals   Yes Historical Provider, MD  linagliptin (TRADJENTA) 5  MG TABS tablet Take 1 tablet (5 mg total) by mouth daily. 05/30/15  Yes Ivan Anchors Love, PA-C  lisinopril (PRINIVIL,ZESTRIL) 10 MG tablet Take 1 tablet (10 mg total) by mouth daily. Start after 05/21/2015. 05/17/15  Yes Mercy Riding, MD  loperamide (IMODIUM A-D) 2 MG tablet Take 2 mg by mouth 4 (four) times daily as needed for diarrhea or loose stools.   Yes Historical Provider, MD  memantine (NAMENDA) 5 MG tablet Take 1 tablet (5 mg total) by mouth daily. 05/30/15  Yes Ivan Anchors Love, PA-C  metFORMIN (GLUCOPHAGE) 500 MG tablet Take 1  tablet (500 mg total) by mouth 2 (two) times daily with a meal. 05/30/15  Yes Ivan Anchors Love, PA-C  Multiple Vitamins-Minerals (CENTRUM ADULTS PO) Take 1 tablet by mouth daily.    Yes Historical Provider, MD  acetaminophen (TYLENOL) 325 MG tablet Take 2 tablets (650 mg total) by mouth every 6 (six) hours as needed for mild pain. Patient not taking: Reported on 07/20/2015 05/30/15   Ivan Anchors Love, PA-C  Menthol-Methyl Salicylate (MUSCLE RUB) 10-15 % CREA Apply 1 application topically as needed for muscle pain. Patient not taking: Reported on 07/20/2015 05/30/15   Ivan Anchors Love, PA-C  ondansetron (ZOFRAN ODT) 4 MG disintegrating tablet Take 1 tablet (4 mg total) by mouth every 8 (eight) hours as needed for nausea or vomiting. 07/21/15   Billy Creamer, NP  senna-docusate (SENOKOT-S) 8.6-50 MG tablet Take 1 tablet by mouth at bedtime as needed for mild constipation. Patient not taking: Reported on 07/20/2015 05/17/15   Mercy Riding, MD   BP 121/74 mmHg  Pulse 87  Temp(Src) 98.3 F (36.8 C) (Oral)  Resp 16  SpO2 99% Physical Exam  Constitutional: He is oriented to person, place, and time. He appears well-developed and well-nourished. No distress.  HENT:  Head: Normocephalic and atraumatic.  Eyes: Conjunctivae are normal.  Neck: Normal range of motion.  Cardiovascular: Normal rate.   Pulmonary/Chest: Effort normal. No respiratory distress.  Musculoskeletal: Normal range of motion.  Neurological: He is alert and oriented to person, place, and time.  Skin: Skin is warm and dry.  Psychiatric: He has a normal mood and affect. His behavior is normal.  Nursing note and vitals reviewed.   ED Course  Procedures   DIAGNOSTIC STUDIES: Oxygen Saturation is 97% on RA, normal by my interpretation.    COORDINATION OF CARE: 11:07 PM Will order Zofran. Discussed treatment plan with pt at bedside and pt agreed to plan.   Labs Review Labs Reviewed  COMPREHENSIVE METABOLIC PANEL - Abnormal; Notable for the  following:    Glucose, Bld 120 (*)    All other components within normal limits  CBC - Abnormal; Notable for the following:    WBC 13.2 (*)    All other components within normal limits  LIPASE, BLOOD  URINALYSIS, ROUTINE W REFLEX MICROSCOPIC (NOT AT [facility])    Imaging Review No results found. I have personally reviewed and evaluated these images and lab results as part of my medical decision-making.   EKG Interpretation None     Patient is tolerating or oral fluids.  He's had no further episodes of diarrhea or vomiting.  Arriving in the emergency department.  He will be discharged home with prescription for Zofran MDM   Final diagnoses:  Diarrhea, unspecified type  Non-intractable vomiting with nausea, vomiting of unspecified type    I personally performed the services described in this documentation, which was scribed in my presence. The  recorded information has been reviewed and is accurate.   Billy Creamer, NP 07/21/15 [place], MD 07/21/15 [phone]

## 2015-07-20 NOTE — ED Notes (Signed)
Pt complains of diarrhea for three days and tonight started to vomit

## 2015-07-21 MED ORDER — ONDANSETRON HCL 4 MG/2ML IJ SOLN
4.0000 mg | Freq: Once | INTRAMUSCULAR | Status: AC
  Administered 2015-07-21: 4 mg via INTRAVENOUS
  Filled 2015-07-21: qty 2

## 2015-07-21 MED ORDER — ONDANSETRON 4 MG PO TBDP: 4.0000 mg | ORAL_TABLET | Freq: Three times a day (TID) | ORAL | Status: DC | PRN

## 2015-07-21 MED ORDER — SODIUM CHLORIDE 0.9 % IV BOLUS (SEPSIS)
1000.0000 mL | Freq: Once | INTRAVENOUS | Status: AC
  Administered 2015-07-21: 1000 mL via INTRAVENOUS

## 2015-07-21 NOTE — ED Notes (Signed)
Fluids provided to pt. 

## 2015-07-21 NOTE — ED Notes (Signed)
Pt has been told a urine sample is needed, urinal at the bedside  

## 2015-07-21 NOTE — ED Notes (Signed)
Pt and visitor expressed anger when this nurse came in to discharge pt; this nurse explained resources and follow up information and offered to get the NP for them to discuss their concerns with; pt and visitor refused this offer and verbalized that they would like to continue with discharge

## 2015-07-21 NOTE — ED Notes (Signed)
Pt tolerating oral fluids 

## 2015-08-02 ENCOUNTER — Encounter: Payer: Self-pay | Admitting: Podiatry

## 2015-08-02 ENCOUNTER — Ambulatory Visit (INDEPENDENT_AMBULATORY_CARE_PROVIDER_SITE_OTHER): Payer: Medicaid Other | Admitting: Podiatry

## 2015-08-02 VITALS — BP 128/96 | HR 69 | Resp 12

## 2015-08-02 DIAGNOSIS — M79675 Pain in left toe(s): Secondary | ICD-10-CM

## 2015-08-02 DIAGNOSIS — B351 Tinea unguium: Secondary | ICD-10-CM | POA: Diagnosis not present

## 2015-08-02 DIAGNOSIS — M79674 Pain in right toe(s): Secondary | ICD-10-CM | POA: Diagnosis not present

## 2015-08-02 NOTE — Patient Instructions (Signed)
Diabetes and Foot Care Diabetes may cause you to have problems because of poor blood supply (circulation) to your feet and legs. This may cause the skin on your feet to become thinner, break easier, and heal more slowly. Your skin may become dry, and the skin may peel and crack. You may also have nerve damage in your legs and feet causing decreased feeling in them. You may not notice minor injuries to your feet that could lead to infections or more serious problems. Taking care of your feet is one of the most important things you can do for yourself.  HOME CARE INSTRUCTIONS  Wear shoes at all times, even in the house. Do not go barefoot. Bare feet are easily injured.  Check your feet daily for blisters, cuts, and redness. If you cannot see the bottom of your feet, use a mirror or ask someone for help.  Wash your feet with warm water (do not use hot water) and mild soap. Then pat your feet and the areas between your toes until they are completely dry. Do not soak your feet as this can dry your skin.  Apply a moisturizing lotion or petroleum jelly (that does not contain alcohol and is unscented) to the skin on your feet and to dry, brittle toenails. Do not apply lotion between your toes.  Trim your toenails straight across. Do not dig under them or around the cuticle. File the edges of your nails with an emery board or nail file.  Do not cut corns or calluses or try to remove them with medicine.  Wear clean socks or stockings every day. Make sure they are not too tight. Do not wear knee-high stockings since they may decrease blood flow to your legs.  Wear shoes that fit properly and have enough cushioning. To break in new shoes, wear them for just a few hours a day. This prevents you from injuring your feet. Always look in your shoes before you put them on to be sure there are no objects inside.  Do not cross your legs. This may decrease the blood flow to your feet.  If you find a minor scrape,  cut, or break in the skin on your feet, keep it and the skin around it clean and dry. These areas may be cleansed with mild soap and water. Do not cleanse the area with peroxide, alcohol, or iodine.  When you remove an adhesive bandage, be sure not to damage the skin around it.  If you have a wound, look at it several times a day to make sure it is healing.  Do not use heating pads or hot water bottles. They may burn your skin. If you have lost feeling in your feet or legs, you may not know it is happening until it is too late.  Make sure your health care provider performs a complete foot exam at least annually or more often if you have foot problems. Report any cuts, sores, or bruises to your health care provider immediately. SEEK MEDICAL CARE IF:   You have an injury that is not healing.  You have cuts or breaks in the skin.  You have an ingrown nail.  You notice redness on your legs or feet.  You feel burning or tingling in your legs or feet.  You have pain or cramps in your legs and feet.  Your legs or feet are numb.  Your feet always feel cold. SEEK IMMEDIATE MEDICAL CARE IF:   There is increasing redness,   swelling, or pain in or around a wound.  There is a red line that goes up your leg.  Pus is coming from a wound.  You develop a fever or as directed by your health care provider.  You notice a bad smell coming from an ulcer or wound.   This information is not intended to replace advice given to you by your health care provider. Make sure you discuss any questions you have with your health care provider.   Document Released: 12/22/1999 Document Revised: 08/26/2012 Document Reviewed: 06/02/2012 Elsevier Interactive Patient Education 2016 [employer]vier Inc[employer]  

## 2015-08-02 NOTE — Progress Notes (Signed)
   Subjective:    Patient ID: Billy Torres, male    DOB: 1955-12-20, 60 y.o.   MRN: [id number]  HPI   This patient presents today with his daughter-in-law present in the treatment room. The patient is complaining of elongated, thickened toenails that have become more uncomfortable and difficult to trim in the past 4 months making shoe wearing and walking uncomfortable. Patient is no longer able to trim toenails because of the thickening within the nails as well as difficulty associated with a CVA.  Patient is diabetic and denies any history of ulceration, claudication or amputation  Review of Systems  Skin: Positive for color change.       Objective:   Physical Exam  Orientated 3  Vascular: No calf edema or calf tenderness bilaterally DP and PT pulses 2/4 bilaterally Capillary reflex immediate bilaterally  Neurological: sensationto 10 g monofilament wire intact 5/5 bilaterally Vibratory sensation intact bilaterally Ankle reflex equal and reactive bilaterally  Dermatological: No open skin lesions bilaterally The toenails are elongated, hypertrophic, discolored and tender to direct palpation 6-10  Musculoskeletal: Hammertoe 2-5 bilaterally Manual motor testing: Plantar flexion and dorsiflexion 5/5 bilaterally           Assessment & Plan:   Assessment: Satisfactory neurovascular status Diabetic without foot complications Symptomatic onychomycoses 6-10  Plan: Reviewed the results with patient and patient's daughter-in-law and recommended periodic debridement of the mycotic toenails and patient verbally consents. The toenails 6-10 were debrided mechanically and electrically without any bleeding  Reappoint 3 months

## 2015-08-06 ENCOUNTER — Other Ambulatory Visit: Payer: Self-pay | Admitting: Internal Medicine

## 2015-08-14 ENCOUNTER — Ambulatory Visit (INDEPENDENT_AMBULATORY_CARE_PROVIDER_SITE_OTHER): Payer: Medicaid Other | Admitting: Family Medicine

## 2015-08-14 ENCOUNTER — Encounter: Payer: Self-pay | Admitting: Family Medicine

## 2015-08-14 VITALS — BP 125/81 | HR 85 | Temp 98.1°F | Ht 68.0 in | Wt 142.0 lb

## 2015-08-14 DIAGNOSIS — E138 Other specified diabetes mellitus with unspecified complications: Secondary | ICD-10-CM

## 2015-08-14 DIAGNOSIS — Z114 Encounter for screening for human immunodeficiency virus [HIV]: Secondary | ICD-10-CM

## 2015-08-14 DIAGNOSIS — Z1159 Encounter for screening for other viral diseases: Secondary | ICD-10-CM | POA: Diagnosis not present

## 2015-08-14 DIAGNOSIS — Z125 Encounter for screening for malignant neoplasm of prostate: Secondary | ICD-10-CM

## 2015-08-14 DIAGNOSIS — Z1211 Encounter for screening for malignant neoplasm of colon: Secondary | ICD-10-CM

## 2015-08-14 LAB — CBC WITH DIFFERENTIAL/PLATELET
BASOS PCT: 0 %
Basophils Absolute: 0 cells/uL (ref 0–200)
EOS ABS: 146 {cells}/uL (ref 15–500)
Eosinophils Relative: 2 %
HCT: 43.4 % (ref 38.5–50.0)
HEMOGLOBIN: 13.8 g/dL (ref 13.2–17.1)
LYMPHS ABS: 2263 {cells}/uL (ref 850–3900)
Lymphocytes Relative: 31 %
MCH: 26.7 pg — ABNORMAL LOW (ref 27.0–33.0)
MCHC: 31.8 g/dL — ABNORMAL LOW (ref 32.0–36.0)
MCV: 84.1 fL (ref 80.0–100.0)
MONO ABS: 511 {cells}/uL (ref 200–950)
MPV: 8.5 fL (ref 7.5–12.5)
Monocytes Relative: 7 %
NEUTROS PCT: 60 %
Neutro Abs: 4380 cells/uL (ref 1500–7800)
Platelets: 242 10*3/uL (ref 140–400)
RBC: 5.16 MIL/uL (ref 4.20–5.80)
RDW: 13.5 % (ref 11.0–15.0)
WBC: 7.3 10*3/uL (ref 3.8–10.8)

## 2015-08-14 LAB — COMPLETE METABOLIC PANEL WITH GFR
ALBUMIN: 4.2 g/dL (ref 3.6–5.1)
ALK PHOS: 54 U/L (ref 40–115)
ALT: 22 U/L (ref 9–46)
AST: 13 U/L (ref 10–35)
BILIRUBIN TOTAL: 0.7 mg/dL (ref 0.2–1.2)
BUN: 16 mg/dL (ref 7–25)
CALCIUM: 9.3 mg/dL (ref 8.6–10.3)
CO2: 24 mmol/L (ref 20–31)
CREATININE: 0.92 mg/dL (ref 0.70–1.25)
Chloride: 105 mmol/L (ref 98–110)
GFR, Est African American: >89 mL/min (ref >=60)
GFR, Est Non African American: 89 mL/min (ref >=60)
GLUCOSE: 151 mg/dL — AB (ref 65–99)
Potassium: 4.5 mmol/L (ref 3.5–5.3)
SODIUM: 142 mmol/L (ref 135–146)
TOTAL PROTEIN: 6.9 g/dL (ref 6.1–8.1)

## 2015-08-14 LAB — POCT GLYCOSYLATED HEMOGLOBIN (HGB A1C): Hemoglobin A1C: 6.5

## 2015-08-14 MED ORDER — METOCLOPRAMIDE HCL 5 MG PO TABS: 5.0000 mg | ORAL_TABLET | Freq: Three times a day (TID) | ORAL | 1 refills | Status: DC

## 2015-08-15 ENCOUNTER — Telehealth: Payer: Self-pay

## 2015-08-15 LAB — HEPATITIS C ANTIBODY: HCV AB: NEGATIVE

## 2015-08-15 LAB — PSA: PSA: 8.7 ng/mL — AB (ref <=4.0)

## 2015-08-15 LAB — HIV ANTIBODY (ROUTINE TESTING W REFLEX): HIV 1&2 Ab, 4th Generation: NONREACTIVE

## 2015-08-15 NOTE — Telephone Encounter (Signed)
-----   Message from Micheline Chapman, NP sent at 08/15/2015  7:42 AM EDT ----- HIV negative. Glucose 151. CBC acceptable.PSA 8.7, (high) need to recheck in one month.

## 2015-08-15 NOTE — Telephone Encounter (Signed)
Called and spoke with patient advised of labs (elevated psa) and the need to recheck in 1 month. Patient will call back to schedule lab appointment. Thanks!

## 2015-08-16 NOTE — Progress Notes (Signed)
Billy Torres, is a 60 y.o. male  [id number]  [id number]  DOB - 27-Mar-1955  CC:  Chief Complaint  Patient presents with  . Follow-up    diabetes, blood sugar at home this morning 131, complains of vomiting 3-4 times a week in the evening, daughter in law thinks it maybe one of his medications causing the vomiting       HPI:.  Billy Torres is a 60 y.o. male here for follow-up diabetes.He has a history of recent CVA and is on Plavix. She is on atorvostatin 80. For diabetes he is on metformin 500 bid. He has Novolog for use as sliding scale but rarely uses. He is on lisinopril for BP and renal protection.His major complaint today is of frequent vomiting. It is reported he vomits 2-3 times a week, usually in the evening after dinner. They have not notice an association with any particular foods. He does suffer with constipation.   Health Maintenance: He is in need of Screening for HIV, Hep C, prostate cancer and colon cancer.  No Known Allergies Past Medical History:  Diagnosis Date  . Diabetes mellitus without complication ([place])   . Hyperlipidemia   . Hypertension    Current Outpatient Prescriptions on File Prior to Visit  Medication Sig Dispense Refill  . atorvastatin (LIPITOR) 80 MG tablet Take 1 tablet (80 mg total) by mouth daily.    . Blood Glucose Monitoring Suppl (ACCU-CHEK AVIVA PLUS) w/Device KIT 1 each by Does not apply route 3 (three) times daily. 1 kit 0  . clopidogrel (PLAVIX) 75 MG tablet Take 1 tablet (75 mg total) by mouth daily with breakfast. 30 tablet 0  . glucose blood (ACCU-CHEK AVIVA) test strip Use as instructed to check blood sugar three times daily. 100 each 12  . glucose blood (TRUE METRIX BLOOD GLUCOSE TEST) test strip Use as instructed to check blood sugar three times daily 100 each 12  . insulin aspart (NOVOLOG FLEXPEN) 100 UNIT/ML FlexPen Inject as per sliding scale. If 1-149= 0, 150-600= 5 units before meals    . lisinopril  (PRINIVIL,ZESTRIL) 10 MG tablet Take 1 tablet (10 mg total) by mouth daily. Start after 05/21/2015. 90 tablet 1  . memantine (NAMENDA) 5 MG tablet Take 1 tablet (5 mg total) by mouth daily.    . metFORMIN (GLUCOPHAGE) 500 MG tablet Take 1 tablet (500 mg total) by mouth 2 (two) times daily with a meal.    . Multiple Vitamins-Minerals (CENTRUM ADULTS PO) Take 1 tablet by mouth daily.     Marland Kitchen acetaminophen (TYLENOL) 325 MG tablet Take 2 tablets (650 mg total) by mouth every 6 (six) hours as needed for mild pain. (Patient not taking: Reported on 07/20/2015)    . aspirin EC 81 MG EC tablet Take 1 tablet (81 mg total) by mouth daily. (Patient not taking: Reported on 08/14/2015)    . baclofen (LIORESAL) 10 MG tablet Take 0.5 tablets (5 mg total) by mouth 3 (three) times daily. (Patient not taking: Reported on 08/14/2015)    . bismuth subsalicylate (PEPTO BISMOL) 262 MG/15ML suspension Take 30 mLs by mouth every 6 (six) hours as needed for indigestion or diarrhea or loose stools.    . linagliptin (TRADJENTA) 5 MG TABS tablet Take 1 tablet (5 mg total) by mouth daily. (Patient not taking: Reported on 08/14/2015) 30 tablet   . loperamide (IMODIUM A-D) 2 MG tablet Take 2 mg by mouth 4 (four) times daily as needed for diarrhea or loose stools.    Marland Kitchen  Menthol-Methyl Salicylate (MUSCLE RUB) 10-15 % CREA Apply 1 application topically as needed for muscle pain. (Patient not taking: Reported on 07/20/2015)  0  . ondansetron (ZOFRAN ODT) 4 MG disintegrating tablet Take 1 tablet (4 mg total) by mouth every 8 (eight) hours as needed for nausea or vomiting. (Patient not taking: Reported on 08/14/2015) 20 tablet 0  . senna-docusate (SENOKOT-S) 8.6-50 MG tablet Take 1 tablet by mouth at bedtime as needed for mild constipation. (Patient not taking: Reported on 07/20/2015) 30 tablet 0   No current facility-administered medications on file prior to visit.    Family History  Problem Relation Age of Onset  . Hypertension Mother   .  Hypertension Father    Social History   Social History  . Marital status: Divorced    Spouse name: N/A  . Number of children: N/A  . Years of education: N/A   Occupational History  . Not on file.   Social History Main Topics  . Smoking status: Former Smoker    Packs/day: 0.25    Types: Cigarettes  . Smokeless tobacco: Never Used  . Alcohol use No  . Drug use: No  . Sexual activity: Not on file   Other Topics Concern  . Not on file   Social History Narrative  . No narrative on file    Review of Systems: Constitutional: Negative for fever, chills, appetite change, weight loss,  Fatigue. Skin: Negative for rashes or lesions of concern. HENT: Negative for ear pain, ear discharge.nose bleeds Eyes: Negative for pain, discharge, redness, itching and visual disturbance. Neck: Negative for pain, stiffness Respiratory: Negative for cough, shortness of breath,   Cardiovascular: Negative for chest pain, palpitations and leg swelling. Gastrointestinal: Negative for abdominal pain, nausea, vomiting, diarrhea, constipations Genitourinary: Negative for dysuria, urgency, frequency, hematuria,  Musculoskeletal: Negative for back pain, joint pain, joint  swelling, and gait problem.Negative for weakness. Neurological: Negative for dizziness, tremors, seizures, syncope,   light-headedness, numbness and headaches.  Hematological: Negative for easy bruising or bleeding Psychiatric/Behavioral: Negative for depression, anxiety, decreased concentration, confusion   Objective:   Vitals:   08/14/15 1516  BP: 125/81  Pulse: 85  Temp: 98.1 F (36.7 C)    Physical Exam: Constitutional: Patient appears well-developed and well-nourished. No distress. HENT: Normocephalic, atraumatic, External right and left ear normal. Oropharynx is clear and moist.  Eyes: Conjunctivae and EOM are normal. PERRLA, no scleral icterus. Neck: Normal ROM. Neck supple. No lymphadenopathy, No thyromegaly. CVS:  RRR, S1/S2 +, no murmurs, no gallops, no rubs Pulmonary: Effort and breath sounds normal, no stridor, rhonchi, wheezes, rales.  Abdominal: Soft. Normoactive BS,, no distension, tenderness, rebound or guarding.  Musculoskeletal: Normal range of motion. No edema and no tenderness.  Neuro: Alert.Normal muscle tone coordination. Non-focal Skin: Skin is warm and dry. No rash noted. Not diaphoretic. No erythema. No pallor. Psychiatric: Normal mood and affect. Behavior, judgment, thought content normal.  Lab Results  Component Value Date   WBC 7.3 08/14/2015   HGB 13.8 08/14/2015   HCT 43.4 08/14/2015   MCV 84.1 08/14/2015   PLT 242 08/14/2015   Lab Results  Component Value Date   CREATININE 0.92 08/14/2015   BUN 16 08/14/2015   NA 142 08/14/2015   K 4.5 08/14/2015   CL 105 08/14/2015   CO2 24 08/14/2015    Lab Results  Component Value Date   HGBA1C 6.5 08/14/2015   Lipid Panel     Component Value Date/Time   CHOL 216 (H) 05/12/2015  0950   TRIG 111 05/12/2015 0950   HDL 48 05/12/2015 0950   CHOLHDL 4.5 05/12/2015 0950   VLDL 22 05/12/2015 0950   LDLCALC 146 (H) 05/12/2015 0950       Assessment and plan:   1. Diabetes mellitus of other type with complication ([place])  - POCT HgB A1C - COMPLETE METABOLIC PANEL WITH GFR - CBC with Differential  2. Screening for prostate cancer  - PSA  3. Need for hepatitis C screening test  - Hepatitis C Antibody  4. Screening for HIV (human immunodeficiency virus)  - HIV antibody (with reflex)  5. Colon cancer screening  - Ambulatory referral to Gastroenterology   Return in about 3 months (around 11/14/2015).  The patient was given clear instructions to go to ER or return to medical center if symptoms don't improve, worsen or new problems develop. The patient verbalized understanding.    Micheline Chapman FNP  08/16/2015, 2:09 PM

## 2015-08-18 ENCOUNTER — Ambulatory Visit: Payer: Self-pay | Admitting: Family Medicine

## 2015-09-08 ENCOUNTER — Encounter: Payer: Medicaid Other | Attending: Physical Medicine & Rehabilitation | Admitting: Physical Medicine & Rehabilitation

## 2015-09-08 DIAGNOSIS — R252 Cramp and spasm: Secondary | ICD-10-CM | POA: Insufficient documentation

## 2015-09-08 DIAGNOSIS — E785 Hyperlipidemia, unspecified: Secondary | ICD-10-CM | POA: Insufficient documentation

## 2015-09-08 DIAGNOSIS — E119 Type 2 diabetes mellitus without complications: Secondary | ICD-10-CM | POA: Insufficient documentation

## 2015-09-08 DIAGNOSIS — I69351 Hemiplegia and hemiparesis following cerebral infarction affecting right dominant side: Secondary | ICD-10-CM | POA: Insufficient documentation

## 2015-09-08 DIAGNOSIS — I10 Essential (primary) hypertension: Secondary | ICD-10-CM | POA: Insufficient documentation

## 2015-09-08 DIAGNOSIS — F17210 Nicotine dependence, cigarettes, uncomplicated: Secondary | ICD-10-CM | POA: Insufficient documentation

## 2015-09-08 DIAGNOSIS — F0390 Unspecified dementia without behavioral disturbance: Secondary | ICD-10-CM | POA: Insufficient documentation

## 2015-09-08 DIAGNOSIS — R197 Diarrhea, unspecified: Secondary | ICD-10-CM | POA: Insufficient documentation

## 2015-09-15 ENCOUNTER — Other Ambulatory Visit: Payer: Self-pay | Admitting: *Deleted

## 2015-09-15 ENCOUNTER — Other Ambulatory Visit: Payer: Self-pay | Admitting: Family Medicine

## 2015-09-15 ENCOUNTER — Encounter: Payer: Self-pay | Admitting: Family Medicine

## 2015-09-15 ENCOUNTER — Other Ambulatory Visit: Payer: Medicaid Other

## 2015-09-15 DIAGNOSIS — E088 Diabetes mellitus due to underlying condition with unspecified complications: Secondary | ICD-10-CM

## 2015-09-15 DIAGNOSIS — R972 Elevated prostate specific antigen [PSA]: Secondary | ICD-10-CM

## 2015-09-15 LAB — PSA: PSA: 8.7 ng/mL — ABNORMAL HIGH (ref <=4.0)

## 2015-09-15 MED ORDER — CLOPIDOGREL BISULFATE 75 MG PO TABS: 75.0000 mg | ORAL_TABLET | Freq: Every day | ORAL | 0 refills | Status: DC

## 2015-09-15 MED ORDER — LISINOPRIL 10 MG PO TABS: 10.0000 mg | ORAL_TABLET | Freq: Every day | ORAL | 1 refills | Status: DC

## 2015-09-15 MED ORDER — METFORMIN HCL 500 MG PO TABS: 500.0000 mg | ORAL_TABLET | Freq: Two times a day (BID) | ORAL | Status: DC

## 2015-09-15 MED ORDER — MEMANTINE HCL 5 MG PO TABS: 5.0000 mg | ORAL_TABLET | Freq: Every day | ORAL | Status: DC

## 2015-09-15 MED ORDER — ATORVASTATIN CALCIUM 80 MG PO TABS: 80.0000 mg | ORAL_TABLET | Freq: Every day | ORAL | Status: DC

## 2015-09-15 MED ORDER — INSULIN ASPART 100 UNIT/ML FLEXPEN: PEN_INJECTOR | SUBCUTANEOUS | 11 refills | Status: DC

## 2015-09-15 NOTE — Progress Notes (Signed)
Patients chronic medications have been refilled. Patients insulin, plavix has been routed to PCP for proper dosing and refill amount.

## 2015-09-18 ENCOUNTER — Other Ambulatory Visit: Payer: Self-pay | Admitting: Family Medicine

## 2015-09-18 DIAGNOSIS — R972 Elevated prostate specific antigen [PSA]: Secondary | ICD-10-CM

## 2015-09-18 NOTE — Progress Notes (Signed)
LM to CB

## 2015-09-19 DIAGNOSIS — B351 Tinea unguium: Secondary | ICD-10-CM

## 2015-09-20 ENCOUNTER — Telehealth: Payer: Self-pay

## 2015-09-20 ENCOUNTER — Other Ambulatory Visit: Payer: Self-pay | Admitting: Family Medicine

## 2015-09-20 ENCOUNTER — Other Ambulatory Visit: Payer: Self-pay | Admitting: Internal Medicine

## 2015-09-20 DIAGNOSIS — R972 Elevated prostate specific antigen [PSA]: Secondary | ICD-10-CM

## 2015-09-20 NOTE — Telephone Encounter (Signed)
Called, patient not available. Left message with wife to have patient call back when available. Thanks!

## 2015-09-28 ENCOUNTER — Telehealth: Payer: Self-pay

## 2015-09-28 MED ORDER — METFORMIN HCL 500 MG PO TABS: 500.0000 mg | ORAL_TABLET | Freq: Two times a day (BID) | ORAL | 3 refills | Status: DC

## 2015-09-28 NOTE — Telephone Encounter (Signed)
Refill for metformin sent into pharmacy. Suggest trying colace otc for stool softer. Thanks!

## 2015-09-29 ENCOUNTER — Telehealth: Payer: Self-pay

## 2015-09-29 NOTE — Telephone Encounter (Signed)
Received a fax from [facility] Patient is scheduled for 11/07/2015 @11am . With Dr. Gaynelle Arabian. Thanks!

## 2015-10-02 ENCOUNTER — Other Ambulatory Visit: Payer: Self-pay

## 2015-10-02 MED ORDER — ATORVASTATIN CALCIUM 80 MG PO TABS: 80.0000 mg | ORAL_TABLET | Freq: Every day | ORAL | 3 refills | Status: DC

## 2015-10-02 MED ORDER — MEMANTINE HCL 5 MG PO TABS: 5.0000 mg | ORAL_TABLET | Freq: Every day | ORAL | 3 refills | Status: DC

## 2015-10-02 NOTE — Telephone Encounter (Signed)
Refills for memantine and lipitor sent into pharmacy. Thanks!

## 2015-10-02 NOTE — Progress Notes (Signed)
Patient has appointment with Urology.

## 2015-11-01 ENCOUNTER — Ambulatory Visit: Payer: Medicaid Other | Admitting: Podiatry

## 2015-11-08 ENCOUNTER — Ambulatory Visit (INDEPENDENT_AMBULATORY_CARE_PROVIDER_SITE_OTHER): Payer: Medicaid Other | Admitting: Podiatry

## 2015-11-08 NOTE — Progress Notes (Signed)
ERRONEOUS ENCOUNTER-NO SHOW Billy Torres

## 2015-11-13 ENCOUNTER — Other Ambulatory Visit: Payer: Self-pay | Admitting: Internal Medicine

## 2015-11-13 ENCOUNTER — Other Ambulatory Visit: Payer: Self-pay

## 2015-11-13 MED ORDER — CLOPIDOGREL BISULFATE 75 MG PO TABS: 75.0000 mg | ORAL_TABLET | Freq: Every day | ORAL | 3 refills | Status: DC

## 2015-11-13 NOTE — Telephone Encounter (Signed)
Refill for Plavix sent into pharmacy. Thanks!

## 2015-11-14 ENCOUNTER — Other Ambulatory Visit: Payer: Self-pay

## 2015-11-14 MED ORDER — CLOPIDOGREL BISULFATE 75 MG PO TABS: 75.0000 mg | ORAL_TABLET | Freq: Every day | ORAL | 3 refills | Status: DC

## 2015-11-16 ENCOUNTER — Ambulatory Visit: Payer: Medicaid Other | Admitting: Family Medicine

## 2015-12-12 HISTORY — PX: PROSTATE BIOPSY: SHX241

## 2015-12-19 ENCOUNTER — Other Ambulatory Visit (HOSPITAL_COMMUNITY): Payer: Self-pay | Admitting: Urology

## 2015-12-19 DIAGNOSIS — C61 Malignant neoplasm of prostate: Secondary | ICD-10-CM

## 2016-01-04 ENCOUNTER — Ambulatory Visit (HOSPITAL_COMMUNITY)
Admission: RE | Admit: 2016-01-04 | Discharge: 2016-01-04 | Disposition: A | Discharge status: Home / Self Care | Payer: Medicare (Managed Care) | Source: Ambulatory Visit | Attending: Urology | Admitting: Urology

## 2016-01-04 ENCOUNTER — Encounter (HOSPITAL_COMMUNITY)
Admission: RE | Admit: 2016-01-04 | Discharge: 2016-01-04 | Disposition: A | Discharge status: Home / Self Care | Payer: Medicare (Managed Care) | Source: Ambulatory Visit | Attending: Urology | Admitting: Urology

## 2016-01-04 DIAGNOSIS — C61 Malignant neoplasm of prostate: Secondary | ICD-10-CM | POA: Diagnosis not present

## 2016-01-04 MED ORDER — TECHNETIUM TC 99M MEDRONATE IV KIT
21.6000 | PACK | Freq: Once | INTRAVENOUS | Status: AC | PRN
  Administered 2016-01-04: 21.6 via INTRAVENOUS

## 2016-01-23 ENCOUNTER — Ambulatory Visit: Payer: Medicaid Other | Admitting: Podiatry

## 2016-02-14 ENCOUNTER — Encounter: Payer: Self-pay | Admitting: Radiation Oncology

## 2016-02-22 ENCOUNTER — Other Ambulatory Visit: Payer: Self-pay | Admitting: Internal Medicine

## 2016-02-22 ENCOUNTER — Ambulatory Visit
Admission: RE | Admit: 2016-02-22 | Discharge: 2016-02-22 | Disposition: A | Discharge status: Home / Self Care | Payer: Medicaid Other | Source: Ambulatory Visit | Attending: Internal Medicine | Admitting: Internal Medicine

## 2016-02-22 DIAGNOSIS — G8929 Other chronic pain: Secondary | ICD-10-CM

## 2016-02-22 DIAGNOSIS — M79642 Pain in left hand: Principal | ICD-10-CM

## 2016-02-23 ENCOUNTER — Encounter: Payer: Self-pay | Admitting: Radiation Oncology

## 2016-02-23 NOTE — Progress Notes (Signed)
GU Location of Tumor / Histology: prostatic adenocarcinoma  If Prostate Cancer, Gleason Score is (3 + 4) and PSA is (10.4). Prostate volume: 35.7 cc.   Billy Torres presented he presented to a clinic for a check up and was told he had prostate problems October 2017.  Biopsies of prostate (if applicable) revealed:    Past/Anticipated interventions by urology, if any: prostate biopsy, CT abd and pelvis (neg), Bone scan (neg), referral to Dr. [name]  Past/Anticipated interventions by medical oncology, if any: no  Weight changes, if any: no  Bowel/Bladder complaints, if any: IPSS 12. Reports incontinence since stroke requiring 2 pads per day. Reports nocturia x1. Denies dysuria or hematuria.    Nausea/Vomiting, if any: no  Pain issues, if any:  no  SAFETY ISSUES:  Prior radiation? no  Pacemaker/ICD? no  Possible current pregnancy? no  Is the patient on methotrexate? no  Current Complaints / other details:  61 year old male. Divorced. Brother has prostate ca. NKDA.

## 2016-02-26 ENCOUNTER — Ambulatory Visit
Admission: RE | Admit: 2016-02-26 | Discharge: 2016-02-26 | Disposition: A | Discharge status: Home / Self Care | Payer: Medicare (Managed Care) | Source: Ambulatory Visit | Attending: Radiation Oncology | Admitting: Radiation Oncology

## 2016-02-26 ENCOUNTER — Ambulatory Visit: Payer: Medicare (Managed Care)

## 2016-02-26 DIAGNOSIS — Z7902 Long term (current) use of antithrombotics/antiplatelets: Secondary | ICD-10-CM | POA: Insufficient documentation

## 2016-02-26 DIAGNOSIS — R32 Unspecified urinary incontinence: Secondary | ICD-10-CM | POA: Insufficient documentation

## 2016-02-26 DIAGNOSIS — I10 Essential (primary) hypertension: Secondary | ICD-10-CM | POA: Insufficient documentation

## 2016-02-26 DIAGNOSIS — Z8042 Family history of malignant neoplasm of prostate: Secondary | ICD-10-CM | POA: Insufficient documentation

## 2016-02-26 DIAGNOSIS — Z8249 Family history of ischemic heart disease and other diseases of the circulatory system: Secondary | ICD-10-CM | POA: Insufficient documentation

## 2016-02-26 DIAGNOSIS — E785 Hyperlipidemia, unspecified: Secondary | ICD-10-CM | POA: Insufficient documentation

## 2016-02-26 DIAGNOSIS — Z794 Long term (current) use of insulin: Secondary | ICD-10-CM | POA: Insufficient documentation

## 2016-02-26 DIAGNOSIS — Z79899 Other long term (current) drug therapy: Secondary | ICD-10-CM | POA: Insufficient documentation

## 2016-02-26 DIAGNOSIS — Z87891 Personal history of nicotine dependence: Secondary | ICD-10-CM | POA: Insufficient documentation

## 2016-02-26 DIAGNOSIS — C61 Malignant neoplasm of prostate: Secondary | ICD-10-CM

## 2016-02-26 DIAGNOSIS — E119 Type 2 diabetes mellitus without complications: Secondary | ICD-10-CM | POA: Insufficient documentation

## 2016-02-26 HISTORY — DX: Malignant neoplasm of prostate: C61

## 2016-03-01 ENCOUNTER — Encounter: Payer: Self-pay | Admitting: Radiation Oncology

## 2016-03-11 ENCOUNTER — Telehealth: Payer: Self-pay | Admitting: Radiation Oncology

## 2016-03-11 ENCOUNTER — Ambulatory Visit
Admission: RE | Admit: 2016-03-11 | Discharge: 2016-03-11 | Disposition: A | Discharge status: Home / Self Care | Payer: Medicare (Managed Care) | Source: Ambulatory Visit | Attending: Radiation Oncology | Admitting: Radiation Oncology

## 2016-03-11 ENCOUNTER — Encounter: Payer: Self-pay | Admitting: Radiation Oncology

## 2016-03-11 ENCOUNTER — Telehealth: Payer: Self-pay | Admitting: Medical Oncology

## 2016-03-11 VITALS — BP 156/87 | HR 65 | Temp 98.2°F | Resp 18 | Ht 68.0 in | Wt 160.2 lb

## 2016-03-11 DIAGNOSIS — C61 Malignant neoplasm of prostate: Secondary | ICD-10-CM

## 2016-03-11 DIAGNOSIS — I10 Essential (primary) hypertension: Secondary | ICD-10-CM | POA: Diagnosis not present

## 2016-03-11 DIAGNOSIS — Z8249 Family history of ischemic heart disease and other diseases of the circulatory system: Secondary | ICD-10-CM | POA: Diagnosis not present

## 2016-03-11 DIAGNOSIS — Z8042 Family history of malignant neoplasm of prostate: Secondary | ICD-10-CM | POA: Diagnosis not present

## 2016-03-11 DIAGNOSIS — Z87891 Personal history of nicotine dependence: Secondary | ICD-10-CM | POA: Diagnosis not present

## 2016-03-11 DIAGNOSIS — E119 Type 2 diabetes mellitus without complications: Secondary | ICD-10-CM | POA: Diagnosis not present

## 2016-03-11 DIAGNOSIS — R32 Unspecified urinary incontinence: Secondary | ICD-10-CM | POA: Diagnosis not present

## 2016-03-11 DIAGNOSIS — Z7902 Long term (current) use of antithrombotics/antiplatelets: Secondary | ICD-10-CM | POA: Diagnosis not present

## 2016-03-11 DIAGNOSIS — Z79899 Other long term (current) drug therapy: Secondary | ICD-10-CM | POA: Diagnosis not present

## 2016-03-11 DIAGNOSIS — E785 Hyperlipidemia, unspecified: Secondary | ICD-10-CM | POA: Diagnosis not present

## 2016-03-11 DIAGNOSIS — Z794 Long term (current) use of insulin: Secondary | ICD-10-CM | POA: Diagnosis not present

## 2016-03-11 NOTE — Progress Notes (Signed)
See progress note under physician encounter. 

## 2016-03-11 NOTE — Telephone Encounter (Signed)
Patient has not shown for 1330 appointment. Phoned home and cell to inquire. No answer at either. Left message requesting a return call.

## 2016-03-11 NOTE — Progress Notes (Signed)
I called Billy Torres to introduce myself as the navigator for the prostate patients. I was not able to meet him today during his consult with Dr. [name]. Per Dr. Johny Shears note he is considering brachytherapy due to transportation issues. His daughter in law states that he is not available and does not have a phone but uses her phone number for contact. She helps him keep tract of his appointments. I will follow if he chooses brachytherapy.

## 2016-03-11 NOTE — Progress Notes (Signed)
Radiation Oncology         [phone] ________________________________  Initial outpatient Consultation  Name: Billy Torres MRN: [id number]  Date: 03/11/2016  DOB: Aug 20, 1955  [id number], Vaughan Basta, NP  Raynelle Bring, MD   REFERRING PHYSICIAN: Raynelle Bring, MD  DIAGNOSIS: 61 y.o. gentleman with intermediate Stage T1c adenocarcinoma of the prostate with Gleason's score of 3+4 and PSA of 10.4    ICD-9-CM ICD-10-CM   1. Malignant neoplasm of prostate ([place]) 185 C61     HISTORY OF PRESENT ILLNESS: Billy Torres is a 61 y.o. male seen at the request of Dr. [name] for a new diagnosis of prostate cancer. He was noted to have an elevated PSA of 10.4 by his primary care physician, Sharon Seller, NP.  Accordingly, he was referred for evaluation in urology by Dr. Gaynelle Arabian.  The patient proceeded to transrectal ultrasound with 12 biopsies of the prostate on 12/12/2015.  The prostate volume measured 35.7 cc.  Out of 12 core biopsies, 10 were positive.  The maximum Gleason score was 3+4, and this was seen in the left and right mid. On 01/09/2016, digital rectal examination was performed at that time revealing no nodules. He underwent a metastatic work up with CT abdomen/pelvis and Bone Scan on 01/04/16, neither of which revealed metastatic disease.  The patient reviewed the biopsy results with his urologist and he has kindly been referred today for discussion of potential radiation treatment options.    PREVIOUS RADIATION THERAPY: No  PAST MEDICAL HISTORY:  Past Medical History:  Diagnosis Date  . Diabetes mellitus without complication ([place])   . Hyperlipidemia   . Hypertension   . Prostate cancer ([place])       PAST SURGICAL HISTORY: Past Surgical History:  Procedure Laterality Date  . NECK SURGERY    . PROSTATE BIOPSY      FAMILY HISTORY:  Family History  Problem Relation Age of Onset  . Hypertension Mother   . Hypertension Father   . Cancer Brother     prostate     SOCIAL HISTORY:  Social History   Social History  . Marital status: Divorced    Spouse name: N/A  . Number of children: N/A  . Years of education: N/A   Occupational History  . Not on file.   Social History Main Topics  . Smoking status: Former Smoker    Packs/day: 0.25    Years: 45.00    Types: Cigarettes    Quit date: 01/08/2016  . Smokeless tobacco: Never Used  . Alcohol use No  . Drug use: No  . Sexual activity: Yes   Other Topics Concern  . Not on file   Social History Narrative  . No narrative on file    ALLERGIES: Patient has no known allergies.  MEDICATIONS:  Current Outpatient Prescriptions  Medication Sig Dispense Refill  . acetaminophen (TYLENOL) 325 MG tablet Take 2 tablets (650 mg total) by mouth every 6 (six) hours as needed for mild pain.    Marland Kitchen atorvastatin (LIPITOR) 80 MG tablet Take 1 tablet (80 mg total) by mouth daily. 30 tablet 3  . clopidogrel (PLAVIX) 75 MG tablet Take 1 tablet (75 mg total) by mouth daily with breakfast. 30 tablet 3  . donepezil (ARICEPT) 10 MG tablet Take 10 mg by mouth at bedtime.    Marland Kitchen glucose blood (ACCU-CHEK AVIVA) test strip Use as instructed to check blood sugar three times daily. 100 each 12  . glucose blood (TRUE METRIX BLOOD GLUCOSE TEST) test  strip Use as instructed to check blood sugar three times daily 100 each 12  . insulin aspart (NOVOLOG FLEXPEN) 100 UNIT/ML FlexPen Inject as per sliding scale. If 1-149= 0, 150-600= 5 units before meals 15 mL 11  . lisinopril (PRINIVIL,ZESTRIL) 10 MG tablet Take 1 tablet (10 mg total) by mouth daily. Start after 05/21/2015. 90 tablet 1  . metFORMIN (GLUCOPHAGE-XR) 750 MG 24 hr tablet Take 750 mg by mouth daily with breakfast.    . pregabalin (LYRICA) 100 MG capsule Take 100 mg by mouth 2 (two) times daily.    Marland Kitchen senna-docusate (SENOKOT-S) 8.6-50 MG tablet Take 1 tablet by mouth at bedtime as needed for mild constipation. 30 tablet 0   No current facility-administered medications  for this encounter.     REVIEW OF SYSTEMS:  On review of systems, the patient reports that he is doing well overall. He denies any chest pain, shortness of breath, cough, fevers, chills, night sweats, unintended weight changes. He denies any bowel disturbances, and denies abdominal pain, nausea or vomiting. He reports urinary incontinence since stroke, requiring 1 pad per day. He reports nocturia x 1.  He denies dysuria or hematuria. He denies heart attack. He is taking plavix. He denies any new musculoskeletal or joint aches or pains. His IPSS is 12 indicating moderate urinary symptoms. His SHIM questionnaire indicates mild erectile dysfunction. A complete review of systems is obtained and is otherwise negative.    PHYSICAL EXAM:  Wt Readings from Last 3 Encounters:  03/11/16 160 lb 3.2 oz (72.7 kg)  08/14/15 142 lb (64.4 kg)  06/13/15 146 lb (66.2 kg)   Temp Readings from Last 3 Encounters:  03/11/16 98.2 F (36.8 C) (Oral)  08/14/15 98.1 F (36.7 C) (Oral)  07/20/15 98.3 F (36.8 C) (Oral)   BP Readings from Last 3 Encounters:  03/11/16 (!) 156/87  08/14/15 125/81  08/02/15 (!) 128/96   Pulse Readings from Last 3 Encounters:  03/11/16 65  08/14/15 85  08/02/15 69   Pain Assessment Pain Score: 0-No pain/10  In general this is a well appearing Hispanic in no acute distress. He is alert and oriented x4 and appropriate throughout the examination. HEENT reveals that the patient is normocephalic, atraumatic. EOMs are intact. PERRLA. Skin is intact without any evidence of gross lesions. Cardiovascular exam reveals a regular rate and rhythm, no clicks rubs or murmurs are auscultated. Chest is clear to auscultation bilaterally. Lymphatic assessment is performed and does not reveal any adenopathy in the cervical, supraclavicular, axillary, or inguinal chains. Abdomen has active bowel sounds in all quadrants and is intact. The abdomen is soft, non tender, non distended. Lower extremities are  negative for pretibial pitting edema, deep calf tenderness, cyanosis or clubbing.   KPS = 100  100 - Normal; no complaints; no evidence of disease. 90   - Able to carry on normal activity; minor signs or symptoms of disease. 80   - Normal activity with effort; some signs or symptoms of disease. 77   - Cares for self; unable to carry on normal activity or to do active work. 60   - Requires occasional assistance, but is able to care for most of his personal needs. 50   - Requires considerable assistance and frequent medical care. 26   - Disabled; requires special care and assistance. 33   - Severely disabled; hospital admission is indicated although death not imminent. 20   - Very sick; hospital admission necessary; active supportive treatment necessary. 10   -  Moribund; fatal processes progressing rapidly. 0     - Dead  Karnofsky DA, Abelmann [place], Craver LS and Burchenal [facility] [phone]) The use of the nitrogen mustards in the palliative treatment of carcinoma: with particular reference to bronchogenic carcinoma Cancer 1 634-56  LABORATORY DATA:  Lab Results  Component Value Date   WBC 7.3 08/14/2015   HGB 13.8 08/14/2015   HCT 43.4 08/14/2015   MCV 84.1 08/14/2015   PLT 242 08/14/2015   Lab Results  Component Value Date   NA 142 08/14/2015   K 4.5 08/14/2015   CL 105 08/14/2015   CO2 24 08/14/2015   Lab Results  Component Value Date   ALT 22 08/14/2015   AST 13 08/14/2015   ALKPHOS 54 08/14/2015   BILITOT 0.7 08/14/2015     RADIOGRAPHY: Dg Hand Complete Left  Result Date: 02/22/2016 CLINICAL DATA:  Left hand pain for several months, no known injury, initial encounter EXAM: LEFT HAND - COMPLETE 3+ VIEW COMPARISON:  None. FINDINGS: Mild osteopenia is noted. Vascular calcifications are seen. Diffuse degenerative changes are noted within the interphalangeal joints. No acute fracture or dislocation is noted. No gross soft tissue abnormality is noted. IMPRESSION: Mild degenerative  changes without acute abnormality. Electronically Signed   By: Inez Catalina M.D.   On: 02/22/2016 09:56      IMPRESSION/PLAN: 1. 61 y.o. gentleman with intermediate Stage T1c adenocarcinoma of the prostate with Gleason's score of 3+4 and PSA of 10.4. Today Dr. [name] reviewed the findings and workup thus far.  We discussed the natural history of prostate cancer.  We reviewed the the implications of T-stage, Gleason's Score, and PSA on decision-making and outcomes in prostate cancer.  We discussed radiation treatment in the management of prostate cancer with regard to the logistics and delivery of external beam radiation treatment as well as the logistics and delivery of prostate brachytherapy.  The patient is not an ideal candidate for prostatectomy because of cardiac risk. The patient would like to proceed with prostate brachytherapy.  We will share our findings with Dr.Tannenbaum and Dr. [name] and move forward with scheduling the procedure in the near future.    2. Urinary incontinence with questionable neurogenic bladder. Dr. [name] is referring him for urodynamic evaluation to rule out and/or create a treatment regimen for what he is concerned for neurogenic bladder.  3. Transportation concerns. He is interested in prostate brachytherapy due to transportation concerns. He lives in [facility] and does not drive. We will reach out to his cardiologist and primary care physician to see if surgery is recommended.  The above documentation reflects our direct findings during this shared patient visit.    Carola Rhine, PAC  &   Tyler Pita, MD [place] Director and Director of Stereotactic Radiosurgery Direct Dial: [phone]  Fax: [phone] [email or web address]  Skype  LinkedIn      This document serves as a record of services personally performed by Tyler Pita, MD and Shona Simpson, PA-C. It was created on their behalf by Arlyce Harman,  a trained medical scribe. The creation of this record is based on the scribe's personal observations and the provider's statements to them. This document has been checked and approved by the attending provider.

## 2016-03-11 NOTE — Progress Notes (Signed)
Patient traveled here today via [facility].

## 2016-03-12 DIAGNOSIS — H903 Sensorineural hearing loss, bilateral: Secondary | ICD-10-CM | POA: Insufficient documentation

## 2016-03-12 DIAGNOSIS — H6123 Impacted cerumen, bilateral: Secondary | ICD-10-CM | POA: Insufficient documentation

## 2016-03-14 ENCOUNTER — Telehealth: Payer: Self-pay | Admitting: *Deleted

## 2016-03-14 NOTE — Telephone Encounter (Signed)
CALLED PATIENT TO INFORM OF PRE-SEED APPT. FOR 03-29-16 @ 10 AM, SPOKE WITH PATIENT AND HE IS AWARE OF THESE APPTS.

## 2016-03-15 ENCOUNTER — Other Ambulatory Visit: Payer: Self-pay | Admitting: Urology

## 2016-03-18 ENCOUNTER — Telehealth: Payer: Self-pay | Admitting: *Deleted

## 2016-03-18 NOTE — Telephone Encounter (Signed)
Called patient to inform of implant date, lvm for a return call 

## 2016-03-28 ENCOUNTER — Telehealth: Payer: Self-pay | Admitting: *Deleted

## 2016-03-28 NOTE — Telephone Encounter (Signed)
CALLED PATIENT TO REMIND OF PRE-SEED APPTS. FOR 03-29-16, LVM FOR A RETURN CALL

## 2016-03-29 ENCOUNTER — Ambulatory Visit
Admission: RE | Admit: 2016-03-29 | Discharge: 2016-03-29 | Disposition: A | Discharge status: Home / Self Care | Payer: Medicare (Managed Care) | Source: Ambulatory Visit | Attending: Radiation Oncology | Admitting: Radiation Oncology

## 2016-03-29 ENCOUNTER — Ambulatory Visit (HOSPITAL_BASED_OUTPATIENT_CLINIC_OR_DEPARTMENT_OTHER)
Admission: RE | Admit: 2016-03-29 | Discharge: 2016-03-29 | Disposition: A | Discharge status: Home / Self Care | Payer: Medicaid Other | Source: Ambulatory Visit | Attending: Urology | Admitting: Urology

## 2016-03-29 ENCOUNTER — Encounter (HOSPITAL_BASED_OUTPATIENT_CLINIC_OR_DEPARTMENT_OTHER)
Admission: RE | Admit: 2016-03-29 | Discharge: 2016-03-29 | Disposition: A | Discharge status: Home / Self Care | Payer: Medicaid Other | Source: Ambulatory Visit | Attending: Urology | Admitting: Urology

## 2016-03-29 ENCOUNTER — Other Ambulatory Visit: Payer: Self-pay

## 2016-03-29 ENCOUNTER — Other Ambulatory Visit: Payer: Self-pay | Admitting: Urology

## 2016-03-29 DIAGNOSIS — C61 Malignant neoplasm of prostate: Secondary | ICD-10-CM | POA: Insufficient documentation

## 2016-03-29 DIAGNOSIS — Z01810 Encounter for preprocedural cardiovascular examination: Secondary | ICD-10-CM | POA: Insufficient documentation

## 2016-03-29 NOTE — Progress Notes (Signed)
  Radiation Oncology         [phone] ________________________________  Name: Billy Torres MRN: [id number]  Date: 03/29/2016  DOB: [birth year]  SIMULATION AND TREATMENT PLANNING NOTE PUBIC ARCH STUDY  [id number], LINDA, NP  Raynelle Bring, MD  DIAGNOSIS: 61 y.o. gentleman with intermediate Stage T1c adenocarcinoma of the prostate with Gleason's score of 3+4 and PSA of 10.4     ICD-9-CM ICD-10-CM   1. Malignant neoplasm of prostate ([place]) [place]:  The patient presented today for evaluation for possible prostate seed implant. He was brought to the radiation planning suite and placed supine on the CT couch. A 3-dimensional image study set was obtained in upload to the planning computer. There, on each axial slice, I contoured the prostate gland. Then, using three-dimensional radiation planning tools I reconstructed the prostate in view of the structures from the transperineal needle pathway to assess for possible pubic arch interference. In doing so, I did not appreciate any pubic arch interference. Also, the patient's prostate volume was estimated based on the drawn structure. The volume was 51 cc.  Given the pubic arch appearance and prostate volume, patient remains a good candidate to proceed with prostate seed implant. Today, he freely provided informed written consent to proceed.    PLAN: The patient will undergo prostate seed implant.   ________________________________  Sheral Apley. [name], M.D.

## 2016-04-21 NOTE — Addendum Note (Signed)
Encounter addended by: Tyler Pita, MD on: 04/21/2016  2:56 PM<BR>    Actions taken: Delete clinical note

## 2016-05-20 ENCOUNTER — Encounter (HOSPITAL_BASED_OUTPATIENT_CLINIC_OR_DEPARTMENT_OTHER): Payer: Self-pay | Admitting: *Deleted

## 2016-05-21 ENCOUNTER — Encounter (HOSPITAL_BASED_OUTPATIENT_CLINIC_OR_DEPARTMENT_OTHER): Payer: Self-pay | Admitting: *Deleted

## 2016-05-22 ENCOUNTER — Telehealth: Payer: Self-pay | Admitting: *Deleted

## 2016-05-22 NOTE — Telephone Encounter (Signed)
CALLED [facility] TO ARRANGE TRANSPO. FOR THIS PT. TO HAVE BLOOD WORK ON 05-23-16 FOR HIS IMPLANT ON 05-30-16, LVM FOR A RETURN CALL

## 2016-05-27 ENCOUNTER — Encounter (HOSPITAL_BASED_OUTPATIENT_CLINIC_OR_DEPARTMENT_OTHER): Payer: Self-pay | Admitting: *Deleted

## 2016-05-27 NOTE — Progress Notes (Addendum)
RECEIVED CURRENT MEDICATION LIST FROM [facility], UPDATED IN EPIC.  CALLED PT'S DAUGHTER-N- LAW, AMANDS, SHE STATED THAT PT LEFT HIS CELL PHONE AT HER HOME AND PT'S SON IS NOT AT HOME AT THIS TIME.  BUT STATED THAT PT LIVES W/ HIS OTHER SON , NEIL.  SHE STATED THAT WHEN HER HUSBAND GETS HOME WILL RELAY INFORMATION NEEDED ABOUT PT AND TRANSPORTATION ARRANGEMENT'S.  LET HER KNOW THAT I AM HERE UNTIL 5pm AND IN OFFICE AT 0830 TOMORROW.

## 2016-05-27 NOTE — Progress Notes (Signed)
PT HAD BEEN ARRANGED TRANSPORTATION FOR LAB WORK TO DONE Thursday 05-23-2016.  NO SHOW.  CALLED AND LM FOR [facility], [facility] FOR DR MANNING,  WHOM ARRANGED TRANSPORTATION W/ [facility] THAT PT NO SHOW.  HAVE SPOKEN TO SHIRLEY VIA PHONE TODAY STATED SHE HAD BEEN UNABLE TO CONTACT PT BUT WAS ABLE TO SPEAK W/ EMERGENCY CONTACT , DAUGHTER-N-LAW: AMANDA,  WHOM WAS GOING TO HAVE HER HUSBAND JUSTIN PT'S SON TO CONTACT HER.  I ALSO, DID GET CONTACT INFORMATION FROM [facility] , NAME AND PHONE NUMBER.  THEY HAD REQUESTED LAB WORK BE DONE THERE AT THEIR FACILITY.    CALLED AND SPOKE W/ SONYA, SCHEDULER FOR TRANSPORTATION FOR [facility].  THIS IS PT'S PCP AND INSURANCE, WHICH IS ALL INCLUSIVE CARE.  NUMBER GIVEN TO FAX LAB ORDER REQUISITION'S TO #[phone], ATTENTION TO DR Dorian Pod (PCP).  ASKED ABOUT MEDICATION LIST SONYA IS FAXING THAT TO [place].  ALSO, WAS GIVEN PT'S PHONE NUMBER [phone] AND DAUGHTER-N-LAW PHONE NUMBER # [phone], WHOM [place].

## 2016-05-28 ENCOUNTER — Encounter (HOSPITAL_BASED_OUTPATIENT_CLINIC_OR_DEPARTMENT_OTHER): Payer: Self-pay | Admitting: *Deleted

## 2016-05-28 NOTE — Progress Notes (Addendum)
SPOKE W/ PT'S SON, NEIL, GIRLFRIEND ([place] BROWN) WHOM PT LIVES WITH.  NPO AFTER MN.  ARRIVE AT 0800.  CURRENT EKG AND CXR IN CHART AND EPIC.  PT HAD LAB WORK DONE AT [facility] AND THEY ARE TO FAX RESULTS.  PT WILL TAKE AM MEDS DOS W/ SIPS OF WATER WITH EXCEPTION NO METFORMIN.  PT WILL DO FLEET ENEMA AM DOS.  PT SON , NEIL , WILL BE WITH HIM DOS.  VERBALIZED UNDERSTANDING IF PT HAS NOT ALREADY STOPPED PLAVIX , HE IS STOP UNTIL AFTER SURGERY.

## 2016-05-29 ENCOUNTER — Telehealth: Payer: Self-pay | Admitting: *Deleted

## 2016-05-29 NOTE — H&P (Signed)
--------------------------------------------------------------------------------   Billy Torres  MRN: [id number]  PRIMARY CARE:    DOB: December 18, 1955, 61 year old Male  REFERRING:    PROVIDER:  Carolan Clines, M.D.    LOCATION:  [facility] [phone]   --------------------------------------------------------------------------------   CC: I have prostate cancer.  HPI: Billy Torres is a 61 year-old male patient who is here evaluation for treatment of prostate cancer.  His prostate cancer was diagnosed 12/12/2015. He does have the pathology report from his biopsy. His cancer was diagnosed by [facility]. His PSA at his time of diagnosis was 10.4. His most recent PSA is 10.4.   He has not undergone surgery for treatment. He has not undergone External Beam Radiation Therapy for treatment. He has not undergone Hormonal Therapy for treatment.   He does have urinary incontinence. He does have problems with erectile dysfunction. He is not having pain in new locations.   Pt/son return for cancer consultation/ refuse to consider Radiation Therapy Rx.      AUA Symptom Score: 50% of the time he has the sensation of not emptying his bladder completely when finished urinating. Less than 50% of the time he has to urinate again fewer than two hours after he has finished urinating. Less than 20% of the time he has to start and stop again several times when he urinates. Less than 20% of the time he finds it difficult to postpone urination. He never has a weak urinary stream. He never has to push or strain to begin urination. He has to get up to urinate 2 times from the time he goes to bed until the time he gets up in the morning.   Calculated AUA Symptom Score: 9    QOL Score: He would feel mostly satisfied if he had to live with his urinary condition the way it is now for the rest of his life.   Calculated QOL Symptom Score: 2    ALLERGIES: No Known Drug Allergies     MEDICATIONS: Levaquin 500 mg tablet 1 tablet the day before procedure, 1 tablet day of procedure, 1 tablet day after procedure  Lisinopril 10 mg tablet  Metformin Hcl 500 mg tablet  Valium 10 mg tablet 1 tablet 1 hour prior to procedure  Atorvastatin Calcium 80 mg tablet  Centrum  Clopidogrel 75 mg tablet  Docusate Sodium 100 mg capsule  Memantine Hcl 5 mg tablet  Metoclopramide Hcl 5 mg tablet  Tylenol Arthritis 650 mg tablet, extended release     GU PSH: Locm 300-399Mg /Ml Iodine,1Ml - 01/04/2016 Prostate Needle Biopsy - 12/12/2015    NON-GU PSH: Foot/toes Surgery Procedure, Right - 2005 Neck Surgery - 2007 Shoulder Surgery (Unspecified), Right - 2008 Surgical Pathology, Gross And Microscopic Examination For Prostate Needle - 12/12/2015    GU PMH: Elevated PSA - 12/12/2015, - 11/07/2015 BPH w/LUTS - 11/07/2015 Family Hx of Prostate Cancer - 11/07/2015 Urinary Frequency - 11/07/2015 Enuresis, Nocturnal Urge incontinence    NON-GU PMH: Cerebellar stroke syndrome Diabetes Type 2 Hypertension    FAMILY HISTORY: Prostate Cancer - Brother   SOCIAL HISTORY: Marital Status: Divorced Current Smoking Status: Patient smokes. Smokes 2 packs per day.  Has never drank.  Drinks 2 caffeinated drinks per day. Patient's occupation is/was Retired.     Notes: accompanied by sister, who has just returned from [place]( married and lives in [place]).   REVIEW OF SYSTEMS:    GU Review Male:   Patient reports frequent urination and trouble  starting your stream. Patient denies hard to postpone urination, erection problems, burning/ pain with urination, stream starts and stops, get up at night to urinate, penile pain, have to strain to urinate , and leakage of urine.  Gastrointestinal (Upper):   Patient denies nausea, vomiting, and indigestion/ heartburn.  Gastrointestinal (Lower):   Patient denies diarrhea and constipation.  Constitutional:   Patient denies fever, night sweats, weight loss,  and fatigue.  Skin:   Patient denies skin rash/ lesion and itching.  Eyes:   Patient denies blurred vision and double vision.  Ears/ Nose/ Throat:   Patient denies sore throat and sinus problems.  Hematologic/Lymphatic:   Patient denies swollen glands and easy bruising.  Cardiovascular:   Patient denies leg swelling and chest pains.  Respiratory:   Patient denies cough and shortness of breath.  Endocrine:   Patient denies excessive thirst.  Musculoskeletal:   Patient denies back pain and joint pain.  Neurological:   Patient denies headaches and dizziness.  Psychologic:   Patient denies depression and anxiety.   VITAL SIGNS:      01/09/2016 04:20 PM  Weight 155 lb / 70.31 kg  Height 67 in / 170.18 cm  BP 138/79 mmHg  Pulse 72 /min  BMI 24.3 kg/m   GU PHYSICAL EXAMINATION:    Anus and Perineum: No hemorrhoids. No anal stenosis. No rectal fissure, no anal fissure. No edema, no dimple, no perineal tenderness, no anal tenderness.  Scrotum: No lesions. No edema. No cysts. No warts.  Epididymides: Right: no spermatocele, no masses, no cysts, no tenderness, no induration, no enlargement. Left: no spermatocele, no masses, no cysts, no tenderness, no induration, no enlargement.  Testes: No tenderness, no swelling, no enlargement left testes. No tenderness, no swelling, no enlargement right testes. Normal location left testes. Normal location right testes. No mass, no cyst, no varicocele, no hydrocele left testes. No mass, no cyst, no varicocele, no hydrocele right testes.  Urethral Meatus: Normal size. No lesion, no wart, no discharge, no polyp. Normal location.  Penis: Circumcised, no warts, no cracks. No dorsal Peyronie's plaques, no left corporal Peyronie's plaques, no right corporal Peyronie's plaques, no scarring, no warts. No balanitis, no meatal stenosis.  Prostate: 40 gram or 2+ size. Left lobe normal consistency, right lobe normal consistency. Symmetrical lobes. No prostate nodule. Left  lobe no tenderness, right lobe no tenderness.  Seminal Vesicles: Nonpalpable.  Sphincter Tone: Normal sphincter. No rectal tenderness. No rectal mass.    MULTI-SYSTEM PHYSICAL EXAMINATION:    Constitutional: Well-nourished. No physical deformities. Normally developed. Good grooming.  Neck: Neck symmetrical, not swollen. Normal tracheal position.  Respiratory: No labored breathing, no use of accessory muscles.   Cardiovascular: Normal temperature, normal extremity pulses, no swelling, no varicosities.  Lymphatic: No enlargement of neck, axillae, groin.  Skin: No paleness, no jaundice, no cyanosis. No lesion, no ulcer, no rash.  Neurologic / Psychiatric: Oriented to time, oriented to place, oriented to person. No depression, no anxiety, no agitation.  Gastrointestinal: No mass, no tenderness, no rigidity, non obese abdomen.  Eyes: Normal conjunctivae. Normal eyelids.  Ears, Nose, Mouth, and Throat: Teeth poor dentition. Edentulous. Left ear no scars, no lesions, no masses. Right ear no scars, no lesions, no masses. Nose no scars, no lesions, no masses. Normal hearing. Normal lips.   Musculoskeletal: Normal gait and station of head and neck.     PAST DATA REVIEWED:  Source Of History:  Patient  Lab Test Review:   Path Report  Records Review:  Previous Patient Records  X-Ray Review: C.T. Abdomen/Pelvis: Reviewed Films. Reviewed Report. Discussed With Patient.  Bone Scan: Reviewed Films. Reviewed Report. Discussed With Patient.     11/07/15  PSA  Total PSA 10.40   Free PSA 2.05   % Free PSA 20     11/07/15  Hormones  Testosterone, Total 332.2 pg/dL    01/09/16  Urinalysis  Urine Appearance Clear   Urine Color Yellow   Urine Glucose Neg   Urine Bilirubin Neg   Urine Ketones Neg   Urine Specific Gravity 1.025   Urine Blood Neg   Urine pH 6.0   Urine Protein Neg   Urine Urobilinogen 0.2   Urine Nitrites Neg   Urine Leukocyte Esterase Neg    PROCEDURES:           Urinalysis Dipstick Dipstick Cont'd  Color: Yellow Bilirubin: Neg  Appearance: Clear Ketones: Neg  Specific Gravity: 1.025 Blood: Neg  pH: 6.0 Protein: Neg  Glucose: Neg Urobilinogen: 0.2    Nitrites: Neg    Leukocyte Esterase: Neg    ASSESSMENT:      ICD-10 Details  1 GU:   Prostate Cancer - C61   2   BPH w/LUTS - N40.1   3   Elevated PSA - R97.20   4   Family Hx of Prostate Cancer - Z80.42   7   Urge incontinence - N39.41   8   Enuresis, Nocturnal - N39.44   5 NON-GU:   Cerebellar stroke syndrome - G46.4   6   Diabetes Type 2 - E11.9           Notes:   Obadiah a 61 year old type II diabetic, referred for evaluation of elevated PSA of 8.4, with repeat PSA of 8.7, and 10.4 ( 2.05% free fraction). He is sen today with his son for PCa consultation.  The patient's brother died with prostate cancer( died secondary to lung cancer ). The patient himself has mild bladder outlet symptoms, including incomplete emptying( 3/5), urinary frequency, intermittency, urgency, and nocturia ( 2/5), with IPSS=9 ( normal). His quality of life, however, is 4/6. Rectal exam showed normal sphincter tone-despite recent CVA. He complains of new onset urge incontinence.   The patient himself suffered a CVA, in April, 2017, with resultant left upper extremity and left lower extremity weakness. He c/o urge and urge incontinence also, thought 2ndary CVA.  PBx shows : 8 + bx G 3+3=6 CaP 10%-80%  2 + bx G 3+4=7 CP 40%-90% ( R and L mid-medial bxs)  ( see diagram below: bx's + at both apices and bases: Note pt is not sexually active. no known erectile function).  Size: 36.6cc gland  IPSS=9  Testosterone 332  Creatinine: 0.8, gfr= 104 ( normal)  Partin Table for T1c, PSA > 10: organ-confined 52%  Extra-capsular extention: 34%  SV disease: 5%  Lymph node disease ( despite negative CT scan) 5%  I have outlined the diagnosis of prostate cancer with the patient and his son, and the Gleason grading system. I believe he  has two different prostate cancer cell types, but both are "moderate grade" in their aggressiveness. His disease reflects " multi-focal disease: multiple cells becoming cancerous at the same time-rather than one cell become cancer and spreading to other sites.  The worst prognostic sign for Mr Schara is the volume of tumor within the gland. We have discussed treatment options of watchful waiting; however, with a PSA already > 10, and at his age of only  60, I believe he will develop metastatic disease sooner rather than later; and I would offer him more aggressive treatment alternatives. We have discussed radical prostatectomy, and he is agreeable to having a 2nd opinion regarding surgical extirpation of his prostate; although , with his Partin statistics, I am not sure it would be curative. We have discussed Radiation therapy, but both he and his son do not want to consider this Rx ( b/c they know someone who has had a complication/bad result (?) from Rad Rx in [place]). We have also discussed Cryotherapy as primary therapy, and this may be more attractive for him, in view of his CVA last April ( requiring less anesthesia, and would be outpatient). If he chooses Cryotherapy, we may consider peri-operative hormone Rx. ( 6 months).  He will eventually need his OAB/urge incontinence addressed with urodynamics/anticholinergics.      PLAN:           Schedule Return Visit/Planned Activity: Next Available Appointment - Consult             Note: After seeing Dr. [name]  Return Visit/Planned Activity: Next Available Appointment - Consult             Note: 2nd OP with Dr. [name]          Document Letter(s):  Created for Patient: Clinical Summary          Drawings Evelena Peat Springsteen:           --------------------------------------------------------------------------------

## 2016-05-29 NOTE — Telephone Encounter (Signed)
CALLED PATIENT TO REMIND OF IMPLANT FOR 05-30-16, LVM FOR A RETURN CALL

## 2016-05-30 ENCOUNTER — Encounter (HOSPITAL_BASED_OUTPATIENT_CLINIC_OR_DEPARTMENT_OTHER)
Admission: RE | Disposition: A | Discharge status: Home / Self Care | Payer: Self-pay | Source: Ambulatory Visit | Attending: Urology

## 2016-05-30 ENCOUNTER — Ambulatory Visit (HOSPITAL_BASED_OUTPATIENT_CLINIC_OR_DEPARTMENT_OTHER): Payer: Medicare (Managed Care) | Admitting: Anesthesiology

## 2016-05-30 ENCOUNTER — Encounter (HOSPITAL_BASED_OUTPATIENT_CLINIC_OR_DEPARTMENT_OTHER): Payer: Self-pay | Admitting: Anesthesiology

## 2016-05-30 ENCOUNTER — Encounter: Payer: Self-pay | Admitting: Radiation Oncology

## 2016-05-30 ENCOUNTER — Ambulatory Visit (HOSPITAL_COMMUNITY): Payer: Medicare (Managed Care)

## 2016-05-30 ENCOUNTER — Ambulatory Visit (HOSPITAL_BASED_OUTPATIENT_CLINIC_OR_DEPARTMENT_OTHER)
Admission: RE | Admit: 2016-05-30 | Discharge: 2016-05-30 | Disposition: A | Discharge status: Home / Self Care | Payer: Medicare (Managed Care) | Source: Ambulatory Visit | Attending: Urology | Admitting: Urology

## 2016-05-30 DIAGNOSIS — I10 Essential (primary) hypertension: Secondary | ICD-10-CM | POA: Diagnosis not present

## 2016-05-30 DIAGNOSIS — N401 Enlarged prostate with lower urinary tract symptoms: Secondary | ICD-10-CM | POA: Diagnosis not present

## 2016-05-30 DIAGNOSIS — N3941 Urge incontinence: Secondary | ICD-10-CM | POA: Insufficient documentation

## 2016-05-30 DIAGNOSIS — Z79899 Other long term (current) drug therapy: Secondary | ICD-10-CM | POA: Diagnosis not present

## 2016-05-30 DIAGNOSIS — C61 Malignant neoplasm of prostate: Secondary | ICD-10-CM

## 2016-05-30 DIAGNOSIS — I69354 Hemiplegia and hemiparesis following cerebral infarction affecting left non-dominant side: Secondary | ICD-10-CM | POA: Diagnosis not present

## 2016-05-30 DIAGNOSIS — Z7902 Long term (current) use of antithrombotics/antiplatelets: Secondary | ICD-10-CM | POA: Diagnosis not present

## 2016-05-30 DIAGNOSIS — Z7984 Long term (current) use of oral hypoglycemic drugs: Secondary | ICD-10-CM | POA: Diagnosis not present

## 2016-05-30 DIAGNOSIS — E119 Type 2 diabetes mellitus without complications: Secondary | ICD-10-CM | POA: Diagnosis not present

## 2016-05-30 DIAGNOSIS — F17210 Nicotine dependence, cigarettes, uncomplicated: Secondary | ICD-10-CM | POA: Insufficient documentation

## 2016-05-30 DIAGNOSIS — Z8042 Family history of malignant neoplasm of prostate: Secondary | ICD-10-CM | POA: Insufficient documentation

## 2016-05-30 DIAGNOSIS — E785 Hyperlipidemia, unspecified: Secondary | ICD-10-CM | POA: Insufficient documentation

## 2016-05-30 HISTORY — DX: Unspecified osteoarthritis, unspecified site: M19.90

## 2016-05-30 HISTORY — DX: Long term (current) use of insulin: Z79.4

## 2016-05-30 HISTORY — PX: RADIOACTIVE SEED IMPLANT: SHX5150

## 2016-05-30 HISTORY — DX: Frequency of micturition: R35.0

## 2016-05-30 HISTORY — DX: Other speech disturbances: R47.89

## 2016-05-30 HISTORY — DX: Presence of dental prosthetic device (complete) (partial): Z97.2

## 2016-05-30 HISTORY — DX: Long term (current) use of anticoagulants: Z79.01

## 2016-05-30 HISTORY — DX: Sensorineural hearing loss, bilateral: H90.3

## 2016-05-30 HISTORY — DX: Urge incontinence: N39.41

## 2016-05-30 HISTORY — DX: Spinal stenosis, cervical region: M48.02

## 2016-05-30 HISTORY — DX: Personal history of transient ischemic attack (TIA), and cerebral infarction without residual deficits: Z86.73

## 2016-05-30 HISTORY — DX: Long term (current) use of insulin: E11.9

## 2016-05-30 HISTORY — DX: Unspecified abnormalities of gait and mobility: R26.9

## 2016-05-30 HISTORY — DX: Occlusion and stenosis of right carotid artery: I65.21

## 2016-05-30 HISTORY — DX: Unspecified symptoms and signs involving cognitive functions following cerebral infarction: I69.319

## 2016-05-30 HISTORY — DX: Hemiplegia, unspecified affecting left nondominant side: G81.94

## 2016-05-30 HISTORY — DX: Other constipation: K59.09

## 2016-05-30 HISTORY — DX: Other sequelae of cerebral infarction: I69.398

## 2016-05-30 HISTORY — DX: History of falling: Z91.81

## 2016-05-30 LAB — GLUCOSE, CAPILLARY: GLUCOSE-CAPILLARY: 158 mg/dL — AB (ref 65–99)

## 2016-05-30 LAB — POCT I-STAT 4, (NA,K, GLUC, HGB,HCT)
Glucose, Bld: 179 mg/dL — ABNORMAL HIGH (ref 65–99)
HCT: 40 % (ref 39.0–52.0)
HEMOGLOBIN: 13.6 g/dL (ref 13.0–17.0)
POTASSIUM: 4 mmol/L (ref 3.5–5.1)
SODIUM: 144 mmol/L (ref 135–145)

## 2016-05-30 SURGERY — INSERTION, RADIATION SOURCE, PROSTATE
Anesthesia: General | Site: Prostate

## 2016-05-30 MED ORDER — BELLADONNA ALKALOIDS-OPIUM 16.2-60 MG RE SUPP
RECTAL | Status: AC
  Filled 2016-05-30: qty 1

## 2016-05-30 MED ORDER — LACTATED RINGERS IV SOLN
INTRAVENOUS | Status: DC
  Administered 2016-05-30 (×2): via INTRAVENOUS
  Filled 2016-05-30: qty 1000

## 2016-05-30 MED ORDER — EPHEDRINE SULFATE-NACL 50-0.9 MG/10ML-% IV SOSY
PREFILLED_SYRINGE | INTRAVENOUS | Status: DC | PRN
  Administered 2016-05-30: 10 mg via INTRAVENOUS
  Administered 2016-05-30: 5 mg via INTRAVENOUS

## 2016-05-30 MED ORDER — SUGAMMADEX SODIUM 200 MG/2ML IV SOLN
INTRAVENOUS | Status: AC
Start: 2016-05-30 — End: 2016-05-30
  Filled 2016-05-30: qty 2

## 2016-05-30 MED ORDER — FENTANYL CITRATE (PF) 100 MCG/2ML IJ SOLN
INTRAMUSCULAR | Status: DC | PRN
  Administered 2016-05-30: 100 ug via INTRAVENOUS

## 2016-05-30 MED ORDER — TRIMETHOPRIM 100 MG PO TABS: 100.0000 mg | ORAL_TABLET | ORAL | 0 refills | Status: DC

## 2016-05-30 MED ORDER — ONDANSETRON HCL 4 MG/2ML IJ SOLN
INTRAMUSCULAR | Status: DC | PRN
  Administered 2016-05-30: 4 mg via INTRAVENOUS

## 2016-05-30 MED ORDER — LIDOCAINE 2% (20 MG/ML) 5 ML SYRINGE
INTRAMUSCULAR | Status: AC
Start: 2016-05-30 — End: 2016-05-30
  Filled 2016-05-30: qty 10

## 2016-05-30 MED ORDER — PROPOFOL 10 MG/ML IV BOLUS
INTRAVENOUS | Status: AC
  Filled 2016-05-30: qty 40

## 2016-05-30 MED ORDER — ONDANSETRON HCL 4 MG/2ML IJ SOLN
INTRAMUSCULAR | Status: AC
Start: 2016-05-30 — End: 2016-05-30
  Filled 2016-05-30: qty 2

## 2016-05-30 MED ORDER — SODIUM CHLORIDE 0.9 % IJ SOLN
INTRAMUSCULAR | Status: DC | PRN
  Administered 2016-05-30: 10 mL

## 2016-05-30 MED ORDER — ONDANSETRON HCL 4 MG/2ML IJ SOLN
4.0000 mg | Freq: Once | INTRAMUSCULAR | Status: DC | PRN
  Filled 2016-05-30: qty 2

## 2016-05-30 MED ORDER — SUGAMMADEX SODIUM 200 MG/2ML IV SOLN
INTRAVENOUS | Status: DC | PRN
  Administered 2016-05-30: 200 mg via INTRAVENOUS

## 2016-05-30 MED ORDER — PROPOFOL 10 MG/ML IV BOLUS
INTRAVENOUS | Status: DC | PRN
  Administered 2016-05-30: 150 mg via INTRAVENOUS
  Administered 2016-05-30: 30 mg via INTRAVENOUS

## 2016-05-30 MED ORDER — FLEET ENEMA 7-19 GM/118ML RE ENEM
1.0000 | ENEMA | Freq: Once | RECTAL | Status: DC
  Filled 2016-05-30: qty 1

## 2016-05-30 MED ORDER — DEXAMETHASONE SODIUM PHOSPHATE 4 MG/ML IJ SOLN
INTRAMUSCULAR | Status: DC | PRN
  Administered 2016-05-30: 10 mg via INTRAVENOUS

## 2016-05-30 MED ORDER — ARTIFICIAL TEARS OPHTHALMIC OINT
TOPICAL_OINTMENT | OPHTHALMIC | Status: AC
  Filled 2016-05-30: qty 3.5

## 2016-05-30 MED ORDER — ROCURONIUM BROMIDE 50 MG/5ML IV SOSY
PREFILLED_SYRINGE | INTRAVENOUS | Status: AC
  Filled 2016-05-30: qty 5

## 2016-05-30 MED ORDER — KETOROLAC TROMETHAMINE 30 MG/ML IJ SOLN
INTRAMUSCULAR | Status: DC | PRN
  Administered 2016-05-30: 30 mg via INTRAVENOUS

## 2016-05-30 MED ORDER — PHENYLEPHRINE HCL 10 MG/ML IJ SOLN
INTRAMUSCULAR | Status: AC
  Filled 2016-05-30: qty 1

## 2016-05-30 MED ORDER — CIPROFLOXACIN IN D5W 400 MG/200ML IV SOLN
INTRAVENOUS | Status: AC
  Filled 2016-05-30: qty 200

## 2016-05-30 MED ORDER — CIPROFLOXACIN IN D5W 400 MG/200ML IV SOLN
400.0000 mg | INTRAVENOUS | Status: AC
  Administered 2016-05-30: 400 mg via INTRAVENOUS
  Filled 2016-05-30: qty 200

## 2016-05-30 MED ORDER — MIDAZOLAM HCL 2 MG/2ML IJ SOLN
INTRAMUSCULAR | Status: AC
  Filled 2016-05-30: qty 2

## 2016-05-30 MED ORDER — IOHEXOL 300 MG/ML  SOLN
INTRAMUSCULAR | Status: DC | PRN
  Administered 2016-05-30: 7 mL

## 2016-05-30 MED ORDER — FENTANYL CITRATE (PF) 100 MCG/2ML IJ SOLN
25.0000 ug | INTRAMUSCULAR | Status: DC | PRN
  Administered 2016-05-30 (×2): 50 ug via INTRAVENOUS
  Filled 2016-05-30: qty 1

## 2016-05-30 MED ORDER — SODIUM CHLORIDE 0.9 % IJ SOLN
INTRAMUSCULAR | Status: AC
  Filled 2016-05-30: qty 10

## 2016-05-30 MED ORDER — FENTANYL CITRATE (PF) 100 MCG/2ML IJ SOLN
INTRAMUSCULAR | Status: AC
  Filled 2016-05-30: qty 2

## 2016-05-30 MED ORDER — SUGAMMADEX SODIUM 200 MG/2ML IV SOLN
INTRAVENOUS | Status: AC
  Filled 2016-05-30: qty 2

## 2016-05-30 MED ORDER — ONDANSETRON HCL 4 MG/2ML IJ SOLN
INTRAMUSCULAR | Status: AC
  Filled 2016-05-30: qty 4

## 2016-05-30 MED ORDER — TRAMADOL-ACETAMINOPHEN 37.5-325 MG PO TABS: 1.0000 | ORAL_TABLET | Freq: Four times a day (QID) | ORAL | 0 refills | Status: DC | PRN

## 2016-05-30 MED ORDER — DEXAMETHASONE SODIUM PHOSPHATE 10 MG/ML IJ SOLN
INTRAMUSCULAR | Status: AC
  Filled 2016-05-30: qty 1

## 2016-05-30 MED ORDER — ROCURONIUM BROMIDE 100 MG/10ML IV SOLN
INTRAVENOUS | Status: DC | PRN
  Administered 2016-05-30: 30 mg via INTRAVENOUS

## 2016-05-30 MED ORDER — SODIUM CHLORIDE 0.9 % IR SOLN
Status: DC | PRN
  Administered 2016-05-30: 3000 mL

## 2016-05-30 MED ORDER — DEXAMETHASONE SODIUM PHOSPHATE 10 MG/ML IJ SOLN
INTRAMUSCULAR | Status: AC
  Filled 2016-05-30: qty 2

## 2016-05-30 MED ORDER — KETOROLAC TROMETHAMINE 30 MG/ML IJ SOLN
INTRAMUSCULAR | Status: AC
  Filled 2016-05-30: qty 1

## 2016-05-30 MED ORDER — PHENYLEPHRINE HCL 10 MG/ML IJ SOLN
INTRAVENOUS | Status: DC | PRN
  Administered 2016-05-30: 50 ug/min via INTRAVENOUS

## 2016-05-30 MED ORDER — LIDOCAINE 2% (20 MG/ML) 5 ML SYRINGE
INTRAMUSCULAR | Status: DC | PRN
  Administered 2016-05-30: 60 mg via INTRAVENOUS

## 2016-05-30 SURGICAL SUPPLY — 31 items
BAG URINE DRAINAGE (UROLOGICAL SUPPLIES) ×3 IMPLANT
BLADE CLIPPER SURG (BLADE) ×3 IMPLANT
CATH FOLEY 2WAY SLVR  5CC 16FR (CATHETERS) ×4
CATH FOLEY 2WAY SLVR 5CC 16FR (CATHETERS) ×2 IMPLANT
CATH ROBINSON RED A/P 20FR (CATHETERS) ×3 IMPLANT
CLOTH BEACON ORANGE TIMEOUT ST (SAFETY) ×3 IMPLANT
COVER BACK TABLE 60X90IN (DRAPES) ×3 IMPLANT
COVER MAYO STAND STRL (DRAPES) ×3 IMPLANT
DRSG TEGADERM 4X4.75 (GAUZE/BANDAGES/DRESSINGS) ×3 IMPLANT
DRSG TEGADERM 8X12 (GAUZE/BANDAGES/DRESSINGS) ×3 IMPLANT
GAUZE SPONGE 4X4 12PLY STRL LF (GAUZE/BANDAGES/DRESSINGS) ×2 IMPLANT
GLOVE BIO SURGEON STRL SZ7.5 (GLOVE) ×6 IMPLANT
GLOVE ECLIPSE 8.0 STRL XLNG CF (GLOVE) IMPLANT
GOWN STRL REUS W/ TWL LRG LVL3 (GOWN DISPOSABLE) ×1 IMPLANT
GOWN STRL REUS W/ TWL XL LVL3 (GOWN DISPOSABLE) ×1 IMPLANT
GOWN STRL REUS W/TWL LRG LVL3 (GOWN DISPOSABLE) ×3
GOWN STRL REUS W/TWL XL LVL3 (GOWN DISPOSABLE) ×3
HOLDER FOLEY CATH W/STRAP (MISCELLANEOUS) ×3 IMPLANT
IMPL SPACEOAR SYSTEM 10ML (MISCELLANEOUS) IMPLANT
IMPLANT SPACEOAR SYSTEM 10ML (MISCELLANEOUS) ×3
IV NS 1000ML (IV SOLUTION) ×3
IV NS 1000ML BAXH (IV SOLUTION) ×1 IMPLANT
KIT RM TURNOVER CYSTO AR (KITS) ×3 IMPLANT
MANIFOLD NEPTUNE II (INSTRUMENTS) IMPLANT
PACK CYSTO (CUSTOM PROCEDURE TRAY) ×3 IMPLANT
SYRINGE 10CC LL (SYRINGE) ×3 IMPLANT
TUBE CONNECTING 12'X1/4 (SUCTIONS)
TUBE CONNECTING 12X1/4 (SUCTIONS) IMPLANT
UNDERPAD 30X30 INCONTINENT (UNDERPADS AND DIAPERS) ×6 IMPLANT
WATER STERILE IRR 500ML POUR (IV SOLUTION) ×3 IMPLANT
selectSeed 1-125 ×2 IMPLANT

## 2016-05-30 NOTE — Transfer of Care (Signed)
Immediate Anesthesia Transfer of Care Note  Patient: Billy Torres  Procedure(s) Performed: Procedure(s): RADIOACTIVE SEED IMPLANT/BRACHYTHERAPY IMPLANT WITH SPACE OAR (N/A)  Patient Location: PACU  Anesthesia Type:General  Level of Consciousness: awake, alert , oriented and patient cooperative  Airway & Oxygen Therapy: Patient Spontanous Breathing and Patient connected to nasal cannula oxygen  Post-op Assessment: Report given to RN and Post -op Vital signs reviewed and stable  Post vital signs: Reviewed and stable  Last Vitals:  Vitals:   05/30/16 1245 05/30/16 1300  BP: (!) 147/76 (!) 148/77  Pulse: 81 79  Resp: 15 10  Temp:      Last Pain:  Vitals:   05/30/16 1300  TempSrc:   PainSc: 0-No pain      Patients Stated Pain Goal: 5 (07/29/55 5051)  Complications: No apparent anesthesia complications

## 2016-05-30 NOTE — Interval H&P Note (Signed)
History and Physical Interval Note:  05/30/2016 10:05 AM  Billy Torres  has presented today for surgery, with the diagnosis of PROSTATE CANCER  The various methods of treatment have been discussed with the patient and family. After consideration of risks, benefits and other options for treatment, the patient has consented to  Procedure(s): RADIOACTIVE SEED IMPLANT/BRACHYTHERAPY IMPLANT WITH SPACE OAR (N/A) as a surgical intervention .  The patient's history has been reviewed, patient examined, no change in status, stable for surgery.  I have reviewed the patient's chart and labs.  Questions were answered to the patient's satisfaction.     Billy Torres I Billy Torres  GLAD Note:   Goals: Rx prostate Ca, with protection of rectum with SpaceOar Liklihood of Success: Good Alternate Rx: Pt has had opinions with Drs Billy Torres and Billy Torres to discuss both LRRP and EBRT,and will have I-125 seed Rx this AM Disability: ED x 20 yrs. He has taken Viagra ion the distant past. He may want to try meds again, but may be a candidate for injection therapy or surgery in the future.

## 2016-05-30 NOTE — Anesthesia Preprocedure Evaluation (Addendum)
Anesthesia Evaluation  Patient identified by MRN, date of birth, ID band Patient awake    Reviewed: Allergy & Precautions, NPO status , Patient's Chart, lab work & pertinent test results  Airway Mallampati: III  TM Distance: >3 FB Neck ROM: Full    Dental no notable dental hx. (+) Edentulous Upper, Dental Advisory Given, Teeth Intact   Pulmonary Current Smoker,    Pulmonary exam normal breath sounds clear to auscultation       Cardiovascular hypertension, Pt. on medications Normal cardiovascular exam Rhythm:Regular Rate:Normal  ECG: NSR, rate 62 ECHO: - LVEF 65-70%, mild LVH, normal wall motion, indeterminate   diastolic function, normal LA size, normal IVC.   Neuro/Psych Left sided weakness, uses cane to ambulate TIACVA (left sided weakness), Residual Symptoms negative psych ROS   GI/Hepatic negative GI ROS, Neg liver ROS,   Endo/Other  diabetes, Well Controlled, Oral Hypoglycemic Agents  Renal/GU negative Renal ROS  negative genitourinary   Musculoskeletal  (+) Arthritis , Osteoarthritis,    Abdominal   Peds negative pediatric ROS (+)  Hematology negative hematology ROS (+)   Anesthesia Other Findings Hyperlipidemia Pt denies knowing any family hx of high fevers or deaths under anesthesia.  Reproductive/Obstetrics negative OB ROS                       Anesthesia Physical Anesthesia Plan  ASA: III  Anesthesia Plan: General   Post-op Pain Management:    Induction: Intravenous  Airway Management Planned: Oral ETT  Additional Equipment:   Intra-op Plan:   Post-operative Plan:   Informed Consent: I have reviewed the patients History and Physical, chart, labs and discussed the procedure including the risks, benefits and alternatives for the proposed anesthesia with the patient or authorized representative who has indicated his/her understanding and acceptance.   Dental advisory  given  Plan Discussed with: CRNA and Surgeon  Anesthesia Plan Comments:      Anesthesia Quick Evaluation

## 2016-05-30 NOTE — Progress Notes (Signed)
  Radiation Oncology         [phone] ________________________________  Name: Billy Torres MRN: [id number]  Date: 05/30/2016  DOB: [birth year]       Prostate Seed Implant  MW:[id number], Billy Smiles, NP  No ref. provider found  DIAGNOSIS:  61 y.o. gentleman with intermediate Stage T1c adenocarcinoma of the prostate with Gleason's score of 3+4 and PSA of 10.4    ICD-9-CM ICD-10-CM   1. Prostate cancer ([place]) 185 C61 DG Chest 2 View     DG Chest 2 View     Discontinue IV    PROCEDURE: Insertion of radioactive I-125 seeds into the prostate gland.  RADIATION DOSE: 145 Gy, definitive therapy.  TECHNIQUE: [name] was brought to the operating room with the urologist. He was placed in the dorsolithotomy position. He was catheterized and a rectal tube was inserted. The perineum was shaved, prepped and draped. The ultrasound probe was then introduced into the rectum to see the prostate gland.  TREATMENT DEVICE: A needle grid was attached to the ultrasound probe stand and anchor needles were placed.  3D PLANNING: The prostate was imaged in 3D using a sagittal sweep of the prostate probe. These images were transferred to the planning computer. There, the prostate, urethra and rectum were defined on each axial reconstructed image. Then, the software created an optimized 3D plan and a few seed positions were adjusted. The quality of the plan was reviewed using [facility] information for the target and the following two organs at risk:  Urethra and Rectum.  Then the accepted plan was uploaded to the seed Selectron afterloading unit.  PROSTATE VOLUME STUDY:  Using transrectal ultrasound the volume of the prostate was verified to be 63.5 cc.  SPECIAL TREATMENT PROCEDURE/SUPERVISION AND HANDLING: The Nucletron FIRST system was used to place the needles under sagittal guidance. A total of 21 needles were used to deposit 78 seeds in the prostate gland. The individual seed activity was 0.546  mCi.  SpaceOAR:  Gel was instilled after seed placement  COMPLEX SIMULATION: At the end of the procedure, an anterior radiograph of the pelvis was obtained to document seed positioning and count. Cystoscopy was performed to check the urethra and bladder.  MICRODOSIMETRY: At the end of the procedure, the patient was emitting 0.35 mR/hr at 1 meter. Accordingly, he was considered safe for hospital discharge.  PLAN: The patient will return to the radiation oncology clinic for post implant CT dosimetry in three weeks.   ________________________________  Sheral Apley [name], M.D.

## 2016-05-30 NOTE — Transfer of Care (Signed)
Immediate Anesthesia Transfer of Care Note  Patient: Billy Torres  Procedure(s) Performed: Procedure(s): RADIOACTIVE SEED IMPLANT/BRACHYTHERAPY IMPLANT WITH SPACE OAR (N/A)  Patient Location: PACU  Anesthesia Type:General  Level of Consciousness: awake, alert , oriented and patient cooperative  Airway & Oxygen Therapy: Patient Spontanous Breathing and Patient connected to nasal cannula oxygen  Post-op Assessment: Report given to RN and Post -op Vital signs reviewed and stable  Post vital signs: Reviewed and stable  Last Vitals:  Vitals:   05/30/16 1245 05/30/16 1300  BP: (!) 147/76 (!) 148/77  Pulse: 81 79  Resp: 15 10  Temp:      Last Pain:  Vitals:   05/30/16 1300  TempSrc:   PainSc: 0-No pain      Patients Stated Pain Goal: 5 (83/35/82 5189)  Complications: No apparent anesthesia complications

## 2016-05-30 NOTE — Op Note (Signed)
Pre-operative diagnosis :  TcC Adenocarcinoma Prostate  Postoperative diagnosis:  Same  Operation: 1. Implantation of  I-125 radioactive seeds ( 21 needles, 76 seeds, 145Gy)                   2. Implantation of SpaceOar Hydrogelin the prei-rectal/peri-prostatic space  Surgeon:  S. Gaynelle Arabian, MD  First assistant:  None  Radiation Therapist:  Tyler Pita, MD  Anesthesia:  General LMA  Preparation: After appropriate preanesthesia, the patient was brought to the operative room, placed on the operating table in the dorsal supine position where general LMA anesthesia was introduced. He was then replaced in the dorsal lithotomy position and the pubis was prepped with Betadine solution and draped in usual fashion. The arm band was double checked. The history was double checked.  Review history:   GU:   Prostate Cancer - C61   2   BPH w/LUTS - N40.1   3   Elevated PSA - R97.20   4   Family Hx of Prostate Cancer - Z80.42   7   Urge incontinence - N39.41   8   Enuresis, Nocturnal - N39.44   5 NON-GU:   Cerebellar stroke syndrome - G46.4   6   Diabetes Type 2 - E11.9           Notes:   Billy Torres a 61 year old type II diabetic, referred for evaluation of elevated PSA of 8.4, with repeat PSA of 8.7, and 10.4 ( 2.05% free fraction). He is sen today with his son for PCa consultation.  The patient's brother died with prostate cancer( died secondary to lung cancer ). The patient himself has mild bladder outlet symptoms, including incomplete emptying( 3/5), urinary frequency, intermittency, urgency, and nocturia ( 2/5), with IPSS=9 ( normal). His quality of life, however, is 4/6. Rectal exam showed normal sphincter tone-despite recent CVA. He complains of new onset urge incontinence. The patient himself suffered a CVA, in April, 2017, with resultant left upper extremity and left lower extremity weakness. He c/o urge and urge incontinence also, thought 2ndary CVA.  PBx shows : 8 + bx G 3+3=6 CaP 10%-80%  2  + bx G 3+4=7 CP 40%-90% ( R and L mid-medial bxs)   bx's + at both apices and bases: Note pt is not sexually active. ED x 20 years. Hx of responsive to Viagra many yrs ago.   Size: 36.6cc gland  IPSS=9  Testosterone 332  Creatinine: 0.8, gfr= 104 ( normal)  Partin Table for T1c, PSA > 10: organ-confined 52%  Extra-capsular extention: 34%  SV disease: 5%  Lymph node disease ( despite negative CT scan) 5%    Statement of  Likelihood of Success: Excellent. TIME-OUT observed.:  Procedure: Following radiation therapy reevaluation under anesthesia, the patient underwent implantation of 78 radioactive seed implants, in 21 needles, with a total radiation dose of 145 Gy. At the end of the procedure, x-ray was accomplished, to ensure that seeds were in proper position.   Following implantation of radioactive seed implant, I introduced the SpaceOr needle in the perineal midline, and advanced the needle under ultrasound and fluoroscopic guidance with the bevel pointed downward, through the rectourethralis muscle, to the mid point of the Donati Ace fascia, between the prostate and the rectum. 1 mL of saline was introduced into Donati Ace fascia under fluoroscopic control, to evaluate for appropriate needle placement. A second 1 mL puff of saline was introduced to reassure needle placement, and the needle placement was evaluated in  both AP and lateral views. Following this, the SpaceOr hydrogel was injected into Denovillier"s fascia, and allowed to become gel over a 10 second period. Fluoroscopy showed the gel to be in excellent position to protect the rectum.    Following this, flexible cystoscopy was accomplished, and no seeds were identified within the urethra, or in the bladder. Following this, the flexible cystoscope was removed, and a 16 French Foley catheter was placed to straight drainage, with 10 mL of sterile water in the balloon. Clear urine was seen within the bladder, and irrigant after cystoscopy.    The patient was given IV Tylenol during the procedure, and IV Toradol at the end of procedure. He was awakened, taken to recovery room in good condition.

## 2016-05-30 NOTE — Anesthesia Postprocedure Evaluation (Signed)
Anesthesia Post Note  Patient: Billy Torres  Procedure(s) Performed: Procedure(s) (LRB): RADIOACTIVE SEED IMPLANT/BRACHYTHERAPY IMPLANT WITH SPACE OAR (N/A)  Patient location during evaluation: PACU Anesthesia Type: General Level of consciousness: awake and alert Pain management: pain level controlled Vital Signs Assessment: post-procedure vital signs reviewed and stable Respiratory status: spontaneous breathing, nonlabored ventilation, respiratory function stable and patient connected to nasal cannula oxygen Cardiovascular status: blood pressure returned to baseline and stable Postop Assessment: no signs of nausea or vomiting Anesthetic complications: no       Last Vitals:  Vitals:   05/30/16 1300 05/30/16 1315  BP: (!) 148/77   Pulse: 79 80  Resp: 10 10  Temp:      Last Pain:  Vitals:   05/30/16 1300  TempSrc:   PainSc: 0-No pain                 Tametra Ahart P Mccayla Shimada

## 2016-05-30 NOTE — Discharge Instructions (Addendum)
Radioactive Seed Implant Home Care Instructions   Activity: Rest for the remainder of the day.  Do not drive or operate equipment today.  You may resume normal  activities in a few days as instructed by your physician, without risk of harmful radiation exposure to those around you, provided you follow the time and distance precautions on the Radiation Oncology Instruction Sheet.   Meals: Drink plenty of lipuids and eat light foods, such as gelatin or soup this evening .  You may return to normal meal plan tomorrow.  Return To Work: You may return to work as instructed by [employer].  Special Instruction:   If any seeds are found, use tweezers to pick up seeds and place in a glass container of any kind and bring to your physician's office.  Call your physician if any of these symptoms occur:   Persistent or heavy bleeding  Urine stream diminishes or stops completely after catheter is removed  Fever equal to or greater than 101 degrees F  Cloudy urine with a strong foul odor  Severe pain  You may feel some burning pain and/or hesitancy when you urinate after the catheter is removed.  These symptoms may increase over the next few weeks, but should diminish within forur to six weeks.  Applying moist heat to the lower abdomen or a hot tub bath may help relieve the pain.  If the discomfort becomes severe, please call your physician for additional medications.  Follow-up (Date of Return Visit to Physician):   Patient:_______________________________   @DATE @  Nurse:________________________________ @DATE @  Post Anesthesia Home Care Instructions  Activity: Get plenty of rest for the remainder of the day. A responsible individual must stay with you for 24 hours following the procedure.  For the next 24 hours, DO NOT: -Drive a car -Paediatric nurse -Drink alcoholic beverages -Take any medication unless instructed by your physician -Make any legal decisions or sign important  papers.  Meals: Start with liquid foods such as gelatin or soup. Progress to regular foods as tolerated. Avoid greasy, spicy, heavy foods. If nausea and/or vomiting occur, drink only clear liquids until the nausea and/or vomiting subsides. Call your physician if vomiting continues.  Special Instructions/Symptoms: Your throat may feel dry or sore from the anesthesia or the breathing tube placed in your throat during surgery. If this causes discomfort, gargle with warm salt water. The discomfort should disappear within 24 hours.  If you had a scopolamine patch placed behind your ear for the management of post- operative nausea and/or vomiting:  1. The medication in the patch is effective for 72 hours, after which it should be removed.  Wrap patch in a tissue and discard in the trash. Wash hands thoroughly with soap and water. 2. You may remove the patch earlier than 72 hours if you experience unpleasant side effects which may include dry mouth, dizziness or visual disturbances. 3. Avoid touching the patch. Wash your hands with soap and water after contact with the patch.   Brachytherapy for Prostate Cancer Brachytherapy for prostate cancer is radiation treatment that is placed inside of the prostate (prostate gland). There are several types of brachytherapy:  Low-dose rate (LDR) therapy. This may involve temporary implants or permanent radioactive seed or pellet implants. The radiation does not travel far from the prostate, which means that healthy, noncancerous tissues around the prostate receive only a small dose of radiation. This helps to protect those tissues from injury. This type of treatment may be followed by a course  of external beam radiation.  Temporary low-dose implants are left in the prostate for 1-7 days. The implants are needles, applicators, or thin, plastic tubes (catheters) that contain radioactive material. You will need to stay in the hospital while the implant is in  place.  Permanent low-dose implants (seeds or pellets) are injected into the prostate, and they work for up to one year after they are inserted. They are left in place and are not removed.  High-dose rate (HDR) therapy. This is given through needles, applicators, or catheters that contain radioactive material. The tubes are removed after treatment, and no radiation is left in the prostate. This type of treatment may be followed by a course of external beam radiation. Tell a health care provider about:  Any allergies you have.  All medicines you are taking, including vitamins, herbs, eye drops, creams, and over-the-counter medicines.  Any problems you or family members have had with anesthetic medicines.  Any surgeries you have had.  Any blood disorders you have.  Any medical conditions you have. What are the risks? Generally, this is a safe procedure. However, problems may occur, including:  Inflammation of the rectum.  Problems getting or keeping an erection (erectile dysfunction).  Trouble urinating.  Diarrhea.  Bleeding.  Loss of bowel control. What happens before the procedure? Staying hydrated  Follow instructions from your health care provider about hydration, which may include:  Up to 2 hours before the procedure - you may continue to drink clear liquids, such as water, clear fruit juice, black coffee, and plain tea. Eating and drinking  Follow instructions from your health care provider about eating and drinking, which may include:  8 hours before the procedure - stop eating heavy meals or foods such as meat, fried foods, or fatty foods.  6 hours before the procedure - stop eating light meals or foods, such as toast or cereal.  6 hours before the procedure - stop drinking milk or drinks that contain milk.  2 hours before the procedure - stop drinking clear liquids. Medicines   Ask your health care provider about:  Changing or stopping your regular medicines.  This is especially important if you are taking diabetes medicines or blood thinners.  Taking medicines such as aspirin and ibuprofen. These medicines can thin your blood. Do not take these medicines before your procedure if your health care provider instructs you not to.  You may be given antibiotic medicine to help prevent infection. General instructions   Plan to have someone take you home from the hospital or clinic.  If you will be going home right after the procedure, plan to have someone with you for 24 hours.  You may have imaging tests done, including an ultrasound, CT scan, or MRI.  You may have blood tests done.  You may have a test to check the electrical signals in your heart (electrocardiogram).  You may need to take medicine to clean out your bowel (bowel prep). What happens during the procedure?  To lower your risk of infection:  Your health care team will wash or sanitize their hands.  Your skin will be washed with soap.  Hair may be removed from the surgical area.  An IV will be inserted into one of your veins.  You will be given one or more of the following:  A medicine to help you relax (sedative).  A medicine to numb the area (local anesthetic).  A medicine to make you fall asleep (general anesthetic).  You may  have a thin, plastic tube (catheter) inserted to drain your bladder.  If you are receiving brachytherapy with implants:  A needle, applicator, or catheter will be inserted into the prostate. It will be inserted through a body cavity, such as the rectum, or through the tissue between the testicles and the anus (perineum).  An X-ray, ultrasound, MRI, or CT scan will be used to guide the catheter or applicator toward the prostate.  Radioactive seeds, wires, or ribbons will be fed through the catheter or applicator.  If the high-dose method is used:  The radioactive wires or ribbons will be left in for a few minutes and then removed.  Once  the treatment is finished, the catheter or applicator will be removed.  If the low-dose method is used, the implant will stay in place for 1-7 days.  You will remain in the hospital while the implant is in place.  Once the treatment is finished, the radioactive material and catheter will be removed.  If you are receiving permanent, low-dose brachytherapy:  Small, radioactive seeds or pellets will be injected into your prostate. This may be done through a catheter, needle, or applicator.  The catheter or applicator will be removed, leaving the seeds in the prostate. The procedure may vary among health care providers and hospitals. What happens after the procedure?  Your blood pressure, heart rate, breathing rate, and blood oxygen level will be monitored until the medicines you were given have worn off.  Do not drive for 24 hours if you were given a sedative. Summary  Brachytherapy for prostate cancer is radiation treatment placed inside of the prostate (prostate gland).  There are several types of brachytherapy for prostate cancer, including low-dose temporary treatment, low-dose permanent treatment, and high-dose temporary treatment.  Temporary low-dose implants are left in the prostate for 1-7 days.  Permanent low-dose implants are injected into the prostate and left in place. They work for up to one year after they are inserted.  Permanent high-dose therapy is given through tubes that contain radioactive material. The tubes are removed after treatment, and no radiation is left in the prostate. This information is not intended to replace advice given to you by your health care provider. Make sure you discuss any questions you have with your health care provider. Document Released: 06/03/2005 Document Revised: 01/03/2016 Document Reviewed: 01/03/2016 Elsevier Interactive Patient Education  2017 [employer].

## 2016-05-30 NOTE — Anesthesia Procedure Notes (Signed)
Procedure Name: Intubation Date/Time: 05/30/2016 10:18 AM Performed by: Wanita Chamberlain Pre-anesthesia Checklist: Patient identified, Timeout performed, Emergency Drugs available, Suction available and Patient being monitored Patient Re-evaluated:Patient Re-evaluated prior to inductionOxygen Delivery Method: Circle system utilized Preoxygenation: Pre-oxygenation with 100% oxygen Intubation Type: IV induction Ventilation: Mask ventilation without difficulty Laryngoscope Size: Mac and 3 Grade View: Grade II Tube type: Oral Number of attempts: 1 Airway Equipment and Method: Stylet Placement Confirmation: ETT inserted through vocal cords under direct vision,  positive ETCO2 and breath sounds checked- equal and bilateral Secured at: 22 cm Tube secured with: Tape Dental Injury: Teeth and Oropharynx as per pre-operative assessment

## 2016-06-03 ENCOUNTER — Emergency Department (HOSPITAL_COMMUNITY)
Admission: EM | Admit: 2016-06-03 | Discharge: 2016-06-03 | Disposition: A | Discharge status: Home / Self Care | Payer: Medicare (Managed Care) | Attending: Emergency Medicine | Admitting: Emergency Medicine

## 2016-06-03 ENCOUNTER — Encounter (HOSPITAL_COMMUNITY): Payer: Self-pay | Admitting: Emergency Medicine

## 2016-06-03 DIAGNOSIS — F17210 Nicotine dependence, cigarettes, uncomplicated: Secondary | ICD-10-CM | POA: Diagnosis not present

## 2016-06-03 DIAGNOSIS — E119 Type 2 diabetes mellitus without complications: Secondary | ICD-10-CM | POA: Insufficient documentation

## 2016-06-03 DIAGNOSIS — Z8673 Personal history of transient ischemic attack (TIA), and cerebral infarction without residual deficits: Secondary | ICD-10-CM | POA: Diagnosis not present

## 2016-06-03 DIAGNOSIS — I10 Essential (primary) hypertension: Secondary | ICD-10-CM | POA: Insufficient documentation

## 2016-06-03 DIAGNOSIS — Z79899 Other long term (current) drug therapy: Secondary | ICD-10-CM | POA: Diagnosis not present

## 2016-06-03 DIAGNOSIS — Z7984 Long term (current) use of oral hypoglycemic drugs: Secondary | ICD-10-CM | POA: Diagnosis not present

## 2016-06-03 DIAGNOSIS — Z8546 Personal history of malignant neoplasm of prostate: Secondary | ICD-10-CM | POA: Diagnosis not present

## 2016-06-03 DIAGNOSIS — Z466 Encounter for fitting and adjustment of urinary device: Secondary | ICD-10-CM | POA: Insufficient documentation

## 2016-06-03 NOTE — Discharge Instructions (Signed)
Please call the urology clinic (information listed) tomorrow morning to ask about follow-up care. Return to ER for new or worsening symptoms, any additional concerns.

## 2016-06-03 NOTE — ED Provider Notes (Signed)
[facility] DEPT Provider Note   CSN: [id number] Arrival date & time: 06/03/16  1010     History   Chief Complaint Chief Complaint  Patient presents with  . Foley Catheter Problem    HPI Billy Torres is a 61 y.o. male.  The history is provided by the patient and medical records. No language interpreter was used.   Billy Torres is a 61 y.o. male  with a PMH of prostate cancer with recent radioactive seed / brachytherapy implant performed by urology, Dr. Gaynelle Arabian, on 5/24. He presents to the Emergency Department today to have foley catheter removed. He states that he was told by Dr. Gaynelle Arabian to follow up in the clinic on Monday to have catheter removed, however he drove by the office today and no one was there. Today (Monday) is Memorial Day so the office is closed. He is not having any abdominal pain. Foley feels uncomfortable, but otherwise no complaints.    Past Medical History:  Diagnosis Date  . Abnormal rate of speech    post stroke  . Anticoagulant long-term use    plavix  . At high risk for falls   . Chronic constipation   . Cognitive deficit due to old cerebral infarction   . Degenerative cervical spinal stenosis   . Frequency of urination   . Gait disturbance, post-stroke    uses cane  . History of ischemic left MCA stroke 06/27/2013   w/ evidence hemorrhagia, high grade stenosis bilateral proximal MCA and extensive small matter disease--- symptoms resolved  . History of ischemic stroke without residual deficits 05-14-2015---  residual left hemiparesis and cognitive deficits   small nonhemorrhegic infarct right corona radiata posterior limb and progressive right m2 stenosis  . Hyperlipidemia   . Hypertension   . Left hemiparesis (HCC)    RESIDUAL FROM STROKE--  WALKS W/ CANE  . Mild atherosclerosis of carotid artery, right    RICA 0-39% per duplex 06-28-2013  . OA (osteoarthritis)    LEFT SHOULDER  . Prostate cancer [facility]) urologist-  dr  Gaynelle Arabian  oncologist-  dr [name]   dx 12/ 2017-- Stage T1c, Gleason 3+4,  PSA 10.4,  vol 51cc  . Sensorineural hearing loss, bilateral   . Type 2 diabetes mellitus treated with insulin ([place])   . Urge incontinence    wears depends  . Wears dentures     Patient Active Problem List   Diagnosis Date Noted  . Malignant neoplasm of prostate ([place]) 02/26/2016  . Muscle spasticity   . Dementia   . Cerebral infarction due to unspecified mechanism   . Leukocytosis   . Acute thrombotic stroke 05/17/2015  . Diabetes mellitus due to underlying condition with complication, without long-term current use of insulin ([place])   . Gait disturbance, post-stroke   . Hemiparesis ([place])   . Cognitive deficit due to old cerebral infarction   . Benign essential HTN   . Diabetes mellitus type 2 in nonobese (HCC)   . Essential hypertension   . History of CVA (cerebrovascular accident) without residual deficits   . Ataxia, post-stroke   . Dysarthria, post-stroke   . Tobacco abuse   . HLD (hyperlipidemia)   . Acute CVA (cerebrovascular accident) ([place]) 05/15/2015  . Left-sided weakness 05/14/2015  . Right shoulder pain 03/03/2014  . History of CVA (cerebrovascular accident) 02/28/2014  . Smoking 06/29/2013  . CVA (cerebral infarction) 06/29/2013  . Dyslipidemia 06/29/2013  . TIA (transient ischemic attack) 06/27/2013  . Diabetes ([place]) 06/27/2013  Past Surgical History:  Procedure Laterality Date  . ANTERIOR CERVICAL DECOMP/DISCECTOMY FUSION  11/27/1999   C5 -- C6  . CIRCUMCISION  10/07/2001  . FOOT SURGERY  2005  . PROSTATE BIOPSY  12/12/2015  . SHOULDER ARTHROSCOPY WITH SUBACROMIAL DECOMPRESSION Left 11/28/2006   Debridement/ Acromioplasty/  CA ligament relesase  . TRANSTHORACIC ECHOCARDIOGRAM  05/15/2015   mild LVH,  ef 65-70%, codominant mitral inflow-iindeterminate diastolic function/         Home Medications    Prior to Admission medications   Medication Sig Start Date End Date  Taking? Authorizing Provider  acetaminophen (TYLENOL) 650 MG CR tablet Take 650 mg by mouth 3 (three) times daily.   Yes [provider]  amLODipine (NORVASC) 5 MG tablet Take 5 mg by mouth daily.   Yes [provider]  atorvastatin (LIPITOR) 20 MG tablet Take 20 mg by mouth daily.   Yes [provider]  clopidogrel (PLAVIX) 75 MG tablet Take 1 tablet (75 mg total) by mouth daily with breakfast. 11/14/15  Yes Micheline Chapman, NP  donepezil (ARICEPT) 10 MG tablet Take 10 mg by mouth at bedtime.   Yes [provider]  losartan (COZAAR) 25 MG tablet Take 12.5 mg by mouth daily.   Yes [provider]  metFORMIN (GLUCOPHAGE-XR) 500 MG 24 hr tablet Take 500 mg by mouth 2 times daily at 12 noon and 4 pm.   Yes [provider]  pregabalin (LYRICA) 100 MG capsule Take 100 mg by mouth 2 (two) times daily.   Yes [provider]  sennosides-docusate sodium (SENOKOT-S) 8.6-50 MG tablet Take 2 tablets by mouth at bedtime as needed for constipation.   Yes [provider]  traMADol-acetaminophen (ULTRACET) 37.5-325 MG tablet Take 1 tablet by mouth every 6 (six) hours as needed. 05/30/16  Yes Carolan Clines, MD  trimethoprim (TRIMPEX) 100 MG tablet Take 1 tablet (100 mg total) by mouth 1 day or 1 dose. 05/30/16  Yes Carolan Clines, MD    Family History Family History  Problem Relation Age of Onset  . Hypertension Mother   . Hypertension Father   . Cancer Brother        prostate    Social History Social History  Substance Use Topics  . Smoking status: Current Some Day Smoker    Packs/day: 0.25    Years: 45.00    Types: Cigarettes  . Smokeless tobacco: Never Used     Comment: continues to smoke approx. 1 to 2 cig's per month  . Alcohol use No     Allergies   Patient has no known allergies.   Review of Systems Review of Systems  Genitourinary: Positive for hematuria.  All other systems reviewed and are  negative.    Physical Exam Updated Vital Signs BP (!) 135/104 (BP Location: Right Arm)   Pulse 93   Temp 98.4 F (36.9 C) (Oral)   Ht 5\' 8"  (1.727 m)   Wt 72.6 kg (160 lb)   SpO2 98%   BMI 24.33 kg/m   Physical Exam  Constitutional: He is oriented to person, place, and time. He appears well-developed and well-nourished. No distress.  HENT:  Head: Normocephalic and atraumatic.  Cardiovascular: Normal rate, regular rhythm and normal heart sounds.   No murmur heard. Pulmonary/Chest: Effort normal and breath sounds normal. No respiratory distress.  Abdominal: Soft. He exhibits no distension. There is no tenderness.  Genitourinary: Testes normal. No penile erythema or penile tenderness.  Genitourinary Comments: Foley in place.  Neurological: He is alert and oriented to person, place, and time.  Skin: Skin is warm and dry.  Nursing note and vitals reviewed.    ED Treatments / Results  Labs (all labs ordered are listed, but only abnormal results are displayed) Labs Reviewed - No data to display  EKG  EKG Interpretation None       Radiology No results found.  Procedures Procedures (including critical care time)  Medications Ordered in ED Medications - No data to display   Initial Impression / Assessment and Plan / ED Course  I have reviewed the triage vital signs and the nursing notes.  Pertinent labs & imaging results that were available during my care of the patient were reviewed by me and considered in my medical decision making (see chart for details).    Billy Torres is a 61 y.o. male who presents to ED Requesting Foley catheter be removed. Patient states catheter was placed by urology and he was told to follow-up in the office on Monday for Foley removal. Monday (today) is Memorial Day and the office was closed. I discussed the case with on-call urology, Dr. Jeffie Pollock, who looked through records and states that he was indeed supposed to have Foley removed  today. Recommends removing in ER and having patient call clinic tomorrow morning to inquire about follow-up care with Dr. Gaynelle Arabian. Relayed above recommendations to patient who agrees. Foley removed without complications. Patient with no other complaints. Agrees to call urology tomorrow and understands return precautions. All questions answered.  Final Clinical Impressions(s) / ED Diagnoses   Final diagnoses:  Encounter for Foley catheter removal    New Prescriptions Discharge Medication List as of 06/03/2016 12:20 PM       Mayco Walrond, Ozella Almond, PA-C 06/03/16 1345    Duffy Bruce, MD 06/04/16 [phone]

## 2016-06-03 NOTE — ED Notes (Signed)
Pt left before receiving discharge paperwork or signing signature pad.

## 2016-06-03 NOTE — ED Triage Notes (Signed)
Pt presents requesting foley catheter to be taken out; pt verbalizes was supposed to have it taken out today but Urology but their offices are closed. Pt continues to verbalize standard drainage bag is leaking. With assessment spout noted to open causing leak; standard drainage bag emptied and spout closed.

## 2016-06-04 ENCOUNTER — Encounter (HOSPITAL_BASED_OUTPATIENT_CLINIC_OR_DEPARTMENT_OTHER): Payer: Self-pay | Admitting: Urology

## 2016-06-11 ENCOUNTER — Other Ambulatory Visit: Payer: Self-pay | Admitting: Urology

## 2016-06-11 DIAGNOSIS — C61 Malignant neoplasm of prostate: Secondary | ICD-10-CM

## 2016-06-13 ENCOUNTER — Telehealth: Payer: Self-pay | Admitting: *Deleted

## 2016-06-13 NOTE — Telephone Encounter (Signed)
Called this patient's transpo. to inform of appts. for 06-28-16 and his MRI, lvm for a return call

## 2016-06-28 ENCOUNTER — Ambulatory Visit: Payer: Medicare (Managed Care) | Admitting: Radiation Oncology

## 2016-06-28 ENCOUNTER — Ambulatory Visit
Admission: RE | Admit: 2016-06-28 | Discharge: 2016-06-28 | Disposition: A | Discharge status: Home / Self Care | Payer: Medicare (Managed Care) | Source: Ambulatory Visit | Attending: Radiation Oncology | Admitting: Radiation Oncology

## 2016-06-28 ENCOUNTER — Inpatient Hospital Stay
Admission: RE | Admit: 2016-06-28 | Discharge: 2016-06-28 | Disposition: A | Discharge status: Home / Self Care | Payer: Medicaid Other | Source: Ambulatory Visit | Attending: Radiation Oncology | Admitting: Radiation Oncology

## 2016-06-28 ENCOUNTER — Ambulatory Visit (HOSPITAL_COMMUNITY)
Admission: RE | Admit: 2016-06-28 | Discharge: 2016-06-28 | Disposition: A | Discharge status: Home / Self Care | Payer: Medicare (Managed Care) | Source: Ambulatory Visit | Attending: Urology | Admitting: Urology

## 2016-06-28 DIAGNOSIS — C61 Malignant neoplasm of prostate: Secondary | ICD-10-CM | POA: Diagnosis present

## 2016-07-31 ENCOUNTER — Other Ambulatory Visit: Payer: Self-pay | Admitting: Internal Medicine

## 2016-07-31 ENCOUNTER — Ambulatory Visit
Admission: RE | Admit: 2016-07-31 | Discharge: 2016-07-31 | Disposition: A | Discharge status: Home / Self Care | Payer: Medicare (Managed Care) | Source: Ambulatory Visit | Attending: Internal Medicine | Admitting: Internal Medicine

## 2016-07-31 DIAGNOSIS — K5900 Constipation, unspecified: Secondary | ICD-10-CM

## 2016-08-07 ENCOUNTER — Other Ambulatory Visit: Payer: Self-pay | Admitting: Radiation Oncology

## 2016-08-07 ENCOUNTER — Other Ambulatory Visit: Payer: Self-pay | Admitting: Internal Medicine

## 2016-08-07 ENCOUNTER — Telehealth: Payer: Self-pay | Admitting: *Deleted

## 2016-08-07 DIAGNOSIS — C61 Malignant neoplasm of prostate: Secondary | ICD-10-CM

## 2016-08-07 NOTE — Telephone Encounter (Signed)
Called [facility] to inform of post seed appts. On 08-29-16, lvm for a return call

## 2016-08-15 ENCOUNTER — Other Ambulatory Visit: Payer: Self-pay | Admitting: Internal Medicine

## 2016-08-15 ENCOUNTER — Encounter (HOSPITAL_COMMUNITY): Payer: Self-pay

## 2016-08-15 ENCOUNTER — Inpatient Hospital Stay (HOSPITAL_COMMUNITY)
Admission: AC | Admit: 2016-08-15 | Discharge: 2016-08-16 | DRG: 699 | Disposition: A | Discharge status: Skilled Nursing Facility | Payer: Medicare (Managed Care) | Source: Ambulatory Visit | Attending: Oncology | Admitting: Oncology

## 2016-08-15 DIAGNOSIS — E785 Hyperlipidemia, unspecified: Secondary | ICD-10-CM | POA: Diagnosis present

## 2016-08-15 DIAGNOSIS — N135 Crossing vessel and stricture of ureter without hydronephrosis: Secondary | ICD-10-CM | POA: Diagnosis present

## 2016-08-15 DIAGNOSIS — Z8249 Family history of ischemic heart disease and other diseases of the circulatory system: Secondary | ICD-10-CM | POA: Diagnosis not present

## 2016-08-15 DIAGNOSIS — Z8546 Personal history of malignant neoplasm of prostate: Secondary | ICD-10-CM

## 2016-08-15 DIAGNOSIS — I69391 Dysphagia following cerebral infarction: Secondary | ICD-10-CM | POA: Diagnosis not present

## 2016-08-15 DIAGNOSIS — G8190 Hemiplegia, unspecified affecting unspecified side: Secondary | ICD-10-CM

## 2016-08-15 DIAGNOSIS — R000 Tachycardia, unspecified: Secondary | ICD-10-CM | POA: Diagnosis not present

## 2016-08-15 DIAGNOSIS — E119 Type 2 diabetes mellitus without complications: Secondary | ICD-10-CM | POA: Diagnosis present

## 2016-08-15 DIAGNOSIS — N3289 Other specified disorders of bladder: Secondary | ICD-10-CM | POA: Diagnosis not present

## 2016-08-15 DIAGNOSIS — K5900 Constipation, unspecified: Secondary | ICD-10-CM | POA: Diagnosis present

## 2016-08-15 DIAGNOSIS — C61 Malignant neoplasm of prostate: Secondary | ICD-10-CM | POA: Diagnosis not present

## 2016-08-15 DIAGNOSIS — N1330 Unspecified hydronephrosis: Secondary | ICD-10-CM | POA: Diagnosis present

## 2016-08-15 DIAGNOSIS — Z86010 Personal history of colonic polyps: Secondary | ICD-10-CM | POA: Diagnosis not present

## 2016-08-15 DIAGNOSIS — I10 Essential (primary) hypertension: Secondary | ICD-10-CM | POA: Diagnosis present

## 2016-08-15 DIAGNOSIS — F17210 Nicotine dependence, cigarettes, uncomplicated: Secondary | ICD-10-CM | POA: Diagnosis present

## 2016-08-15 DIAGNOSIS — Z8673 Personal history of transient ischemic attack (TIA), and cerebral infarction without residual deficits: Secondary | ICD-10-CM

## 2016-08-15 DIAGNOSIS — Z923 Personal history of irradiation: Secondary | ICD-10-CM | POA: Diagnosis not present

## 2016-08-15 DIAGNOSIS — M1990 Unspecified osteoarthritis, unspecified site: Secondary | ICD-10-CM | POA: Diagnosis present

## 2016-08-15 DIAGNOSIS — H903 Sensorineural hearing loss, bilateral: Secondary | ICD-10-CM | POA: Diagnosis present

## 2016-08-15 DIAGNOSIS — N320 Bladder-neck obstruction: Principal | ICD-10-CM | POA: Diagnosis present

## 2016-08-15 DIAGNOSIS — I69319 Unspecified symptoms and signs involving cognitive functions following cerebral infarction: Secondary | ICD-10-CM | POA: Diagnosis not present

## 2016-08-15 DIAGNOSIS — Z794 Long term (current) use of insulin: Secondary | ICD-10-CM | POA: Diagnosis not present

## 2016-08-15 DIAGNOSIS — R338 Other retention of urine: Secondary | ICD-10-CM | POA: Diagnosis present

## 2016-08-15 DIAGNOSIS — K5909 Other constipation: Secondary | ICD-10-CM | POA: Diagnosis not present

## 2016-08-15 DIAGNOSIS — N131 Hydronephrosis with ureteral stricture, not elsewhere classified: Secondary | ICD-10-CM | POA: Diagnosis not present

## 2016-08-15 DIAGNOSIS — I69354 Hemiplegia and hemiparesis following cerebral infarction affecting left non-dominant side: Secondary | ICD-10-CM

## 2016-08-15 DIAGNOSIS — R1310 Dysphagia, unspecified: Secondary | ICD-10-CM | POA: Diagnosis not present

## 2016-08-15 LAB — URINALYSIS, ROUTINE W REFLEX MICROSCOPIC
BILIRUBIN URINE: NEGATIVE
GLUCOSE, UA: NEGATIVE mg/dL
HGB URINE DIPSTICK: NEGATIVE
Ketones, ur: NEGATIVE mg/dL
Leukocytes, UA: NEGATIVE
Nitrite: NEGATIVE
PROTEIN: NEGATIVE mg/dL
Specific Gravity, Urine: 1.008 (ref 1.005–1.030)
pH: 6 (ref 5.0–8.0)

## 2016-08-15 LAB — CBC
HEMATOCRIT: 29.4 % — AB (ref 39.0–52.0)
HEMOGLOBIN: 9.7 g/dL — AB (ref 13.0–17.0)
MCH: 27.6 pg (ref 26.0–34.0)
MCHC: 33 g/dL (ref 30.0–36.0)
MCV: 83.8 fL (ref 78.0–100.0)
Platelets: 337 10*3/uL (ref 150–400)
RBC: 3.51 MIL/uL — ABNORMAL LOW (ref 4.22–5.81)
RDW: 13 % (ref 11.5–15.5)
WBC: 9.9 10*3/uL (ref 4.0–10.5)

## 2016-08-15 LAB — COMPREHENSIVE METABOLIC PANEL
ALBUMIN: 3.2 g/dL — AB (ref 3.5–5.0)
ALK PHOS: 75 U/L (ref 38–126)
ALT: 12 U/L — ABNORMAL LOW (ref 17–63)
AST: 17 U/L (ref 15–41)
Anion gap: 10 (ref 5–15)
BILIRUBIN TOTAL: 0.9 mg/dL (ref 0.3–1.2)
BUN: 11 mg/dL (ref 6–20)
CALCIUM: 9.2 mg/dL (ref 8.9–10.3)
CO2: 28 mmol/L (ref 22–32)
Chloride: 102 mmol/L (ref 101–111)
Creatinine, Ser: 1.57 mg/dL — ABNORMAL HIGH (ref 0.61–1.24)
GFR calc Af Amer: 53 mL/min — ABNORMAL LOW (ref >60)
GFR, EST NON AFRICAN AMERICAN: 46 mL/min — AB (ref >60)
GLUCOSE: 176 mg/dL — AB (ref 65–99)
POTASSIUM: 3.4 mmol/L — AB (ref 3.5–5.1)
Sodium: 140 mmol/L (ref 135–145)
TOTAL PROTEIN: 7.1 g/dL (ref 6.5–8.1)

## 2016-08-15 MED ORDER — LACTULOSE 10 GM/15ML PO SOLN
20.0000 g | Freq: Every day | ORAL | Status: DC
  Administered 2016-08-15 – 2016-08-16 (×2): 20 g via ORAL
  Filled 2016-08-15 (×2): qty 30

## 2016-08-15 MED ORDER — ONDANSETRON HCL 4 MG/2ML IJ SOLN: 4.0000 mg | Freq: Four times a day (QID) | INTRAMUSCULAR | Status: DC | PRN

## 2016-08-15 MED ORDER — KETOROLAC TROMETHAMINE 15 MG/ML IJ SOLN
15.0000 mg | Freq: Four times a day (QID) | INTRAMUSCULAR | Status: DC | PRN
  Administered 2016-08-15 – 2016-08-16 (×2): 15 mg via INTRAVENOUS
  Filled 2016-08-15 (×2): qty 1

## 2016-08-15 MED ORDER — KCL IN DEXTROSE-NACL 20-5-0.45 MEQ/L-%-% IV SOLN
INTRAVENOUS | Status: DC
  Administered 2016-08-15: 18:00:00 via INTRAVENOUS
  Filled 2016-08-15 (×2): qty 1000

## 2016-08-15 MED ORDER — SENNA 8.6 MG PO TABS
2.0000 | ORAL_TABLET | Freq: Every day | ORAL | Status: DC
  Administered 2016-08-15 – 2016-08-16 (×2): 17.2 mg via ORAL
  Filled 2016-08-15 (×2): qty 2

## 2016-08-15 MED ORDER — HYDROCORTISONE ACETATE 25 MG RE SUPP
25.0000 mg | Freq: Two times a day (BID) | RECTAL | Status: DC
  Administered 2016-08-15 – 2016-08-16 (×2): 25 mg via RECTAL
  Filled 2016-08-15 (×2): qty 1

## 2016-08-15 MED ORDER — ONDANSETRON HCL 4 MG PO TABS: 4.0000 mg | ORAL_TABLET | Freq: Four times a day (QID) | ORAL | Status: DC | PRN

## 2016-08-15 MED ORDER — ENOXAPARIN SODIUM 40 MG/0.4ML ~~LOC~~ SOLN
40.0000 mg | SUBCUTANEOUS | Status: DC
  Administered 2016-08-15 – 2016-08-16 (×2): 40 mg via SUBCUTANEOUS
  Filled 2016-08-15 (×2): qty 0.4

## 2016-08-15 MED ORDER — MINERAL OIL RE ENEM
1.0000 | ENEMA | Freq: Once | RECTAL | Status: AC
  Administered 2016-08-15: 1 via RECTAL
  Filled 2016-08-15: qty 1

## 2016-08-15 MED ORDER — ACETAMINOPHEN 650 MG RE SUPP: 650.0000 mg | Freq: Three times a day (TID) | RECTAL | Status: DC | PRN

## 2016-08-15 MED ORDER — IOPAMIDOL (ISOVUE-300) INJECTION 61%
INTRAVENOUS | Status: AC
Start: 2016-08-15 — End: 2016-08-15
  Administered 2016-08-15: 120 mL
  Filled 2016-08-15: qty 30

## 2016-08-15 NOTE — H&P (Signed)
Date: 08/15/2016               Patient Name:  Billy Torres MRN: [id number]  DOB: [birth year] Age / Sex: 61 y.o., male   PCP: Angelica Pou, MD         Medical Service: Internal Medicine Teaching Service         Attending Physician: Dr. Annia Belt, MD    First Contact: Dr. Rochele Pages Pager: [phone]  Second Contact: Dr. Jule Ser Pager: [phone]       After Hours (After 5p/  First Contact Pager: [phone]  weekends / holidays): Second Contact Pager: [phone]   Chief Complaint: Severe Constipation  History of Present Illness:  Billy Torres is a 61 year old gentleman with PMH of multiple CVAs (with residual left sided weakness, cognitive impairment, LUE pain, and dysphagia), HTN, HLD, T2DM, Prostate Cancer s/p brachytherapy and seeds (05/2016), Osteoarthritis, and bilateral sensorineural hearing loss who presents as a direct admission from [facility] facility for severe constipation and worsening abdominal pain. He is a patient of Dr. Jimmye Norman with [facility], admitted to [facility] about 1 week ago.  Patient reports 3 weeks of constipation with only occasional small watery or solid bowel movements. He has noticed increased distension of his abdomen during this time. He denies any associated nausea, vomiting, fevers, chills, or dyspnea. His medical providers have attempted to relieve his constipation with twice daily senokot, milk of magnesia, and enemas without success. He is not taking opioid medications.  An abdominal xray was obtained on 8/1 which showed fecal material in the right colon with a non-obstructive bowel gas pattern. Another abdominal xray on 8/8 was read as worsened stool burden compared to prior, mixed air and stool with some interspersed areas of minimally distended colon which may reflect fecal retention or decreased colonic motility. Ileus or obstruction was not excluded definitively. He was sent to [facility] as a  direct admission for further workup and management and request for GI consultation.  His most recent bowel movement was the day of admission. The patient notes it is small in caliber with associated straining. It did not improve his abdominal pain or bloating. The patient has been passing gas. He also has associated decreased appetite and food avoidance.   Patient was also noted to have developed new bilateral lower extremity edema. He states this began last week and is very painful, 8/10 in severity, with associated decrease in functional status. He denies weakness, numbness, tingling. Previous ECHO 05/2015: LVEF 65-70%, mild LVH, normal wall motion, indeterminate diastolic function, normal LA size, normal IVC.   Prior endoscopic history available: 07/2004: Colonoscopy - 35 mm cecal polyp (excised) and multiple polyps from cecum to transverse colon. Pathology: MULTIPLE FRAGMENTS OF ADENOMATOUS POLYPWITH HIGH GRADE GLANDULAR DYSPLASIA.  10/2005: Colonoscopy - Prior large cecal polyp adequately excised. Pathology HYPERPLASTIC POLYP(S). NO ADENOMATOUS CHANGESOR EVIDENCE OF MALIGNANCY IDENTIFIED (BIOPSY).  Meds:  No outpatient prescriptions have been marked as taking for the 08/15/16 encounter [facility] Encounter).     Allergies: Allergies as of 08/15/2016  . (No Known Allergies)   Past Medical History:  Diagnosis Date  . Abnormal rate of speech    post stroke  . Anticoagulant long-term use    plavix  . At high risk for falls   . Chronic constipation   . Cognitive deficit due to old cerebral infarction   . Degenerative cervical spinal stenosis   . Frequency of urination   .  Gait disturbance, post-stroke    uses cane  . History of ischemic left MCA stroke 06/27/2013   w/ evidence hemorrhagia, high grade stenosis bilateral proximal MCA and extensive small matter disease--- symptoms resolved  . History of ischemic stroke without residual deficits 05-14-2015---  residual left hemiparesis  and cognitive deficits   small nonhemorrhegic infarct right corona radiata posterior limb and progressive right m2 stenosis  . Hyperlipidemia   . Hypertension   . Left hemiparesis (HCC)    RESIDUAL FROM STROKE--  WALKS W/ CANE  . Mild atherosclerosis of carotid artery, right    RICA 0-39% per duplex 06-28-2013  . OA (osteoarthritis)    LEFT SHOULDER  . Prostate cancer [facility]) urologist-  dr Gaynelle Arabian  oncologist-  dr [name]   dx 12/ 2017-- Stage T1c, Gleason 3+4,  PSA 10.4,  vol 51cc  . Sensorineural hearing loss, bilateral   . Type 2 diabetes mellitus treated with insulin ([place])   . Urge incontinence    wears depends  . Wears dentures     Family History:  Family History  Problem Relation Age of Onset  . Hypertension Mother   . Hypertension Father   . Cancer Brother        prostate    Social History:  Social History   Social History Main Topics  . Smoking status: Current Some Day Smoker    Packs/day: 0.25    Years: 45.00    Types: Cigarettes  . Smokeless tobacco: Never Used     Comment: continues to smoke approx. 1 to 2 cig's per month  . Alcohol use No  . Drug use: No  . Sexual activity: Yes     Review of Systems: A complete ROS was negative except as per HPI.  Physical Exam: Blood pressure (!) 162/90, pulse (!) 105, temperature 98.5 F (36.9 C), temperature source Oral, resp. rate 20, height 5\' 8"  (1.727 m), weight 172 lb (78 kg), SpO2 98 %. Physical Exam  Constitutional: He is oriented to person, place, and time. He appears well-developed and well-nourished. No distress.  HENT:  Head: Normocephalic and atraumatic.  Eyes: Pupils are equal, round, and reactive to light. Conjunctivae and EOM are normal.  Neck: Normal range of motion. Neck supple.  Cardiovascular: Normal rate, regular rhythm and normal heart sounds.   Abdominal: Normal appearance. He exhibits distension. He exhibits no mass. Bowel sounds are decreased. There is tenderness (mild diffuse  tenderness).  Musculoskeletal: Normal range of motion. He exhibits edema (2+ pitting).  Lower extremity strength 4/5 bilaterally. No erythema or tenderness to palpation.   Neurological: He is alert and oriented to person, place, and time.  Skin: Skin is warm.  Psychiatric: He has a normal mood and affect. His behavior is normal.    EKG: none to review on admission  CXR: none to review on admission  Assessment & Plan by Problem: Principal Problem:   Colonic constipation Active Problems:   Dyslipidemia   History of CVA (cerebrovascular accident)   Diabetes mellitus type 2 in nonobese (HCC)   Hemiparesis (HCC)   Cognitive deficit due to old cerebral infarction   Benign essential HTN  Colonic Constipation Unclear etiology for the patients prolonged constipation. He is not on any medications that could cause constipation and has not had any changes in his medications.  -Mineral enema, Sennakot daily, lactulose 20 g daily -CT abdomen in the AM -GI consulted, f/u recs -NPO -Pain management with Toradol   Lower Extremity Edema New onset, unclear eitiology.  Bilateral, pitting edema without eythema or tenderness on exam, so less concerning for DVT. Previous ECHO in 05/2015 LVEF 65-70%, mild LVH, normal wall motion, indeterminate diastolic function.  -Consider LE US doppler -Possibly repeat ECHO  -UA    Dispo: Admit patient to Observation with expected length of stay less than 2 midnights.  Signed: Arvil Chaco, MD Internal Medicine PGY1 Pager # [phone]

## 2016-08-16 ENCOUNTER — Inpatient Hospital Stay (HOSPITAL_COMMUNITY): Payer: Medicare (Managed Care)

## 2016-08-16 ENCOUNTER — Encounter (HOSPITAL_COMMUNITY): Payer: Self-pay | Admitting: Surgery

## 2016-08-16 DIAGNOSIS — I69354 Hemiplegia and hemiparesis following cerebral infarction affecting left non-dominant side: Secondary | ICD-10-CM

## 2016-08-16 DIAGNOSIS — Z8249 Family history of ischemic heart disease and other diseases of the circulatory system: Secondary | ICD-10-CM

## 2016-08-16 DIAGNOSIS — I69391 Dysphagia following cerebral infarction: Secondary | ICD-10-CM

## 2016-08-16 DIAGNOSIS — F17210 Nicotine dependence, cigarettes, uncomplicated: Secondary | ICD-10-CM

## 2016-08-16 DIAGNOSIS — N1330 Unspecified hydronephrosis: Secondary | ICD-10-CM

## 2016-08-16 DIAGNOSIS — C61 Malignant neoplasm of prostate: Secondary | ICD-10-CM

## 2016-08-16 DIAGNOSIS — H903 Sensorineural hearing loss, bilateral: Secondary | ICD-10-CM

## 2016-08-16 DIAGNOSIS — Z923 Personal history of irradiation: Secondary | ICD-10-CM

## 2016-08-16 DIAGNOSIS — N131 Hydronephrosis with ureteral stricture, not elsewhere classified: Secondary | ICD-10-CM

## 2016-08-16 DIAGNOSIS — M19012 Primary osteoarthritis, left shoulder: Secondary | ICD-10-CM

## 2016-08-16 DIAGNOSIS — R1310 Dysphagia, unspecified: Secondary | ICD-10-CM

## 2016-08-16 DIAGNOSIS — R000 Tachycardia, unspecified: Secondary | ICD-10-CM

## 2016-08-16 DIAGNOSIS — I10 Essential (primary) hypertension: Secondary | ICD-10-CM

## 2016-08-16 DIAGNOSIS — N320 Bladder-neck obstruction: Principal | ICD-10-CM

## 2016-08-16 DIAGNOSIS — E119 Type 2 diabetes mellitus without complications: Secondary | ICD-10-CM

## 2016-08-16 DIAGNOSIS — Z8042 Family history of malignant neoplasm of prostate: Secondary | ICD-10-CM

## 2016-08-16 DIAGNOSIS — Z79899 Other long term (current) drug therapy: Secondary | ICD-10-CM

## 2016-08-16 DIAGNOSIS — N3289 Other specified disorders of bladder: Secondary | ICD-10-CM

## 2016-08-16 DIAGNOSIS — I69319 Unspecified symptoms and signs involving cognitive functions following cerebral infarction: Secondary | ICD-10-CM

## 2016-08-16 DIAGNOSIS — Z86010 Personal history of colonic polyps: Secondary | ICD-10-CM

## 2016-08-16 DIAGNOSIS — Z960 Presence of urogenital implants: Secondary | ICD-10-CM

## 2016-08-16 DIAGNOSIS — K5909 Other constipation: Secondary | ICD-10-CM

## 2016-08-16 DIAGNOSIS — E785 Hyperlipidemia, unspecified: Secondary | ICD-10-CM

## 2016-08-16 LAB — CBC
HCT: 29.4 % — ABNORMAL LOW (ref 39.0–52.0)
HEMOGLOBIN: 9.6 g/dL — AB (ref 13.0–17.0)
MCH: 27.7 pg (ref 26.0–34.0)
MCHC: 32.7 g/dL (ref 30.0–36.0)
MCV: 84.7 fL (ref 78.0–100.0)
Platelets: 337 10*3/uL (ref 150–400)
RBC: 3.47 MIL/uL — ABNORMAL LOW (ref 4.22–5.81)
RDW: 13.4 % (ref 11.5–15.5)
WBC: 7.9 10*3/uL (ref 4.0–10.5)

## 2016-08-16 LAB — COMPREHENSIVE METABOLIC PANEL
ALT: 12 U/L — ABNORMAL LOW (ref 17–63)
ANION GAP: 9 (ref 5–15)
AST: 15 U/L (ref 15–41)
Albumin: 3.1 g/dL — ABNORMAL LOW (ref 3.5–5.0)
Alkaline Phosphatase: 72 U/L (ref 38–126)
BILIRUBIN TOTAL: 0.5 mg/dL (ref 0.3–1.2)
BUN: 10 mg/dL (ref 6–20)
CO2: 27 mmol/L (ref 22–32)
Calcium: 9.1 mg/dL (ref 8.9–10.3)
Chloride: 102 mmol/L (ref 101–111)
Creatinine, Ser: 1.46 mg/dL — ABNORMAL HIGH (ref 0.61–1.24)
GFR, EST AFRICAN AMERICAN: 58 mL/min — AB (ref >60)
GFR, EST NON AFRICAN AMERICAN: 50 mL/min — AB (ref >60)
Glucose, Bld: 171 mg/dL — ABNORMAL HIGH (ref 65–99)
Potassium: 3.5 mmol/L (ref 3.5–5.1)
Sodium: 138 mmol/L (ref 135–145)
TOTAL PROTEIN: 7.3 g/dL (ref 6.5–8.1)

## 2016-08-16 LAB — TSH: TSH: 0.724 u[IU]/mL (ref 0.350–4.500)

## 2016-08-16 LAB — IRON AND TIBC
IRON: 47 ug/dL (ref 45–182)
SATURATION RATIOS: 17 % — AB (ref 17.9–39.5)
TIBC: 269 ug/dL (ref 250–450)
UIBC: 222 ug/dL

## 2016-08-16 LAB — HIV ANTIBODY (ROUTINE TESTING W REFLEX): HIV Screen 4th Generation wRfx: NONREACTIVE

## 2016-08-16 LAB — GLUCOSE, CAPILLARY: Glucose-Capillary: 184 mg/dL — ABNORMAL HIGH (ref 65–99)

## 2016-08-16 LAB — FERRITIN: FERRITIN: 371 ng/mL — AB (ref 24–336)

## 2016-08-16 MED ORDER — METFORMIN HCL ER 500 MG PO TB24: 500.0000 mg | ORAL_TABLET | Freq: Two times a day (BID) | ORAL | 11 refills | Status: DC

## 2016-08-16 MED ORDER — KETOROLAC TROMETHAMINE 15 MG/ML IJ SOLN
15.0000 mg | Freq: Four times a day (QID) | INTRAMUSCULAR | Status: DC | PRN
  Administered 2016-08-16: 15 mg via INTRAVENOUS
  Filled 2016-08-16: qty 1

## 2016-08-16 MED ORDER — INSULIN ASPART 100 UNIT/ML ~~LOC~~ SOLN
0.0000 [IU] | Freq: Three times a day (TID) | SUBCUTANEOUS | Status: DC
  Administered 2016-08-16: 2 [IU] via SUBCUTANEOUS

## 2016-08-16 MED ORDER — AMLODIPINE BESYLATE 5 MG PO TABS
5.0000 mg | ORAL_TABLET | Freq: Every day | ORAL | Status: DC
  Administered 2016-08-16: 5 mg via ORAL
  Filled 2016-08-16: qty 1

## 2016-08-16 MED ORDER — INSULIN ASPART 100 UNIT/ML ~~LOC~~ SOLN: 0.0000 [IU] | Freq: Every day | SUBCUTANEOUS | Status: DC

## 2016-08-16 MED ORDER — LOSARTAN POTASSIUM 25 MG PO TABS
12.5000 mg | ORAL_TABLET | Freq: Every day | ORAL | Status: DC
  Administered 2016-08-16: 12.5 mg via ORAL
  Filled 2016-08-16 (×3): qty 0.5

## 2016-08-16 MED ORDER — DONEPEZIL HCL 10 MG PO TABS: 10.0000 mg | ORAL_TABLET | Freq: Every day | ORAL | Status: DC

## 2016-08-16 MED ORDER — PREGABALIN 50 MG PO CAPS
100.0000 mg | ORAL_CAPSULE | Freq: Two times a day (BID) | ORAL | Status: DC
  Administered 2016-08-16: 100 mg via ORAL
  Filled 2016-08-16: qty 2

## 2016-08-16 MED ORDER — TAMSULOSIN HCL 0.4 MG PO CAPS: 0.4000 mg | ORAL_CAPSULE | Freq: Every day | ORAL | Status: DC

## 2016-08-16 MED ORDER — CLOPIDOGREL BISULFATE 75 MG PO TABS
75.0000 mg | ORAL_TABLET | Freq: Every day | ORAL | Status: DC
  Administered 2016-08-16: 75 mg via ORAL
  Filled 2016-08-16: qty 1

## 2016-08-16 MED ORDER — ATORVASTATIN CALCIUM 20 MG PO TABS
20.0000 mg | ORAL_TABLET | Freq: Every day | ORAL | Status: DC
  Administered 2016-08-16: 20 mg via ORAL
  Filled 2016-08-16: qty 1

## 2016-08-16 MED ORDER — PREGABALIN 100 MG PO CAPS: 100.0000 mg | ORAL_CAPSULE | Freq: Two times a day (BID) | ORAL | Status: DC

## 2016-08-16 NOTE — Clinical Social Work Placement (Signed)
   CLINICAL SOCIAL WORK PLACEMENT  NOTE  Date:  08/16/2016  Patient Details  Name: Billy Torres MRN: [id number] Date of Birth: May 28, 1955  Clinical Social Work is seeking post-discharge placement for this patient at the [place] level of care (*CSW will initial, date and re-position this form in  chart as items are completed):      Patient/family provided with [facility] Work Department's list of facilities offering this level of care within the geographic area requested by the patient (or if unable, by the patient's family).  Yes   Patient/family informed of their freedom to choose among providers that offer the needed level of care, that participate in Medicare, Medicaid or managed care program needed by the patient, have an available bed and are willing to accept the patient.      Patient/family informed of [facility]'s ownership interest in [facility] and [facility], as well as of the fact that they are under no obligation to receive care at these facilities.  PASRR submitted to EDS on       PASRR number received on 08/16/16     Existing PASRR number confirmed on       FL2 transmitted to all facilities in geographic area requested by pt/family on 08/16/16     FL2 transmitted to all facilities within larger geographic area on       Patient informed that his/her managed care company has contracts with or will negotiate with certain facilities, including the following:        Yes   Patient/family informed of bed offers received.  Patient chooses bed at [place] recommends and patient chooses bed at      Patient to be transferred to [facility] on 08/16/16.  Patient to be transferred to facility by [facility]     Patient family notified on 08/16/16 of transfer.  Name of family member notified:        PHYSICIAN Please prepare priority discharge summary, including medications, Please  prepare prescriptions, Please sign FL2     Additional Comment:    _______________________________________________ Eileen Stanford, LCSW 08/16/2016, 2:00 PM

## 2016-08-16 NOTE — Clinical Social Work Note (Signed)
Clinical Social Work Assessment  Patient Details  Name: Billy Torres MRN: [id number] Date of Birth: 06-14-1955  Date of referral:  08/16/16               Reason for consult:  Facility Placement                Permission sought to share information with:  Family Supports Permission granted to share information::  Yes, Verbal Permission Granted  Name::     Camera operator::     Relationship::  Son  Sport and exercise psychologist Information:     Housing/Transportation Living arrangements for the past 2 months:  [place], [place] of Information:  Patient Patient Interpreter Needed:  None Criminal Activity/Legal Involvement Pertinent to Current Situation/Hospitalization:  No - Comment as needed Significant Relationships:  Adult Children Lives with:  Self Do you feel safe going back to the place where you live?    Need for family participation in patient care:     Care giving concerns:  No family or friends present at bedside during initial assessment.    Social Worker assessment / plan:  CSW spoke with pt at beside. Pt is from [place]. Pt is agreeable to return today. Pt has a son who he agreed CSW could contact if needed. Plan is for pt to return home after SNF. Pt has insurance with [employer]--[facility] was contacted.  Employment status:    Insurance information:   [employer]) PT Recommendations:  [place] / Referral to community resources:  [place]  Patient/Family's Response to care:  Pt verbalized understanding of CSW role and expressed appreciation for support. Pt denies any concern regarding pt care at this time.   Patient/Family's Understanding of and Emotional Response to Diagnosis, Current Treatment, and Prognosis:  Pt understanding and realistic regarding physical limitations. Pt understands the need for SNF placement at d/c. Pt agreeable to SNF placement at d/c, at this time. Pt's responses emotionally  appropriate during conversation with CSW. Pt denies any concern regarding treatment plan at this time. CSW will continue to provide support and facilitate d/c needs.   Emotional Assessment Appearance:    Attitude/Demeanor/Rapport:   (Patient was appropriate.) Affect (typically observed):  Accepting, Appropriate, Calm Orientation:  Oriented to Self, Oriented to Place, Oriented to  Time, Oriented to Situation Alcohol / Substance use:  Not Applicable Psych involvement (Current and /or in the community):  No (Comment)  Discharge Needs  Concerns to be addressed:  No discharge needs identified Readmission within the last 30 days:  No Current discharge risk:  None Barriers to Discharge:  Continued Medical Work up   [employer], LCSW 08/16/2016, 2:43 PM

## 2016-08-16 NOTE — Clinical Social Work Note (Signed)
Clinical Social Worker facilitated patient discharge including contacting patient family and facility to confirm patient discharge plans.  Clinical information faxed to facility and family agreeable with plan.  CSW arranged ambulance transport via [facility] to [place] .  RN to call [phone]  for report prior to discharge. Patient will go to room 204.  Clinical Social Worker will sign off for now as social work intervention is no longer needed. Please consult [place] again if new need arises.  [place], [place]

## 2016-08-16 NOTE — Progress Notes (Signed)
   Subjective: Patient seen and examined. He states he continues to have abdominal pain. CT of the abdomen and pelvis showed a dramatically distended bladder. The patient states he has been having difficulty urinating this past week. He states he has to strain and sometimes very little urine will come out. He has no other complaints at this time.   Objective:  Vital signs in last 24 hours: Vitals:   08/15/16 2045 08/16/16 0601 08/16/16 0648 08/16/16 0945  BP: (!) 153/80 (!) 159/80  (!) 143/76  Pulse: 81 89  73  Resp: 20 20  18   Temp: 98.3 F (36.8 C) 98.4 F (36.9 C)    TempSrc: Oral Oral    SpO2: 99% 100%    Weight:   171 lb 14.4 oz (78 kg)   Height:       Physical Exam  Constitutional: He is oriented to person, place, and time. He appears well-developed and well-nourished.  HENT:  Head: Normocephalic and atraumatic.  Eyes: Pupils are equal, round, and reactive to light. Conjunctivae and EOM are normal.  Neck: Normal range of motion. Neck supple.  Cardiovascular: Normal rate, regular rhythm, normal heart sounds and intact distal pulses.   Pulmonary/Chest: Effort normal and breath sounds normal.  Abdominal: He exhibits distension. There is tenderness.  Musculoskeletal: Normal range of motion. He exhibits edema. He exhibits no tenderness.  Neurological: He is alert and oriented to person, place, and time.  Skin: Skin is warm and dry.  Psychiatric: He has a normal mood and affect. His behavior is normal.    Assessment/Plan:  Principal Problem:   Colonic constipation Active Problems:   Dyslipidemia   History of CVA (cerebrovascular accident)   Diabetes mellitus type 2 in nonobese (HCC)   Hemiparesis (HCC)   Cognitive deficit due to old cerebral infarction   Benign essential HTN  Bladder distention and urinary retention Unclear etiology at this time.  CT of the abdomen showed massive bladder distention with mild to moderate bilateral hydroureteronephrosis. Bladder volume  calculation is as high as 2900 cc. Bladder outlet obstruction suspected. Foley catheter placement removed 2400 cc of urine. He is on no anticholinergic medication that could cause urinary retention. May be due to radiation effect on the perineum. -Foley catheter placed  -Consult Urology -Repeat BMET in AM  Constipation CT of the abdomen showed no stool burden. His constipation may be due to bladder distention and compression of the sigmoid colon.  -Hold bowel regimen -No longer need GI consult  Lower Extremity Edema New onset, unclear eitiology. Bilateral, pitting edema without eythema or tenderness on exam, so less concerning for DVT. Previous ECHO in 05/2015 LVEF 65-70%, mild LVH, normal wall motion, indeterminate diastolic function. UA negative for protein.  -Consider LE US doppler -Consider repeat ECHO   Type 2 DM -SSI   Dispo: Anticipated discharge in approximately 1-2 days.  Melanee Spry, MD 08/16/2016, 12:57 PM Pager: [phone]

## 2016-08-16 NOTE — NC FL2 (Signed)
[place] LEVEL OF CARE SCREENING TOOL     IDENTIFICATION  Patient Name: Billy Torres: 17-Sep-1955 Sex: male Admission Date (Current Location): 08/15/2016  [facility] and [place] Number:  Herbalist and Address:  [facility], [place] [address] [place], [place] [address]      Provider Number: [id number]  Attending Physician Name and Address:  Annia Belt, MD  Relative Name and Phone Number:       Current Level of Care: Hospital Recommended Level of Care: [place] Prior Approval Number:    Date Approved/Denied:   PASRR Number: [id number]  Discharge Plan: SNF    Current Diagnoses: Patient Active Problem List   Diagnosis Date Noted  . Acute urinary retention 08/15/2016  . Malignant neoplasm of prostate ([place]) 02/26/2016  . Muscle spasticity   . Dementia   . Cerebral infarction due to unspecified mechanism   . Leukocytosis   . Acute thrombotic stroke 05/17/2015  . Diabetes mellitus due to underlying condition with complication, without long-term current use of insulin ([place])   . Gait disturbance, post-stroke   . Hemiparesis ([place])   . Cognitive deficit due to old cerebral infarction   . Benign essential HTN   . Diabetes mellitus type 2 in nonobese (HCC)   . Essential hypertension   . History of CVA (cerebrovascular accident) without residual deficits   . Ataxia, post-stroke   . Dysarthria, post-stroke   . Tobacco abuse   . HLD (hyperlipidemia)   . Acute CVA (cerebrovascular accident) ([place]) 05/15/2015  . Left-sided weakness 05/14/2015  . Right shoulder pain 03/03/2014  . History of CVA (cerebrovascular accident) 02/28/2014  . Smoking 06/29/2013  . CVA (cerebral infarction) 06/29/2013  . Dyslipidemia 06/29/2013  . TIA (transient ischemic attack) 06/27/2013  . Diabetes ([place]) 06/27/2013    Orientation RESPIRATION BLADDER Height & Weight     Self, Time, Situation, Place  Normal  Incontinent, Indwelling catheter ([place] Catheter placed 8/10.) Weight: 171 lb 14.4 oz (78 kg) Height:  5\' 8"  (172.7 cm)  BEHAVIORAL SYMPTOMS/MOOD NEUROLOGICAL BOWEL NUTRITION STATUS      Continent  (Please see d/c summary)  AMBULATORY STATUS COMMUNICATION OF NEEDS Skin   Limited Assist Verbally Normal                       Personal Care Assistance Level of Assistance  Bathing, Feeding, Dressing Bathing Assistance: Limited assistance Feeding assistance: Independent Dressing Assistance: Limited assistance     Functional Limitations Info  Sight, Hearing, Speech Sight Info: Adequate Hearing Info: Adequate Speech Info: Adequate    SPECIAL CARE FACTORS FREQUENCY  PT (By licensed PT), OT (By licensed OT)     PT Frequency: 5x OT Frequency: 5x            Contractures Contractures Info: Not present    Additional Factors Info  Code Status, Allergies Code Status Info: Full Code Allergies Info: No known allergies           Current Medications (08/16/2016):  This is the current hospital active medication list Current Facility-Administered Medications  Medication Dose Route Frequency Provider Last Rate Last Dose  . acetaminophen (TYLENOL) suppository 650 mg  650 mg Rectal Q8H PRN Zada Finders, MD      . amLODipine (NORVASC) tablet 5 mg  5 mg Oral Daily Melanee Spry, MD   5 mg at 08/16/16 0946  . atorvastatin (LIPITOR) tablet 20 mg  20 mg Oral  [id number] Melanee Spry, MD      . clopidogrel (PLAVIX) tablet 75 mg  75 mg Oral Daily Melanee Spry, MD   75 mg at 08/16/16 1343  . donepezil (ARICEPT) tablet 10 mg  10 mg Oral QHS Lacroce, Hulen Shouts, MD      . enoxaparin (LOVENOX) injection 40 mg  40 mg Subcutaneous Q24H Zada Finders, MD   40 mg at 08/15/16 1750  . hydrocortisone (ANUSOL-HC) suppository 25 mg  25 mg Rectal BID Zada Finders, MD   25 mg at 08/16/16 0940  . insulin aspart (novoLOG) injection 0-5 Units  0-5 Units Subcutaneous QHS Lacroce, Hulen Shouts,  MD      . insulin aspart (novoLOG) injection 0-9 Units  0-9 Units Subcutaneous TID WC Lacroce, Hulen Shouts, MD      . ketorolac (TORADOL) 15 MG/ML injection 15 mg  15 mg Intravenous Q6H PRN Annia Belt, MD   15 mg at 08/16/16 1238  . losartan (COZAAR) tablet 12.5 mg  12.5 mg Oral Daily Melanee Spry, MD   12.5 mg at 08/16/16 1326  . ondansetron (ZOFRAN) tablet 4 mg  4 mg Oral Q6H PRN Zada Finders, MD       Or  . ondansetron (ZOFRAN) injection 4 mg  4 mg Intravenous Q6H PRN Zada Finders, MD      . pregabalin (LYRICA) capsule 100 mg  100 mg Oral BID Melanee Spry, MD   100 mg at 08/16/16 1343     Discharge Medications: Please see discharge summary for a list of discharge medications.  Relevant Imaging Results:  Relevant Lab Results:   Additional Information SSN: [id number]  Eileen Stanford, LCSW

## 2016-08-16 NOTE — Discharge Summary (Signed)
Name: Billy Torres MRN: [id number] DOB: 08/11/1955 61 y.o. PCP: Angelica Pou, MD  Date of Admission: 08/15/2016  3:09 PM Date of Discharge: 08/16/2016 Attending Physician: Annia Belt, MD  Discharge Diagnosis: 1. Bladder outlet obstruction 2. Urinary Retention due to #1 3. Bladder Distention due to #2  Principal Problem:   Acute urinary retention Active Problems:   Dyslipidemia   History of CVA (cerebrovascular accident)   Diabetes mellitus type 2 in nonobese (HCC)   Hemiparesis (HCC)   Cognitive deficit due to old cerebral infarction   Benign essential HTN   Discharge Medications: Allergies as of 08/16/2016   No Known Allergies     Medication List    STOP taking these medications   NON FORMULARY   trimethoprim 100 MG tablet Commonly known as:  TRIMPEX     TAKE these medications   acetaminophen 650 MG CR tablet Commonly known as:  TYLENOL Take 650 mg by mouth 3 (three) times daily.   amLODipine 5 MG tablet Commonly known as:  NORVASC Take 5 mg by mouth daily.   atorvastatin 20 MG tablet Commonly known as:  LIPITOR Take 20 mg by mouth daily.   bisacodyl 10 MG suppository Commonly known as:  DULCOLAX Place 10 mg rectally daily as needed for moderate constipation ((2 of 4)). If not relieved by MOM   clopidogrel 75 MG tablet Commonly known as:  PLAVIX Take 1 tablet (75 mg total) by mouth daily with breakfast.   diltiazem 2 % Gel Apply 1 application topically 2 (two) times daily.   donepezil 10 MG tablet Commonly known as:  ARICEPT Take 10 mg by mouth at bedtime.   hydrocortisone 25 MG suppository Commonly known as:  ANUSOL-HC Place 25 mg rectally 2 (two) times daily.   losartan 25 MG tablet Commonly known as:  COZAAR Take 12.5 mg by mouth daily.   magnesium hydroxide 400 MG/5ML suspension Commonly known as:  MILK OF MAGNESIA Take 30 mLs by mouth daily as needed for mild constipation (Constipation Orders (1 of 4)). If no BM  in 3 days   Melatonin 5 MG Tabs Take 5-10 mg by mouth See admin instructions. Give 5mg  at bed time for insomnia.  May give additional 5mg  as needed 1 hour after the first dose if still unable to sleep.   metFORMIN 500 MG 24 hr tablet Commonly known as:  GLUCOPHAGE XR Take 1 tablet (500 mg total) by mouth 2 (two) times daily.   pantoprazole 40 MG tablet Commonly known as:  PROTONIX Take 40 mg by mouth daily.   polyethylene glycol packet Commonly known as:  MIRALAX / GLYCOLAX Take 17 g by mouth daily.   pregabalin 100 MG capsule Commonly known as:  LYRICA Take 1 capsule (100 mg total) by mouth 2 (two) times daily.   RA SALINE ENEMA 19-7 GM/118ML Enem Place 1 Container rectally daily as needed. If not relieved by Biscodyl suppository then give once in 24 hrs.   sennosides-docusate sodium 8.6-50 MG tablet Commonly known as:  SENOKOT-S Take 2 tablets by mouth 2 (two) times daily.       Disposition and follow-up:   Mr.Hero N Carmen was discharged from [facility] in Stable condition.   1. At the hospital follow up visit please address:  - Patient needs to follow up with Urology within 1 week of discharge for Foley Care.  Please schedule appointment with [facility]. - Patient to continue to follow with [facility] -  Whether patient symptoms of constipation have resolved after decompression of bladder.  If not, consider further work up of constipation.  2.  Labs / imaging needed at time of follow-up: BMET  3.  Pending labs/ test needing follow-up: Myeloma screening labs.  Follow-up Appointments: Follow-up Information    Angelica Pou, MD Follow up.   Specialty:  Internal Medicine Why:  For hospital follow up. Contact information: [place] [address] [phone]        [facility]. Call.   Why:  Make an appointment within 1 week for Foley catheter care. Contact information: [place]  [place] [phone]          Hospital Course by problem list:  1. Bladder outlet obstruction resulting in urinary retention and bladder distention:  CT abdomen and pelvis showed massive bladder distention with mild to moderate bilateral hydronephrosis.  Creatinine mildly elevated at 1.4 this morning from previously normal baseline 1 year ago.  Foley catheter was placed and approximately 2.5 liters of urine was drained.  Patient not on any anti-cholinergic medications that would be considered the culprit for his retention.  He is status post prostate cancer with radioactive seed / brachytherapy implant performed by urology, Dr. Gaynelle Arabian, on 05/30/2016.  His current case was discussed with Urology, Dr. Alyson Ingles, who recommended leaving Foley in place, discharge back to SNF, and follow up with outpatient urology within 1 week.  2. Constipation: Initial presentation thought to be secondary to constipation.  However, CT of the abdomen did not show any stool burden and his constipation was felt to be due to bladder distention and compression of the sigmoid colon.  Follow his bowel habits at discharge to see if constipation resolves with decompression of bladder.  3. Lower Extremity Edema: newer onset with unclear etiology.  Bilateral, pitting edema without erythema or tenderness on exam so there was not concern for DVT.  Additionally, vitals stable without tachycardia, tachypnea, or hypoxemia.  Previous ECHO in May 2017 with LV EF 65-70%, mild LVH, normal wall motion, indeterminate diastolic function.  UA this admission also negative for protein and albumin on CMET was mildly decreased to 3.2.  Could consider repeating an ECHO as an outpatient.  Discharge Vitals:   BP (!) 143/76 (BP Location: Right Arm)   Pulse 73   Temp 98.4 F (36.9 C) (Oral)   Resp 18   Ht 5\' 8"  (1.727 m)   Wt 171 lb 14.4 oz (78 kg)   SpO2 100%   BMI 26.14 kg/m   Pertinent Labs, Studies, and  Procedures:  CT Abdomen and Pelvis: 1. Massive bladder distention with mild to moderate bilateral hydroureteronephrosis. Bladder volume calculation is as high as 2900 cc. Bladder outlet obstruction suspected. 2. Interval placement of brachytherapy seeds in the prostate gland. There is also a new 2.0 x 2.9 cm fluid collection between the prostate gland in the lower rectum. This may represent an area of postprocedure fluid or hematoma. Abscess cannot be excluded. 3. Mild circumferential wall thickening in the distal rectum. 4. No substantial stool volume to suggest clinical constipation. 5. Stable right renal cyst. 6.  Aortic Atherosclerois (ICD10-170.0)  CBC . CBC Latest Ref Rng & Units 08/16/2016 08/15/2016 05/30/2016  WBC 4.0 - 10.5 K/uL 7.9 9.9 -  Hemoglobin 13.0 - 17.0 g/dL 9.6(L) 9.7(L) 13.6  Hematocrit 39.0 - 52.0 % 29.4(L) 29.4(L) 40.0  Platelets 150 - 400 K/uL 337 337 -    CMP  Latest Ref Rng & Units 08/16/2016 08/15/2016 05/30/2016  Glucose 65 - 99 mg/dL 171(H) 176(H) 179(H)  BUN 6 - 20 mg/dL 10 11 -  Creatinine 0.61 - 1.24 mg/dL 1.46(H) 1.57(H) -  Sodium 135 - 145 mmol/L 138 140 144  Potassium 3.5 - 5.1 mmol/L 3.5 3.4(L) 4.0  Chloride 101 - 111 mmol/L 102 102 -  CO2 22 - 32 mmol/L 27 28 -  Calcium 8.9 - 10.3 mg/dL 9.1 9.2 -  Total Protein 6.5 - 8.1 g/dL 7.3 7.1 -  Total Bilirubin 0.3 - 1.2 mg/dL 0.5 0.9 -  Alkaline Phos 38 - 126 U/L 72 75 -  AST 15 - 41 U/L 15 17 -  ALT 17 - 63 U/L 12(L) 12(L) -    Discharge Instructions: Discharge Instructions    Diet - low sodium heart healthy    Complete by:  As directed    Discharge instructions    Complete by:  As directed    Please resume medications you were taking prior to hospitalization.   Please follow up with [facility] within one week. Keep the foley catheter in until your appointment with them.   Increase activity slowly    Complete by:  As directed       Signed: Jule Ser, DO 08/16/2016, 3:33  PM   Pager: [phone]

## 2016-08-16 NOTE — Progress Notes (Signed)
Patient for discharge to [facility], waiting for transport. Called report to Lizbeth Bark, LPN using SBAR. All questions were answered.

## 2016-08-16 NOTE — Clinical Social Work Note (Addendum)
CSW got call from MD that pt is ready to d/c back to [facility]. [facility] contacted. [facility] agreeable to take pt back.   [place], [place]

## 2016-08-19 LAB — PROTEIN ELECTROPHORESIS, SERUM
A/G Ratio: 0.8 (ref 0.7–1.7)
ALBUMIN ELP: 3 g/dL (ref 2.9–4.4)
ALPHA-1-GLOBULIN: 0.4 g/dL (ref 0.0–0.4)
Alpha-2-Globulin: 1.2 g/dL — ABNORMAL HIGH (ref 0.4–1.0)
BETA GLOBULIN: 1.1 g/dL (ref 0.7–1.3)
GAMMA GLOBULIN: 1.3 g/dL (ref 0.4–1.8)
Globulin, Total: 4 g/dL — ABNORMAL HIGH (ref 2.2–3.9)
Total Protein ELP: 7 g/dL (ref 6.0–8.5)

## 2016-08-19 LAB — KAPPA/LAMBDA LIGHT CHAINS
Kappa free light chain: 37.8 mg/L — ABNORMAL HIGH (ref 3.3–19.4)
Kappa, lambda light chain ratio: 1.21 (ref 0.26–1.65)
LAMDA FREE LIGHT CHAINS: 31.2 mg/L — AB (ref 5.7–26.3)

## 2016-08-19 LAB — IMMUNOFIXATION ELECTROPHORESIS
IGM, SERUM: 126 mg/dL (ref 20–172)
IgA: 339 mg/dL (ref 61–437)
IgG (Immunoglobin G), Serum: 1149 mg/dL (ref 700–1600)
Total Protein ELP: 7 g/dL (ref 6.0–8.5)

## 2016-08-26 ENCOUNTER — Telehealth: Payer: Self-pay | Admitting: *Deleted

## 2016-08-28 ENCOUNTER — Telehealth: Payer: Self-pay | Admitting: *Deleted

## 2016-08-28 NOTE — Telephone Encounter (Signed)
CALLED PATIENT TO REMIND OF APPTS. FOR 08-29-16, LVM FOR A RETURN CALL

## 2016-08-29 ENCOUNTER — Ambulatory Visit (HOSPITAL_COMMUNITY): Payer: Medicare (Managed Care)

## 2016-08-29 ENCOUNTER — Ambulatory Visit: Payer: Medicare (Managed Care)

## 2016-08-29 ENCOUNTER — Encounter: Payer: Self-pay | Admitting: Medical Oncology

## 2016-08-29 ENCOUNTER — Ambulatory Visit
Admission: RE | Admit: 2016-08-29 | Discharge: 2016-08-29 | Disposition: A | Discharge status: Home / Self Care | Payer: Medicare (Managed Care) | Source: Ambulatory Visit | Attending: Radiation Oncology | Admitting: Radiation Oncology

## 2016-08-29 ENCOUNTER — Encounter: Payer: Self-pay | Admitting: Radiation Oncology

## 2016-08-29 DIAGNOSIS — E119 Type 2 diabetes mellitus without complications: Secondary | ICD-10-CM | POA: Insufficient documentation

## 2016-08-29 DIAGNOSIS — Z794 Long term (current) use of insulin: Secondary | ICD-10-CM | POA: Insufficient documentation

## 2016-08-29 DIAGNOSIS — E785 Hyperlipidemia, unspecified: Secondary | ICD-10-CM | POA: Diagnosis not present

## 2016-08-29 DIAGNOSIS — C61 Malignant neoplasm of prostate: Secondary | ICD-10-CM | POA: Insufficient documentation

## 2016-08-29 DIAGNOSIS — Z8249 Family history of ischemic heart disease and other diseases of the circulatory system: Secondary | ICD-10-CM | POA: Diagnosis not present

## 2016-08-29 DIAGNOSIS — I10 Essential (primary) hypertension: Secondary | ICD-10-CM | POA: Diagnosis not present

## 2016-08-29 DIAGNOSIS — Z7902 Long term (current) use of antithrombotics/antiplatelets: Secondary | ICD-10-CM | POA: Diagnosis not present

## 2016-08-29 DIAGNOSIS — Z79899 Other long term (current) drug therapy: Secondary | ICD-10-CM | POA: Insufficient documentation

## 2016-08-29 DIAGNOSIS — Z8042 Family history of malignant neoplasm of prostate: Secondary | ICD-10-CM | POA: Diagnosis not present

## 2016-08-29 DIAGNOSIS — Z87891 Personal history of nicotine dependence: Secondary | ICD-10-CM | POA: Insufficient documentation

## 2016-08-29 DIAGNOSIS — R32 Unspecified urinary incontinence: Secondary | ICD-10-CM | POA: Diagnosis not present

## 2016-08-29 NOTE — Progress Notes (Signed)
Radiation Oncology         [phone] ________________________________  Name: Billy Torres MRN: [id number]  Date: 08/29/2016  DOB: 01-31-1955  Follow-Up Visit Note  CC: Angelica Pou, MD  Raynelle Bring, MD  Diagnosis:   61 y.o. gentleman with intermediate Stage T1c adenocarcinoma of the prostate with Gleason's score of 3+4 and PSA of 10.4.     ICD-10-CM   1. Malignant neoplasm of prostate (HCC) C61    Interval Since Last Radiation:  13 weeks 05/30/16: Insertion of radioactive I-125 seeds into the prostate gland; 145 Gy, definitive therapy.  Narrative:  The patient returns today for routine follow-up. Patient was accompained by a [facility] nurse, serving as an extra pair of ears for the patient. The patient currently has an indwelling foley catheter in place due to progressive worsening of BOO sxs following his brachytherapy procedure.  He presented to the ER on 08/16/16 with complaints of abdominal pain, constipation and difficulty voiding. CT of the abdomen showed massive bladder distention with mild to moderate bilateral hydroureteronephrosis. Bladder volume calculation was as high as 2900 cc. Foley catheter placement removed 2400 cc of urine and patient was discharged home with foley catheter in place.  He had a follow up visit with [facility] on 08/22/16 and he was advised to maintain an indwelling Foley catheter for the next 3 months to allow bladder rest due to the severely distended bladder and bilateral hydronephrosis on recent CT scan. He will follow up with urology on a monthly basis for catheter change and then has a follow up appointment with Dr. Alyson Ingles in 3 months following Urodynamics Study for further evaluation of bladder function.  The catheter has caused some discomfort and pain at the tip of his penis, causing patient to have trouble sleeping at night. He is using Oxybutynin for bladder spasms and topical neosporin for meatal discomfort.  Aside from post-op  urinary retention, he is feeling well overall and denies gross hematuria, fever or chills.  He has not had any further abdominal pain since insertion of the foley catheter and reports that his constipation has resolved.  He does have some rectal pain due to hemorrhoids. He has had decreased appetite and has lost approximately 20 lbs over the past 2-3 months.                      Of note, the patient is s/p CVA in 04/2015 and there is concern that he may have some underlying neurogenic bladder component as a result of the stroke since he was having some difficulty emptying his bladder prior to his procedure.  ALLERGIES:  has No Known Allergies.  Meds: Current Outpatient Prescriptions  Medication Sig Dispense Refill  . acetaminophen (TYLENOL) 650 MG CR tablet Take 650 mg by mouth 3 (three) times daily.    Marland Kitchen amLODipine (NORVASC) 5 MG tablet Take 5 mg by mouth daily.    Marland Kitchen atorvastatin (LIPITOR) 20 MG tablet Take 20 mg by mouth daily.    . clopidogrel (PLAVIX) 75 MG tablet Take 1 tablet (75 mg total) by mouth daily with breakfast. 30 tablet 3  . diltiazem 2 % GEL Apply 1 application topically 2 (two) times daily.    Marland Kitchen donepezil (ARICEPT) 10 MG tablet Take 10 mg by mouth at bedtime.    . hydrocortisone (ANUSOL-HC) 25 MG suppository Place 25 mg rectally 2 (two) times daily.    Marland Kitchen losartan (COZAAR) 25 MG tablet Take 12.5 mg by mouth  daily.    . Melatonin 5 MG TABS Take 5-10 mg by mouth See admin instructions. Give 5mg  at bed time for insomnia.  May give additional 5mg  as needed 1 hour after the first dose if still unable to sleep.    . metFORMIN (GLUCOPHAGE XR) 500 MG 24 hr tablet Take 1 tablet (500 mg total) by mouth 2 (two) times daily. 30 tablet 11  . pantoprazole (PROTONIX) 40 MG tablet Take 40 mg by mouth daily.    . pregabalin (LYRICA) 100 MG capsule Take 1 capsule (100 mg total) by mouth 2 (two) times daily.    . sildenafil (VIAGRA) 50 MG tablet Take 50 mg by mouth daily as needed for erectile  dysfunction.    . tamsulosin (FLOMAX) 0.4 MG CAPS capsule Take 0.4 mg by mouth.    . bisacodyl (DULCOLAX) 10 MG suppository Place 10 mg rectally daily as needed for moderate constipation ((2 of 4)). If not relieved by MOM    . lidocaine (XYLOCAINE) 2 % solution apply 10 ML TO SKIN 3 TIMES DAILY AS NEEDED  0  . magnesium hydroxide (MILK OF MAGNESIA) 400 MG/5ML suspension Take 30 mLs by mouth daily as needed for mild constipation (Constipation Orders (1 of 4)). If no BM in 3 days    . polyethylene glycol (MIRALAX / GLYCOLAX) packet Take 17 g by mouth daily.    . sennosides-docusate sodium (SENOKOT-S) 8.6-50 MG tablet Take 2 tablets by mouth 2 (two) times daily.     . Sodium Phosphates (RA SALINE ENEMA) 19-7 GM/118ML ENEM Place 1 Container rectally daily as needed. If not relieved by Biscodyl suppository then give once in 24 hrs.     No current facility-administered medications for this encounter.     Physical Findings:  weight is 154 lb (69.9 kg). His oral temperature is 98.5 F (36.9 C). His blood pressure is 122/63 and his pulse is 83. His respiration is 16 and oxygen saturation is 100%.   In general this is a well appearing African [place] male in no acute distress. He's alert and oriented x4 and appropriate throughout the examination. Cardiopulmonary assessment is negative for acute distress and he exhibits normal effort.  Foley catheter is in place and draining clear, yellow urine.  Appears to be functioning appropriately.    Lab Findings: Lab Results  Component Value Date   WBC 7.9 08/16/2016   HGB 9.6 (L) 08/16/2016   HCT 29.4 (L) 08/16/2016   PLT 337 08/16/2016    Lab Results  Component Value Date   NA 138 08/16/2016   K 3.5 08/16/2016   CO2 27 08/16/2016   GLUCOSE 171 (H) 08/16/2016   BUN 10 08/16/2016   CREATININE 1.46 (H) 08/16/2016   CREATININE 0.92 08/14/2015   BILITOT 0.5 08/16/2016   ALKPHOS 72 08/16/2016   AST 15 08/16/2016   ALT 12 (L) 08/16/2016   PROT 7.3  08/16/2016   ALBUMIN 3.1 (L) 08/16/2016   CALCIUM 9.1 08/16/2016   ANIONGAP 9 08/16/2016    Radiographic Findings: Ct Abdomen Pelvis Wo Contrast  Result Date: 08/16/2016 CLINICAL DATA:  Severe constipation. EXAM: CT ABDOMEN AND PELVIS WITHOUT CONTRAST TECHNIQUE: Multidetector CT imaging of the abdomen and pelvis was performed following the standard protocol without IV contrast. COMPARISON:  01/04/2016 FINDINGS: Lower chest:  Unremarkable. Hepatobiliary: No focal abnormality in the liver on this study without intravenous contrast. There is no evidence for gallstones, gallbladder wall thickening, or pericholecystic fluid. No intrahepatic or extrahepatic biliary dilation. Pancreas: No focal  mass lesion. No dilatation of the main duct. No intraparenchymal cyst. No peripancreatic edema. Spleen: No splenomegaly. No focal mass lesion. Adrenals/Urinary Tract: No adrenal nodule or mass. Stable appearance 2.3 cm interpolar cyst right kidney. Interval development of mild to moderate bilateral hydroureteronephrosis with bilateral perinephric edema. The urinary bladder is dramatically distended with the dome extending nearly 7 cm cranial to the umbilicus. Bladder measures 24.2 x 14.6 x 15.9 cm with estimated volume as high as 2900 cc. Stomach/Bowel: Stomach is nondistended. No gastric wall thickening. No evidence of outlet obstruction. Duodenum is normally positioned as is the ligament of Treitz. No small bowel wall thickening. No small bowel dilatation. The terminal ileum is normal. The appendix is normal. No gross colonic mass. No substantial diverticular change. Mild circumferential wall thickening noted in the distal rectum near the anus. Vascular/Lymphatic: There is abdominal aortic atherosclerosis without aneurysm. There is no gastrohepatic or hepatoduodenal ligament lymphadenopathy. No intraperitoneal or retroperitoneal lymphadenopathy. No pelvic sidewall lymphadenopathy. Reproductive: Interval placement of break  in therapy seeds in the prostate gland. A new 2.0 x 2.9 cm apparent fluid collection is identified between the prostate gland and the lower rectum. Other: No free intraperitoneal fluid. Body wall edema is noted in the lower pelvis. Musculoskeletal: Bone windows reveal no worrisome lytic or sclerotic osseous lesions. Small right paraumbilical hernia contains only fat. IMPRESSION: 1. Massive bladder distention with mild to moderate bilateral hydroureteronephrosis. Bladder volume calculation is as high as 2900 cc. Bladder outlet obstruction suspected. 2. Interval placement of brachytherapy seeds in the prostate gland. There is also a new 2.0 x 2.9 cm fluid collection between the prostate gland in the lower rectum. This may represent an area of postprocedure fluid or hematoma. Abscess cannot be excluded. 3. Mild circumferential wall thickening in the distal rectum. 4. No substantial stool volume to suggest clinical constipation. 5. Stable right renal cyst. 6.  Aortic Atherosclerois (ICD10-170.0) These results will be called to the ordering clinician or representative by the Radiologist Assistant, and communication documented in the PACS or zVision Dashboard. Electronically Signed   By: Misty Stanley M.D.   On: 08/16/2016 08:52   Dg Abd 1 View  Result Date: 07/31/2016 CLINICAL DATA:  Constipation.  No bowel movements for 1-32 weeks EXAM: ABDOMEN - 1 VIEW COMPARISON:  CT abdomen 01/04/2016 FINDINGS: Large amount of stool in the ascending and transverse colon. There is no bowel dilatation to suggest obstruction. There is no evidence of pneumoperitoneum, portal venous gas or pneumatosis. There are no pathologic calcifications along the expected course of the ureters. Numerous prostatic radiation seeds are noted. The osseous structures are unremarkable. IMPRESSION: Large amount of stool in the ascending and transverse colon. Electronically Signed   By: Kathreen Devoid   On: 07/31/2016 12:46    Impression/ Plan:  Impression/Plan:   1.  62 y.o. gentleman with intermediate Stage T1c adenocarcinoma of the prostate with Gleason's score of 3+4 and PSA of 10.4.  The patient is recovering from the effects radiation. He is reassured that his urinary symptoms should continue to gradually improve over the next 4-6 months.  Unfortunately, he has required insertion of an indwelling foley catheter for treatment of postoperative urinary retention and it is unclear at this time whether this is solely related to prostate swelling caused by recent seed implant procedure or potentially some underlying bladder dysfunction s/p CVA.  He plans to maintain an indwelling Foley catheter for the next 3 months to allow bladder rest due to the severely distended bladder and  bilateral hydronephrosis on recent CT scan. He will follow up with urology on a monthly basis for catheter change and then has a follow up appointment with Dr. Alyson Ingles in 3 months following Urodynamics Study for further evaluation of bladder function.  Pending findings on UDS and/or patient's ability to resume normal bladder function, he may be a good candidate for consideration of a Urolift procedure but that will certainly be at the discretion of his urologist.  We also talked about long-term follow-up of prostate cancer following seed implant. He understands that ongoing PSA determinations and digital rectal exams will help perform surveillance to rule out disease recurrence. He understands what to expect with his PSA measures going forward. He was also educated about some of the long-term effects from radiation including a small risk for rectal bleeding and possible erectile dysfunction. We talked about some of the general management approaches to these potential complications.   His post-implant CT images were reviewed and appear to have excellent coverage.  A copy of the detailed report will be forwarded to his urologist.  He is encouraged to contact our office or return  at any point if he has any questions or concerns related to his previous radiation or prostate cancer. We look forward to continuing to follow his response to treatment through urology correspondence and are happy to see him back as needed. _____________________________________   Nicholos Johns, PA-C    Tyler Pita, MD  [facility] Direct Dial: [phone]  Fax: [phone] [email or web address]  Skype  [email or web address]    This document serves as a record of services personally performed by Tyler Pita MD. It was created on his behalf by Delton Coombes, a trained medical scribe. The creation of this record is based on the scribe's personal observations and the provider's statements to them. This document has been checked and approved by the attending provider.

## 2016-08-29 NOTE — Progress Notes (Signed)
Billy Torres here for post seed implant CT and follow up. He states he was hospitalized in August for constipation and  was found to be in  urinary retention. He currently has a catheter which he may have for the next 3 months. He Is not sure if this is all related to the seed implant or it could be in combination with stroke side effects. He will discuss with Dr.Manning today.

## 2016-08-29 NOTE — Progress Notes (Addendum)
Vitals stable. Approximately 20 lb weight loss noted. Reports rectal pain related to hemorrhoids and at urinary catheter. Patient unable to complete AUA because Monday two weeks ago he had a urinary catheter placed. Patient explains that on July 31 he was admitted to [facility] for management of constipation. Typically patient lives with son and his girlfriend. Reports KUB done at [facility] proved constipation was worse thus patient was directly admitted to the hospital. While hospitalized patient understands that it was the 2-3 liters of urine in his bladder that were presenting him from having a bowel movement thus a catheter was placed. Patient understands he will most likely have to wear a urinary catheter for 3 months. Patient scheduled to follow [facility] at [facility] on Sept 16 aand October 16. Clear yellow urine without blood or sediment noted in urinary collection bag. Patient scheduled for MRI tomorrow night at 9 pm to confirm SpaceOAR placement.  BP 122/63 (BP Location: Right Arm, Patient Position: Sitting, Cuff Size: Normal)   Pulse 83   Temp 98.5 F (36.9 C) (Oral)   Resp 16   Wt 154 lb (69.9 kg)   SpO2 100%   BMI 23.42 kg/m  Wt Readings from Last 3 Encounters:  08/29/16 154 lb (69.9 kg)  08/16/16 171 lb 14.4 oz (78 kg)  06/03/16 160 lb (72.6 kg)

## 2016-08-29 NOTE — Progress Notes (Signed)
  Radiation Oncology         [phone] ________________________________  Name: Billy Torres MRN: [id number]  Date: 08/29/2016  DOB: 1955-03-05  COMPLEX SIMULATION NOTE  NARRATIVE:  The patient was brought to the [facility] today following prostate seed implantation approximately one month ago.  Identity was confirmed.  All relevant records and images related to the planned course of therapy were reviewed.  Then, the patient was set-up supine.  CT images were obtained.  The CT images were loaded into the planning software.  Then the prostate and rectum were contoured.  Treatment planning then occurred.  The implanted iodine 125 seeds were identified by the physics staff for projection of radiation distribution  I have requested : 3D Simulation  I have requested a DVH of the following structures: Prostate and rectum.    ________________________________  Sheral Apley [name], M.D.  This document serves as a record of services personally performed by Tyler Pita MD. It was created on his behalf by Delton Coombes, a trained medical scribe. The creation of this record is based on the scribe's personal observations and the provider's statements to them. This document has been checked and approved by the attending provider.

## 2016-08-30 ENCOUNTER — Encounter: Payer: Self-pay | Admitting: Radiation Oncology

## 2016-08-30 ENCOUNTER — Ambulatory Visit (HOSPITAL_COMMUNITY): Admission: RE | Admit: 2016-08-30 | Payer: Medicare (Managed Care) | Source: Ambulatory Visit

## 2016-08-30 DIAGNOSIS — C61 Malignant neoplasm of prostate: Secondary | ICD-10-CM | POA: Diagnosis not present

## 2016-08-31 NOTE — Progress Notes (Signed)
  Radiation Oncology         [phone] ________________________________  Name: ELIGAH ANELLO MRN: [id number]  Date: 08/30/2016  DOB: 1955-05-03  3D Planning Note   Prostate Brachytherapy Post-Implant Dosimetry  Diagnosis: y.o. gentleman with intermediate Stage T1c adenocarcinoma of the prostate with Gleason's score of 3+4 and PSA of 10.4  Narrative: On a previous date, [facility]THAS[facility]ATHY returned following prostate seed implantation for post implant planning. He underwent CT scan complex simulation to delineate the three-dimensional structures of the pelvis and demonstrate the radiation distribution.  Since that time, the seed localization, and complex isodose planning with dose volume histograms have now been completed.  Results:   Prostate Coverage - The dose of radiation delivered to the 90% or more of the prostate gland (D90) was 117.69% of the prescription dose. This exceeds our goal of greater than 90%. Rectal Sparing - The volume of rectal tissue receiving the prescription dose or higher was 0.0 cc. This falls under our thresholds tolerance of 1.0 cc.  Impression: The prostate seed implant appears to show adequate target coverage and appropriate rectal sparing.  Plan:  The patient will continue to follow with urology for ongoing PSA determinations. I would anticipate a high likelihood for local tumor control with minimal risk for rectal morbidity.  ________________________________  Sheral Apley [name], M.D.

## 2017-06-13 ENCOUNTER — Other Ambulatory Visit: Payer: Self-pay | Admitting: Urology

## 2017-08-06 ENCOUNTER — Encounter (HOSPITAL_COMMUNITY): Payer: Self-pay | Admitting: *Deleted

## 2017-08-06 NOTE — Progress Notes (Signed)
Spoke with [name] ( sonja) and received fax number of [phone] .  [facility] is able to obtain labs and do ekgs for patients.  Called [facility] and [facility] for [employer] to fax over order for labs of CBC,BMP, HGBA1C and ekg to be done on patient.  Order needs to be faxed on 08/06/2017 in order for labs to be done 7/31 or 08/07/2017 since patient is at [facility] on 7/31 and 08/07/2017 .  Sonja to fax current MAR, ADV DIR, POA, ETC to PST.

## 2017-08-07 NOTE — Progress Notes (Addendum)
Preop instructions for: Billy Torres                         Date of Birth  : 10/11/1955                          Date of Procedure:  08/11/2017      Doctor: Dr [name]  Time to arrive at [facility]: 1230pm Report to: Admitting  Procedure: Cystoscopy with insertion of Urolift  Any procedure time changes, MD office will notify you!   Do not eat or drink past midnight the night before your procedure.(To include any tube feedings-must be discontinued)        Take these morning medications only with sips of water.(or give through gastrostomy or feeding tube). Amlodipine ( Norvasc) if takes in am , Protonix ( Pantoprazole) if takes in am, Lyrica ( Pregabalin) if takes in am No Diabetic Medications Am of Surgery     Facility contact:  [facility]                  Phone: [place]:  Transportation contact phone#: [place]   Please send day of procedure:current med list and meds last taken that day, confirm nothing by mouth status from what time, Patient Demographic info( to include DNR status, problem list, allergies)   RN contact name/phone#:                             and Fax #:  Bring Insurance card and picture ID Leave all jewelry and other valuables at place where living( no metal or rings to be worn) No contact lens  lotions,perfumes,powders, deodorant    Any questions day of procedure,call Short Stay [phone]    Sent from :[facility]                   [place]                   [phone]  Sent by :Gillian Shields Rn

## 2017-08-07 NOTE — Progress Notes (Signed)
Received from [facility], cbc bmp and hgba1c done 08/07/2017.  Abnormal labs faxed to Dr [name] with fax confirmation.

## 2017-08-07 NOTE — Progress Notes (Signed)
Spoke with Becky Sax at [facility] and she stated had not yet received order from [facility] for labs and ekg. Called and left [facility] with Noemi Chapel requesting order for Labs and ekg .  Stated order needed to be faxed today asap patient is only there until 230pm.

## 2017-08-08 NOTE — Progress Notes (Signed)
Per Becky Sax at [facility] is bringing patient.  Family is picking up patient.  PEr Becky Sax she has emailed to son the preop instructions and the nurse has reviewed with family also.

## 2017-08-08 NOTE — Progress Notes (Signed)
Billy Torres June 09, 1955  Date of Surgery 08/11/2017 Addendum to preop Instructions :  May have clear liquids from 12 midnite until 0900am morning of surgery then nothing by mouth.       CLEAR LIQUID DIET   Foods Allowed                                                                     Foods Excluded  Coffee and tea, regular and decaf                             liquids that you cannot  Plain Jell-O in any flavor                                             see through such as: Fruit ices (not with fruit pulp)                                     milk, soups, orange juice  Iced Popsicles                                    All solid food Carbonated beverages, regular and diet                                    Cranberry, grape and apple juices Sports drinks like Gatorade Lightly seasoned clear broth or consume(fat free) Sugar, honey syrup  Sample Menu Breakfast                                Lunch                                     Supper Cranberry juice                    Beef broth                            Chicken broth Jell-O                                     Grape juice                           Apple juice Coffee or tea                        Jell-O  Popsicle                                                Coffee or tea                        Coffee or tea  _____________________________________________________________________

## 2017-08-11 ENCOUNTER — Ambulatory Visit (HOSPITAL_COMMUNITY): Payer: Medicaid Other | Admitting: Anesthesiology

## 2017-08-11 ENCOUNTER — Encounter (HOSPITAL_COMMUNITY)
Admission: RE | Disposition: A | Discharge status: Home / Self Care | Payer: Self-pay | Source: Ambulatory Visit | Attending: Urology

## 2017-08-11 ENCOUNTER — Encounter (HOSPITAL_COMMUNITY): Payer: Self-pay | Admitting: *Deleted

## 2017-08-11 ENCOUNTER — Ambulatory Visit (HOSPITAL_COMMUNITY)
Admission: RE | Admit: 2017-08-11 | Discharge: 2017-08-11 | Disposition: A | Discharge status: Home / Self Care | Payer: Medicaid Other | Source: Ambulatory Visit | Attending: Urology | Admitting: Urology

## 2017-08-11 ENCOUNTER — Other Ambulatory Visit: Payer: Self-pay

## 2017-08-11 DIAGNOSIS — E119 Type 2 diabetes mellitus without complications: Secondary | ICD-10-CM | POA: Insufficient documentation

## 2017-08-11 DIAGNOSIS — E785 Hyperlipidemia, unspecified: Secondary | ICD-10-CM | POA: Diagnosis not present

## 2017-08-11 DIAGNOSIS — F17210 Nicotine dependence, cigarettes, uncomplicated: Secondary | ICD-10-CM | POA: Diagnosis not present

## 2017-08-11 DIAGNOSIS — N401 Enlarged prostate with lower urinary tract symptoms: Secondary | ICD-10-CM | POA: Insufficient documentation

## 2017-08-11 DIAGNOSIS — N138 Other obstructive and reflux uropathy: Secondary | ICD-10-CM | POA: Insufficient documentation

## 2017-08-11 DIAGNOSIS — Z79899 Other long term (current) drug therapy: Secondary | ICD-10-CM | POA: Diagnosis not present

## 2017-08-11 DIAGNOSIS — I10 Essential (primary) hypertension: Secondary | ICD-10-CM | POA: Insufficient documentation

## 2017-08-11 DIAGNOSIS — R338 Other retention of urine: Secondary | ICD-10-CM | POA: Insufficient documentation

## 2017-08-11 DIAGNOSIS — Z794 Long term (current) use of insulin: Secondary | ICD-10-CM | POA: Diagnosis not present

## 2017-08-11 DIAGNOSIS — G4700 Insomnia, unspecified: Secondary | ICD-10-CM | POA: Diagnosis not present

## 2017-08-11 HISTORY — DX: Retention of urine, unspecified: R33.9

## 2017-08-11 HISTORY — DX: Presence of urogenital implants: Z96.0

## 2017-08-11 HISTORY — DX: Other cervical disc degeneration, unspecified cervical region: M50.30

## 2017-08-11 HISTORY — DX: Dysphagia, unspecified: R13.10

## 2017-08-11 HISTORY — DX: Chronic kidney disease, unspecified: N18.9

## 2017-08-11 HISTORY — DX: Constipation, unspecified: K59.00

## 2017-08-11 HISTORY — DX: Peripheral vascular disease, unspecified: I73.9

## 2017-08-11 HISTORY — DX: Other hemorrhoids: K64.8

## 2017-08-11 HISTORY — DX: Vesical tenesmus: R30.1

## 2017-08-11 HISTORY — DX: Primary osteoarthritis, left shoulder: M19.012

## 2017-08-11 HISTORY — DX: Other specified disorders of bladder: N32.89

## 2017-08-11 HISTORY — PX: CYSTOSCOPY WITH INSERTION OF UROLIFT: SHX6678

## 2017-08-11 HISTORY — DX: Insomnia, unspecified: G47.00

## 2017-08-11 HISTORY — DX: Generalized hyperhidrosis: R61

## 2017-08-11 LAB — GLUCOSE, CAPILLARY
GLUCOSE-CAPILLARY: 133 mg/dL — AB (ref 70–99)
Glucose-Capillary: 132 mg/dL — ABNORMAL HIGH (ref 70–99)

## 2017-08-11 SURGERY — CYSTOSCOPY WITH INSERTION OF UROLIFT
Anesthesia: General | Site: Bladder

## 2017-08-11 MED ORDER — LIDOCAINE 2% (20 MG/ML) 5 ML SYRINGE
INTRAMUSCULAR | Status: DC | PRN
  Administered 2017-08-11: 100 mg via INTRAVENOUS

## 2017-08-11 MED ORDER — PHENYLEPHRINE 40 MCG/ML (10ML) SYRINGE FOR IV PUSH (FOR BLOOD PRESSURE SUPPORT)
PREFILLED_SYRINGE | INTRAVENOUS | Status: AC
  Filled 2017-08-11: qty 10

## 2017-08-11 MED ORDER — LIDOCAINE 2% (20 MG/ML) 5 ML SYRINGE
INTRAMUSCULAR | Status: AC
  Filled 2017-08-11: qty 5

## 2017-08-11 MED ORDER — OXYCODONE HCL 5 MG PO TABS
5.0000 mg | ORAL_TABLET | Freq: Once | ORAL | Status: AC | PRN
  Administered 2017-08-11: 5 mg via ORAL

## 2017-08-11 MED ORDER — STERILE WATER FOR IRRIGATION IR SOLN
Status: DC | PRN
  Administered 2017-08-11: 3000 mL

## 2017-08-11 MED ORDER — FENTANYL CITRATE (PF) 100 MCG/2ML IJ SOLN
INTRAMUSCULAR | Status: DC | PRN
  Administered 2017-08-11: 100 ug via INTRAVENOUS

## 2017-08-11 MED ORDER — OXYCODONE HCL 5 MG/5ML PO SOLN
5.0000 mg | Freq: Once | ORAL | Status: AC | PRN
  Filled 2017-08-11: qty 5

## 2017-08-11 MED ORDER — CEFAZOLIN SODIUM-DEXTROSE 2-4 GM/100ML-% IV SOLN
2.0000 g | INTRAVENOUS | Status: AC
  Administered 2017-08-11: 2 g via INTRAVENOUS
  Filled 2017-08-11: qty 100

## 2017-08-11 MED ORDER — PHENYLEPHRINE 40 MCG/ML (10ML) SYRINGE FOR IV PUSH (FOR BLOOD PRESSURE SUPPORT)
PREFILLED_SYRINGE | INTRAVENOUS | Status: DC | PRN
  Administered 2017-08-11: 80 ug via INTRAVENOUS

## 2017-08-11 MED ORDER — EPHEDRINE 5 MG/ML INJ
INTRAVENOUS | Status: AC
  Filled 2017-08-11: qty 10

## 2017-08-11 MED ORDER — FENTANYL CITRATE (PF) 100 MCG/2ML IJ SOLN
INTRAMUSCULAR | Status: AC
  Filled 2017-08-11: qty 2

## 2017-08-11 MED ORDER — LACTATED RINGERS IV SOLN
INTRAVENOUS | Status: DC
  Administered 2017-08-11: 1000 mL via INTRAVENOUS

## 2017-08-11 MED ORDER — MEPERIDINE HCL 50 MG/ML IJ SOLN: 6.2500 mg | INTRAMUSCULAR | Status: DC | PRN

## 2017-08-11 MED ORDER — ACETAMINOPHEN 160 MG/5ML PO SOLN: 325.0000 mg | ORAL | Status: DC | PRN

## 2017-08-11 MED ORDER — KETOROLAC TROMETHAMINE 30 MG/ML IJ SOLN: 30.0000 mg | Freq: Once | INTRAMUSCULAR | Status: DC | PRN

## 2017-08-11 MED ORDER — ONDANSETRON HCL 4 MG/2ML IJ SOLN
INTRAMUSCULAR | Status: DC | PRN
  Administered 2017-08-11: 4 mg via INTRAVENOUS

## 2017-08-11 MED ORDER — ONDANSETRON HCL 4 MG/2ML IJ SOLN: 4.0000 mg | Freq: Once | INTRAMUSCULAR | Status: DC | PRN

## 2017-08-11 MED ORDER — EPHEDRINE SULFATE-NACL 50-0.9 MG/10ML-% IV SOSY
PREFILLED_SYRINGE | INTRAVENOUS | Status: DC | PRN
  Administered 2017-08-11: 10 mg via INTRAVENOUS

## 2017-08-11 MED ORDER — ONDANSETRON HCL 4 MG/2ML IJ SOLN
INTRAMUSCULAR | Status: AC
  Filled 2017-08-11: qty 2

## 2017-08-11 MED ORDER — TRAMADOL HCL 50 MG PO TABS: 50.0000 mg | ORAL_TABLET | Freq: Four times a day (QID) | ORAL | 0 refills | Status: AC | PRN

## 2017-08-11 MED ORDER — PROPOFOL 10 MG/ML IV BOLUS
INTRAVENOUS | Status: AC
  Filled 2017-08-11: qty 20

## 2017-08-11 MED ORDER — OXYCODONE HCL 5 MG PO TABS
ORAL_TABLET | ORAL | Status: AC
  Filled 2017-08-11: qty 1

## 2017-08-11 MED ORDER — ACETAMINOPHEN 325 MG PO TABS: 325.0000 mg | ORAL_TABLET | ORAL | Status: DC | PRN

## 2017-08-11 MED ORDER — FENTANYL CITRATE (PF) 100 MCG/2ML IJ SOLN: 25.0000 ug | INTRAMUSCULAR | Status: DC | PRN

## 2017-08-11 MED ORDER — PROPOFOL 10 MG/ML IV BOLUS
INTRAVENOUS | Status: DC | PRN
  Administered 2017-08-11: 150 mg via INTRAVENOUS

## 2017-08-11 SURGICAL SUPPLY — 12 items
BAG URINE DRAINAGE (UROLOGICAL SUPPLIES) ×3 IMPLANT
BAG URINE LEG 500ML (DRAIN) ×2 IMPLANT
BAG URO CATCHER STRL LF (MISCELLANEOUS) ×3 IMPLANT
CATH FOLEY 2WAY SLVR  5CC 16FR (CATHETERS) ×2
CATH FOLEY 2WAY SLVR 5CC 16FR (CATHETERS) ×1 IMPLANT
GLOVE BIO SURGEON STRL SZ8 (GLOVE) ×3 IMPLANT
GOWN STRL REUS W/TWL XL LVL3 (GOWN DISPOSABLE) ×6 IMPLANT
MANIFOLD NEPTUNE II (INSTRUMENTS) ×3 IMPLANT
PACK CYSTO (CUSTOM PROCEDURE TRAY) ×3 IMPLANT
SYSTEM UROLIFT (Male Continence) ×12 IMPLANT
TUBING CONNECTING 10 (TUBING) ×2 IMPLANT
TUBING CONNECTING 10' (TUBING) ×1

## 2017-08-11 NOTE — Anesthesia Postprocedure Evaluation (Signed)
Anesthesia Post Note  Patient: Billy Torres  Procedure(s) Performed: CYSTOSCOPY WITH INSERTION OF UROLIFT (N/A Bladder)     Patient location during evaluation: PACU Anesthesia Type: General Level of consciousness: awake and alert Pain management: pain level controlled Vital Signs Assessment: post-procedure vital signs reviewed and stable Respiratory status: spontaneous breathing, nonlabored ventilation, respiratory function stable and patient connected to nasal cannula oxygen Cardiovascular status: blood pressure returned to baseline and stable Postop Assessment: no apparent nausea or vomiting Anesthetic complications: no    Last Vitals:  Vitals:   08/11/17 1615 08/11/17 1630  BP: 130/83 (!) 140/91  Pulse: 83 87  Resp: 12 20  Temp:  36.4 C  SpO2: 99% 98%    Last Pain:  Vitals:   08/11/17 1630  TempSrc:   PainSc: [place]

## 2017-08-11 NOTE — Discharge Instructions (Signed)
Indwelling Urinary Catheter Care, Adult  Take good care of your catheter to keep it working and to prevent problems.  How to wear your catheter  Attach your catheter to your leg with tape (adhesive tape) or a leg strap. Make sure it is not too tight. If you use tape, remove any bits of tape that are already on the catheter.  How to wear a drainage bag  You should have:   A large overnight bag.   A small leg bag.    Overnight Bag  You may wear the overnight bag at any time. Always keep the bag below the level of your bladder but off the floor. When you sleep, put a clean plastic bag in a wastebasket. Then hang the bag inside the wastebasket.  Leg Bag  Never wear the leg bag at night. Always wear the leg bag below your knee. Keep the leg bag secure with a leg strap or tape.  How to care for your skin   Clean the skin around the catheter at least once every day.   Shower every day. Do not take baths.   Put creams, lotions, or ointments on your genital area only as told by your doctor.   Do not use powders, sprays, or lotions on your genital area.  How to clean your catheter and your skin  1. Wash your hands with soap and water.  2. Wet a washcloth in warm water and gentle (mild) soap.  3. Use the washcloth to clean the skin where the catheter enters your body. Clean downward and wipe away from the catheter in small circles. Do not wipe toward the catheter.  4. Pat the area dry with a clean towel. Make sure to clean off all soap.  How to care for your drainage bags  Empty your drainage bag when it is ?- full or at least 2-3 times a day. Replace your drainage bag once a month or sooner if it starts to smell bad or look dirty. Do not clean your drainage bag unless told by your doctor.  Emptying a drainage bag    Supplies Needed   Rubbing alcohol.   Gauze pad or cotton ball.   Tape or a leg strap.    Steps  1. Wash your hands with soap and water.  2. Separate (detach) the bag from your leg.  3. Hold the bag over  the toilet or a clean container. Keep the bag below your hips and bladder. This stops pee (urine) from going back into the tube.  4. Open the pour spout at the bottom of the bag.  5. Empty the pee into the toilet or container. Do not let the pour spout touch any surface.  6. Put rubbing alcohol on a gauze pad or cotton ball.  7. Use the gauze pad or cotton ball to clean the pour spout.  8. Close the pour spout.  9. Attach the bag to your leg with tape or a leg strap.  10. Wash your hands.    Changing a drainage bag  Supplies Needed   Alcohol wipes.   A clean drainage bag.   Adhesive tape or a leg strap.    Steps  1. Wash your hands with soap and water.  2. Separate the dirty bag from your leg.  3. Pinch the rubber catheter with your fingers so that pee does not spill out.  4. Separate the catheter tube from the drainage tube where these tubes connect (at the   connection valve). Do not let the tubes touch any surface.  5. Clean the end of the catheter tube with an alcohol wipe. Use a different alcohol wipe to clean the end of the drainage tube.  6. Connect the catheter tube to the drainage tube of the clean bag.  7. Attach the new bag to the leg with adhesive tape or a leg strap.  8. Wash your hands.    How to prevent infection and other problems   Never pull on your catheter or try to remove it. Pulling can damage tissue in your body.   Always wash your hands before and after touching your catheter.   If a leg strap gets wet, replace it with a dry one.   Drink enough fluids to keep your pee clear or pale yellow, or as told by your doctor.   Do not let the drainage bag or tubing touch the floor.   Wear cotton underwear.   If you are male, wipe from front to back after you poop (have a bowel movement).   Check on the catheter often to make sure it works and the tubing is not twisted.  Get help if:   Your pee is cloudy.   Your pee smells unusually bad.   Your pee is not draining into the bag.   Your  tube gets clogged.   Your catheter starts to leak.   Your bladder feels full.  Get help right away if:   You have redness, swelling, or pain where the catheter enters your body.   You have fluid, pus, or a bad smell coming from the area where the catheter enters your body.   The area where the catheter enters your body feels warm.   You have a fever.   You have pain in your:  ? Stomach (abdomen).  ? Legs.  ? Lower back.  ? Bladder.   You see blood fill the catheter.   Your pee is pink or red.   You feel sick to your stomach (nauseous).   You throw up (vomit).   You have chills.   Your catheter gets pulled out.  This information is not intended to replace advice given to you by your health care provider. Make sure you discuss any questions you have with your health care provider.  Document Released: 04/20/2012 Document Revised: 11/22/2015 Document Reviewed: 06/08/2013  Elsevier Interactive Patient Education  2018 [employer]

## 2017-08-11 NOTE — Anesthesia Procedure Notes (Signed)
Procedure Name: LMA Insertion Date/Time: 08/11/2017 3:20 PM Performed by: Anne Fu, CRNA Pre-anesthesia Checklist: Patient identified, Emergency Drugs available, Suction available, Patient being monitored and Timeout performed Patient Re-evaluated:Patient Re-evaluated prior to induction Oxygen Delivery Method: Circle system utilized Preoxygenation: Pre-oxygenation with 100% oxygen Induction Type: IV induction Ventilation: Mask ventilation without difficulty LMA: LMA inserted LMA Size: 4.0 Number of attempts: 1 Placement Confirmation: positive ETCO2 and breath sounds checked- equal and bilateral Tube secured with: Tape

## 2017-08-11 NOTE — Transfer of Care (Signed)
Immediate Anesthesia Transfer of Care Note  Patient: Billy Torres  Procedure(s) Performed: Procedure(s): CYSTOSCOPY WITH INSERTION OF UROLIFT (N/A)  Patient Location: PACU  Anesthesia Type:General  Level of Consciousness:  sedated, patient cooperative and responds to stimulation  Airway & Oxygen Therapy:Patient Spontanous Breathing and Patient connected to face mask oxgen  Post-op Assessment:  Report given to PACU RN and Post -op Vital signs reviewed and stable  Post vital signs:  Reviewed and stable  Last Vitals:  Vitals:   08/11/17 1437  BP: 123/75  Pulse: 77  Resp: 18  Temp: 37.5 C  SpO2: 50%    Complications: No apparent anesthesia complications

## 2017-08-11 NOTE — H&P (Signed)
Urology Admission H&P  Chief Complaint: urinary retention  History of Present Illness: [name] is a 62yo with a hx of prostate cancer who underwent brachytherapy and developed urinary retention after therapy. He underwent Urolift and with strenuous activity 2 of his implants dislodged. He devloped recurrent retention. Which is managed with an indwelling foley  Past Medical History:  Diagnosis Date  . Abnormal rate of speech    post stroke  . Anticoagulant long-term use    plavix  . Arthritis of left shoulder region   . At high risk for falls   . Chronic constipation   . Chronic kidney disease    chronic kidney disease   . Cognitive deficit due to old cerebral infarction   . Constipation   . DDD (degenerative disc disease), cervical   . Degenerative cervical spinal stenosis   . Dysphagia   . Frequency of urination   . Gait disturbance, post-stroke    uses cane  . History of ischemic left MCA stroke 06/27/2013   w/ evidence hemorrhagia, high grade stenosis bilateral proximal MCA and extensive small matter disease--- symptoms resolved  . History of ischemic stroke without residual deficits 05-14-2015---  residual left hemiparesis and cognitive deficits   small nonhemorrhegic infarct right corona radiata posterior limb and progressive right m2 stenosis  . Hyperlipidemia   . Hypertension   . Indwelling urethral catheter present   . Insomnia   . Internal hemorrhoids   . Left hemiparesis (HCC)    RESIDUAL FROM STROKE--  WALKS W/ CANE  . Mild atherosclerosis of carotid artery, right    RICA 0-39% per duplex 06-28-2013  . Night sweats   . OA (osteoarthritis)    LEFT SHOULDER  . Painful bladder spasm   . Peripheral arterial disease ([place])   . Prostate cancer [facility]) urologist-  dr Gaynelle Arabian  oncologist-  dr [name]   dx 12/ 2017-- Stage T1c, Gleason 3+4,  PSA 10.4,  vol 51cc  . Sensorineural hearing loss, bilateral   . Type 2 diabetes mellitus treated with insulin ([place])   .  Urge incontinence    wears depends  . Urinary retention   . Wears dentures    Past Surgical History:  Procedure Laterality Date  . ANTERIOR CERVICAL DECOMP/DISCECTOMY FUSION  11/27/1999   C5 -- C6  . CIRCUMCISION  10/07/2001  . FOOT SURGERY  2005  . PROSTATE BIOPSY  12/12/2015  . RADIOACTIVE SEED IMPLANT N/A 05/30/2016   Procedure: RADIOACTIVE SEED IMPLANT/BRACHYTHERAPY IMPLANT WITH SPACE OAR;  Surgeon: Carolan Clines, MD;  Location: [facility];  Service: Urology;  Laterality: N/A;  . SHOULDER ARTHROSCOPY WITH SUBACROMIAL DECOMPRESSION Left 11/28/2006   Debridement/ Acromioplasty/  CA ligament relesase  . TRANSTHORACIC ECHOCARDIOGRAM  05/15/2015   mild LVH,  ef 65-70%, codominant mitral inflow-iindeterminate diastolic function/      Home Medications:  Current Facility-Administered Medications  Medication Dose Route Frequency Provider Last Rate Last Dose  . ceFAZolin (ANCEF) IVPB 2g/100 mL premix  2 g Intravenous 30 min Pre-Op Beth Goodlin, Candee Furbish, MD       Allergies: No Known Allergies  Family History  Problem Relation Age of Onset  . Hypertension Mother   . Hypertension Father   . Cancer Brother        prostate   Social History:  reports that he has been smoking cigarettes.  He has a 11.25 pack-year smoking history. He has never used smokeless tobacco. He reports that he does not drink alcohol or use drugs.  Review of Systems  All other systems reviewed and are negative.   Physical Exam:  Vital signs in last 24 hours: BP: ()/()  Arterial Line BP: ()/()  Physical Exam  Constitutional: He is oriented to person, place, and time. He appears well-developed and well-nourished.  HENT:  Head: Normocephalic and atraumatic.  Eyes: Pupils are equal, round, and reactive to light. EOM are normal.  Neck: Normal range of motion. No thyromegaly present.  Cardiovascular: Normal rate and regular rhythm.  Respiratory: Effort normal. No respiratory distress.  GI:  Soft. He exhibits no distension.  Musculoskeletal: Normal range of motion. He exhibits no edema.  Neurological: He is alert and oriented to person, place, and time.  Skin: Skin is warm and dry.  Psychiatric: He has a normal mood and affect. His behavior is normal. Judgment and thought content normal.    Laboratory Data:  No results found for this or any previous visit (from the past 24 hour(s)). No results found for this or any previous visit (from the past 240 hour(s)). Creatinine: No results for input(s): CREATININE in the last 168 hours. Baseline Creatinine: unknown  Impression/Assessment:  62yo with BPH with Urinary retetnion  Plan:  The risks/benefits/alternatives to Urolift was explained to the patient and he understands and wishes to proceed with therapy.  [name] 08/11/2017, 2:35 PM

## 2017-08-11 NOTE — Op Note (Signed)
   PREOPERATIVE DIAGNOSIS: Benign prostatic hypertrophy with bladder outlet obstruction and urinary retention  POSTOPERATIVE DIAGNOSIS: Benign prostatic hypertrophy with bladder outlet obstruction and urinary retention  PROCEDURE: Cystoscopy with implantation of UroLift devices, 4 implants.  SURGEON: [name], M.D.  ANESTHESIA: General  ANTIBIOTICS: ancef  SPECIMEN: None.  DRAINS: A 16-French Foley catheter.  BLOOD LOSS: Minimal.  COMPLICATIONS: None.  INDICATIONS:The Patient is an 62 year old white male with BPH and bladder outlet obstruction and urinary retention. He underwent previous urolift and dislodged 2 implants. He has failed medical therapy and has elected UroLift for definitive treatment.  FINDINGS OF PROCEDURE: He was taken to the operating room where a genral anesthetic was induced. He was placed in lithotomy position and was fitted with PAS hose. His perineum and genitalia were prepped with chlorhexidine, and he was draped in usual sterile fashion.  Cystoscopy was performed using the UroLift scope and 0 degree lens. Examination revealed a normal urethra. The external sphincter was intact. Prostatic urethra was approximately 5 cm in length with lateral lobe enlargement. There was also little bit of bladder neck elevation. Inspection of bladder revealed mild-to-moderate trabeculation with no tumors, stones, or inflammation. No cellules or diverticula were noted. Ureteral orifices were in their normal anatomic position effluxing clear urine.  After initial cystoscopy, the visual obturator was replaced with the first UroLift device. This was turned to the 9 o'clock position and pulled back to the veru and then slightly advanced. Pressure was then applied to the right lateral lobe and the UroLift device was deployed.  The second UroLift device was then inserted and applied to the left lateral lobe at 3 o'clock and deployed in the  mid prostatic urethra. After this, there was still some apparent obstruction closer to the bladder neck. So a second level of UroLift your left device was applied between the mid urethra and the proximal urethra providing further patency to the prostatic urethra. At this point, there was mild bleeding but the patient did have a spinal anesthetic. So it was thought that a Foley catheter was indicated. The scope was removed and a 16-French Foley catheter was inserted without difficulty. The balloon was filled with 10 mL sterile fluid, and the catheter was placed to straight drainage.  COMPLICATIONS: None   CONDITION: Stable, extubated, transferred to PACU  PLAN: The patient will be discharged home and followup in 2 days for a voiding trial.

## 2017-08-11 NOTE — Anesthesia Preprocedure Evaluation (Signed)
Anesthesia Evaluation  Patient identified by MRN, date of birth, ID band Patient awake    Reviewed: Allergy & Precautions, NPO status , Patient's Chart, lab work & pertinent test results  Airway Mallampati: III  TM Distance: >3 FB Neck ROM: Full    Dental  (+) Edentulous Upper, Dental Advisory Given   Pulmonary Current Smoker,    Pulmonary exam normal breath sounds clear to auscultation       Cardiovascular hypertension, Pt. on medications Normal cardiovascular exam Rhythm:Regular Rate:Normal  ECG: NSR, rate 62 ECHO: - LVEF 65-70%, mild LVH, normal wall motion, indeterminate   diastolic function, normal LA size, normal IVC.   Neuro/Psych Left sided weakness, uses cane to ambulate TIACVA (left sided weakness), Residual Symptoms negative psych ROS   GI/Hepatic negative GI ROS, Neg liver ROS,   Endo/Other  diabetes, Well Controlled, Type 2, Oral Hypoglycemic Agents  Renal/GU negative Renal ROS  negative genitourinary   Musculoskeletal  (+) Arthritis , Osteoarthritis,    Abdominal Normal abdominal exam  (+)   Peds negative pediatric ROS (+)  Hematology negative hematology ROS (+)   Anesthesia Other Findings   Reproductive/Obstetrics negative OB ROS                             Anesthesia Physical  Anesthesia Plan  ASA: III  Anesthesia Plan: General   Post-op Pain Management:    Induction: Intravenous  PONV Risk Score and Plan:   Airway Management Planned: LMA  Additional Equipment:   Intra-op Plan:   Post-operative Plan:   Informed Consent: I have reviewed the patients History and Physical, chart, labs and discussed the procedure including the risks, benefits and alternatives for the proposed anesthesia with the patient or authorized representative who has indicated his/her understanding and acceptance.   Dental advisory given  Plan Discussed with: CRNA and  Surgeon  Anesthesia Plan Comments:         Anesthesia Quick Evaluation

## 2017-08-12 ENCOUNTER — Encounter (HOSPITAL_COMMUNITY): Payer: Self-pay | Admitting: Urology

## 2017-12-15 ENCOUNTER — Ambulatory Visit
Admission: RE | Admit: 2017-12-15 | Discharge: 2017-12-15 | Disposition: A | Discharge status: Home / Self Care | Payer: Medicare (Managed Care) | Source: Ambulatory Visit | Attending: Nurse Practitioner | Admitting: Nurse Practitioner

## 2017-12-15 ENCOUNTER — Other Ambulatory Visit: Payer: Self-pay | Admitting: Nurse Practitioner

## 2017-12-15 DIAGNOSIS — R0602 Shortness of breath: Secondary | ICD-10-CM

## 2018-01-27 ENCOUNTER — Other Ambulatory Visit: Payer: Self-pay | Admitting: Nurse Practitioner

## 2018-01-27 ENCOUNTER — Ambulatory Visit
Admission: RE | Admit: 2018-01-27 | Discharge: 2018-01-27 | Disposition: A | Discharge status: Home / Self Care | Payer: Medicare (Managed Care) | Source: Ambulatory Visit | Attending: Nurse Practitioner | Admitting: Nurse Practitioner

## 2018-01-27 DIAGNOSIS — M79642 Pain in left hand: Secondary | ICD-10-CM

## 2018-02-25 ENCOUNTER — Encounter: Payer: Self-pay | Admitting: Vascular Surgery

## 2018-02-25 ENCOUNTER — Ambulatory Visit (INDEPENDENT_AMBULATORY_CARE_PROVIDER_SITE_OTHER): Payer: Medicare (Managed Care) | Admitting: Vascular Surgery

## 2018-02-25 VITALS — BP 127/76 | HR 87 | Temp 97.7°F | Ht 68.0 in | Wt 151.9 lb

## 2018-02-25 DIAGNOSIS — I739 Peripheral vascular disease, unspecified: Secondary | ICD-10-CM

## 2018-02-25 NOTE — Progress Notes (Signed)
REASON FOR CONSULT:    Abnormal ABIs.  The consult is requested by Dr. Dorian Pod  HPI:   Billy Torres is a pleasant 63 y.o. male, who was referred with abnormal ABIs.  I reviewed the records from the referring office.  The patient had a study on 02/11/2018 which showed an ABI on the left of 71% and an ABI of the right of 79%.  The patient has a long list of medical conditions including, but not limited to, hypertension, type 2 diabetes, hyperlipidemia, history of prostate cancer status post radioactive seed implantation, and peripheral vascular disease.  The patient is on aspirin and a statin.  On my history, the patient does describe some cramps in his calves which usually occur at night.  He does not describe any calf thigh or hip claudication.  Get some pain in his foot and it is difficult to determine if he is getting cramps or he has arthritis.  But I do not get any clear history of rest pain.  He denies any history of nonhealing ulcers.  His risk factors for peripheral vascular disease include diabetes, hypertension, hypercholesterolemia.  He denies any history of smoking and denies any family history of premature cardiovascular disease.  He has had 3 previous strokes in the past.  Most recent stroke was in 2017 and was associated with some slight left-sided weakness.  Of note, he did have a carotid duplex scan in May 2017 which showed no significant carotid disease.  Past Medical History:  Diagnosis Date  . Abnormal rate of speech    post stroke  . Anticoagulant long-term use    plavix  . Arthritis of left shoulder region   . At high risk for falls   . Chronic constipation   . Chronic kidney disease    chronic kidney disease   . Cognitive deficit due to old cerebral infarction   . Constipation   . DDD (degenerative disc disease), cervical   . Degenerative cervical spinal stenosis   . Dysphagia   . Frequency of urination   . Gait disturbance, post-stroke    uses cane   . History of ischemic left MCA stroke 06/27/2013   w/ evidence hemorrhagia, high grade stenosis bilateral proximal MCA and extensive small matter disease--- symptoms resolved  . History of ischemic stroke without residual deficits 05-14-2015---  residual left hemiparesis and cognitive deficits   small nonhemorrhegic infarct right corona radiata posterior limb and progressive right m2 stenosis  . Hyperlipidemia   . Hypertension   . Indwelling urethral catheter present   . Insomnia   . Internal hemorrhoids   . Left hemiparesis (HCC)    RESIDUAL FROM STROKE--  WALKS W/ CANE  . Mild atherosclerosis of carotid artery, right    RICA 0-39% per duplex 06-28-2013  . Night sweats   . OA (osteoarthritis)    LEFT SHOULDER  . Painful bladder spasm   . Peripheral arterial disease ([place])   . Prostate cancer [facility]) urologist-  dr Gaynelle Arabian  oncologist-  dr [name]   dx 12/ 2017-- Stage T1c, Gleason 3+4,  PSA 10.4,  vol 51cc  . Sensorineural hearing loss, bilateral   . Type 2 diabetes mellitus treated with insulin ([place])   . Urge incontinence    wears depends  . Urinary retention   . Wears dentures     Family History  Problem Relation Age of Onset  . Hypertension Mother   . Hypertension Father   . Cancer Brother  prostate    SOCIAL HISTORY: Social History   Socioeconomic History  . Marital status: Divorced    Spouse name: Not on file  . Number of children: Not on file  . Years of education: Not on file  . Highest education level: Not on file  Occupational History  . Not on file  Social Needs  . Financial resource strain: Not on file  . Food insecurity:    Worry: Not on file    Inability: Not on file  . Transportation needs:    Medical: Not on file    Non-medical: Not on file  Tobacco Use  . Smoking status: Current Some Day Smoker    Packs/day: 0.25    Years: 45.00    Pack years: 11.25    Types: Cigarettes  . Smokeless tobacco: Never Used  . Tobacco comment:  continues to smoke approx. 1 to 2 cig's per month  Substance and Sexual Activity  . Alcohol use: No  . Drug use: No  . Sexual activity: Yes  Lifestyle  . Physical activity:    Days per week: Not on file    Minutes per session: Not on file  . Stress: Not on file  Relationships  . Social connections:    Talks on phone: Not on file    Gets together: Not on file    Attends religious service: Not on file    Active member of club or organization: Not on file    Attends meetings of clubs or organizations: Not on file    Relationship status: Not on file  . Intimate partner violence:    Fear of current or ex partner: Not on file    Emotionally abused: Not on file    Physically abused: Not on file    Forced sexual activity: Not on file  Other Topics Concern  . Not on file  Social History Narrative  . Not on file    No Known Allergies  Current Outpatient Medications  Medication Sig Dispense Refill  . acetaminophen (TYLENOL) 650 MG CR tablet Take 650 mg by mouth 3 (three) times daily.    Marland Kitchen amLODipine (NORVASC) 5 MG tablet Take 5 mg by mouth daily.    Marland Kitchen atorvastatin (LIPITOR) 40 MG tablet Take 40 mg by mouth daily.     . Cholecalciferol (VITAMIN D3) 1000 units CAPS Take 1,000 Units by mouth daily.    Marland Kitchen donepezil (ARICEPT) 10 MG tablet Take 10 mg by mouth at bedtime.    Marland Kitchen glipiZIDE (GLUCOTROL XL) 5 MG 24 hr tablet Take 5 mg by mouth daily with breakfast.    . hydrocortisone (ANUSOL-HC) 2.5 % rectal cream Place 1 application rectally 2 (two) times daily as needed for hemorrhoids or anal itching.    . losartan (COZAAR) 50 MG tablet Take 50 mg by mouth daily.     . Melatonin 5 MG TABS Take 5 mg by mouth daily with supper.     . Menthol, Topical Analgesic, (BIOFREEZE EX) Apply 1 application topically 2 (two) times daily.    . metFORMIN (GLUCOPHAGE) 1000 MG tablet Take 1,000 mg by mouth 2 (two) times daily with a meal.    . pantoprazole (PROTONIX) 20 MG tablet Take 20 mg by mouth daily.       . polyethylene glycol (MIRALAX / GLYCOLAX) packet Take 17 g by mouth daily as needed for moderate constipation.     . pregabalin (LYRICA) 100 MG capsule Take 1 capsule (100 mg total) by mouth 2 (  two) times daily.    . sennosides-docusate sodium (SENOKOT-S) 8.6-50 MG tablet Take 1 tablet by mouth at bedtime.     . simethicone (MYLICON) 914 MG chewable tablet Chew 125 mg by mouth 4 (four) times daily as needed for flatulence.    . traMADol (ULTRAM) 50 MG tablet Take 1 tablet (50 mg total) by mouth every 6 (six) hours as needed. 30 tablet 0  . clopidogrel (PLAVIX) 75 MG tablet Take 1 tablet (75 mg total) by mouth daily with breakfast. (Patient not taking: Reported on 08/06/2017) 30 tablet 3   No current facility-administered medications for this visit.     REVIEW OF SYSTEMS:  [X]  denotes positive finding, [ ]  denotes negative finding Cardiac  Comments:  Chest pain or chest pressure:    Shortness of breath upon exertion:    Short of breath when lying flat:    Irregular heart rhythm:        Vascular    Pain in calf, thigh, or hip brought on by ambulation:    Pain in feet at night that wakes you up from your sleep:     Blood clot in your veins:    Leg swelling:         Pulmonary    Oxygen at home:    Productive cough:     Wheezing:         Neurologic    Sudden weakness in arms or legs:     Sudden numbness in arms or legs:     Sudden onset of difficulty speaking or slurred speech:    Temporary loss of vision in one eye:     Problems with dizziness:         Gastrointestinal    Blood in stool:     Vomited blood:         Genitourinary    Burning when urinating:     Blood in urine:        Psychiatric    Major depression:         Hematologic    Bleeding problems:    Problems with blood clotting too easily:        Skin    Rashes or ulcers:        Constitutional    Fever or chills:     PHYSICAL EXAM:   Vitals:   02/25/18 1409  BP: 127/76  Pulse: 87  Temp: 97.7 F  (36.5 C)  TempSrc: Oral  SpO2: 99%  Weight: 151 lb 14.4 oz (68.9 kg)  Height: 5\' 8"  (1.727 m)    GENERAL: The patient is a well-nourished male, in no acute distress. The vital signs are documented above. CARDIAC: There is a regular rate and rhythm.  VASCULAR: I do not detect carotid bruits. He has palpable femoral pulses.  I cannot palpate pedal pulses. He does have hyperpigmentation bilaterally consistent with chronic venous insufficiency. On the right side he has a fairly brisk dorsalis pedis, posterior tibial, and peroneal signal with the Doppler. On the left side, he has a fairly brisk posterior tibial signal and peroneal signal.  I cannot get a dorsalis pedis signal on the left. PULMONARY: There is good air exchange bilaterally without wheezing or rales. ABDOMEN: Soft and non-tender with normal pitched bowel sounds.  I do not palpate an abdominal aortic aneurysm. MUSCULOSKELETAL: There are no major deformities or cyanosis. NEUROLOGIC: He has some very minimal weakness in the left upper extremity. SKIN: There are no ulcers or rashes noted. PSYCHIATRIC:  The patient has a normal affect.  DATA:    DOPPLER STUDY: I did review the Doppler study that was done at the outside office.  This shows an ABI of 71% on the left and 79% on the right.   ASSESSMENT & PLAN:   PERIPHERAL VASCULAR DISEASE: Based on his physical exam and noninvasive study he does appear to have evidence of infrainguinal arterial occlusive disease bilaterally.  However he is essentially asymptomatic.  I do not get any history of claudication or rest pain.  He does have some cramps in his legs.  He is not a smoker.  I have encouraged him to stay as active as possible.  We also discussed the importance of nutrition.  I have offered to see him back in a year so we can continue to follow this closely however he would prefer to simply have Dr. Jimmye Norman call if his symptoms progress.  I think this is perfectly  reasonable.  CHRONIC VENOUS INSUFFICIENCY: The patient does have CEAP C4 venous disease.  However he really does not have any significant symptoms related to this.  Given that he has some infrainguinal arterial occlusive disease I would not recommend aggressive elevation and compression for his venous insufficiency.   Deitra Mayo [facility] [phone]

## 2018-11-05 ENCOUNTER — Ambulatory Visit
Admission: RE | Admit: 2018-11-05 | Discharge: 2018-11-05 | Disposition: A | Discharge status: Home / Self Care | Payer: Medicare (Managed Care) | Source: Ambulatory Visit | Attending: Internal Medicine | Admitting: Internal Medicine

## 2018-11-05 ENCOUNTER — Other Ambulatory Visit: Payer: Self-pay | Admitting: Internal Medicine

## 2018-11-05 DIAGNOSIS — L03818 Cellulitis of other sites: Secondary | ICD-10-CM

## 2018-11-06 ENCOUNTER — Telehealth (HOSPITAL_COMMUNITY): Payer: Self-pay | Admitting: *Deleted

## 2018-11-06 NOTE — Telephone Encounter (Signed)
Attempted to reach patient to schedule ABI. LV on mobile.

## 2018-11-09 ENCOUNTER — Telehealth (HOSPITAL_COMMUNITY): Payer: Self-pay | Admitting: *Deleted

## 2018-11-09 NOTE — Telephone Encounter (Signed)
2nd attempt to reach pt to schedule ABI before appt with [facility] 12/01/2018

## 2018-11-10 ENCOUNTER — Telehealth (HOSPITAL_COMMUNITY): Payer: Self-pay | Admitting: *Deleted

## 2018-11-10 NOTE — Telephone Encounter (Signed)
Left VM to inform pt that he is scheduled to have ABIs performed 12/01/2018 at 1:00 and to see Dr. Donnetta Hutching at 2:00. Several attempts were made to make an appointment with the patient, no calls were ever returned. Informed Lyndsay at [place].

## 2018-11-12 ENCOUNTER — Other Ambulatory Visit: Payer: Self-pay | Admitting: Internal Medicine

## 2018-11-12 DIAGNOSIS — L97529 Non-pressure chronic ulcer of other part of left foot with unspecified severity: Secondary | ICD-10-CM

## 2018-11-26 ENCOUNTER — Other Ambulatory Visit: Payer: Self-pay

## 2018-11-26 DIAGNOSIS — I739 Peripheral vascular disease, unspecified: Secondary | ICD-10-CM

## 2018-12-01 ENCOUNTER — Other Ambulatory Visit: Payer: Self-pay

## 2018-12-01 ENCOUNTER — Encounter: Payer: Self-pay | Admitting: *Deleted

## 2018-12-01 ENCOUNTER — Ambulatory Visit (HOSPITAL_COMMUNITY)
Admission: RE | Admit: 2018-12-01 | Discharge: 2018-12-01 | Disposition: A | Discharge status: Home / Self Care | Payer: Medicare (Managed Care) | Source: Ambulatory Visit | Attending: Vascular Surgery | Admitting: Vascular Surgery

## 2018-12-01 ENCOUNTER — Ambulatory Visit (INDEPENDENT_AMBULATORY_CARE_PROVIDER_SITE_OTHER): Payer: Medicare (Managed Care) | Admitting: Vascular Surgery

## 2018-12-01 ENCOUNTER — Other Ambulatory Visit: Payer: Self-pay | Admitting: *Deleted

## 2018-12-01 ENCOUNTER — Encounter: Payer: Self-pay | Admitting: Vascular Surgery

## 2018-12-01 VITALS — BP 128/75 | HR 80 | Temp 97.8°F | Resp 20 | Ht 68.0 in | Wt 158.0 lb

## 2018-12-01 DIAGNOSIS — I739 Peripheral vascular disease, unspecified: Secondary | ICD-10-CM | POA: Diagnosis not present

## 2018-12-01 NOTE — H&P (View-Only) (Signed)
[facility] of [place]  Patient name: Billy Torres MRN: [id number] DOB: 12/10/1955 Sex: male  REASON FOR VISIT: Evaluation nonhealing lesion dorsum left foot  HPI: Billy Torres is a 63 y.o. male here for further evaluation of peripheral vascular occlusive disease.  He initially saw Dr. Scot Dock in February 2020.  At that time he was not walking to the level of claudication and did not have any lower extremity tissue loss.  Ankle arm index was 0.7 bilaterally and he was instructed to return should he have any progression.  He now presents with a several week history of ulceration on the dorsum of his left foot.  This is at the base of his third and fourth toe.  It is not responded to antibiotics.  Past Medical History:  Diagnosis Date  . Abnormal rate of speech    post stroke  . Anticoagulant long-term use    plavix  . Arthritis of left shoulder region   . At high risk for falls   . Chronic constipation   . Chronic kidney disease    chronic kidney disease   . Cognitive deficit due to old cerebral infarction   . Constipation   . DDD (degenerative disc disease), cervical   . Degenerative cervical spinal stenosis   . Dysphagia   . Frequency of urination   . Gait disturbance, post-stroke    uses cane  . History of ischemic left MCA stroke 06/27/2013   w/ evidence hemorrhagia, high grade stenosis bilateral proximal MCA and extensive small matter disease--- symptoms resolved  . History of ischemic stroke without residual deficits 05-14-2015---  residual left hemiparesis and cognitive deficits   small nonhemorrhegic infarct right corona radiata posterior limb and progressive right m2 stenosis  . Hyperlipidemia   . Hypertension   . Indwelling urethral catheter present   . Insomnia   . Internal hemorrhoids   . Left hemiparesis (HCC)    RESIDUAL FROM STROKE--  WALKS W/ CANE  . Mild atherosclerosis of carotid artery, right     RICA 0-39% per duplex 06-28-2013  . Night sweats   . OA (osteoarthritis)    LEFT SHOULDER  . Painful bladder spasm   . Peripheral arterial disease ([place])   . Prostate cancer [facility]) urologist-  dr Gaynelle Arabian  oncologist-  dr [name]   dx 12/ 2017-- Stage T1c, Gleason 3+4,  PSA 10.4,  vol 51cc  . Sensorineural hearing loss, bilateral   . Type 2 diabetes mellitus treated with insulin ([place])   . Urge incontinence    wears depends  . Urinary retention   . Wears dentures     Family History  Problem Relation Age of Onset  . Hypertension Mother   . Hypertension Father   . Cancer Brother        prostate    SOCIAL HISTORY: Social History   Tobacco Use  . Smoking status: Current Some Day Smoker    Packs/day: 0.25    Years: 45.00    Pack years: 11.25    Types: Cigarettes  . Smokeless tobacco: Never Used  . Tobacco comment: continues to smoke approx. 1 to 2 cig's per month  Substance Use Topics  . Alcohol use: No    No Known Allergies  Current Outpatient Medications  Medication Sig Dispense Refill  . acetaminophen (TYLENOL) 650 MG CR tablet Take 650 mg by mouth 3 (three) times daily.    Marland Kitchen amLODipine (NORVASC) 5 MG tablet Take 5 mg by mouth  daily.    . atorvastatin (LIPITOR) 40 MG tablet Take 40 mg by mouth daily.     . Cholecalciferol (VITAMIN D3) 1000 units CAPS Take 1,000 Units by mouth daily.    Marland Kitchen donepezil (ARICEPT) 10 MG tablet Take 10 mg by mouth at bedtime.    Marland Kitchen glipiZIDE (GLUCOTROL XL) 5 MG 24 hr tablet Take 5 mg by mouth daily with breakfast.    . hydrocortisone (ANUSOL-HC) 2.5 % rectal cream Place 1 application rectally 2 (two) times daily as needed for hemorrhoids or anal itching.    . losartan (COZAAR) 50 MG tablet Take 50 mg by mouth daily.     . Melatonin 5 MG TABS Take 5 mg by mouth daily with supper.     . Menthol, Topical Analgesic, (BIOFREEZE EX) Apply 1 application topically 2 (two) times daily.    . metFORMIN (GLUCOPHAGE) 1000 MG tablet Take 1,000 mg by  mouth 2 (two) times daily with a meal.    . pantoprazole (PROTONIX) 20 MG tablet Take 20 mg by mouth daily.     . polyethylene glycol (MIRALAX / GLYCOLAX) packet Take 17 g by mouth daily as needed for moderate constipation.     . pregabalin (LYRICA) 100 MG capsule Take 1 capsule (100 mg total) by mouth 2 (two) times daily.    . sennosides-docusate sodium (SENOKOT-S) 8.6-50 MG tablet Take 1 tablet by mouth at bedtime.     . simethicone (MYLICON) 0000000 MG chewable tablet Chew 125 mg by mouth 4 (four) times daily as needed for flatulence.     No current facility-administered medications for this visit.     REVIEW OF SYSTEMS:  [X]  denotes positive finding, [ ]  denotes negative finding Cardiac  Comments:  Chest pain or chest pressure:    Shortness of breath upon exertion:    Short of breath when lying flat:    Irregular heart rhythm:        Vascular    Pain in calf, thigh, or hip brought on by ambulation:    Pain in feet at night that wakes you up from your sleep:  x   Blood clot in your veins:    Leg swelling:           PHYSICAL EXAM: Vitals:   12/01/18 1402  BP: 128/75  Pulse: 80  Resp: 20  Temp: 97.8 F (36.6 C)  SpO2: 98%  Weight: 158 lb (71.7 kg)  Height: 5\' 8"  (1.727 m)    GENERAL: The patient is a well-nourished male, in no acute distress. The vital signs are documented above. CARDIOVASCULAR: Palpable radial and femoral pulses.  Absent popliteal and distal pulses PULMONARY: There is good air exchange  MUSCULOSKELETAL: There are no major deformities or cyanosis. NEUROLOGIC: No focal weakness or paresthesias are detected. SKIN: Erythema on the dorsum of his left foot with 1 cm superficial ulceration. PSYCHIATRIC: The patient has a normal affect.  DATA:  Noninvasive studies today show progression with dampened monophasic flow in the posterior tibial and absent flow in the dorsalis pedis with an ankle arm index on the left of 0.4.  On the right the vessels were  noncompressible with dampened monophasic flow  MEDICAL ISSUES: Critical limb ischemia of left foot.  Patient has very poor understanding of his medical issues.  I explained that he will need arteriogram and intervention for his critical limb ischemia.  The last creatinine in the [facility] system was 1.4.  We will obtain arteriogram with possible intervention.  May  need surgical bypass if endovascular treatment is not possible.    Rosetta Posner, MD FACS [facility] Tel [phone] Pager [phone]

## 2018-12-01 NOTE — Progress Notes (Signed)
Call to [facility] left a message for Billy Torres Comment and spoke with Kennyth Lose about 12/11/2018 procedure at [facility] and nasal swab prior. All patient instructions faxed to [facility].

## 2018-12-01 NOTE — Progress Notes (Signed)
[facility] [place]  Patient name: Billy Torres MRN: [id number] DOB: [birth year] Sex: male  REASON FOR VISIT: Evaluation nonhealing lesion dorsum left foot  HPI: Billy Torres is a 63 y.o. male here for further evaluation of peripheral vascular occlusive disease.  He initially saw Dr. Scot Dock in February 2020.  At that time he was not walking to the level of claudication and did not have any lower extremity tissue loss.  Ankle arm index was 0.7 bilaterally and he was instructed to return should he have any progression.  He now presents with a several week history of ulceration on the dorsum of his left foot.  This is at the base of his third and fourth toe.  It is not responded to antibiotics.  Past Medical History:  Diagnosis Date  . Abnormal rate of speech    post stroke  . Anticoagulant long-term use    plavix  . Arthritis of left shoulder region   . At high risk for falls   . Chronic constipation   . Chronic kidney disease    chronic kidney disease   . Cognitive deficit due to old cerebral infarction   . Constipation   . DDD (degenerative disc disease), cervical   . Degenerative cervical spinal stenosis   . Dysphagia   . Frequency of urination   . Gait disturbance, post-stroke    uses cane  . History of ischemic left MCA stroke 06/27/2013   w/ evidence hemorrhagia, high grade stenosis bilateral proximal MCA and extensive small matter disease--- symptoms resolved  . History of ischemic stroke without residual deficits 05-14-2015---  residual left hemiparesis and cognitive deficits   small nonhemorrhegic infarct right corona radiata posterior limb and progressive right m2 stenosis  . Hyperlipidemia   . Hypertension   . Indwelling urethral catheter present   . Insomnia   . Internal hemorrhoids   . Left hemiparesis (HCC)    RESIDUAL FROM STROKE--  WALKS W/ CANE  . Mild atherosclerosis of carotid artery, right     RICA 0-39% per duplex 06-28-2013  . Night sweats   . OA (osteoarthritis)    LEFT SHOULDER  . Painful bladder spasm   . Peripheral arterial disease ([place])   . Prostate cancer [facility]) urologist-  dr Gaynelle Arabian  oncologist-  dr [name]   dx 12/ 2017-- Stage T1c, Gleason 3+4,  PSA 10.4,  vol 51cc  . Sensorineural hearing loss, bilateral   . Type 2 diabetes mellitus treated with insulin ([place])   . Urge incontinence    wears depends  . Urinary retention   . Wears dentures     Family History  Problem Relation Age of Onset  . Hypertension Mother   . Hypertension Father   . Cancer Brother        prostate    SOCIAL HISTORY: Social History   Tobacco Use  . Smoking status: Current Some Day Smoker    Packs/day: 0.25    Years: 45.00    Pack years: 11.25    Types: Cigarettes  . Smokeless tobacco: Never Used  . Tobacco comment: continues to smoke approx. 1 to 2 cig's per month  Substance Use Topics  . Alcohol use: No    No Known Allergies  Current Outpatient Medications  Medication Sig Dispense Refill  . acetaminophen (TYLENOL) 650 MG CR tablet Take 650 mg by mouth 3 (three) times daily.    Marland Kitchen amLODipine (NORVASC) 5 MG tablet Take 5 mg by mouth  daily.    . atorvastatin (LIPITOR) 40 MG tablet Take 40 mg by mouth daily.     . Cholecalciferol (VITAMIN D3) 1000 units CAPS Take 1,000 Units by mouth daily.    Marland Kitchen donepezil (ARICEPT) 10 MG tablet Take 10 mg by mouth at bedtime.    Marland Kitchen glipiZIDE (GLUCOTROL XL) 5 MG 24 hr tablet Take 5 mg by mouth daily with breakfast.    . hydrocortisone (ANUSOL-HC) 2.5 % rectal cream Place 1 application rectally 2 (two) times daily as needed for hemorrhoids or anal itching.    . losartan (COZAAR) 50 MG tablet Take 50 mg by mouth daily.     . Melatonin 5 MG TABS Take 5 mg by mouth daily with supper.     . Menthol, Topical Analgesic, (BIOFREEZE EX) Apply 1 application topically 2 (two) times daily.    . metFORMIN (GLUCOPHAGE) 1000 MG tablet Take 1,000 mg by  mouth 2 (two) times daily with a meal.    . pantoprazole (PROTONIX) 20 MG tablet Take 20 mg by mouth daily.     . polyethylene glycol (MIRALAX / GLYCOLAX) packet Take 17 g by mouth daily as needed for moderate constipation.     . pregabalin (LYRICA) 100 MG capsule Take 1 capsule (100 mg total) by mouth 2 (two) times daily.    . sennosides-docusate sodium (SENOKOT-S) 8.6-50 MG tablet Take 1 tablet by mouth at bedtime.     . simethicone (MYLICON) 0000000 MG chewable tablet Chew 125 mg by mouth 4 (four) times daily as needed for flatulence.     No current facility-administered medications for this visit.     REVIEW OF SYSTEMS:  [X]  denotes positive finding, [ ]  denotes negative finding Cardiac  Comments:  Chest pain or chest pressure:    Shortness of breath upon exertion:    Short of breath when lying flat:    Irregular heart rhythm:        Vascular    Pain in calf, thigh, or hip brought on by ambulation:    Pain in feet at night that wakes you up from your sleep:  x   Blood clot in your veins:    Leg swelling:           PHYSICAL EXAM: Vitals:   12/01/18 1402  BP: 128/75  Pulse: 80  Resp: 20  Temp: 97.8 F (36.6 C)  SpO2: 98%  Weight: 158 lb (71.7 kg)  Height: 5\' 8"  (1.727 m)    GENERAL: The patient is a well-nourished male, in no acute distress. The vital signs are documented above. CARDIOVASCULAR: Palpable radial and femoral pulses.  Absent popliteal and distal pulses PULMONARY: There is good air exchange  MUSCULOSKELETAL: There are no major deformities or cyanosis. NEUROLOGIC: No focal weakness or paresthesias are detected. SKIN: Erythema on the dorsum of his left foot with 1 cm superficial ulceration. PSYCHIATRIC: The patient has a normal affect.  DATA:  Noninvasive studies today show progression with dampened monophasic flow in the posterior tibial and absent flow in the dorsalis pedis with an ankle arm index on the left of 0.4.  On the right the vessels were  noncompressible with dampened monophasic flow  MEDICAL ISSUES: Critical limb ischemia of left foot.  Patient has very poor understanding of his medical issues.  I explained that he will need arteriogram and intervention for his critical limb ischemia.  The last creatinine in the [facility] system was 1.4.  We will obtain arteriogram with possible intervention.  May  need surgical bypass if endovascular treatment is not possible.    Rosetta Posner, MD FACS [facility] Tel [phone] Pager [phone]

## 2018-12-07 ENCOUNTER — Ambulatory Visit
Admission: RE | Admit: 2018-12-07 | Discharge: 2018-12-07 | Disposition: A | Discharge status: Home / Self Care | Payer: Medicare (Managed Care) | Source: Ambulatory Visit | Attending: Internal Medicine | Admitting: Internal Medicine

## 2018-12-07 DIAGNOSIS — L97529 Non-pressure chronic ulcer of other part of left foot with unspecified severity: Secondary | ICD-10-CM

## 2018-12-07 MED ORDER — GADOBENATE DIMEGLUMINE 529 MG/ML IV SOLN
15.0000 mL | Freq: Once | INTRAVENOUS | Status: AC | PRN
  Administered 2018-12-07: 15 mL via INTRAVENOUS

## 2018-12-08 ENCOUNTER — Other Ambulatory Visit (HOSPITAL_COMMUNITY)
Admission: RE | Admit: 2018-12-08 | Discharge: 2018-12-08 | Disposition: A | Discharge status: Home / Self Care | Payer: Medicare (Managed Care) | Source: Ambulatory Visit | Attending: Vascular Surgery | Admitting: Vascular Surgery

## 2018-12-08 DIAGNOSIS — Z20828 Contact with and (suspected) exposure to other viral communicable diseases: Secondary | ICD-10-CM | POA: Diagnosis not present

## 2018-12-08 DIAGNOSIS — Z01812 Encounter for preprocedural laboratory examination: Secondary | ICD-10-CM | POA: Insufficient documentation

## 2018-12-10 LAB — NOVEL CORONAVIRUS, NAA (HOSP ORDER, SEND-OUT TO REF LAB; TAT 18-24 HRS): SARS-CoV-2, NAA: NOT DETECTED

## 2018-12-11 ENCOUNTER — Ambulatory Visit (HOSPITAL_COMMUNITY)
Admission: RE | Admit: 2018-12-11 | Discharge: 2018-12-12 | Disposition: A | Discharge status: Home / Self Care | Payer: Medicare (Managed Care) | Attending: Vascular Surgery | Admitting: Vascular Surgery

## 2018-12-11 ENCOUNTER — Other Ambulatory Visit: Payer: Self-pay

## 2018-12-11 ENCOUNTER — Encounter (HOSPITAL_COMMUNITY)
Admission: RE | Disposition: A | Discharge status: Home / Self Care | Payer: Self-pay | Source: Home / Self Care | Attending: Vascular Surgery

## 2018-12-11 DIAGNOSIS — I6521 Occlusion and stenosis of right carotid artery: Secondary | ICD-10-CM | POA: Insufficient documentation

## 2018-12-11 DIAGNOSIS — E11621 Type 2 diabetes mellitus with foot ulcer: Secondary | ICD-10-CM | POA: Insufficient documentation

## 2018-12-11 DIAGNOSIS — L97529 Non-pressure chronic ulcer of other part of left foot with unspecified severity: Secondary | ICD-10-CM | POA: Diagnosis not present

## 2018-12-11 DIAGNOSIS — Z8546 Personal history of malignant neoplasm of prostate: Secondary | ICD-10-CM | POA: Diagnosis not present

## 2018-12-11 DIAGNOSIS — E1122 Type 2 diabetes mellitus with diabetic chronic kidney disease: Secondary | ICD-10-CM | POA: Diagnosis not present

## 2018-12-11 DIAGNOSIS — Z8673 Personal history of transient ischemic attack (TIA), and cerebral infarction without residual deficits: Secondary | ICD-10-CM | POA: Insufficient documentation

## 2018-12-11 DIAGNOSIS — I129 Hypertensive chronic kidney disease with stage 1 through stage 4 chronic kidney disease, or unspecified chronic kidney disease: Secondary | ICD-10-CM | POA: Diagnosis not present

## 2018-12-11 DIAGNOSIS — F17210 Nicotine dependence, cigarettes, uncomplicated: Secondary | ICD-10-CM | POA: Insufficient documentation

## 2018-12-11 DIAGNOSIS — Z794 Long term (current) use of insulin: Secondary | ICD-10-CM | POA: Diagnosis not present

## 2018-12-11 DIAGNOSIS — Z7901 Long term (current) use of anticoagulants: Secondary | ICD-10-CM | POA: Insufficient documentation

## 2018-12-11 DIAGNOSIS — I70209 Unspecified atherosclerosis of native arteries of extremities, unspecified extremity: Secondary | ICD-10-CM | POA: Diagnosis present

## 2018-12-11 DIAGNOSIS — E785 Hyperlipidemia, unspecified: Secondary | ICD-10-CM | POA: Diagnosis not present

## 2018-12-11 DIAGNOSIS — N189 Chronic kidney disease, unspecified: Secondary | ICD-10-CM | POA: Insufficient documentation

## 2018-12-11 DIAGNOSIS — I70245 Atherosclerosis of native arteries of left leg with ulceration of other part of foot: Secondary | ICD-10-CM | POA: Insufficient documentation

## 2018-12-11 DIAGNOSIS — E1151 Type 2 diabetes mellitus with diabetic peripheral angiopathy without gangrene: Secondary | ICD-10-CM | POA: Insufficient documentation

## 2018-12-11 DIAGNOSIS — Z79899 Other long term (current) drug therapy: Secondary | ICD-10-CM | POA: Diagnosis not present

## 2018-12-11 DIAGNOSIS — I739 Peripheral vascular disease, unspecified: Secondary | ICD-10-CM | POA: Diagnosis present

## 2018-12-11 HISTORY — PX: ABDOMINAL AORTOGRAM W/LOWER EXTREMITY: CATH118223

## 2018-12-11 HISTORY — PX: PERIPHERAL VASCULAR BALLOON ANGIOPLASTY: CATH118281

## 2018-12-11 LAB — POCT I-STAT, CHEM 8
BUN: 14 mg/dL (ref 8–23)
Calcium, Ion: 0.93 mmol/L — ABNORMAL LOW (ref 1.15–1.40)
Chloride: 108 mmol/L (ref 98–111)
Creatinine, Ser: 0.9 mg/dL (ref 0.61–1.24)
Glucose, Bld: 112 mg/dL — ABNORMAL HIGH (ref 70–99)
HCT: 40 % (ref 39.0–52.0)
Hemoglobin: 13.6 g/dL (ref 13.0–17.0)
Potassium: 5.4 mmol/L — ABNORMAL HIGH (ref 3.5–5.1)
Sodium: 135 mmol/L (ref 135–145)
TCO2: 23 mmol/L (ref 22–32)

## 2018-12-11 LAB — POCT ACTIVATED CLOTTING TIME
Activated Clotting Time: 252 seconds
Activated Clotting Time: 235 seconds
Activated Clotting Time: 197 seconds
Activated Clotting Time: 180 seconds

## 2018-12-11 LAB — GLUCOSE, CAPILLARY
Glucose-Capillary: 123 mg/dL — ABNORMAL HIGH (ref 70–99)
Glucose-Capillary: 93 mg/dL (ref 70–99)
Glucose-Capillary: 114 mg/dL — ABNORMAL HIGH (ref 70–99)

## 2018-12-11 SURGERY — ABDOMINAL AORTOGRAM W/LOWER EXTREMITY
Anesthesia: LOCAL

## 2018-12-11 MED ORDER — HEPARIN (PORCINE) IN NACL 1000-0.9 UT/500ML-% IV SOLN
INTRAVENOUS | Status: DC | PRN
  Administered 2018-12-11: 500 mL

## 2018-12-11 MED ORDER — FENTANYL CITRATE (PF) 100 MCG/2ML IJ SOLN
INTRAMUSCULAR | Status: AC
  Filled 2018-12-11: qty 2

## 2018-12-11 MED ORDER — TRAZODONE HCL 100 MG PO TABS
100.0000 mg | ORAL_TABLET | Freq: Every day | ORAL | Status: DC
  Administered 2018-12-11: 100 mg via ORAL
  Filled 2018-12-11: qty 1

## 2018-12-11 MED ORDER — CLOPIDOGREL BISULFATE 300 MG PO TABS
ORAL_TABLET | ORAL | Status: DC | PRN
  Administered 2018-12-11: 300 mg via ORAL

## 2018-12-11 MED ORDER — ACETAMINOPHEN 325 MG PO TABS: 650.0000 mg | ORAL_TABLET | ORAL | Status: DC | PRN

## 2018-12-11 MED ORDER — LORATADINE 10 MG PO TABS
10.0000 mg | ORAL_TABLET | Freq: Every day | ORAL | Status: DC
  Administered 2018-12-11 – 2018-12-12 (×2): 10 mg via ORAL
  Filled 2018-12-11 (×2): qty 1

## 2018-12-11 MED ORDER — LOSARTAN POTASSIUM 50 MG PO TABS
50.0000 mg | ORAL_TABLET | Freq: Every day | ORAL | Status: DC
  Administered 2018-12-11 – 2018-12-12 (×2): 50 mg via ORAL
  Filled 2018-12-11 (×2): qty 1

## 2018-12-11 MED ORDER — HEPARIN SODIUM (PORCINE) 1000 UNIT/ML IJ SOLN
INTRAMUSCULAR | Status: DC | PRN
  Administered 2018-12-11: 7000 [IU] via INTRAVENOUS

## 2018-12-11 MED ORDER — AMLODIPINE BESYLATE 5 MG PO TABS
5.0000 mg | ORAL_TABLET | Freq: Every day | ORAL | Status: DC
  Administered 2018-12-11 – 2018-12-12 (×2): 5 mg via ORAL
  Filled 2018-12-11 (×2): qty 1

## 2018-12-11 MED ORDER — SODIUM CHLORIDE 0.9 % IV SOLN: 250.0000 mL | INTRAVENOUS | Status: DC | PRN

## 2018-12-11 MED ORDER — DONEPEZIL HCL 5 MG PO TABS
10.0000 mg | ORAL_TABLET | Freq: Every day | ORAL | Status: DC
  Administered 2018-12-11: 10 mg via ORAL
  Filled 2018-12-11: qty 2

## 2018-12-11 MED ORDER — HEPARIN (PORCINE) IN NACL 1000-0.9 UT/500ML-% IV SOLN
INTRAVENOUS | Status: AC
  Filled 2018-12-11: qty 1000

## 2018-12-11 MED ORDER — OXYCODONE HCL 5 MG PO TABS
ORAL_TABLET | ORAL | Status: AC
  Administered 2018-12-11: 5 mg via ORAL
  Filled 2018-12-11: qty 1

## 2018-12-11 MED ORDER — CLOPIDOGREL BISULFATE 300 MG PO TABS
ORAL_TABLET | ORAL | Status: AC
  Filled 2018-12-11: qty 1

## 2018-12-11 MED ORDER — LABETALOL HCL 5 MG/ML IV SOLN: 10.0000 mg | INTRAVENOUS | Status: DC | PRN

## 2018-12-11 MED ORDER — ONDANSETRON HCL 4 MG/2ML IJ SOLN: 4.0000 mg | Freq: Four times a day (QID) | INTRAMUSCULAR | Status: DC | PRN

## 2018-12-11 MED ORDER — SODIUM CHLORIDE 0.9% FLUSH: 3.0000 mL | INTRAVENOUS | Status: DC | PRN

## 2018-12-11 MED ORDER — ACETAMINOPHEN 500 MG PO TABS
1000.0000 mg | ORAL_TABLET | Freq: Three times a day (TID) | ORAL | Status: DC
  Administered 2018-12-11 – 2018-12-12 (×2): 1000 mg via ORAL
  Filled 2018-12-11 (×2): qty 2

## 2018-12-11 MED ORDER — METFORMIN HCL 500 MG PO TABS: 500.0000 mg | ORAL_TABLET | Freq: Two times a day (BID) | ORAL | Status: DC

## 2018-12-11 MED ORDER — GLIPIZIDE ER 5 MG PO TB24
5.0000 mg | ORAL_TABLET | Freq: Every day | ORAL | Status: DC
  Administered 2018-12-12: 5 mg via ORAL
  Filled 2018-12-11: qty 1

## 2018-12-11 MED ORDER — CLOPIDOGREL BISULFATE 75 MG PO TABS
75.0000 mg | ORAL_TABLET | Freq: Every day | ORAL | Status: DC
  Administered 2018-12-12: 75 mg via ORAL
  Filled 2018-12-11: qty 1

## 2018-12-11 MED ORDER — IODIXANOL 320 MG/ML IV SOLN
INTRAVENOUS | Status: DC | PRN
  Administered 2018-12-11: 150 mL

## 2018-12-11 MED ORDER — MIDAZOLAM HCL 2 MG/2ML IJ SOLN
INTRAMUSCULAR | Status: AC
  Filled 2018-12-11: qty 2

## 2018-12-11 MED ORDER — LIDOCAINE HCL (PF) 1 % IJ SOLN
INTRAMUSCULAR | Status: DC | PRN
  Administered 2018-12-11: 15 mL

## 2018-12-11 MED ORDER — SODIUM CHLORIDE 0.9% FLUSH
3.0000 mL | Freq: Two times a day (BID) | INTRAVENOUS | Status: DC
  Administered 2018-12-12: 3 mL via INTRAVENOUS

## 2018-12-11 MED ORDER — ATORVASTATIN CALCIUM 10 MG PO TABS
20.0000 mg | ORAL_TABLET | Freq: Every day | ORAL | Status: DC
  Administered 2018-12-11 – 2018-12-12 (×2): 20 mg via ORAL
  Filled 2018-12-11 (×2): qty 2

## 2018-12-11 MED ORDER — GABAPENTIN 400 MG PO CAPS
800.0000 mg | ORAL_CAPSULE | Freq: Every day | ORAL | Status: DC
  Administered 2018-12-11: 800 mg via ORAL
  Filled 2018-12-11: qty 2

## 2018-12-11 MED ORDER — HEPARIN SODIUM (PORCINE) 1000 UNIT/ML IJ SOLN
INTRAMUSCULAR | Status: AC
  Filled 2018-12-11: qty 1

## 2018-12-11 MED ORDER — ASPIRIN EC 81 MG PO TBEC
81.0000 mg | DELAYED_RELEASE_TABLET | Freq: Every day | ORAL | Status: DC
  Administered 2018-12-11 – 2018-12-12 (×2): 81 mg via ORAL
  Filled 2018-12-11 (×2): qty 1

## 2018-12-11 MED ORDER — SENNOSIDES-DOCUSATE SODIUM 8.6-50 MG PO TABS
1.0000 | ORAL_TABLET | Freq: Every day | ORAL | Status: DC
  Administered 2018-12-11: 1 via ORAL
  Filled 2018-12-11 (×3): qty 1

## 2018-12-11 MED ORDER — ROPINIROLE HCL 1 MG PO TABS
0.5000 mg | ORAL_TABLET | Freq: Every day | ORAL | Status: DC
  Administered 2018-12-11: 0.5 mg via ORAL
  Filled 2018-12-11: qty 1

## 2018-12-11 MED ORDER — GABAPENTIN 800 MG PO TABS
800.0000 mg | ORAL_TABLET | Freq: Every day | ORAL | Status: DC
  Filled 2018-12-11: qty 1

## 2018-12-11 MED ORDER — SODIUM CHLORIDE 0.9 % WEIGHT BASED INFUSION
1.0000 mL/kg/h | INTRAVENOUS | Status: AC
  Administered 2018-12-12: 1 mL/kg/h via INTRAVENOUS

## 2018-12-11 MED ORDER — OXYCODONE HCL 5 MG PO TABS: 5.0000 mg | ORAL_TABLET | Freq: Three times a day (TID) | ORAL | Status: DC | PRN

## 2018-12-11 MED ORDER — MIDAZOLAM HCL 2 MG/2ML IJ SOLN
INTRAMUSCULAR | Status: DC | PRN
  Administered 2018-12-11: 1 mg via INTRAVENOUS

## 2018-12-11 MED ORDER — OXYCODONE HCL 5 MG PO TABS
5.0000 mg | ORAL_TABLET | Freq: Once | ORAL | Status: AC
  Administered 2018-12-11: 15:00:00 5 mg via ORAL

## 2018-12-11 MED ORDER — LIDOCAINE HCL (PF) 1 % IJ SOLN
INTRAMUSCULAR | Status: AC
  Filled 2018-12-11: qty 30

## 2018-12-11 MED ORDER — SODIUM CHLORIDE 0.9 % IV SOLN
INTRAVENOUS | Status: DC
  Administered 2018-12-11: 13:00:00 via INTRAVENOUS

## 2018-12-11 MED ORDER — HYDRALAZINE HCL 20 MG/ML IJ SOLN: 5.0000 mg | INTRAMUSCULAR | Status: DC | PRN

## 2018-12-11 MED ORDER — OXYCODONE-ACETAMINOPHEN 5-325 MG PO TABS
1.0000 | ORAL_TABLET | ORAL | Status: DC | PRN
  Administered 2018-12-12 (×2): 1 via ORAL
  Filled 2018-12-11 (×2): qty 1

## 2018-12-11 MED ORDER — ALFUZOSIN HCL ER 10 MG PO TB24
10.0000 mg | ORAL_TABLET | Freq: Every day | ORAL | Status: DC
  Administered 2018-12-12: 10 mg via ORAL
  Filled 2018-12-11: qty 1

## 2018-12-11 MED ORDER — FENTANYL CITRATE (PF) 100 MCG/2ML IJ SOLN
INTRAMUSCULAR | Status: DC | PRN
  Administered 2018-12-11: 25 ug via INTRAVENOUS
  Administered 2018-12-11 (×2): 50 ug via INTRAVENOUS

## 2018-12-11 SURGICAL SUPPLY — 23 items
BALLN IN.PACT DCB 4X120 (BALLOONS) ×6
CATH ANGIO 5F PIGTAIL 65CM (CATHETERS) ×2 IMPLANT
CATH CROSS OVER TEMPO 5F (CATHETERS) ×1 IMPLANT
CATH MUSTANG 4X200X135 (BALLOONS) ×1 IMPLANT
CATH QUICKCROSS SUPP .035X90CM (MICROCATHETER) ×1 IMPLANT
CATH STRAIGHT 5FR 65CM (CATHETERS) ×1 IMPLANT
DCB IN.PACT 4X120 (BALLOONS) ×2 IMPLANT
GLIDEWIRE ADV .035X260CM (WIRE) ×1 IMPLANT
GUIDEWIRE ANGLED .035X150CM (WIRE) ×2 IMPLANT
KIT ENCORE 26 ADVANTAGE (KITS) ×2 IMPLANT
KIT MICROPUNCTURE NIT STIFF (SHEATH) ×1 IMPLANT
KIT PV (KITS) ×3 IMPLANT
SHEATH PINNACLE 5F 10CM (SHEATH) ×2 IMPLANT
SHEATH PINNACLE MP 6F 45CM (SHEATH) ×1 IMPLANT
SHEATH PROBE COVER 6X72 (BAG) ×2 IMPLANT
SYR MEDRAD MARK V 150ML (SYRINGE) ×1 IMPLANT
TAPE VIPERTRACK RADIOPAQ (MISCELLANEOUS) IMPLANT
TAPE VIPERTRACK RADIOPAQUE (MISCELLANEOUS) ×3
TRANSDUCER W/STOPCOCK (MISCELLANEOUS) ×3 IMPLANT
TRAY PV CATH (CUSTOM PROCEDURE TRAY) ×3 IMPLANT
WIRE BENTSON .035X145CM (WIRE) ×1 IMPLANT
WIRE HITORQ VERSACORE ST 145CM (WIRE) ×1 IMPLANT
WIRE ROSEN-J .035X260CM (WIRE) ×1 IMPLANT

## 2018-12-11 NOTE — Progress Notes (Signed)
Rt groin long 35F fa sheath pulled by Sherlyn Lick

## 2018-12-11 NOTE — Interval H&P Note (Signed)
History and Physical Interval Note:  12/11/2018 4:37 PM  Billy Torres  has presented today for surgery, with the diagnosis of pvd ulcer.  The various methods of treatment have been discussed with the patient and family. After consideration of risks, benefits and other options for treatment, the patient has consented to  Procedure(s): LOWER EXTREMITY ANGIOGRAPHY - Left lower (N/A) as a surgical intervention.  The patient's history has been reviewed, patient examined, no change in status, stable for surgery.  I have reviewed the patient's chart and labs.  Questions were answered to the patient's satisfaction.     Deitra Mayo

## 2018-12-11 NOTE — Op Note (Signed)
PATIENT: Billy Torres      MRN: [id number] DOB: June 12, 1955    DATE OF PROCEDURE: 12/11/2018  INDICATIONS:    [name] is a 63 y.o. male who was admitted with a nonhealing wound of the left foot and evidence of infrainguinal arterial occlusive disease.  He was static scheduled for arteriography and possible intervention.  PROCEDURE:    1.  Conscious sedation 2.  Ultrasound-guided access to the right common femoral artery 3.  Aortogram with bilateral iliac arteriogram 4.  Selective catheterization of the left superficial femoral artery (third order catheterization) 5.  Angioplasty of the left superficial femoral artery including drug-coated balloon angioplasty  SURGEON: Judeth Cornfield. Scot Dock, MD, FACS  ANESTHESIA: Local with sedation  EBL: Minimal  TECHNIQUE: The patient was brought to the peripheral vascular lab and was sedated. The period of conscious sedation was 70 minutes.  During that time period, I was present face-to-face 100% of the time.  The patient was administered 1 mg of Versed and 50 mcg of fentanyl initially. The patient's heart rate, blood pressure, and oxygen saturation were monitored by the nurse continuously during the procedure.  Both groins were prepped and draped in the usual sterile fashion.  Under ultrasound guidance, after the skin was anesthetized, I cannulated the right common femoral artery with a micropuncture needle and a micropuncture sheath was introduced over a wire.  This was exchanged for a 5 [place] sheath over a Bentson wire.  By ultrasound the femoral artery was patent. A real-time image was obtained and sent to the server.  The pigtail catheter was positioned at the L1 vertebral body and flush aortogram obtained.  The cath was then positioned above the aortic bifurcation and an oblique iliac projection was obtained.  I then catheterized the proximal left common iliac artery and the guidewire was advanced down into the external iliac  artery.  The straight catheter was advanced over the wire and selective left external iliac arteriogram was obtained with left lower extremity runoff.  There was a long segment occlusion of the superficial femoral artery and I thought this could be addressed possibly with angioplasty.  The 5 French sheath was exchanged for a long destination sheath over a Rosen wire and this was positioned into the superficial femoral artery.  I was then able to get an Glidewire advantage wire through the stenosis and advance a quick cross catheter beyond the stenosis.  Injection of contrast through the quick cross catheter confirmed intraluminal position.  The wire was then advanced back into the popliteal artery.  Arteriogram was obtained to demonstrate the area of the stenosis.  I selected initially a 4 mm x 200 cm balloon for previous dilatation and this was inflated to 10 atm for 1 minute.  Completion film showed a good result with no significant dissection.  I therefore elected to go back with a drug-coated balloon.  Given the length of the stenosis I used 2 120 cm balloons.  These were inflated slowly to nominal pressure for 3 minutes.  Completion film showed an excellent result.  Follow-up film showed no change in the distal runoff which was the peroneal artery only.  The sheath was retracted down into the right external iliac artery and the patient was transferred to the holding area for removal of the sheath.  No immediate complications were noted.  FINDINGS:   1.  There are 2 renal arteries on the right and 3 renal arteries on the left.  The infrarenal aorta and  bilateral common iliac arteries are widely patent.  The external iliac and hypogastric arteries are patent. 2.  On the left side the common femoral and deep femoral artery are patent.  The proximal superficial femoral artery is patent but then is occluded.  There is reconstitution of the superficial femoral artery in the mid thigh with moderate diffuse  disease to the adductor canal.  This long segment occlusion was addressed with balloon angioplasty as described above. 3.  Below the stenosis the popliteal artery is patent and there is single-vessel runoff via the peroneal artery.  CLINICAL NOTE: The patient will be kept for overnight observation.  Deitra Mayo, MD, FACS [facility]  DATE OF DICTATION:   12/11/2018

## 2018-12-11 NOTE — Progress Notes (Signed)
Backup phone number for  [facility]- transport.  [phone]  Per Jacqlyn Larsen at [facility] office.

## 2018-12-11 NOTE — Progress Notes (Signed)
Site area: rt groin fa sheath pulled and pressure held by Sherlyn Lick Site Prior to Removal:  Level 0 Pressure Applied For:  20 minutes Manual:   yes Patient Status During Pull:  stable Post Pull Site:  Level 0 Post Pull Instructions Given:  yes Post Pull Pulses Present: rt dp dopplered Dressing Applied:  Gauze and tegaderm Bedrest begins @ 2010 Comments:

## 2018-12-11 NOTE — Interval H&P Note (Signed)
History and Physical Interval Note:  12/11/2018 4:20 PM  Billy Torres  has presented today for surgery, with the diagnosis of pvd ulcer.  The various methods of treatment have been discussed with the patient and family. After consideration of risks, benefits and other options for treatment, the patient has consented to  Procedure(s): LOWER EXTREMITY ANGIOGRAPHY - Left lower (N/A) as a surgical intervention.  The patient's history has been reviewed, patient examined, no change in status, stable for surgery.  I have reviewed the patient's chart and labs.  Questions were answered to the patient's satisfaction.     Deitra Mayo

## 2018-12-12 DIAGNOSIS — I70245 Atherosclerosis of native arteries of left leg with ulceration of other part of foot: Secondary | ICD-10-CM | POA: Diagnosis not present

## 2018-12-12 LAB — GLUCOSE, CAPILLARY: Glucose-Capillary: 89 mg/dL (ref 70–99)

## 2018-12-12 MED ORDER — CLOPIDOGREL BISULFATE 75 MG PO TABS: 75.0000 mg | ORAL_TABLET | Freq: Every day | ORAL | 6 refills | Status: DC

## 2018-12-12 NOTE — Progress Notes (Signed)
Admitted from Cath Lab to [facility] with RN ,alert and oriented.Marland Kitchenattached to cardiac monitoring,CCMD notified pulses,V/S checked.Oriented to  room and call bell within reach.Will continue to monitor

## 2018-12-12 NOTE — Progress Notes (Signed)
   VASCULAR SURGERY ASSESSMENT & PLAN:   POD 1 -ANGIOPLASTY LEFT SUPERFICIAL FEMORAL ARTERY: The patient underwent successful drug-coated balloon angioplasty of an occluded left superficial femoral artery.  He has peroneal runoff only and this morning has a brisk peroneal signal with the Doppler.  I will see him back in the office in 3 to 4 weeks to follow the wound on his left foot.  Discharge today on aspirin, Plavix, and a statin.   SUBJECTIVE:   No complaints this morning.  Wants to go home.  PHYSICAL EXAM:   Vitals:   12/11/18 2005 12/11/18 2028 12/12/18 0009 12/12/18 0409  BP: 137/76 133/72 104/69 (!) 96/59  Pulse: 69 71 62 64  Resp: 13 15 12 14   Temp:  98.6 F (37 C) 98.4 F (36.9 C) 98.6 F (37 C)  TempSrc:  Oral Oral Oral  SpO2: 98% 100% 98% 97%  Weight:  69.7 kg    Height:       Brisk Doppler signal left peroneal artery Right groin without hematoma  LABS:   Lab Results  Component Value Date   WBC 7.9 08/16/2016   HGB 13.6 12/11/2018   HCT 40.0 12/11/2018   MCV 84.7 08/16/2016   PLT 337 08/16/2016   Lab Results  Component Value Date   CREATININE 0.90 12/11/2018   Lab Results  Component Value Date   INR 1.04 05/14/2015   CBG (last 3)  Recent Labs    12/11/18 1856 12/11/18 2058 12/12/18 0620  GLUCAP 93 114* 89    PROBLEM LIST:    Active Problems:   Atherosclerotic PVD with ulceration (HCC)   CURRENT MEDS:   . acetaminophen  1,000 mg Oral TID  . alfuzosin  10 mg Oral Q breakfast  . amLODipine  5 mg Oral Daily  . aspirin EC  81 mg Oral Daily  . atorvastatin  20 mg Oral Daily  . clopidogrel  75 mg Oral Q breakfast  . donepezil  10 mg Oral QHS  . gabapentin  800 mg Oral QHS  . glipiZIDE  5 mg Oral Q breakfast  . loratadine  10 mg Oral Daily  . losartan  50 mg Oral Daily  . metFORMIN  500 mg Oral BID WC  . rOPINIRole  0.5 mg Oral QHS  . senna-docusate  1 tablet Oral QHS  . sodium chloride flush  3 mL Intravenous Q12H  . traZODone  100  mg Oral QHS    Deitra Mayo Office: [phone] 12/12/2018

## 2018-12-12 NOTE — Progress Notes (Signed)
12/12/2018 3:59 PM Discharge AVS meds taken today and those due this evening reviewed.  Follow-up appointments and when to call md reviewed.  D/C IV and TELE.  Questions and concerns addressed.   D/C home per orders via [employer] with voucher. [place]

## 2018-12-12 NOTE — Discharge Instructions (Signed)
° °  [facility]  Discharge Instructions  Lower Extremity Angiogram; Angioplasty/Stenting  Please refer to the following instructions for your post-procedure care. Your surgeon or physician assistant will discuss any changes with you.  Activity  Avoid lifting more than 8 pounds (1 gallons of milk) for 72 hours (3 days) after your procedure. You may walk as much as you can tolerate. It's OK to drive after 72 hours.  Bathing/Showering  You may shower the day after your procedure. If you have a bandage, you may remove it at 24- 48 hours. Clean your incision site with mild soap and water. Pat the area dry with a clean towel.  Diet  Resume your pre-procedure diet. There are no special food restrictions following this procedure. All patients with peripheral vascular disease should follow a low fat/low cholesterol diet. In order to heal from your surgery, it is CRITICAL to get adequate nutrition. Your body requires vitamins, minerals, and protein. Vegetables are the best source of vitamins and minerals. Vegetables also provide the perfect balance of protein. Processed food has little nutritional value, so try to avoid this.  Medications  Resume taking all of your medications unless your doctor tells you not to. If your incision is causing pain, you may take over-the-counter pain relievers such as acetaminophen (Tylenol)  RESTART YOUR METFORMIN ON December 14, 2018 AND NOT BEFORE THEN BECAUSE YOU RECEIVED CONTRAST DURING YOUR PROCEDURE.  Follow Up  Follow up will be arranged at the time of your procedure. You may have an office visit scheduled or may be scheduled for surgery. Ask your surgeon if you have any questions.  Please call us immediately for any of the following conditions: Severe or worsening pain your legs or feet at rest or with walking. Increased pain, redness, drainage at your groin puncture site. Fever of 101 degrees or higher. If you have  any mild or slow bleeding from your puncture site: lie down, apply firm constant pressure over the area with a piece of gauze or a clean wash cloth for 30 minutes- no peeking!, call 911 right away if you are still bleeding after 30 minutes, or if the bleeding is heavy and unmanageable.  Reduce your risk factors of vascular disease:   Stop smoking. If you would like help call [facility] at 1-800-QUIT-NOW 581-285-9494) or [place] at [phone].  Manage your cholesterol  Maintain a desired weight  Control your diabetes  Keep your blood pressure down   If you have any questions, please call the office at [phone]

## 2018-12-12 NOTE — Progress Notes (Signed)
CSW was alerted that patient needed a taxi voucher. CSW spoke with RN and patient to ascertain if patient had someone to pick him up. CSW was informed that they were unable to reach anyone. Patient does not have the resources to pay for transportation. A taxi voucher was given.

## 2018-12-12 NOTE — Discharge Summary (Signed)
Discharge Summary    Billy Torres 1955/10/21 63 y.o. male  [id number]  Admission Date: 12/11/2018  Discharge Date: 12/12/2018  Physician: Angelia Mould, *  Admission Diagnosis: Atherosclerotic PVD with ulceration ([place]) [I70.209, L98.499]   HPI:   This is a 63 y.o. male  here for further evaluation of peripheral vascular occlusive disease.  He initially saw Dr. Scot Dock in February 2020.  At that time he was not walking to the level of claudication and did not have any lower extremity tissue loss.  Ankle arm index was 0.7 bilaterally and he was instructed to return should he have any progression.  He now presents with a several week history of ulceration on the dorsum of his left foot.  This is at the base of his third and fourth toe.  It is not responded to antibiotics.  Hospital Course:  The patient was admitted to the hospital and taken to the operating room on 12/11/2018 and underwent: 1.  Conscious sedation 2.  Ultrasound-guided access to the right common femoral artery 3.  Aortogram with bilateral iliac arteriogram 4.  Selective catheterization of the left superficial femoral artery (third order catheterization) 5.  Angioplasty of the left superficial femoral artery including drug-coated balloon angioplasty  Findings: 1.  There are 2 renal arteries on the right and 3 renal arteries on the left.  The infrarenal aorta and bilateral common iliac arteries are widely patent.  The external iliac and hypogastric arteries are patent. 2.  On the left side the common femoral and deep femoral artery are patent.  The proximal superficial femoral artery is patent but then is occluded.  There is reconstitution of the superficial femoral artery in the mid thigh with moderate diffuse disease to the adductor canal.  This long segment occlusion was addressed with balloon angioplasty as described above. 3.  Below the stenosis the popliteal artery is patent and there is single-vessel  runoff via the peroneal artery.  CLINICAL NOTE: The patient will be kept for overnight observation  The pt tolerated the procedure well and was transported to the PACU in good condition.   He was doing well and discharged the next day.  The remainder of the hospital course consisted of increasing mobilization and increasing intake of solids without difficulty.  CBC    Component Value Date/Time   WBC 7.9 08/16/2016 0253   RBC 3.47 (L) 08/16/2016 0253   HGB 13.6 12/11/2018 1232   HCT 40.0 12/11/2018 1232   PLT 337 08/16/2016 0253   MCV 84.7 08/16/2016 0253   MCH 27.7 08/16/2016 0253   MCHC 32.7 08/16/2016 0253   RDW 13.4 08/16/2016 0253   LYMPHSABS 2,263 08/14/2015 1552   MONOABS 511 08/14/2015 1552   EOSABS 146 08/14/2015 1552   BASOSABS 0 08/14/2015 1552    BMET    Component Value Date/Time   NA 135 12/11/2018 1232   K 5.4 (H) 12/11/2018 1232   CL 108 12/11/2018 1232   CO2 27 08/16/2016 0253   GLUCOSE 112 (H) 12/11/2018 1232   BUN 14 12/11/2018 1232   CREATININE 0.90 12/11/2018 1232   CREATININE 0.92 08/14/2015 1552   CALCIUM 9.1 08/16/2016 0253   GFRNONAA 50 (L) 08/16/2016 0253   GFRNONAA 89 08/14/2015 1552   GFRAA 58 (L) 08/16/2016 0253   GFRAA >89 08/14/2015 1552        Discharge Diagnosis:  Atherosclerotic PVD with ulceration ([place]) [I70.209, L98.499]  Secondary Diagnosis: Patient Active Problem List   Diagnosis Date Noted  .  Atherosclerotic PVD with ulceration ([place]) 12/11/2018  . Hydronephrosis due to obstruction of bladder   . Acute urinary retention 08/15/2016  . Malignant neoplasm of prostate ([place]) 02/26/2016  . Muscle spasticity   . Dementia ([place])   . Cerebral infarction due to unspecified mechanism   . Leukocytosis   . Acute thrombotic stroke ([place]) 05/17/2015  . Diabetes mellitus due to underlying condition with complication, without long-term current use of insulin ([place])   . Gait disturbance, post-stroke   . Hemiparesis ([place])   .  Cognitive deficit due to old cerebral infarction   . Benign essential HTN   . Diabetes mellitus type 2 in nonobese (HCC)   . Essential hypertension   . History of CVA (cerebrovascular accident) without residual deficits   . Ataxia, post-stroke   . Dysarthria, post-stroke   . Tobacco abuse   . HLD (hyperlipidemia)   . Acute CVA (cerebrovascular accident) ([place]) 05/15/2015  . Left-sided weakness 05/14/2015  . Right shoulder pain 03/03/2014  . History of CVA (cerebrovascular accident) 02/28/2014  . Smoking 06/29/2013  . CVA (cerebral infarction) 06/29/2013  . Dyslipidemia 06/29/2013  . TIA (transient ischemic attack) 06/27/2013  . Diabetes ([place]) 06/27/2013   Past Medical History:  Diagnosis Date  . Abnormal rate of speech    post stroke  . Anticoagulant long-term use    plavix  . Arthritis of left shoulder region   . At high risk for falls   . Chronic constipation   . Chronic kidney disease    chronic kidney disease   . Cognitive deficit due to old cerebral infarction   . Constipation   . DDD (degenerative disc disease), cervical   . Degenerative cervical spinal stenosis   . Dysphagia   . Frequency of urination   . Gait disturbance, post-stroke    uses cane  . History of ischemic left MCA stroke 06/27/2013   w/ evidence hemorrhagia, high grade stenosis bilateral proximal MCA and extensive small matter disease--- symptoms resolved  . History of ischemic stroke without residual deficits 05-14-2015---  residual left hemiparesis and cognitive deficits   small nonhemorrhegic infarct right corona radiata posterior limb and progressive right m2 stenosis  . Hyperlipidemia   . Hypertension   . Indwelling urethral catheter present   . Insomnia   . Internal hemorrhoids   . Left hemiparesis (HCC)    RESIDUAL FROM STROKE--  WALKS W/ CANE  . Mild atherosclerosis of carotid artery, right    RICA 0-39% per duplex 06-28-2013  . Night sweats   . OA (osteoarthritis)    LEFT SHOULDER  .  Painful bladder spasm   . Peripheral arterial disease ([place])   . Prostate cancer [facility]) urologist-  dr Gaynelle Arabian  oncologist-  dr [name]   dx 12/ 2017-- Stage T1c, Gleason 3+4,  PSA 10.4,  vol 51cc  . Sensorineural hearing loss, bilateral   . Type 2 diabetes mellitus treated with insulin ([place])   . Urge incontinence    wears depends  . Urinary retention   . Wears dentures      Allergies as of 12/12/2018   No Known Allergies     Medication List    TAKE these medications   acetaminophen 500 MG tablet Commonly known as: TYLENOL Take 1,000 mg by mouth 3 (three) times daily.   alfuzosin 10 MG 24 hr tablet Commonly known as: UROXATRAL Take 10 mg by mouth daily with breakfast.   amLODipine 5 MG tablet Commonly known as: NORVASC Take 5 mg by  mouth daily.   aspirin EC 81 MG tablet Take 81 mg by mouth daily.   atorvastatin 20 MG tablet Commonly known as: LIPITOR Take 20 mg by mouth daily.   clopidogrel 75 MG tablet Commonly known as: PLAVIX Take 1 tablet (75 mg total) by mouth daily with breakfast.   donepezil 10 MG tablet Commonly known as: ARICEPT Take 10 mg by mouth at bedtime.   fexofenadine 60 MG tablet Commonly known as: ALLEGRA Take 60 mg by mouth 2 (two) times daily as needed for allergies or rhinitis.   gabapentin 800 MG tablet Commonly known as: NEURONTIN Take 800 mg by mouth at bedtime.   glipiZIDE 5 MG 24 hr tablet Commonly known as: GLUCOTROL XL Take 5 mg by mouth daily with breakfast.   losartan 50 MG tablet Commonly known as: COZAAR Take 50 mg by mouth daily.   metFORMIN 500 MG tablet Commonly known as: GLUCOPHAGE Take 500 mg by mouth 2 (two) times daily with a meal.   oxyCODONE 5 MG immediate release tablet Commonly known as: Oxy IR/ROXICODONE Take 5 mg by mouth 3 (three) times daily as needed for severe pain.   rOPINIRole 0.5 MG tablet Commonly known as: REQUIP Take 0.5 mg by mouth at bedtime.   sennosides-docusate sodium 8.6-50 MG  tablet Commonly known as: SENOKOT-S Take 1 tablet by mouth at bedtime.   traZODone 100 MG tablet Commonly known as: DESYREL Take 100 mg by mouth at bedtime.         Instructions:  [facility]  Discharge Instructions  Lower Extremity Angiogram; Angioplasty/Stenting  Please refer to the following instructions for your post-procedure care. Your surgeon or physician assistant will discuss any changes with you.  Activity  Avoid lifting more than 8 pounds (1 gallons of milk) for 72 hours (3 days) after your procedure. You may walk as much as you can tolerate. It's OK to drive after 72 hours.  Bathing/Showering  You may shower the day after your procedure. If you have a bandage, you may remove it at 24- 48 hours. Clean your incision site with mild soap and water. Pat the area dry with a clean towel.  Diet  Resume your pre-procedure diet. There are no special food restrictions following this procedure. All patients with peripheral vascular disease should follow a low fat/low cholesterol diet. In order to heal from your surgery, it is CRITICAL to get adequate nutrition. Your body requires vitamins, minerals, and protein. Vegetables are the best source of vitamins and minerals. Vegetables also provide the perfect balance of protein. Processed food has little nutritional value, so try to avoid this.  Medications  Resume taking all of your medications unless your doctor tells you not to. If your incision is causing pain, you may take over-the-counter pain relievers such as acetaminophen (Tylenol)  Follow Up  Follow up will be arranged at the time of your procedure. You may have an office visit scheduled or may be scheduled for surgery. Ask your surgeon if you have any questions.  Please call us immediately for any of the following conditions: .Severe or worsening pain your legs or feet at rest or with walking. .Increased pain, redness, drainage at your  groin puncture site. .Fever of 101 degrees or higher. .If you have any mild or slow bleeding from your puncture site: lie down, apply firm constant pressure over the area with a piece of gauze or a clean wash cloth for 30 minutes- no peeking!, call 911 right away  if you are still bleeding after 30 minutes, or if the bleeding is heavy and unmanageable.  Reduce your risk factors of vascular disease:  . Stop smoking. If you would like help call [facility] at 1-800-QUIT-NOW 8254252418) or [place] at [phone]. . Manage your cholesterol . Maintain a desired weight . Control your diabetes . Keep your blood pressure down .  If you have any questions, please call the office at [phone]  Prescriptions given:  1.  Plavix 75mg  #30 with six refills  Disposition: home  Patient's condition: is Good  Follow up: 1. Dr. Scot Dock in 4-6 weeks with ABI's and LLE arterial duplex   Leontine Locket, PA-C [facility] [phone] 12/12/2018  7:00 AM

## 2018-12-12 NOTE — TOC Initial Note (Signed)
Transition of Care [facility]) - Initial/Assessment Note    Patient Details  Name: Billy Torres MRN: [id number] Date of Birth: 05/13/1955  Transition of [facility]) CM/SW Contact:    Vinie Sill, [place] Phone Number: 12/12/2018, 1:47 PM  Clinical Narrative:                  CSW spoke with patient's granddaughter, Janett Billow. CSW introduced self and explained role. CSW was informed patient lives in the home with his wife, Kalman Shan, which at times has some confusion. Patient has son that lives in another states. CSW discussed PT recommendation of ST rehab at SNF once medically ready for discharge. Patient's granddaughter was agreeable to ST rehab at [facility]. She advised, patient has never been to rehab but his spouse has been in [facility] in [place] about month ago. [facility] in [place] is their top choices but was open to sending referral to other SNFs in the area except [facility]. CSW explained the SNF process. Family states no other questions or concerns at this time.   Thurmond Butts, MSW, [facility] Clinical Social Worker [phone]   Expected Discharge Plan: Skilled Nursing Facility Barriers to Discharge: Continued Medical Work up   Patient Goals and CMS Choice        Expected Discharge Plan and Services Expected Discharge Plan: [place] arrangements for the past 2 months: Single Family Home Expected Discharge Date: 12/12/18                                    Prior Living Arrangements/Services Living arrangements for the past 2 months: Single Family Home Lives with:: Self, Spouse Patient language and need for interpreter reviewed:: No        Need for Family Participation in Patient Care: Yes (Comment) Care giver support system in place?: Yes (comment)   Criminal Activity/Legal Involvement Pertinent to Current Situation/Hospitalization: No - Comment as needed  Activities of Daily Living      Permission  Sought/Granted Permission sought to share information with : Family Supports Permission granted to share information with : Yes, Verbal Permission Granted  Share Information with NAME: Nemiah Commander  Permission granted to share info w AGENCY: SNFs  Permission granted to share info w Relationship: granddaughter  Permission granted to share info w Contact Information: [phone]  Emotional Assessment   Attitude/Demeanor/Rapport: Unable to Assess Affect (typically observed): Unable to Assess Orientation: : Oriented to Self, Oriented to Place, Oriented to  Time, Oriented to Situation Alcohol / Substance Use: Not Applicable Psych Involvement: No (comment)  Admission diagnosis:  Atherosclerotic PVD with ulceration ([place]) [I70.209, L98.499] Patient Active Problem List   Diagnosis Date Noted  . Atherosclerotic PVD with ulceration ([place]) 12/11/2018  . Hydronephrosis due to obstruction of bladder   . Acute urinary retention 08/15/2016  . Malignant neoplasm of prostate ([place]) 02/26/2016  . Muscle spasticity   . Dementia ([place])   . Cerebral infarction due to unspecified mechanism   . Leukocytosis   . Acute thrombotic stroke ([place]) 05/17/2015  . Diabetes mellitus due to underlying condition with complication, without long-term current use of insulin ([place])   . Gait disturbance, post-stroke   . Hemiparesis ([place])   . Cognitive deficit due to old cerebral infarction   . Benign essential HTN   . Diabetes mellitus type 2 in nonobese (HCC)   . Essential hypertension   .  History of CVA (cerebrovascular accident) without residual deficits   . Ataxia, post-stroke   . Dysarthria, post-stroke   . Tobacco abuse   . HLD (hyperlipidemia)   . Acute CVA (cerebrovascular accident) ([place]) 05/15/2015  . Left-sided weakness 05/14/2015  . Right shoulder pain 03/03/2014  . History of CVA (cerebrovascular accident) 02/28/2014  . Smoking 06/29/2013  . CVA (cerebral infarction) 06/29/2013  . Dyslipidemia  06/29/2013  . TIA (transient ischemic attack) 06/27/2013  . Diabetes ([place]) 06/27/2013   PCP:  Angelica Pou, MD Pharmacy:   [id number] - [place], [address] [place] [address] Phone: [phone] Fax: [place], [place] [address] [place] [address] Phone: [phone] Fax: [phone]  [place], [place] - [address] [place] [address] Phone: [phone] Fax: [phone]     Social Determinants of Health (SDOH) Interventions    Readmission Risk Interventions No flowsheet data found.

## 2018-12-14 ENCOUNTER — Encounter (HOSPITAL_COMMUNITY): Payer: Self-pay | Admitting: Vascular Surgery

## 2019-01-26 ENCOUNTER — Other Ambulatory Visit: Payer: Self-pay

## 2019-01-26 DIAGNOSIS — I739 Peripheral vascular disease, unspecified: Secondary | ICD-10-CM

## 2019-01-27 ENCOUNTER — Ambulatory Visit (INDEPENDENT_AMBULATORY_CARE_PROVIDER_SITE_OTHER): Payer: Medicare (Managed Care) | Admitting: Vascular Surgery

## 2019-01-27 ENCOUNTER — Ambulatory Visit (HOSPITAL_COMMUNITY)
Admission: RE | Admit: 2019-01-27 | Discharge: 2019-01-27 | Disposition: A | Discharge status: Home / Self Care | Payer: Medicare (Managed Care) | Source: Ambulatory Visit | Attending: Vascular Surgery | Admitting: Vascular Surgery

## 2019-01-27 ENCOUNTER — Ambulatory Visit (INDEPENDENT_AMBULATORY_CARE_PROVIDER_SITE_OTHER)
Admission: RE | Admit: 2019-01-27 | Discharge: 2019-01-27 | Disposition: A | Discharge status: Home / Self Care | Payer: Medicare (Managed Care) | Source: Ambulatory Visit | Attending: Vascular Surgery | Admitting: Vascular Surgery

## 2019-01-27 ENCOUNTER — Other Ambulatory Visit: Payer: Self-pay

## 2019-01-27 ENCOUNTER — Encounter: Payer: Self-pay | Admitting: Vascular Surgery

## 2019-01-27 VITALS — BP 127/72 | HR 80 | Temp 97.7°F | Resp 20 | Ht 68.0 in | Wt 160.5 lb

## 2019-01-27 DIAGNOSIS — I739 Peripheral vascular disease, unspecified: Secondary | ICD-10-CM | POA: Diagnosis not present

## 2019-01-27 DIAGNOSIS — I70299 Other atherosclerosis of native arteries of extremities, unspecified extremity: Secondary | ICD-10-CM | POA: Diagnosis not present

## 2019-01-27 DIAGNOSIS — L97909 Non-pressure chronic ulcer of unspecified part of unspecified lower leg with unspecified severity: Secondary | ICD-10-CM | POA: Diagnosis not present

## 2019-01-27 NOTE — Progress Notes (Signed)
Patient name: Billy Torres MRN: [id number] DOB: [birth year] Sex: male  REASON FOR VISIT:   Follow-up after angioplasty and stenting of the left superficial femoral artery.  HPI:   Billy Torres is a pleasant 64 y.o. male who had been admitted to the hospital with a nonhealing wound of the left foot and evidence of infrainguinal arterial occlusive disease.  On 12/11/2018 he underwent arteriography with angioplasty and stenting of the left superficial femoral artery using a drug-coated balloon.  This was a 4 mm in diameter balloon.  He had an excellent result from this with no residual stenosis.  Below this there was single-vessel runoff on the left via the peroneal artery.  The patient states that he thinks the wound on the dorsum of his left foot is improving.  He has been keeping bacitracin on this.  I do not get any clear-cut history of claudication although his activity is very limited.  He walks with a cane.  He denies any history of rest pain.  He is on aspirin, Plavix, and a statin.  Current Outpatient Medications  Medication Sig Dispense Refill  . acetaminophen (TYLENOL) 500 MG tablet Take 1,000 mg by mouth 3 (three) times daily.    Marland Kitchen alfuzosin (UROXATRAL) 10 MG 24 hr tablet Take 10 mg by mouth daily with breakfast.    . amLODipine (NORVASC) 5 MG tablet Take 5 mg by mouth daily.    Marland Kitchen aspirin EC 81 MG tablet Take 81 mg by mouth daily.    Marland Kitchen atorvastatin (LIPITOR) 20 MG tablet Take 20 mg by mouth daily.     . clopidogrel (PLAVIX) 75 MG tablet Take 1 tablet (75 mg total) by mouth daily with breakfast. 30 tablet 6  . donepezil (ARICEPT) 10 MG tablet Take 10 mg by mouth at bedtime.    . fexofenadine (ALLEGRA) 60 MG tablet Take 60 mg by mouth 2 (two) times daily as needed for allergies or rhinitis.    Marland Kitchen gabapentin (NEURONTIN) 800 MG tablet Take 800 mg by mouth at bedtime.    Marland Kitchen glipiZIDE (GLUCOTROL XL) 5 MG 24 hr tablet Take 5 mg by mouth daily with breakfast.    . losartan (COZAAR)  50 MG tablet Take 50 mg by mouth daily.     . metFORMIN (GLUCOPHAGE) 500 MG tablet Take 500 mg by mouth 2 (two) times daily with a meal.     . oxyCODONE (OXY IR/ROXICODONE) 5 MG immediate release tablet Take 5 mg by mouth 3 (three) times daily as needed for severe pain.    Marland Kitchen rOPINIRole (REQUIP) 0.5 MG tablet Take 0.5 mg by mouth at bedtime.    . sennosides-docusate sodium (SENOKOT-S) 8.6-50 MG tablet Take 1 tablet by mouth at bedtime.     . traZODone (DESYREL) 100 MG tablet Take 100 mg by mouth at bedtime.     No current facility-administered medications for this visit.    REVIEW OF SYSTEMS:  [X]  denotes positive finding, [ ]  denotes negative finding Vascular    Leg swelling    Cardiac    Chest pain or chest pressure:    Shortness of breath upon exertion:    Short of breath when lying flat:    Irregular heart rhythm:    Constitutional    Fever or chills:     PHYSICAL EXAM:   Vitals:   01/27/19 1348  BP: 127/72  Pulse: 80  Resp: 20  Temp: 97.7 F (36.5 C)  SpO2: 100%  Weight: 160 lb 8  oz (72.8 kg)  Height: 5\' 8"  (1.727 m)    GENERAL: The patient is a well-nourished male, in no acute distress. The vital signs are documented above. CARDIOVASCULAR: There is a regular rate and rhythm. PULMONARY: There is good air exchange bilaterally without wheezing or rales. VASCULAR: He has a monophasic peroneal signal with the Doppler.  He has a femoral pulse bilaterally. EXTREMITIES: The wound on his left foot on the dorsum adjacent to the second and third toes, measures approximately 1 cm x 1.5 cm.     DATA:   LEFT LOWER EXTREMITY DUPLEX: I have independently interpreted his duplex of the left lower extremity.  He does have some elevated velocities in the proximal superficial femoral artery suggesting a greater than 75% stenosis.  There is monophasic flow below that.  Segment that underwent angioplasty is patent.  ARTERIAL DOPPLER STUDY: I have independently interpreted his arterial  Doppler study.  On the right side he has a monophasic dorsalis pedis and posterior tibial signal.  ABI cannot be obtained as the arteries are calcified.  Toe pressures 51 mmHg.  On the left side there is a monophasic dorsalis pedis and posterior tibial signal.  ABIs 100%.  Toe pressure 69 mmHg.  MEDICAL ISSUES:   PERIPHERAL VASCULAR DISEASE WITH ULCER: The patient has undergone successful angioplasty of the left superficial femoral artery.  He has peroneal runoff only with a small but worrisome wound on the plantar aspect of his left foot.  The peroneal artery occludes in the distal leg with really no good options for revascularization.  I have ordered a follow-up duplex in 3 months and ABIs at that time.  We will continue to follow his wound.  He knows to call sooner if he has problems.   Deitra Mayo [facility] [place] [phone]

## 2019-01-29 ENCOUNTER — Other Ambulatory Visit: Payer: Self-pay | Admitting: *Deleted

## 2019-01-29 DIAGNOSIS — I739 Peripheral vascular disease, unspecified: Secondary | ICD-10-CM

## 2019-01-29 DIAGNOSIS — I70299 Other atherosclerosis of native arteries of extremities, unspecified extremity: Secondary | ICD-10-CM

## 2019-02-11 NOTE — Progress Notes (Signed)
POST OPERATIVE OFFICE NOTE    CC:  F/u for left foot ulcer  HPI:  This is a 65 y.o. male who is s/p Angioplasty of the left superficial femoral artery including drug-coated balloon angioplasty on 12/11/2018.  He was seen in follow-up on January 20 with Dr. Scot Dock.  ABIs and duplex ultrasound were performed.  On the left, ABI was approximately 1 and toe pressure was 69 mmHg.  Arrangements were made for 73-month follow-up Today, he is referred by Dr. Jimmye Norman from [facility] for evaluation of his foot wound.  As best as I can ascertain he is not getting wound care.  He denies claudication or rest pain. he denies fever or chills  No Known Allergies  Current Outpatient Medications  Medication Sig Dispense Refill  . acetaminophen (TYLENOL) 500 MG tablet Take 1,000 mg by mouth 3 (three) times daily.    Marland Kitchen alfuzosin (UROXATRAL) 10 MG 24 hr tablet Take 10 mg by mouth daily with breakfast.    . amLODipine (NORVASC) 5 MG tablet Take 5 mg by mouth daily.    Marland Kitchen aspirin EC 81 MG tablet Take 81 mg by mouth daily.    Marland Kitchen atorvastatin (LIPITOR) 20 MG tablet Take 20 mg by mouth daily.     . clopidogrel (PLAVIX) 75 MG tablet Take 1 tablet (75 mg total) by mouth daily with breakfast. 30 tablet 6  . donepezil (ARICEPT) 10 MG tablet Take 10 mg by mouth at bedtime.    . fexofenadine (ALLEGRA) 60 MG tablet Take 60 mg by mouth 2 (two) times daily as needed for allergies or rhinitis.    Marland Kitchen gabapentin (NEURONTIN) 800 MG tablet Take 800 mg by mouth at bedtime.    Marland Kitchen glipiZIDE (GLUCOTROL XL) 5 MG 24 hr tablet Take 5 mg by mouth daily with breakfast.    . losartan (COZAAR) 50 MG tablet Take 50 mg by mouth daily.     . metFORMIN (GLUCOPHAGE) 500 MG tablet Take 500 mg by mouth 2 (two) times daily with a meal.     . oxyCODONE (OXY IR/ROXICODONE) 5 MG immediate release tablet Take 5 mg by mouth 3 (three) times daily as needed for severe pain.    Marland Kitchen rOPINIRole (REQUIP) 0.5 MG tablet Take 0.5 mg by mouth at bedtime.    .  sennosides-docusate sodium (SENOKOT-S) 8.6-50 MG tablet Take 1 tablet by mouth at bedtime.     . traZODone (DESYREL) 100 MG tablet Take 100 mg by mouth at bedtime.     No current facility-administered medications for this visit.     ROS:  See HPI Findings on initial arteriogram dated 12/11/2018: On the left side the common femoral and deep femoral artery are patent.  The proximal superficial femoral artery is patent but then is occluded.  There is reconstitution of the superficial femoral artery in the mid thigh with moderate diffuse disease to the adductor canal.  This long segment occlusion was addressed with balloon angioplasty as described above. Below the stenosis the popliteal artery is patent and there is single-vessel runoff via the peroneal artery.  Physical Exam:  Vitals:   02/12/19 1022  Weight: 156 lb 6.4 oz (70.9 kg)  Height: 5\' 8"  (1.727 m)    Extremities: Examination of left lower extremity reveals a 1.5 x 1 cm ulcer with fibrinous exudate in the base and a small amount of clear drainage.  There is no surrounding cellulitis.  No purulence.  He has Doppler signals of his anterior tibial, peroneal and posterior tibial  arteries.  Dopplerable popliteal pulse.  Femoral pulses 1+ Neuro: Alert and oriented x4     Assessment/Plan:  This is a 64 y.o. male who is s/p: Left SFA balloon angioplasty.  Small ulcer at the base of the second and third toes without surrounding cellulitis.  Damp to dry saline dressing applied.  I explained this would be best to do twice a day.  We will refer him for evaluation by [facility] for home health nursing for wound care   Barbie Banner, PA-C [facility] [phone]  Clinic MD: Donzetta Matters

## 2019-02-12 ENCOUNTER — Other Ambulatory Visit: Payer: Self-pay

## 2019-02-12 ENCOUNTER — Ambulatory Visit (INDEPENDENT_AMBULATORY_CARE_PROVIDER_SITE_OTHER): Payer: Medicare (Managed Care) | Admitting: Physician Assistant

## 2019-02-12 VITALS — BP 120/76 | HR 79 | Temp 98.0°F | Resp 18 | Ht 68.0 in | Wt 156.4 lb

## 2019-02-12 DIAGNOSIS — L97529 Non-pressure chronic ulcer of other part of left foot with unspecified severity: Secondary | ICD-10-CM

## 2019-02-19 ENCOUNTER — Telehealth: Payer: Self-pay

## 2019-02-19 NOTE — Telephone Encounter (Signed)
RN with [facility] called to stop the referral to [facility] since they do their own wound care.  I advised her that we did try to initiate the referral, per Risa Grill PA's note on Encounter form/ Charge sheet.   We will disregard this referral.  Thurston Hole., LPN

## 2019-03-12 ENCOUNTER — Other Ambulatory Visit: Payer: Self-pay

## 2019-03-12 ENCOUNTER — Encounter (HOSPITAL_BASED_OUTPATIENT_CLINIC_OR_DEPARTMENT_OTHER): Payer: Medicare (Managed Care) | Attending: Internal Medicine | Admitting: Internal Medicine

## 2019-03-12 DIAGNOSIS — Z8546 Personal history of malignant neoplasm of prostate: Secondary | ICD-10-CM | POA: Insufficient documentation

## 2019-03-12 DIAGNOSIS — F17200 Nicotine dependence, unspecified, uncomplicated: Secondary | ICD-10-CM | POA: Diagnosis not present

## 2019-03-12 DIAGNOSIS — E1151 Type 2 diabetes mellitus with diabetic peripheral angiopathy without gangrene: Secondary | ICD-10-CM | POA: Insufficient documentation

## 2019-03-12 DIAGNOSIS — L97522 Non-pressure chronic ulcer of other part of left foot with fat layer exposed: Secondary | ICD-10-CM | POA: Diagnosis not present

## 2019-03-12 DIAGNOSIS — E11621 Type 2 diabetes mellitus with foot ulcer: Secondary | ICD-10-CM | POA: Insufficient documentation

## 2019-03-12 DIAGNOSIS — Z8673 Personal history of transient ischemic attack (TIA), and cerebral infarction without residual deficits: Secondary | ICD-10-CM | POA: Diagnosis not present

## 2019-03-15 NOTE — Progress Notes (Addendum)
Billy Torres, Billy Torres ([id number]) Visit Report for 03/12/2019 Allergy List Details Patient Name: Date of Service: Billy Torres, Billy Torres 03/12/2019 2:45 PM Medical Record [id number] Patient Account Number: [id number] Date of Birth/Sex: Treating RN: [birth year] (64 y.o. Janyth Contes Primary Care Shamel Galyean: Dorian Pod Other Clinician: Referring Kaisa Wofford: Treating Robben Jagiello/Extender:Robson, Dulcy Fanny, JULIE Weeks in Treatment: 0 Allergies Active Allergies No Known Drug Allergies Allergy Notes Electronic Signature(s) Signed: 03/15/2019 5:59:01 PM By: Levan Hurst RN, BSN Entered By: Levan Hurst on 03/12/2019 15:43:27 -------------------------------------------------------------------------------- Arrival Information Details Patient Name: Date of Service: Billy Torres 03/12/2019 2:45 PM Medical Record [id number] Patient Account Number: [id number] Date of Birth/Sex: Treating RN: Mar 22, 1955 (64 y.o. Marvis Repress Primary Care Daune Divirgilio: Dorian Pod Other Clinician: Referring Alie Moudy: Treating Adamarys Shall/Extender:Robson, Dulcy Fanny, Achille Rich in Treatment: 0 Visit Information Patient Arrived: Walker Arrival Time: 15:31 Accompanied By: self Transfer Assistance: None Patient Identification Verified: Yes Secondary Verification Process Yes Completed: Patient Requires Transmission-Based No Precautions: Patient Has Alerts: Yes Patient Alerts: Patient on Blood Thinner L ABI = 1.22 TBI = 0.55 (01/2019) Electronic Signature(s) Signed: 03/15/2019 5:59:01 PM By: Levan Hurst RN, BSN Entered By: Levan Hurst on 03/12/2019 15:43:07 -------------------------------------------------------------------------------- Clinic Level of Care Assessment Details Patient Name: Date of Service: Billy Torres, Billy Torres 03/12/2019 2:45 PM Medical Record [id number] Patient Account Number: [id number] Date of Birth/Sex: Treating RN: 1955/12/08 (64 y.o. Ernestene Mention Primary Care Simuel Stebner: Dorian Pod Other Clinician: Referring Chelsa Stout: Treating Jabree Rebert/Extender:Robson, Dulcy Fanny, JULIE Weeks in Treatment: 0 Clinic Level of Care Assessment Items TOOL 1 Quantity Score []  - Use when EandM and Procedure is performed on INITIAL visit 0 ASSESSMENTS - Nursing Assessment / Reassessment X - General Physical Exam (combine w/ comprehensive assessment (listed just below) 1 20 when performed on new pt. evals) X - Comprehensive Assessment (HX, ROS, Risk Assessments, Wounds Hx, etc.) 1 25 ASSESSMENTS - Wound and Skin Assessment / Reassessment []  - Dermatologic / Skin Assessment (not related to wound area) 0 ASSESSMENTS - Ostomy and/or Continence Assessment and Care []  - Incontinence Assessment and Management 0 []  - Ostomy Care Assessment and Management (repouching, etc.) 0 PROCESS - Coordination of Care X - Simple Patient / Family Education for ongoing care 1 15 []  - Complex (extensive) Patient / Family Education for ongoing care 0 X - Staff obtains Programmer, systems, Records, Test Results / Process Orders 1 10 X - Staff telephones HHA, Nursing Homes / Clarify orders / etc 1 10 []  - Routine Transfer to another Facility (non-emergent condition) 0 []  - Routine Hospital Admission (non-emergent condition) 0 X - New Admissions / Biomedical engineer / Ordering NPWT, Apligraf, etc. 1 15 []  - Emergency Hospital Admission (emergent condition) 0 PROCESS - Special Needs []  - Pediatric / Minor Patient Management 0 []  - Isolation Patient Management 0 []  - Hearing / Language / Visual special needs 0 []  - Assessment of Community assistance (transportation, D/C planning, etc.) 0 []  - Additional assistance / Altered mentation 0 []  - Support Surface(s) Assessment (bed, cushion, seat, etc.) 0 INTERVENTIONS - Miscellaneous []  - External ear exam 0 []  - Patient Transfer (multiple staff / Civil Service fast streamer / Similar devices) 0 []  - Simple Staple / Suture  removal (25 or less) 0 []  - Complex Staple / Suture removal (26 or more) 0 []  - Hypo/Hyperglycemic Management (do not check if billed separately) 0 []  - Ankle / Brachial Index (ABI) - do not check if billed separately 0 Has the patient been seen at the hospital within the last three years: Yes  Total Score: 95 Level Of Care: New/Established - Level 3 Electronic Signature(s) Signed: 03/12/2019 5:33:53 PM By: Baruch Gouty RN, BSN Entered By: Baruch Gouty on 03/12/2019 16:29:59 -------------------------------------------------------------------------------- Encounter Discharge Information Details Patient Name: Date of Service: Billy Torres 03/12/2019 2:45 PM Medical Record [id number] Patient Account Number: [id number] Date of Birth/Sex: Treating RN: September 19, 1955 (64 y.o. Hessie Diener Primary Care Jameson Morrow: Dorian Pod Other Clinician: Referring Tykeem Lanzer: Treating Saralee Bolick/Extender:Robson, Dulcy Fanny, JULIE Weeks in Treatment: 0 Encounter Discharge Information Items Post Procedure Vitals Discharge Condition: Stable Temperature (F): 98.3 Ambulatory Status: Walker Pulse (bpm): 67 Discharge Destination: Home Respiratory Rate (breaths/min): 18 Transportation: Private Auto Blood Pressure (mmHg): 113/61 Accompanied By: self Schedule Follow-up Appointment: Yes Clinical Summary of Care: Electronic Signature(s) Signed: 03/12/2019 5:34:19 PM By: Deon Pilling Entered By: Deon Pilling on 03/12/2019 17:28:21 -------------------------------------------------------------------------------- Lower Extremity Assessment Details Patient Name: Date of Service: Billy Torres, Billy Torres 03/12/2019 2:45 PM Medical Record [id number] Patient Account Number: [id number] Date of Birth/Sex: Treating RN: [birth year] (64 y.o. Janyth Contes Primary Care Allisen Pidgeon: Dorian Pod Other Clinician: Referring Shantele Reller: Treating Gurpreet Mariani/Extender:Robson, Dulcy Fanny, JULIE Weeks  in Treatment: 0 Edema Assessment Assessed: [Left: No] [Right: No] E[Left: dema] [Right: :] Calf Left: Right: Point of Measurement: 31 cm From Medial Instep 32 cm cm Ankle Left: Right: Point of Measurement: 11 cm From Medial Instep 22.2 cm cm Vascular Assessment Pulses: Dorsalis Pedis Palpable: [Left:No] Electronic Signature(s) Signed: 03/15/2019 5:59:01 PM By: Levan Hurst RN, BSN Entered By: Levan Hurst on 03/12/2019 15:56:24 -------------------------------------------------------------------------------- Multi Wound Chart Details Patient Name: Date of Service: Billy Torres 03/12/2019 2:45 PM Medical Record [id number] Patient Account Number: [id number] Date of Birth/Sex: Treating RN: [birth year] (64 y.o. 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] [2:Lower Extremity] Comorbid History: [1:Hypertension, Peripheral Hypertension, Peripheral N/A Arterial Disease, Peripheral Arterial Disease, Peripheral Venous Disease, Type II Venous Disease, Type II Diabetes, Osteoarthritis] [2:Diabetes, Osteoarthritis] Date Acquired: [1:11/08/2018] [2:11/08/2018] [N/A:N/A] Weeks of Treatment: [1:0] [2:0] [N/A:N/A] Wound Status: [1:Open] [2:Open] [N/A:N/A] Measurements L x W x D 1.5x1x1.1 [2:0.2x0.4x0.2] [N/A:N/A] (cm) Area (cm) : [1:1.178] [2:0.063] [N/A:N/A] Volume (cm) : [1:1.296]  [2:0.013] [N/A:N/A] Classification: [1:Grade 2] [2:Unable to visualize wound N/A bed] Exudate Amount: [1:Medium] [2:Small] [N/A:N/A] Exudate Type: [1:Serosanguineous] [2:Serous] [N/A:N/A] Exudate Color: [1:red, brown] [2:amber] [N/A:N/A] Wound Margin: [1:Distinct, outline attached Flat and Intact] [N/A:N/A] Granulation Amount: [1:Medium (34-66%)] [2:None Present (0%)] [N/A:N/A] Granulation Quality: [1:Pink] [2:N/A] [N/A:N/A] Necrotic Amount: [1:Medium (34-66%)] [2:Large (67-100%)] [N/A:N/A] Exposed Structures: [1:Fat Layer (Subcutaneous Fascia: No Tissue) Exposed: Yes Fascia: No Tendon: No Muscle: No Joint: No Bone: No] [2:Fat Layer (Subcutaneous Tissue) Exposed: No Tendon: No Muscle: No Joint: No Bone: No] [N/A:N/A] Epithelialization: [1:None] [2:None] [N/A:N/A] Debridement: [1:Debridement - Excisional N/A] [N/A:N/A] Pre-procedure [1:16:25] [2:N/A] [N/A:N/A] Verification/Time Out Taken: Pain Control: [1:Other] [2:N/A] [N/A:N/A] Tissue Debrided: [1:Subcutaneous, Slough] [2:N/A] [N/A:N/A] Level: [1:Skin/Subcutaneous Tissue N/A] [N/A:N/A] Debridement Area (sq cm):1.5 [2:N/A] [N/A:N/A] Instrument: [1:Curette] [2:N/A] [N/A:N/A] Bleeding: [1:Minimum] [2:N/A] [N/A:N/A] Hemostasis Achieved: [1:Pressure] [2:N/A] [N/A:N/A] Procedural Pain: [1:6] [2:N/A] [N/A:N/A] Post Procedural Pain: [1:5] [2:N/A] [N/A:N/A] Debridement Treatment Procedure was tolerated [2:N/A] [N/A:N/A] Response: [1:well] Post Debridement [1:1.5x1x1.1] [2:N/A] [N/A:N/A] Measurements  L x W x D (cm) Post Debridement [1:1.296] [2:N/A] [N/A:N/A] Volume: (cm) Procedures Performed: [1:Debridement] [2:N/A] [N/A:N/A] Treatment Notes Electronic Signature(s) Signed: 03/12/2019 5:21:33 PM By: Linton Ham MD Signed: 03/15/2019 5:31:36 PM By: Kela Millin Entered By: Linton Ham on 03/12/2019 17:08:31 -------------------------------------------------------------------------------- Multi-Disciplinary Care Plan  Details Patient Name: Date of Service: Billy Torres 03/12/2019 2:45 PM Medical Record [id number] Patient Account Number: [id number] Date of Birth/Sex: Treating RN: 1955-05-03 (64 y.o. Ernestene Mention Primary Care Myrth Dahan: Dorian Pod Other Clinician: Referring Lunden Stieber: Treating Brandis Wixted/Extender:Robson, Dulcy Fanny, JULIE Weeks in Treatment: 0 Active Inactive Abuse / Safety / Falls / Self Care Management Nursing Diagnoses: Potential for falls Goals: Patient will not experience any injury related to falls Date Initiated: 03/12/2019 Target Resolution Date: 04/09/2019 Goal Status: Active Patient/caregiver will verbalize/demonstrate measures taken to prevent injury and/or falls Date Initiated: 03/12/2019 Target Resolution Date: 04/09/2019 Goal Status: Active Interventions: Assess fall risk on admission and as needed Assess impairment of mobility on admission and as needed per policy Notes: Nutrition Nursing Diagnoses: Impaired glucose control: actual or potential Potential for alteratiion in Nutrition/Potential for imbalanced nutrition Goals: Patient/caregiver will maintain therapeutic glucose control Date Initiated: 03/12/2019 Target Resolution Date: 04/09/2019 Goal Status: Active Interventions: Assess HgA1c results as ordered upon admission and as needed Assess patient nutrition upon admission and as needed per policy Provide education on elevated blood sugars and impact on wound healing Treatment Activities: Patient referred to Primary Care Physician for further nutritional evaluation : 03/12/2019 Notes: Wound/Skin Impairment Nursing Diagnoses: Impaired tissue integrity Knowledge deficit related to smoking impact on wound healing Knowledge deficit related to ulceration/compromised skin integrity Goals: Patient will demonstrate a reduced rate of smoking or cessation of smoking Date Initiated: 03/12/2019 Target Resolution Date: 04/09/2019 Goal Status:  Active Patient/caregiver will verbalize understanding of skin care regimen Date Initiated: 03/12/2019 Target Resolution Date: 04/09/2019 Goal Status: Active Ulcer/skin breakdown will have a volume reduction of 30% by week 4 Date Initiated: 03/12/2019 Target Resolution Date: 04/09/2019 Goal Status: Active Interventions: Assess patient/caregiver ability to obtain necessary supplies Assess patient/caregiver ability to perform ulcer/skin care regimen upon admission and as needed Assess ulceration(s) every visit Provide education on smoking Provide education on ulcer and skin care Treatment Activities: Skin care regimen initiated : 03/12/2019 Topical wound management initiated : 03/12/2019 Notes: Electronic Signature(s) Signed: 03/12/2019 5:33:53 PM By: Baruch Gouty RN, BSN Entered By: Baruch Gouty on 03/12/2019 16:28:19 -------------------------------------------------------------------------------- Pain Assessment Details Patient Name: Date of Service: Billy Torres 03/12/2019 2:45 PM Medical Record [id number] Patient Account Number: [id number] Date of Birth/Sex: Treating RN: 21-Nov-1955 (64 y.o. Janyth Contes Primary Care Cristino Degroff: Dorian Pod Other Clinician: Referring Tinsley Everman: Treating Scot Shiraishi/Extender:Robson, Dulcy Fanny, JULIE Weeks in Treatment: 0 Active Problems Location of Pain Severity and Description of Pain Patient Has Paino Yes Site Locations Pain Location: Pain in Ulcers With Dressing Change: Yes Duration of the Pain. Constant / Intermittento Intermittent Rate the pain. Current Pain Level: 6 Worst Pain Level: 10 Least Pain Level: 0 Character of Pain Describe the Pain: Throbbing Pain Management and Medication Current Pain Management: Medication: Yes Cold Application: No Rest: No Massage: No Activity: No T.E.N.S.: No Heat Application: No Leg drop or elevation: No Is the Current Pain Management Adequate: Adequate How does your wound  impact your activities of daily livingo Sleep: No Bathing: No Appetite: No Relationship With Others: No Bladder Continence: No Emotions: No Bowel Continence: No Work: No Toileting: No Drive: No Dressing: No Hobbies: No Electronic Signature(s) Signed: 03/15/2019 5:59:01 PM By: Levan Hurst  RN, BSN Entered By: Levan Hurst on 03/12/2019 16:02:00 -------------------------------------------------------------------------------- Patient/Caregiver Education Details Patient Name: Date of Service: Billy Torres, Billy Torres 3/5/2021andnbsp2:45 PM Medical Record [id number] Patient Account Number: [id number] Date of Birth/Gender: Nov 16, 1955 (63 y.o. M) Treating RN: Baruch Gouty Primary Care Physician: Dorian Pod Other Clinician: Referring Physician: Treating Physician/Extender:Robson, Dulcy Fanny, Achille Rich in Treatment: 0 Education Assessment Education Provided To: Patient Education Topics Provided Elevated Blood Sugar/ Impact on Healing: Handouts: Elevated Blood Sugars: How Do They Affect Wound Healing Methods: Explain/Verbal, Printed Responses: Reinforcements needed, State content correctly Smoking and Wound Healing: Methods: Explain/Verbal Responses: Reinforcements needed, State content correctly Welcome To [facility]: Handouts: Welcome To [facility] Methods: Explain/Verbal, Printed Responses: Reinforcements needed, State content correctly Wound/Skin Impairment: Handouts: Caring for Your Ulcer, Skin Care Do's and Dont's, Smoking and Wound Healing Methods: Explain/Verbal, Printed Responses: Reinforcements needed, State content correctly Electronic Signature(s) Signed: 03/12/2019 5:33:53 PM By: Baruch Gouty RN, BSN Entered By: Baruch Gouty on 03/12/2019 16:29:08 -------------------------------------------------------------------------------- Wound Assessment Details Patient Name: Date of Service: Billy Torres 03/12/2019 2:45  PM Medical Record [id number] Patient Account Number: [id number] Date of Birth/Sex: Treating RN: [birth year] (65 y.o. Marvis Repress Primary Care Antonae Zbikowski: Dorian Pod Other Clinician: Referring Bama Hanselman: Treating Layliana Devins/Extender:Robson, Dulcy Fanny, JULIE Weeks in Treatment: 0 Wound Status Wound Number: 1 Primary Diabetic Wound/Ulcer of the Lower Extremity Etiology: Wound Location: Left Foot - Dorsal Wound Open Wounding Event: Gradually Appeared Status: Date Acquired: 11/08/2018 Comorbid Hypertension, Peripheral Arterial Disease, Weeks Of Treatment: 0 History: Peripheral Venous Disease, Type II Diabetes, Clustered Wound: No Osteoarthritis Photos Wound Measurements Length: (cm) 1.5 % Reduc Width: (cm) 1 % Reduc Depth: (cm) 1.1 Epithel Area: (cm) 1.178 Tunnel Volume: (cm) 1.296 Underm Wound Description Classification: Grade 2 Wound Margin: Distinct, outline attached Exudate Amount: Medium Exudate Type: Serosanguineous Exudate Color: red, brown Wound Bed Granulation Amount: Medium (34-66%) Granulation Quality: Pink Necrotic Amount: Medium (34-66%) Necrotic Quality: Adherent Slough Foul Odor After Cleansing: No Slough/Fibrino Yes Exposed Structure Fascia Exposed: No Fat Layer (Subcutaneous Tissue) Exposed: Yes Tendon Exposed: No Muscle Exposed: No Joint Exposed: No Bone Exposed: No tion in Area: 0% tion in Volume: 0% ialization: None ing: No ining: No Treatment Notes Wound #1 (Left, Dorsal Foot) 1. Cleanse With Wound Cleanser 3. Primary Dressing Applied Santyl Other primary dressing (specifiy in notes) 4. Secondary Dressing Dry Gauze Roll Gauze 5. Secured With Medipore tape Notes primary saline moisten gauze over santyl. netting. explained the orders, dressing, frequency of change, and when return to wound center. Electronic Signature(s) Signed: 03/16/2019 3:29:05 PM By: Mikeal Hawthorne EMT/HBOT Signed: 03/16/2019 5:46:17 PM By:  Kela Millin Previous Signature: 03/15/2019 5:59:01 PM Version By: Levan Hurst RN, BSN Entered By: Mikeal Hawthorne on 03/16/2019 14:16:08 -------------------------------------------------------------------------------- Wound Assessment Details Patient Name: Date of Service: Billy Torres 03/12/2019 2:45 PM Medical Record [id number] Patient Account Number: [id number] Date of Birth/Sex: Treating RN: 07/29/1955 (64 y.o. Marvis Repress Primary Care Iveliz Garay: Dorian Pod Other Clinician: Referring Jamell Laymon: Treating Delaine Hernandez/Extender:Robson, Dulcy Fanny, JULIE Weeks in Treatment: 0 Wound Status Wound Number: 2 Primary Diabetic Wound/Ulcer of the Lower Extremity Etiology: Wound Location: Left Toe Fourth - Dorsal Wound Open Wounding Event: Gradually Appeared Status: Date Acquired: 11/08/2018 Comorbid Hypertension, Peripheral Arterial Disease, Weeks Of Treatment: 0 History: Peripheral Venous Disease, Type II Diabetes, Clustered Wound: No Osteoarthritis Photos Wound Measurements Length: (cm) 0.2 % Reduction in Ar Width: (cm) 0.4 % Reduction in Vo Depth: (cm) 0.2 Epithelialization Area: (cm) 0.063 Tunneling: Volume: (cm) 0.013 Undermining: Wound  Description Classification: Unable to visualize wound bed Wound Margin: Flat and Intact Exudate Amount: Small Exudate Type: Serous Exudate Color: amber Wound Bed Granulation Amount: None Present (0%) Necrotic Amount: Large (67-100%) Fascia Exposed: Necrotic Quality: Adherent Slough Fat Layer (Subcut Tendon Exposed: Muscle Exposed: Joint Exposed: Bone Exposed: Exposed Structure No aneous Tissue) Exposed: No No No No No ea: 0% lume: 0% : None No No Treatment Notes Wound #2 (Left, Dorsal Toe Fourth) 1. Cleanse With Wound Cleanser 3. Primary Dressing Applied Santyl Other primary dressing (specifiy in notes) 4. Secondary Dressing Dry Gauze Roll Gauze 5. Secured With Medipore  tape Notes primary saline moisten gauze over santyl. netting. explained the orders, dressing, frequency of change, and when return to wound center. Electronic Signature(s) Signed: 03/16/2019 3:29:05 PM By: Mikeal Hawthorne EMT/HBOT Signed: 03/16/2019 5:46:17 PM By: Kela Millin Previous Signature: 03/15/2019 5:59:01 PM Version By: Levan Hurst RN, BSN Entered By: Mikeal Hawthorne on 03/16/2019 14:15:40 -------------------------------------------------------------------------------- Vitals Details Patient Name: Date of Service: Billy Torres 03/12/2019 2:45 PM Medical Record [id number] Patient Account Number: [id number] Date of Birth/Sex: Treating RN: 1955/05/18 (64 y.o. Marvis Repress Primary Care Jatniel Verastegui: Dorian Pod Other Clinician: Referring Refugio Vandevoorde: Treating Giles Currie/Extender:Robson, Dulcy Fanny, JULIE Weeks in Treatment: 0 Vital Signs Time Taken: 15:36 Temperature (F): 98.3 Height (in): 68 Pulse (bpm): 67 Source: Stated Respiratory Rate (breaths/min): 18 Weight (lbs): 160 Blood Pressure (mmHg): 113/61 Source: Stated Reference Range: 80 - 120 mg / dl Body Mass Index (BMI): 24.3 Electronic Signature(s) Signed: 03/15/2019 5:59:01 PM By: Levan Hurst RN, BSN Entered By: Levan Hurst on 03/12/2019 15:43:17

## 2019-03-15 NOTE — Progress Notes (Signed)
Billy Torres ([id number]) Visit Report for 03/12/2019 Debridement Details Patient Name: Date of Service: Billy Torres 03/12/2019 2:45 PM Medical Record [id number] Patient Account Number: [id number] Date of Birth/Sex: Treating RN: May 02, 1955 (64 y.o. Marvis Repress Primary Care Provider: Dorian Pod Other Clinician: Referring Provider: Treating Provider/Extender:Robson, Dulcy Fanny, JULIE Weeks in Treatment: 0 Debridement Performed for Wound #1 Left,Dorsal Foot Assessment: Performed By: Physician Ricard Dillon., MD Debridement Type: Debridement Severity of Tissue Pre Fat layer exposed Debridement: Level of Consciousness (Pre- Awake and Alert procedure): Pre-procedure Verification/Time Out Taken: Yes - 16:25 Start Time: 16:26 Pain Control: Other : benzocaine 20% spray Total Area Debrided (L x W): 1.5 (cm) x 1 (cm) = 1.5 (cm) Tissue and other material Viable, Non-Viable, Slough, Subcutaneous, Slough debrided: Level: Skin/Subcutaneous Tissue Debridement Description: Excisional Instrument: Curette Bleeding: Minimum Hemostasis Achieved: Pressure End Time: 16:30 Procedural Pain: 6 Post Procedural Pain: 5 Response to Treatment: Procedure was tolerated well Level of Consciousness Awake and Alert (Post-procedure): Post Debridement Measurements of Total Wound Length: (cm) 1.5 Width: (cm) 1 Depth: (cm) 1.1 Volume: (cm) 1.296 Character of Wound/Ulcer Post Improved Debridement: Severity of Tissue Post Debridement: Fat layer exposed Post Procedure Diagnosis Same as Pre-procedure Electronic Signature(s) Signed: 03/12/2019 5:21:33 PM By: Linton Ham MD Signed: 03/15/2019 5:31:36 PM By: Kela Millin Entered By: Linton Ham on 03/12/2019 17:08:44 -------------------------------------------------------------------------------- HPI Details Patient Name: Date of Service: Billy Torres 03/12/2019 2:45 PM Medical Record  [id number] Patient Account Number: [id number] Date of Birth/Sex: Treating RN: [birth year] (64 y.o. Marvis Repress Primary Care Provider: Dorian Pod Other Clinician: Referring Provider: Treating Provider/Extender:Robson, Dulcy Fanny, Achille Rich in Treatment: 0 History of Present Illness HPI Description: ADMISSION 03/12/2019 This is a 64 year old man with type 2 diabetes on oral agents. He has had a wound on the left dorsal foot just proximal to the base of the third and fourth toes since at least November he underwent a angiogram on 12/11/2018 by vein and vascular. He underwent stenting of the left SFA. Noted to have single-vessel runoff via the peroneal artery which is included in the distal leg. I do not think they was felt that he had an additional procedure option. He was recently seen by vascular on 02/12/2019 noted to have a 1.5 x 1 cm ulcer and suggested wet-to-dry twice daily. He actually has another ulcer on the dorsal aspect of the left fourth toe at the base. An MRI of the foot done on 12/07/2018 did not show evidence of osteomyelitis The patient had repeat ABIs on 01/27/2019. On the right he was noncompressible with a TBI of 0.4 monophasic waveforms. On the left ABI of 1.22 with a TBI of 0.55. This is a marked improvement from previous TBI at which time it was 0.43 and before that 0.16 I spoke with Dr. Jimmye Norman who is the patient's primary physician. They change the dressing to Santyl earlier this week. That is appropriate. The patient ranks his pain at a 4 out of 10. We are asked about hyperbaric oxygen therapy. Past medical history; type 2 diabetes, prostate CA, history of stroke, chronic venous disease, he is a smoker. Electronic Signature(s) Signed: 03/12/2019 5:21:33 PM By: Linton Ham MD Entered By: Linton Ham on 03/12/2019 17:15:42 -------------------------------------------------------------------------------- Physical Exam Details Patient  Name: Date of Service: Billy Torres 03/12/2019 2:45 PM Medical Record [id number] Patient Account Number: [id number] Date of Birth/Sex: Treating RN: [birth year] (64 y.o. Marvis Repress Primary Care Provider: Dorian Pod Other Clinician: Referring Provider: Treating Provider/Extender:Robson, Dulcy Fanny, JULIE  Weeks in Treatment: 0 Constitutional Sitting or standing Blood Pressure is within target range for patient.. Pulse regular and within target range for patient.Marland Kitchen Respirations regular, non-labored and within target range.. Temperature is normal and within the target range for the patient.Marland Kitchen Appears in no distress. Eyes Conjunctivae clear. No discharge.no icterus. Respiratory work of breathing is normal. Cardiovascular Double pulses at the left popliteal and left femoral. Pedal pulses were absent in the left foot. His foot is not particularly cold but has very poor capillary refill time. Lymphatic Palpable in the left popliteal or inguinal area. Integumentary (Hair, Skin) No primary cutaneous disorders. Neurological Light touch to the microfilament was actually normal perhaps some mild reduction in vibration sense. No major neuropathic findings. Psychiatric appears at normal baseline. Notes Wound exam; the patient has a deep punched out area at the dorsal base of the left third toe. This had some debris at the bottom of the wound which was debrided with a #3 curette overall I am able to get to some decent looking tissue that appears to have some bleeding. Overall the situation seems somewhat better than what the picture looks like from last month at vein and vascular. Also has a small wound at the dorsal aspect of the left fourth toe. Electronic Signature(s) Signed: 03/12/2019 5:21:33 PM By: Linton Ham MD Entered By: Linton Ham on 03/12/2019 17:14:26 -------------------------------------------------------------------------------- Physician  Orders Details Patient Name: Date of Service: Billy Torres 03/12/2019 2:45 PM Medical Record [id number] Patient Account Number: [id number] Date of Birth/Sex: Treating RN: July 25, 1955 (64 y.o. Ernestene Mention Primary Care Provider: Dorian Pod Other Clinician: Referring Provider: Treating Provider/Extender:Robson, Dulcy Fanny, JULIE Weeks in Treatment: 0 Verbal / Phone Orders: No Diagnosis Coding Follow-up Appointments Return Appointment in 1 week. Dressing Change Frequency Wound #1 Left,Dorsal Foot Change dressing every day. - may leave current dressing on until Monday when santyl available Wound #2 Left,Dorsal Toe Fourth Change dressing every day. - may leave current dressing on until Monday when santyl available Wound Cleansing Wound #1 Left,Dorsal Foot Clean wound with Wound Cleanser Wound #2 Left,Dorsal Toe Fourth Clean wound with Wound Cleanser Primary Wound Dressing Wound #1 Left,Dorsal Foot Santyl Ointment Wound #2 Left,Dorsal Toe Fourth Santyl Ointment Secondary Dressing Kerlix/Rolled Gauze Dry Gauze Other: - saline moistened gauze over santyl [facility] for wound care. - [facility] Patient Medications Allergies: No Known Drug Allergies Notifications Medication Indication Start End Santyl 03/12/2019 DOSE topical 250 unit/gram ointment - ointment topical apply to foot wounds daily Electronic Signature(s) Signed: 03/12/2019 5:21:33 PM By: Linton Ham MD Signed: 03/12/2019 5:33:53 PM By: Baruch Gouty RN, BSN Entered By: Baruch Gouty on 03/12/2019 16:36:27 -------------------------------------------------------------------------------- Prescription 03/12/2019 Patient Name: Billy Torres. Provider: Linton Ham MD Date of Birth: 01-Oct-1955 NPI#: [id number] Sex: Jerilynn Mages DEA#: [id number] Phone #: [phone] License #: [id number] Patient Address: [address] [place], [place] [address] [place], [place] [address] [phone] Allergies No Known Drug Allergies Medication Medication: Route: Strength: Form: Santyl topical 250 unit/gram ointment Class: TOPICAL/MUCOUS MEMBR./SUBCUT. ENZYMES Dose: Frequency / Time: Indication: ointment topical apply to foot wounds daily Number of Refills: Number of Units: 2 Thirty (30) Gram(s) Generic Substitution: Start Date: End Date: Administered at Substitution Permitted [id number] Facility: No Note to Pharmacy: Signature(s): Date(s): Electronic Signature(s) Signed: 03/12/2019 5:21:33 PM By: Linton Ham MD Signed: 03/12/2019 5:33:53 PM By: Baruch Gouty RN, BSN Entered By: Baruch Gouty on 03/12/2019 16:36:27 --------------------------------------------------------------------------------  Problem List Details Patient Name: Date of Service: [place], [place] 03/12/2019 2:45 PM Medical Record [id number] Patient Account Number: [id number] Date of Birth/Sex: Treating RN: [birth year] (64 y.o. Marvis Repress Primary Care Provider: Dorian Pod Other Clinician: Referring Provider: Treating Provider/Extender:Robson, Dulcy Fanny, Achille Rich in Treatment: 0 Active Problems ICD-10 Evaluated Encounter Code Description Active Date Today Diagnosis E11.621 Type 2 diabetes mellitus with foot ulcer 03/12/2019 No Yes E11.51 Type 2 diabetes mellitus with diabetic peripheral 03/12/2019 No Yes angiopathy without gangrene L97.522 Non-pressure chronic ulcer of other part of left foot 03/12/2019 No Yes with fat layer exposed Inactive Problems Resolved Problems Electronic Signature(s) Signed: 03/12/2019 5:21:33 PM By: Linton Ham MD Entered By: Linton Ham on 03/12/2019 17:07:32 -------------------------------------------------------------------------------- Progress Note Details Patient Name: Date of Service: Billy Torres 03/12/2019 2:45 PM Medical  Record [id number] Patient Account Number: [id number] Date of Birth/Sex: Treating RN: 1955-05-23 (64 y.o. Marvis Repress Primary Care Provider: Dorian Pod Other Clinician: Referring Provider: Treating Provider/Extender:Robson, Dulcy Fanny, JULIE Weeks in Treatment: 0 Subjective History of Present Illness (HPI) ADMISSION 03/12/2019 This is a 64 year old man with type 2 diabetes on oral agents. He has had a wound on the left dorsal foot just proximal to the base of the third and fourth toes since at least November he underwent a angiogram on 12/11/2018 by vein and vascular. He underwent stenting of the left SFA. Noted to have single-vessel runoff via the peroneal artery which is included in the distal leg. I do not think they was felt that he had an additional procedure option. He was recently seen by vascular on 02/12/2019 noted to have a 1.5 x 1 cm ulcer and suggested wet-to-dry twice daily. He actually has another ulcer on the dorsal aspect of the left fourth toe at the base. An MRI of the foot done on 12/07/2018 did not show evidence of osteomyelitis The patient had repeat ABIs on 01/27/2019. On the right he was noncompressible with a TBI of 0.4 monophasic waveforms. On the left ABI of 1.22 with a TBI of 0.55. This is a marked improvement from previous TBI at which time it was 0.43 and before that 0.16 I spoke with Dr. Jimmye Norman who is the patient's primary physician. They change the dressing to Santyl earlier this week. That is appropriate. The patient ranks his pain at a 4 out of 10. We are asked about hyperbaric oxygen therapy. Past medical history; type 2 diabetes, prostate CA, history of stroke, chronic venous disease, he is a smoker. Patient History Information obtained from Patient. Allergies No Known Drug Allergies Family History Unknown History. Social History Current every day smoker - 3-4 cigarettes per day, Marital Status - Divorced, Alcohol Use - Never,  Drug Use - No History, Caffeine Use - Moderate - Coffee, soda. Medical History Cardiovascular Patient has history of Hypertension, Peripheral Arterial Disease, Peripheral Venous Disease Endocrine Patient has history of Type II Diabetes Musculoskeletal Patient has history of Osteoarthritis Patient is treated with Oral Agents. Blood sugar is not tested. Medical And Surgical History Notes Cardiovascular CVA 4 years ago, Hyperlidpidemia Genitourinary Hx of prostate cancer Musculoskeletal l Neurologic left side hemiparesis Review of Systems (ROS) Constitutional Symptoms (General Health) Denies complaints or symptoms of Fatigue, Fever, Chills, Marked Weight Change. Eyes Denies complaints or symptoms of Dry Eyes, Vision Changes, Glasses / Contacts. Ear/Nose/Mouth/Throat Denies complaints or symptoms of Chronic sinus problems or rhinitis. Respiratory Denies complaints or symptoms of Chronic or frequent coughs, Shortness of Breath. Gastrointestinal Denies complaints or symptoms of Frequent  diarrhea, Nausea, Vomiting. Genitourinary Denies complaints or symptoms of Frequent urination. Integumentary (Skin) Complains or has symptoms of Wounds - 2 wounds on left foot. Musculoskeletal Complains or has symptoms of Muscle Weakness. Neurologic Complains or has symptoms of Numbness/parasthesias. Psychiatric Denies complaints or symptoms of Claustrophobia, Suicidal. Objective Constitutional Sitting or standing Blood Pressure is within target range for patient.. Pulse regular and within target range for patient.Marland Kitchen Respirations regular, non-labored and within target range.. Temperature is normal and within the target range for the patient.Marland Kitchen Appears in no distress. Vitals Time Taken: 3:36 PM, Height: 68 in, Source: Stated, Weight: 160 lbs, Source: Stated, BMI: 24.3, Temperature: 98.3 F, Pulse: 67 bpm, Respiratory Rate: 18 breaths/min, Blood Pressure: 113/61 mmHg. Eyes Conjunctivae clear. No  discharge.no icterus. Respiratory work of breathing is normal. Cardiovascular Double pulses at the left popliteal and left femoral. Pedal pulses were absent in the left foot. His foot is not particularly cold but has very poor capillary refill time. Lymphatic Palpable in the left popliteal or inguinal area. Neurological Light touch to the microfilament was actually normal perhaps some mild reduction in vibration sense. No major neuropathic findings. Psychiatric appears at normal baseline. General Notes: Wound exam; the patient has a deep punched out area at the dorsal base of the left third toe. This had some debris at the bottom of the wound which was debrided with a #3 curette overall I am able to get to some decent looking tissue that appears to have some bleeding. Overall the situation seems somewhat better than what the picture looks like from last month at vein and vascular. ooAlso has a small wound at the dorsal aspect of the left fourth toe. Integumentary (Hair, Skin) No primary cutaneous disorders. Wound #1 status is Open. Original cause of wound was Gradually Appeared. The wound is located on the Left,Dorsal Foot. The wound measures 1.5cm length x 1cm width x 1.1cm depth; 1.178cm^2 area and 1.296cm^3 volume. There is Fat Layer (Subcutaneous Tissue) Exposed exposed. There is no tunneling or undermining noted. There is a medium amount of serosanguineous drainage noted. The wound margin is distinct with the outline attached to the wound base. There is medium (34-66%) pink granulation within the wound bed. There is a medium (34-66%) amount of necrotic tissue within the wound bed including Adherent Slough. Wound #2 status is Open. Original cause of wound was Gradually Appeared. The wound is located on the Left,Dorsal Toe Fourth. The wound measures 0.2cm length x 0.4cm width x 0.2cm depth; 0.063cm^2 area and 0.013cm^3 volume. There is no tunneling or undermining noted. There is a  small amount of serous drainage noted. The wound margin is flat and intact. There is no granulation within the wound bed. There is a large (67-100%) amount of necrotic tissue within the wound bed including Adherent Slough. Assessment Active Problems ICD-10 Type 2 diabetes mellitus with foot ulcer Type 2 diabetes mellitus with diabetic peripheral angiopathy without gangrene Non-pressure chronic ulcer of other part of left foot with fat layer exposed Procedures Wound #1 Pre-procedure diagnosis of Wound #1 is a Diabetic Wound/Ulcer of the Lower Extremity located on the Left,Dorsal Foot .Severity of Tissue Pre Debridement is: Fat layer exposed. There was a Excisional Skin/Subcutaneous Tissue Debridement with a total area of 1.5 sq cm performed by Ricard Dillon., MD. With the following instrument(s): Curette to remove Viable and Non-Viable tissue/material. Material removed includes Subcutaneous Tissue and Slough and after achieving pain control using Other (benzocaine 20% spray). No specimens were taken. A time out was  conducted at 16:25, prior to the start of the procedure. A Minimum amount of bleeding was controlled with Pressure. The procedure was tolerated well with a pain level of 6 throughout and a pain level of 5 following the procedure. Post Debridement Measurements: 1.5cm length x 1cm width x 1.1cm depth; 1.296cm^3 volume. Character of Wound/Ulcer Post Debridement is improved. Severity of Tissue Post Debridement is: Fat layer exposed. Post procedure Diagnosis Wound #1: Same as Pre-Procedure Plan Follow-up Appointments: Return Appointment in 1 week. Dressing Change Frequency: Wound #1 Left,Dorsal Foot: Change dressing every day. - may leave current dressing on until Monday when santyl available Wound #2 Left,Dorsal Toe Fourth: Change dressing every day. - may leave current dressing on until Monday when santyl available Wound Cleansing: Wound #1 Left,Dorsal Foot: Clean wound  with Wound Cleanser Wound #2 Left,Dorsal Toe Fourth: Clean wound with Wound Cleanser Primary Wound Dressing: Wound #1 Left,Dorsal Foot: Santyl Ointment Wound #2 Left,Dorsal Toe Fourth: Santyl Ointment Secondary Dressing: Kerlix/Rolled Gauze Dry Gauze Other: - saline moistened gauze over santyl Home Health: [facility] for wound care. [facility] The following medication(s) was prescribed: Santyl topical 250 unit/gram ointment ointment topical apply to foot wounds daily starting 03/12/2019 1. I agree with Santyl to both wounds 2. On the larger wound backing wet-to-dry dressing 3. With regards to hyperbaric oxygen ischemic wounds even in a diabetic do not qualify for HBO under current Medicare guidelines even though most physicians would certainly want this treatment. He does not have evidence of underlying osteomyelitis which would make this a Wagner 3 wound. He does not have dry gangrene 4. His pain is currently a 4 out of 10 I do not think there is any reason to be more aggressive with consideration of amputation however. 5. If we can get the surface of the wound cleaned up then he might be a candidate for a endoform-based dressing or even advanced treatment product Electronic Signature(s) Signed: 03/12/2019 5:21:33 PM By: Linton Ham MD Entered By: Linton Ham on 03/12/2019 17:17:27 -------------------------------------------------------------------------------- HxROS Details Patient Name: Date of Service: Billy Torres 03/12/2019 2:45 PM Medical Record [id number] Patient Account Number: [id number] Date of Birth/Sex: Treating RN: January 01, 1956 (64 y.o. Janyth Contes Primary Care Provider: Dorian Pod Other Clinician: Referring Provider: Treating Provider/Extender:Robson, Dulcy Fanny, JULIE Weeks in Treatment: 0 Information Obtained From Patient Constitutional Symptoms (General Health) Complaints and Symptoms: Negative for:  Fatigue; Fever; Chills; Marked Weight Change Eyes Complaints and Symptoms: Negative for: Dry Eyes; Vision Changes; Glasses / Contacts Ear/Nose/Mouth/Throat Complaints and Symptoms: Negative for: Chronic sinus problems or rhinitis Respiratory Complaints and Symptoms: Negative for: Chronic or frequent coughs; Shortness of Breath Gastrointestinal Complaints and Symptoms: Negative for: Frequent diarrhea; Nausea; Vomiting Genitourinary Complaints and Symptoms: Negative for: Frequent urination Medical History: Past Medical History Notes: Hx of prostate cancer Integumentary (Skin) Complaints and Symptoms: Positive for: Wounds - 2 wounds on left foot Musculoskeletal Complaints and Symptoms: Positive for: Muscle Weakness Medical History: Positive for: Osteoarthritis Past Medical History Notes: l Neurologic Complaints and Symptoms: Positive for: Numbness/parasthesias Medical History: Past Medical History Notes: left side hemiparesis Psychiatric Complaints and Symptoms: Negative for: Claustrophobia; Suicidal Hematologic/Lymphatic Cardiovascular Medical History: Positive for: Hypertension; Peripheral Arterial Disease; Peripheral Venous Disease Past Medical History Notes: CVA 4 years ago, Hyperlidpidemia Endocrine Medical History: Positive for: Type II Diabetes Time with diabetes: over 5 years Treated with: Oral agents Blood sugar tested every day: No Immunological Oncologic Immunizations Pneumococcal Vaccine: Received Pneumococcal Vaccination: Yes Implantable Devices None Family and Social  History Unknown History: Yes; Current every day smoker - 3-4 cigarettes per day; Marital Status - Divorced; Alcohol Use: Never; Drug Use: No History; Caffeine Use: Moderate - Coffee, soda; Financial Concerns: No; Food, Clothing or Shelter Needs: No; Support System Lacking: No; Transportation Concerns: No Engineer, maintenance) Signed: 03/12/2019 5:21:33 PM By: Linton Ham  MD Signed: 03/15/2019 5:59:01 PM By: Levan Hurst RN, BSN Entered By: Levan Hurst on 03/12/2019 15:51:51 -------------------------------------------------------------------------------- [place] Details Patient Name: Date of Service: Billy Torres 03/12/2019 Medical Record [id number] Patient Account Number: [id number] Date of Birth/Sex: Treating RN: February 21, 1955 (64 y.o. Marvis Repress Primary Care Provider: Dorian Pod Other Clinician: Referring Provider: Treating Provider/Extender:Robson, Dulcy Fanny, JULIE Weeks in Treatment: 0 Diagnosis Coding ICD-10 Codes Code Description E11.621 Type 2 diabetes mellitus with foot ulcer E11.51 Type 2 diabetes mellitus with diabetic peripheral angiopathy without gangrene L97.522 Non-pressure chronic ulcer of other part of left foot with fat layer exposed Facility Procedures CPT4 Code Description: [id number] - WOUND CARE VISIT-LEV 3 EST PT Modifier: 25 Quantity: 1 CPT4 Code Description: [id number] - DEB SUBQ TISSUE 20 SQ CM/< ICD-10 Diagnosis Description L97.522 Non-pressure chronic ulcer of other part of left foot wit E11.621 Type 2 diabetes mellitus with foot ulcer Modifier: h fat layer exp Quantity: 1 osed Physician Procedures CPT4 Code Description: [id number] - WC PHYS LEVEL 4 - NEW PT ICD-10 Diagnosis Description E11.621 Type 2 diabetes mellitus with foot ulcer E11.51 Type 2 diabetes mellitus with diabetic peripheral angi L97.522 Non-pressure chronic ulcer of other part  of left foot Modifier: 25 opathy without with fat layer Quantity: 1 gangrene exposed Electronic Signature(s) Signed: 03/12/2019 5:21:33 PM By: Linton Ham MD Signed: 03/12/2019 5:33:53 PM By: Baruch Gouty RN, BSN Entered By: Baruch Gouty on 03/12/2019 17:20:55

## 2019-03-15 NOTE — Progress Notes (Signed)
Billy Torres, Billy Torres ([id number]) Visit Report for 03/12/2019 Abuse/Suicide Risk Screen Details Patient Name: Date of Service: Billy Torres, Billy Torres 03/12/2019 2:45 PM Medical Record [id number] Patient Account Number: [id number] Date of Birth/Sex: Treating RN: 1955-09-11 (64 y.o. Janyth Contes Primary Care Deidrick Rainey: Dorian Pod Other Clinician: Referring Dionisio Aragones: Treating Jef Futch/Extender:Robson, Dulcy Fanny, JULIE Weeks in Treatment: 0 Abuse/Suicide Risk Screen Items Answer ABUSE RISK SCREEN: Has anyone close to you tried to hurt or harm you recentlyo No Do you feel uncomfortable with anyone in your familyo No Has anyone forced you do things that you didnt want to doo No Electronic Signature(s) Signed: 03/15/2019 5:59:01 PM By: Levan Hurst RN, BSN Entered By: Levan Hurst on 03/12/2019 15:52:06 -------------------------------------------------------------------------------- Activities of Daily Living Details Patient Name: Date of Service: Billy Torres, Billy Torres 03/12/2019 2:45 PM Medical Record [id number] Patient Account Number: [id number] Date of Birth/Sex: Treating RN: 1955-07-27 (64 y.o. Janyth Contes Primary Care Zekiel Torian: Dorian Pod Other Clinician: Referring Elyssia Strausser: Treating Nishi Neiswonger/Extender:Robson, Dulcy Fanny, JULIE Weeks in Treatment: 0 Activities of Daily Living Items Answer Activities of Daily Living (Please select one for each item) Drive Automobile Not Able Take Medications Completely Able Use Telephone Completely Able Care for Appearance Completely Able Use Toilet Completely Able Bath / Shower Completely Able Dress Self Completely Able Feed Self Completely Able Walk Need Assistance Get In / Out Bed Completely [place] Need Assistance Shop for Self Need Assistance Electronic Signature(s) Signed: 03/15/2019 5:59:01 PM By: Levan Hurst RN, BSN Entered By:  Levan Hurst on 03/12/2019 15:52:44 -------------------------------------------------------------------------------- Education Screening Details Patient Name: Date of Service: Billy Torres 03/12/2019 2:45 PM Medical Record [id number] Patient Account Number: [id number] Date of Birth/Sex: Treating RN: 02-Jun-1955 (63 y.o. Janyth Contes Primary Care Kambra Beachem: Dorian Pod Other Clinician: Referring Jewelia Bocchino: Treating Carlesha Seiple/Extender:Robson, Dulcy Fanny, Achille Rich in Treatment: 0 Primary Learner Assessed: Patient Learning Preferences/Education Level/Primary Language Learning Preference: Explanation, Demonstration, Printed Material Highest Education Level: High School Preferred Language: English Cognitive Barrier Language Barrier: No Translator Needed: No Memory Deficit: No Emotional Barrier: No Cultural/Religious Beliefs Affecting Medical Care: No Physical Barrier Impaired Vision: No Impaired Hearing: No Decreased Hand dexterity: No Knowledge/Comprehension Knowledge Level: High Comprehension Level: High Ability to understand written High instructions: Ability to understand verbal High instructions: Motivation Anxiety Level: Calm Cooperation: Cooperative Education Importance: Acknowledges Need Interest in Health Problems: Asks Questions Perception: Coherent Willingness to Engage in Self- High Management Activities: Readiness to Engage in Self- High Management Activities: Electronic Signature(s) Signed: 03/15/2019 5:59:01 PM By: Levan Hurst RN, BSN Entered By: Levan Hurst on 03/12/2019 15:53:05 -------------------------------------------------------------------------------- Fall Risk Assessment Details Patient Name: Date of Service: Billy Torres 03/12/2019 2:45 PM Medical Record [id number] Patient Account Number: [id number] Date of Birth/Sex: Treating RN: 10/06/1955 (64 y.o. Janyth Contes Primary Care Darika Ildefonso:  Dorian Pod Other Clinician: Referring Antoneo Ghrist: Treating Aashka Salomone/Extender:Robson, Dulcy Fanny, JULIE Weeks in Treatment: 0 Fall Risk Assessment Items Have you had 2 or more falls in the last 12 monthso 0 No Have you had any fall that resulted in injury in the last 12 monthso 0 No FALLS RISK SCREEN History of falling - immediate or within 3 months 25 Yes Secondary diagnosis (Do you have 2 or more medical diagnoseso) 0 No Ambulatory aid None/bed rest/wheelchair/nurse 0 No Crutches/cane/walker 15 Yes Furniture 0 No Intravenous therapy Access/Saline/Heparin Lock 0 No Weak (short steps with or without shuffle, stooped but able to lift head 10 Yes while walking, may seek support from furniture) Impaired (short  steps with shuffle, may have difficulty arising from chair, 0 No head down, impaired balance) Mental Status Oriented to own ability 0 Yes Overestimates or forgets limitations 0 No Risk Level: Medium Risk Score: 50 Electronic Signature(s) Signed: 03/15/2019 5:59:01 PM By: Levan Hurst RN, BSN Entered By: Levan Hurst on 03/12/2019 15:53:43 -------------------------------------------------------------------------------- Foot Assessment Details Patient Name: Date of Service: Billy Torres 03/12/2019 2:45 PM Medical Record [id number] Patient Account Number: [id number] Date of Birth/Sex: Treating RN: 26-Feb-1955 (64 y.o. Janyth Contes Primary Care Hellon Vaccarella: Dorian Pod Other Clinician: Referring Floy Angert: Treating Neyland Pettengill/Extender:Robson, Dulcy Fanny, JULIE Weeks in Treatment: 0 Foot Assessment Items Site Locations + = Sensation present, - = Sensation absent, C = Callus, U = Ulcer R = Redness, W = Warmth, M = Maceration, PU = Pre-ulcerative lesion F = Fissure, S = Swelling, D = Dryness Assessment Right: Left: Other Deformity: No No Prior Foot Ulcer: No No Prior Amputation: No No Charcot Joint: No No Ambulatory Status: Ambulatory With  Help Assistance Device: Walker Gait: Steady Electronic Signature(s) Signed: 03/15/2019 5:59:01 PM By: Levan Hurst RN, BSN Entered By: Levan Hurst on 03/12/2019 15:54:52 -------------------------------------------------------------------------------- Nutrition Risk Screening Details Patient Name: Date of Service: Billy Torres 03/12/2019 2:45 PM Medical Record [id number] Patient Account Number: [id number] Date of Birth/Sex: Treating RN: 08-30-1955 (64 y.o. Janyth Contes Primary Care Fayne Mcguffee: Dorian Pod Other Clinician: Referring Janylah Belgrave: Treating Jontez Redfield/Extender:Robson, Dulcy Fanny, JULIE Weeks in Treatment: 0 Height (in): 68 Weight (lbs): 160 Body Mass Index (BMI): 24.3 Nutrition Risk Screening Items Score Screening NUTRITION RISK SCREEN: I have an illness or condition that made me change the kind and/or 0 No amount of food I eat I eat fewer than two meals per day 0 No I eat few fruits and vegetables, or milk products 0 No I have three or more drinks of beer, liquor or wine almost every day 0 No I have tooth or mouth problems that make it hard for me to eat 0 No I don't always have enough money to buy the food I need 0 No I eat alone most of the time 0 No I take three or more different prescribed or over-the-counter drugs a day 1 Yes 0 No Without wanting to, I have lost or gained 10 pounds in the last six months I am not always physically able to shop, cook and/or feed myself 2 Yes Nutrition Protocols Good Risk Protocol Provide education on elevated blood sugars and Moderate Risk Protocol 0 impact on wound healing, as applicable High Risk Proctocol Risk Level: Moderate Risk Score: 3 Electronic Signature(s) Signed: 03/15/2019 5:59:01 PM By: Levan Hurst RN, BSN Entered By: Levan Hurst on 03/12/2019 15:53:51

## 2019-03-19 ENCOUNTER — Other Ambulatory Visit: Payer: Self-pay

## 2019-03-19 ENCOUNTER — Encounter (HOSPITAL_BASED_OUTPATIENT_CLINIC_OR_DEPARTMENT_OTHER): Payer: Medicare (Managed Care) | Admitting: Internal Medicine

## 2019-03-19 DIAGNOSIS — E11621 Type 2 diabetes mellitus with foot ulcer: Secondary | ICD-10-CM | POA: Diagnosis not present

## 2019-03-22 NOTE — Progress Notes (Addendum)
Billy Torres, Billy Torres ([id number]) Visit Report for 03/19/2019 Arrival Information Details Patient Name: Date of Service: Billy Torres, Billy Torres 03/19/2019 2:45 PM Medical Record [id number] Patient Account Number: [id number] Date of Birth/Sex: Treating RN: Aug 23, 1955 (64 y.o. Ernestene Mention Primary Care Leelyn Jasinski: Dorian Pod Other Clinician: Referring Elder Davidian: Treating Tasmin Exantus/Extender:Robson, Dulcy Fanny, JULIE Weeks in Treatment: 1 Visit Information History Since Last Visit Added or deleted any medications: No Patient Arrived: Gilford Rile Any new allergies or adverse reactions: No Arrival Time: 15:10 Had a fall or experienced change in No Accompanied By: self activities of daily living that may affect Transfer Assistance: None risk of falls: Patient Identification Verified: Yes Signs or symptoms of abuse/neglect since last No Secondary Verification Process Yes visito Completed: Hospitalized since last visit: No Patient Requires Transmission- No Implantable device outside of the clinic excluding No Based Precautions: cellular tissue based products placed in the center Patient Has Alerts: Yes since last visit: Patient Alerts: Patient on Blood Has Dressing in Place as Prescribed: Yes Thinner Pain Present Now: Yes L ABI = 1.22 TBI = 0.55 (01/2019) Electronic Signature(s) Signed: 03/19/2019 5:31:10 PM By: Baruch Gouty RN, BSN Entered By: Baruch Gouty on 03/19/2019 15:10:35 -------------------------------------------------------------------------------- Lower Extremity Assessment Details Patient Name: Date of Service: Billy Torres 03/19/2019 2:45 PM Medical Record [id number] Patient Account Number: [id number] Date of Birth/Sex: Treating RN: 1955/12/03 (63 y.o. Ernestene Mention Primary Care Skyy Nilan: Dorian Pod Other Clinician: Referring Chiamaka Latka: Treating Hansini Clodfelter/Extender:Robson, Dulcy Fanny, JULIE Weeks in Treatment: 1 Edema  Assessment Assessed: [Left: No] [Right: No] Edema: [Left: N] [Right: o] Calf Left: Right: Point of Measurement: 31 cm From Medial Instep 32 cm cm Ankle Left: Right: Point of Measurement: 11 cm From Medial Instep 22.2 cm cm Vascular Assessment Pulses: Dorsalis Pedis Palpable: [Left:No] Electronic Signature(s) Signed: 03/19/2019 5:31:10 PM By: Baruch Gouty RN, BSN Entered By: Baruch Gouty on 03/19/2019 15:16:33 -------------------------------------------------------------------------------- Multi Wound Chart Details Patient Name: Date of Service: Billy Torres 03/19/2019 2:45 PM Medical Record [id number] Patient Account Number: [id number] Date of Birth/Sex: Treating RN: 1955/08/27 (63 y.o. Marvis Repress Primary Care Shreshta Medley: Dorian Pod Other Clinician: Referring Joud Ingwersen: Treating Aziel Morgan/Extender:Robson, Dulcy Fanny, JULIE Weeks in Treatment: 1 Vital Signs Height(in): 68 Capillary Blood 111 Glucose(mg/dl): Weight(lbs): 160 Pulse(bpm): 69 Body Mass Index(BMI): 24 Blood Pressure(mmHg): 135/67 Temperature(F): 98.9 Respiratory 18 Rate(breaths/min): Photos: [1:No Photos] [2:No Photos] [N/A:N/A] Wound Location: [1:Left Foot - Dorsal] [2:Left Toe Fourth - Dorsal] [N/A:N/A] Wounding Event: [1:Gradually Appeared] [2:Gradually Appeared] [N/A:N/A] Primary Etiology: [1:Diabetic Wound/Ulcer of the Diabetic Wound/Ulcer of the N/A Lower Extremity] [2:Lower Extremity] Comorbid History: [1:Hypertension, Peripheral Hypertension, Peripheral N/A Arterial Disease, Peripheral Arterial Disease, Peripheral Venous Disease, Type II Venous Disease, Type II Diabetes, Osteoarthritis] [2:Diabetes, Osteoarthritis] Date Acquired: [1:11/08/2018] [2:11/08/2018] [N/A:N/A] Weeks of Treatment: [1:1] [2:1] [N/A:N/A] Wound Status: [1:Open] [2:Open] [N/A:N/A] Measurements L x W x D 1.5x1.4x0.7 [2:0.2x0.4x0.1] [N/A:N/A] (cm) Area (cm) : [1:1.649] [2:0.063]  [N/A:N/A] Volume (cm) : [1:1.155] [2:0.006] [N/A:N/A] % Reduction in Area: [1:-40.00%] [2:0.00%] [N/A:N/A] % Reduction in Volume: [1:10.90%] [2:53.80%] [N/A:N/A] Classification: [1:Grade 2] [2:Unable to visualize wound bed] [N/A:N/A] Exudate Amount: [1:Medium] [2:Small] [N/A:N/A] Exudate Type: [1:Serosanguineous] [2:Serous] [N/A:N/A] Exudate Color: [1:red, brown] [2:amber] [N/A:N/A] Wound Margin: [1:Distinct, outline attached] [2:Flat and Intact] [N/A:N/A] Granulation Amount: [1:Medium (34-66%)] [2:None Present (0%)] [N/A:N/A] Granulation Quality: [1:Red] [2:N/A] [N/A:N/A] Necrotic Amount: [1:Medium (34-66%)] [2:Large (67-100%)] [N/A:N/A] Exposed Structures: [1:Fat Layer (Subcutaneous Tissue) Exposed: Yes Muscle: Yes Fascia: No Tendon: No Joint: No Bone: No] [2:Fat Layer (Subcutaneous Tissue) Exposed: Yes Fascia: No Tendon: No Muscle: No Joint: No  Bone: No] [N/A:N/A] Epithelialization: [1:None] [2:None] [N/A:N/A] Debridement: [1:Debridement - Excisional] [2:N/A] [N/A:N/A] Pre-procedure [1:15:50] [2:N/A] [N/A:N/A] Verification/Time Out Taken: Pain Control: [1:Other] [2:N/A] [N/A:N/A] Tissue Debrided: [1:Subcutaneous, Slough] [2:N/A] [N/A:N/A] Level: [1:Skin/Subcutaneous Tissue] [2:N/A] [N/A:N/A] Debridement Area (sq cm):2.1 [2:N/A] [N/A:N/A] Instrument: [1:Curette] [2:N/A] [N/A:N/A] Bleeding: [1:Minimum] [2:N/A] [N/A:N/A] Hemostasis Achieved: [1:Pressure] [2:N/A] [N/A:N/A] Procedural Pain: [1:0] [2:N/A] [N/A:N/A] Post Procedural Pain: [1:0] [2:N/A] [N/A:N/A] Debridement Treatment Procedure was tolerated [2:N/A] [N/A:N/A] Response: [1:well] Post Debridement [1:1.5x1.4x0.7] [2:N/A] [N/A:N/A] Measurements L x W x D (cm) Post Debridement [1:1.155] [2:N/A] [N/A:N/A] Volume: (cm) Procedures Performed: Debridement [2:N/A] [N/A:N/A] Treatment Notes Electronic Signature(s) Signed: 03/19/2019 5:31:36 PM By: Kela Millin Signed: 03/22/2019 8:43:01 AM By: Linton Ham MD Entered  By: Linton Ham on 03/19/2019 16:01:29 -------------------------------------------------------------------------------- Multi-Disciplinary Care Plan Details Patient Name: Date of Service: Billy Torres 03/19/2019 2:45 PM Medical Record [id number] Patient Account Number: [id number] Date of Birth/Sex: Treating RN: 02-Oct-1955 (64 y.o. Marvis Repress Primary Care Ala Kratz: Dorian Pod Other Clinician: Referring Jahni Paul: Treating Zanya Lindo/Extender:Robson, Dulcy Fanny, JULIE Weeks in Treatment: 1 Active Inactive Abuse / Safety / Falls / Self Care Management Nursing Diagnoses: Potential for falls Goals: Patient will not experience any injury related to falls Date Initiated: 03/12/2019 Target Resolution Date: 04/09/2019 Goal Status: Active Patient/caregiver will verbalize/demonstrate measures taken to prevent injury and/or falls Date Initiated: 03/12/2019 Target Resolution Date: 04/09/2019 Goal Status: Active Interventions: Assess fall risk on admission and as needed Assess impairment of mobility on admission and as needed per policy Notes: Nutrition Nursing Diagnoses: Impaired glucose control: actual or potential Potential for alteratiion in Nutrition/Potential for imbalanced nutrition Goals: Patient/caregiver will maintain therapeutic glucose control Date Initiated: 03/12/2019 Target Resolution Date: 04/09/2019 Goal Status: Active Interventions: Assess HgA1c results as ordered upon admission and as needed Assess patient nutrition upon admission and as needed per policy Provide education on elevated blood sugars and impact on wound healing Treatment Activities: Patient referred to Primary Care Physician for further nutritional evaluation : 03/12/2019 Notes: Wound/Skin Impairment Nursing Diagnoses: Impaired tissue integrity Knowledge deficit related to smoking impact on wound healing Knowledge deficit related to ulceration/compromised skin  integrity Goals: Patient will demonstrate a reduced rate of smoking or cessation of smoking Date Initiated: 03/12/2019 Target Resolution Date: 04/09/2019 Goal Status: Active Patient/caregiver will verbalize understanding of skin care regimen Date Initiated: 03/12/2019 Target Resolution Date: 04/09/2019 Goal Status: Active Ulcer/skin breakdown will have a volume reduction of 30% by week 4 Date Initiated: 03/12/2019 Target Resolution Date: 04/09/2019 Goal Status: Active Interventions: Assess patient/caregiver ability to obtain necessary supplies Assess patient/caregiver ability to perform ulcer/skin care regimen upon admission and as needed Assess ulceration(s) every visit Provide education on smoking Provide education on ulcer and skin care Treatment Activities: Skin care regimen initiated : 03/12/2019 Topical wound management initiated : 03/12/2019 Notes: Electronic Signature(s) Signed: 03/19/2019 5:31:36 PM By: Kela Millin Entered By: Kela Millin on 03/19/2019 15:42:32 -------------------------------------------------------------------------------- Pain Assessment Details Patient Name: Date of Service: Billy Torres, Billy Torres 03/19/2019 2:45 PM Medical Record [id number] Patient Account Number: [id number] Date of Birth/Sex: Treating RN: [birth year] (64 y.o. Ernestene Mention Primary Care Lenorris Karger: Dorian Pod Other Clinician: Referring Yostin Malacara: Treating Jacklin Zwick/Extender:Robson, Dulcy Fanny, JULIE Weeks in Treatment: 1 Active Problems Location of Pain Severity and Description of Pain Patient Has Paino Yes Site Locations Pain Location: Pain in Ulcers With Dressing Change: Yes Duration of the Pain. Constant / Intermittento Constant Rate the pain. Current Pain Level: 7 Character of Pain Describe the Pain: Aching, Throbbing Pain Management and Medication Current Pain Management: Medication: Yes Is the  Current Pain Management Adequate: Adequate How does your  wound impact your activities of daily livingo Sleep: Yes Bathing: No Appetite: No Relationship With Others: No Bladder Continence: No Emotions: Yes Bowel Continence: No Work: No Toileting: No Drive: No Dressing: No Hobbies: Yes Electronic Signature(s) Signed: 03/19/2019 5:31:10 PM By: Baruch Gouty RN, BSN Entered By: Baruch Gouty on 03/19/2019 15:16:14 -------------------------------------------------------------------------------- Patient/Caregiver Education Details Patient Name: Date of Service: Billy Torres 3/12/2021andnbsp2:45 PM Medical Record [id number] Patient Account Number: [id number] Date of Birth/Gender: Treating RN: November 13, 1955 (63 y.o. Marvis Repress Primary Care Physician: Dorian Pod Other Clinician: Referring Physician: Treating Physician/Extender:Robson, Dulcy Fanny, Achille Rich in Treatment: 1 Education Assessment Education Provided To: Patient Education Topics Provided Elevated Blood Sugar/ Impact on Healing: Methods: Explain/Verbal Responses: State content correctly Smoking and Wound Healing: Methods: Explain/Verbal Responses: State content correctly Wound/Skin Impairment: Methods: Explain/Verbal Responses: State content correctly Electronic Signature(s) Signed: 03/19/2019 5:31:36 PM By: Kela Millin Entered By: Kela Millin on 03/19/2019 15:42:53 -------------------------------------------------------------------------------- Wound Assessment Details Patient Name: Date of Service: Billy Torres, Billy Torres 03/19/2019 2:45 PM Medical Record [id number] Patient Account Number: [id number] Date of Birth/Sex: Treating RN: 1955-04-11 (64 y.o. Marvis Repress Primary Care Kylil Swopes: Dorian Pod Other Clinician: Referring Isael Stille: Treating Avontae Burkhead/Extender:Robson, Dulcy Fanny, JULIE Weeks in Treatment: 1 Wound Status Wound Number: 1 Primary Diabetic Wound/Ulcer of the Lower  Extremity Etiology: Wound Location: Left Foot - Dorsal Wound Open Wounding Event: Gradually Appeared Status: Date Acquired: 11/08/2018 Comorbid Hypertension, Peripheral Arterial Disease, Weeks Of Treatment: 1 History: Peripheral Venous Disease, Type II Diabetes, Clustered Wound: No Osteoarthritis Photos Wound Measurements Length: (cm) 1.5 % Reducti Width: (cm) 1.4 % Reducti Depth: (cm) 0.7 Epithelia Area: (cm) 1.649 Tunnelin Volume: (cm) 1.155 Undermin Wound Description Classification: Grade 2 Wound Margin: Distinct, outline attached Exudate Amount: Medium Exudate Type: Serosanguineous Exudate Color: red, brown Wound Bed Granulation Amount: Medium (34-66%) Granulation Quality: Red Necrotic Amount: Medium (34-66%) Necrotic Quality: Adherent Slough Foul Odor After Cleansing: No Slough/Fibrino Yes Exposed Structure Fascia Exposed: No Fat Layer (Subcutaneous Tissue) Exposed: Yes Tendon Exposed: No Muscle Exposed: Yes Necrosis of Muscle: No Joint Exposed: No Bone Exposed: No on in Area: -40% on in Volume: 10.9% lization: None g: No ing: No Treatment Notes Wound #1 (Left, Dorsal Foot) 1. Cleanse With Wound Cleanser 3. Primary Dressing Applied Santyl Other primary dressing (specifiy in notes) 4. Secondary Dressing Dry Gauze Roll Gauze 5. Secured With Medipore tape Notes primary dressing saline moisten gauze backing saline. Electronic Signature(s) Signed: 03/22/2019 5:11:29 PM By: Mikeal Hawthorne EMT/HBOT Signed: 03/22/2019 5:25:45 PM By: Kela Millin Previous Signature: 03/19/2019 5:31:10 PM Version By: Baruch Gouty RN, BSN Entered By: Mikeal Hawthorne on 03/22/2019 13:53:02 -------------------------------------------------------------------------------- Wound Assessment Details Patient Name: Date of Service: Billy Torres 03/19/2019 2:45 PM Medical Record [id number] Patient Account Number: [id number] Date of Birth/Sex: Treating  RN: 07-08-1955 (64 y.o. Marvis Repress Primary Care Anwen Cannedy: Dorian Pod Other Clinician: Referring Cosmo Tetreault: Treating Jevonte Clanton/Extender:Robson, Dulcy Fanny, JULIE Weeks in Treatment: 1 Wound Status Wound Number: 2 Primary Diabetic Wound/Ulcer of the Lower Extremity Etiology: Wound Location: Left Toe Fourth - Dorsal Wound Open Wounding Event: Gradually Appeared Status: Date Acquired: 11/08/2018 Comorbid Hypertension, Peripheral Arterial Disease, Weeks Of Treatment: 1 History: Peripheral Venous Disease, Type II Diabetes, Clustered Wound: No Osteoarthritis Photos Wound Measurements Length: (cm) 0.2 Width: (cm) 0.4 Depth: (cm) 0.1 Area: (cm) 0.063 Volume: (cm) 0.006 Wound Description Classification: Unable to visualize wound bed Wound Margin: Flat and Intact Exudate Amount: Small Exudate Type: Serous Exudate Color: amber Wound Bed  Granulation Amount: None Present (0%) Necrotic Amount: Large (67-100%) Necrotic Quality: Adherent Slough After Cleansing: No brino Yes Exposed Structure posed: No (Subcutaneous Tissue) Exposed: Yes posed: No posed: No osed: No sed: No % Reduction in Area: 0% % Reduction in Volume: 53.8% Epithelialization: None Tunneling: No Undermining: No Foul Odor Slough/Fi Fascia Ex Fat Layer Tendon Ex Muscle Ex Joint Exp Bone Expo Treatment Notes Wound #2 (Left, Dorsal Toe Fourth) 1. Cleanse With Wound Cleanser 3. Primary Dressing Applied Santyl Other primary dressing (specifiy in notes) 4. Secondary Dressing Dry Gauze Roll Gauze 5. Secured With Medipore tape Notes primary dressing saline moisten gauze backing saline. Electronic Signature(s) Signed: 03/22/2019 5:11:29 PM By: Mikeal Hawthorne EMT/HBOT Signed: 03/22/2019 5:25:45 PM By: Kela Millin Previous Signature: 03/19/2019 5:31:10 PM Version By: Baruch Gouty RN, BSN Entered By: Mikeal Hawthorne on 03/22/2019  13:53:46 -------------------------------------------------------------------------------- Vitals Details Patient Name: Date of Service: Billy Torres 03/19/2019 2:45 PM Medical Record [id number] Patient Account Number: [id number] Date of Birth/Sex: Treating RN: 1955-11-12 (64 y.o. Ernestene Mention Primary Care Yvone Slape: Dorian Pod Other Clinician: Referring Ezriel Boffa: Treating Desa Rech/Extender:Robson, Dulcy Fanny, JULIE Weeks in Treatment: 1 Vital Signs Time Taken: 15:11 Temperature (F): 98.9 Height (in): 68 Pulse (bpm): 87 Source: Stated Respiratory Rate (breaths/min): 18 Weight (lbs): 160 Blood Pressure (mmHg): 135/67 Source: Stated Capillary Blood Glucose (mg/dl): 111 Body Mass Index (BMI): 24.3 Reference Range: 80 - 120 mg / dl Notes glucose per pt report today Electronic Signature(s) Signed: 03/19/2019 5:31:10 PM By: Baruch Gouty RN, BSN Entered By: Baruch Gouty on 03/19/2019 15:11:58

## 2019-03-22 NOTE — Progress Notes (Signed)
RADHAMES, ESMAILI ([id number]) Visit Report for 03/19/2019 Debridement Details Patient Name: Date of Service: Billy Torres, Billy Torres 03/19/2019 2:45 PM Medical Record [id number] Patient Account Number: [id number] Date of Birth/Sex: Treating RN: Jun 01, 1955 (64 y.o. Marvis Repress Primary Care Provider: Dorian Pod Other Clinician: Referring Provider: Treating Provider/Extender:Jadier Rockers, Dulcy Fanny, JULIE Weeks in Treatment: 1 Debridement Performed for Wound #1 Left,Dorsal Foot Assessment: Performed By: Physician Ricard Dillon., MD Debridement Type: Debridement Severity of Tissue Pre Fat layer exposed Debridement: Level of Consciousness (Pre- Awake and Alert procedure): Pre-procedure Verification/Time Out Taken: Yes - 15:50 Start Time: 15:50 Pain Control: Other : benzocaine, 20% Total Area Debrided (L x W): 1.5 (cm) x 1.4 (cm) = 2.1 (cm) Tissue and other material Viable, Non-Viable, Slough, Subcutaneous, Slough debrided: Level: Skin/Subcutaneous Tissue Debridement Description: Excisional Instrument: Curette Bleeding: Minimum Hemostasis Achieved: Pressure End Time: 15:51 Procedural Pain: 0 Post Procedural Pain: 0 Response to Treatment: Procedure was tolerated well Level of Consciousness Awake and Alert (Post-procedure): Post Debridement Measurements of Total Wound Length: (cm) 1.5 Width: (cm) 1.4 Depth: (cm) 0.7 Volume: (cm) 1.155 Character of Wound/Ulcer Post Improved Debridement: Severity of Tissue Post Debridement: Fat layer exposed Post Procedure Diagnosis Same as Pre-procedure Electronic Signature(s) Signed: 03/19/2019 5:31:36 PM By: Kela Millin Signed: 03/22/2019 8:43:01 AM By: Linton Ham MD Entered By: Linton Ham on 03/19/2019 16:01:36 -------------------------------------------------------------------------------- HPI Details Patient Name: Date of Service: Jamie Brookes 03/19/2019 2:45 PM Medical Record  [id number] Patient Account Number: [id number] Date of Birth/Sex: Treating RN: Sep 01, 1955 (64 y.o. Marvis Repress Primary Care Provider: Dorian Pod Other Clinician: Referring Provider: Treating Provider/Extender:Laurence Crofford, Dulcy Fanny, Achille Rich in Treatment: 1 History of Present Illness HPI Description: ADMISSION 03/12/2019 This is a 64 year old man with type 2 diabetes on oral agents. He has had a wound on the left dorsal foot just proximal to the base of the third and fourth toes since at least November he underwent a angiogram on 12/11/2018 by vein and vascular. He underwent stenting of the left SFA. Noted to have single-vessel runoff via the peroneal artery which is included in the distal leg. I do not think they was felt that he had an additional procedure option. He was recently seen by vascular on 02/12/2019 noted to have a 1.5 x 1 cm ulcer and suggested wet-to-dry twice daily. He actually has another ulcer on the dorsal aspect of the left fourth toe at the base. An MRI of the foot done on 12/07/2018 did not show evidence of osteomyelitis The patient had repeat ABIs on 01/27/2019. On the right he was noncompressible with a TBI of 0.4 monophasic waveforms. On the left ABI of 1.22 with a TBI of 0.55. This is a marked improvement from previous TBI at which time it was 0.43 and before that 0.16 I spoke with Dr. Jimmye Norman who is the patient's primary physician. They change the dressing to Santyl earlier this week. That is appropriate. The patient ranks his pain at a 4 out of 10. We are asked about hyperbaric oxygen therapy. Past medical history; type 2 diabetes, prostate CA, history of stroke, chronic venous disease, he is a smoker. 03/19/19; no major change using santyl to both wound areas on the dorsal foot Electronic Signature(s) Signed: 03/22/2019 8:43:01 AM By: Linton Ham MD Entered By: Linton Ham on 03/19/2019  16:03:05 -------------------------------------------------------------------------------- Physical Exam Details Patient Name: Date of Service: Jamie Brookes 03/19/2019 2:45 PM Medical Record [id number] Patient Account Number: [id number] Date of Birth/Sex: Treating RN: 20-May-1955 (64 y.o. Marvis Repress Primary  Care Provider: Dorian Pod Other Clinician: Referring Provider: Treating Provider/Extender:Velton Roselle, Dulcy Fanny, JULIE Weeks in Treatment: 1 Constitutional Sitting or standing Blood Pressure is within target range for patient.. Pulse regular and within target range for patient.Marland Kitchen Respirations regular, non-labored and within target range.. Temperature is normal and within the target range for the patient.Marland Kitchen Appears in no distress. Cardiovascular I cannot feel the peripheral pulses on the left. I think he had a popliteal pulse palpable. His foot is not particularly cold I wonder if he is getting some perfusion here. Notes 03/19/2019; wound exam; the patient has a punched-out area over the dorsal base of the left third toe. This is precariously close to his underlying tendons. There is continued adherent debris which I gently tied removed with a #3 curette. There was minimal bleeding. Electronic Signature(s) Signed: 03/22/2019 8:43:01 AM By: Linton Ham MD Entered By: Linton Ham on 03/19/2019 16:05:27 -------------------------------------------------------------------------------- Physician Orders Details Patient Name: Date of Service: Jamie Brookes 03/19/2019 2:45 PM Medical Record [id number] Patient Account Number: [id number] Date of Birth/Sex: Treating RN: 1955/03/02 (64 y.o. Marvis Repress Primary Care Provider: Dorian Pod Other Clinician: Referring Provider: Treating Provider/Extender:Jarone Ostergaard, Dulcy Fanny, Achille Rich in Treatment: 1 Verbal / Phone Orders: No Diagnosis Coding ICD-10 Coding Code  Description E11.621 Type 2 diabetes mellitus with foot ulcer E11.51 Type 2 diabetes mellitus with diabetic peripheral angiopathy without gangrene L97.522 Non-pressure chronic ulcer of other part of left foot with fat layer exposed Follow-up Appointments Return Appointment in 1 week. Dressing Change Frequency Wound #1 Left,Dorsal Foot Change dressing every day. Wound #2 Left,Dorsal Toe Fourth Change dressing every day. Wound Cleansing Wound #1 Left,Dorsal Foot Clean wound with Wound Cleanser Wound #2 Left,Dorsal Toe Fourth Clean wound with Wound Cleanser Primary Wound Dressing Wound #1 Left,Dorsal Foot Santyl Ointment Wound #2 Left,Dorsal Toe Fourth Santyl Ointment Secondary Dressing Kerlix/Rolled Gauze Dry Gauze Other: - saline moistened gauze over santyl [facility] for wound care. - [facility] Electronic Signature(s) Signed: 03/19/2019 5:31:36 PM By: Kela Millin Signed: 03/22/2019 8:43:01 AM By: Linton Ham MD Entered By: Kela Millin on 03/19/2019 15:51:35 -------------------------------------------------------------------------------- Problem List Details Patient Name: Date of Service: Jamie Brookes 03/19/2019 2:45 PM Medical Record [id number] Patient Account Number: [id number] Date of Birth/Sex: Treating RN: Jun 20, 1955 (64 y.o. Marvis Repress Primary Care Provider: Dorian Pod Other Clinician: Referring Provider: Treating Provider/Extender:Jamita Mckelvin, Dulcy Fanny, Achille Rich in Treatment: 1 Active Problems ICD-10 Evaluated Encounter Code Description Active Date Today Diagnosis E11.621 Type 2 diabetes mellitus with foot ulcer 03/12/2019 No Yes E11.51 Type 2 diabetes mellitus with diabetic peripheral 03/12/2019 No Yes angiopathy without gangrene L97.522 Non-pressure chronic ulcer of other part of left foot 03/12/2019 No Yes with fat layer exposed Inactive Problems Resolved Problems Electronic  Signature(s) Signed: 03/22/2019 8:43:01 AM By: Linton Ham MD Entered By: Linton Ham on 03/19/2019 16:01:12 -------------------------------------------------------------------------------- Progress Note Details Patient Name: Date of Service: Jamie Brookes 03/19/2019 2:45 PM Medical Record [id number] Patient Account Number: [id number] Date of Birth/Sex: Treating RN: [birth year] (64 y.o. Marvis Repress Primary Care Provider: Dorian Pod Other Clinician: Referring Provider: Treating Provider/Extender:Deshunda Thackston, Dulcy Fanny, JULIE Weeks in Treatment: 1 Subjective History of Present Illness (HPI) ADMISSION 03/12/2019 This is a 64 year old man with type 2 diabetes on oral agents. He has had a wound on the left dorsal foot just proximal to the base of the third and fourth toes since at least November he underwent a angiogram on 12/11/2018 by vein and vascular. He underwent stenting of the  left SFA. Noted to have single-vessel runoff via the peroneal artery which is included in the distal leg. I do not think they was felt that he had an additional procedure option. He was recently seen by vascular on 02/12/2019 noted to have a 1.5 x 1 cm ulcer and suggested wet-to-dry twice daily. He actually has another ulcer on the dorsal aspect of the left fourth toe at the base. An MRI of the foot done on 12/07/2018 did not show evidence of osteomyelitis The patient had repeat ABIs on 01/27/2019. On the right he was noncompressible with a TBI of 0.4 monophasic waveforms. On the left ABI of 1.22 with a TBI of 0.55. This is a marked improvement from previous TBI at which time it was 0.43 and before that 0.16 I spoke with Dr. Jimmye Norman who is the patient's primary physician. They change the dressing to Santyl earlier this week. That is appropriate. The patient ranks his pain at a 4 out of 10. We are asked about hyperbaric oxygen therapy. Past medical history; type 2 diabetes, prostate  CA, history of stroke, chronic venous disease, he is a smoker. 03/19/19; no major change using santyl to both wound areas on the dorsal foot Objective Constitutional Sitting or standing Blood Pressure is within target range for patient.. Pulse regular and within target range for patient.Marland Kitchen Respirations regular, non-labored and within target range.. Temperature is normal and within the target range for the patient.Marland Kitchen Appears in no distress. Vitals Time Taken: 3:11 PM, Height: 68 in, Source: Stated, Weight: 160 lbs, Source: Stated, BMI: 24.3, Temperature: 98.9 F, Pulse: 87 bpm, Respiratory Rate: 18 breaths/min, Blood Pressure: 135/67 mmHg, Capillary Blood Glucose: 111 mg/dl. General Notes: glucose per pt report today Cardiovascular I cannot feel the peripheral pulses on the left. I think he had a popliteal pulse palpable. His foot is not particularly cold I wonder if he is getting some perfusion here. General Notes: 03/19/2019; wound exam; the patient has a punched-out area over the dorsal base of the left third toe. This is precariously close to his underlying tendons. There is continued adherent debris which I gently tied removed with a #3 curette. There was minimal bleeding. Integumentary (Hair, Skin) Wound #1 status is Open. Original cause of wound was Gradually Appeared. The wound is located on the Left,Dorsal Foot. The wound measures 1.5cm length x 1.4cm width x 0.7cm depth; 1.649cm^2 area and 1.155cm^3 volume. There is muscle and Fat Layer (Subcutaneous Tissue) Exposed exposed. There is no tunneling or undermining noted. There is a medium amount of serosanguineous drainage noted. The wound margin is distinct with the outline attached to the wound base. There is medium (34-66%) red granulation within the wound bed. There is a medium (34-66%) amount of necrotic tissue within the wound bed including Adherent Slough. Wound #2 status is Open. Original cause of wound was Gradually Appeared. The  wound is located on the Left,Dorsal Toe Fourth. The wound measures 0.2cm length x 0.4cm width x 0.1cm depth; 0.063cm^2 area and 0.006cm^3 volume. There is Fat Layer (Subcutaneous Tissue) Exposed exposed. There is no tunneling or undermining noted. There is a small amount of serous drainage noted. The wound margin is flat and intact. There is no granulation within the wound bed. There is a large (67-100%) amount of necrotic tissue within the wound bed including Adherent Slough. Assessment Active Problems ICD-10 Type 2 diabetes mellitus with foot ulcer Type 2 diabetes mellitus with diabetic peripheral angiopathy without gangrene Non-pressure chronic ulcer of other part of left foot  with fat layer exposed Procedures Wound #1 Pre-procedure diagnosis of Wound #1 is a Diabetic Wound/Ulcer of the Lower Extremity located on the Left,Dorsal Foot .Severity of Tissue Pre Debridement is: Fat layer exposed. There was a Excisional Skin/Subcutaneous Tissue Debridement with a total area of 2.1 sq cm performed by Ricard Dillon., MD. With the following instrument(s): Curette to remove Viable and Non-Viable tissue/material. Material removed includes Subcutaneous Tissue and Slough and after achieving pain control using Other (benzocaine, 20%). No specimens were taken. A time out was conducted at 15:50, prior to the start of the procedure. A Minimum amount of bleeding was controlled with Pressure. The procedure was tolerated well with a pain level of 0 throughout and a pain level of 0 following the procedure. Post Debridement Measurements: 1.5cm length x 1.4cm width x 0.7cm depth; 1.155cm^3 volume. Character of Wound/Ulcer Post Debridement is improved. Severity of Tissue Post Debridement is: Fat layer exposed. Post procedure Diagnosis Wound #1: Same as Pre-Procedure Plan Follow-up Appointments: Return Appointment in 1 week. Dressing Change Frequency: Wound #1 Left,Dorsal Foot: Change dressing every  day. Wound #2 Left,Dorsal Toe Fourth: Change dressing every day. Wound Cleansing: Wound #1 Left,Dorsal Foot: Clean wound with Wound Cleanser Wound #2 Left,Dorsal Toe Fourth: Clean wound with Wound Cleanser Primary Wound Dressing: Wound #1 Left,Dorsal Foot: Santyl Ointment Wound #2 Left,Dorsal Toe Fourth: Santyl Ointment Secondary Dressing: Kerlix/Rolled Gauze Dry Gauze Other: - saline moistened gauze over santyl Home Health: [facility] for wound care. [facility] 1. Continue with Santyl to both wound areas. To the deeper area at the base of the third toe backing wet-to-dry 2. I think I should be able to try endoform on this next week. I would also wonder about Dermagraft or possibly an amniotic membrane product. 3. By review of the vascular notes I do not believe they have any other options for revascularization. He had one- vessel runoff via the peroneal. Optimistically his foot is not particularly cold so there is probably some perfusion here whether it is enough to heal these wounds is uncertain. Also concerning is the depth of the wound which is just against the extensor tendons of his toes Electronic Signature(s) Signed: 03/22/2019 8:43:01 AM By: Linton Ham MD Entered By: Linton Ham on 03/19/2019 16:07:05 -------------------------------------------------------------------------------- SuperBill Details Patient Name: Date of Service: Jamie Brookes 03/19/2019 Medical Record [id number] Patient Account Number: [id number] Date of Birth/Sex: Treating RN: 12-14-1955 (64 y.o. Marvis Repress Primary Care Provider: Dorian Pod Other Clinician: Referring Provider: Treating Provider/Extender:Jazleen Robeck, Dulcy Fanny, JULIE Weeks in Treatment: 1 Diagnosis Coding ICD-10 Codes Code Description E11.621 Type 2 diabetes mellitus with foot ulcer E11.51 Type 2 diabetes mellitus with diabetic peripheral angiopathy without  gangrene L97.522 Non-pressure chronic ulcer of other part of left foot with fat layer exposed Facility Procedures CPT4 Code Description: [id number] - DEB SUBQ TISSUE 20 SQ CM/< ICD-10 Diagnosis Description E11.621 Type 2 diabetes mellitus with foot ulcer E11.51 Type 2 diabetes mellitus with diabetic peripheral ang L97.522 Non-pressure chronic ulcer of other part  of left foot Modifier: iopathy without with fat layer Quantity: 1 gangrene exposed Physician Procedures CPT4 Code Description: [id number] - WC PHYS SUBQ TISS 20 SQ CM ICD-10 Diagnosis Description E11.621 Type 2 diabetes mellitus with foot ulcer E11.51 Type 2 diabetes mellitus with diabetic peripheral ang L97.522 Non-pressure chronic ulcer of other part  of left foot Modifier: iopathy without with fat layer Quantity: 1 gangrene exposed Electronic Signature(s) Signed: 03/22/2019 8:43:01 AM By: Linton Ham MD Entered  By: Linton Ham on 03/19/2019 16:07:24

## 2019-03-25 ENCOUNTER — Encounter: Payer: Self-pay | Admitting: Vascular Surgery

## 2019-03-25 ENCOUNTER — Other Ambulatory Visit: Payer: Self-pay

## 2019-03-25 ENCOUNTER — Ambulatory Visit (INDEPENDENT_AMBULATORY_CARE_PROVIDER_SITE_OTHER): Payer: Medicare (Managed Care) | Admitting: Vascular Surgery

## 2019-03-25 VITALS — BP 93/57 | HR 85 | Temp 98.3°F | Resp 18 | Ht 68.0 in | Wt 154.5 lb

## 2019-03-25 DIAGNOSIS — I70299 Other atherosclerosis of native arteries of extremities, unspecified extremity: Secondary | ICD-10-CM | POA: Diagnosis not present

## 2019-03-25 DIAGNOSIS — L97909 Non-pressure chronic ulcer of unspecified part of unspecified lower leg with unspecified severity: Secondary | ICD-10-CM

## 2019-03-25 NOTE — Progress Notes (Signed)
Patient name: Billy Torres MRN: [id number] DOB: 1955-03-26 Sex: male  REASON FOR VISIT:   Nonhealing wound on foot.  HPI:   Billy Torres is a pleasant 64 y.o. male who I last saw on 01/27/2019.  He had been admitted to the hospital with a nonhealing wound on the left foot and evidence of infrainguinal arterial occlusive disease.  On 12/11/2018 he underwent angioplasty and stenting of the left superficial femoral artery with a drug-coated balloon.  He had an excellent result with no residual stenosis.  There was single-vessel runoff on the left via the peroneal artery only.  When I saw him last he felt that the wound on the dorsum of his left foot was improving.  Duplex at that time showed some stenosis proximal to the area that was angioplastied.  On the left side, he had an ABI of 100% with a toe pressure of 69 mmHg.  I was worried about the wound on the plantar aspect of his left foot especially given that he has peroneal runoff only.  He was subsequently seen by Risa Grill, PA on 02/12/2019 and the wound had enlarged somewhat.  A referral was made for the home health nurse to do the dressing change.  He comes in for a follow-up visit.  He states that the wound is not improving.  He has rest pain of the left foot.  He does continue to smoke but is trying to cut back.  His activity is very limited and he ambulates with a walker.  Past Medical History:  Diagnosis Date  . Abnormal rate of speech    post stroke  . Anticoagulant long-term use    plavix  . Arthritis of left shoulder region   . At high risk for falls   . Chronic constipation   . Chronic kidney disease    chronic kidney disease   . Cognitive deficit due to old cerebral infarction   . Constipation   . DDD (degenerative disc disease), cervical   . Degenerative cervical spinal stenosis   . Dysphagia   . Frequency of urination   . Gait disturbance, post-stroke    uses cane  . History of ischemic left MCA stroke  06/27/2013   w/ evidence hemorrhagia, high grade stenosis bilateral proximal MCA and extensive small matter disease--- symptoms resolved  . History of ischemic stroke without residual deficits 05-14-2015---  residual left hemiparesis and cognitive deficits   small nonhemorrhegic infarct right corona radiata posterior limb and progressive right m2 stenosis  . Hyperlipidemia   . Hypertension   . Indwelling urethral catheter present   . Insomnia   . Internal hemorrhoids   . Left hemiparesis (HCC)    RESIDUAL FROM STROKE--  WALKS W/ CANE  . Mild atherosclerosis of carotid artery, right    RICA 0-39% per duplex 06-28-2013  . Night sweats   . OA (osteoarthritis)    LEFT SHOULDER  . Painful bladder spasm   . Peripheral arterial disease ([place])   . Prostate cancer [facility]) urologist-  dr Gaynelle Arabian  oncologist-  dr [name]   dx 12/ 2017-- Stage T1c, Gleason 3+4,  PSA 10.4,  vol 51cc  . Sensorineural hearing loss, bilateral   . Type 2 diabetes mellitus treated with insulin ([place])   . Urge incontinence    wears depends  . Urinary retention   . Wears dentures     Family History  Problem Relation Age of Onset  . Hypertension Mother   . Hypertension Father   .  Cancer Brother        prostate    SOCIAL HISTORY: Social History   Tobacco Use  . Smoking status: Current Some Day Smoker    Packs/day: 0.25    Years: 45.00    Pack years: 11.25    Types: Cigarettes  . Smokeless tobacco: Never Used  . Tobacco comment: continues to smoke approx. 1 to 2 cig's per month  Substance Use Topics  . Alcohol use: No    No Known Allergies  Current Outpatient Medications  Medication Sig Dispense Refill  . acetaminophen (TYLENOL) 500 MG tablet Take 1,000 mg by mouth 3 (three) times daily.    Marland Kitchen alfuzosin (UROXATRAL) 10 MG 24 hr tablet Take 10 mg by mouth daily with breakfast.    . amLODipine (NORVASC) 5 MG tablet Take 5 mg by mouth daily.    Marland Kitchen aspirin EC 81 MG tablet Take 81 mg by mouth daily.    Marland Kitchen  atorvastatin (LIPITOR) 20 MG tablet Take 20 mg by mouth daily.     . clopidogrel (PLAVIX) 75 MG tablet Take 1 tablet (75 mg total) by mouth daily with breakfast. 30 tablet 6  . donepezil (ARICEPT) 10 MG tablet Take 10 mg by mouth at bedtime.    . fexofenadine (ALLEGRA) 60 MG tablet Take 60 mg by mouth 2 (two) times daily as needed for allergies or rhinitis.    Marland Kitchen gabapentin (NEURONTIN) 800 MG tablet Take 800 mg by mouth at bedtime.    Marland Kitchen glipiZIDE (GLUCOTROL XL) 5 MG 24 hr tablet Take 5 mg by mouth daily with breakfast.    . losartan (COZAAR) 50 MG tablet Take 50 mg by mouth daily.     . metFORMIN (GLUCOPHAGE) 500 MG tablet Take 500 mg by mouth 2 (two) times daily with a meal.     . oxyCODONE (OXY IR/ROXICODONE) 5 MG immediate release tablet Take 5 mg by mouth 3 (three) times daily as needed for severe pain.    Marland Kitchen rOPINIRole (REQUIP) 0.5 MG tablet Take 0.5 mg by mouth at bedtime.    . sennosides-docusate sodium (SENOKOT-S) 8.6-50 MG tablet Take 1 tablet by mouth at bedtime.     . traZODone (DESYREL) 100 MG tablet Take 100 mg by mouth at bedtime.     No current facility-administered medications for this visit.    REVIEW OF SYSTEMS:  [X]  denotes positive finding, [ ]  denotes negative finding Cardiac  Comments:  Chest pain or chest pressure:    Shortness of breath upon exertion:    Short of breath when lying flat:    Irregular heart rhythm:        Vascular    Pain in calf, thigh, or hip brought on by ambulation:    Pain in feet at night that wakes you up from your sleep:  x   Blood clot in your veins:    Leg swelling:         Pulmonary    Oxygen at home:    Productive cough:     Wheezing:         Neurologic    Sudden weakness in arms or legs:     Sudden numbness in arms or legs:     Sudden onset of difficulty speaking or slurred speech:    Temporary loss of vision in one eye:     Problems with dizziness:         Gastrointestinal    Blood in stool:     Vomited blood:  Genitourinary    Burning when urinating:     Blood in urine:        Psychiatric    Major depression:         Hematologic    Bleeding problems:    Problems with blood clotting too easily:        Skin    Rashes or ulcers: x       Constitutional    Fever or chills:     PHYSICAL EXAM:   Vitals:   03/25/19 1407  BP: (!) 93/57  Pulse: 85  Resp: 18  Temp: 98.3 F (36.8 C)  TempSrc: Temporal  SpO2: 98%  Weight: 154 lb 8 oz (70.1 kg)  Height: 5\' 8"  (1.727 m)    GENERAL: The patient is a well-nourished male, in no acute distress. The vital signs are documented above. CARDIAC: There is a regular rate and rhythm.  VASCULAR: I do not detect carotid bruits. On the right side he has a palpable femoral pulse.  I cannot palpate a popliteal or pedal pulses.  He has a brisk anterior tibial and peroneal signal with a Doppler on the right. On the left side, which is the side of concern, he has a palpable femoral pulse.  I cannot palpate a popliteal or pedal pulses.  He has a monophasic peroneal signal with a Doppler and also a posterior tibial signal with a Doppler. PULMONARY: There is good air exchange bilaterally without wheezing or rales. ABDOMEN: Soft and non-tender with normal pitched bowel sounds.  MUSCULOSKELETAL: There are no major deformities or cyanosis. NEUROLOGIC: No focal weakness or paresthesias are detected. SKIN: The wound on the dorsum of his left foot is slightly larger.  This measures 2 cm in length by 1.5 cm in width, by 1 cm in depth.    PSYCHIATRIC: The patient has a normal affect.  DATA:    LABS: His creatinine on 12/11/2018 was normal (0.90)  ARTERIOGRAM: I reviewed his previous arteriogram that was done on 12/11/2018.  He underwent successful angioplasty and stenting of the left superficial femoral artery.  However below that he has single-vessel runoff only via the peroneal artery which is occluded distally.  MEDICAL ISSUES:   CRITICAL LIMB ISCHEMIA: I  reviewed the patient's previous arteriogram.  He has peroneal runoff only on the left.  The artery occludes distally.  I explained that given his poor perfusion there likely no further options for revascularization and he will likely require an amputation.  He feels strongly about not having an amputation.  I think it would be reasonable to proceed with an arteriogram to see if there is anything we can do from an endovascular approach to try to improve distal perfusion.  I have discussed the indications for the procedure and the potential complications and he is agreeable to proceed.  His procedure is scheduled for tomorrow.  His renal function is normal.  Deitra Mayo [facility] [phone]

## 2019-03-25 NOTE — H&P (View-Only) (Signed)
Patient name: Billy Torres MRN: [id number] DOB: [birth year] Sex: male  REASON FOR VISIT:   Nonhealing wound on foot.  HPI:   Billy Torres is a pleasant 64 y.o. male who I last saw on 01/27/2019.  He had been admitted to the hospital with a nonhealing wound on the left foot and evidence of infrainguinal arterial occlusive disease.  On 12/11/2018 he underwent angioplasty and stenting of the left superficial femoral artery with a drug-coated balloon.  He had an excellent result with no residual stenosis.  There was single-vessel runoff on the left via the peroneal artery only.  When I saw him last he felt that the wound on the dorsum of his left foot was improving.  Duplex at that time showed some stenosis proximal to the area that was angioplastied.  On the left side, he had an ABI of 100% with a toe pressure of 69 mmHg.  I was worried about the wound on the plantar aspect of his left foot especially given that he has peroneal runoff only.  He was subsequently seen by Risa Grill, PA on 02/12/2019 and the wound had enlarged somewhat.  A referral was made for the home health nurse to do the dressing change.  He comes in for a follow-up visit.  He states that the wound is not improving.  He has rest pain of the left foot.  He does continue to smoke but is trying to cut back.  His activity is very limited and he ambulates with a walker.  Past Medical History:  Diagnosis Date  . Abnormal rate of speech    post stroke  . Anticoagulant long-term use    plavix  . Arthritis of left shoulder region   . At high risk for falls   . Chronic constipation   . Chronic kidney disease    chronic kidney disease   . Cognitive deficit due to old cerebral infarction   . Constipation   . DDD (degenerative disc disease), cervical   . Degenerative cervical spinal stenosis   . Dysphagia   . Frequency of urination   . Gait disturbance, post-stroke    uses cane  . History of ischemic left MCA stroke  06/27/2013   w/ evidence hemorrhagia, high grade stenosis bilateral proximal MCA and extensive small matter disease--- symptoms resolved  . History of ischemic stroke without residual deficits 05-14-2015---  residual left hemiparesis and cognitive deficits   small nonhemorrhegic infarct right corona radiata posterior limb and progressive right m2 stenosis  . Hyperlipidemia   . Hypertension   . Indwelling urethral catheter present   . Insomnia   . Internal hemorrhoids   . Left hemiparesis (HCC)    RESIDUAL FROM STROKE--  WALKS W/ CANE  . Mild atherosclerosis of carotid artery, right    RICA 0-39% per duplex 06-28-2013  . Night sweats   . OA (osteoarthritis)    LEFT SHOULDER  . Painful bladder spasm   . Peripheral arterial disease ([place])   . Prostate cancer [facility]) urologist-  dr Gaynelle Arabian  oncologist-  dr [name]   dx 12/ 2017-- Stage T1c, Gleason 3+4,  PSA 10.4,  vol 51cc  . Sensorineural hearing loss, bilateral   . Type 2 diabetes mellitus treated with insulin ([place])   . Urge incontinence    wears depends  . Urinary retention   . Wears dentures     Family History  Problem Relation Age of Onset  . Hypertension Mother   . Hypertension Father   .  Cancer Brother        prostate    SOCIAL HISTORY: Social History   Tobacco Use  . Smoking status: Current Some Day Smoker    Packs/day: 0.25    Years: 45.00    Pack years: 11.25    Types: Cigarettes  . Smokeless tobacco: Never Used  . Tobacco comment: continues to smoke approx. 1 to 2 cig's per month  Substance Use Topics  . Alcohol use: No    No Known Allergies  Current Outpatient Medications  Medication Sig Dispense Refill  . acetaminophen (TYLENOL) 500 MG tablet Take 1,000 mg by mouth 3 (three) times daily.    Marland Kitchen alfuzosin (UROXATRAL) 10 MG 24 hr tablet Take 10 mg by mouth daily with breakfast.    . amLODipine (NORVASC) 5 MG tablet Take 5 mg by mouth daily.    Marland Kitchen aspirin EC 81 MG tablet Take 81 mg by mouth daily.    Marland Kitchen  atorvastatin (LIPITOR) 20 MG tablet Take 20 mg by mouth daily.     . clopidogrel (PLAVIX) 75 MG tablet Take 1 tablet (75 mg total) by mouth daily with breakfast. 30 tablet 6  . donepezil (ARICEPT) 10 MG tablet Take 10 mg by mouth at bedtime.    . fexofenadine (ALLEGRA) 60 MG tablet Take 60 mg by mouth 2 (two) times daily as needed for allergies or rhinitis.    Marland Kitchen gabapentin (NEURONTIN) 800 MG tablet Take 800 mg by mouth at bedtime.    Marland Kitchen glipiZIDE (GLUCOTROL XL) 5 MG 24 hr tablet Take 5 mg by mouth daily with breakfast.    . losartan (COZAAR) 50 MG tablet Take 50 mg by mouth daily.     . metFORMIN (GLUCOPHAGE) 500 MG tablet Take 500 mg by mouth 2 (two) times daily with a meal.     . oxyCODONE (OXY IR/ROXICODONE) 5 MG immediate release tablet Take 5 mg by mouth 3 (three) times daily as needed for severe pain.    Marland Kitchen rOPINIRole (REQUIP) 0.5 MG tablet Take 0.5 mg by mouth at bedtime.    . sennosides-docusate sodium (SENOKOT-S) 8.6-50 MG tablet Take 1 tablet by mouth at bedtime.     . traZODone (DESYREL) 100 MG tablet Take 100 mg by mouth at bedtime.     No current facility-administered medications for this visit.    REVIEW OF SYSTEMS:  [X]  denotes positive finding, [ ]  denotes negative finding Cardiac  Comments:  Chest pain or chest pressure:    Shortness of breath upon exertion:    Short of breath when lying flat:    Irregular heart rhythm:        Vascular    Pain in calf, thigh, or hip brought on by ambulation:    Pain in feet at night that wakes you up from your sleep:  x   Blood clot in your veins:    Leg swelling:         Pulmonary    Oxygen at home:    Productive cough:     Wheezing:         Neurologic    Sudden weakness in arms or legs:     Sudden numbness in arms or legs:     Sudden onset of difficulty speaking or slurred speech:    Temporary loss of vision in one eye:     Problems with dizziness:         Gastrointestinal    Blood in stool:     Vomited blood:  Genitourinary    Burning when urinating:     Blood in urine:        Psychiatric    Major depression:         Hematologic    Bleeding problems:    Problems with blood clotting too easily:        Skin    Rashes or ulcers: x       Constitutional    Fever or chills:     PHYSICAL EXAM:   Vitals:   03/25/19 1407  BP: (!) 93/57  Pulse: 85  Resp: 18  Temp: 98.3 F (36.8 C)  TempSrc: Temporal  SpO2: 98%  Weight: 154 lb 8 oz (70.1 kg)  Height: 5\' 8"  (1.727 m)    GENERAL: The patient is a well-nourished male, in no acute distress. The vital signs are documented above. CARDIAC: There is a regular rate and rhythm.  VASCULAR: I do not detect carotid bruits. On the right side he has a palpable femoral pulse.  I cannot palpate a popliteal or pedal pulses.  He has a brisk anterior tibial and peroneal signal with a Doppler on the right. On the left side, which is the side of concern, he has a palpable femoral pulse.  I cannot palpate a popliteal or pedal pulses.  He has a monophasic peroneal signal with a Doppler and also a posterior tibial signal with a Doppler. PULMONARY: There is good air exchange bilaterally without wheezing or rales. ABDOMEN: Soft and non-tender with normal pitched bowel sounds.  MUSCULOSKELETAL: There are no major deformities or cyanosis. NEUROLOGIC: No focal weakness or paresthesias are detected. SKIN: The wound on the dorsum of his left foot is slightly larger.  This measures 2 cm in length by 1.5 cm in width, by 1 cm in depth.    PSYCHIATRIC: The patient has a normal affect.  DATA:    LABS: His creatinine on 12/11/2018 was normal (0.90)  ARTERIOGRAM: I reviewed his previous arteriogram that was done on 12/11/2018.  He underwent successful angioplasty and stenting of the left superficial femoral artery.  However below that he has single-vessel runoff only via the peroneal artery which is occluded distally.  MEDICAL ISSUES:   CRITICAL LIMB ISCHEMIA: I  reviewed the patient's previous arteriogram.  He has peroneal runoff only on the left.  The artery occludes distally.  I explained that given his poor perfusion there likely no further options for revascularization and he will likely require an amputation.  He feels strongly about not having an amputation.  I think it would be reasonable to proceed with an arteriogram to see if there is anything we can do from an endovascular approach to try to improve distal perfusion.  I have discussed the indications for the procedure and the potential complications and he is agreeable to proceed.  His procedure is scheduled for tomorrow.  His renal function is normal.  Deitra Mayo [facility] [phone]

## 2019-03-26 ENCOUNTER — Encounter (HOSPITAL_BASED_OUTPATIENT_CLINIC_OR_DEPARTMENT_OTHER): Payer: Medicare (Managed Care) | Admitting: Internal Medicine

## 2019-03-26 ENCOUNTER — Other Ambulatory Visit: Payer: Self-pay

## 2019-04-01 ENCOUNTER — Other Ambulatory Visit (HOSPITAL_COMMUNITY)
Admission: RE | Admit: 2019-04-01 | Discharge: 2019-04-01 | Disposition: A | Discharge status: Home / Self Care | Payer: Medicare (Managed Care) | Source: Ambulatory Visit | Attending: Vascular Surgery | Admitting: Vascular Surgery

## 2019-04-01 DIAGNOSIS — I6521 Occlusion and stenosis of right carotid artery: Secondary | ICD-10-CM | POA: Diagnosis not present

## 2019-04-01 DIAGNOSIS — F17210 Nicotine dependence, cigarettes, uncomplicated: Secondary | ICD-10-CM | POA: Diagnosis not present

## 2019-04-01 DIAGNOSIS — N189 Chronic kidney disease, unspecified: Secondary | ICD-10-CM | POA: Diagnosis not present

## 2019-04-01 DIAGNOSIS — R1319 Other dysphagia: Secondary | ICD-10-CM | POA: Diagnosis not present

## 2019-04-01 DIAGNOSIS — Z79899 Other long term (current) drug therapy: Secondary | ICD-10-CM | POA: Diagnosis not present

## 2019-04-01 DIAGNOSIS — Z8546 Personal history of malignant neoplasm of prostate: Secondary | ICD-10-CM | POA: Diagnosis not present

## 2019-04-01 DIAGNOSIS — E785 Hyperlipidemia, unspecified: Secondary | ICD-10-CM | POA: Diagnosis not present

## 2019-04-01 DIAGNOSIS — Z7982 Long term (current) use of aspirin: Secondary | ICD-10-CM | POA: Diagnosis not present

## 2019-04-01 DIAGNOSIS — L97529 Non-pressure chronic ulcer of other part of left foot with unspecified severity: Secondary | ICD-10-CM | POA: Diagnosis not present

## 2019-04-01 DIAGNOSIS — Z20822 Contact with and (suspected) exposure to covid-19: Secondary | ICD-10-CM | POA: Diagnosis not present

## 2019-04-01 DIAGNOSIS — E1151 Type 2 diabetes mellitus with diabetic peripheral angiopathy without gangrene: Secondary | ICD-10-CM | POA: Diagnosis not present

## 2019-04-01 DIAGNOSIS — Z8673 Personal history of transient ischemic attack (TIA), and cerebral infarction without residual deficits: Secondary | ICD-10-CM | POA: Diagnosis not present

## 2019-04-01 DIAGNOSIS — I129 Hypertensive chronic kidney disease with stage 1 through stage 4 chronic kidney disease, or unspecified chronic kidney disease: Secondary | ICD-10-CM | POA: Diagnosis not present

## 2019-04-01 DIAGNOSIS — I70245 Atherosclerosis of native arteries of left leg with ulceration of other part of foot: Secondary | ICD-10-CM | POA: Diagnosis present

## 2019-04-01 DIAGNOSIS — Z7984 Long term (current) use of oral hypoglycemic drugs: Secondary | ICD-10-CM | POA: Diagnosis not present

## 2019-04-01 DIAGNOSIS — Z7902 Long term (current) use of antithrombotics/antiplatelets: Secondary | ICD-10-CM | POA: Diagnosis not present

## 2019-04-01 LAB — SARS CORONAVIRUS 2 (TAT 6-24 HRS): SARS Coronavirus 2: NEGATIVE

## 2019-04-02 ENCOUNTER — Encounter: Payer: Self-pay | Admitting: Vascular Surgery

## 2019-04-02 ENCOUNTER — Other Ambulatory Visit: Payer: Self-pay

## 2019-04-02 ENCOUNTER — Encounter (HOSPITAL_COMMUNITY)
Admission: RE | Disposition: A | Discharge status: Home / Self Care | Payer: Self-pay | Source: Ambulatory Visit | Attending: Vascular Surgery

## 2019-04-02 ENCOUNTER — Ambulatory Visit (HOSPITAL_COMMUNITY)
Admission: RE | Admit: 2019-04-02 | Discharge: 2019-04-02 | Disposition: A | Discharge status: Home / Self Care | Payer: Medicare (Managed Care) | Source: Ambulatory Visit | Attending: Vascular Surgery | Admitting: Vascular Surgery

## 2019-04-02 DIAGNOSIS — Z7902 Long term (current) use of antithrombotics/antiplatelets: Secondary | ICD-10-CM | POA: Insufficient documentation

## 2019-04-02 DIAGNOSIS — I129 Hypertensive chronic kidney disease with stage 1 through stage 4 chronic kidney disease, or unspecified chronic kidney disease: Secondary | ICD-10-CM | POA: Insufficient documentation

## 2019-04-02 DIAGNOSIS — N189 Chronic kidney disease, unspecified: Secondary | ICD-10-CM | POA: Insufficient documentation

## 2019-04-02 DIAGNOSIS — R1319 Other dysphagia: Secondary | ICD-10-CM | POA: Insufficient documentation

## 2019-04-02 DIAGNOSIS — L97529 Non-pressure chronic ulcer of other part of left foot with unspecified severity: Secondary | ICD-10-CM | POA: Insufficient documentation

## 2019-04-02 DIAGNOSIS — Z20822 Contact with and (suspected) exposure to covid-19: Secondary | ICD-10-CM | POA: Insufficient documentation

## 2019-04-02 DIAGNOSIS — E1151 Type 2 diabetes mellitus with diabetic peripheral angiopathy without gangrene: Secondary | ICD-10-CM | POA: Insufficient documentation

## 2019-04-02 DIAGNOSIS — Z8546 Personal history of malignant neoplasm of prostate: Secondary | ICD-10-CM | POA: Insufficient documentation

## 2019-04-02 DIAGNOSIS — I6521 Occlusion and stenosis of right carotid artery: Secondary | ICD-10-CM | POA: Insufficient documentation

## 2019-04-02 DIAGNOSIS — E785 Hyperlipidemia, unspecified: Secondary | ICD-10-CM | POA: Insufficient documentation

## 2019-04-02 DIAGNOSIS — Z7984 Long term (current) use of oral hypoglycemic drugs: Secondary | ICD-10-CM | POA: Insufficient documentation

## 2019-04-02 DIAGNOSIS — I70244 Atherosclerosis of native arteries of left leg with ulceration of heel and midfoot: Secondary | ICD-10-CM

## 2019-04-02 DIAGNOSIS — F17210 Nicotine dependence, cigarettes, uncomplicated: Secondary | ICD-10-CM | POA: Insufficient documentation

## 2019-04-02 DIAGNOSIS — Z7982 Long term (current) use of aspirin: Secondary | ICD-10-CM | POA: Insufficient documentation

## 2019-04-02 DIAGNOSIS — I70245 Atherosclerosis of native arteries of left leg with ulceration of other part of foot: Secondary | ICD-10-CM | POA: Diagnosis not present

## 2019-04-02 DIAGNOSIS — Z79899 Other long term (current) drug therapy: Secondary | ICD-10-CM | POA: Insufficient documentation

## 2019-04-02 DIAGNOSIS — Z8673 Personal history of transient ischemic attack (TIA), and cerebral infarction without residual deficits: Secondary | ICD-10-CM | POA: Insufficient documentation

## 2019-04-02 HISTORY — PX: ABDOMINAL AORTOGRAM W/LOWER EXTREMITY: CATH118223

## 2019-04-02 LAB — POCT I-STAT, CHEM 8
BUN: 18 mg/dL (ref 8–23)
Calcium, Ion: 1.24 mmol/L (ref 1.15–1.40)
Chloride: 100 mmol/L (ref 98–111)
Creatinine, Ser: 1 mg/dL (ref 0.61–1.24)
Glucose, Bld: 97 mg/dL (ref 70–99)
HCT: 42 % (ref 39.0–52.0)
Hemoglobin: 14.3 g/dL (ref 13.0–17.0)
Potassium: 4.4 mmol/L (ref 3.5–5.1)
Sodium: 137 mmol/L (ref 135–145)
TCO2: 30 mmol/L (ref 22–32)

## 2019-04-02 LAB — GLUCOSE, CAPILLARY: Glucose-Capillary: 98 mg/dL (ref 70–99)

## 2019-04-02 SURGERY — ABDOMINAL AORTOGRAM W/LOWER EXTREMITY
Anesthesia: LOCAL | Laterality: Bilateral

## 2019-04-02 MED ORDER — MIDAZOLAM HCL 2 MG/2ML IJ SOLN
INTRAMUSCULAR | Status: DC | PRN
  Administered 2019-04-02: 1 mg via INTRAVENOUS

## 2019-04-02 MED ORDER — SODIUM CHLORIDE 0.9% FLUSH: 3.0000 mL | INTRAVENOUS | Status: DC | PRN

## 2019-04-02 MED ORDER — LIDOCAINE HCL (PF) 1 % IJ SOLN
INTRAMUSCULAR | Status: AC
  Filled 2019-04-02: qty 30

## 2019-04-02 MED ORDER — FENTANYL CITRATE (PF) 100 MCG/2ML IJ SOLN
INTRAMUSCULAR | Status: AC
  Filled 2019-04-02: qty 2

## 2019-04-02 MED ORDER — SODIUM CHLORIDE 0.9 % IV SOLN: INTRAVENOUS | Status: DC

## 2019-04-02 MED ORDER — ACETAMINOPHEN 325 MG PO TABS
650.0000 mg | ORAL_TABLET | ORAL | Status: DC | PRN
  Administered 2019-04-02: 650 mg via ORAL
  Filled 2019-04-02: qty 2

## 2019-04-02 MED ORDER — SODIUM CHLORIDE 0.9 % WEIGHT BASED INFUSION: 1.0000 mL/kg/h | INTRAVENOUS | Status: DC

## 2019-04-02 MED ORDER — LABETALOL HCL 5 MG/ML IV SOLN: 10.0000 mg | INTRAVENOUS | Status: DC | PRN

## 2019-04-02 MED ORDER — MIDAZOLAM HCL 2 MG/2ML IJ SOLN
INTRAMUSCULAR | Status: AC
  Filled 2019-04-02: qty 2

## 2019-04-02 MED ORDER — SODIUM CHLORIDE 0.9% FLUSH: 3.0000 mL | Freq: Two times a day (BID) | INTRAVENOUS | Status: DC

## 2019-04-02 MED ORDER — HYDRALAZINE HCL 20 MG/ML IJ SOLN: 5.0000 mg | INTRAMUSCULAR | Status: DC | PRN

## 2019-04-02 MED ORDER — SODIUM CHLORIDE 0.9 % IV SOLN: 250.0000 mL | INTRAVENOUS | Status: DC | PRN

## 2019-04-02 MED ORDER — LIDOCAINE HCL (PF) 1 % IJ SOLN
INTRAMUSCULAR | Status: DC | PRN
  Administered 2019-04-02: 18 mL via INTRADERMAL

## 2019-04-02 MED ORDER — HEPARIN (PORCINE) IN NACL 1000-0.9 UT/500ML-% IV SOLN
INTRAVENOUS | Status: DC | PRN
  Administered 2019-04-02 (×2): 500 mL

## 2019-04-02 MED ORDER — ONDANSETRON HCL 4 MG/2ML IJ SOLN: 4.0000 mg | Freq: Four times a day (QID) | INTRAMUSCULAR | Status: DC | PRN

## 2019-04-02 MED ORDER — FENTANYL CITRATE (PF) 100 MCG/2ML IJ SOLN
INTRAMUSCULAR | Status: DC | PRN
  Administered 2019-04-02: 50 ug via INTRAVENOUS

## 2019-04-02 MED ORDER — IODIXANOL 320 MG/ML IV SOLN
INTRAVENOUS | Status: DC | PRN
  Administered 2019-04-02: 120 mL

## 2019-04-02 MED ORDER — HEPARIN (PORCINE) IN NACL 1000-0.9 UT/500ML-% IV SOLN
INTRAVENOUS | Status: AC
  Filled 2019-04-02: qty 1000

## 2019-04-02 SURGICAL SUPPLY — 12 items
CATH ANGIO 5F PIGTAIL 65CM (CATHETERS) ×2 IMPLANT
CATH CROSS OVER TEMPO 5F (CATHETERS) ×1 IMPLANT
CATH STRAIGHT 5FR 65CM (CATHETERS) ×2 IMPLANT
CLOSURE MYNX CONTROL 5F (Vascular Products) ×1 IMPLANT
KIT MICROPUNCTURE NIT STIFF (SHEATH) ×1 IMPLANT
KIT PV (KITS) ×2 IMPLANT
SHEATH PINNACLE 5F 10CM (SHEATH) ×1 IMPLANT
SHEATH PROBE COVER 6X72 (BAG) ×1 IMPLANT
SYR MEDRAD MARK V 150ML (SYRINGE) ×1 IMPLANT
TRANSDUCER W/STOPCOCK (MISCELLANEOUS) ×2 IMPLANT
TRAY PV CATH (CUSTOM PROCEDURE TRAY) ×2 IMPLANT
WIRE BENTSON .035X145CM (WIRE) ×2 IMPLANT

## 2019-04-02 NOTE — Discharge Instructions (Signed)
HOLD METFORMIN// RESTART Monday MORNING 3/29  Angiogram, Care After This sheet gives you information about how to care for yourself after your procedure. Your health care provider may also give you more specific instructions. If you have problems or questions, contact your health care provider. What can I expect after the procedure? After the procedure, it is common to have bruising and tenderness at the catheter insertion area. Follow these instructions at home: Insertion site care  Follow instructions from your health care provider about how to take care of your insertion site. Make sure you: ? Wash your hands with soap and water before you change your bandage (dressing). If soap and water are not available, use hand sanitizer. ? Change your dressing as told by your health care provider. ? Leave stitches (sutures), skin glue, or adhesive strips in place. These skin closures may need to stay in place for 2 weeks or longer. If adhesive strip edges start to loosen and curl up, you may trim the loose edges. Do not remove adhesive strips completely unless your health care provider tells you to do that.  Do not take baths, swim, or use a hot tub until your health care provider approves.  You may shower 24-48 hours after the procedure or as told by your health care provider. ? Gently wash the site with plain soap and water. ? Pat the area dry with a clean towel. ? Do not rub the site. This may cause bleeding.  Do not apply powder or lotion to the site. Keep the site clean and dry.  Check your insertion site every day for signs of infection. Check for: ? Redness, swelling, or pain. ? Fluid or blood. ? Warmth. ? Pus or a bad smell. Activity  Rest as told by your health care provider, usually for 1-2 days.  Do not lift anything that is heavier than 10 lbs. (4.5 kg) or as told by your health care provider.  Do not drive for 24 hours if you were given a medicine to help you relax  (sedative).  Do not drive or use heavy machinery while taking prescription pain medicine. General instructions   Return to your normal activities as told by your health care provider, usually in about a week. Ask your health care provider what activities are safe for you.  If the catheter site starts bleeding, lie flat and put pressure on the site. If the bleeding does not stop, get help right away. This is a medical emergency.  Drink enough fluid to keep your urine clear or pale yellow. This helps flush the contrast dye from your body.  Take over-the-counter and prescription medicines only as told by your health care provider.  Keep all follow-up visits as told by your health care provider. This is important. Contact a health care provider if:  You have a fever or chills.  You have redness, swelling, or pain around your insertion site.  You have fluid or blood coming from your insertion site.  The insertion site feels warm to the touch.  You have pus or a bad smell coming from your insertion site.  You have bruising around the insertion site.  You notice blood collecting in the tissue around the catheter site (hematoma). The hematoma may be painful to the touch. Get help right away if:  You have severe pain at the catheter insertion area.  The catheter insertion area swells very fast.  The catheter insertion area is bleeding, and the bleeding does not stop when  you hold steady pressure on the area.  The area near or just beyond the catheter insertion site becomes pale, cool, tingly, or numb. These symptoms may represent a serious problem that is an emergency. Do not wait to see if the symptoms will go away. Get medical help right away. Call your local emergency services (911 in the [place] Do not drive yourself to the hospital. Summary  After the procedure, it is common to have bruising and tenderness at the catheter insertion area.  After the procedure, it is important to  rest and drink plenty of fluids.  Do not take baths, swim, or use a hot tub until your health care provider says it is okay to do so. You may shower 24-48 hours after the procedure or as told by your health care provider.  If the catheter site starts bleeding, lie flat and put pressure on the site. If the bleeding does not stop, get help right away. This is a medical emergency. This information is not intended to replace advice given to you by your health care provider. Make sure you discuss any questions you have with your health care provider. Document Revised: 12/06/2016 Document Reviewed: 11/29/2015 Elsevier Patient Education  2020 [employer].

## 2019-04-02 NOTE — Op Note (Signed)
PATIENT: [name]      MRN: [id number] DOB: [birth year]    DATE OF PROCEDURE: 04/02/2019  INDICATIONS:    Billy Torres is a 64 y.o. male who presents with a nonhealing wound of his left foot on the dorsum.  He is undergone previous angioplasty of the left superficial femoral artery.  He has peroneal runoff only.  We discussed primary amputation which she is very much opposed to.  He presents for arteriography to see if there are any further options for revascularization on the left.  PROCEDURE:    1.  Ultrasound-guided access to the right common femoral artery 2.  Aortogram with bilateral iliac arteriogram 3.  Selective catheterization of the left external iliac artery with left lower extremity runoff (second order catheterization) 4.  Mynx closure right common femoral artery  SURGEON: Judeth Cornfield. Scot Dock, MD, FACS  ANESTHESIA: Local with sedation  EBL: Minimal  TECHNIQUE: The patient was brought to the peripheral vascular lab and was sedated. The period of conscious sedation was 31 minutes.  During that time period, I was present face-to-face 100% of the time.  The patient was administered 1 mg of Versed and 50 mcg of fentanyl. The patient's heart rate, blood pressure, and oxygen saturation were monitored by the nurse continuously during the procedure.  Both groins were prepped and draped in the usual sterile fashion.  Under ultrasound guidance, after the skin was anesthetized, I cannulated the rightcommon femoral artery with a micropuncture needle and a micropuncture sheath was introduced over a wire.  This was exchanged for a 5 [place] sheath over a Bentson wire.  By ultrasound the femoral artery was patent. A real-time image was obtained and sent to the server.  Pigtail catheter was positioned at the L1 vertebral body and flush aortogram obtained.  The catheter was in position above the aortic bifurcation and exchanged for a crossover catheter which was positioned into the  left common iliac artery.  I then advanced the wire into the external iliac artery and exchanged the crossover catheter for a straight catheter.  Selective left external iliac arteriogram was obtained with left lower extremity runoff.  This catheter was then removed and a retrograde right femoral arteriogram obtained.  At the completion of the procedure the right groin and catheter were prepped and then the minx closure device advanced through the sheath.  The device was deployed and after 2 minutes the device was removed with good hemostasis.  Pressure was held for 5 minutes.  The patient was transferred to the holding area.  No immediate complications were noted.  FINDINGS:   1.  There are 2 renal arteries bilaterally with no significant renal artery stenosis. 2.  The infrarenal aorta, bilateral common iliac arteries and external iliac arteries are widely patent. 3.  On the left side, which is the side of concern, the common femoral and deep femoral artery are patent.  The superficial femoral artery is patent including the previously angioplastied segment.  The popliteal artery is patent.  There is single-vessel runoff via the peroneal artery.  The anterior tibial and posterior tibial arteries are occluded.  The peroneal artery becomes small distally but is patent. 4.  On the right side, the common femoral and deep femoral artery are patent.  There is moderate diffuse disease throughout the proximal superficial femoral artery down to the mid thigh where there is a short segment occlusion.  Below that the superficial femoral artery and popliteal artery are patent.  The single-vessel  runoff on the right via the peroneal artery.  There is an occluded anterior tibial and posterior tibial artery.  There is disease in the tibial peroneal trunk and also in the proximal peroneal artery.  CLINICAL NOTE: There are no further options for revascularization on the left.  If the wound progresses he would require a  below the knee amputation.   Billy Mayo, MD, FACS [facility]  DATE OF DICTATION:   04/02/2019

## 2019-04-02 NOTE — Interval H&P Note (Signed)
History and Physical Interval Note:  04/02/2019 7:35 AM  Billy Torres  has presented today for surgery, with the diagnosis of Athroscloosis of artery extermity with ulcer.  The various methods of treatment have been discussed with the patient and family. After consideration of risks, benefits and other options for treatment, the patient has consented to  Procedure(s): ABDOMINAL AORTOGRAM W/LOWER EXTREMITY (Bilateral) as a surgical intervention.  The patient's history has been reviewed, patient examined, no change in status, stable for surgery.  I have reviewed the patient's chart and labs.  Questions were answered to the patient's satisfaction.     Deitra Mayo

## 2019-04-05 ENCOUNTER — Encounter (HOSPITAL_BASED_OUTPATIENT_CLINIC_OR_DEPARTMENT_OTHER): Payer: Medicare (Managed Care) | Admitting: Internal Medicine

## 2019-04-05 ENCOUNTER — Other Ambulatory Visit: Payer: Self-pay

## 2019-04-05 DIAGNOSIS — E11621 Type 2 diabetes mellitus with foot ulcer: Secondary | ICD-10-CM | POA: Diagnosis not present

## 2019-04-05 NOTE — Progress Notes (Signed)
ALVAH, LAGROW ([id number]) Visit Report for 04/05/2019 Arrival Information Details Patient Name: Date of Service: Billy Torres, Billy Torres 04/05/2019 3:30 PM Medical Record [id number] Patient Account Number: [id number] Date of Birth/Sex: Treating RN: 01/15/1955 (64 y.o. Marvis Repress Primary Care Skylen Spiering: Dorian Pod Other Clinician: Referring Marcellene Shivley: Treating Avneet Ashmore/Extender:Robson, Dulcy Fanny, JULIE Weeks in Treatment: 3 Visit Information History Since Last Visit Added or deleted any medications: No Patient Arrived: Walker Any new allergies or adverse reactions: No Arrival Time: 15:31 Had a fall or experienced change in No Accompanied By: self activities of daily living that may affect Transfer Assistance: None risk of falls: Patient Identification Verified: Yes Signs or symptoms of abuse/neglect since last No Secondary Verification Process Yes visito Completed: Hospitalized since last visit: No Patient Requires Transmission- No Implantable device outside of the clinic excluding No Based Precautions: cellular tissue based products placed in the center Patient Has Alerts: Yes since last visit: Patient Alerts: Patient on Blood Has Dressing in Place as Prescribed: Yes Thinner Pain Present Now: Yes L ABI = 1.22 TBI = 0.55 (01/2019) Electronic Signature(s) Signed: 04/05/2019 5:06:29 PM By: Kela Millin Entered By: Kela Millin on 04/05/2019 15:31:34 -------------------------------------------------------------------------------- Clinic Level of Care Assessment Details Patient Name: Date of Service: PROMISE, BUSHONG 04/05/2019 3:30 PM Medical Record [id number] Patient Account Number: [id number] Date of Birth/Sex: Treating RN: 05/06/1955 (64 y.o. Ernestene Mention Primary Care Monteen Toops: Dorian Pod Other Clinician: Referring Lamoyne Hessel: Treating Beckett Hickmon/Extender:Robson, Dulcy Fanny, JULIE Weeks in Treatment:  3 Clinic Level of Care Assessment Items TOOL 4 Quantity Score []  - Use when only an EandM is performed on FOLLOW-UP visit 0 ASSESSMENTS - Nursing Assessment / Reassessment X - Reassessment of Co-morbidities (includes updates in patient status) 1 10 X - Reassessment of Adherence to Treatment Plan 1 5 ASSESSMENTS - Wound and Skin Assessment / Reassessment []  - Simple Wound Assessment / Reassessment - one wound 0 X - Complex Wound Assessment / Reassessment - multiple wounds 2 5 []  - Dermatologic / Skin Assessment (not related to wound area) 0 ASSESSMENTS - Focused Assessment []  - Circumferential Edema Measurements - multi extremities 0 []  - Nutritional Assessment / Counseling / Intervention 0 X - Lower Extremity Assessment (monofilament, tuning fork, pulses) 1 5 []  - Peripheral Arterial Disease Assessment (using hand held doppler) 0 ASSESSMENTS - Ostomy and/or Continence Assessment and Care []  - Incontinence Assessment and Management 0 []  - Ostomy Care Assessment and Management (repouching, etc.) 0 PROCESS - Coordination of Care X - Simple Patient / Family Education for ongoing care 1 15 []  - Complex (extensive) Patient / Family Education for ongoing care 0 X - Staff obtains Programmer, systems, Records, Test Results / Process Orders 1 10 X - Staff telephones HHA, Nursing Homes / Clarify orders / etc 1 10 []  - Routine Transfer to another Facility (non-emergent condition) 0 []  - Routine Hospital Admission (non-emergent condition) 0 []  - New Admissions / Biomedical engineer / Ordering NPWT, Apligraf, etc. 0 []  - Emergency Hospital Admission (emergent condition) 0 X - Simple Discharge Coordination 1 10 []  - Complex (extensive) Discharge Coordination 0 PROCESS - Special Needs []  - Pediatric / Minor Patient Management 0 []  - Isolation Patient Management 0 []  - Hearing / Language / Visual special needs 0 []  - Assessment of Community assistance (transportation, D/C planning, etc.) 0 []  -  Additional assistance / Altered mentation 0 []  - Support Surface(s) Assessment (bed, cushion, seat, etc.) 0 INTERVENTIONS - Wound Cleansing / Measurement []  - Simple Wound Cleansing - one wound 0  X - Complex Wound Cleansing - multiple wounds 2 5 X - Wound Imaging (photographs - any number of wounds) 1 5 []  - Wound Tracing (instead of photographs) 0 []  - Simple Wound Measurement - one wound 0 X - Complex Wound Measurement - multiple wounds 2 5 INTERVENTIONS - Wound Dressings X - Small Wound Dressing one or multiple wounds 2 10 []  - Medium Wound Dressing one or multiple wounds 0 []  - Large Wound Dressing one or multiple wounds 0 X - Application of Medications - topical 1 5 []  - Application of Medications - injection 0 INTERVENTIONS - Miscellaneous []  - External ear exam 0 []  - Specimen Collection (cultures, biopsies, blood, body fluids, etc.) 0 []  - Specimen(s) / Culture(s) sent or taken to Lab for analysis 0 []  - Patient Transfer (multiple staff / Civil Service fast streamer / Similar devices) 0 []  - Simple Staple / Suture removal (25 or less) 0 []  - Complex Staple / Suture removal (26 or more) 0 []  - Hypo / Hyperglycemic Management (close monitor of Blood Glucose) 0 []  - Ankle / Brachial Index (ABI) - do not check if billed separately 0 X - Vital Signs 1 5 Has the patient been seen at the hospital within the last three years: Yes Total Score: 130 Level Of Care: New/Established - Level 4 Electronic Signature(s) Signed: 04/05/2019 5:31:53 PM By: Baruch Gouty RN, BSN Entered By: Baruch Gouty on 04/05/2019 15:58:06 -------------------------------------------------------------------------------- Encounter Discharge Information Details Patient Name: Date of Service: Jamie Brookes 04/05/2019 3:30 PM Medical Record [id number] Patient Account Number: [id number] Date of Birth/Sex: Treating RN: [birth year] (64 y.o. Marvis Repress Primary Care Payten Hobin: Dorian Pod Other  Clinician: Referring Donye Dauenhauer: Treating Miku Udall/Extender:Robson, Dulcy Fanny, Achille Rich in Treatment: 3 Encounter Discharge Information Items Discharge Condition: Stable Ambulatory Status: Walker Discharge Destination: Home Transportation: Other Accompanied By: self Schedule Follow-up Appointment: Yes Clinical Summary of Care: Patient Declined Electronic Signature(s) Signed: 04/05/2019 5:06:29 PM By: Kela Millin Entered By: Kela Millin on 04/05/2019 16:15:00 -------------------------------------------------------------------------------- Lower Extremity Assessment Details Patient Name: Date of Service: JARL, SELLITTO 04/05/2019 3:30 PM Medical Record [id number] Patient Account Number: [id number] Date of Birth/Sex: Treating RN: 1955-11-20 (64 y.o. Marvis Repress Primary Care Khrystal Jeanmarie: Dorian Pod Other Clinician: Referring Dareth Andrew: Treating Jayveion Stalling/Extender:Robson, Dulcy Fanny, JULIE Weeks in Treatment: 3 Edema Assessment Assessed: [Left: No] [Right: No] Edema: [Left: Ye] [Right: s] Calf Left: Right: Point of Measurement: 31 cm From Medial Instep 34 cm cm Ankle Left: Right: Point of Measurement: 11 cm From Medial Instep 23.5 cm cm Vascular Assessment Pulses: Dorsalis Pedis Palpable: [Left:No] Electronic Signature(s) Signed: 04/05/2019 5:06:29 PM By: Kela Millin Entered By: Kela Millin on 04/05/2019 15:34:33 -------------------------------------------------------------------------------- Multi Wound Chart Details Patient Name: Date of Service: Jamie Brookes 04/05/2019 3:30 PM Medical Record [id number] Patient Account Number: [id number] Date of Birth/Sex: Treating RN: 1955-05-08 (64 y.o. M) Primary Care Hai Grabe: Dorian Pod Other Clinician: Referring Copper Basnett: Treating Mycala Warshawsky/Extender:Robson, Dulcy Fanny, JULIE Weeks in Treatment: 3 Vital Signs Height(in): 68 Capillary Blood  94 Glucose(mg/dl): Weight(lbs): 160 Pulse(bpm): 39 Body Mass Index(BMI): 24 Blood Pressure(mmHg): 132/64 Temperature(F): 99.1 Respiratory 18 Rate(breaths/min): Photos: [1:No Photos] [2:No Photos] [N/A:N/A] Wound Location: [1:Left Foot - Dorsal] [2:Left Toe Fourth - Dorsal] [N/A:N/A] Wounding Event: [1:Gradually Appeared] [2:Gradually Appeared] [N/A:N/A] Primary Etiology: [1:Diabetic Wound/Ulcer of the Diabetic Wound/Ulcer of the N/A Lower Extremity] [2:Lower Extremity] Comorbid History: [1:Hypertension, Peripheral Hypertension, Peripheral N/A Arterial Disease, Peripheral Arterial Disease, Peripheral Venous Disease, Type II Venous Disease, Type II Diabetes, Osteoarthritis] [2:Diabetes, Osteoarthritis] Date Acquired: [1:11/08/2018] [2:11/08/2018] [  N/A:N/A] Weeks of Treatment: [1:3] [2:3] [N/A:N/A] Wound Status: [1:Open] [2:Open] [N/A:N/A] Measurements L x W x D 2.5x1.3x1.1 [2:0.3x0.5x0.1] [N/A:N/A] (cm) Area (cm) : [1:2.553] [2:0.118] [N/A:N/A] Volume (cm) : [1:2.808] [2:0.012] [N/A:N/A] % Reduction in Area: [1:-116.70%] [2:-87.30%] [N/A:N/A] % Reduction in Volume: -116.70% [2:7.70%] [N/A:N/A] Classification: [1:Grade 2] [2:Unable to visualize wound N/A bed] Exudate Amount: [1:Medium] [2:Small] [N/A:N/A] Exudate Type: [1:Purulent] [2:Purulent] [N/A:N/A] Exudate Color: [1:yellow, brown, green] [2:yellow, brown, green] [N/A:N/A] Foul Odor After Cleansing:Yes [2:Yes] [N/A:N/A] Odor Anticipated Due to No [2:No] [N/A:N/A] Product Use: Wound Margin: [1:Well defined, not attached] [2:Flat and Intact] [N/A:N/A] Granulation Amount: [1:None Present (0%)] [2:None Present (0%)] [N/A:N/A] Necrotic Amount: [1:Large (67-100%)] [2:Large (67-100%)] [N/A:N/A] Necrotic Tissue: [1:Eschar, Adherent Slough] [2:Adherent Slough] [N/A:N/A] Exposed Structures: [1:Fat Layer (Subcutaneous Tissue) Exposed: Yes Tendon: Yes Muscle: Yes Fascia: No Joint: No Bone: No None] [2:Fat Layer (Subcutaneous Tissue)  Exposed: Yes Fascia: No Tendon: No Muscle: No Joint: No Bone: No None] [N/A:N/A N/A] Treatment Notes Wound #1 (Left, Dorsal Foot) 1. Cleanse With Wound Cleanser 3. Primary Dressing Applied Collegen AG Hydrogel or K-Y Jelly 4. Secondary Dressing Dry Gauze Roll Gauze 5. Secured With Tape Notes netting Wound #2 (Left, Dorsal Toe Fourth) 1. Cleanse With Wound Cleanser 3. Primary Dressing Applied Collegen AG Hydrogel or K-Y Jelly 4. Secondary Dressing Dry Gauze Roll Gauze 5. Secured With Tape Notes Horticulturist, commercial) Signed: 04/05/2019 5:31:36 PM By: Linton Ham MD Entered By: Linton Ham on 04/05/2019 16:21:31 -------------------------------------------------------------------------------- Multi-Disciplinary Care Plan Details Patient Name: Date of Service: Jamie Brookes 04/05/2019 3:30 PM Medical Record [id number] Patient Account Number: [id number] Date of Birth/Sex: Treating RN: [birth year] (64 y.o. Ernestene Mention Primary Care Laderrick Wilk: Other Clinician: Dorian Pod Referring Taariq Leitz: Treating Ashauna Bertholf/Extender:Robson, Dulcy Fanny, JULIE Weeks in Treatment: 3 Active Inactive Abuse / Safety / Falls / Self Care Management Nursing Diagnoses: Potential for falls Goals: Patient will not experience any injury related to falls Date Initiated: 03/12/2019 Date Inactivated: 04/05/2019 Target Resolution Date: 04/09/2019 Goal Status: Met Patient/caregiver will verbalize/demonstrate measures taken to prevent injury and/or falls Date Initiated: 03/12/2019 Target Resolution Date: 05/03/2019 Goal Status: Active Interventions: Assess fall risk on admission and as needed Assess impairment of mobility on admission and as needed per policy Notes: Nutrition Nursing Diagnoses: Impaired glucose control: actual or potential Potential for alteratiion in Nutrition/Potential for imbalanced nutrition Goals: Patient/caregiver will maintain  therapeutic glucose control Date Initiated: 03/12/2019 Target Resolution Date: 05/03/2019 Goal Status: Active Interventions: Assess HgA1c results as ordered upon admission and as needed Assess patient nutrition upon admission and as needed per policy Provide education on elevated blood sugars and impact on wound healing Treatment Activities: Patient referred to Primary Care Physician for further nutritional evaluation : 03/12/2019 Notes: Wound/Skin Impairment Nursing Diagnoses: Impaired tissue integrity Knowledge deficit related to smoking impact on wound healing Knowledge deficit related to ulceration/compromised skin integrity Goals: Patient will demonstrate a reduced rate of smoking or cessation of smoking Date Initiated: 03/12/2019 Target Resolution Date: 05/03/2019 Goal Status: Active Patient/caregiver will verbalize understanding of skin care regimen Date Initiated: 03/12/2019 Target Resolution Date: 05/03/2019 Goal Status: Active Ulcer/skin breakdown will have a volume reduction of 30% by week 4 Date Initiated: 03/12/2019 Date Inactivated: 04/05/2019 Target Resolution Date: 04/09/2019 Unmet Reason: severe PAD, Goal Status: Unmet unable to revascularize Interventions: Assess patient/caregiver ability to obtain necessary supplies Assess patient/caregiver ability to perform ulcer/skin care regimen upon admission and as needed Assess ulceration(s) every visit Provide education on smoking Provide education on ulcer and skin care Treatment Activities: Skin care  regimen initiated : 03/12/2019 Topical wound management initiated : 03/12/2019 Notes: Electronic Signature(s) Signed: 04/05/2019 5:31:53 PM By: Baruch Gouty RN, BSN Entered By: Baruch Gouty on 04/05/2019 15:56:37 -------------------------------------------------------------------------------- Pain Assessment Details Patient Name: Date of Service: Jamie Brookes 04/05/2019 3:30 PM Medical Record [id number] Patient  Account Number: [id number] Date of Birth/Sex: Treating RN: 1955-10-28 (65 y.o. Marvis Repress Primary Care Coltrane Tugwell: Dorian Pod Other Clinician: Referring Carsyn Boster: Treating Bracken Moffa/Extender:Robson, Dulcy Fanny, JULIE Weeks in Treatment: 3 Active Problems Location of Pain Severity and Description of Pain Patient Has Paino Yes Site Locations Pain Location: Pain in Ulcers With Dressing Change: Yes Duration of the Pain. Constant / Intermittento Constant Rate the pain. Current Pain Level: 5 Worst Pain Level: 9 Least Pain Level: 5 Tolerable Pain Level: 5 Character of Pain Describe the Pain: Burning, Shooting, Stabbing Pain Management and Medication Current Pain Management: How does your wound impact your activities of daily livingo Sleep: Yes Appetite: Yes Electronic Signature(s) Signed: 04/05/2019 5:06:29 PM By: Kela Millin Entered By: Kela Millin on 04/05/2019 15:32:48 -------------------------------------------------------------------------------- Patient/Caregiver Education Details Patient Name: Date of Service: Jamie Brookes 3/29/2021andnbsp3:30 PM Medical Record [id number] Patient Account Number: [id number] Date of Birth/Gender: Treating RN: 1955-11-13 (63 y.o. Ernestene Mention Primary Care Physician: Dorian Pod Other Clinician: Referring Physician: Treating Physician/Extender:Robson, Dulcy Fanny, Achille Rich in Treatment: 3 Education Assessment Education Provided To: Patient Education Topics Provided Smoking and Wound Healing: Methods: Explain/Verbal Responses: Reinforcements needed, State content correctly Tissue Oxygenation: Methods: Explain/Verbal Responses: Reinforcements needed, State content correctly Wound/Skin Impairment: Methods: Explain/Verbal Responses: Reinforcements needed, State content correctly Electronic Signature(s) Signed: 04/05/2019 5:31:53 PM By: Baruch Gouty RN, BSN Entered By:  Baruch Gouty on 04/05/2019 15:57:11 -------------------------------------------------------------------------------- Wound Assessment Details Patient Name: Date of Service: Jamie Brookes 04/05/2019 3:30 PM Medical Record [id number] Patient Account Number: [id number] Date of Birth/Sex: Treating RN: [birth year] (63 y.o. Marvis Repress Primary Care Christelle Igoe: Dorian Pod Other Clinician: Referring Jamari Moten: Treating Jerod Mcquain/Extender:Robson, Dulcy Fanny, JULIE Weeks in Treatment: 3 Wound Status Wound Number: 1 Primary Diabetic Wound/Ulcer of the Lower Extremity Etiology: Wound Location: Left Foot - Dorsal Wound Open Wounding Event: Gradually Appeared Status: Date Acquired: 11/08/2018 Comorbid Hypertension, Peripheral Arterial Disease, Weeks Of Treatment: 3 History: Peripheral Venous Disease, Type II Diabetes, Clustered Wound: No Osteoarthritis Wound Measurements Length: (cm) 2.5 Width: (cm) 1.3 Depth: (cm) 1.1 Area: (cm) 2.553 Volume: (cm) 2.808 Wound Description Classification: Grade 2 Wound Margin: Well defined, not attached Exudate Amount: Medium Exudate Type: Purulent Exudate Color: yellow, brown, green Wound Bed Granulation Amount: None Present (0%) Necrotic Amount: Large (67-100%) Necrotic Quality: Eschar, Adherent Slough Treatment Notes Wound #1 (Left, Dorsal Foot) 1. Cleanse With Wound Cleanser 3. Primary Dressing Applied Collegen AG Hydrogel or K-Y Jelly 4. Secondary Dressing Dry Gauze Roll Gauze 5. Secured With Tape After Cleansing: Yes duct Use: No rino Yes Exposed Structure osed: No (Subcutaneous Tissue) Exposed: Yes osed: Yes osed: Yes sis of Muscle: No sed: No ed: No % Reduction in Area: -116.7% % Reduction in Volume: -116.7% Epithelialization: None Tunneling: No Undermining: No Foul Odor Due to Pro Slough/Fib Fascia Exp Fat Layer Tendon Exp Muscle Exp Necro Joint Expo Bone  Expos Notes netting Electronic Signature(s) Signed: 04/05/2019 5:06:29 PM By: Kela Millin Entered By: Kela Millin on 04/05/2019 15:40:35 -------------------------------------------------------------------------------- Wound Assessment Details Patient Name: Date of Service: Jamie Brookes 04/05/2019 3:30 PM Medical Record [id number] Patient Account Number: [id number] Date of Birth/Sex: Treating RN: 06-04-1955 (64 y.o. Marvis Repress Primary Care Paysley Poplar: Dorian Pod Other Clinician: Referring  Kamile Fassler: Treating Leiani Enright/Extender:Robson, Dulcy Fanny, JULIE Weeks in Treatment: 3 Wound Status Wound Number: 2 Primary Diabetic Wound/Ulcer of the Lower Extremity Etiology: Wound Location: Left Toe Fourth - Dorsal Wound Open Wounding Event: Gradually Appeared Status: Date Acquired: 11/08/2018 Comorbid Hypertension, Peripheral Arterial Disease, Weeks Of Treatment: 3 History: Peripheral Venous Disease, Type II Diabetes, Clustered Wound: No Osteoarthritis Wound Measurements Length: (cm) 0.3 % Reduction Width: (cm) 0.5 % Reduction Depth: (cm) 0.1 Epitheliali Area: (cm) 0.118 Tunneling: Volume: (cm) 0.012 Underminin Wound Description Classification: Unable to visualize wound bed Wound Margin: Flat and Intact Exudate Amount: Small Exudate Type: Purulent Exudate Color: yellow, brown, green Wound Bed Granulation Amount: None Present (0%) Necrotic Amount: Large (67-100%) Necrotic Quality: Adherent Slough Foul Odor After Cleansing: Yes Due to Product Use: No Slough/Fibrino Yes Exposed Structure Fascia Exposed: No Fat Layer (Subcutaneous Tissue) Exposed: Yes Tendon Exposed: No Muscle Exposed: No Joint Exposed: No Bone Exposed: No in Area: -87.3% in Volume: 7.7% zation: None No g: No Treatment Notes Wound #2 (Left, Dorsal Toe Fourth) 1. Cleanse With Wound Cleanser 3. Primary Dressing Applied Collegen AG Hydrogel or K-Y Jelly 4.  Secondary Dressing Dry Gauze Roll Gauze 5. Secured With Tape Notes Horticulturist, commercial) Signed: 04/05/2019 5:06:29 PM By: Kela Millin Entered By: Kela Millin on 04/05/2019 15:41:07 -------------------------------------------------------------------------------- Vitals Details Patient Name: Date of Service: Jamie Brookes 04/05/2019 3:30 PM Medical Record [id number] Patient Account Number: [id number] Date of Birth/Sex: Treating RN: May 12, 1955 (64 y.o. Marvis Repress Primary Care Arleen Bar: Dorian Pod Other Clinician: Referring Marget Outten: Treating Dyllin Gulley/Extender:Robson, Dulcy Fanny, JULIE Weeks in Treatment: 3 Vital Signs Time Taken: 15:30 Temperature (F): 99.1 Height (in): 68 Pulse (bpm): 84 Weight (lbs): 160 Respiratory Rate (breaths/min): 18 Body Mass Index (BMI): 24.3 Blood Pressure (mmHg): 132/64 Capillary Blood Glucose (mg/dl): 94 Reference Range: 80 - 120 mg / dl Notes patient stated CBG was 94 Electronic Signature(s) Signed: 04/05/2019 5:06:29 PM By: Kela Millin Entered By: Kela Millin on 04/05/2019 15:32:19

## 2019-04-05 NOTE — Progress Notes (Signed)
DAVID, BALDI ([id number]) Visit Report for 04/05/2019 HPI Details Patient Name: Date of Service: Billy Torres, Billy Torres 04/05/2019 3:30 PM Medical Record [id number] Patient Account Number: [id number] Date of Birth/Sex: Treating RN: February 15, 1955 (64 y.o. M) Primary Care Provider: Dorian Pod Other Clinician: Referring Provider: Treating Provider/Extender:Mistie Adney, Dulcy Fanny, JULIE Weeks in Treatment: 3 History of Present Illness HPI Description: ADMISSION 03/12/2019 This is a 64 year old man with type 2 diabetes on oral agents. He has had a wound on the left dorsal foot just proximal to the base of the third and fourth toes since at least November he underwent a angiogram on 12/11/2018 by vein and vascular. He underwent stenting of the left SFA. Noted to have single-vessel runoff via the peroneal artery which is included in the distal leg. I do not think they was felt that he had an additional procedure option. He was recently seen by vascular on 02/12/2019 noted to have a 1.5 x 1 cm ulcer and suggested wet-to-dry twice daily. He actually has another ulcer on the dorsal aspect of the left fourth toe at the base. An MRI of the foot done on 12/07/2018 did not show evidence of osteomyelitis The patient had repeat ABIs on 01/27/2019. On the right he was noncompressible with a TBI of 0.4 monophasic waveforms. On the left ABI of 1.22 with a TBI of 0.55. This is a marked improvement from previous TBI at which time it was 0.43 and before that 0.16 I spoke with Dr. Jimmye Norman who is the patient's primary physician. They change the dressing to Santyl earlier this week. That is appropriate. The patient ranks his pain at a 4 out of 10. We are asked about hyperbaric oxygen therapy. Past medical history; type 2 diabetes, prostate CA, history of stroke, chronic venous disease, he is a smoker. 03/19/19; no major change using santyl to both wound areas on the dorsal foot 3/29; the wound is  deteriorated quite a bit on the dorsal foot. There is no exposed tendon which I think is the extensor tendon of the left second toe. There is an odor here. The patient was taken back for angiography by Dr. Doren Custard. This was done on 3/26. On the left side the common femoral, deep femoral are patent. The superficial femoral artery is patent including the previously angioplastied segment the popliteal artery was patent patent however there was single-vessel runoff via the peroneal artery the anterior tibial and posterior tibial arteries are occluded. On the right side the superficial femoral artery and Popliteal artery are patent single-vessel runoff via the peroneal artery. Short segment occlusion of the superficial femoral artery to the mid thigh The patient is complaining of increasing pain Electronic Signature(s) Signed: 04/05/2019 5:31:36 PM By: Linton Ham MD Entered By: Linton Ham on 04/05/2019 16:24:20 -------------------------------------------------------------------------------- Physical Exam Details Patient Name: Date of Service: Billy Torres 04/05/2019 3:30 PM Medical Record [id number] Patient Account Number: [id number] Date of Birth/Sex: Treating RN: 1955-08-16 (64 y.o. M) Primary Care Provider: Dorian Pod Other Clinician: Referring Provider: Treating Provider/Extender:Ranveer Wahlstrom, Dulcy Fanny, JULIE Weeks in Treatment: 3 Constitutional Sitting or standing Blood Pressure is within target range for patient.. Pulse regular and within target range for patient.Marland Kitchen Respirations regular, non-labored and within target range.. Temperature is normal and within the target range for the patient.Marland Kitchen Appears in no distress. Cardiovascular Pedal pulses are absent on the left.. Notes Wound exam; the patient has a punched-out area over the dorsal base of the left third toe. This is deteriorated quite a bit since I last saw  this 2 weeks ago. The surface of the wound is  completely necrotic. There is an odor. Exposed extensor tendon of the second toe. Small superficial area on the left fourth toe Electronic Signature(s) Signed: 04/05/2019 5:31:36 PM By: Linton Ham MD Entered By: Linton Ham on 04/05/2019 16:27:14 -------------------------------------------------------------------------------- Physician Orders Details Patient Name: Date of Service: Billy Torres 04/05/2019 3:30 PM Medical Record [id number] Patient Account Number: [id number] Date of Birth/Sex: Treating RN: 03/19/1955 (64 y.o. Ernestene Mention Primary Care Provider: Dorian Pod Other Clinician: Referring Provider: Treating Provider/Extender:Wandalee Klang, Dulcy Fanny, Achille Rich in Treatment: 3 Verbal / Phone Orders: No Diagnosis Coding ICD-10 Coding Code Description E11.621 Type 2 diabetes mellitus with foot ulcer E11.51 Type 2 diabetes mellitus with diabetic peripheral angiopathy without gangrene L97.522 Non-pressure chronic ulcer of other part of left foot with fat layer exposed Follow-up Appointments Return Appointment in 2 weeks. Dressing Change Frequency Wound #1 Left,Dorsal Foot Change dressing every day. Wound #2 Left,Dorsal Toe Fourth Change dressing every day. Wound Cleansing Wound #1 Left,Dorsal Foot Clean wound with Wound Cleanser Wound #2 Left,Dorsal Toe Fourth Clean wound with Wound Cleanser Primary Wound Dressing Wound #1 Left,Dorsal Foot Silver Collagen - moisten with hydrogel Wound #2 Left,Dorsal Toe Fourth Silver Collagen - moisten with hydrogel Secondary Dressing Wound #1 Left,Dorsal Foot Kerlix/Rolled Gauze Dry Gauze Wound #2 Left,Dorsal Toe Fourth Kerlix/Rolled Gauze Dry [place] skilled nursing for wound care. - [facility] Electronic Signature(s) Signed: 04/05/2019 5:31:36 PM By: Linton Ham MD Signed: 04/05/2019 5:31:53 PM By: Baruch Gouty RN, BSN Entered By: Baruch Gouty on 04/05/2019  16:05:04 -------------------------------------------------------------------------------- Problem List Details Patient Name: Date of Service: Billy Torres, Billy Torres 04/05/2019 3:30 PM Medical Record [id number] Patient Account Number: [id number] Date of Birth/Sex: Treating RN: 07-May-1955 (64 y.o. Ernestene Mention Primary Care Provider: Dorian Pod Other Clinician: Referring Provider: Treating Provider/Extender:Nikolos Billig, Dulcy Fanny, Achille Rich in Treatment: 3 Active Problems ICD-10 Evaluated Encounter Code Description Active Date Today Diagnosis E11.621 Type 2 diabetes mellitus with foot ulcer 03/12/2019 No Yes E11.51 Type 2 diabetes mellitus with diabetic peripheral 03/12/2019 No Yes angiopathy without gangrene L97.522 Non-pressure chronic ulcer of other part of left foot 03/12/2019 No Yes with fat layer exposed Inactive Problems Resolved Problems Electronic Signature(s) Signed: 04/05/2019 5:31:36 PM By: Linton Ham MD Entered By: Linton Ham on 04/05/2019 16:21:05 -------------------------------------------------------------------------------- Progress Note Details Patient Name: Date of Service: Billy Torres 04/05/2019 3:30 PM Medical Record [id number] Patient Account Number: [id number] Date of Birth/Sex: Treating RN: 01/27/1955 (64 y.o. M) Primary Care Provider: Dorian Pod Other Clinician: Referring Provider: Treating Provider/Extender:Carroll Ranney, Dulcy Fanny, JULIE Weeks in Treatment: 3 Subjective History of Present Illness (HPI) ADMISSION 03/12/2019 This is a 64 year old man with type 2 diabetes on oral agents. He has had a wound on the left dorsal foot just proximal to the base of the third and fourth toes since at least November he underwent a angiogram on 12/11/2018 by vein and vascular. He underwent stenting of the left SFA. Noted to have single-vessel runoff via the peroneal artery which is included in the distal leg. I do not think  they was felt that he had an additional procedure option. He was recently seen by vascular on 02/12/2019 noted to have a 1.5 x 1 cm ulcer and suggested wet-to-dry twice daily. He actually has another ulcer on the dorsal aspect of the left fourth toe at the base. An MRI of the foot done on 12/07/2018 did not show evidence of osteomyelitis The patient had repeat ABIs on  01/27/2019. On the right he was noncompressible with a TBI of 0.4 monophasic waveforms. On the left ABI of 1.22 with a TBI of 0.55. This is a marked improvement from previous TBI at which time it was 0.43 and before that 0.16 I spoke with Dr. Jimmye Norman who is the patient's primary physician. They change the dressing to Santyl earlier this week. That is appropriate. The patient ranks his pain at a 4 out of 10. We are asked about hyperbaric oxygen therapy. Past medical history; type 2 diabetes, prostate CA, history of stroke, chronic venous disease, he is a smoker. 03/19/19; no major change using santyl to both wound areas on the dorsal foot 3/29; the wound is deteriorated quite a bit on the dorsal foot. There is no exposed tendon which I think is the extensor tendon of the left second toe. There is an odor here. The patient was taken back for angiography by Dr. Doren Custard. This was done on 3/26. On the left side the common femoral, deep femoral are patent. The superficial femoral artery is patent including the previously angioplastied segment the popliteal artery was patent patent however there was single-vessel runoff via the peroneal artery the anterior tibial and posterior tibial arteries are occluded. On the right side the superficial femoral artery and Popliteal artery are patent single-vessel runoff via the peroneal artery. Short segment occlusion of the superficial femoral artery to the mid thigh The patient is complaining of increasing pain Objective Constitutional Sitting or standing Blood Pressure is within target range for  patient.. Pulse regular and within target range for patient.Marland Kitchen Respirations regular, non-labored and within target range.. Temperature is normal and within the target range for the patient.Marland Kitchen Appears in no distress. Vitals Time Taken: 3:30 PM, Height: 68 in, Weight: 160 lbs, BMI: 24.3, Temperature: 99.1 F, Pulse: 84 bpm, Respiratory Rate: 18 breaths/min, Blood Pressure: 132/64 mmHg, Capillary Blood Glucose: 94 mg/dl. General Notes: patient stated CBG was 94 Cardiovascular Pedal pulses are absent on the left.. General Notes: Wound exam; the patient has a punched-out area over the dorsal base of the left third toe. This is deteriorated quite a bit since I last saw this 2 weeks ago. The surface of the wound is completely necrotic. There is an odor. Exposed extensor tendon of the second toe. Small superficial area on the left fourth toe Integumentary (Hair, Skin) Wound #1 status is Open. Original cause of wound was Gradually Appeared. The wound is located on the Left,Dorsal Foot. The wound measures 2.5cm length x 1.3cm width x 1.1cm depth; 2.553cm^2 area and 2.808cm^3 volume. There is muscle, tendon, and Fat Layer (Subcutaneous Tissue) Exposed exposed. There is no tunneling or undermining noted. There is a medium amount of purulent drainage noted. Foul odor after cleansing was noted. The wound margin is well defined and not attached to the wound base. There is no granulation within the wound bed. There is a large (67-100%) amount of necrotic tissue within the wound bed including Eschar and Adherent Slough. Wound #2 status is Open. Original cause of wound was Gradually Appeared. The wound is located on the Left,Dorsal Toe Fourth. The wound measures 0.3cm length x 0.5cm width x 0.1cm depth; 0.118cm^2 area and 0.012cm^3 volume. There is Fat Layer (Subcutaneous Tissue) Exposed exposed. There is no tunneling or undermining noted. There is a small amount of purulent drainage noted. Foul odor after  cleansing was noted. The wound margin is flat and intact. There is no granulation within the wound bed. There is a large (67-100%) amount  of necrotic tissue within the wound bed including Adherent Slough. Assessment Active Problems ICD-10 Type 2 diabetes mellitus with foot ulcer Type 2 diabetes mellitus with diabetic peripheral angiopathy without gangrene Non-pressure chronic ulcer of other part of left foot with fat layer exposed Plan Follow-up Appointments: Return Appointment in 2 weeks. Dressing Change Frequency: Wound #1 Left,Dorsal Foot: Change dressing every day. Wound #2 Left,Dorsal Toe Fourth: Change dressing every day. Wound Cleansing: Wound #1 Left,Dorsal Foot: Clean wound with Wound Cleanser Wound #2 Left,Dorsal Toe Fourth: Clean wound with Wound Cleanser Primary Wound Dressing: Wound #1 Left,Dorsal Foot: Silver Collagen - moisten with hydrogel Wound #2 Left,Dorsal Toe Fourth: Silver Collagen - moisten with hydrogel Secondary Dressing: Wound #1 Left,Dorsal Foot: Kerlix/Rolled Gauze Dry Gauze Wound #2 Left,Dorsal Toe Fourth: Kerlix/Rolled Gauze Dry Gauze Home Health: [place] skilled nursing for wound care. - [facility] 1. I change the primary dressing to silver collagen maintenance for exposed tendon and likely some degree of infection 2. Patient ranks his pain at 5-6 out of 10 at rest. I spent some time talking him today. I think he is going to need a below-knee amputation in some point and I explained to him the thought process here either when the pain becomes unbearable or if there is any sign of infection. I am not sure he is not close to these endpoints. 3. I will communicate with the patient's primary doctor Electronic Signature(s) Signed: 04/05/2019 5:31:36 PM By: Linton Ham MD Entered By: Linton Ham on 04/05/2019 16:28:52 -------------------------------------------------------------------------------- SuperBill Details Patient  Name: Date of Service: Billy Torres 04/05/2019 Medical Record [id number] Patient Account Number: [id number] Date of Birth/Sex: Treating RN: 1955-06-07 (63 y.o. Ulyses Amor, Vaughan Basta Primary Care Provider: Dorian Pod Other Clinician: Referring Provider: Treating Provider/Extender:Ellenora Talton, Dulcy Fanny, Achille Rich in Treatment: 3 Diagnosis Coding ICD-10 Codes Code Description E11.621 Type 2 diabetes mellitus with foot ulcer E11.51 Type 2 diabetes mellitus with diabetic peripheral angiopathy without gangrene L97.522 Non-pressure chronic ulcer of other part of left foot with fat layer exposed Facility Procedures CPT4 Code: [id number] Description: 99214 - WOUND CARE VISIT-LEV 4 EST PT Modifier: Quantity: 1 Physician Procedures CPT4 Code Description: [id number] 99213 - WC PHYS LEVEL 3 - EST PT ICD-10 Diagnosis Description E11.621 Type 2 diabetes mellitus with foot ulcer E11.51 Type 2 diabetes mellitus with diabetic peripheral ang L97.522 Non-pressure chronic ulcer of other part of  left foot Modifier: iopathy without with fat layer Quantity: 1 gangrene exposed Electronic Signature(s) Signed: 04/05/2019 5:31:36 PM By: Linton Ham MD Entered By: Linton Ham on 04/05/2019 16:29:18

## 2019-04-19 ENCOUNTER — Encounter (HOSPITAL_BASED_OUTPATIENT_CLINIC_OR_DEPARTMENT_OTHER): Payer: Medicare (Managed Care) | Attending: Internal Medicine | Admitting: Internal Medicine

## 2019-04-22 ENCOUNTER — Ambulatory Visit (INDEPENDENT_AMBULATORY_CARE_PROVIDER_SITE_OTHER): Payer: Medicare (Managed Care) | Admitting: Orthopedic Surgery

## 2019-04-22 ENCOUNTER — Encounter: Payer: Self-pay | Admitting: Orthopedic Surgery

## 2019-04-22 ENCOUNTER — Other Ambulatory Visit: Payer: Self-pay

## 2019-04-22 DIAGNOSIS — I96 Gangrene, not elsewhere classified: Secondary | ICD-10-CM

## 2019-04-22 NOTE — Progress Notes (Signed)
Office Visit Note   Patient: Billy Torres           Date of Birth: [birth year]           MRN: [id number] Visit Date: 04/22/2019              Requested by: Angelica Pou, MD [place],  [place] [address] PCP: Angelica Pou, MD  Chief Complaint  Patient presents with  . Left Foot - Pain      HPI: Patient is a 64 year old gentleman who is seen for initial evaluation for [place] foul-smelling necrotic gangrenous ulcer dorsal aspect left foot.  Patient has had arteriographic studies with vascular vein surgery in December 2020 and had a stent placed in the left SFA.  Patient's ankle-brachial indices in January 2021 were 1.22 with a toe brachial indices of 1.55.  Patient has had an MRI scan of the left foot in November 2020 which did not show any bony infection.  He states that he currently has uncontrollable pain in the left foot from the ischemic changes.  Assessment & Plan: Visit Diagnoses:  1. Gangrene of left foot ([place])     Plan: Discussed with the patient that we could attempt a partial foot salvage intervention with a midfoot amputation.  Patient states that with the global pain in his foot he would prefer proceeding with a transtibial amputation with a better chance of healing and functional outcome with a prosthesis.  I agree with the patient that this is his best option.  We will [facility] to proceed with a left transtibial amputation.  Patient will need either inpatient or outpatient rehabilitation postoperatively.  Follow-Up Instructions: Return in about 1 week (around 04/29/2019).   Ortho Exam  Patient is alert, oriented, no adenopathy, well-dressed, normal affect, normal respiratory effort. Examination patient has a foul-smelling necrotic ulcer over the dorsal aspect of the left foot.  There is necrotic tissue odor and drainage with exposed tendons and black necrotic changes down to bone.  Touching the wound is  extremely painful.  Patient does not have a palpable dorsalis pedis pulse with Doppler was used and he has a dampened monophasic dorsalis pedis and posterior tibial pulse.  Imaging: No results found. No images are attached to the encounter.  Labs: Lab Results  Component Value Date   HGBA1C 6.5 08/14/2015   HGBA1C 7.2 (H) 05/12/2015   HGBA1C 10.2 (H) 02/28/2014   REPTSTATUS 05/24/2015 FINAL 05/23/2015   CULT MULTIPLE SPECIES PRESENT, SUGGEST RECOLLECTION (A) 05/23/2015     Lab Results  Component Value Date   ALBUMIN 3.1 (L) 08/16/2016   ALBUMIN 3.2 (L) 08/15/2016   ALBUMIN 4.2 08/14/2015    No results found for: MG No results found for: VD25OH  No results found for: PREALBUMIN CBC EXTENDED Latest Ref Rng & Units 04/02/2019 12/11/2018 08/16/2016  WBC 4.0 - 10.5 K/uL - - 7.9  RBC 4.22 - 5.81 MIL/uL - - 3.47(L)  HGB 13.0 - 17.0 g/dL 14.3 13.6 9.6(L)  HCT 39.0 - 52.0 % 42.0 40.0 29.4(L)  PLT 150 - 400 K/uL - - 337  NEUTROABS 1,500 - 7,800 cells/uL - - -  LYMPHSABS 850 - 3,900 cells/uL - - -     There is no height or weight on file to calculate BMI.  Orders:  No orders of the defined types were placed in this encounter.  No orders of the defined types were placed in this encounter.  Procedures: No procedures performed  Clinical Data: No additional findings.  ROS:  All other systems negative, except as noted in the HPI. Review of Systems  Objective: Vital Signs: There were no vitals taken for this visit.  Specialty Comments:  No specialty comments available.  PMFS History: Patient Active Problem List   Diagnosis Date Noted  . Atherosclerotic PVD with ulceration ([place]) 12/11/2018  . Hydronephrosis due to obstruction of bladder   . Acute urinary retention 08/15/2016  . Malignant neoplasm of prostate ([place]) 02/26/2016  . Muscle spasticity   . Dementia ([place])   . Cerebral infarction due to unspecified mechanism   . Leukocytosis   . Acute thrombotic stroke  ([place]) 05/17/2015  . Diabetes mellitus due to underlying condition with complication, without long-term current use of insulin ([place])   . Gait disturbance, post-stroke   . Hemiparesis ([place])   . Cognitive deficit due to old cerebral infarction   . Benign essential HTN   . Diabetes mellitus type 2 in nonobese ([facility])   . Essential hypertension   . History of CVA (cerebrovascular accident) without residual deficits   . Ataxia, post-stroke   . Dysarthria, post-stroke   . Tobacco abuse   . HLD (hyperlipidemia)   . Acute CVA (cerebrovascular accident) ([place]) 05/15/2015  . Left-sided weakness 05/14/2015  . Right shoulder pain 03/03/2014  . History of CVA (cerebrovascular accident) 02/28/2014  . Smoking 06/29/2013  . CVA (cerebral infarction) 06/29/2013  . Dyslipidemia 06/29/2013  . TIA (transient ischemic attack) 06/27/2013  . Diabetes ([place]) 06/27/2013   Past Medical History:  Diagnosis Date  . Abnormal rate of speech    post stroke  . Anticoagulant long-term use    plavix  . Arthritis of left shoulder region   . At high risk for falls   . Chronic constipation   . Chronic kidney disease    chronic kidney disease   . Cognitive deficit due to old cerebral infarction   . Constipation   . DDD (degenerative disc disease), cervical   . Degenerative cervical spinal stenosis   . Dysphagia   . Frequency of urination   . Gait disturbance, post-stroke    uses cane  . History of ischemic left MCA stroke 06/27/2013   w/ evidence hemorrhagia, high grade stenosis bilateral proximal MCA and extensive small matter disease--- symptoms resolved  . History of ischemic stroke without residual deficits 05-14-2015---  residual left hemiparesis and cognitive deficits   small nonhemorrhegic infarct right corona radiata posterior limb and progressive right m2 stenosis  . Hyperlipidemia   . Hypertension   . Indwelling urethral catheter present   . Insomnia   . Internal hemorrhoids   . Left hemiparesis  ([facility])    RESIDUAL FROM STROKE--  WALKS W/ CANE  . Mild atherosclerosis of carotid artery, right    RICA 0-39% per duplex 06-28-2013  . Night sweats   . OA (osteoarthritis)    LEFT SHOULDER  . Painful bladder spasm   . Peripheral arterial disease ([place])   . Prostate cancer [facility]) urologist-  dr Gaynelle Arabian  oncologist-  dr [name]   dx 12/ 2017-- Stage T1c, Gleason 3+4,  PSA 10.4,  vol 51cc  . Sensorineural hearing loss, bilateral   . Type 2 diabetes mellitus treated with insulin ([place])   . Urge incontinence    wears depends  . Urinary retention   . Wears dentures     Family History  Problem Relation Age of Onset  . Hypertension Mother   .  Hypertension Father   . Cancer Brother        prostate    Past Surgical History:  Procedure Laterality Date  . ABDOMINAL AORTOGRAM W/LOWER EXTREMITY N/A 12/11/2018   Procedure: ABDOMINAL AORTOGRAM W/LOWER EXTREMITY;  Surgeon: Angelia Mould, MD;  Location: [place] [facility];  Service: Cardiovascular;  Laterality: N/A;  . ABDOMINAL AORTOGRAM W/LOWER EXTREMITY Bilateral 04/02/2019   Procedure: ABDOMINAL AORTOGRAM W/LOWER EXTREMITY;  Surgeon: Angelia Mould, MD;  Location: [facility];  Service: Cardiovascular;  Laterality: Bilateral;  . ANTERIOR CERVICAL DECOMP/DISCECTOMY FUSION  11/27/1999   C5 -- C6  . CIRCUMCISION  10/07/2001  . CYSTOSCOPY WITH INSERTION OF UROLIFT N/A 08/11/2017   Procedure: CYSTOSCOPY WITH INSERTION OF UROLIFT;  Surgeon: Cleon Gustin, MD;  Location: [facility];  Service: Urology;  Laterality: N/A;  . FOOT SURGERY  2005  . PERIPHERAL VASCULAR BALLOON ANGIOPLASTY  12/11/2018   Procedure: PERIPHERAL VASCULAR BALLOON ANGIOPLASTY;  Surgeon: Angelia Mould, MD;  Location: [facility];  Service: Cardiovascular;;  Lt. SFA  . PROSTATE BIOPSY  12/12/2015  . RADIOACTIVE SEED IMPLANT N/A 05/30/2016   Procedure: RADIOACTIVE SEED IMPLANT/BRACHYTHERAPY IMPLANT WITH SPACE OAR;  Surgeon: Carolan Clines, MD;  [facility];  Service: Urology;  Laterality: N/A;  . SHOULDER ARTHROSCOPY WITH SUBACROMIAL DECOMPRESSION Left 11/28/2006   Debridement/ Acromioplasty/  CA ligament relesase  . TRANSTHORACIC ECHOCARDIOGRAM  05/15/2015   mild LVH,  ef 65-70%, codominant mitral inflow-iindeterminate diastolic function/     Social History   Occupational History  . Not on file  Tobacco Use  . Smoking status: Current Some Day Smoker    Packs/day: 0.25    Years: 45.00    Pack years: 11.25    Types: Cigarettes  . Smokeless tobacco: Never Used  . Tobacco comment: continues to smoke approx. 1 to 2 cig's per month  Substance and Sexual Activity  . Alcohol use: No  . Drug use: No  . Sexual activity: Yes

## 2019-04-23 ENCOUNTER — Telehealth: Payer: Self-pay | Admitting: Orthopedic Surgery

## 2019-04-23 ENCOUNTER — Telehealth: Payer: Self-pay | Admitting: Radiology

## 2019-04-23 ENCOUNTER — Other Ambulatory Visit: Payer: Self-pay | Admitting: Physician Assistant

## 2019-04-23 NOTE — Telephone Encounter (Signed)
I spoke with Billy Torres and patient's doctor from [facility]. They agree with surgery plan for Wed 04/28/19. All info given.  It was asked that Dr. Sharol Given call the patient's son, [name] [phone], to discuss reason for surgery/findings at his appointment yesterday.  Dr. Sharol Given spoke with [name].  Questions answered.

## 2019-04-23 NOTE — Telephone Encounter (Signed)
Please return call. Dr. Dorian Pod, from [facility].  Wanted to speak with Dr. Jess Barters surgery scheduler. Wanted to confirm surgery and plan of care. States patient has open wound she's very concerned about.  Patient was seen in office yesterday with Dr. Sharol Given.  CB # [phone]

## 2019-04-27 ENCOUNTER — Other Ambulatory Visit (HOSPITAL_COMMUNITY)
Admission: RE | Admit: 2019-04-27 | Discharge: 2019-04-27 | Disposition: A | Discharge status: Home / Self Care | Payer: Medicare (Managed Care) | Source: Ambulatory Visit | Attending: Orthopedic Surgery | Admitting: Orthopedic Surgery

## 2019-04-27 LAB — SARS CORONAVIRUS 2 (TAT 6-24 HRS): SARS Coronavirus 2: NEGATIVE

## 2019-04-27 NOTE — Progress Notes (Signed)
Malachy Mood, Surgical Coordinator, instructed nurse to not have pt take Aspirin and Plavix on DOS.

## 2019-04-27 NOTE — Progress Notes (Signed)
Pre-op instructions faxed to [facility] C/O Dr. Dorian Pod as requested. Donne Anon, confirmed receipt of fax.

## 2019-04-27 NOTE — Pre-Procedure Instructions (Addendum)
Billy Torres  04/27/2019      Your procedure is scheduled on Wednesday, April 28, 2019  Report to [facility] Admitting at 8:30 A.M.  Call this number if you have problems the morning of surgery:  [phone]   Remember: Brush your teeth the morning of surgery with your regular toothpaste.   Do not eat after midnight.   You may drink clear liquids until 7:55A.M. the morning of surgery.  Clear liquids allowed are: Water, Juice (non-citric and without pulp), Carbonated beverages, Clear Tea, Black Coffee only and Gatorade ( low sugar).    Take these medicines the morning of surgery with A SIP OF WATER :   acetaminophen (TYLENOL)   alfuzosin (UROXATRAL)  amLODipine (NORVASC)  atorvastatin (LIPITOR) If needed: fexofenadine (ALLEGRA) for allergies If needed: oxyCODONE (OXY IR/ROXICODONE) for pain  Stop taking vitamins, fish oil and herbal medications. Do not take any NSAIDs ie: Ibuprofen,  Advil, Naproxen (Aleve), Motrin, BC and Goody Powder; stop now.    Follow the surgeon's instructions regarding Aspirin and clopidogrel (PLAVIX).  If no pre-op instructions were provided, you will need to call the surgeon's office for those instructions.    How to Manage Your Diabetes Before and After Surgery  Why is it important to control my blood sugar before and after surgery? . Improving blood sugar levels before and after surgery helps healing and can limit problems. . A way of improving blood sugar control is eating a healthy diet by: o  Eating less sugar and carbohydrates o  Increasing activity/exercise o  Talking with your doctor about reaching your blood sugar goals . High blood sugars (greater than 180 mg/dL) can raise your risk of infections and slow your recovery, so you will need to focus on controlling your diabetes during the weeks before surgery. . Make sure that the doctor who takes care of your diabetes knows about your planned surgery including the date and  location.  How do I manage my blood sugar before surgery? . Check your blood sugar at least 4 times a day, starting 2 days before surgery, to make sure that the level is not too high or low. o Check your blood sugar the morning of your surgery when you wake up and every 2 hours until you get to the Short Stay unit. . If your blood sugar is less than 70 mg/dL, you will need to treat for low blood sugar: o Treat a low blood sugar (less than 70 mg/dL) with  cup of clear juice (cranberry or apple), 4 glucose tablets, OR glucose gel. Recheck blood sugar in 15 minutes after treatment (to make sure it is greater than 70 mg/dL). If your blood sugar is not greater than 70 mg/dL on recheck, call [phone] o  for further instructions. . Report your blood sugar to the short stay nurse when you get to Short Stay.  . If you are admitted to the hospital after surgery: o Your blood sugar will be checked by the staff and you will probably be given insulin after surgery (instead of oral diabetes medicines) to make sure you have good blood sugar levels. o The goal for blood sugar control after surgery is 80-180 mg/dL.  WHAT DO I DO ABOUT MY DIABETES MEDICATION?   Marland Kitchen Do not take oral diabetes medicines (pills) the morning of surgery such as glipiZIDE (GLUCOTROL XL) and metFORMIN (GLUCOPHAGE)   Reviewed and Endorsed by [facility] Patient Education Committee, August 2015   Do  not wear jewelry.  Do not wear lotions, powders, or perfumes, or deodorant.  Do not shave 48 hours prior to surgery.  Men may shave face and neck.  Do not bring valuables to the hospital.  [facility] is not responsible for any belongings or valuables.  Contacts, dentures or bridgework may not be worn into surgery.  Leave your suitcase in the car.  After surgery it may be brought to your room.  For patients admitted to the hospital, discharge time will be determined by your treatment team.  Patients discharged the day of surgery  will not be allowed to drive home.   Special instructions: Wash with an  Antibacterial soap and wear clean clothes to the hospital  Please read over the following fact sheets that you were given.

## 2019-04-28 ENCOUNTER — Encounter (HOSPITAL_COMMUNITY): Payer: Medicare (Managed Care)

## 2019-04-28 ENCOUNTER — Ambulatory Visit: Payer: Medicare (Managed Care) | Admitting: Vascular Surgery

## 2019-04-28 ENCOUNTER — Other Ambulatory Visit: Payer: Self-pay

## 2019-04-28 ENCOUNTER — Encounter (HOSPITAL_COMMUNITY)
Admission: RE | Disposition: A | Discharge status: Home / Self Care | Payer: Self-pay | Source: Ambulatory Visit | Attending: Orthopedic Surgery

## 2019-04-28 ENCOUNTER — Inpatient Hospital Stay (HOSPITAL_COMMUNITY)
Admission: RE | Admit: 2019-04-28 | Discharge: 2019-04-30 | DRG: 617 | Disposition: A | Discharge status: Skilled Nursing Facility | Payer: Medicare (Managed Care) | Attending: Orthopedic Surgery | Admitting: Orthopedic Surgery

## 2019-04-28 ENCOUNTER — Inpatient Hospital Stay (HOSPITAL_COMMUNITY): Payer: Medicare (Managed Care) | Admitting: Anesthesiology

## 2019-04-28 ENCOUNTER — Encounter (HOSPITAL_COMMUNITY): Payer: Self-pay | Admitting: Orthopedic Surgery

## 2019-04-28 DIAGNOSIS — Z79891 Long term (current) use of opiate analgesic: Secondary | ICD-10-CM | POA: Diagnosis not present

## 2019-04-28 DIAGNOSIS — Z20822 Contact with and (suspected) exposure to covid-19: Secondary | ICD-10-CM | POA: Diagnosis present

## 2019-04-28 DIAGNOSIS — E1169 Type 2 diabetes mellitus with other specified complication: Principal | ICD-10-CM | POA: Diagnosis present

## 2019-04-28 DIAGNOSIS — I69354 Hemiplegia and hemiparesis following cerebral infarction affecting left non-dominant side: Secondary | ICD-10-CM

## 2019-04-28 DIAGNOSIS — Z8546 Personal history of malignant neoplasm of prostate: Secondary | ICD-10-CM | POA: Diagnosis not present

## 2019-04-28 DIAGNOSIS — N189 Chronic kidney disease, unspecified: Secondary | ICD-10-CM | POA: Diagnosis present

## 2019-04-28 DIAGNOSIS — I129 Hypertensive chronic kidney disease with stage 1 through stage 4 chronic kidney disease, or unspecified chronic kidney disease: Secondary | ICD-10-CM | POA: Diagnosis present

## 2019-04-28 DIAGNOSIS — Z7902 Long term (current) use of antithrombotics/antiplatelets: Secondary | ICD-10-CM | POA: Diagnosis not present

## 2019-04-28 DIAGNOSIS — Z79899 Other long term (current) drug therapy: Secondary | ICD-10-CM

## 2019-04-28 DIAGNOSIS — E1152 Type 2 diabetes mellitus with diabetic peripheral angiopathy with gangrene: Secondary | ICD-10-CM | POA: Diagnosis present

## 2019-04-28 DIAGNOSIS — E1122 Type 2 diabetes mellitus with diabetic chronic kidney disease: Secondary | ICD-10-CM | POA: Diagnosis present

## 2019-04-28 DIAGNOSIS — L02612 Cutaneous abscess of left foot: Secondary | ICD-10-CM | POA: Diagnosis present

## 2019-04-28 DIAGNOSIS — M86172 Other acute osteomyelitis, left ankle and foot: Secondary | ICD-10-CM | POA: Diagnosis present

## 2019-04-28 DIAGNOSIS — I739 Peripheral vascular disease, unspecified: Secondary | ICD-10-CM | POA: Diagnosis not present

## 2019-04-28 DIAGNOSIS — F17210 Nicotine dependence, cigarettes, uncomplicated: Secondary | ICD-10-CM | POA: Diagnosis present

## 2019-04-28 DIAGNOSIS — Z8719 Personal history of other diseases of the digestive system: Secondary | ICD-10-CM

## 2019-04-28 DIAGNOSIS — E785 Hyperlipidemia, unspecified: Secondary | ICD-10-CM | POA: Diagnosis present

## 2019-04-28 HISTORY — PX: BELOW KNEE LEG AMPUTATION: SUR23

## 2019-04-28 HISTORY — PX: AMPUTATION: SHX166

## 2019-04-28 LAB — BASIC METABOLIC PANEL WITH GFR
Anion gap: 12 (ref 5–15)
BUN: 15 mg/dL (ref 8–23)
CO2: 24 mmol/L (ref 22–32)
Calcium: 9.5 mg/dL (ref 8.9–10.3)
Chloride: 96 mmol/L — ABNORMAL LOW (ref 98–111)
Creatinine, Ser: 1.03 mg/dL (ref 0.61–1.24)
GFR calc Af Amer: >60 mL/min (ref >60)
GFR calc non Af Amer: >60 mL/min (ref >60)
Glucose, Bld: 251 mg/dL — ABNORMAL HIGH (ref 70–99)
Potassium: 3.9 mmol/L (ref 3.5–5.1)
Sodium: 132 mmol/L — ABNORMAL LOW (ref 135–145)

## 2019-04-28 LAB — CBC
HCT: 40.8 % (ref 39.0–52.0)
Hemoglobin: 12.9 g/dL — ABNORMAL LOW (ref 13.0–17.0)
MCH: 28.9 pg (ref 26.0–34.0)
MCHC: 31.6 g/dL (ref 30.0–36.0)
MCV: 91.3 fL (ref 80.0–100.0)
Platelets: 439 10*3/uL — ABNORMAL HIGH (ref 150–400)
RBC: 4.47 MIL/uL (ref 4.22–5.81)
RDW: 11.6 % (ref 11.5–15.5)
WBC: 14 10*3/uL — ABNORMAL HIGH (ref 4.0–10.5)
nRBC: 0 % (ref 0.0–0.2)

## 2019-04-28 LAB — GLUCOSE, CAPILLARY
Glucose-Capillary: 245 mg/dL — ABNORMAL HIGH (ref 70–99)
Glucose-Capillary: 208 mg/dL — ABNORMAL HIGH (ref 70–99)
Glucose-Capillary: 205 mg/dL — ABNORMAL HIGH (ref 70–99)
Glucose-Capillary: 117 mg/dL — ABNORMAL HIGH (ref 70–99)
Glucose-Capillary: 140 mg/dL — ABNORMAL HIGH (ref 70–99)

## 2019-04-28 SURGERY — AMPUTATION BELOW KNEE
Anesthesia: Monitor Anesthesia Care | Site: Knee | Laterality: Left

## 2019-04-28 MED ORDER — GABAPENTIN 800 MG PO TABS
800.0000 mg | ORAL_TABLET | Freq: Every day | ORAL | Status: DC
  Filled 2019-04-28: qty 1

## 2019-04-28 MED ORDER — ACETAMINOPHEN 500 MG PO TABS
1000.0000 mg | ORAL_TABLET | Freq: Three times a day (TID) | ORAL | Status: DC
  Administered 2019-04-28 – 2019-04-30 (×6): 1000 mg via ORAL
  Filled 2019-04-28 (×7): qty 2

## 2019-04-28 MED ORDER — ACETAMINOPHEN 500 MG PO TABS: 1000.0000 mg | ORAL_TABLET | Freq: Once | ORAL | Status: DC | PRN

## 2019-04-28 MED ORDER — CEFAZOLIN SODIUM-DEXTROSE 2-4 GM/100ML-% IV SOLN
2.0000 g | INTRAVENOUS | Status: AC
  Administered 2019-04-28: 2 g via INTRAVENOUS

## 2019-04-28 MED ORDER — METOCLOPRAMIDE HCL 5 MG/ML IJ SOLN: 5.0000 mg | Freq: Three times a day (TID) | INTRAMUSCULAR | Status: DC | PRN

## 2019-04-28 MED ORDER — DOCUSATE SODIUM 100 MG PO CAPS
100.0000 mg | ORAL_CAPSULE | Freq: Two times a day (BID) | ORAL | Status: DC
  Administered 2019-04-29: 21:00:00 100 mg via ORAL
  Filled 2019-04-28 (×4): qty 1

## 2019-04-28 MED ORDER — MIDAZOLAM HCL 2 MG/2ML IJ SOLN: 1.0000 mg | Freq: Once | INTRAMUSCULAR | Status: AC

## 2019-04-28 MED ORDER — METHOCARBAMOL 500 MG PO TABS
500.0000 mg | ORAL_TABLET | Freq: Four times a day (QID) | ORAL | Status: DC | PRN
  Administered 2019-04-28 – 2019-04-30 (×4): 500 mg via ORAL
  Filled 2019-04-28 (×4): qty 1

## 2019-04-28 MED ORDER — PROPOFOL 10 MG/ML IV BOLUS
INTRAVENOUS | Status: DC | PRN
  Administered 2019-04-28: 20 mg via INTRAVENOUS

## 2019-04-28 MED ORDER — ATORVASTATIN CALCIUM 10 MG PO TABS
20.0000 mg | ORAL_TABLET | Freq: Every day | ORAL | Status: DC
  Administered 2019-04-29 – 2019-04-30 (×2): 20 mg via ORAL
  Filled 2019-04-28 (×2): qty 2

## 2019-04-28 MED ORDER — LACTATED RINGERS IV SOLN: INTRAVENOUS | Status: DC

## 2019-04-28 MED ORDER — ALFUZOSIN HCL ER 10 MG PO TB24
10.0000 mg | ORAL_TABLET | Freq: Every day | ORAL | Status: DC
  Administered 2019-04-29 – 2019-04-30 (×2): 10 mg via ORAL
  Filled 2019-04-28 (×2): qty 1

## 2019-04-28 MED ORDER — AMLODIPINE BESYLATE 5 MG PO TABS
5.0000 mg | ORAL_TABLET | Freq: Every day | ORAL | Status: DC
  Administered 2019-04-29 – 2019-04-30 (×2): 5 mg via ORAL
  Filled 2019-04-28 (×2): qty 1

## 2019-04-28 MED ORDER — CEFAZOLIN SODIUM-DEXTROSE 1-4 GM/50ML-% IV SOLN
1.0000 g | Freq: Four times a day (QID) | INTRAVENOUS | Status: AC
  Administered 2019-04-28 – 2019-04-29 (×3): 1 g via INTRAVENOUS
  Filled 2019-04-28 (×3): qty 50

## 2019-04-28 MED ORDER — ONDANSETRON HCL 4 MG/2ML IJ SOLN: 4.0000 mg | Freq: Four times a day (QID) | INTRAMUSCULAR | Status: DC | PRN

## 2019-04-28 MED ORDER — METFORMIN HCL 500 MG PO TABS
500.0000 mg | ORAL_TABLET | Freq: Two times a day (BID) | ORAL | Status: DC
  Administered 2019-04-29 – 2019-04-30 (×2): 500 mg via ORAL
  Filled 2019-04-28 (×3): qty 1

## 2019-04-28 MED ORDER — ROPINIROLE HCL 0.5 MG PO TABS
0.5000 mg | ORAL_TABLET | Freq: Every day | ORAL | Status: DC
  Administered 2019-04-28 – 2019-04-29 (×2): 0.5 mg via ORAL
  Filled 2019-04-28 (×2): qty 1

## 2019-04-28 MED ORDER — GLIPIZIDE ER 5 MG PO TB24
5.0000 mg | ORAL_TABLET | Freq: Every day | ORAL | Status: DC
  Administered 2019-04-29 – 2019-04-30 (×2): 5 mg via ORAL
  Filled 2019-04-28 (×2): qty 1

## 2019-04-28 MED ORDER — TRAZODONE HCL 50 MG PO TABS
100.0000 mg | ORAL_TABLET | Freq: Every day | ORAL | Status: DC
  Administered 2019-04-28 – 2019-04-29 (×2): 100 mg via ORAL
  Filled 2019-04-28 (×2): qty 2

## 2019-04-28 MED ORDER — ONDANSETRON HCL 4 MG/2ML IJ SOLN
INTRAMUSCULAR | Status: DC | PRN
  Administered 2019-04-28: 4 mg via INTRAVENOUS

## 2019-04-28 MED ORDER — INSULIN ASPART 100 UNIT/ML ~~LOC~~ SOLN
0.0000 [IU] | Freq: Three times a day (TID) | SUBCUTANEOUS | Status: DC
  Administered 2019-04-29 – 2019-04-30 (×3): 2 [IU] via SUBCUTANEOUS

## 2019-04-28 MED ORDER — SODIUM CHLORIDE 0.9 % IV SOLN: INTRAVENOUS | Status: DC

## 2019-04-28 MED ORDER — HYDROMORPHONE HCL 1 MG/ML IJ SOLN
0.5000 mg | INTRAMUSCULAR | Status: DC | PRN
  Administered 2019-04-29 (×4): 0.5 mg via INTRAVENOUS
  Filled 2019-04-28 (×5): qty 1

## 2019-04-28 MED ORDER — METHOCARBAMOL 1000 MG/10ML IJ SOLN
500.0000 mg | Freq: Four times a day (QID) | INTRAVENOUS | Status: DC | PRN
  Filled 2019-04-28: qty 5

## 2019-04-28 MED ORDER — FENTANYL CITRATE (PF) 100 MCG/2ML IJ SOLN: 25.0000 ug | INTRAMUSCULAR | Status: DC | PRN

## 2019-04-28 MED ORDER — MIDAZOLAM HCL 2 MG/2ML IJ SOLN
INTRAMUSCULAR | Status: AC
  Administered 2019-04-28: 1 mg via INTRAVENOUS
  Filled 2019-04-28: qty 2

## 2019-04-28 MED ORDER — ACETAMINOPHEN 10 MG/ML IV SOLN: 1000.0000 mg | Freq: Once | INTRAVENOUS | Status: DC | PRN

## 2019-04-28 MED ORDER — OXYCODONE HCL 5 MG PO TABS
5.0000 mg | ORAL_TABLET | ORAL | Status: DC | PRN
  Administered 2019-04-28 – 2019-04-29 (×3): 10 mg via ORAL
  Filled 2019-04-28 (×3): qty 2

## 2019-04-28 MED ORDER — ONDANSETRON HCL 4 MG PO TABS: 4.0000 mg | ORAL_TABLET | Freq: Four times a day (QID) | ORAL | Status: DC | PRN

## 2019-04-28 MED ORDER — FENTANYL CITRATE (PF) 100 MCG/2ML IJ SOLN
INTRAMUSCULAR | Status: AC
  Administered 2019-04-28: 50 ug via INTRAVENOUS
  Filled 2019-04-28: qty 2

## 2019-04-28 MED ORDER — GABAPENTIN 400 MG PO CAPS
800.0000 mg | ORAL_CAPSULE | Freq: Every day | ORAL | Status: DC
  Administered 2019-04-28 – 2019-04-29 (×2): 800 mg via ORAL
  Filled 2019-04-28 (×2): qty 2

## 2019-04-28 MED ORDER — PROPOFOL 500 MG/50ML IV EMUL
INTRAVENOUS | Status: DC | PRN
  Administered 2019-04-28: 100 ug/kg/min via INTRAVENOUS

## 2019-04-28 MED ORDER — ACETAMINOPHEN 325 MG PO TABS: 325.0000 mg | ORAL_TABLET | Freq: Four times a day (QID) | ORAL | Status: DC | PRN

## 2019-04-28 MED ORDER — 0.9 % SODIUM CHLORIDE (POUR BTL) OPTIME
TOPICAL | Status: DC | PRN
  Administered 2019-04-28: 1000 mL

## 2019-04-28 MED ORDER — CEFAZOLIN SODIUM-DEXTROSE 2-4 GM/100ML-% IV SOLN
INTRAVENOUS | Status: AC
  Filled 2019-04-28: qty 100

## 2019-04-28 MED ORDER — FENTANYL CITRATE (PF) 100 MCG/2ML IJ SOLN: 50.0000 ug | Freq: Once | INTRAMUSCULAR | Status: AC

## 2019-04-28 MED ORDER — DONEPEZIL HCL 10 MG PO TABS
10.0000 mg | ORAL_TABLET | Freq: Every day | ORAL | Status: DC
  Administered 2019-04-28 – 2019-04-29 (×2): 10 mg via ORAL
  Filled 2019-04-28 (×2): qty 1

## 2019-04-28 MED ORDER — METOCLOPRAMIDE HCL 5 MG PO TABS: 5.0000 mg | ORAL_TABLET | Freq: Three times a day (TID) | ORAL | Status: DC | PRN

## 2019-04-28 MED ORDER — OXYCODONE HCL 5 MG PO TABS: 5.0000 mg | ORAL_TABLET | Freq: Once | ORAL | Status: DC | PRN

## 2019-04-28 MED ORDER — OXYCODONE HCL 5 MG/5ML PO SOLN: 5.0000 mg | Freq: Once | ORAL | Status: DC | PRN

## 2019-04-28 MED ORDER — ACETAMINOPHEN 160 MG/5ML PO SOLN: 1000.0000 mg | Freq: Once | ORAL | Status: DC | PRN

## 2019-04-28 MED ORDER — LOSARTAN POTASSIUM 50 MG PO TABS
50.0000 mg | ORAL_TABLET | Freq: Every day | ORAL | Status: DC
  Administered 2019-04-29 – 2019-04-30 (×2): 50 mg via ORAL
  Filled 2019-04-28 (×2): qty 1

## 2019-04-28 MED ORDER — ASPIRIN EC 81 MG PO TBEC
81.0000 mg | DELAYED_RELEASE_TABLET | Freq: Every day | ORAL | Status: DC
  Administered 2019-04-29 – 2019-04-30 (×2): 81 mg via ORAL
  Filled 2019-04-28 (×2): qty 1

## 2019-04-28 SURGICAL SUPPLY — 37 items
BLADE SAW RECIP 87.9 MT (BLADE) ×2 IMPLANT
BLADE SURG 21 STRL SS (BLADE) ×2 IMPLANT
BNDG COHESIVE 6X5 TAN STRL LF (GAUZE/BANDAGES/DRESSINGS) IMPLANT
CANISTER WOUND CARE 500ML ATS (WOUND CARE) ×2 IMPLANT
COVER SURGICAL LIGHT HANDLE (MISCELLANEOUS) ×2 IMPLANT
COVER WAND RF STERILE (DRAPES) IMPLANT
CUFF TOURN SGL QUICK 34 (TOURNIQUET CUFF) ×2
CUFF TRNQT CYL 34X4.125X (TOURNIQUET CUFF) ×1 IMPLANT
DRAPE INCISE IOBAN 66X45 STRL (DRAPES) ×2 IMPLANT
DRAPE U-SHAPE 47X51 STRL (DRAPES) ×2 IMPLANT
DRESSING PREVENA PLUS CUSTOM (GAUZE/BANDAGES/DRESSINGS) ×1 IMPLANT
DRSG PREVENA PLUS CUSTOM (GAUZE/BANDAGES/DRESSINGS) ×2
DURAPREP 26ML APPLICATOR (WOUND CARE) ×2 IMPLANT
ELECT REM PT RETURN 9FT ADLT (ELECTROSURGICAL) ×2
ELECTRODE REM PT RTRN 9FT ADLT (ELECTROSURGICAL) ×1 IMPLANT
GLOVE BIOGEL PI IND STRL 9 (GLOVE) ×1 IMPLANT
GLOVE BIOGEL PI INDICATOR 9 (GLOVE) ×1
GLOVE SURG ORTHO 9.0 STRL STRW (GLOVE) ×2 IMPLANT
GOWN STRL REUS W/ TWL XL LVL3 (GOWN DISPOSABLE) ×2 IMPLANT
GOWN STRL REUS W/TWL XL LVL3 (GOWN DISPOSABLE) ×4
KIT BASIN OR (CUSTOM PROCEDURE TRAY) ×2 IMPLANT
KIT TURNOVER KIT B (KITS) ×2 IMPLANT
MANIFOLD NEPTUNE II (INSTRUMENTS) ×2 IMPLANT
NS IRRIG 1000ML POUR BTL (IV SOLUTION) ×2 IMPLANT
PACK ORTHO EXTREMITY (CUSTOM PROCEDURE TRAY) ×2 IMPLANT
PAD ARMBOARD 7.5X6 YLW CONV (MISCELLANEOUS) ×2 IMPLANT
PREVENA RESTOR ARTHOFORM 46X30 (CANNISTER) ×2 IMPLANT
SPONGE LAP 18X18 RF (DISPOSABLE) IMPLANT
STAPLER VISISTAT 35W (STAPLE) IMPLANT
STOCKINETTE IMPERVIOUS LG (DRAPES) ×2 IMPLANT
SUT ETHILON 2 0 PSLX (SUTURE) IMPLANT
SUT SILK 2 0 (SUTURE) ×2
SUT SILK 2-0 18XBRD TIE 12 (SUTURE) ×1 IMPLANT
SUT VIC AB 1 CTX 27 (SUTURE) ×4 IMPLANT
TOWEL GREEN STERILE (TOWEL DISPOSABLE) ×2 IMPLANT
TUBE CONNECTING 12X1/4 (SUCTIONS) ×2 IMPLANT
YANKAUER SUCT BULB TIP NO VENT (SUCTIONS) ×2 IMPLANT

## 2019-04-28 NOTE — Transfer of Care (Signed)
Immediate Anesthesia Transfer of Care Note  Patient: Jimel Myler Slayton  Procedure(s) Performed: LEFT BELOW KNEE AMPUTATION (Left Knee)  Patient Location: PACU  Anesthesia Type:MAC combined with regional for post-op pain  Level of Consciousness: drowsy and patient cooperative  Airway & Oxygen Therapy: Patient Spontanous Breathing  Post-op Assessment: Report given to RN and Post -op Vital signs reviewed and stable  Post vital signs: Reviewed and stable  Last Vitals:  Vitals Value Taken Time  BP 124/67 04/28/19 1221  Temp    Pulse 94 04/28/19 1224  Resp 11 04/28/19 1224  SpO2 97 % 04/28/19 1224  Vitals shown include unvalidated device data.  Last Pain:  Vitals:   04/28/19 1135  TempSrc:   PainSc: 0-No pain      Patients Stated Pain Goal: 0 (35/07/57 3225)  Complications: No apparent anesthesia complications

## 2019-04-28 NOTE — H&P (Signed)
Billy Torres is an 64 y.o. male.   Chief Complaint: Gangrene Left Foot HPI: Patient is a 64 year old gentleman who is seen for initial evaluation for [place] foul-smelling necrotic gangrenous ulcer dorsal aspect left foot.  Patient has had arteriographic studies with vascular vein surgery in December 2020 and had a stent placed in the left SFA.  Patient's ankle-brachial indices in January 2021 were 1.22 with a toe brachial indices of 1.55.  Patient has had an MRI scan of the left foot in November 2020 which did not show any bony infection.  He states that he currently has uncontrollable pain in the left foot from the ischemic changes.  Past Medical History:  Diagnosis Date  . Abnormal rate of speech    post stroke  . Anticoagulant long-term use    plavix  . Arthritis of left shoulder region   . At high risk for falls   . Chronic constipation   . Chronic kidney disease    chronic kidney disease   . Cognitive deficit due to old cerebral infarction   . Constipation   . DDD (degenerative disc disease), cervical   . Degenerative cervical spinal stenosis   . Dysphagia   . Frequency of urination   . Gait disturbance, post-stroke    uses cane  . History of ischemic left MCA stroke 06/27/2013   w/ evidence hemorrhagia, high grade stenosis bilateral proximal MCA and extensive small matter disease--- symptoms resolved  . History of ischemic stroke without residual deficits 05-14-2015---  residual left hemiparesis and cognitive deficits   small nonhemorrhegic infarct right corona radiata posterior limb and progressive right m2 stenosis  . Hyperlipidemia   . Hypertension   . Indwelling urethral catheter present   . Insomnia   . Internal hemorrhoids   . Left hemiparesis (HCC)    RESIDUAL FROM STROKE--  WALKS W/ CANE  . Mild atherosclerosis of carotid artery, right    RICA 0-39% per duplex 06-28-2013  . Night sweats   . OA (osteoarthritis)    LEFT SHOULDER  . Painful bladder spasm   .  Peripheral arterial disease ([place])   . Prostate cancer [facility]) urologist-  dr Gaynelle Arabian  oncologist-  dr [name]   dx 12/ 2017-- Stage T1c, Gleason 3+4,  PSA 10.4,  vol 51cc  . Sensorineural hearing loss, bilateral   . Type 2 diabetes mellitus treated with insulin ([place])   . Urge incontinence    wears depends  . Urinary retention   . Wears dentures     Past Surgical History:  Procedure Laterality Date  . ABDOMINAL AORTOGRAM W/LOWER EXTREMITY N/A 12/11/2018   Procedure: ABDOMINAL AORTOGRAM W/LOWER EXTREMITY;  Surgeon: Angelia Mould, MD;  Location: [facility];  Service: Cardiovascular;  Laterality: N/A;  . ABDOMINAL AORTOGRAM W/LOWER EXTREMITY Bilateral 04/02/2019   Procedure: ABDOMINAL AORTOGRAM W/LOWER EXTREMITY;  Surgeon: Angelia Mould, MD;  Location: [facility];  Service: Cardiovascular;  Laterality: Bilateral;  . ANTERIOR CERVICAL DECOMP/DISCECTOMY FUSION  11/27/1999   C5 -- C6  . CIRCUMCISION  10/07/2001  . CYSTOSCOPY WITH INSERTION OF UROLIFT N/A 08/11/2017   Procedure: CYSTOSCOPY WITH INSERTION OF UROLIFT;  Surgeon: Cleon Gustin, MD;  Location: [facility];  Service: Urology;  Laterality: N/A;  . FOOT SURGERY  2005  . PERIPHERAL VASCULAR BALLOON ANGIOPLASTY  12/11/2018   Procedure: PERIPHERAL VASCULAR BALLOON ANGIOPLASTY;  Surgeon: Angelia Mould, MD;  Location: [place] [facility];  Service: Cardiovascular;;  Lt. SFA  . PROSTATE BIOPSY  12/12/2015  . RADIOACTIVE SEED IMPLANT N/A 05/30/2016   Procedure: RADIOACTIVE SEED IMPLANT/BRACHYTHERAPY IMPLANT WITH SPACE OAR;  Surgeon: Carolan Clines, MD;  Location: [facility];  Service: Urology;  Laterality: N/A;  . SHOULDER ARTHROSCOPY WITH SUBACROMIAL DECOMPRESSION Left 11/28/2006   Debridement/ Acromioplasty/  CA ligament relesase  . TRANSTHORACIC ECHOCARDIOGRAM  05/15/2015   mild LVH,  ef 65-70%, codominant mitral inflow-iindeterminate diastolic function/      Family History   Problem Relation Age of Onset  . Hypertension Mother   . Hypertension Father   . Cancer Brother        prostate   Social History:  reports that he has been smoking cigarettes. He has a 11.25 pack-year smoking history. He has never used smokeless tobacco. He reports that he does not drink alcohol or use drugs.  Allergies: No Known Allergies  No medications prior to admission.    Results for orders placed or performed during the hospital encounter of 04/27/19 (from the past 48 hour(s))  SARS CORONAVIRUS 2 (TAT 6-24 HRS) Nasopharyngeal Nasopharyngeal Swab     Status: None   Collection Time: 04/27/19  2:47 PM   Specimen: Nasopharyngeal Swab  Result Value Ref Range   SARS Coronavirus 2 NEGATIVE NEGATIVE    Comment: (NOTE) SARS-CoV-2 target nucleic acids are NOT DETECTED. The SARS-CoV-2 RNA is generally detectable in upper and lower respiratory specimens during the acute phase of infection. Negative results do not preclude SARS-CoV-2 infection, do not rule out co-infections with other pathogens, and should not be used as the sole basis for treatment or other patient management decisions. Negative results must be combined with clinical observations, patient history, and epidemiological information. The expected result is Negative. Fact Sheet for Patients: SugarRoll.be Fact Sheet for Healthcare Providers: https://www.woods-mathews.com/ This test is not yet approved or cleared by the [place] FDA and  has been authorized for detection and/or diagnosis of SARS-CoV-2 by FDA under an Emergency Use Authorization (EUA). This EUA will remain  in effect (meaning this test can be used) for the duration of the COVID-19 declaration under Section 56 4(b)(1) of the Act, 21 U.S.C. section 360bbb-3(b)(1), unless the authorization is terminated or revoked sooner. Performed at [facility], [place] [address], [place], [place] [address]    No  results found.  Review of Systems  All other systems reviewed and are negative.   There were no vitals taken for this visit. Physical Exam  Patient is alert, oriented, no adenopathy, well-dressed, normal affect, normal respiratory effort. Examination patient has a foul-smelling necrotic ulcer over the dorsal aspect of the left foot.  There is necrotic tissue odor and drainage with exposed tendons and black necrotic changes down to bone.  Touching the wound is extremely painful.  Patient does not have a palpable dorsalis pedis pulse with Doppler was used and he has a dampened monophasic dorsalis pedis and posterior tibial pulse.Heart RRR Lungs Clear Assessment/Plan 1. Gangrene of left foot ([place])     Plan: Discussed with the patient that we could attempt a partial foot salvage intervention with a midfoot amputation.  Patient states that with the global pain in his foot he would prefer proceeding with a transtibial amputation with a better chance of healing and functional outcome with a prosthesis.  I agree with the patient that this is his best option.  We will request authorization from [facility] to proceed with a left transtibial amputation.  Patient will need either inpatient or outpatient rehabilitation postoperatively.  Bevely Palmer [place] Gustafson, [place] 04/28/2019, 6:42 AM

## 2019-04-28 NOTE — Anesthesia Procedure Notes (Signed)
Procedure Name: MAC Date/Time: 04/28/2019 11:53 AM Performed by: Imagene Riches, CRNA Pre-anesthesia Checklist: Patient identified, Emergency Drugs available, Suction available, Patient being monitored and Timeout performed Patient Re-evaluated:Patient Re-evaluated prior to induction Oxygen Delivery Method: Simple face mask

## 2019-04-28 NOTE — Anesthesia Preprocedure Evaluation (Addendum)
Anesthesia Evaluation  Patient identified by MRN, date of birth, ID band Patient awake    Reviewed: Allergy & Precautions, NPO status , Patient's Chart, lab work & pertinent test results  History of Anesthesia Complications (+) history of anesthetic complications  Airway Mallampati: II  TM Distance: >3 FB Neck ROM: Full    Dental  (+) Dental Advisory Given   Pulmonary neg recent URI, Current Smoker,    breath sounds clear to auscultation       Cardiovascular hypertension, + Peripheral Vascular Disease   Rhythm:Regular     Neuro/Psych PSYCHIATRIC DISORDERS Dementia TIA   GI/Hepatic negative GI ROS, Neg liver ROS,   Endo/Other  diabetes  Renal/GU Renal disease     Musculoskeletal  (+) Arthritis ,   Abdominal   Peds  Hematology  (+) Blood dyscrasia, anemia ,   Anesthesia Other Findings - Left ventricle: The cavity size was normal. Wall thickness was  increased in a pattern of mild LVH. Systolic function was  vigorous. The estimated ejection fraction was in the range of 65%  to 70%. Wall motion was normal; there were no regional wall  motion abnormalities. Codominant mitral inflow - indeterminate  diastolic function.  - Left atrium: The atrium was normal in size.  - Inferior vena cava: The vessel was normal in size. The  respirophasic diameter changes were in the normal range (>= 50%),  consistent with normal central venous pressure.   Reproductive/Obstetrics                            Anesthesia Physical Anesthesia Plan  ASA: III  Anesthesia Plan: MAC and Regional   Post-op Pain Management:    Induction: Intravenous  PONV Risk Score and Plan: 0 and Treatment may vary due to age or medical condition and Propofol infusion  Airway Management Planned: Nasal Cannula  Additional Equipment: None  Intra-op Plan:   Post-operative Plan:   Informed Consent: I have  reviewed the patients History and Physical, chart, labs and discussed the procedure including the risks, benefits and alternatives for the proposed anesthesia with the patient or authorized representative who has indicated his/her understanding and acceptance.     Dental advisory given  Plan Discussed with: CRNA and Surgeon  Anesthesia Plan Comments:        Anesthesia Quick Evaluation

## 2019-04-28 NOTE — Op Note (Signed)
   Date of Surgery: 04/28/2019  INDICATIONS: Mr. Crabb is a 64 y.o.-year-old male who has necrotic ulcer abscess dorsal aspect of the left foot.  Patient has undergone revascularization to the left lower extremity still has persistent painful gangrenous ulcer with foul-smelling drainage.Marland Kitchen  PREOPERATIVE DIAGNOSIS: Ulceration abscess with foul-smelling drainage dorsal left foot  POSTOPERATIVE DIAGNOSIS: Same.  PROCEDURE: Transtibial amputation Application of Prevena wound VAC  SURGEON: Sharol Given, M.D.  ANESTHESIA:  general  IV FLUIDS AND URINE: See anesthesia.  ESTIMATED BLOOD LOSS: Minimal mL.  COMPLICATIONS: None.  DESCRIPTION OF PROCEDURE: The patient was brought to the operating room and underwent a general anesthetic. After adequate levels of anesthesia were obtained patient's lower extremity was prepped using DuraPrep draped into a sterile field. A timeout was called. The foot was draped out of the sterile field with impervious stockinette. A transverse incision was made 11 cm distal to the tibial tubercle. This curved proximally and a large posterior flap was created. The tibia was transected 1 cm proximal to the skin incision. The fibula was transected just proximal to the tibial incision. The tibia was beveled anteriorly. A large posterior flap was created. The sciatic nerve was pulled cut and allowed to retract. The vascular bundles were suture ligated with 2-0 silk. The deep and superficial fascial layers were closed using #1 Vicryl. The skin was closed using staples and 2-0 nylon. The wound was covered with a Prevena wound VAC. There was a good suction fit. A prosthetic shrinker was applied. Patient was extubated taken to the PACU in stable condition.   DISCHARGE PLANNING:  Antibiotic duration: 24 hours  Weightbearing: Nonweightbearing on the left  Pain medication: Opioid pathway  Dressing care/ Wound VAC: Continue wound VAC at discharge for 1 week  Discharge to: Anticipate  discharge to inpatient versus outpatient rehab.  [employer] for prosthetic fitting.  Follow-up: In the office 1 week post operative.  Meridee Score, MD [place] 12:23 PM

## 2019-04-28 NOTE — Plan of Care (Signed)

## 2019-04-29 LAB — GLUCOSE, CAPILLARY
Glucose-Capillary: 140 mg/dL — ABNORMAL HIGH (ref 70–99)
Glucose-Capillary: 149 mg/dL — ABNORMAL HIGH (ref 70–99)
Glucose-Capillary: 105 mg/dL — ABNORMAL HIGH (ref 70–99)
Glucose-Capillary: 125 mg/dL — ABNORMAL HIGH (ref 70–99)

## 2019-04-29 MED ORDER — HYDROCODONE-ACETAMINOPHEN 10-325 MG PO TABS
1.0000 | ORAL_TABLET | ORAL | Status: DC | PRN
  Administered 2019-04-29 – 2019-04-30 (×5): 1 via ORAL
  Filled 2019-04-29 (×5): qty 1

## 2019-04-29 NOTE — Evaluation (Signed)
Occupational Therapy Evaluation Patient Details Name: Billy Torres MRN: [id number] DOB: 1955-04-30 Today's Date: 04/29/2019    History of Present Illness this 64 y.o. male admitted with nectrotic gangrenous ulcer on dorsal aspect Lt foot.  He underwent Lt BKA 4/21.  PMH includes:  h.o Lt MCA strok, CKD, h/o cognitive deficit due to CVA, HTN,  PAD, DM    Clinical Impression   Pt admitted with above. He demonstrates the below listed deficits and will benefit from continued OT to maximize safety and independence with BADLs.  Pt was seen in conjunction with PT.  He was limited only to bed mobility due to severity of pain Lt residual limb, despite max encouragement and attempts.  He currently requires min - total A for ADLs, and not able to perform functional transfers.  He reports he lives with his nephew and nephew's fiancee', and they provide close to 24 hour supervision.  He also has an aid 4 hours/day through [facility].  Anticipate he will need SNF level rehab prior to return home.       Follow Up Recommendations  SNF;Supervision/Assistance - 24 hour    Equipment Recommendations  None recommended by OT    Recommendations for Other Services       Precautions / Restrictions Precautions Precautions: Fall Restrictions Weight Bearing Restrictions: Yes LLE Weight Bearing: Non weight bearing      Mobility Bed Mobility Overal bed mobility: Needs Assistance             General bed mobility comments: Min A to scoot hips to EOB   Transfers                 General transfer comment: Pt deferred due to pain     Balance                                           ADL either performed or assessed with clinical judgement   ADL Overall ADL's : Needs assistance/impaired Eating/Feeding: Set up;Bed level   Grooming: Wash/dry hands;Wash/dry face;Oral care;Brushing hair;Set up;Bed level   Upper Body Bathing: Minimal assistance;Bed level   Lower Body Bathing:  Total assistance;Bed level   Upper Body Dressing : Moderate assistance;Bed level   Lower Body Dressing: Total assistance;Bed level   Toilet Transfer: Total assistance Toilet Transfer Details (indicate cue type and reason): unable to attempt due to pain  Toileting- Clothing Manipulation and Hygiene: Total assistance;Bed level         General ADL Comments: Pt very limited by pain      Vision Patient Visual Report: No change from baseline       Perception Perception Perception Tested?: Yes   Praxis Praxis Praxis tested?: Deficits Deficits: Initiation;Organization;Motor Impersistence    Pertinent Vitals/Pain       Hand Dominance Right   Extremity/Trunk Assessment Upper Extremity Assessment Upper Extremity Assessment: LUE deficits/detail LUE Deficits / Details: mild residual weakness Lt UE, with decreased isolated movements of Lt hand  LUE Sensation: decreased light touch LUE Coordination: decreased fine motor;decreased gross motor   Lower Extremity Assessment Lower Extremity Assessment: Defer to PT evaluation       Communication Communication Communication: Expressive difficulties;Other (comment)(slow, quiet speech )   Cognition Arousal/Alertness: Awake/alert Behavior During Therapy: Flat affect Overall Cognitive Status: History of cognitive impairments - at baseline Area of Impairment: Attention;Memory;Following commands;Safety/judgement;Awareness;Problem solving  Current Attention Level: Sustained Memory: Decreased short-term memory Following Commands: Follows one step commands inconsistently;Follows one step commands with increased time Safety/Judgement: Decreased awareness of safety Awareness: Intellectual Problem Solving: Slow processing;Decreased initiation;Difficulty sequencing;Requires verbal cues;Requires tactile cues General Comments: Pt is HOH with compounds cognitive deficits.  He is very slow to intiate activity,  demonstrates delayed responses - hearing loss may be impacting this.  On review of notes from CIR, this appears that he may be close to baseline    General Comments  Pt very limited by pain this session despite max attempts to move to EOB     Exercises     Shoulder Instructions      Home Living Family/patient expects to be discharged to:: Private residence Living Arrangements: Other relatives(nephew, his girlfriend) Available Help at Discharge: Family;Available PRN/intermittently Type of Home: House Home Access: Stairs to enter [employer] of Steps: 6 Entrance Stairs-Rails: Right;Left Home Layout: One level     Bathroom Shower/Tub: Teacher, early years/pre: Standard Bathroom Accessibility: Yes How Accessible: Accessible via wheelchair Home Equipment: Wheelchair - manual;Walker - 2 wheels   Additional Comments: Pt reports he lives with his nephew, and nephew's girlfriend.  He reports nephew works during the day, but the fiancee' is at home during this time.  [facility] to [facility] on [facility] and Th[facility], and has an aid 4 hours/day through [facility]      Prior Functioning/Environment Level of Independence: Needs assistance  Gait / Transfers Assistance Needed: Uses wheelchair for mobility. Requires assist for negotiating steps ADL's / Homemaking Assistance Needed: Pt reports and sister confirms, that pt performs BADLs largely mod I, but does have an aide who assists some with bathing     Comments: Go[facility] to [facility] Tu[facility], has aide from [facility] MWF, 4 hours/day        OT Problem List: Decreased strength;Decreased activity tolerance;Impaired balance (sitting and/or standing);Decreased cognition;Decreased safety awareness;Decreased knowledge of use of DME or AE;Pain;Impaired UE functional use      OT Treatment/Interventions: Self-care/ADL training;DME and/or AE instruction;Therapeutic activities;Cognitive remediation/compensation;Patient/family education;Balance training    OT  Goals(Current goals can be found in the care plan section) Acute Rehab OT Goals Patient Stated Goal: to be able to fish again  OT Goal Formulation: With patient Time For Goal Achievement: 05/13/19 Potential to Achieve Goals: Good ADL Goals Pt Will Perform Upper Body Bathing: (P) with set-up;sitting Pt Will Perform Lower Body Bathing: (P) with min assist;sit to/from stand Pt Will Perform Upper Body Dressing: (P) with set-up;sitting Pt Will Perform Lower Body Dressing: (P) with min assist;sit to/from stand Pt Will Transfer to Toilet: (P) with min assist;stand pivot transfer;bedside commode Pt Will Perform Toileting - Clothing Manipulation and hygiene: (P) with min assist;sit to/from stand  OT Frequency: Min 2X/week   Barriers to D/C: Decreased caregiver support  does not have necessary level of assist at discahrge initially        Co-evaluation PT/OT/SLP Co-Evaluation/Treatment: Yes Reason for Co-Treatment: For patient/therapist safety;To address functional/ADL transfers   OT goals addressed during session: Strengthening/ROM      AM-PAC OT "6 Clicks" Daily Activity     Outcome Measure Help from another person eating meals?: None Help from another person taking care of personal grooming?: A Little Help from another person toileting, which includes using toliet, bedpan, or urinal?: Total Help from another person bathing (including washing, rinsing, drying)?: A Lot Help from another person to put on and taking off regular upper body clothing?: A Lot Help from another  person to put on and taking off regular lower body clothing?: Total 6 Click Score: 13   End of Session Nurse Communication: Mobility status  Activity Tolerance: Patient limited by pain Patient left: in bed;with call bell/phone within reach;with family/visitor present  OT Visit Diagnosis: Pain;Cognitive communication deficit (R41.841) Symptoms and signs involving cognitive functions: Cerebral infarction Pain -  Right/Left: Left Pain - part of body: Leg                Time: [id number] OT Time Calculation (min): 27 min Charges:  OT General Charges $OT Visit: 1 Visit OT Evaluation $OT Eval Moderate Complexity: 1 Mod  Leyan Branden C., OTR/L Acute Rehabilitation Services Pager [phone] Office [phone]   Lucille Passy M 04/29/2019, 1:45 PM

## 2019-04-29 NOTE — Evaluation (Signed)
Physical Therapy Evaluation Patient Details Name: Billy Torres MRN: [id number] DOB: January 18, 1955 Today's Date: 04/29/2019   History of Present Illness  this 64 y.o. male admitted with nectrotic gangrenous ulcer on dorsal aspect Lt foot.  He underwent Lt BKA 4/21.  PMH includes:  h.o Lt MCA strok, CKD, h/o cognitive deficit due to CVA, HTN,  PAD, DM   Clinical Impression  Prior to admission, pt lives with his nephew, has an Engineer, production through [employer], and uses a wheelchair for mobility. Evaluation limited due to severity of pain in left residual limb (despite pre-medication and max encouragement). Attempted to come edge of bed but ultimately refused. Education provided regarding desensitization techniques, phantom pain, prosthetic expectations, knee positioning, progression of mobility. Recommending SNF at discharge.     Follow Up Recommendations SNF;Supervision/Assistance - 24 hour    Equipment Recommendations  Other (comment)(defer)    Recommendations for Other Services       Precautions / Restrictions Precautions Precautions: Fall Restrictions Weight Bearing Restrictions: Yes LLE Weight Bearing: Non weight bearing      Mobility  Bed Mobility Overal bed mobility: Needs Assistance             General bed mobility comments: Min A to scoot hips to EOB   Transfers                 General transfer comment: Pt deferred due to pain   Ambulation/Gait                Stairs            Wheelchair Mobility    Modified Rankin (Stroke Patients Only)       Balance                                             Pertinent Vitals/Pain Pain Assessment: 0-10 Pain Score: 7  Pain Location: L residual limb, phantom pain Pain Descriptors / Indicators: Operative site guarding Pain Intervention(s): Limited activity within patient's tolerance;Monitored during session;Premedicated before session    Home Living Family/patient expects to be discharged  to:: Private residence Living Arrangements: Other relatives(nephew, his girlfriend) Available Help at Discharge: Family;Available PRN/intermittently Type of Home: House Home Access: Stairs to enter Entrance Stairs-Rails: Psychiatric nurse of Steps: 6 Home Layout: One level Home Equipment: Wheelchair - [employer] - 2 wheels Additional Comments: Pt reports he lives with his nephew, and nephew's girlfriend.  He reports nephew works during the day, but the fiancee' is at home during this time.  [facility] and Th[facility], and has an aid 4 hours/day through [facility]    Prior Function Level of Independence: Needs assistance   Gait / Transfers Assistance Needed: Uses wheelchair for mobility. Requires assist for negotiating steps  ADL's / Homemaking Assistance Needed: Pt reports and sister confirms, that pt performs BADLs largely mod I, but does have an aide who assists some with bathing    Comments: Goes to [facility], has aide from [facility] MWF, 4 hours/day     Hand Dominance   Dominant Hand: Right    Extremity/Trunk Assessment   Upper Extremity Assessment Upper Extremity Assessment: Defer to OT evaluation LUE Deficits / Details: mild residual weakness Lt UE, with decreased isolated movements of Lt hand  LUE Sensation: decreased light touch LUE Coordination: decreased fine motor;decreased gross motor    Lower Extremity Assessment  Lower Extremity Assessment: LLE deficits/detail LLE Deficits / Details: s/p BKA. Knee extension lacking ~15 deg from neutral, able to perform SLR       Communication   Communication: Expressive difficulties;Other (comment)(slow, quiet speech)  Cognition Arousal/Alertness: Awake/alert Behavior During Therapy: Flat affect Overall Cognitive Status: History of cognitive impairments - at baseline Area of Impairment: Attention;Memory;Following commands;Safety/judgement;Awareness;Problem solving                   Current Attention  Level: Sustained Memory: Decreased short-term memory Following Commands: Follows one step commands inconsistently;Follows one step commands with increased time Safety/Judgement: Decreased awareness of safety Awareness: Intellectual Problem Solving: Slow processing;Decreased initiation;Difficulty sequencing;Requires verbal cues;Requires tactile cues General Comments: Pt is HOH with compounds cognitive deficits.  He is very slow to intiate activity, demonstrates delayed responses - hearing loss may be impacting this.  On review of notes from CIR, this appears that he may be close to baseline       General Comments      Exercises Amputee Exercises Quad Sets: Left;5 reps;Supine   Assessment/Plan    PT Assessment Patient needs continued PT services  PT Problem List Decreased strength;Decreased activity tolerance;Decreased balance;Decreased mobility;Decreased cognition;Decreased safety awareness;Pain       PT Treatment Interventions DME instruction;Gait training;Functional mobility training;Therapeutic activities;Therapeutic exercise;Balance training;Wheelchair mobility training;Patient/family education    PT Goals (Current goals can be found in the Care Plan section)  Acute Rehab PT Goals Patient Stated Goal: to be able to fish again  PT Goal Formulation: With patient Time For Goal Achievement: 05/13/19 Potential to Achieve Goals: Fair    Frequency Min 3X/week   Barriers to discharge        Co-evaluation PT/OT/SLP Co-Evaluation/Treatment: Yes Reason for Co-Treatment: For patient/therapist safety;To address functional/ADL transfers PT goals addressed during session: Mobility/safety with mobility         AM-PAC PT "6 Clicks" Mobility  Outcome Measure Help needed turning from your back to your side while in a flat bed without using bedrails?: A Little Help needed moving from lying on your back to sitting on the side of a flat bed without using bedrails?: A Lot Help needed  moving to and from a bed to a chair (including a wheelchair)?: Total Help needed standing up from a chair using your arms (e.g., wheelchair or bedside chair)?: Total Help needed to walk in hospital room?: Total Help needed climbing 3-5 steps with a railing? : Total 6 Click Score: 9    End of Session   Activity Tolerance: Patient limited by pain Patient left: in bed;with call bell/phone within reach;with bed alarm set;with family/visitor present Nurse Communication: Mobility status PT Visit Diagnosis: Pain;Other abnormalities of gait and mobility (R26.89) Pain - Right/Left: Left Pain - part of body: (residual limb)    Time: 1020-1050 PT Time Calculation (min) (ACUTE ONLY): 30 min   Charges:   PT Evaluation $PT Eval Moderate Complexity: 1 Mod            Wyona Almas, PT, DPT Acute Rehabilitation Services Pager [phone] Office [phone]   [name] 04/29/2019, 4:52 PM

## 2019-04-29 NOTE — Plan of Care (Signed)

## 2019-04-29 NOTE — Progress Notes (Signed)
Found home meds on Pt's table, packing still intact, advised Pt not to take any meds from home unless otherwise instructed, med placed in Pt's belongings inside the cabinet, no family present at bedside. Pt states understanding.

## 2019-04-29 NOTE — Progress Notes (Signed)
Patient is postop day 1 status post below-knee amputation.  He is complaining of some mild to moderate pain.  Vital signs stable afebrile 0 cc in VAC canister which is functioning well.  Patient will have physical therapy evaluation today for discharge to SNF

## 2019-04-29 NOTE — Progress Notes (Signed)
Inpatient Rehabilitation Admissions Coordinator  I spoke with Levada Dy, RN CNM. [facility] is requesting SNF, not CIR. We will sign off at this time. Please call me with any questions.  Danne Baxter, RN, MSN Rehab Admissions Coordinator [phone] 04/29/2019 12:54 PM

## 2019-04-29 NOTE — TOC Initial Note (Addendum)
Transition of Care [facility]) - Initial/Assessment Note    Patient Details  Name: Billy Torres MRN: [id number] Date of Birth: 08-04-1955  Transition of Care [facility]) CM/SW Contact:    Sharin Mons, RN Phone Number: [phone] 04/29/2019, 9:34 AM  Clinical Narrative:         Admitted with Ulcerated abscess of left foot. S/P Transtibial amputation,application of Prevena wound VAC, 4/21. Resides with nephew Dominica Severin. Active with [facility].        NCM spoke with pt @ bedside in regard to d/c planning. PT/OT evaluations pending. Pt states he's agreeable to SNF/ rehab. if needed.Patient reports no  preference for  SNF. NCM discussed insurance authorization process which is managed by [facility] and provided Medicare SNF ratings list.  NCM had also spoken with pt's SW/Benita [phone]) and informed NCM pt's disposition will be SNF and was given the facilities which would be approved by [facility].  Patient expressed being hopeful for rehab and to feel better soon. No further questions reported at this time. NCM to continue to follow and assist with discharge planning needs  COVID NOTED FOR 04/27/2019   Expected Discharge Plan: [place] Barriers to Discharge: Continued Medical Work up   Patient Goals and CMS Choice        Expected Discharge Plan and Services Expected Discharge Plan: [place]                                              Prior Living Arrangements/Services                       Activities of Daily Living Home Assistive Devices/Equipment: CBG Meter, Cane (specify quad or straight), Dentures (specify type), Wheelchair ADL Screening (condition at time of admission) Patient's cognitive ability adequate to safely complete daily activities?: Yes Is the patient deaf or have difficulty hearing?: No Does the patient have difficulty seeing, even when wearing glasses/contacts?: No Does the patient have  difficulty concentrating, remembering, or making decisions?: No Patient able to express need for assistance with ADLs?: Yes Does the patient have difficulty dressing or bathing?: No Independently performs ADLs?: Yes (appropriate for developmental age) Does the patient have difficulty walking or climbing stairs?: Yes Weakness of Legs: Both Weakness of Arms/Hands: None  Permission Sought/Granted                  Emotional Assessment              Admission diagnosis:  Acute osteomyelitis of left foot [facility]) [id number] Patient Active Problem List   Diagnosis Date Noted  . Acute osteomyelitis of left foot ([place]) 04/28/2019  . Cutaneous abscess of left foot   . PVD (peripheral vascular disease) ([place]) 12/11/2018  . Hydronephrosis due to obstruction of bladder   . Acute urinary retention 08/15/2016  . Malignant neoplasm of prostate ([place]) 02/26/2016  . Muscle spasticity   . Dementia ([place])   . Cerebral infarction due to unspecified mechanism   . Leukocytosis   . Acute thrombotic stroke ([place]) 05/17/2015  . Diabetes mellitus due to underlying condition with complication, without long-term current use of insulin ([place])   . Gait disturbance, post-stroke   . Hemiparesis ([place])   . Cognitive deficit due to old cerebral infarction   . Benign essential HTN   .  Diabetes mellitus type 2 in nonobese ([facility])   . Essential hypertension   . History of CVA (cerebrovascular accident) without residual deficits   . Ataxia, post-stroke   . Dysarthria, post-stroke   . Tobacco abuse   . HLD (hyperlipidemia)   . Acute CVA (cerebrovascular accident) ([place]) 05/15/2015  . Left-sided weakness 05/14/2015  . Right shoulder pain 03/03/2014  . History of CVA (cerebrovascular accident) 02/28/2014  . Smoking 06/29/2013  . CVA (cerebral infarction) 06/29/2013  . Dyslipidemia 06/29/2013  . TIA (transient ischemic attack) 06/27/2013  . Diabetes ([place]) 06/27/2013   PCP:  Angelica Pou, MD Pharmacy:    [id number] - [address] [place] [address] Phone: [phone] Fax: [phone]  [facility] #[id number] [address], [place] [id number] [place] [id number] Phone: [phone] Fax: [phone]     Social Determinants of Health (SDOH) Interventions    Readmission Risk Interventions No flowsheet data found.

## 2019-04-30 LAB — GLUCOSE, CAPILLARY
Glucose-Capillary: 117 mg/dL — ABNORMAL HIGH (ref 70–99)
Glucose-Capillary: 144 mg/dL — ABNORMAL HIGH (ref 70–99)

## 2019-04-30 LAB — SURGICAL PATHOLOGY

## 2019-04-30 MED ORDER — GABAPENTIN 400 MG PO CAPS: 800.0000 mg | ORAL_CAPSULE | Freq: Every day | ORAL | Status: DC

## 2019-04-30 MED ORDER — ACETAMINOPHEN 325 MG PO TABS: 325.0000 mg | ORAL_TABLET | Freq: Four times a day (QID) | ORAL | Status: DC | PRN

## 2019-04-30 MED ORDER — OXYCODONE HCL 5 MG PO TABS: 5.0000 mg | ORAL_TABLET | ORAL | 0 refills | Status: DC | PRN

## 2019-04-30 MED ORDER — OXYCODONE-ACETAMINOPHEN 10-325 MG PO TABS: 1.0000 | ORAL_TABLET | ORAL | 0 refills | Status: DC | PRN

## 2019-04-30 NOTE — [place] FL2 (Signed)
[place] MEDICAID FL2 LEVEL OF CARE SCREENING TOOL     IDENTIFICATION  Patient Name: Billy Torres: 1955-04-16 Sex: male Admission Date (Current Location): 04/28/2019  [facility] and [place] Number:  Herbalist and Address:  [facility], [place] [address], [place], [place] [address]      Provider Number: [id number]  Attending Physician Name and Address:  Newt Minion, MD  Relative Name and Phone Number:       Current Level of Care: Hospital Recommended Level of Care: [place] Prior Approval Number:    Date Approved/Denied:   PASRR Number: [id number]  Discharge Plan: SNF    Current Diagnoses: Patient Active Problem List   Diagnosis Date Noted  . Acute osteomyelitis of left foot ([place]) 04/28/2019  . Cutaneous abscess of left foot   . PVD (peripheral vascular disease) ([place]) 12/11/2018  . Hydronephrosis due to obstruction of bladder   . Acute urinary retention 08/15/2016  . Malignant neoplasm of prostate ([place]) 02/26/2016  . Muscle spasticity   . Dementia ([place])   . Cerebral infarction due to unspecified mechanism   . Leukocytosis   . Acute thrombotic stroke ([place]) 05/17/2015  . Diabetes mellitus due to underlying condition with complication, without long-term current use of insulin ([place])   . Gait disturbance, post-stroke   . Hemiparesis ([place])   . Cognitive deficit due to old cerebral infarction   . Benign essential HTN   . Diabetes mellitus type 2 in nonobese (HCC)   . Essential hypertension   . History of CVA (cerebrovascular accident) without residual deficits   . Ataxia, post-stroke   . Dysarthria, post-stroke   . Tobacco abuse   . HLD (hyperlipidemia)   . Acute CVA (cerebrovascular accident) ([place]) 05/15/2015  . Left-sided weakness 05/14/2015  . Right shoulder pain 03/03/2014  . History of CVA (cerebrovascular accident) 02/28/2014  . Smoking 06/29/2013  . CVA (cerebral infarction) 06/29/2013  .  Dyslipidemia 06/29/2013  . TIA (transient ischemic attack) 06/27/2013  . Diabetes ([place]) 06/27/2013    Orientation RESPIRATION BLADDER Height & Weight     Self, Time, Situation, Place  Normal Continent Weight: 66.2 kg Height:  5\' 8"  (172.7 cm)  BEHAVIORAL SYMPTOMS/MOOD NEUROLOGICAL BOWEL NUTRITION STATUS      Continent Diet(Refer to d/c summary)  AMBULATORY STATUS COMMUNICATION OF NEEDS Skin     Verbally Wound Vac(s/p Transtibial amputationApplication of Prevena wound VAC, 04/28/2019)                       Personal Care Assistance Level of Assistance  Bathing, Feeding, Dressing Bathing Assistance: Maximum assistance Feeding assistance: Independent Dressing Assistance: Maximum assistance     Functional Limitations Info  Sight, Hearing, Speech Sight Info: Adequate Hearing Info: Adequate Speech Info: Adequate    SPECIAL CARE FACTORS FREQUENCY  PT (By licensed PT), OT (By licensed OT)     PT Frequency: 5 x / week, evaluate and treat OT Frequency: 5 x / week, evaluate and treat            Contractures Contractures Info: Not present    Additional Factors Info  Allergies, Code Status Code Status Info: Full Code Allergies Info: No Known Allergies           Current Medications (04/30/2019):  This is the current hospital active medication list Current Facility-Administered Medications  Medication Dose Route Frequency Provider Last Rate Last Admin  . 0.9 %  sodium chloride infusion  Intravenous Continuous Persons, Bevely Palmer, [place] 75 mL/hr at 04/29/19 1936 Rate Verify at 04/29/19 1936  . acetaminophen (TYLENOL) tablet 1,000 mg  1,000 mg Oral TID Persons, Bevely Palmer, PA   1,000 mg at 04/29/19 2102  . acetaminophen (TYLENOL) tablet 325-650 mg  325-650 mg Oral Q6H PRN Persons, Bevely Palmer, PA      . alfuzosin (UROXATRAL) 24 hr tablet 10 mg  10 mg Oral Q breakfast Persons, Bevely Palmer, PA   10 mg at 04/29/19 0906  . amLODipine (NORVASC) tablet 5 mg  5 mg Oral Daily Persons, Bevely Palmer, [place]   5 mg at 04/29/19 0905  . aspirin EC tablet 81 mg  81 mg Oral Daily Persons, Bevely Palmer, [place]   81 mg at 04/29/19 0906  . atorvastatin (LIPITOR) tablet 20 mg  20 mg Oral Daily Persons, Bevely Palmer, [place]   20 mg at 04/29/19 0906  . docusate sodium (COLACE) capsule 100 mg  100 mg Oral BID Persons, Bevely Palmer, PA   100 mg at 04/29/19 2102  . donepezil (ARICEPT) tablet 10 mg  10 mg Oral QHS Persons, Bevely Palmer, [place]   10 mg at 04/29/19 2102  . gabapentin (NEURONTIN) capsule 800 mg  800 mg Oral QHS Newt Minion, MD   800 mg at 04/29/19 2102  . glipiZIDE (GLUCOTROL XL) 24 hr tablet 5 mg  5 mg Oral Q breakfast Persons, Bevely Palmer, PA   5 mg at 04/29/19 0905  . HYDROcodone-acetaminophen (NORCO) 10-325 MG per tablet 1 tablet  1 tablet Oral Q4H PRN Persons, Bevely Palmer, [place]   1 tablet at 04/30/19 0546  . HYDROmorphone (DILAUDID) injection 0.5 mg  0.5 mg Intravenous Q4H PRN Persons, Bevely Palmer, PA   0.5 mg at 04/29/19 1934  . insulin aspart (novoLOG) injection 0-15 Units  0-15 Units Subcutaneous TID WC Persons, Bevely Palmer, [place]   2 Units at 04/29/19 1215  . lactated ringers infusion   Intravenous Continuous Oleta Mouse, MD 400 mL/hr at 04/28/19 1216 Rate Change at 04/28/19 1216  . losartan (COZAAR) tablet 50 mg  50 mg Oral Daily Persons, Bevely Palmer, PA   50 mg at 04/29/19 0905  . metFORMIN (GLUCOPHAGE) tablet 500 mg  500 mg Oral BID WC Persons, Bevely Palmer, PA   500 mg at 04/29/19 1747  . methocarbamol (ROBAXIN) tablet 500 mg  500 mg Oral Q6H PRN Persons, Bevely Palmer, PA   500 mg at 04/29/19 [id number]   Or  . methocarbamol (ROBAXIN) 500 mg in dextrose 5 % 50 mL IVPB  500 mg Intravenous Q6H PRN Persons, Bevely Palmer, PA      . metoCLOPramide (REGLAN) tablet 5-10 mg  5-10 mg Oral Q8H PRN Persons, Bevely Palmer, PA       Or  . metoCLOPramide (REGLAN) injection 5-10 mg  5-10 mg Intravenous Q8H PRN Persons, Bevely Palmer, PA      . ondansetron [facility]) tablet 4 mg  4 mg Oral Q6H PRN Persons, Bevely Palmer, PA       Or  . ondansetron  [facility]) injection 4 mg  4 mg Intravenous Q6H PRN Persons, Bevely Palmer, PA      . rOPINIRole (REQUIP) tablet 0.5 mg  0.5 mg Oral QHS Persons, Bevely Palmer, PA   0.5 mg at 04/29/19 2102  . traZODone (DESYREL) tablet 100 mg  100 mg Oral QHS Persons, Bevely Palmer, [place]   100 mg at 04/29/19 2100     Discharge Medications: Please see discharge summary for  a list of discharge medications.  Relevant Imaging Results:  Relevant Lab Results:   Additional Information SSN: [id number]  Sharin Mons, RN

## 2019-04-30 NOTE — Progress Notes (Signed)
Portable would vac connected, pt educated. Discharge paperwork for receiving facility in drawer. Pt not in distress and tolerated well.

## 2019-04-30 NOTE — Discharge Summary (Signed)
Discharge Diagnoses:  Active Problems:   PVD (peripheral vascular disease) ([place])   Cutaneous abscess of left foot   Acute osteomyelitis of left foot ([place])   Surgeries: Procedure(s): LEFT BELOW KNEE AMPUTATION on 04/28/2019    Consultants:   Discharged Condition: Improved  Hospital Course: Billy Torres is an 64 y.o. male who was admitted 04/28/2019 with a chief complaint of left foot osteomyelitis with a final diagnosis of Gangrene Left Foot.  Patient was brought to the operating room on 04/28/2019 and underwent Procedure(s): LEFT BELOW KNEE AMPUTATION.    Patient was given perioperative antibiotics:  Anti-infectives (From admission, onward)   Start     Dose/Rate Route Frequency Ordered Stop   04/28/19 1800  ceFAZolin (ANCEF) IVPB 1 g/50 mL premix     1 g 100 mL/hr over 30 Minutes Intravenous Every 6 hours 04/28/19 1513 04/29/19 0625   04/28/19 0845  ceFAZolin (ANCEF) IVPB 2g/100 mL premix     2 g 200 mL/hr over 30 Minutes Intravenous On call to O.R. 04/28/19 [id number] 04/28/19 1143   04/28/19 0838  ceFAZolin (ANCEF) 2-4 GM/100ML-% IVPB    Note to Pharmacy: Tamsen Snider   : cabinet override      04/28/19 0838 04/28/19 1149    .  Patient was given sequential compression devices, early ambulation, and aspirin for DVT prophylaxis.  Recent vital signs:  Patient Vitals for the past 24 hrs:  BP Temp Temp src Pulse Resp SpO2  04/30/19 0756 (!) 145/87 98.5 F (36.9 C) Oral (!) 105 17 99 %  04/30/19 0444 (!) 142/80 99.1 F (37.3 C) Oral 96 15 96 %  04/29/19 2009 (!) 146/75 97.9 F (36.6 C) Oral 93 14 96 %  04/29/19 1922 -- -- -- 97 -- 96 %  04/29/19 1433 140/73 99.1 F (37.3 C) Oral 96 15 (!) 88 %  .  Recent laboratory studies: No results found.  Discharge Medications:   Allergies as of 04/30/2019   No Known Allergies     Medication List    STOP taking these medications   oxyCODONE-acetaminophen 10-325 MG tablet Commonly known as: PERCOCET     TAKE these medications    acetaminophen 325 MG tablet Commonly known as: TYLENOL Take 1-2 tablets (325-650 mg total) by mouth every 6 (six) hours as needed for mild pain (pain score 1-3 or temp > 100.5).   alfuzosin 10 MG 24 hr tablet Commonly known as: UROXATRAL Take 10 mg by mouth daily with breakfast.   amLODipine 5 MG tablet Commonly known as: NORVASC Take 5 mg by mouth daily.   aspirin EC 81 MG tablet Take 81 mg by mouth daily.   atorvastatin 20 MG tablet Commonly known as: LIPITOR Take 20 mg by mouth daily.   clopidogrel 75 MG tablet Commonly known as: PLAVIX Take 1 tablet (75 mg total) by mouth daily with breakfast.   donepezil 10 MG tablet Commonly known as: ARICEPT Take 10 mg by mouth at bedtime.   fexofenadine 60 MG tablet Commonly known as: ALLEGRA Take 60 mg by mouth 2 (two) times daily as needed for allergies or rhinitis.   gabapentin 400 MG capsule Commonly known as: NEURONTIN Take 2 capsules (800 mg total) by mouth at bedtime.   losartan 50 MG tablet Commonly known as: COZAAR Take 50 mg by mouth daily.   metFORMIN 500 MG tablet Commonly known as: GLUCOPHAGE Take 500 mg by mouth 2 (two) times daily with a meal.   oxyCODONE 5 MG immediate release tablet  Commonly known as: Oxy IR/ROXICODONE Take 1 tablet (5 mg total) by mouth every 4 (four) hours as needed for severe pain. What changed: when to take this   sildenafil 25 MG tablet Commonly known as: VIAGRA Take 25 mg by mouth daily as needed for erectile dysfunction.   traZODone 100 MG tablet Commonly known as: DESYREL Take 100 mg by mouth at bedtime.       Diagnostic Studies: PERIPHERAL VASCULAR CATHETERIZATION  Result Date: 04/02/2019 PATIENT: Billy Torres      MRN: [id number] DOB: 1955-01-18    DATE OF PROCEDURE: 04/02/2019 INDICATIONS:  Billy Torres is a 64 y.o. male who presents with a nonhealing wound of his left foot on the dorsum.  He is undergone previous angioplasty of the left superficial femoral  artery.  He has peroneal runoff only.  We discussed primary amputation which she is very much opposed to.  He presents for arteriography to see if there are any further options for revascularization on the left. PROCEDURE:  1.  Ultrasound-guided access to the right common femoral artery 2.  Aortogram with bilateral iliac arteriogram 3.  Selective catheterization of the left external iliac artery with left lower extremity runoff (second order catheterization) 4.  Mynx closure right common femoral artery SURGEON: Judeth Cornfield. Scot Dock, MD, FACS ANESTHESIA: Local with sedation EBL: Minimal TECHNIQUE: The patient was brought to the peripheral vascular lab and was sedated. The period of conscious sedation was 31 minutes.  During that time period, I was present face-to-face 100% of the time.  The patient was administered 1 mg of Versed and 50 mcg of fentanyl. The patient's heart rate, blood pressure, and oxygen saturation were monitored by the nurse continuously during the procedure. Both groins were prepped and draped in the usual sterile fashion.  Under ultrasound guidance, after the skin was anesthetized, I cannulated the rightcommon femoral artery with a micropuncture needle and a micropuncture sheath was introduced over a wire.  This was exchanged for a 5 [place] sheath over a Bentson wire.  By ultrasound the femoral artery was patent. A real-time image was obtained and sent to the server. Pigtail catheter was positioned at the L1 vertebral body and flush aortogram obtained.  The catheter was in position above the aortic bifurcation and exchanged for a crossover catheter which was positioned into the left common iliac artery.  I then advanced the wire into the external iliac artery and exchanged the crossover catheter for a straight catheter.  Selective left external iliac arteriogram was obtained with left lower extremity runoff.  This catheter was then removed and a retrograde right femoral arteriogram obtained. At  the completion of the procedure the right groin and catheter were prepped and then the minx closure device advanced through the sheath.  The device was deployed and after 2 minutes the device was removed with good hemostasis.  Pressure was held for 5 minutes.  The patient was transferred to the holding area.  No immediate complications were noted. FINDINGS: 1.  There are 2 renal arteries bilaterally with no significant renal artery stenosis. 2.  The infrarenal aorta, bilateral common iliac arteries and external iliac arteries are widely patent. 3.  On the left side, which is the side of concern, the common femoral and deep femoral artery are patent.  The superficial femoral artery is patent including the previously angioplastied segment.  The popliteal artery is patent.  There is single-vessel runoff via the peroneal artery.  The anterior tibial and posterior tibial arteries are  occluded.  The peroneal artery becomes small distally but is patent. 4.  On the right side, the common femoral and deep femoral artery are patent.  There is moderate diffuse disease throughout the proximal superficial femoral artery down to the mid thigh where there is a short segment occlusion.  Below that the superficial femoral artery and popliteal artery are patent.  The single-vessel runoff on the right via the peroneal artery.  There is an occluded anterior tibial and posterior tibial artery.  There is disease in the tibial peroneal trunk and also in the proximal peroneal artery. CLINICAL NOTE: There are no further options for revascularization on the left.  If the wound progresses he would require a below the knee amputation. Deitra Mayo, MD, FACS [facility] DATE OF DICTATION:   04/02/2019    Patient benefited maximally from their hospital stay and there were no complications.     Disposition: Discharge disposition: 01-Home or Self Care      Discharge Instructions    Call MD / Call  911   Complete by: As directed    If you experience chest pain or shortness of breath, CALL 911 and be transported to the hospital emergency room.  If you develope a fever above 101 F, pus (white drainage) or increased drainage or redness at the wound, or calf pain, call your surgeon's office.   Call MD / Call 911   Complete by: As directed    If you experience chest pain or shortness of breath, CALL 911 and be transported to the hospital emergency room.  If you develope a fever above 101 F, pus (white drainage) or increased drainage or redness at the wound, or calf pain, call your surgeon's office.   Constipation Prevention   Complete by: As directed    Drink plenty of fluids.  Prune juice may be helpful.  You may use a stool softener, such as Colace (over the counter) 100 mg twice a day.  Use MiraLax (over the counter) for constipation as needed.   Constipation Prevention   Complete by: As directed    Drink plenty of fluids.  Prune juice may be helpful.  You may use a stool softener, such as Colace (over the counter) 100 mg twice a day.  Use MiraLax (over the counter) for constipation as needed.   Diet - low sodium heart healthy   Complete by: As directed    Diet - low sodium heart healthy   Complete by: As directed    Discharge instructions   Complete by: As directed    Keep vac in place. Follow up 5 days for vac removel   Increase activity slowly as tolerated   Complete by: As directed    Increase activity slowly as tolerated   Complete by: As directed    Negative Pressure Wound Therapy - Incisional   Complete by: As directed    Show patient how to attach prevena pump      Contact information for follow-up providers    Sherrina Zaugg, Bevely Palmer, PA In 1 week.   Specialty: Orthopedic Surgery Contact information: [address] [place] [address] [phone]        Suzan Slick, NP Follow up in 5 day(s).   Specialty: Orthopedic Surgery Why: Follow up Wed call for  appointment Contact information: [place] [address] [phone]            Contact information for after-discharge care  Destination    [facility] Preferred SNF .   Service: Skilled Nursing Contact information: [address] [place] [phone]                   Signed: Bevely Palmer Zonya Gudger 04/30/2019, 9:40 AM

## 2019-04-30 NOTE — Progress Notes (Signed)
Patient is lying in bed and appears comfortable.  0 cc in the wound VAC canister.  Patient's family is requesting skilled nursing placement.  Patient may discharge when insurance approval and bed available.  Will place pain medication order on patient's physical chart so it will be ready when patient discharges

## 2019-04-30 NOTE — Progress Notes (Signed)
Physical Therapy Treatment Patient Details Name: Billy Torres MRN: [id number] DOB: [birth year] Today's Date: 04/30/2019    History of Present Illness this 64 y.o. male admitted with nectrotic gangrenous ulcer on dorsal aspect Lt foot.  He underwent Lt BKA 4/21.  PMH includes:  h.o Lt MCA strok, CKD, h/o cognitive deficit due to CVA, HTN,  PAD, DM     PT Comments    Pt agreeable to therapy with encouragement. L residual limb pain remains a limitation. Requiring moderate assist (+2 safety) for bed mobility and transitions to standing with use of walker. Pt received saturated in urine, so provided gown/linen/brief change. Plan to d/c to SNF today. Would benefit from continued strengthening, transfer training, and amputee education.     Follow Up Recommendations  SNF;Supervision/Assistance - 24 hour     Equipment Recommendations  Other (comment)(defer)    Recommendations for Other Services       Precautions / Restrictions Precautions Precautions: Fall Required Braces or Orthoses: Other Brace Other Brace: L limb protector Restrictions Weight Bearing Restrictions: Yes LLE Weight Bearing: Non weight bearing    Mobility  Bed Mobility Overal bed mobility: Needs Assistance Bed Mobility: Supine to Sit;Sit to Supine     Supine to sit: Mod assist Sit to supine: Min guard   General bed mobility comments: Pt initiating well, requiring assist with bed pad to scoot hips forward to edge of bed. No assist required to return to supine.  Transfers Overall transfer level: Needs assistance Equipment used: Rolling walker (2 wheeled) Transfers: Sit to/from Stand Sit to Stand: Mod assist;+2 safety/equipment         General transfer comment: ModA (+2 safety) to rise from edge of bed x 2. Fatigues easily and cannot maintain static standing > 30 seconds.  Ambulation/Gait                 Stairs             Wheelchair Mobility    Modified Rankin (Stroke Patients  Only)       Balance Overall balance assessment: Needs assistance Sitting-balance support: Feet supported;No upper extremity supported Sitting balance-Leahy Scale: Poor Sitting balance - Comments: Posterior lean, requiring up to modA to maintain upright with no UE support   Standing balance support: Bilateral upper extremity supported Standing balance-Leahy Scale: Poor Standing balance comment: reliant on external support                            Cognition Arousal/Alertness: Awake/alert Behavior During Therapy: Flat affect Overall Cognitive Status: History of cognitive impairments - at baseline Area of Impairment: Attention;Memory;Following commands;Safety/judgement;Awareness;Problem solving                   Current Attention Level: Sustained Memory: Decreased short-term memory Following Commands: Follows one step commands inconsistently;Follows one step commands with increased time Safety/Judgement: Decreased awareness of safety Awareness: Intellectual Problem Solving: Slow processing;Decreased initiation;Difficulty sequencing;Requires verbal cues;Requires tactile cues        Exercises Amputee Exercises Quad Sets: Left;10 reps;Supine Knee Extension: Left;10 reps;Seated    General Comments        Pertinent Vitals/Pain Pain Assessment: Faces Faces Pain Scale: Hurts even more Pain Location: L residual limb Pain Descriptors / Indicators: Operative site guarding;Grimacing Pain Intervention(s): Limited activity within patient's tolerance;Monitored during session;Patient requesting pain meds-RN notified    Home Living  Prior Function            PT Goals (current goals can now be found in the care plan section) Acute Rehab PT Goals Patient Stated Goal: to be able to fish again  Potential to Achieve Goals: Fair Progress towards PT goals: Progressing toward goals    Frequency    Min 3X/week      PT Plan  Current plan remains appropriate    Co-evaluation              AM-PAC PT "6 Clicks" Mobility   Outcome Measure  Help needed turning from your back to your side while in a flat bed without using bedrails?: A Little Help needed moving from lying on your back to sitting on the side of a flat bed without using bedrails?: A Lot Help needed moving to and from a bed to a chair (including a wheelchair)?: A Lot Help needed standing up from a chair using your arms (e.g., wheelchair or bedside chair)?: A Lot Help needed to walk in hospital room?: Total Help needed climbing 3-5 steps with a railing? : Total 6 Click Score: 11    End of Session Equipment Utilized During Treatment: Gait belt Activity Tolerance: Patient tolerated treatment well Patient left: in bed;with call bell/phone within reach;with bed alarm set Nurse Communication: Mobility status;Patient requests pain meds PT Visit Diagnosis: Pain;Other abnormalities of gait and mobility (R26.89) Pain - Right/Left: Left Pain - part of body: (residual limb)     Time: [id number] PT Time Calculation (min) (ACUTE ONLY): 16 min  Charges:  $Therapeutic Activity: 8-22 mins                       Wyona Almas, PT, DPT Acute Rehabilitation Services Pager [phone] Office [phone]    [name] 04/30/2019, 2:57 PM

## 2019-04-30 NOTE — TOC Transition Note (Signed)
Transition of Care [facility]) - CM/SW Discharge Note   Patient Details  Name: Billy Torres MRN: [id number] Date of Birth: 12-27-1955  [facility]) CM/SW Contact:  Sharin Mons, RN Phone Number: [phone] 04/30/2019, 12:21 PM   Clinical Narrative:     Patient will DC to: [facility] Anticipated DC date: 04/30/2019 Family notified:   [name] (son) [phone] [name] [phone], [phone] (cell)    Transport by: [employer]   Per MD patient ready for DC today . RN, patient, patient's family, and facility notified of DC. Discharge Summary and FL2 sent to facility. RN to call report prior to discharge [phone]). DC packet on chart. [facility]  transport requested for patient. [facility] will arrive to pick pt up @ 1430. NCM made nurse aware.  RNCM will sign off for now as intervention is no longer needed. Please consult [place] again if new needs arise.    Final next level of care: Skilled Nursing Facility Barriers to Discharge: No Barriers Identified   Patient Goals and CMS Choice        Discharge Placement     Discharge Plan and Services     Social Determinants of Health (SDOH) Interventions     Readmission Risk Interventions No flowsheet data found.

## 2019-05-03 ENCOUNTER — Encounter: Payer: Self-pay | Admitting: Anesthesiology

## 2019-05-03 MED ORDER — ROPIVACAINE HCL 7.5 MG/ML IJ SOLN
INTRAMUSCULAR | Status: DC | PRN
  Administered 2019-04-28: 15 mL via PERINEURAL

## 2019-05-03 MED ORDER — BUPIVACAINE-EPINEPHRINE (PF) 0.5% -1:200000 IJ SOLN
INTRAMUSCULAR | Status: DC | PRN
  Administered 2019-04-28: 20 mL via PERINEURAL

## 2019-05-03 MED ORDER — LIDOCAINE-EPINEPHRINE (PF) 1.5 %-1:200000 IJ SOLN
INTRAMUSCULAR | Status: DC | PRN
  Administered 2019-04-28: 10 mL via PERINEURAL
  Administered 2019-04-28: 5 mL via PERINEURAL

## 2019-05-03 NOTE — Anesthesia Procedure Notes (Signed)
Anesthesia Regional Block: Femoral nerve block   Pre-Anesthetic Checklist: ,, timeout performed, Correct Patient, Correct Site, Correct Laterality, Correct Procedure, Correct Position, site marked, Risks and benefits discussed,  Surgical consent,  Pre-op evaluation,  At surgeon's request and post-op pain management  Laterality: Left and Lower  Prep: chloraprep       Needles:  Injection technique: Single-shot     Needle Length: 9cm  Needle Gauge: 22     Additional Needles: Arrow StimuQuik ECHO Echogenic Stimulating PNB Needle  Procedures:,,,, ultrasound used (permanent image in chart),,,,  Narrative:  Start time: 04/28/2019 11:28 AM End time: 04/28/2019 11:38 AM Injection made incrementally with aspirations every 5 mL.  Performed by: Personally  Anesthesiologist: Oleta Mouse, MD

## 2019-05-03 NOTE — Anesthesia Procedure Notes (Signed)
Anesthesia Regional Block: Popliteal block   Pre-Anesthetic Checklist: ,, timeout performed, Correct Patient, Correct Site, Correct Laterality, Correct Procedure, Correct Position, site marked, Risks and benefits discussed,  Surgical consent,  Pre-op evaluation,  At surgeon's request and post-op pain management  Laterality: Left and Lower  Prep: chloraprep       Needles:  Injection technique: Single-shot     Needle Length: 9cm  Needle Gauge: 22     Additional Needles: Arrow StimuQuik ECHO Echogenic Stimulating PNB Needle  Procedures:,,,, ultrasound used (permanent image in chart),,,,  Narrative:  Start time: 04/28/2019 11:28 AM End time: 04/28/2019 11:38 AM Injection made incrementally with aspirations every 5 mL.  Performed by: Personally  Anesthesiologist: Oleta Mouse, MD

## 2019-05-03 NOTE — Anesthesia Postprocedure Evaluation (Signed)
Anesthesia Post Note  Patient: Billy Torres  Procedure(s) Performed: LEFT BELOW KNEE AMPUTATION (Left Knee)     Patient location during evaluation: PACU Anesthesia Type: Regional and MAC Level of consciousness: awake and alert Pain management: pain level controlled Vital Signs Assessment: post-procedure vital signs reviewed and stable Respiratory status: spontaneous breathing, nonlabored ventilation, respiratory function stable and patient connected to nasal cannula oxygen Cardiovascular status: stable and blood pressure returned to baseline Postop Assessment: no apparent nausea or vomiting Anesthetic complications: no    Last Vitals:  Vitals:   04/30/19 0444 04/30/19 0756  BP: (!) 142/80 (!) 145/87  Pulse: 96 (!) 105  Resp: 15 17  Temp: 37.3 C 36.9 C  SpO2: 96% 99%    Last Pain:  Vitals:   04/30/19 1205  TempSrc:   PainSc: 9                  Elaria Osias

## 2019-05-05 ENCOUNTER — Encounter (HOSPITAL_COMMUNITY): Payer: Medicare (Managed Care)

## 2019-05-05 ENCOUNTER — Ambulatory Visit: Payer: Medicare (Managed Care) | Admitting: Vascular Surgery

## 2019-05-05 ENCOUNTER — Encounter: Payer: Self-pay | Admitting: Family

## 2019-05-05 ENCOUNTER — Other Ambulatory Visit: Payer: Self-pay

## 2019-05-05 ENCOUNTER — Ambulatory Visit (INDEPENDENT_AMBULATORY_CARE_PROVIDER_SITE_OTHER): Payer: Medicare (Managed Care) | Admitting: Family

## 2019-05-05 DIAGNOSIS — Z89512 Acquired absence of left leg below knee: Secondary | ICD-10-CM

## 2019-05-05 NOTE — Progress Notes (Signed)
Post-Op Visit Note   Patient: Billy Torres           Date of Birth: 08/27/1955           MRN: [id number] Visit Date: 05/05/2019 PCP: Angelica Pou, MD  Chief Complaint: No chief complaint on file.   HPI:  HPI The patient is a 64 year old gentleman seen today status post left below knee amputation.  He is 1 week out.  Wound VAC removed today.  Ortho Exam Incision well approximated staples there is no gaping no drainage no surrounding erythema there is very minimal swelling  Visit Diagnoses: No diagnosis found.  Plan: Begin daily Dial soap cleansing.  Dry dressing changes.  Continue shrinker around-the-clock.  Follow-up in 2 weeks for suture removal  Follow-Up Instructions: No follow-ups on file.   Imaging: No results found.  Orders:  No orders of the defined types were placed in this encounter.  No orders of the defined types were placed in this encounter.    PMFS History: Patient Active Problem List   Diagnosis Date Noted  . Acute osteomyelitis of left foot ([place]) 04/28/2019  . Cutaneous abscess of left foot   . PVD (peripheral vascular disease) ([place]) 12/11/2018  . Hydronephrosis due to obstruction of bladder   . Acute urinary retention 08/15/2016  . Malignant neoplasm of prostate ([place]) 02/26/2016  . Muscle spasticity   . Dementia ([place])   . Cerebral infarction due to unspecified mechanism   . Leukocytosis   . Acute thrombotic stroke ([place]) 05/17/2015  . Diabetes mellitus due to underlying condition with complication, without long-term current use of insulin ([place])   . Gait disturbance, post-stroke   . Hemiparesis ([place])   . Cognitive deficit due to old cerebral infarction   . Benign essential HTN   . Diabetes mellitus type 2 in nonobese (HCC)   . Essential hypertension   . History of CVA (cerebrovascular accident) without residual deficits   . Ataxia, post-stroke   . Dysarthria, post-stroke   . Tobacco abuse   . HLD (hyperlipidemia)   . Acute CVA  (cerebrovascular accident) ([place]) 05/15/2015  . Left-sided weakness 05/14/2015  . Right shoulder pain 03/03/2014  . History of CVA (cerebrovascular accident) 02/28/2014  . Smoking 06/29/2013  . CVA (cerebral infarction) 06/29/2013  . Dyslipidemia 06/29/2013  . TIA (transient ischemic attack) 06/27/2013  . Diabetes ([place]) 06/27/2013   Past Medical History:  Diagnosis Date  . Abnormal rate of speech    post stroke  . Anticoagulant long-term use    plavix  . Arthritis of left shoulder region   . At high risk for falls   . Chronic constipation   . Chronic kidney disease    chronic kidney disease   . Cognitive deficit due to old cerebral infarction   . Constipation   . DDD (degenerative disc disease), cervical   . Degenerative cervical spinal stenosis   . Dysphagia   . Frequency of urination   . Gait disturbance, post-stroke    uses cane  . History of ischemic left MCA stroke 06/27/2013   w/ evidence hemorrhagia, high grade stenosis bilateral proximal MCA and extensive small matter disease--- symptoms resolved  . History of ischemic stroke without residual deficits 05-14-2015---  residual left hemiparesis and cognitive deficits   small nonhemorrhegic infarct right corona radiata posterior limb and progressive right m2 stenosis  . Hyperlipidemia   . Hypertension   . Indwelling urethral catheter present   . Insomnia   .  Internal hemorrhoids   . Left hemiparesis (HCC)    RESIDUAL FROM STROKE--  WALKS W/ CANE  . Mild atherosclerosis of carotid artery, right    RICA 0-39% per duplex 06-28-2013  . Night sweats   . OA (osteoarthritis)    LEFT SHOULDER  . Painful bladder spasm   . Peripheral arterial disease ([place])   . Prostate cancer [facility]) urologist-  dr Gaynelle Arabian  oncologist-  dr [name]   dx 12/ 2017-- Stage T1c, Gleason 3+4,  PSA 10.4,  vol 51cc  . Sensorineural hearing loss, bilateral   . Type 2 diabetes mellitus treated with insulin ([place])   . Urge incontinence    wears  depends  . Urinary retention   . Wears dentures     Family History  Problem Relation Age of Onset  . Hypertension Mother   . Hypertension Father   . Cancer Brother        prostate    Past Surgical History:  Procedure Laterality Date  . ABDOMINAL AORTOGRAM W/LOWER EXTREMITY N/A 12/11/2018   Procedure: ABDOMINAL AORTOGRAM W/LOWER EXTREMITY;  Surgeon: Angelia Mould, MD;  Location: [facility];  Service: Cardiovascular;  Laterality: N/A;  . ABDOMINAL AORTOGRAM W/LOWER EXTREMITY Bilateral 04/02/2019   Procedure: ABDOMINAL AORTOGRAM W/LOWER EXTREMITY;  Surgeon: Angelia Mould, MD;  Location: [facility];  Service: Cardiovascular;  Laterality: Bilateral;  . AMPUTATION Left 04/28/2019   Procedure: LEFT BELOW KNEE AMPUTATION;  Surgeon: Newt Minion, MD;  Location: [facility];  Service: Orthopedics;  Laterality: Left;  . ANTERIOR CERVICAL DECOMP/DISCECTOMY FUSION  11/27/1999   C5 -- C6  . BELOW KNEE LEG AMPUTATION Left 04/28/2019  . CIRCUMCISION  10/07/2001  . CYSTOSCOPY WITH INSERTION OF UROLIFT N/A 08/11/2017   Procedure: CYSTOSCOPY WITH INSERTION OF UROLIFT;  Surgeon: Cleon Gustin, MD;  Location: [facility];  Service: Urology;  Laterality: N/A;  . FOOT SURGERY  2005  . PERIPHERAL VASCULAR BALLOON ANGIOPLASTY  12/11/2018   Procedure: PERIPHERAL VASCULAR BALLOON ANGIOPLASTY;  Surgeon: Angelia Mould, MD;  Location: [facility];  Service: Cardiovascular;;  Lt. SFA  . PROSTATE BIOPSY  12/12/2015  . RADIOACTIVE SEED IMPLANT N/A 05/30/2016   Procedure: RADIOACTIVE SEED IMPLANT/BRACHYTHERAPY IMPLANT WITH SPACE OAR;  Surgeon: Carolan Clines, MD;  Location: [facility];  Service: Urology;  Laterality: N/A;  . SHOULDER ARTHROSCOPY WITH SUBACROMIAL DECOMPRESSION Left 11/28/2006   Debridement/ Acromioplasty/  CA ligament relesase  . TRANSTHORACIC ECHOCARDIOGRAM  05/15/2015   mild LVH,  ef 65-70%, codominant mitral inflow-iindeterminate  diastolic function/     Social History   Occupational History  . Not on file  Tobacco Use  . Smoking status: Current Some Day Smoker    Packs/day: 0.25    Years: 45.00    Pack years: 11.25    Types: Cigarettes  . Smokeless tobacco: Never Used  . Tobacco comment: continues to smoke approx. 1 to 2 cig's per month  Substance and Sexual Activity  . Alcohol use: No  . Drug use: No  . Sexual activity: Yes

## 2019-05-06 ENCOUNTER — Inpatient Hospital Stay: Payer: Medicare (Managed Care) | Admitting: Orthopedic Surgery

## 2019-05-19 ENCOUNTER — Other Ambulatory Visit: Payer: Self-pay

## 2019-05-19 ENCOUNTER — Ambulatory Visit (INDEPENDENT_AMBULATORY_CARE_PROVIDER_SITE_OTHER): Payer: Medicare (Managed Care) | Admitting: Family

## 2019-05-19 ENCOUNTER — Encounter: Payer: Self-pay | Admitting: Family

## 2019-05-19 DIAGNOSIS — Z89512 Acquired absence of left leg below knee: Secondary | ICD-10-CM

## 2019-05-19 NOTE — Progress Notes (Signed)
Post-Op Visit Note   Patient: Billy Torres           Date of Birth: Jul 14, 1955           MRN: [id number] Visit Date: 05/19/2019 PCP: Angelica Pou, MD  Chief Complaint: No chief complaint on file.   HPI:  HPI The patient is a 64 year old gentleman seen today status post left below knee amputation.  He is 3 weeks out.  Presents today in a wheelchair [facility]  Ortho Exam Incision well approximated with staples. there is no gaping no drainage no surrounding erythema. there is very minimal swelling  Visit Diagnoses: No diagnosis found.  Plan: Staples harvested today. Continue daily cleansing continue shrinker with direct skin contact.  Provide an order today to [employer] for his prosthesis set up.  He will follow-up in the office in 3 weeks.   Follow-Up Instructions: No follow-ups on file.   Imaging: No results found.  Orders:  No orders of the defined types were placed in this encounter.  No orders of the defined types were placed in this encounter.    PMFS History: Patient Active Problem List   Diagnosis Date Noted  . Acquired absence of left leg below knee ([place]) 05/05/2019  . Acute osteomyelitis of left foot ([place]) 04/28/2019  . Cutaneous abscess of left foot   . PVD (peripheral vascular disease) ([place]) 12/11/2018  . Hydronephrosis due to obstruction of bladder   . Acute urinary retention 08/15/2016  . Malignant neoplasm of prostate ([place]) 02/26/2016  . Muscle spasticity   . Dementia ([place])   . Cerebral infarction due to unspecified mechanism   . Leukocytosis   . Acute thrombotic stroke ([place]) 05/17/2015  . Diabetes mellitus due to underlying condition with complication, without long-term current use of insulin ([place])   . Gait disturbance, post-stroke   . Hemiparesis ([place])   . Cognitive deficit due to old cerebral infarction   . Benign essential HTN   . Diabetes mellitus type 2 in nonobese (HCC)   . Essential hypertension   . History of CVA  (cerebrovascular accident) without residual deficits   . Ataxia, post-stroke   . Dysarthria, post-stroke   . Tobacco abuse   . HLD (hyperlipidemia)   . Acute CVA (cerebrovascular accident) ([place]) 05/15/2015  . Left-sided weakness 05/14/2015  . Right shoulder pain 03/03/2014  . History of CVA (cerebrovascular accident) 02/28/2014  . Smoking 06/29/2013  . CVA (cerebral infarction) 06/29/2013  . Dyslipidemia 06/29/2013  . TIA (transient ischemic attack) 06/27/2013  . Diabetes ([place]) 06/27/2013   Past Medical History:  Diagnosis Date  . Abnormal rate of speech    post stroke  . Anticoagulant long-term use    plavix  . Arthritis of left shoulder region   . At high risk for falls   . Chronic constipation   . Chronic kidney disease    chronic kidney disease   . Cognitive deficit due to old cerebral infarction   . Constipation   . DDD (degenerative disc disease), cervical   . Degenerative cervical spinal stenosis   . Dysphagia   . Frequency of urination   . Gait disturbance, post-stroke    uses cane  . History of ischemic left MCA stroke 06/27/2013   w/ evidence hemorrhagia, high grade stenosis bilateral proximal MCA and extensive small matter disease--- symptoms resolved  . History of ischemic stroke without residual deficits 05-14-2015---  residual left hemiparesis and cognitive deficits   small nonhemorrhegic  infarct right corona radiata posterior limb and progressive right m2 stenosis  . Hyperlipidemia   . Hypertension   . Indwelling urethral catheter present   . Insomnia   . Internal hemorrhoids   . Left hemiparesis (HCC)    RESIDUAL FROM STROKE--  WALKS W/ CANE  . Mild atherosclerosis of carotid artery, right    RICA 0-39% per duplex 06-28-2013  . Night sweats   . OA (osteoarthritis)    LEFT SHOULDER  . Painful bladder spasm   . Peripheral arterial disease ([place])   . Prostate cancer [facility]) urologist-  dr Gaynelle Arabian  oncologist-  dr [name]   dx 12/ 2017-- Stage T1c,  Gleason 3+4,  PSA 10.4,  vol 51cc  . Sensorineural hearing loss, bilateral   . Type 2 diabetes mellitus treated with insulin ([place])   . Urge incontinence    wears depends  . Urinary retention   . Wears dentures     Family History  Problem Relation Age of Onset  . Hypertension Mother   . Hypertension Father   . Cancer Brother        prostate    Past Surgical History:  Procedure Laterality Date  . ABDOMINAL AORTOGRAM W/LOWER EXTREMITY N/A 12/11/2018   Procedure: ABDOMINAL AORTOGRAM W/LOWER EXTREMITY;  Surgeon: Angelia Mould, MD;  Location: [facility];  Service: Cardiovascular;  Laterality: N/A;  . ABDOMINAL AORTOGRAM W/LOWER EXTREMITY Bilateral 04/02/2019   Procedure: ABDOMINAL AORTOGRAM W/LOWER EXTREMITY;  Surgeon: Angelia Mould, MD;  Location: [facility];  Service: Cardiovascular;  Laterality: Bilateral;  . AMPUTATION Left 04/28/2019   Procedure: LEFT BELOW KNEE AMPUTATION;  Surgeon: Newt Minion, MD;  Location: [facility];  Service: Orthopedics;  Laterality: Left;  . ANTERIOR CERVICAL DECOMP/DISCECTOMY FUSION  11/27/1999   C5 -- C6  . BELOW KNEE LEG AMPUTATION Left 04/28/2019  . CIRCUMCISION  10/07/2001  . CYSTOSCOPY WITH INSERTION OF UROLIFT N/A 08/11/2017   Procedure: CYSTOSCOPY WITH INSERTION OF UROLIFT;  Surgeon: Cleon Gustin, MD;  [facility];  Service: Urology;  Laterality: N/A;  . FOOT SURGERY  2005  . PERIPHERAL VASCULAR BALLOON ANGIOPLASTY  12/11/2018   Procedure: PERIPHERAL VASCULAR BALLOON ANGIOPLASTY;  Surgeon: Angelia Mould, MD;  Location: [facility];  Service: Cardiovascular;;  Lt. SFA  . PROSTATE BIOPSY  12/12/2015  . RADIOACTIVE SEED IMPLANT N/A 05/30/2016   Procedure: RADIOACTIVE SEED IMPLANT/BRACHYTHERAPY IMPLANT WITH SPACE OAR;  Surgeon: Carolan Clines, MD;  Location: [facility];  Service: Urology;  Laterality: N/A;  . SHOULDER ARTHROSCOPY WITH SUBACROMIAL DECOMPRESSION Left 11/28/2006    Debridement/ Acromioplasty/  CA ligament relesase  . TRANSTHORACIC ECHOCARDIOGRAM  05/15/2015   mild LVH,  ef 65-70%, codominant mitral inflow-iindeterminate diastolic function/     Social History   Occupational History  . Not on file  Tobacco Use  . Smoking status: Current Some Day Smoker    Packs/day: 0.25    Years: 45.00    Pack years: 11.25    Types: Cigarettes  . Smokeless tobacco: Never Used  . Tobacco comment: continues to smoke approx. 1 to 2 cig's per month  Substance and Sexual Activity  . Alcohol use: No  . Drug use: No  . Sexual activity: Yes

## 2019-05-20 ENCOUNTER — Encounter (HOSPITAL_COMMUNITY): Payer: Self-pay | Admitting: Physician Assistant

## 2019-05-20 ENCOUNTER — Inpatient Hospital Stay (HOSPITAL_COMMUNITY)
Admission: EM | Admit: 2019-05-20 | Discharge: 2019-05-28 | DRG: 393 | Disposition: A | Discharge status: Skilled Nursing Facility | Payer: Medicare (Managed Care) | Source: Skilled Nursing Facility | Attending: Internal Medicine | Admitting: Internal Medicine

## 2019-05-20 ENCOUNTER — Other Ambulatory Visit: Payer: Self-pay

## 2019-05-20 ENCOUNTER — Inpatient Hospital Stay (HOSPITAL_COMMUNITY): Payer: Medicare (Managed Care)

## 2019-05-20 ENCOUNTER — Emergency Department (HOSPITAL_COMMUNITY): Payer: Medicare (Managed Care)

## 2019-05-20 DIAGNOSIS — E1151 Type 2 diabetes mellitus with diabetic peripheral angiopathy without gangrene: Secondary | ICD-10-CM | POA: Diagnosis present

## 2019-05-20 DIAGNOSIS — R000 Tachycardia, unspecified: Secondary | ICD-10-CM | POA: Diagnosis not present

## 2019-05-20 DIAGNOSIS — I69354 Hemiplegia and hemiparesis following cerebral infarction affecting left non-dominant side: Secondary | ICD-10-CM | POA: Diagnosis not present

## 2019-05-20 DIAGNOSIS — L89151 Pressure ulcer of sacral region, stage 1: Secondary | ICD-10-CM | POA: Diagnosis present

## 2019-05-20 DIAGNOSIS — E1122 Type 2 diabetes mellitus with diabetic chronic kidney disease: Secondary | ICD-10-CM | POA: Diagnosis present

## 2019-05-20 DIAGNOSIS — E46 Unspecified protein-calorie malnutrition: Secondary | ICD-10-CM | POA: Diagnosis present

## 2019-05-20 DIAGNOSIS — G8220 Paraplegia, unspecified: Secondary | ICD-10-CM | POA: Diagnosis present

## 2019-05-20 DIAGNOSIS — N179 Acute kidney failure, unspecified: Secondary | ICD-10-CM | POA: Diagnosis present

## 2019-05-20 DIAGNOSIS — Z8546 Personal history of malignant neoplasm of prostate: Secondary | ICD-10-CM | POA: Diagnosis not present

## 2019-05-20 DIAGNOSIS — K625 Hemorrhage of anus and rectum: Secondary | ICD-10-CM

## 2019-05-20 DIAGNOSIS — Z8249 Family history of ischemic heart disease and other diseases of the circulatory system: Secondary | ICD-10-CM

## 2019-05-20 DIAGNOSIS — Z20822 Contact with and (suspected) exposure to covid-19: Secondary | ICD-10-CM | POA: Diagnosis present

## 2019-05-20 DIAGNOSIS — I6521 Occlusion and stenosis of right carotid artery: Secondary | ICD-10-CM | POA: Diagnosis present

## 2019-05-20 DIAGNOSIS — K922 Gastrointestinal hemorrhage, unspecified: Secondary | ICD-10-CM | POA: Diagnosis present

## 2019-05-20 DIAGNOSIS — I69320 Aphasia following cerebral infarction: Secondary | ICD-10-CM

## 2019-05-20 DIAGNOSIS — Z681 Body mass index (BMI) 19 or less, adult: Secondary | ICD-10-CM | POA: Diagnosis not present

## 2019-05-20 DIAGNOSIS — E785 Hyperlipidemia, unspecified: Secondary | ICD-10-CM | POA: Diagnosis present

## 2019-05-20 DIAGNOSIS — K635 Polyp of colon: Secondary | ICD-10-CM | POA: Diagnosis not present

## 2019-05-20 DIAGNOSIS — R531 Weakness: Secondary | ICD-10-CM

## 2019-05-20 DIAGNOSIS — K626 Ulcer of anus and rectum: Principal | ICD-10-CM | POA: Diagnosis present

## 2019-05-20 DIAGNOSIS — I739 Peripheral vascular disease, unspecified: Secondary | ICD-10-CM | POA: Diagnosis not present

## 2019-05-20 DIAGNOSIS — R571 Hypovolemic shock: Secondary | ICD-10-CM | POA: Diagnosis present

## 2019-05-20 DIAGNOSIS — N189 Chronic kidney disease, unspecified: Secondary | ICD-10-CM | POA: Diagnosis present

## 2019-05-20 DIAGNOSIS — D62 Acute posthemorrhagic anemia: Secondary | ICD-10-CM | POA: Diagnosis present

## 2019-05-20 DIAGNOSIS — Z7982 Long term (current) use of aspirin: Secondary | ICD-10-CM

## 2019-05-20 DIAGNOSIS — Z981 Arthrodesis status: Secondary | ICD-10-CM

## 2019-05-20 DIAGNOSIS — K921 Melena: Secondary | ICD-10-CM | POA: Diagnosis present

## 2019-05-20 DIAGNOSIS — Z7401 Bed confinement status: Secondary | ICD-10-CM

## 2019-05-20 DIAGNOSIS — E440 Moderate protein-calorie malnutrition: Secondary | ICD-10-CM | POA: Diagnosis present

## 2019-05-20 DIAGNOSIS — K621 Rectal polyp: Secondary | ICD-10-CM | POA: Diagnosis present

## 2019-05-20 DIAGNOSIS — I129 Hypertensive chronic kidney disease with stage 1 through stage 4 chronic kidney disease, or unspecified chronic kidney disease: Secondary | ICD-10-CM | POA: Diagnosis present

## 2019-05-20 DIAGNOSIS — Z89512 Acquired absence of left leg below knee: Secondary | ICD-10-CM | POA: Diagnosis not present

## 2019-05-20 DIAGNOSIS — D120 Benign neoplasm of cecum: Secondary | ICD-10-CM | POA: Diagnosis present

## 2019-05-20 DIAGNOSIS — H903 Sensorineural hearing loss, bilateral: Secondary | ICD-10-CM | POA: Diagnosis present

## 2019-05-20 DIAGNOSIS — Z7984 Long term (current) use of oral hypoglycemic drugs: Secondary | ICD-10-CM

## 2019-05-20 DIAGNOSIS — Z87891 Personal history of nicotine dependence: Secondary | ICD-10-CM

## 2019-05-20 DIAGNOSIS — Z79899 Other long term (current) drug therapy: Secondary | ICD-10-CM

## 2019-05-20 DIAGNOSIS — M19012 Primary osteoarthritis, left shoulder: Secondary | ICD-10-CM | POA: Diagnosis present

## 2019-05-20 DIAGNOSIS — I2510 Atherosclerotic heart disease of native coronary artery without angina pectoris: Secondary | ICD-10-CM | POA: Diagnosis present

## 2019-05-20 DIAGNOSIS — R578 Other shock: Secondary | ICD-10-CM | POA: Diagnosis present

## 2019-05-20 DIAGNOSIS — I700 Atherosclerosis of aorta: Secondary | ICD-10-CM | POA: Diagnosis present

## 2019-05-20 DIAGNOSIS — Z79891 Long term (current) use of opiate analgesic: Secondary | ICD-10-CM

## 2019-05-20 DIAGNOSIS — L89896 Pressure-induced deep tissue damage of other site: Secondary | ICD-10-CM | POA: Diagnosis present

## 2019-05-20 DIAGNOSIS — Z7902 Long term (current) use of antithrombotics/antiplatelets: Secondary | ICD-10-CM

## 2019-05-20 DIAGNOSIS — L8990 Pressure ulcer of unspecified site, unspecified stage: Secondary | ICD-10-CM | POA: Insufficient documentation

## 2019-05-20 DIAGNOSIS — N281 Cyst of kidney, acquired: Secondary | ICD-10-CM | POA: Diagnosis present

## 2019-05-20 DIAGNOSIS — K5900 Constipation, unspecified: Secondary | ICD-10-CM | POA: Diagnosis present

## 2019-05-20 DIAGNOSIS — F0390 Unspecified dementia without behavioral disturbance: Secondary | ICD-10-CM | POA: Diagnosis present

## 2019-05-20 DIAGNOSIS — K5730 Diverticulosis of large intestine without perforation or abscess without bleeding: Secondary | ICD-10-CM | POA: Diagnosis present

## 2019-05-20 DIAGNOSIS — L89616 Pressure-induced deep tissue damage of right heel: Secondary | ICD-10-CM | POA: Diagnosis present

## 2019-05-20 HISTORY — DX: Gastrointestinal hemorrhage, unspecified: K92.2

## 2019-05-20 LAB — CBC WITH DIFFERENTIAL/PLATELET
Abs Immature Granulocytes: 0.05 10*3/uL (ref 0.00–0.07)
Basophils Absolute: 0 10*3/uL (ref 0.0–0.1)
Basophils Relative: 0 %
Eosinophils Absolute: 0.1 10*3/uL (ref 0.0–0.5)
Eosinophils Relative: 1 %
HCT: 34 % — ABNORMAL LOW (ref 39.0–52.0)
Hemoglobin: 10.7 g/dL — ABNORMAL LOW (ref 13.0–17.0)
Immature Granulocytes: 1 %
Lymphocytes Relative: 19 %
Lymphs Abs: 1.8 10*3/uL (ref 0.7–4.0)
MCH: 28.6 pg (ref 26.0–34.0)
MCHC: 31.5 g/dL (ref 30.0–36.0)
MCV: 90.9 fL (ref 80.0–100.0)
Monocytes Absolute: 0.6 10*3/uL (ref 0.1–1.0)
Monocytes Relative: 7 %
Neutro Abs: 6.7 10*3/uL (ref 1.7–7.7)
Neutrophils Relative %: 72 %
Platelets: 457 10*3/uL — ABNORMAL HIGH (ref 150–400)
RBC: 3.74 MIL/uL — ABNORMAL LOW (ref 4.22–5.81)
RDW: 12.4 % (ref 11.5–15.5)
WBC: 9.2 10*3/uL (ref 4.0–10.5)
nRBC: 0 % (ref 0.0–0.2)

## 2019-05-20 LAB — CBC
HCT: 33.8 % — ABNORMAL LOW (ref 39.0–52.0)
Hemoglobin: 10.8 g/dL — ABNORMAL LOW (ref 13.0–17.0)
MCH: 27.3 pg (ref 26.0–34.0)
MCHC: 32 g/dL (ref 30.0–36.0)
MCV: 85.4 fL (ref 80.0–100.0)
Platelets: 359 10*3/uL (ref 150–400)
RBC: 3.96 MIL/uL — ABNORMAL LOW (ref 4.22–5.81)
RDW: 15.5 % (ref 11.5–15.5)
WBC: 11.3 10*3/uL — ABNORMAL HIGH (ref 4.0–10.5)
nRBC: 0 % (ref 0.0–0.2)

## 2019-05-20 LAB — PROTIME-INR
INR: 1 (ref 0.8–1.2)
Prothrombin Time: 12.3 seconds (ref 11.4–15.2)

## 2019-05-20 LAB — PREPARE PLATELET PHERESIS
Unit division: 0
Unit division: 0

## 2019-05-20 LAB — BPAM PLATELET PHERESIS
Blood Product Expiration Date: 202105142359
Blood Product Expiration Date: 202105152359
ISSUE DATE / TIME: 202105131015
Unit Type and Rh: 5100
Unit Type and Rh: 6200

## 2019-05-20 LAB — COMPREHENSIVE METABOLIC PANEL
ALT: 11 U/L (ref 0–44)
AST: 13 U/L — ABNORMAL LOW (ref 15–41)
Albumin: 3 g/dL — ABNORMAL LOW (ref 3.5–5.0)
Alkaline Phosphatase: 53 U/L (ref 38–126)
Anion gap: 13 (ref 5–15)
BUN: 34 mg/dL — ABNORMAL HIGH (ref 8–23)
CO2: 21 mmol/L — ABNORMAL LOW (ref 22–32)
Calcium: 9.1 mg/dL (ref 8.9–10.3)
Chloride: 101 mmol/L (ref 98–111)
Creatinine, Ser: 1.4 mg/dL — ABNORMAL HIGH (ref 0.61–1.24)
GFR calc Af Amer: >60 mL/min (ref >60)
GFR calc non Af Amer: 52 mL/min — ABNORMAL LOW (ref >60)
Glucose, Bld: 119 mg/dL — ABNORMAL HIGH (ref 70–99)
Potassium: 5.1 mmol/L (ref 3.5–5.1)
Sodium: 135 mmol/L (ref 135–145)
Total Bilirubin: 0.8 mg/dL (ref 0.3–1.2)
Total Protein: 6.4 g/dL — ABNORMAL LOW (ref 6.5–8.1)

## 2019-05-20 LAB — GLUCOSE, CAPILLARY
Glucose-Capillary: 94 mg/dL (ref 70–99)
Glucose-Capillary: 92 mg/dL (ref 70–99)
Glucose-Capillary: 86 mg/dL (ref 70–99)

## 2019-05-20 LAB — CBG MONITORING, ED: Glucose-Capillary: 160 mg/dL — ABNORMAL HIGH (ref 70–99)

## 2019-05-20 LAB — PREPARE RBC (CROSSMATCH)

## 2019-05-20 LAB — SARS CORONAVIRUS 2 BY RT PCR (HOSPITAL ORDER, PERFORMED IN ~~LOC~~ HOSPITAL LAB): SARS Coronavirus 2: NEGATIVE

## 2019-05-20 LAB — HEMOGLOBIN A1C
Hgb A1c MFr Bld: 6.1 % — ABNORMAL HIGH (ref 4.8–5.6)
Mean Plasma Glucose: 128.37 mg/dL

## 2019-05-20 LAB — ABO/RH: ABO/RH(D): O POS

## 2019-05-20 LAB — HIV ANTIBODY (ROUTINE TESTING W REFLEX): HIV Screen 4th Generation wRfx: NONREACTIVE

## 2019-05-20 MED ORDER — TRAZODONE HCL 50 MG PO TABS: 100.0000 mg | ORAL_TABLET | Freq: Every day | ORAL | Status: DC

## 2019-05-20 MED ORDER — SENNA 8.6 MG PO TABS: 2.0000 | ORAL_TABLET | Freq: Every day | ORAL | Status: DC

## 2019-05-20 MED ORDER — LORATADINE 10 MG PO TABS: 10.0000 mg | ORAL_TABLET | Freq: Every day | ORAL | Status: DC

## 2019-05-20 MED ORDER — ATORVASTATIN CALCIUM 10 MG PO TABS: 20.0000 mg | ORAL_TABLET | Freq: Every day | ORAL | Status: DC

## 2019-05-20 MED ORDER — LACTATED RINGERS IV SOLN: INTRAVENOUS | Status: DC

## 2019-05-20 MED ORDER — SODIUM CHLORIDE 0.9 % IV BOLUS
500.0000 mL | Freq: Once | INTRAVENOUS | Status: AC
  Administered 2019-05-20: 500 mL via INTRAVENOUS

## 2019-05-20 MED ORDER — INSULIN ASPART 100 UNIT/ML ~~LOC~~ SOLN: 0.0000 [IU] | Freq: Every day | SUBCUTANEOUS | Status: DC

## 2019-05-20 MED ORDER — INSULIN ASPART 100 UNIT/ML ~~LOC~~ SOLN: 0.0000 [IU] | Freq: Three times a day (TID) | SUBCUTANEOUS | Status: DC

## 2019-05-20 MED ORDER — ACETAMINOPHEN 325 MG PO TABS: 650.0000 mg | ORAL_TABLET | Freq: Four times a day (QID) | ORAL | Status: DC | PRN

## 2019-05-20 MED ORDER — PEG-KCL-NACL-NASULF-NA ASC-C 100 G PO SOLR
1.0000 | Freq: Once | ORAL | Status: AC
  Administered 2019-05-20: 200 g via ORAL
  Filled 2019-05-20: qty 1

## 2019-05-20 MED ORDER — DONEPEZIL HCL 10 MG PO TABS
10.0000 mg | ORAL_TABLET | Freq: Every day | ORAL | Status: DC
  Filled 2019-05-20: qty 1

## 2019-05-20 MED ORDER — ONDANSETRON HCL 4 MG/2ML IJ SOLN
4.0000 mg | Freq: Once | INTRAMUSCULAR | Status: AC
  Administered 2019-05-20: 4 mg via INTRAVENOUS
  Filled 2019-05-20: qty 2

## 2019-05-20 MED ORDER — SODIUM CHLORIDE 0.9% IV SOLUTION: Freq: Once | INTRAVENOUS | Status: DC

## 2019-05-20 MED ORDER — ONDANSETRON HCL 4 MG/2ML IJ SOLN: 4.0000 mg | Freq: Four times a day (QID) | INTRAMUSCULAR | Status: DC | PRN

## 2019-05-20 MED ORDER — ONDANSETRON HCL 4 MG PO TABS: 4.0000 mg | ORAL_TABLET | Freq: Four times a day (QID) | ORAL | Status: DC | PRN

## 2019-05-20 MED ORDER — CHLORHEXIDINE GLUCONATE CLOTH 2 % EX PADS
6.0000 | MEDICATED_PAD | Freq: Every day | CUTANEOUS | Status: DC
  Administered 2019-05-23 – 2019-05-28 (×6): 6 via TOPICAL

## 2019-05-20 MED ORDER — PANTOPRAZOLE SODIUM 40 MG IV SOLR
40.0000 mg | Freq: Once | INTRAVENOUS | Status: AC
  Administered 2019-05-20: 40 mg via INTRAVENOUS
  Filled 2019-05-20: qty 40

## 2019-05-20 MED ORDER — LACTATED RINGERS IV BOLUS: 1000.0000 mL | Freq: Once | INTRAVENOUS | Status: AC

## 2019-05-20 MED ORDER — DOCUSATE SODIUM 100 MG PO CAPS: 100.0000 mg | ORAL_CAPSULE | Freq: Two times a day (BID) | ORAL | Status: DC | PRN

## 2019-05-20 MED ORDER — INSULIN ASPART 100 UNIT/ML ~~LOC~~ SOLN
0.0000 [IU] | Freq: Three times a day (TID) | SUBCUTANEOUS | Status: DC
  Administered 2019-05-21: 2 [IU] via SUBCUTANEOUS
  Administered 2019-05-22: 3 [IU] via SUBCUTANEOUS
  Administered 2019-05-22 (×2): 1 [IU] via SUBCUTANEOUS
  Administered 2019-05-23: 2 [IU] via SUBCUTANEOUS
  Administered 2019-05-24: 3 [IU] via SUBCUTANEOUS
  Administered 2019-05-24 (×2): 1 [IU] via SUBCUTANEOUS
  Administered 2019-05-27: 2 [IU] via SUBCUTANEOUS
  Administered 2019-05-27 – 2019-05-28 (×2): 3 [IU] via SUBCUTANEOUS

## 2019-05-20 MED ORDER — PANTOPRAZOLE SODIUM 40 MG IV SOLR
40.0000 mg | INTRAVENOUS | Status: DC
  Administered 2019-05-20: 40 mg via INTRAVENOUS
  Filled 2019-05-20: qty 40

## 2019-05-20 MED ORDER — ALFUZOSIN HCL ER 10 MG PO TB24
10.0000 mg | ORAL_TABLET | Freq: Every day | ORAL | Status: DC
  Filled 2019-05-20: qty 1

## 2019-05-20 MED ORDER — LACTATED RINGERS IV BOLUS
1000.0000 mL | Freq: Once | INTRAVENOUS | Status: AC
  Administered 2019-05-20: 1000 mL via INTRAVENOUS

## 2019-05-20 MED ORDER — OXYCODONE HCL 5 MG PO TABS: 5.0000 mg | ORAL_TABLET | ORAL | Status: DC | PRN

## 2019-05-20 MED ORDER — POLYETHYLENE GLYCOL 3350 17 G PO PACK: 17.0000 g | PACK | Freq: Every day | ORAL | Status: DC | PRN

## 2019-05-20 MED ORDER — INSULIN ASPART 100 UNIT/ML ~~LOC~~ SOLN
0.0000 [IU] | Freq: Every day | SUBCUTANEOUS | Status: DC
  Administered 2019-05-21 – 2019-05-23 (×2): 2 [IU] via SUBCUTANEOUS

## 2019-05-20 MED ORDER — IOHEXOL 350 MG/ML SOLN
100.0000 mL | Freq: Once | INTRAVENOUS | Status: AC | PRN
  Administered 2019-05-20: 100 mL via INTRAVENOUS

## 2019-05-20 MED ORDER — POLYETHYLENE GLYCOL 3350 17 G PO PACK: 17.0000 g | PACK | Freq: Every day | ORAL | Status: DC

## 2019-05-20 MED ORDER — ACETAMINOPHEN 650 MG RE SUPP: 650.0000 mg | Freq: Four times a day (QID) | RECTAL | Status: DC | PRN

## 2019-05-20 NOTE — H&P (View-Only) (Signed)
Patient transferred to ICU for hypotension in setting of LGI bleeding.  CTA report just released - no extravasation seen.  Large amount of rectal stool (images reviewed) Raises concern for stercoral ulcer bleeding.  Oral bowel prep ordered for probable sigmoidoscopy/colonoscopy in AM.  Dr. Rush Landmark (ovn coverage for [facility]) aware in case urgent scope needed overnight.

## 2019-05-20 NOTE — H&P (Signed)
Date: 05/20/2019               Patient Name:  Billy Torres MRN: [id number]  DOB: 1955/04/27 Age / Sex: 64 y.o., male   PCP: Angelica Pou, MD         Medical Service: Internal Medicine Teaching Service         Attending Physician: Dr. Sid Falcon, MD    First Contact: Dr. Charleen Kirks Pager: [phone]  Second Contact: Dr. Sherry Ruffing Pager: [phone]       After Hours (After 5p/  First Contact Pager: [phone]  weekends / holidays): Second Contact Pager: [phone]   Chief Complaint: Bloody bowel movement  History of Present Illness: Billy Torres is a 64 year old male with hemiplegia s/p CVA, dementia, HTN, DM, prostate cancer, urinary retention, and HLD presented to the emergency department after having a bloodied bowel movement. History was obtained via the patient and through chart review.  Patient was hospitalized on 04/28/19 to 04/30/19 with gangrene of the left foot. He subsequently underwent left below the knee amputation on 4/21. He was discharged to [facility] for further rehabilitation. He been doing well until this morning when he got up to have a bowel movement and noted blood with several clots. He was subsequently brought to the emergency department for further evaluation. Since getting to the emergency department he has had one further bowel movement. He is never had anything like this before in the past. He is not having any abdominal pain. He does not use excessive NSAIDs or alcohol. His last colonoscopy was in 2007 that showed a polyp was subsequently resected. He has no family history of colon cancer. He has had no changes in his regular bowel movements.  Review of systems is negative for fevers, chills, nausea/vomiting, abdominal pain, shortness of breath, chest pain, myalgias, arthralgias, new rash, excessive weight loss. Review of systems is positive for decreased appetite over the last 4 to 6 weeks.  Meds:  Current Meds  Medication Sig  . acetaminophen  (TYLENOL) 325 MG tablet Take 1-2 tablets (325-650 mg total) by mouth every 6 (six) hours as needed for mild pain (pain score 1-3 or temp > 100.5).  Marland Kitchen alfuzosin (UROXATRAL) 10 MG 24 hr tablet Take 10 mg by mouth daily with breakfast.  . amLODipine (NORVASC) 5 MG tablet Take 5 mg by mouth daily.  Marland Kitchen aspirin EC 81 MG tablet Take 81 mg by mouth daily.  Marland Kitchen atorvastatin (LIPITOR) 20 MG tablet Take 20 mg by mouth daily.   . clopidogrel (PLAVIX) 75 MG tablet Take 1 tablet (75 mg total) by mouth daily with breakfast.  . donepezil (ARICEPT) 10 MG tablet Take 10 mg by mouth at bedtime.  . fexofenadine (ALLEGRA) 60 MG tablet Take 60 mg by mouth 2 (two) times daily as needed for allergies or rhinitis.  Marland Kitchen losartan (COZAAR) 50 MG tablet Take 50 mg by mouth daily.   . metFORMIN (GLUCOPHAGE) 500 MG tablet Take 500 mg by mouth 2 (two) times daily with a meal.   . Nutritional Supplements (ENSURE ENLIVE PO) Take 8 oz by mouth daily.  Marland Kitchen oxyCODONE (OXY IR/ROXICODONE) 5 MG immediate release tablet Take 1 tablet (5 mg total) by mouth every 4 (four) hours as needed for severe pain.  . polyethylene glycol (MIRALAX / GLYCOLAX) 17 g packet Take 17 g by mouth daily.  Marland Kitchen senna (SENOKOT) 8.6 MG TABS tablet Take 2 tablets by mouth at bedtime.  . sildenafil (VIAGRA) 25 MG  tablet Take 25 mg by mouth daily as needed for erectile dysfunction.  . traZODone (DESYREL) 100 MG tablet Take 100 mg by mouth at bedtime.   Allergies: Allergies as of 05/20/2019  . (No Known Allergies)   Past Medical History:  Diagnosis Date  . Abnormal rate of speech    post stroke  . Anticoagulant long-term use    plavix  . Arthritis of left shoulder region   . Chronic constipation   . Chronic kidney disease    chronic kidney disease   . Cognitive deficit due to old cerebral infarction   . DDD (degenerative disc disease), cervical   . Degenerative cervical spinal stenosis   . Dysphagia   . Gait disturbance, post-stroke    uses cane  . History  of ischemic left MCA stroke 06/27/2013   w/ evidence hemorrhagia, high grade stenosis bilateral proximal MCA and extensive small matter disease--- symptoms resolved  . History of ischemic stroke without residual deficits 05-14-2015---  residual left hemiparesis and cognitive deficits   small nonhemorrhegic infarct right corona radiata posterior limb and progressive right m2 stenosis  . Hyperlipidemia   . Hypertension   . Indwelling urethral catheter present   . Insomnia   . Internal hemorrhoids   . Left hemiparesis (HCC)    RESIDUAL FROM STROKE--  WALKS W/ CANE  . Mild atherosclerosis of carotid artery, right    RICA 0-39% per duplex 06-28-2013  . OA (osteoarthritis)    LEFT SHOULDER  . Painful bladder spasm   . Peripheral arterial disease ([place])   . Prostate cancer [facility]) urologist-  dr Gaynelle Arabian  oncologist-  dr [name]   dx 12/ 2017-- Stage T1c, Gleason 3+4,  PSA 10.4,  vol 51cc  . Sensorineural hearing loss, bilateral   . Type 2 diabetes mellitus treated with insulin ([place])   . Urge incontinence    w frequency, wears depends  . Urinary retention   . Wears dentures    Family History: Mother had heart disease. Otherwise denies a family history of hypertension, diabetes, colon cancer, breast cancer, ovarian cancer, uterine cancer.  Social History: Patient is currently residing at [facility] for rehab. He has two sons that live in the [place] area. He denies the use of tobacco, alcohol, or illicit substances.  Review of Systems: A complete ROS was negative except as per HPI.  Physical Exam: Blood pressure 112/60, pulse (!) 111, temperature 98.6 F (37 C), temperature source Oral, resp. rate 18, height 5\' 8"  (1.727 m), weight 59.9 kg, SpO2 98 %.  General: Well nourished male in no acute distress HENT: Normocephalic, atraumatic, moist mucus membranes Pulm: Good air movement with no wheezing or crackles  CV: RRR, no murmurs, no rubs  Abdomen: Active bowel sounds, soft,  non-distended, no tenderness to palpation  Extremities: Left below the knee amputation, RLE without edema  Skin: Warm and dry  Neuro: Alert and oriented x 3  EKG: personally reviewed my interpretation is sinus rhythm  CXR: personally reviewed my interpretation is unremarkable.  Assessment & Plan by Problem: Active Problems:   Acute lower GI bleeding  Billy Torres is a 64 year old male with hemiplegia s/p CVA, dementia, HTN, DM, prostate cancer, urinary retention, and HLD presented to the emergency department after having a bloodied bowel movement. He is hemodynamically stable but admission was requested for further evaluation.   Acute lower GI bleed - Patient is currently hemodynamically stable  - Suspect diverticular given history  - No need for PPI currently  -  Maintain 2 large bore IVs  - Start LR 100 cc/hr  - Type and screen  - Appreciate GI recs  AKI  - Creatinine elevated to 1.40 from baseline of 1  - Given history of prostate cancer and urinary retention we will obtain a renal ultrasound and PVR  - Check UA  - Start LR 100cc/hr   Hemiplegia s/p CVA PAD Hyperlipidemia - Continue atorvastatin  - Hold ASA and Plvix for now  HTN  - Hold losartan in setting of AKI  - Hold amlodipine as BP is currently soft and in setting of GI bleed  DM  - On metformin as outpatient  - Hold in the setting of AKI  - Start SSI   Diet: Clear liquids  VTE ppx: None (no anticoagulation in setting of bleed and no SCDs with severe PAD) CODE STATUS: Full Code  Dispo: Admit patient to Observation with expected length of stay less than 2 midnights.  SignedIna Homes, MD 05/20/2019, 11:20 AM  Pager: [phone]

## 2019-05-20 NOTE — ED Notes (Signed)
Patient changed multiple times copious amount of bright red blood with clots noted. Patient also vomit large amount of green bile. MD made aware.

## 2019-05-20 NOTE — ED Notes (Signed)
Attempted report.  Pt still in CT at this time

## 2019-05-20 NOTE — Progress Notes (Signed)
Paged for continued rectal bleeding. Patient has become progressively hypotensive. Presented the bedside for further evaluation. He states that he is tired and cold. Since we talked this morning he has had two episodes of bright red blood. He feels okay right now. He is tachycardic and borderline hypotensive.   EDP has already ordered three units of packed red cells. Will order some FFP and platelets. STAT CTA of the abdomen and pelvis to determine whether he is a candidate for IR intervention. Will also give 1 L bolus of LR. Patient has also lost his second IV access. Please replace and maintain two large bore peripheral IV's.  Ina Homes, MD

## 2019-05-20 NOTE — ED Notes (Signed)
Pt to CT at this time.

## 2019-05-20 NOTE — ED Triage Notes (Signed)
Pt arrived via [place], from [facility]. Per EMS pt was having rectal bleeding and staff stated he had bleed through one brief at [place]. EMS stated when they got there at 7am his brief was full again with bright red blood.  Per EMS pt. Baseline was a little confusion. Patient had a recent left BKA 3 weeks ago and was there for rehab. 20g IV to left hand that had infused 50cc of NS.

## 2019-05-20 NOTE — ED Notes (Signed)
GI doctor at bedside. Patient is alert oriented , states he is feeling better at present.

## 2019-05-20 NOTE — ED Provider Notes (Addendum)
[facility] Provider Note   CSN: [id number] Arrival date & time: 05/20/19  [id number]     History Chief Complaint  Patient presents with  . Rectal Bleeding    [facility] is a 64 y.o. male.  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  He presents today from the rehab facility with reports that he has rectal bleeding.  They reported he had a diaper with blood clots and had several episodes of passing blood clots per rectum.  Patient reports the same.  He denies any weakness or lightheadedness.  Prehospital is reported that patient has some baseline confusion.  EMS reported bright red blood noted on their arrival.  He received IV saline 50 cc.  Patient denies any syncope, chest pain, dyspnea, abdominal pain, history of GI bleeding.  Per his records he is on Plavix.  Papers from [facility] rehab facility indicate patient is full code     Past Medical History:  Diagnosis Date  . Abnormal rate of speech    post stroke  . Anticoagulant long-term use    plavix  . Arthritis of left shoulder region   . At high risk for falls   . Chronic constipation   . Chronic kidney disease    chronic kidney disease   . Cognitive deficit due to old cerebral infarction   . Constipation   . DDD (degenerative disc disease), cervical   . Degenerative cervical spinal stenosis   . Dysphagia   . Frequency of urination   . Gait disturbance, post-stroke    uses cane  . History of ischemic left MCA stroke 06/27/2013   w/ evidence hemorrhagia, high grade stenosis bilateral proximal MCA and extensive small matter disease--- symptoms resolved  . History of ischemic stroke without residual deficits 05-14-2015---  residual left hemiparesis and cognitive deficits   small nonhemorrhegic infarct right corona radiata posterior limb and progressive right m2 stenosis  .  Hyperlipidemia   . Hypertension   . Indwelling urethral catheter present   . Insomnia   . Internal hemorrhoids   . Left hemiparesis (HCC)    RESIDUAL FROM STROKE--  WALKS W/ CANE  . Mild atherosclerosis of carotid artery, right    RICA 0-39% per duplex 06-28-2013  . Night sweats   . OA (osteoarthritis)    LEFT SHOULDER  . Painful bladder spasm   . Peripheral arterial disease ([place])   . Prostate cancer [facility]) urologist-  dr Gaynelle Arabian  oncologist-  dr [name]   dx 12/ 2017-- Stage T1c, Gleason 3+4,  PSA 10.4,  vol 51cc  . Sensorineural hearing loss, bilateral   . Type 2 diabetes mellitus treated with insulin ([place])   . Urge incontinence    wears depends  . Urinary retention   . Wears dentures     Patient Active Problem List   Diagnosis Date Noted  . Acquired absence of left leg below knee ([place]) 05/05/2019  . Acute osteomyelitis of left foot ([place]) 04/28/2019  . Cutaneous abscess of left foot   . PVD (peripheral vascular disease) ([place]) 12/11/2018  . Hydronephrosis due to obstruction of bladder   . Acute urinary retention 08/15/2016  . Malignant neoplasm of prostate ([place]) 02/26/2016  . Muscle spasticity   . Dementia ([place])   . Cerebral infarction due to unspecified mechanism   . Leukocytosis   . Acute thrombotic stroke ([place])  05/17/2015  . Diabetes mellitus due to underlying condition with complication, without long-term current use of insulin ([place])   . Gait disturbance, post-stroke   . Hemiparesis ([place])   . Cognitive deficit due to old cerebral infarction   . Benign essential HTN   . Diabetes mellitus type 2 in nonobese (HCC)   . Essential hypertension   . History of CVA (cerebrovascular accident) without residual deficits   . Ataxia, post-stroke   . Dysarthria, post-stroke   . Tobacco abuse   . HLD (hyperlipidemia)   . Acute CVA (cerebrovascular accident) ([place]) 05/15/2015  . Left-sided weakness 05/14/2015  . Right shoulder pain 03/03/2014  . History of CVA  (cerebrovascular accident) 02/28/2014  . Smoking 06/29/2013  . CVA (cerebral infarction) 06/29/2013  . Dyslipidemia 06/29/2013  . TIA (transient ischemic attack) 06/27/2013  . Diabetes ([place]) 06/27/2013    Past Surgical History:  Procedure Laterality Date  . ABDOMINAL AORTOGRAM W/LOWER EXTREMITY N/A 12/11/2018   Procedure: ABDOMINAL AORTOGRAM W/LOWER EXTREMITY;  Surgeon: Angelia Mould, MD;  Location: [place] [facility];  Service: Cardiovascular;  Laterality: N/A;  . ABDOMINAL AORTOGRAM W/LOWER EXTREMITY Bilateral 04/02/2019   Procedure: ABDOMINAL AORTOGRAM W/LOWER EXTREMITY;  Surgeon: Angelia Mould, MD;  Location: [place] [facility];  Service: Cardiovascular;  Laterality: Bilateral;  . AMPUTATION Left 04/28/2019   Procedure: LEFT BELOW KNEE AMPUTATION;  Surgeon: Newt Minion, MD;  Location: [place];  Service: Orthopedics;  Laterality: Left;  . ANTERIOR CERVICAL DECOMP/DISCECTOMY FUSION  11/27/1999   C5 -- C6  . BELOW KNEE LEG AMPUTATION Left 04/28/2019  . CIRCUMCISION  10/07/2001  . CYSTOSCOPY WITH INSERTION OF UROLIFT N/A 08/11/2017   Procedure: CYSTOSCOPY WITH INSERTION OF UROLIFT;  Surgeon: Cleon Gustin, MD;  Location: [facility];  Service: Urology;  Laterality: N/A;  . FOOT SURGERY  2005  . PERIPHERAL VASCULAR BALLOON ANGIOPLASTY  12/11/2018   Procedure: PERIPHERAL VASCULAR BALLOON ANGIOPLASTY;  Surgeon: Angelia Mould, MD;  Location: [place] [facility];  Service: Cardiovascular;;  Lt. SFA  . PROSTATE BIOPSY  12/12/2015  . RADIOACTIVE SEED IMPLANT N/A 05/30/2016   Procedure: RADIOACTIVE SEED IMPLANT/BRACHYTHERAPY IMPLANT WITH SPACE OAR;  Surgeon: Carolan Clines, MD;  Location[facility];  Service: Urology;  Laterality: N/A;  . SHOULDER ARTHROSCOPY WITH SUBACROMIAL DECOMPRESSION Left 11/28/2006   Debridement/ Acromioplasty/  CA ligament relesase  . TRANSTHORACIC ECHOCARDIOGRAM  05/15/2015   mild LVH,  ef 65-70%, codominant mitral  inflow-iindeterminate diastolic function/         Family History  Problem Relation Age of Onset  . Hypertension Mother   . Hypertension Father   . Cancer Brother        prostate    Social History   Tobacco Use  . Smoking status: Current Some Day Smoker    Packs/day: 0.25    Years: 45.00    Pack years: 11.25    Types: Cigarettes  . Smokeless tobacco: Never Used  . Tobacco comment: continues to smoke approx. 1 to 2 cig's per month  Substance Use Topics  . Alcohol use: No  . Drug use: No    Home Medications Prior to Admission medications   Medication Sig Start Date End Date Taking? Authorizing Provider  acetaminophen (TYLENOL) 325 MG tablet Take 1-2 tablets (325-650 mg total) by mouth every 6 (six) hours as needed for mild pain (pain score 1-3 or temp > 100.5). 04/30/19   Persons, Bevely Palmer, PA  alfuzosin (UROXATRAL) 10 MG 24 hr tablet Take  10 mg by mouth daily with breakfast.    [provider]  amLODipine (NORVASC) 5 MG tablet Take 5 mg by mouth daily.    [provider]  aspirin EC 81 MG tablet Take 81 mg by mouth daily.    [provider]  atorvastatin (LIPITOR) 20 MG tablet Take 20 mg by mouth daily.     [provider]  clopidogrel (PLAVIX) 75 MG tablet Take 1 tablet (75 mg total) by mouth daily with breakfast. 12/12/18   Rhyne, Samantha J, PA-C  donepezil (ARICEPT) 10 MG tablet Take 10 mg by mouth at bedtime.    [provider]  fexofenadine (ALLEGRA) 60 MG tablet Take 60 mg by mouth 2 (two) times daily as needed for allergies or rhinitis.    [provider]  gabapentin (NEURONTIN) 400 MG capsule Take 2 capsules (800 mg total) by mouth at bedtime. 04/30/19   Persons, Bevely Palmer, PA  losartan (COZAAR) 50 MG tablet Take 50 mg by mouth daily.     [provider]  metFORMIN (GLUCOPHAGE) 500 MG tablet Take 500 mg by mouth 2 (two) times daily with a meal.     [provider]  oxyCODONE (OXY IR/ROXICODONE) 5 MG  immediate release tablet Take 1 tablet (5 mg total) by mouth every 4 (four) hours as needed for severe pain. 04/30/19   Persons, Bevely Palmer, PA  oxyCODONE-acetaminophen (PERCOCET) 10-325 MG tablet Take 1 tablet by mouth every 4 (four) hours as needed for pain.    [provider]  sildenafil (VIAGRA) 25 MG tablet Take 25 mg by mouth daily as needed for erectile dysfunction.    [provider]  traZODone (DESYREL) 100 MG tablet Take 100 mg by mouth at bedtime.    [provider]    Allergies    Patient has no known allergies.  Review of Systems   Review of Systems  Skin: Positive for wound.  All other systems reviewed and are negative.   Physical Exam Updated Vital Signs SpO2 98%   Physical Exam Vitals and nursing note reviewed.  Constitutional:      General: He is not in acute distress.    Appearance: He is not ill-appearing.  HENT:     Head: Normocephalic and atraumatic.     Right Ear: External ear normal.     Left Ear: External ear normal.     Nose: Nose normal.     Mouth/Throat:     Mouth: Mucous membranes are moist.  Eyes:     Pupils: Pupils are equal, round, and reactive to light.  Cardiovascular:     Rate and Rhythm: Tachycardia present.     Heart sounds: Normal heart sounds.     Comments: Patient with decreased pulses right foot Left BKA Radial pulses are 2+ bilaterally Pulmonary:     Effort: Pulmonary effort is normal.     Breath sounds: Normal breath sounds.  Abdominal:     General: Abdomen is flat.     Palpations: Abdomen is soft.  Genitourinary:    Comments: Diaper with maroon to brighter blood noted with maroon stool in rectum Musculoskeletal:        General: No swelling. Normal range of motion.     Cervical back: Normal range of motion.  Skin:    General: Skin is warm and dry.     Capillary Refill: Capillary refill takes less than 2 seconds.  Neurological:     General: No focal deficit present.  Mental Status: He is alert.      ED Results / Procedures / Treatments   Labs (all labs ordered are listed, but only abnormal results are displayed) Labs Reviewed  COMPREHENSIVE METABOLIC PANEL  CBC WITH DIFFERENTIAL/PLATELET  PROTIME-INR  TYPE AND SCREEN    EKG EKG Interpretation  Date/Time:  Thursday May 20 2019 09:08:43 EDT Ventricular Rate:  93 PR Interval:    QRS Duration: 84 QT Interval:  329 QTC Calculation: 410 R Axis:   33 Text Interpretation: Normal sinus rhythm Normal ECG Confirmed by Pattricia Boss [phone]) on 05/20/2019 10:53:47 AM   Radiology No results found.  Procedures .Critical Care Performed by: Pattricia Boss, MD Authorized by: Pattricia Boss, MD   Critical care provider statement:    Critical care time (minutes):  45   Critical care end time:  05/20/2019 10:54 AM   Critical care was time spent personally by me on the following activities:  Discussions with consultants, evaluation of patient's response to treatment, examination of patient, ordering and performing treatments and interventions, ordering and review of laboratory studies, ordering and review of radiographic studies, pulse oximetry, re-evaluation of patient's condition, obtaining history from patient or surrogate and review of old charts   (including critical care time)  Medications Ordered in ED Medications  pantoprazole (PROTONIX) injection 40 mg (has no administration in time range)  sodium chloride 0.9 % bolus 500 mL (has no administration in time range)    ED Course  I have reviewed the triage vital signs and the nursing notes.  Pertinent labs & imaging results that were available during my care of the patient were reviewed by me and considered in my medical decision making (see chart for details).  Clinical Course as of May 19 1025  Thu May 20, 2019  1026 Mild anemia   Hemoglobin(!): 10.7 [DR]    Clinical Course User Index [DR] Pattricia Boss, MD   MDM Rules/Calculators/A&P                     Patient  presents today with rectal bleeding.  Patient on plavix.  No abdominal pain co or tenderness on exam.  Recommend with no wounds clots noted Vitals:   05/20/19 0854  SpO2: 98%   Rectal bleeding- on plavix.  Borderline hypotension with normal hr. Hgb- Patient typed and screened Consultation with GI pending Patient's pmd is [facility] and will admit to medicine Full code Discussed with Azucena Freed, PA on-call for GI and will see in consult Healing left bka Wound right toe and right heel Discussed with Dr. Tarri Abernethy, on-call for internal medicine teaching service and he will see and evaluate for admission 11:39 AM Called to bedside as patient now with bright red blood per rectum.  He is nauseated and vomiting.  Blood pressure is now low at 77/43.  We are initiating fluid resuscitation with a liter of normal saline.  I am transfusing 3 units packed red blood cells.  And will treat with Zofran for nausea.  There is no evidence of blood in emesis. 12:19 PM Discussed with Nancy Marus, GI is aware of increased rectal bleeding and will decide on further more urgent interventions Final Clinical Impression(s) / ED Diagnoses Final diagnoses:  Weakness  Rectal bleeding    Rx / DC Orders ED Discharge Orders    None       Pattricia Boss, MD 05/20/19 1054    Pattricia Boss, MD 05/22/19 1053

## 2019-05-20 NOTE — H&P (Signed)
NAME:  Billy Torres, MRN:  [id number], DOB:  [birth year], LOS: 0 ADMISSION DATE:  05/20/2019, CONSULTATION DATE: May 20, 2019 REFERRING MD: Dr. Tarri Abernethy, CHIEF COMPLAINT:  GI bleeding  Brief History   64 year old male admitted from the [place] [facility] emergency department on May 20, 2019 in the setting of the rapid lower GI bleed.  History of present illness   64 year old male who was recently discharged from this facility after a below the knee amputation in April.  He has an extensive past medical history including a left-sided MCA stroke in 2015 and lives in a nursing home.  In April 2021 he was noted to have gangrene of his left foot.  He was admitted to [facility] underwent amputation.  He was discharged to a nursing facility.  He also has a past medical history significant for dementia, hypertension, diabetes and prostate cancer among other things.  He was unable to provide history because of aphasia.  He denies any sort of pain on my exam.  Has had multiple bloody bowel movements in the emergency room and despite fluid resuscitation and blood product administration with blood pressure was in the systolic [id number] so pulmonary critical care medicine was consulted for further evaluation.  GI medicine is seen the patient and recommended a CT angiogram of his abdomen and possible IR embolization if a source of bleeding could be localized.  Past Medical History  Left MCA stroke June 2015 with residual deficits Right nonhemorrhagic infarct corona radiata 2017 Hypertension Left hemiparesis Chronic kidney disease, severity unspecified Dementia Degenerative disc disease Osteoarthritis Prostate cancer Type 2 diabetes  Significant Hospital Events   5/13 admission  Consults:  GI IR  Procedures:    Significant Diagnostic Tests:  5/13 CT angiogram abdomen>   Micro Data:  5/13 SARS CoV 2 > negative  Antimicrobials:     Interim history/subjective:    Objective   Blood pressure  (!) 82/55, pulse 82, temperature 98.7 F (37.1 C), temperature source Oral, resp. rate 15, height 5\' 8"  (1.727 m), weight 59.9 kg, SpO2 99 %.        Intake/Output Summary (Last 24 hours) at 05/20/2019 1548 Last data filed at 05/20/2019 1043 Gross per 24 hour  Intake 550 ml  Output --  Net 550 ml   Filed Weights   05/20/19 0857  Weight: 59.9 kg    Examination:  General:  Resting comfortably in bed HENT: NCAT OP clear PULM: CTA B, normal effort CV: RRR, no mgr GI: BS+, soft, nontender MSK: R AKA, R muscle atrophy Neuro: sleepy but will wwake up and nod head appropriately to questions   Resolved Hospital Problem list     Assessment & Plan:  Acute lower GI bleed: Admit to ICU NPO Continue transfusion orders for remaining packed red blood cells, FFP, and platelets as ordered by internal medicine/emergency room Repeat CBC at 7 PM Plan for CT angiogram abdomen I have notified interventional radiology of the plans for CT Angiogram of abdomen so they can respond with embolization if a bleeding source is identified 2 large bore IVs Continue crystalloid infusion> LR at 75 cc/hr Hold ASA/Plavix PPI IV to continue (though strongly suspect this is lower GI Bleed)  Hemorrhagic shock: mental status OK, making urine Administer blood products as ordered Avoid vasopressor if able, volume resuscitate Bolus LR (1 Liter) now  DM2 SSI q4h  AKI Volume resuscitate LR 75cc/hr after bolus Monitor BMET and UOP Replace electrolytes as needed  History of stroke (bilateral) Aphasia  paraplegia Hold ASA/Plavix  BKA April 2021 Peripheral arterial disease Hypertension Hyperllipidemia Wound care per routine Restart home meds on 5/14 if stable  Best practice:  Diet: npo Pain/Anxiety/Delirium protocol (if indicated): n/a VAP protocol (if indicated): n/a DVT prophylaxis: SCD GI prophylaxis: PPI Glucose control: SSI Mobility: bed rest Code Status: full Family Communication:  called the patient's Son Awesome, could not get through, left a message; Number listed for Nuala Alpha is incorrect Disposition: admit to ICU  Labs   CBC: Recent Labs  Lab 05/20/19 0850  WBC 9.2  NEUTROABS 6.7  HGB 10.7*  HCT 34.0*  MCV 90.9  PLT 457*    Basic Metabolic Panel: Recent Labs  Lab 05/20/19 0850  NA 135  K 5.1  CL 101  CO2 21*  GLUCOSE 119*  BUN 34*  CREATININE 1.40*  CALCIUM 9.1   GFR: Estimated Creatinine Clearance: 44.6 mL/min (A) (by C-G formula based on SCr of 1.4 mg/dL (H)). Recent Labs  Lab 05/20/19 0850  WBC 9.2    Liver Function Tests: Recent Labs  Lab 05/20/19 0850  AST 13*  ALT 11  ALKPHOS 53  BILITOT 0.8  PROT 6.4*  ALBUMIN 3.0*   No results for input(s): LIPASE, AMYLASE in the last 168 hours. No results for input(s): AMMONIA in the last 168 hours.  ABG    Component Value Date/Time   TCO2 30 04/02/2019 0703     Coagulation Profile: Recent Labs  Lab 05/20/19 0850  INR 1.0    Cardiac Enzymes: No results for input(s): CKTOTAL, CKMB, CKMBINDEX, TROPONINI in the last 168 hours.  HbA1C: Hemoglobin A1C  Date/Time Value Ref Range Status  08/14/2015 04:08 PM 6.5  Final   Hgb A1c MFr Bld  Date/Time Value Ref Range Status  05/20/2019 12:01 PM 6.1 (H) 4.8 - 5.6 % Final    Comment:    (NOTE) Pre diabetes:          5.7%-6.4% Diabetes:              >6.4% Glycemic control for   <7.0% adults with diabetes   05/12/2015 09:50 AM 7.2 (H) <5.7 % Final    Comment:      For someone without known diabetes, a hemoglobin A1c value of 6.5% or greater indicates that they may have diabetes and this should be confirmed with a follow-up test.   For someone with known diabetes, a value <7% indicates that their diabetes is well controlled and a value greater than or equal to 7% indicates suboptimal control. A1c targets should be individualized based on duration of diabetes, age, comorbid conditions, and other considerations.    Currently, no consensus exists for use of hemoglobin A1c for diagnosis of diabetes for children.       CBG: Recent Labs  Lab 05/20/19 1147  GLUCAP 160*    Review of Systems:   Cannot obtain due to aphasia  Past Medical History  He,  has a past medical history of Abnormal rate of speech, Anticoagulant long-term use, Arthritis of left shoulder region, Chronic constipation, Chronic kidney disease, Cognitive deficit due to old cerebral infarction, DDD (degenerative disc disease), cervical, Degenerative cervical spinal stenosis, Dysphagia, Gait disturbance, post-stroke, History of ischemic left MCA stroke (06/27/2013), History of ischemic stroke without residual deficits (05-14-2015---  residual left hemiparesis and cognitive deficits), Hyperlipidemia, Hypertension, Insomnia, Internal hemorrhoids, Left hemiparesis (HCC), Mild atherosclerosis of carotid artery, right, OA (osteoarthritis), Painful bladder spasm, Peripheral arterial disease ([place]), Prostate cancer [facility]) (urologist-  dr Gaynelle Arabian  oncologist-  dr [name]), Sensorineural hearing loss, bilateral, Type 2 diabetes mellitus treated with insulin ([place]), Urge incontinence, Urinary retention, and Wears dentures.   Surgical History    Past Surgical History:  Procedure Laterality Date  . ABDOMINAL AORTOGRAM W/LOWER EXTREMITY N/A 12/11/2018   Procedure: ABDOMINAL AORTOGRAM W/LOWER EXTREMITY;  Surgeon: Angelia Mould, MD;  Location: [place] [facility];  Service: Cardiovascular;  Laterality: N/A;  . ABDOMINAL AORTOGRAM W/LOWER EXTREMITY Bilateral 04/02/2019   Procedure: ABDOMINAL AORTOGRAM W/LOWER EXTREMITY;  Surgeon: Angelia Mould, MD;  Location: [place] [facility];  Service: Cardiovascular;  Laterality: Bilateral;  . AMPUTATION Left 04/28/2019   Procedure: LEFT BELOW KNEE AMPUTATION;  Surgeon: Newt Minion, MD;  Location: [place];  Service: Orthopedics;  Laterality: Left;  . ANTERIOR CERVICAL DECOMP/DISCECTOMY FUSION   11/27/1999   C5 -- C6  . BELOW KNEE LEG AMPUTATION Left 04/28/2019  . CIRCUMCISION  10/07/2001  . CYSTOSCOPY WITH INSERTION OF UROLIFT N/A 08/11/2017   Procedure: CYSTOSCOPY WITH INSERTION OF UROLIFT;  Surgeon: Cleon Gustin, MD;  Location: [facility];  Service: Urology;  Laterality: N/A;  . FOOT SURGERY  2005  . PERIPHERAL VASCULAR BALLOON ANGIOPLASTY  12/11/2018   Procedure: PERIPHERAL VASCULAR BALLOON ANGIOPLASTY;  Surgeon: Angelia Mould, MD;  Location: [place] [facility];  Service: Cardiovascular;;  Lt. SFA  . PROSTATE BIOPSY  12/12/2015  . RADIOACTIVE SEED IMPLANT N/A 05/30/2016   Procedure: RADIOACTIVE SEED IMPLANT/BRACHYTHERAPY IMPLANT WITH SPACE OAR;  Surgeon: Carolan Clines, MD;  Location[facility];  Service: Urology;  Laterality: N/A;  . SHOULDER ARTHROSCOPY WITH SUBACROMIAL DECOMPRESSION Left 11/28/2006   Debridement/ Acromioplasty/  CA ligament relesase  . TRANSTHORACIC ECHOCARDIOGRAM  05/15/2015   mild LVH,  ef 65-70%, codominant mitral inflow-iindeterminate diastolic function/       Social History   reports that he has been smoking cigarettes. He has a 11.25 pack-year smoking history. He has never used smokeless tobacco. He reports that he does not drink alcohol or use drugs.   Family History   His family history includes Cancer in his brother; Hypertension in his father and mother.   Allergies No Known Allergies   Home Medications  Prior to Admission medications   Medication Sig Start Date End Date Taking? Authorizing Provider  acetaminophen (TYLENOL) 325 MG tablet Take 1-2 tablets (325-650 mg total) by mouth every 6 (six) hours as needed for mild pain (pain score 1-3 or temp > 100.5). 04/30/19  Yes Persons, Bevely Palmer, PA  alfuzosin (UROXATRAL) 10 MG 24 hr tablet Take 10 mg by mouth daily with breakfast.   Yes [provider]  amLODipine (NORVASC) 5 MG tablet Take 5 mg by mouth daily.   Yes [provider]  aspirin EC 81  MG tablet Take 81 mg by mouth daily.   Yes [provider]  atorvastatin (LIPITOR) 20 MG tablet Take 20 mg by mouth daily.    Yes [provider]  clopidogrel (PLAVIX) 75 MG tablet Take 1 tablet (75 mg total) by mouth daily with breakfast. 12/12/18  Yes Rhyne, Samantha J, PA-C  donepezil (ARICEPT) 10 MG tablet Take 10 mg by mouth at bedtime.   Yes [provider]  fexofenadine (ALLEGRA) 60 MG tablet Take 60 mg by mouth 2 (two) times daily as needed for allergies or rhinitis.   Yes [provider]  losartan (COZAAR) 50 MG tablet Take 50 mg by mouth daily.    Yes [provider]  metFORMIN (GLUCOPHAGE) 500 MG tablet  Take 500 mg by mouth 2 (two) times daily with a meal.    Yes [provider]  Nutritional Supplements (ENSURE ENLIVE PO) Take 8 oz by mouth daily.   Yes [provider]  oxyCODONE (OXY IR/ROXICODONE) 5 MG immediate release tablet Take 1 tablet (5 mg total) by mouth every 4 (four) hours as needed for severe pain. 04/30/19  Yes Persons, Bevely Palmer, PA  polyethylene glycol (MIRALAX / GLYCOLAX) 17 g packet Take 17 g by mouth daily.   Yes [provider]  senna (SENOKOT) 8.6 MG TABS tablet Take 2 tablets by mouth at bedtime.   Yes [provider]  sildenafil (VIAGRA) 25 MG tablet Take 25 mg by mouth daily as needed for erectile dysfunction.   Yes [provider]  traZODone (DESYREL) 100 MG tablet Take 100 mg by mouth at bedtime.   Yes [provider]  gabapentin (NEURONTIN) 400 MG capsule Take 2 capsules (800 mg total) by mouth at bedtime. Patient not taking: Reported on 05/20/2019 04/30/19   Persons, Bevely Palmer, [place]     Critical care time: 45 minutes     Roselie Awkward, MD [place] Pager: [phone] Cell: [phone] If no response, call [phone]

## 2019-05-20 NOTE — Consult Note (Addendum)
[facility] Consult: 10:33 AM 05/20/2019  LOS: 0 days    Referring Provider: Dr Jeanell Sparrow in ED  Primary Care Physician:  Angelica Pou, MD Primary Gastroenterologist:  Dr. Verl Blalock, scoped pt in 2007    Reason for Consultation: Large volume hematochezia.   HPI: Billy Torres is a 64 y.o. male.  Multitude of chronic, serious medical issues listed below.  Resident in SNF.  Chronic Plavix and 81 ASA. 2006 Colonoscopy.  Small right sided polyps, snare and cauterized, not retrieved.  3.5 cm sessile polyp at cecum piecemeal removed, retrieved.  5 mm sessile polyp at sigmoid removed, not retrieved.  Path cecal polyp: adenoma w HG dysplasia.   2007 Colonoscopy: 5 mm sessile polyp removed, retrieved from sigmoid.  Path: hyperplastic.   No fup colonoscopies or GI contact since 2007.  No records of previous blood transfusion  04/28/19-04/30/19 admission for L foot osteo, underwent 4/21 L AKA.  Discharged to SNF, had previously been living at home with his nephew in [place].  Non-bloody emesis yesterday.   In the wee hours this morning, developed hematochezia.  At least 2 episodes reported at SNF, noted first at 6 AM and then again at 7 AM.  No abdominal pain, no nausea now, no weakness, no dizziness, no chest pain, no dyspnea.  No previous history of major or minor bleeding per rectum.  Stools normally brown.  For a few to several months his appetite has been diminished along with early satiety.  Denies difficulty swallowing any textures.  No PPI or H2 blockers in use.    Now in ED w maroon stools, soft BPs 98/64 >> 1 teens/60s.  HR low 1 teens Hgb 10.7, was 12.9 on 4/21.  MCV 90. Platelets, WBCs ok.    INR 1.   MDs DRE revealed medium to darker red blood in the rectum.  Past Medical History:  Diagnosis Date    . Abnormal rate of speech    post stroke  . Anticoagulant long-term use    plavix  . Arthritis of left shoulder region   . At high risk for falls   . Chronic constipation   . Chronic kidney disease    chronic kidney disease   . Cognitive deficit due to old cerebral infarction   . Constipation   . DDD (degenerative disc disease), cervical   . Degenerative cervical spinal stenosis   . Dysphagia   . Frequency of urination   . Gait disturbance, post-stroke    uses cane  . History of ischemic left MCA stroke 06/27/2013   w/ evidence hemorrhagia, high grade stenosis bilateral proximal MCA and extensive small matter disease--- symptoms resolved  . History of ischemic stroke without residual deficits 05-14-2015---  residual left hemiparesis and cognitive deficits   small nonhemorrhegic infarct right corona radiata posterior limb and progressive right m2 stenosis  . Hyperlipidemia   . Hypertension   . Indwelling urethral catheter present   . Insomnia   . Internal hemorrhoids   . Left hemiparesis ([place])    RESIDUAL FROM STROKE--  WALKS W/ CANE  . Mild atherosclerosis of carotid artery, right    RICA 0-39% per duplex 06-28-2013  . Night sweats   . OA (osteoarthritis)    LEFT SHOULDER  . Painful bladder spasm   . Peripheral arterial disease ([place])   . Prostate cancer [facility]) urologist-  dr Gaynelle Arabian  oncologist-  dr [name]   dx 12/ 2017-- Stage T1c, Gleason 3+4,  PSA 10.4,  vol 51cc  . Sensorineural hearing loss, bilateral   . Type 2 diabetes mellitus treated with insulin ([place])   . Urge incontinence    wears depends  . Urinary retention   . Wears dentures     Past Surgical History:  Procedure Laterality Date  . ABDOMINAL AORTOGRAM W/LOWER EXTREMITY N/A 12/11/2018   Procedure: ABDOMINAL AORTOGRAM W/LOWER EXTREMITY;  Surgeon: Angelia Mould, MD;  Location: [place] [facility];  Service: Cardiovascular;  Laterality: N/A;  . ABDOMINAL AORTOGRAM W/LOWER EXTREMITY Bilateral  04/02/2019   Procedure: ABDOMINAL AORTOGRAM W/LOWER EXTREMITY;  Surgeon: Angelia Mould, MD;  Location: [place] [facility];  Service: Cardiovascular;  Laterality: Bilateral;  . AMPUTATION Left 04/28/2019   Procedure: LEFT BELOW KNEE AMPUTATION;  Surgeon: Newt Minion, MD;  Location: [place];  Service: Orthopedics;  Laterality: Left;  . ANTERIOR CERVICAL DECOMP/DISCECTOMY FUSION  11/27/1999   C5 -- C6  . BELOW KNEE LEG AMPUTATION Left 04/28/2019  . CIRCUMCISION  10/07/2001  . CYSTOSCOPY WITH INSERTION OF UROLIFT N/A 08/11/2017   Procedure: CYSTOSCOPY WITH INSERTION OF UROLIFT;  Surgeon: Cleon Gustin, MD;  Location: [facility];  Service: Urology;  Laterality: N/A;  . FOOT SURGERY  2005  . PERIPHERAL VASCULAR BALLOON ANGIOPLASTY  12/11/2018   Procedure: PERIPHERAL VASCULAR BALLOON ANGIOPLASTY;  Surgeon: Angelia Mould, MD;  Location: [place] [facility];  Service: Cardiovascular;;  Lt. SFA  . PROSTATE BIOPSY  12/12/2015  . RADIOACTIVE SEED IMPLANT N/A 05/30/2016   Procedure: RADIOACTIVE SEED IMPLANT/BRACHYTHERAPY IMPLANT WITH SPACE OAR;  Surgeon: Carolan Clines, MD;  [facility];  Service: Urology;  Laterality: N/A;  . SHOULDER ARTHROSCOPY WITH SUBACROMIAL DECOMPRESSION Left 11/28/2006   Debridement/ Acromioplasty/  CA ligament relesase  . TRANSTHORACIC ECHOCARDIOGRAM  05/15/2015   mild LVH,  ef 65-70%, codominant mitral inflow-iindeterminate diastolic function/      Prior to Admission medications   Medication Sig Start Date End Date Taking? Authorizing Provider  acetaminophen (TYLENOL) 325 MG tablet Take 1-2 tablets (325-650 mg total) by mouth every 6 (six) hours as needed for mild pain (pain score 1-3 or temp > 100.5). 04/30/19  Yes Persons, Bevely Palmer, PA  alfuzosin (UROXATRAL) 10 MG 24 hr tablet Take 10 mg by mouth daily with breakfast.   Yes [provider]  amLODipine (NORVASC) 5 MG tablet Take 5 mg by mouth daily.   Yes [provider]  aspirin EC 81 MG tablet Take 81 mg by mouth daily.   Yes [provider]  atorvastatin (LIPITOR) 20 MG tablet Take 20 mg by mouth daily.    Yes [provider]  clopidogrel (PLAVIX) 75 MG tablet Take 1 tablet (75 mg total) by mouth daily with breakfast. 12/12/18  Yes Rhyne, Samantha J, PA-C  donepezil (ARICEPT) 10 MG tablet Take 10 mg by mouth at bedtime.   Yes [provider]  fexofenadine (ALLEGRA) 60 MG tablet Take 60 mg by mouth 2 (two) times daily as needed for allergies or rhinitis.   Yes [provider]  losartan (COZAAR) 50 MG  tablet Take 50 mg by mouth daily.    Yes [provider]  metFORMIN (GLUCOPHAGE) 500 MG tablet Take 500 mg by mouth 2 (two) times daily with a meal.    Yes [provider]  Nutritional Supplements (ENSURE ENLIVE PO) Take 8 oz by mouth daily.   Yes [provider]  oxyCODONE (OXY IR/ROXICODONE) 5 MG immediate release tablet Take 1 tablet (5 mg total) by mouth every 4 (four) hours as needed for severe pain. 04/30/19  Yes Persons, Bevely Palmer, PA  polyethylene glycol (MIRALAX / GLYCOLAX) 17 g packet Take 17 g by mouth daily.   Yes [provider]  senna (SENOKOT) 8.6 MG TABS tablet Take 2 tablets by mouth at bedtime.   Yes [provider]  sildenafil (VIAGRA) 25 MG tablet Take 25 mg by mouth daily as needed for erectile dysfunction.   Yes [provider]  traZODone (DESYREL) 100 MG tablet Take 100 mg by mouth at bedtime.   Yes [provider]  gabapentin (NEURONTIN) 400 MG capsule Take 2 capsules (800 mg total) by mouth at bedtime. Patient not taking: Reported on 05/20/2019 04/30/19   Persons, Bevely Palmer, [place]    Scheduled Meds:  Infusions:  PRN Meds:    Allergies as of 05/20/2019  . (No Known Allergies)    Family History  Problem Relation Age of Onset  . Hypertension Mother   . Hypertension Father   . Cancer Brother        prostate    Social  History   Socioeconomic History  . Marital status: Divorced    Spouse name: Not on file  . Number of children: Not on file  . Years of education: Not on file  . Highest education level: Not on file  Occupational History  . Not on file  Tobacco Use  . Smoking status: Current Some Day Smoker    Packs/day: 0.25    Years: 45.00    Pack years: 11.25    Types: Cigarettes  . Smokeless tobacco: Never Used  . Tobacco comment: continues to smoke approx. 1 to 2 cig's per month  Substance and Sexual Activity  . Alcohol use: No  . Drug use: No  . Sexual activity: Yes  Other Topics Concern  . Not on file  Social History Narrative  . Not on file   Social Determinants of Health   Financial Resource Strain:   . Difficulty of Paying Living Expenses:   Food Insecurity:   . Worried About Charity fundraiser in the Last Year:   . Arboriculturist in the Last Year:   Transportation Needs:   . Film/video editor (Medical):   Marland Kitchen Lack of Transportation (Non-Medical):   Physical Activity:   . Days of Exercise per Week:   . Minutes of Exercise per Session:   Stress:   . Feeling of Stress :   Social Connections:   . Frequency of Communication with Friends and Family:   . Frequency of Social Gatherings with Friends and Family:   . Attends Religious Services:   . Active Member of Clubs or Organizations:   . Attends Archivist Meetings:   Marland Kitchen Marital Status:   Intimate Partner Violence:   . Fear of Current or Ex-Partner:   . Emotionally Abused:   Marland Kitchen Physically Abused:   . Sexually Abused:     REVIEW OF SYSTEMS: Constitutional: Wheelchair bound after AKA ENT:  No nose bleeds Pulm: No difficulty breathing, no  cough. CV:  No palpitations, no LE edema.  No angina GU: Urinary incontinence but no recent problems with difficulty voiding.  No hematuria GI: See HPI. Heme: Patient denies and there is no history of unusual or excessive bleeding or bruising. Transfusions: No previous  transfusions in the epic record and patient denies prior blood transfusion. Neuro:  No headaches, no peripheral tingling or numbness.  No syncope, no seizures. Derm:  No itching, no rash or sores.  Endocrine:  No sweats or chills.  No polyuria or dysuria Immunization: Not queried. Travel:  None beyond local counties in last few months.    PHYSICAL EXAM: Vital signs in last 24 hours: Vitals:   05/20/19 0830 05/20/19 0854  BP: 98/64   Pulse: 99   Resp: 15   Temp: 98.6 F (37 C)   SpO2: 98% 98%   Wt Readings from Last 3 Encounters:  05/20/19 59.9 kg  04/28/19 66.2 kg  04/02/19 70.3 kg    General: Pleasant, somewhat pale, comfortable, not acutely ill or in distress. Head: No facial asymmetry or swelling.  No head trauma. Eyes: Conjunctiva pale.  No scleral icterus.  EOMI. Ears: No hearing deficit Nose: No congestion, no discharge. Mouth: Full dentures on top.  Several lower teeth, primarily molars, missing below.  Mucosa moist, pink, clear.  Tongue midline. Neck: No thyromegaly, JVD, masses, bruits Lungs: Clear bilaterally.  No cough, no labored breathing. Heart: Regular rhythm, rate around 112 bpm.  No MRG.  S1, S2 audible Abdomen: Nontender, nondistended.  Soft.  Normal, active bowel sounds.  No hernias, HSM, bruits, masses.   Rectal: DRE was not performed but patient wearing depends and there is moderate volume of bright to medium red blood sitting in the depend, texture is soft, unformed. Musc/Skeltl: No joint redness, swelling.  Left AKA site covered with bandage and sock, was not inspected Extremities: No edema on the right side.  There are some abrasions on a few toes on the right foot Neurologic: Alert.  Appropriate.  Oriented x3.  Slow, deliberate but clear speech.  Moves all 4 limbs without tremor, strength not tested. Skin: No telangiectasia, or rashes. Nodes: No cervical adenopathy Psych: Calm, pleasant, cooperative.  Intake/Output from previous day: No  intake/output data recorded. Intake/Output this shift: Total I/O In: 50 [I.V.:50] Out: -   LAB RESULTS: Recent Labs    05/20/19 0850  WBC 9.2  HGB 10.7*  HCT 34.0*  PLT 457*   BMET Lab Results  Component Value Date   NA 135 05/20/2019   NA 132 (L) 04/28/2019   NA 137 04/02/2019   K 5.1 05/20/2019   K 3.9 04/28/2019   K 4.4 04/02/2019   CL 101 05/20/2019   CL 96 (L) 04/28/2019   CL 100 04/02/2019   CO2 21 (L) 05/20/2019   CO2 24 04/28/2019   CO2 27 08/16/2016   GLUCOSE 119 (H) 05/20/2019   GLUCOSE 251 (H) 04/28/2019   GLUCOSE 97 04/02/2019   BUN 34 (H) 05/20/2019   BUN 15 04/28/2019   BUN 18 04/02/2019   CREATININE 1.40 (H) 05/20/2019   CREATININE 1.03 04/28/2019   CREATININE 1.00 04/02/2019   CALCIUM 9.1 05/20/2019   CALCIUM 9.5 04/28/2019   CALCIUM 9.1 08/16/2016   LFT Recent Labs    05/20/19 0850  PROT 6.4*  ALBUMIN 3.0*  AST 13*  ALT 11  ALKPHOS 53  BILITOT 0.8   PT/INR Lab Results  Component Value Date   INR 1.0 05/20/2019   INR  1.04 05/14/2015   Hepatitis Panel No results for input(s): HEPBSAG, HCVAB, HEPAIGM, HEPBIGM in the last 72 hours. C-Diff No components found for: CDIFF Lipase     Component Value Date/Time   LIPASE 26 07/20/2015 2241    Drugs of Abuse     Component Value Date/Time   LABOPIA NONE DETECTED 06/28/2013 0521   COCAINSCRNUR NONE DETECTED 06/28/2013 0521   LABBENZ NONE DETECTED 06/28/2013 0521   AMPHETMU NONE DETECTED 06/28/2013 0521   THCU NONE DETECTED 06/28/2013 0521   LABBARB NONE DETECTED 06/28/2013 0521     RADIOLOGY STUDIES: No results found.    IMPRESSION:   *     GI bleed/hematochezia.  Suspect lower GI source by the appearance of the stool Empiric Protonix administered in the ED.  *    Acute blood loss anemia.  Normal MCV.  Current Hgb reflects 2 g drop compared with 3 weeks prior  *     CAD, chronic Plavix, last dose 5/12.  *     AKI.  Proportional rise of both BUN/creatinine.  *     Adenomatous colon polyp with high-grade dysplasia 2006.  No recurrent adenomas but hyperplastic polyp on repeat colonoscopy 2007.  No subsequent colonoscopies since 2007.   PLAN:     *    Will need colonoscopy and possibly upper endoscopy, timing to be determined but if patient remains stable, prefer to wait at least 3, if not 5, days off Plavix before pursuing endoscopic work-up ? nuc med RBC scan?  With his BUN/creatinine of 34/1.4, should we hold off on CT angio and reserve only if bleeding scan is positive?   *    Clear liquids for now, perhaps advance to more substantial diet within the next 24 hours.  *   Re: PPI.  He received single dose of Protonix 40 mg IV.  Going forward from here, as a precaution, would continue Protonix 40 mg IV bid.   *   FFP?    Azucena Freed  05/20/2019, 10:33 AM Phone [phone]  I have reviewed the entire case in detail with the above APP and discussed the plan in detail.  Therefore, I agree with the diagnoses recorded above. In addition,  I have personally interviewed and examined the patient and have personally reviewed any abdominal/pelvic CT scan images.  My additional thoughts are as follows:  (I saw the patient about 1 PM in the ED)  Brisk lower GI bleed, intermittent hypotension and tachycardia.  Source unclear, last colonoscopy many years ago. Mild elevation of BUN and creatinine, suspected to be prerenal from volume depletion rather than upper GI bleed.  Patient going for stat CT angiogram.  If positive, interventional radiology assistance for angiogram and hopeful control of bleeding source.  We will follow up results and determine timing of any next intervention.  Every 6 hour hemoglobin and hematocrit and transfuse as needed.  Second IV was being placed at the time of our evaluation.  Hold aspirin and Plavix.   Nelida Meuse III [phone]

## 2019-05-20 NOTE — Progress Notes (Signed)
Patient transferred to ICU for hypotension in setting of LGI bleeding.  CTA report just released - no extravasation seen.  Large amount of rectal stool (images reviewed) Raises concern for stercoral ulcer bleeding.  Oral bowel prep ordered for probable sigmoidoscopy/colonoscopy in AM.  Dr. Rush Landmark (ovn coverage for [facility]) aware in case urgent scope needed overnight.

## 2019-05-21 ENCOUNTER — Encounter (HOSPITAL_COMMUNITY)
Admission: EM | Disposition: A | Discharge status: Skilled Nursing Facility | Payer: Self-pay | Source: Skilled Nursing Facility | Attending: Internal Medicine

## 2019-05-21 ENCOUNTER — Inpatient Hospital Stay (HOSPITAL_COMMUNITY): Payer: Medicare (Managed Care) | Admitting: Certified Registered Nurse Anesthetist

## 2019-05-21 DIAGNOSIS — K635 Polyp of colon: Secondary | ICD-10-CM

## 2019-05-21 DIAGNOSIS — K626 Ulcer of anus and rectum: Principal | ICD-10-CM

## 2019-05-21 DIAGNOSIS — K621 Rectal polyp: Secondary | ICD-10-CM

## 2019-05-21 DIAGNOSIS — L8990 Pressure ulcer of unspecified site, unspecified stage: Secondary | ICD-10-CM | POA: Insufficient documentation

## 2019-05-21 HISTORY — PX: BIOPSY: SHX5522

## 2019-05-21 HISTORY — PX: COLONOSCOPY WITH PROPOFOL: SHX5780

## 2019-05-21 LAB — GLUCOSE, CAPILLARY
Glucose-Capillary: 84 mg/dL (ref 70–99)
Glucose-Capillary: 88 mg/dL (ref 70–99)
Glucose-Capillary: 100 mg/dL — ABNORMAL HIGH (ref 70–99)
Glucose-Capillary: 152 mg/dL — ABNORMAL HIGH (ref 70–99)
Glucose-Capillary: 208 mg/dL — ABNORMAL HIGH (ref 70–99)

## 2019-05-21 LAB — HEMOGLOBIN AND HEMATOCRIT, BLOOD
HCT: 32.1 % — ABNORMAL LOW (ref 39.0–52.0)
HCT: 30.8 % — ABNORMAL LOW (ref 39.0–52.0)
Hemoglobin: 10.1 g/dL — ABNORMAL LOW (ref 13.0–17.0)
Hemoglobin: 9.9 g/dL — ABNORMAL LOW (ref 13.0–17.0)

## 2019-05-21 LAB — CBC
HCT: 32.2 % — ABNORMAL LOW (ref 39.0–52.0)
Hemoglobin: 10.2 g/dL — ABNORMAL LOW (ref 13.0–17.0)
MCH: 27.1 pg (ref 26.0–34.0)
MCHC: 31.7 g/dL (ref 30.0–36.0)
MCV: 85.6 fL (ref 80.0–100.0)
Platelets: 315 10*3/uL (ref 150–400)
RBC: 3.76 MIL/uL — ABNORMAL LOW (ref 4.22–5.81)
RDW: 15.7 % — ABNORMAL HIGH (ref 11.5–15.5)
WBC: 8.8 10*3/uL (ref 4.0–10.5)
nRBC: 0 % (ref 0.0–0.2)

## 2019-05-21 LAB — MRSA PCR SCREENING: MRSA by PCR: NEGATIVE

## 2019-05-21 LAB — PHOSPHORUS: Phosphorus: 3.6 mg/dL (ref 2.5–4.6)

## 2019-05-21 LAB — MAGNESIUM: Magnesium: 1.6 mg/dL — ABNORMAL LOW (ref 1.7–2.4)

## 2019-05-21 SURGERY — COLONOSCOPY WITH PROPOFOL
Anesthesia: Monitor Anesthesia Care

## 2019-05-21 MED ORDER — LACTATED RINGERS IV SOLN: INTRAVENOUS | Status: DC | PRN

## 2019-05-21 MED ORDER — PROPOFOL 500 MG/50ML IV EMUL
INTRAVENOUS | Status: DC | PRN
  Administered 2019-05-21: 150 ug/kg/min via INTRAVENOUS

## 2019-05-21 MED ORDER — MAGNESIUM SULFATE 2 GM/50ML IV SOLN
2.0000 g | Freq: Once | INTRAVENOUS | Status: AC
  Administered 2019-05-21: 2 g via INTRAVENOUS
  Filled 2019-05-21: qty 50

## 2019-05-21 SURGICAL SUPPLY — 22 items

## 2019-05-21 NOTE — Interval H&P Note (Signed)
History and Physical Interval Note:  05/21/2019 12:46 PM  Billy Torres  has presented today for surgery, with the diagnosis of Hematochezia, hypotension, anemia.  The various methods of treatment have been discussed with the patient and family. After consideration of risks, benefits and other options for treatment, the patient has consented to  Procedure(s): COLONOSCOPY WITH PROPOFOL (N/A) as a surgical intervention.  The patient's history has been reviewed, patient examined, no change in status, stable for surgery.  I have reviewed the patient's chart and labs.  Questions were answered to the patient's satisfaction.     Nelida Meuse III

## 2019-05-21 NOTE — Social Work (Addendum)
Acknowledging pt tx to [facility]N. Pt from [facility], following for assistance with discharge when medically appropriate.   Billy Torres, MSW, [place] Work

## 2019-05-21 NOTE — Progress Notes (Signed)
Daily Rounding Note  05/21/2019, 9:21 AM  LOS: 1 day   SUBJECTIVE:   Chief complaint: Painless hematochezia Patient feels well.  Blood pressure still soft 90s over 60s at times, heart rate in the 90s.  Had some bleeding at the beginning of the movie prep consumption last night but it resolved.  Now the stool is yellow, clear.  OBJECTIVE:         Vital signs in last 24 hours:    Temp:  [98.1 F (36.7 C)-98.7 F (37.1 C)] 98.6 F (37 C) (05/14 0700) Pulse Rate:  [82-119] 94 (05/14 0800) Resp:  [10-22] 16 (05/14 0800) BP: (64-134)/(37-83) 91/60 (05/14 0800) SpO2:  [94 %-100 %] 97 % (05/14 0800) Weight:  [59.5 kg] 59.5 kg (05/14 0500) Last BM Date: 05/21/19 Filed Weights   05/20/19 0857 05/21/19 0500  Weight: 59.9 kg 59.5 kg   General: Alert, comfortable, looks well Heart: RRR Chest: No labored breathing or cough Abdomen: Soft without tenderness or distention.  Active bowel sounds. Extremities: No edema.  Left AKA Neuro/Psych: Pleasant, calm, cooperative.  Not confused.  Slow, deliberate but clear speech.  Intake/Output from previous day: 05/13 0701 - 05/14 0700 In: 1821.2 [I.V.:959.8; Blood:315; IV Piggyback:546.5] Out: 950 [Urine:950]  Intake/Output this shift: No intake/output data recorded.  Lab Results: Recent Labs    05/20/19 0850 05/20/19 2031 05/21/19 0324  WBC 9.2 11.3* 8.8  HGB 10.7* 10.8* 10.2*  HCT 34.0* 33.8* 32.2*  PLT 457* 359 315   BMET Recent Labs    05/20/19 0850  NA 135  K 5.1  CL 101  CO2 21*  GLUCOSE 119*  BUN 34*  CREATININE 1.40*  CALCIUM 9.1   LFT Recent Labs    05/20/19 0850  PROT 6.4*  ALBUMIN 3.0*  AST 13*  ALT 11  ALKPHOS 53  BILITOT 0.8   PT/INR Recent Labs    05/20/19 0850  LABPROT 12.3  INR 1.0   Hepatitis Panel No results for input(s): HEPBSAG, HCVAB, HEPAIGM, HEPBIGM in the last 72 hours.  Studies/Results: DG Chest 1 View  Result Date:  05/20/2019 CLINICAL DATA:  Weakness in a patient with rectal bleeding. EXAM: CHEST  1 VIEW COMPARISON:  PA and lateral chest 12/15/2017. FINDINGS: Lungs clear. Heart size normal. No pneumothorax or pleural fluid. No acute bony abnormality. IMPRESSION: Negative chest. Electronically Signed   By: Inge Rise M.D.   On: 05/20/2019 10:34   CT Angio Abd/Pel w/ and/or w/o  Result Date: 05/20/2019 CLINICAL DATA:  64 year old with GI bleeding. EXAM: CT ANGIOGRAPHY ABDOMEN AND PELVIS WITH CONTRAST AND WITHOUT CONTRAST TECHNIQUE: Multidetector CT imaging of the abdomen and pelvis was performed using the standard protocol during bolus administration of intravenous contrast. Multiplanar reconstructed images and MIPs were obtained and reviewed to evaluate the vascular anatomy. CONTRAST:  16mL OMNIPAQUE IOHEXOL 350 MG/ML SOLN COMPARISON:  CT abdomen 08/16/2016 FINDINGS: VASCULAR Aorta: Atherosclerotic disease in the infrarenal abdominal aorta with a small amount of irregular ulcerative plaque, best seen on sequence 6, image 70. Negative for an aortic aneurysm or significant stenosis. Celiac: Patent without evidence of aneurysm, dissection, vasculitis or significant stenosis. SMA: Patent without evidence of aneurysm, dissection, vasculitis or significant stenosis. Renals: Two right renal arteries are widely patent. Three left renal arteries are widely patent. IMA: Patent Inflow: Atherosclerotic plaque in the right common iliac artery without significant stenosis. 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 Right external iliac artery is patent without significant stenosis. Left common  and external iliac arteries are patent without significant stenosis. Plaque and at least mild stenosis involving the left hypogastric artery. Proximal Outflow: Atherosclerotic plaque involving the proximal right femoral arteries. Plaque and at least mild stenosis in the proximal right SFA. Plaque in the left SFA with at least moderate stenosis in  the proximal aspect. Veins: Compression of the left common iliac vein from the right common iliac artery which is a normal variant. There is no evidence for iliac vein DVT. IVC and renal veins are patent. Portal venous system is patent. Review of the MIP images confirms the above findings. NON-VASCULAR Lower chest: Lung bases are clear. Hepatobiliary: Normal appearance of the liver. No suspicious liver lesions. Mild distention of the gallbladder without surrounding inflammatory changes. Main hepatic veins are patent. No significant biliary dilatation. Pancreas: Unremarkable. No pancreatic ductal dilatation or surrounding inflammatory changes. Spleen: Normal in size without focal abnormality. Adrenals/Urinary Tract: Normal appearance of the adrenal glands. A 2.4 cm low-density structure in the right kidney upper pole has Hounsfield units of 10 on the noncontrast images and approximately 25 on the portal venous phase. This is most compatible with a renal cyst with pseudo enhancement. Evidence for additional small renal cysts. No hydronephrosis. Mild distention of the urinary bladder. Stomach/Bowel: Normal appearance of the of the stomach and duodenum without evidence for active bleeding or contrast extravasation. Normal appendix without inflammatory changes. The rectum is distended with a large amount of stool. Mucosal enhancement in the rectum with wall thickening. The wall thickening is well demonstrated on sequence 6, image 124. No evidence for acute hemorrhage or contrast extravasation in the rectum. No evidence for a bowel obstruction. Lymphatic: No abdominopelvic lymphadenopathy. Reproductive: Multiple brachytherapy seeds in the prostate. Other: Small amount of stranding or soft tissue thickening in the presacral region, similar to the examination from 2018. Negative for ascites. Negative for free air. Small periumbilical hernia containing fat. Musculoskeletal: No acute bone abnormality. IMPRESSION: VASCULAR 1.  Aortic Atherosclerosis (ICD10-I70.0). Atherosclerotic disease involving the distal abdominal aorta with a small amount of ulcerative plaque. Negative for an abdominal aortic aneurysm. 2. Main visceral arteries are patent.  Multiple renal arteries. 3. 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 4. Atherosclerotic disease with areas of stenosis involving the bilateral proximal superficial femoral arteries. NON-VASCULAR 1. Wall thickening and prominent mucosal enhancement in the rectum. The rectum is also distended with stool. Findings raise concern for proctitis. Proctitis may be associated with the adjacent prostate brachytherapy seeds. No discrete focus of GI bleeding or active contrast extravasation. 2. Renal cysts. Largest right renal cyst likely has a pseudo enhancement as described. 3. Mild bladder distension without surrounding inflammatory changes. Electronically Signed   By: Markus Daft M.D.   On: 05/20/2019 17:13    ASSESMENT:   *    Painless hematochezia. CTAP shows prominent rectal wall raising concern for proctitis.  There are brachii therapy seeds in the prostate.  No active bleed on angiography.  *    Blood loss anemia.  Maintaining Hb after 2 PRBCs.  *    Chronic Plavix, 81 ASA.  Last doses 05/19/2019   PLAN   *     Colonoscopy 1130 this morning.  Discussed plans and the risks benefits of the procedure with the patient and he is agreeable to proceed.    Azucena Freed  05/21/2019, 9:21 AM Phone [phone]

## 2019-05-21 NOTE — Plan of Care (Signed)
  Problem: Bowel/Gastric: Goal: Will show no signs and symptoms of gastrointestinal bleeding Outcome: Progressing   Problem: Education: Goal: Knowledge of General Education information will improve Description: Including pain rating scale, medication(s)/side effects and non-pharmacologic comfort measures Outcome: Progressing   Problem: Clinical Measurements: Goal: Diagnostic test results will improve Outcome: Progressing Goal: Cardiovascular complication will be avoided Outcome: Progressing   Problem: Elimination: Goal: Will not experience complications related to urinary retention Outcome: Progressing   Problem: Pain Managment: Goal: General experience of comfort will improve Outcome: Progressing   Problem: Safety: Goal: Ability to remain free from injury will improve Outcome: Progressing   Problem: Skin Integrity: Goal: Risk for impaired skin integrity will decrease Outcome: Progressing

## 2019-05-21 NOTE — Anesthesia Preprocedure Evaluation (Signed)
Anesthesia Evaluation  Patient identified by MRN, date of birth, ID band Patient awake    Reviewed: Allergy & Precautions, NPO status , Patient's Chart, lab work & pertinent test results  Airway Mallampati: III  TM Distance: >3 FB Neck ROM: Full    Dental  (+) Upper Dentures, Missing   Pulmonary Current Smoker and Patient abstained from smoking.,    Pulmonary exam normal breath sounds clear to auscultation       Cardiovascular hypertension, Pt. on medications + Peripheral Vascular Disease  Normal cardiovascular exam Rhythm:Regular Rate:Normal  ECG: NSR, rate 93   Neuro/Psych PSYCHIATRIC DISORDERS Dementia residual left hemiparesis and cognitive deficits TIACVA, Residual Symptoms    GI/Hepatic negative GI ROS, Neg liver ROS,   Endo/Other  diabetes, Oral Hypoglycemic Agents  Renal/GU Renal disease     Musculoskeletal  (+) Arthritis , Osteoarthritis,    Abdominal   Peds  Hematology  (+) anemia , HLD   Anesthesia Other Findings  Hematochezia, hypotension, anemia  Reproductive/Obstetrics                             Anesthesia Physical Anesthesia Plan  ASA: III  Anesthesia Plan: MAC   Post-op Pain Management:    Induction: Intravenous  PONV Risk Score and Plan: 0 and Propofol infusion and Treatment may vary due to age or medical condition  Airway Management Planned: Simple Face Mask  Additional Equipment:   Intra-op Plan:   Post-operative Plan:   Informed Consent: I have reviewed the patients History and Physical, chart, labs and discussed the procedure including the risks, benefits and alternatives for the proposed anesthesia with the patient or authorized representative who has indicated his/her understanding and acceptance.     Dental advisory given  Plan Discussed with: CRNA and Anesthesiologist  Anesthesia Plan Comments:         Anesthesia Quick Evaluation

## 2019-05-21 NOTE — Anesthesia Postprocedure Evaluation (Signed)
Anesthesia Post Note  Patient: Billy Torres  Procedure(s) Performed: COLONOSCOPY WITH PROPOFOL (N/A ) BIOPSY     Patient location during evaluation: PACU Anesthesia Type: MAC Level of consciousness: awake and alert Pain management: pain level controlled Vital Signs Assessment: post-procedure vital signs reviewed and stable Respiratory status: spontaneous breathing, nonlabored ventilation, respiratory function stable and patient connected to nasal cannula oxygen Cardiovascular status: stable and blood pressure returned to baseline Postop Assessment: no apparent nausea or vomiting Anesthetic complications: no    Last Vitals:  Vitals:   05/21/19 1537 05/21/19 1804  BP: 124/73 119/80  Pulse: 90 93  Resp: 20 18  Temp: 37 C 37.3 C  SpO2: 99% 99%    Last Pain:  Vitals:   05/21/19 1804  TempSrc: Oral  PainSc:                  [name] COKER

## 2019-05-21 NOTE — Op Note (Addendum)
[facility] Patient Name: Billy Torres Procedure Date : 05/21/2019 MRN: [id number] Attending MD: Estill Cotta. Loletha Carrow , MD Date of Birth: 1955/01/17 CSN: [id number] Age: 64 Admit Type: Inpatient Procedure:                Colonoscopy Indications:              Hematochezia, Acute post hemorrhagic anemia                           Incidentally, large cecal polyp with high grade                            dysplasia in 2006 Providers:                Estill Cotta. Loletha Carrow, MD, Glori Bickers, RN, Laverda Sorenson, Technician, Lerry Paterson, CRNA Referring MD:              Medicines:                Monitored Anesthesia Care Complications:            No immediate complications. Estimated Blood Loss:     Estimated blood loss was minimal. Procedure:                Pre-Anesthesia Assessment:                           - Prior to the procedure, a History and Physical                            was performed, and patient medications and                            allergies were reviewed. The patient's tolerance of                            previous anesthesia was also reviewed. The risks                            and benefits of the procedure and the sedation                            options and risks were discussed with the patient.                            All questions were answered, and informed consent                            was obtained. Prior Anticoagulants: The patient has                            taken Plavix (clopidogrel), last dose was 2 days  prior to procedure. ASA Grade Assessment: III - A                            patient with severe systemic disease. After                            reviewing the risks and benefits, the patient was                            deemed in satisfactory condition to undergo the                            procedure.                           After obtaining informed consent, the colonoscope                             was passed under direct vision. Throughout the                            procedure, the patient's blood pressure, pulse, and                            oxygen saturations were monitored continuously. The                            [id number]-[id number] ([id number]) Olympus colonoscope was                            introduced through the anus and advanced to the the                            cecum, identified by appendiceal orifice and                            ileocecal valve. The colonoscopy was performed                            without difficulty. The patient tolerated the                            procedure well. The quality of the bowel                            preparation was good. The ileocecal valve,                            appendiceal orifice, and rectum were photographed.                            The bowel preparation used was MoviPrep. Scope In: 1:17:35 PM Scope Out: 1:30:33 PM Scope Withdrawal Time: 0 hours 9 minutes 22 seconds  Total Procedure Duration: 0 hours 12 minutes 58 seconds  Findings:      The perianal and digital rectal examinations were normal.      A 35 mm polyp was found in the cecum. The polyp was carpet-like and       sessile, occupying about one-quarter of the cecal circumference and       wrapping over a fold adjacent to the ICV. It did not appear frankly       malignant. A single biopsy was taken with a cold forceps for histology.      A 6 mm polyp was found in the ascending colon. The polyp was sessile.       Not removed.      A 6 mm polyp was found in the rectum. The polyp was sessile. Not removed.      A single (solitary) five mm ulcer was found in the distal rectum. No       bleeding was present, and there was a small flat red spot (stigmata of       prior bleeding).      The exam was otherwise without abnormality. Impression:               - One 35 mm polyp in the cecum. Biopsied.                           - One 6 mm polyp in  the ascending colon.                           - One 6 mm polyp in the rectum.                           - A single (solitary) ulcer in the distal rectum.                           - The examination was otherwise normal.                           Stercoral ulcer from constipation was the source of                            bleeding that has now resolved. Moderate Sedation:      MAC sedation used Recommendation:           - Return patient to hospital ward for ongoing care.                           - Resume regular diet.                           - Miralax 1 capful (17 grams) in 8 ounces of water                            PO daily.                           - Hold plavix 14 days to allow rectal ulcer to heal.                           -  Repeat colonoscopy for EMR of cecal polyp and                            removal of additional polyps at outpatient                            appointment to be scheduled.                           - If no recurrence of bleeding by tomorrow AM, this                            patient can be discharged from the hospital from a                            GI perspective. Procedure Code(s):        --- Professional ---                           [id number], Colonoscopy, flexible; with biopsy, single                            or multiple Diagnosis Code(s):        --- Professional ---                           K63.5, Polyp of colon                           K62.1, Rectal polyp                           K62.6, Ulcer of anus and rectum                           K92.1, Melena (includes Hematochezia)                           D62, Acute posthemorrhagic anemia CPT copyright 2019 American Medical Association. All rights reserved. The codes documented in this report are preliminary and upon coder review may  be revised to meet current compliance requirements. Reesa Gotschall L. Loletha Carrow, MD 05/21/2019 1:47:44 PM This report has been signed electronically. Number of Addenda: 0

## 2019-05-21 NOTE — Transfer of Care (Signed)
Immediate Anesthesia Transfer of Care Note  Patient: Billy Torres  Procedure(s) Performed: COLONOSCOPY WITH PROPOFOL (N/A ) BIOPSY  Patient Location: Endoscopy Unit  Anesthesia Type:MAC  Level of Consciousness: awake  Airway & Oxygen Therapy: Patient Spontanous Breathing and Patient connected to face mask oxygen  Post-op Assessment: Report given to RN and Post -op Vital signs reviewed and stable  Post vital signs: Reviewed and stable  Last Vitals:  Vitals Value Taken Time  BP    Temp    Pulse    Resp    SpO2      Last Pain:  Vitals:   05/21/19 1230  TempSrc: Temporal  PainSc: 0-No pain         Complications: No apparent anesthesia complications

## 2019-05-21 NOTE — Progress Notes (Signed)
IMTS to assume care of patient on 05/22/2019 at 7AM.   Ina Homes, MD

## 2019-05-21 NOTE — Progress Notes (Signed)
Pt arrived to room 6N02 via bed from 2MW. Received report from [place], Therapist, sports. See assessment. Will continue to monitor.

## 2019-05-21 NOTE — Progress Notes (Signed)
NAME:  Billy Torres, MRN:  [id number], DOB:  1955-05-30, LOS: 1 ADMISSION DATE:  05/20/2019, CONSULTATION DATE: May 20, 2019 REFERRING MD: Dr. Tarri Abernethy, CHIEF COMPLAINT:  GI bleeding  Brief History   64 year old male admitted from the [facility] emergency department on May 20, 2019 in the setting of the rapid lower GI bleed.  History of present illness   64 year old male who was recently discharged from this facility after a below the knee amputation in April.  He has an extensive past medical history including a left-sided MCA stroke in 2015 and lives in a nursing home.  In April 2021 he was noted to have gangrene of his left foot.  He was admitted to [facility] underwent amputation.  He was discharged to a nursing facility.  He also has a past medical history significant for dementia, hypertension, diabetes and prostate cancer among other things.  He was unable to provide history because of aphasia.  He denies any sort of pain on my exam.  Has had multiple bloody bowel movements in the emergency room and despite fluid resuscitation and blood product administration with blood pressure was in the systolic [id number] so pulmonary critical care medicine was consulted for further evaluation.  GI medicine is seen the patient and recommended a CT angiogram of his abdomen and possible IR embolization if a source of bleeding could be localized.  Past Medical History  Left MCA stroke June 2015 with residual deficits Right nonhemorrhagic infarct corona radiata 2017 Hypertension Left hemiparesis Chronic kidney disease, severity unspecified Dementia Degenerative disc disease Osteoarthritis Prostate cancer Type 2 diabetes  Significant Hospital Events   5/13 admission  Consults:  GI IR  Procedures:    Significant Diagnostic Tests:  5/13 CT angiogram abdomen>   Micro Data:  5/13 SARS CoV 2 > negative  Antimicrobials:   none   Interim history/subjective:  Completed bowel prep with  no further bloody movements. No contrast extravasation on CTA.   Objective   Blood pressure 91/60, pulse 94, temperature 98.6 F (37 C), temperature source Oral, resp. rate 16, height 5\' 8"  (1.727 m), weight 59.5 kg, SpO2 97 %.       Intake/Output Summary (Last 24 hours) at 05/21/2019 0851 Last data filed at 05/21/2019 0600 Gross per 24 hour  Intake 1771.23 ml  Output 950 ml  Net 821.23 ml   Filed Weights   05/20/19 0857 05/21/19 0500  Weight: 59.9 kg 59.5 kg    Examination:  General:  Resting comfortably in bed HENT: NCAT OP clear PULM: CTA B, normal effort CV: RRR, no mgr GI: BS+, soft, nontender MSK: R AKA, R muscle atrophy Neuro: awake and answers appropriately. Left hemiparesis.  Resolved Hospital Problem list   Hemorrhagic shock.   Assessment & Plan:   Acute lower GI bleed appears to have stopped. No hypotension, HB stable overnight. Return to medical ward.  GI to decide regarding colonoscopy.  Stop iv PPI.  AKI - improving Volume resuscitate LR 75cc/hr after bolus Monitor BMET and UOP Replace electrolytes as needed  History of stroke (bilateral) Aphasia paraplegia Hold ASA/Plavix  BKA April 2021 Peripheral arterial disease Hypertension Hyperllipidemia Wound care per routine Restart home meds on 5/14 if stable  Best practice:  Diet: npo Pain/Anxiety/Delirium protocol (if indicated): n/a VAP protocol (if indicated): n/a DVT prophylaxis: SCD GI prophylaxis: PPI Glucose control: SSI for DM type2.  Mobility: bed rest Code Status: full Family Communication: called the patient's Son Doreen, could not get through, left a  message; Number listed for Nuala Alpha is incorrect Disposition: to IMTS  Labs   CBC: Recent Labs  Lab 05/20/19 0850 05/20/19 2031 05/21/19 0324  WBC 9.2 11.3* 8.8  NEUTROABS 6.7  --   --   HGB 10.7* 10.8* 10.2*  HCT 34.0* 33.8* 32.2*  MCV 90.9 85.4 85.6  PLT 457* [id number]    Basic Metabolic Panel: Recent Labs   Lab 05/20/19 0850 05/21/19 0324  NA 135  --   K 5.1  --   CL 101  --   CO2 21*  --   GLUCOSE 119*  --   BUN 34*  --   CREATININE 1.40*  --   CALCIUM 9.1  --   MG  --  1.6*  PHOS  --  3.6   GFR: Estimated Creatinine Clearance: 44.3 mL/min (A) (by C-G formula based on SCr of 1.4 mg/dL (H)). Recent Labs  Lab 05/20/19 0850 05/20/19 2031 05/21/19 0324  WBC 9.2 11.3* 8.8    Liver Function Tests: Recent Labs  Lab 05/20/19 0850  AST 13*  ALT 11  ALKPHOS 53  BILITOT 0.8  PROT 6.4*  ALBUMIN 3.0*   No results for input(s): LIPASE, AMYLASE in the last 168 hours. No results for input(s): AMMONIA in the last 168 hours.  ABG    Component Value Date/Time   TCO2 30 04/02/2019 0703     Coagulation Profile: Recent Labs  Lab 05/20/19 0850  INR 1.0    Cardiac Enzymes: No results for input(s): CKTOTAL, CKMB, CKMBINDEX, TROPONINI in the last 168 hours.  HbA1C: Hemoglobin A1C  Date/Time Value Ref Range Status  08/14/2015 04:08 PM 6.5  Final   Hgb A1c MFr Bld  Date/Time Value Ref Range Status  05/20/2019 12:01 PM 6.1 (H) 4.8 - 5.6 % Final    Comment:    (NOTE) Pre diabetes:          5.7%-6.4% Diabetes:              >6.4% Glycemic control for   <7.0% adults with diabetes   05/12/2015 09:50 AM 7.2 (H) <5.7 % Final    Comment:      For someone without known diabetes, a hemoglobin A1c value of 6.5% or greater indicates that they may have diabetes and this should be confirmed with a follow-up test.   For someone with known diabetes, a value <7% indicates that their diabetes is well controlled and a value greater than or equal to 7% indicates suboptimal control. A1c targets should be individualized based on duration of diabetes, age, comorbid conditions, and other considerations.   Currently, no consensus exists for use of hemoglobin A1c for diagnosis of diabetes for children.       CBG: Recent Labs  Lab 05/20/19 1725 05/20/19 1921 05/20/19 2327 05/21/19  0321 05/21/19 0727  GLUCAP [phone] [place], [facility] ICU Physician [place]  Pager: [phone] Mobile: [phone] After hours: [phone].  05/21/2019, 8:52 AM

## 2019-05-22 LAB — BASIC METABOLIC PANEL
Anion gap: 8 (ref 5–15)
BUN: 10 mg/dL (ref 8–23)
CO2: 22 mmol/L (ref 22–32)
Calcium: 9 mg/dL (ref 8.9–10.3)
Chloride: 106 mmol/L (ref 98–111)
Creatinine, Ser: 0.83 mg/dL (ref 0.61–1.24)
GFR calc Af Amer: >60 mL/min (ref >60)
GFR calc non Af Amer: >60 mL/min (ref >60)
Glucose, Bld: 123 mg/dL — ABNORMAL HIGH (ref 70–99)
Potassium: 4.3 mmol/L (ref 3.5–5.1)
Sodium: 136 mmol/L (ref 135–145)

## 2019-05-22 LAB — CBC
HCT: 28.6 % — ABNORMAL LOW (ref 39.0–52.0)
Hemoglobin: 9.2 g/dL — ABNORMAL LOW (ref 13.0–17.0)
MCH: 27.2 pg (ref 26.0–34.0)
MCHC: 32.2 g/dL (ref 30.0–36.0)
MCV: 84.6 fL (ref 80.0–100.0)
Platelets: 307 10*3/uL (ref 150–400)
RBC: 3.38 MIL/uL — ABNORMAL LOW (ref 4.22–5.81)
RDW: 15 % (ref 11.5–15.5)
WBC: 10.8 10*3/uL — ABNORMAL HIGH (ref 4.0–10.5)
nRBC: 0 % (ref 0.0–0.2)

## 2019-05-22 LAB — GLUCOSE, CAPILLARY
Glucose-Capillary: 122 mg/dL — ABNORMAL HIGH (ref 70–99)
Glucose-Capillary: 204 mg/dL — ABNORMAL HIGH (ref 70–99)
Glucose-Capillary: 149 mg/dL — ABNORMAL HIGH (ref 70–99)
Glucose-Capillary: 198 mg/dL — ABNORMAL HIGH (ref 70–99)

## 2019-05-22 LAB — CBC WITH DIFFERENTIAL/PLATELET
Abs Immature Granulocytes: 0.04 10*3/uL (ref 0.00–0.07)
Basophils Absolute: 0 10*3/uL (ref 0.0–0.1)
Basophils Relative: 0 %
Eosinophils Absolute: 0.1 10*3/uL (ref 0.0–0.5)
Eosinophils Relative: 1 %
HCT: 31.8 % — ABNORMAL LOW (ref 39.0–52.0)
Hemoglobin: 10.1 g/dL — ABNORMAL LOW (ref 13.0–17.0)
Immature Granulocytes: 0 %
Lymphocytes Relative: 22 %
Lymphs Abs: 2.1 10*3/uL (ref 0.7–4.0)
MCH: 26.9 pg (ref 26.0–34.0)
MCHC: 31.8 g/dL (ref 30.0–36.0)
MCV: 84.8 fL (ref 80.0–100.0)
Monocytes Absolute: 0.7 10*3/uL (ref 0.1–1.0)
Monocytes Relative: 7 %
Neutro Abs: 6.5 10*3/uL (ref 1.7–7.7)
Neutrophils Relative %: 70 %
Platelets: 305 10*3/uL (ref 150–400)
RBC: 3.75 MIL/uL — ABNORMAL LOW (ref 4.22–5.81)
RDW: 15.1 % (ref 11.5–15.5)
WBC: 9.5 10*3/uL (ref 4.0–10.5)
nRBC: 0 % (ref 0.0–0.2)

## 2019-05-22 LAB — BPAM FFP
Blood Product Expiration Date: 202105162359
ISSUE DATE / TIME: 202105121537
Unit Type and Rh: 6200

## 2019-05-22 LAB — PREPARE FRESH FROZEN PLASMA

## 2019-05-22 LAB — MAGNESIUM: Magnesium: 1.3 mg/dL — ABNORMAL LOW (ref 1.7–2.4)

## 2019-05-22 MED ORDER — MAGNESIUM SULFATE 4 GM/100ML IV SOLN
4.0000 g | Freq: Once | INTRAVENOUS | Status: AC
  Administered 2019-05-22: 4 g via INTRAVENOUS
  Filled 2019-05-22: qty 100

## 2019-05-22 MED ORDER — ASPIRIN EC 81 MG PO TBEC
81.0000 mg | DELAYED_RELEASE_TABLET | Freq: Every day | ORAL | Status: DC
  Administered 2019-05-22: 81 mg via ORAL
  Filled 2019-05-22: qty 1

## 2019-05-22 MED ORDER — ATORVASTATIN CALCIUM 10 MG PO TABS
20.0000 mg | ORAL_TABLET | Freq: Every day | ORAL | Status: DC
  Administered 2019-05-22 – 2019-05-28 (×6): 20 mg via ORAL
  Filled 2019-05-22 (×6): qty 2

## 2019-05-22 MED ORDER — ACETAMINOPHEN 325 MG PO TABS
650.0000 mg | ORAL_TABLET | Freq: Four times a day (QID) | ORAL | Status: DC | PRN
  Administered 2019-05-23 – 2019-05-27 (×12): 650 mg via ORAL
  Filled 2019-05-22 (×12): qty 2

## 2019-05-22 MED ORDER — AMLODIPINE BESYLATE 5 MG PO TABS
5.0000 mg | ORAL_TABLET | Freq: Every day | ORAL | Status: DC
  Administered 2019-05-22: 5 mg via ORAL
  Filled 2019-05-22: qty 1

## 2019-05-22 MED ORDER — DONEPEZIL HCL 10 MG PO TABS
10.0000 mg | ORAL_TABLET | Freq: Every day | ORAL | Status: DC
  Administered 2019-05-22 – 2019-05-27 (×6): 10 mg via ORAL
  Filled 2019-05-22 (×6): qty 1

## 2019-05-22 NOTE — Progress Notes (Signed)
   Subjective:   Patient reports that he is doing well today. Denies any light headedness, dizziness, fatigue, CP, or other symptoms. He does not think he has had any more bloody bowel movement. He ate yesterday with no issues.   Discussed results of EGD, that there was an ulcer however that it was not bleeding, there was also some polyps. And that he will need to be on miralax and he will need to hold his plavix. He reported that he was ready to go home.   Objective:  Vital signs in last 24 hours: Vitals:   05/21/19 2026 05/22/19 0116 05/22/19 0452 05/22/19 0507  BP: 103/64 (!) 141/79 132/78 132/78  Pulse: 77 75 78 86  Resp:  16 17 17   Temp: 99.2 F (37.3 C) 99.1 F (37.3 C) 98.7 F (37.1 C) 98.7 F (37.1 C)  TempSrc: Oral Oral Oral Oral  SpO2: 100% 100% 94% 98%  Weight:      Height:        Physical Exam Vitals and nursing note reviewed.  Constitutional:      General: He is not in acute distress.    Appearance: He is normal weight.  Cardiovascular:     Rate and Rhythm: Normal rate and regular rhythm.     Heart sounds: No murmur.  Pulmonary:     Effort: Pulmonary effort is normal. No respiratory distress.  Abdominal:     General: Bowel sounds are normal. There is no distension.     Palpations: Abdomen is soft.     Tenderness: There is no abdominal tenderness.  Skin:    General: Skin is warm and dry.  Neurological:     General: No focal deficit present.     Mental Status: He is alert and oriented to person, place, and time. Mental status is at baseline.  Psychiatric:        Mood and Affect: Mood normal.        Behavior: Behavior normal.    Assessment/Plan:  Active Problems:   Acute lower GI bleeding   Lower GI bleed   Pressure injury of skin  Mattis Perl is a 64 year old male with hemiplegia s/p CVA, dementia, HTN, DM, prostate cancer, urinary retention, and HLD presented to the emergency department after having a bloodied bowel movement and subsequently  admitted for lower GI bleed with symptomatic anemia. Patient was admitted to the ICU on 5/13 for concern of hypovolemic shock and transferred back to the floor on 5/15.   # Acute lower GI bleed Colonoscopy showed evidence of small rectal ulcer with stigmata of recent bleed, concerning for stercoral ulcer. No recurrence of bleeding since day of admission. Vital signs have remained stable. Hemoglobin has remained stable.   - Appreciate GI recs - Trend hemoglobin  - Miralax daily - Holding Plavix for 14 days post-GI bleed  # Hemiplegia s/p CVA # PAD # Hyperlipidemia - Atorvastatin  - Aspirin 81 mg - Holding Plavix for 14 days post-GI bleed  # HTN  Restarting anti-HTNs gradually.   - Amlodipine 5 mg QD - Restart Losartan tomorrow   # T2DM  - On metformin as outpatient  - Start SSI   # Hypomagnesemia  - MgS 4 g once today  # AKI (resolved)   Dispo: Anticipated discharge pending SNF placement   Dr. Jose Persia Internal Medicine PGY-1  Pager: [phone] 05/22/2019, 6:59 AM

## 2019-05-22 NOTE — NC FL2 (Signed)
[place] LEVEL OF CARE SCREENING TOOL     IDENTIFICATION  Patient Name: Billy Torres: Jun 29, 1955 Sex: male Admission Date (Current Location): 05/20/2019  [facility] and [place] Number:  Herbalist and Address:  [facility], [place] [address], [place], [place] [address]      Provider Number: [id number]  Attending Physician Name and Address:  Sid Falcon, MD  Relative Name and Phone Number:       Current Level of Care: Hospital Recommended Level of Care: [place] Prior Approval Number:    Date Approved/Denied:   PASRR Number: [id number]  Discharge Plan: SNF    Current Diagnoses: Patient Active Problem List   Diagnosis Date Noted  . Pressure injury of skin 05/21/2019  . Acute lower GI bleeding 05/20/2019  . Lower GI bleed 05/20/2019  . Acquired absence of left leg below knee ([place]) 05/05/2019  . Acute osteomyelitis of left foot ([place]) 04/28/2019  . Cutaneous abscess of left foot   . PVD (peripheral vascular disease) ([place]) 12/11/2018  . Hydronephrosis due to obstruction of bladder   . Acute urinary retention 08/15/2016  . Malignant neoplasm of prostate ([place]) 02/26/2016  . Muscle spasticity   . Dementia ([place])   . Cerebral infarction due to unspecified mechanism   . Leukocytosis   . Acute thrombotic stroke ([place]) 05/17/2015  . Diabetes mellitus due to underlying condition with complication, without long-term current use of insulin ([place])   . Gait disturbance, post-stroke   . Hemiparesis ([place])   . Cognitive deficit due to old cerebral infarction   . Benign essential HTN   . Diabetes mellitus type 2 in nonobese (HCC)   . Essential hypertension   . History of CVA (cerebrovascular accident) without residual deficits   . Ataxia, post-stroke   . Dysarthria, post-stroke   . Tobacco abuse   . HLD (hyperlipidemia)   . Acute CVA (cerebrovascular accident) ([place]) 05/15/2015  . Left-sided weakness  05/14/2015  . Right shoulder pain 03/03/2014  . History of CVA (cerebrovascular accident) 02/28/2014  . Smoking 06/29/2013  . CVA (cerebral infarction) 06/29/2013  . Dyslipidemia 06/29/2013  . TIA (transient ischemic attack) 06/27/2013  . Diabetes ([place]) 06/27/2013    Orientation RESPIRATION BLADDER Height & Weight     Self, Time, Situation, Place  Normal Incontinent, External catheter Weight: 131 lb 2.8 oz (59.5 kg) Height:  5\' 8"  (172.7 cm)  BEHAVIORAL SYMPTOMS/MOOD NEUROLOGICAL BOWEL NUTRITION STATUS      Continent Diet(see discharge summary)  AMBULATORY STATUS COMMUNICATION OF NEEDS Skin   Extensive Assist Verbally PU Stage and Appropriate Care, Surgical wounds, Other (Comment)(stage 1 on coccyx; previous BKA, closed incison on foot; deep tissue injury on foot)                       Personal Care Assistance Level of Assistance  Bathing, Feeding, Dressing Bathing Assistance: Maximum assistance Feeding assistance: Independent Dressing Assistance: Maximum assistance     Functional Limitations Info  Sight, Hearing, Speech Sight Info: Adequate Hearing Info: Adequate Speech Info: Adequate    SPECIAL CARE FACTORS FREQUENCY  PT (By licensed PT), OT (By licensed OT)     PT Frequency: 5x week OT Frequency: 5x week            Contractures Contractures Info: Not present    Additional Factors Info  Code Status, Allergies, Insulin Sliding Scale Code Status Info: Full Code Allergies Info: No Known Allergies  Insulin Sliding Scale Info: insulin aspart (novoLOG) injection 0-5 Units daily at bedtime; insulin aspart (novoLOG) injection 0-9 Units 3x daily with meals       Current Medications (05/22/2019):  This is the current hospital active medication list Current Facility-Administered Medications  Medication Dose Route Frequency Provider Last Rate Last Admin  . Chlorhexidine Gluconate Cloth 2 % PADS 6 each  6 each Topical Daily Agarwala, Ravi, MD      . insulin  aspart (novoLOG) injection 0-5 Units  0-5 Units Subcutaneous QHS Kipp Brood, MD   2 Units at 05/21/19 2210  . insulin aspart (novoLOG) injection 0-9 Units  0-9 Units Subcutaneous TID WC Kipp Brood, MD   1 Units at 05/22/19 [id number]     Discharge Medications: Please see discharge summary for a list of discharge medications.  Relevant Imaging Results:  Relevant Lab Results:   Additional Information SSN: [id number]  [place]

## 2019-05-22 NOTE — Progress Notes (Signed)
Patient called to be changed for bowel incontinence.  Pt was noted to have a bloody bm approximately 200 cc. Denies any pain. VSS. Provider notified , cbc ordered,will continue to monitor.

## 2019-05-22 NOTE — Progress Notes (Signed)
[facility] Progress Note  Chief Complaint: Hematochezia  History:  No further bleeding since yesterday's colonoscopy.  He denies abdominal pain, has been hungry and tolerating a regular diet without difficulty.  No clinical events since I saw him yesterday.  ROS: Cardiovascular: Denies chest pain Respiratory: Denies dyspnea Complains of chronic leg pain  Objective:   Current Facility-Administered Medications:  .  Chlorhexidine Gluconate Cloth 2 % PADS 6 each, 6 each, Topical, Daily, Agarwala, Ravi, MD .  insulin aspart (novoLOG) injection 0-5 Units, 0-5 Units, Subcutaneous, QHS, Kipp Brood, MD, 2 Units at 05/21/19 2210 .  insulin aspart (novoLOG) injection 0-9 Units, 0-9 Units, Subcutaneous, TID WC, Agarwala, Ravi, MD, 3 Units at 05/22/19 1159     Vital signs in last 24 hrs: Vitals:   05/22/19 0507 05/22/19 1023  BP: 132/78 116/67  Pulse: 86 87  Resp: 17 18  Temp: 98.7 F (37.1 C) 99.4 F (37.4 C)  SpO2: 98% 99%    Intake/Output Summary (Last 24 hours) at 05/22/2019 1339 Last data filed at 05/22/2019 1000 Gross per 24 hour  Intake 150.59 ml  Output 2800 ml  Net -2649.41 ml     Physical Exam Alert, conversational but with restricted affect as before  HEENT: sclera anicteric, oral mucosa without lesion  Cardiac: RRR without murmurs, S1S2 heard,   Pulm: clear to auscultation bilaterally, normal RR and effort noted  Abdomen: soft, no tenderness, with active bowel sounds. No guarding or palpable hepatosplenomegaly Pale, no jaundice Recent Labs:  CBC Latest Ref Rng & Units 05/22/2019 05/21/2019 05/21/2019  WBC 4.0 - 10.5 K/uL 9.5 - -  Hemoglobin 13.0 - 17.0 g/dL 10.1(L) 9.9(L) 10.1(L)  Hematocrit 39.0 - 52.0 % 31.8(L) 30.8(L) 32.1(L)  Platelets 150 - 400 K/uL 305 - -    Recent Labs  Lab 05/20/19 0850  INR 1.0   CMP Latest Ref Rng & Units 05/22/2019 05/20/2019 04/28/2019  Glucose 70 - 99 mg/dL 123(H) 119(H) 251(H)  BUN 8 - 23 mg/dL 10 34(H) 15  Creatinine  0.61 - 1.24 mg/dL 0.83 1.40(H) 1.03  Sodium 135 - 145 mmol/L 136 135 132(L)  Potassium 3.5 - 5.1 mmol/L 4.3 5.1 3.9  Chloride 98 - 111 mmol/L 106 101 96(L)  CO2 22 - 32 mmol/L 22 21(L) 24  Calcium 8.9 - 10.3 mg/dL 9.0 9.1 9.5  Total Protein 6.5 - 8.1 g/dL - 6.4(L) -  Total Bilirubin 0.3 - 1.2 mg/dL - 0.8 -  Alkaline Phos 38 - 126 U/L - 53 -  AST 15 - 41 U/L - 13(L) -  ALT 0 - 44 U/L - 11 -    Assessment & Plan  Assessment:  Hematochezia Acute blood loss anemia Stercoral ulcer found at colonoscopy yesterday.  Resulted from severe constipation in a bedridden patient at extended-care facility Large cecal polyp in 2 additional smaller colon polyps found yesterday.  Not removed because patient therapeutic on Plavix.  Plan: Patient is stable for discharge at any time from a GI perspective. Hold Plavix total of 14 days MiraLAX once to twice daily to maintain soft bowel movements and prevent recurrence of stercoral ulcer.  Dose adjust as needed. We will arrange outpatient follow-up to plan repeat colonoscopy and EMR of large cecal polyp along with polypectomy of other smaller polyps.  Signing off, call as needed.  Nelida Meuse III Office: [phone]

## 2019-05-23 ENCOUNTER — Encounter (HOSPITAL_COMMUNITY)
Admission: EM | Disposition: A | Discharge status: Skilled Nursing Facility | Payer: Self-pay | Source: Skilled Nursing Facility | Attending: Internal Medicine

## 2019-05-23 ENCOUNTER — Inpatient Hospital Stay (HOSPITAL_COMMUNITY): Payer: Medicare (Managed Care) | Admitting: Anesthesiology

## 2019-05-23 HISTORY — PX: HEMOSTASIS CLIP PLACEMENT: SHX6857

## 2019-05-23 HISTORY — PX: FLEXIBLE SIGMOIDOSCOPY: SHX5431

## 2019-05-23 LAB — BASIC METABOLIC PANEL WITH GFR
Anion gap: 8 (ref 5–15)
BUN: 9 mg/dL (ref 8–23)
CO2: 24 mmol/L (ref 22–32)
Calcium: 8.6 mg/dL — ABNORMAL LOW (ref 8.9–10.3)
Chloride: 104 mmol/L (ref 98–111)
Creatinine, Ser: 0.78 mg/dL (ref 0.61–1.24)
GFR calc Af Amer: >60 mL/min
GFR calc non Af Amer: >60 mL/min
Glucose, Bld: 155 mg/dL — ABNORMAL HIGH (ref 70–99)
Potassium: 3.6 mmol/L (ref 3.5–5.1)
Sodium: 136 mmol/L (ref 135–145)

## 2019-05-23 LAB — GLUCOSE, CAPILLARY
Glucose-Capillary: 240 mg/dL — ABNORMAL HIGH (ref 70–99)
Glucose-Capillary: 162 mg/dL — ABNORMAL HIGH (ref 70–99)
Glucose-Capillary: 108 mg/dL — ABNORMAL HIGH (ref 70–99)
Glucose-Capillary: 114 mg/dL — ABNORMAL HIGH (ref 70–99)
Glucose-Capillary: 215 mg/dL — ABNORMAL HIGH (ref 70–99)

## 2019-05-23 LAB — CBC WITH DIFFERENTIAL/PLATELET
Abs Immature Granulocytes: 0.03 10*3/uL (ref 0.00–0.07)
Basophils Absolute: 0 10*3/uL (ref 0.0–0.1)
Basophils Relative: 1 %
Eosinophils Absolute: 0.1 10*3/uL (ref 0.0–0.5)
Eosinophils Relative: 1 %
HCT: 28.8 % — ABNORMAL LOW (ref 39.0–52.0)
Hemoglobin: 9.2 g/dL — ABNORMAL LOW (ref 13.0–17.0)
Immature Granulocytes: 0 %
Lymphocytes Relative: 24 %
Lymphs Abs: 2 10*3/uL (ref 0.7–4.0)
MCH: 27.5 pg (ref 26.0–34.0)
MCHC: 31.9 g/dL (ref 30.0–36.0)
MCV: 86 fL (ref 80.0–100.0)
Monocytes Absolute: 0.6 10*3/uL (ref 0.1–1.0)
Monocytes Relative: 8 %
Neutro Abs: 5.6 10*3/uL (ref 1.7–7.7)
Neutrophils Relative %: 66 %
Platelets: 318 10*3/uL (ref 150–400)
RBC: 3.35 MIL/uL — ABNORMAL LOW (ref 4.22–5.81)
RDW: 14.9 % (ref 11.5–15.5)
WBC: 8.3 10*3/uL (ref 4.0–10.5)
nRBC: 0 % (ref 0.0–0.2)

## 2019-05-23 LAB — CBC
HCT: 22.7 % — ABNORMAL LOW (ref 39.0–52.0)
HCT: 29 % — ABNORMAL LOW (ref 39.0–52.0)
HCT: 27.8 % — ABNORMAL LOW (ref 39.0–52.0)
Hemoglobin: 7.2 g/dL — ABNORMAL LOW (ref 13.0–17.0)
Hemoglobin: 9.3 g/dL — ABNORMAL LOW (ref 13.0–17.0)
Hemoglobin: 9.1 g/dL — ABNORMAL LOW (ref 13.0–17.0)
MCH: 27.5 pg (ref 26.0–34.0)
MCH: 27.1 pg (ref 26.0–34.0)
MCH: 27.4 pg (ref 26.0–34.0)
MCHC: 31.7 g/dL (ref 30.0–36.0)
MCHC: 32.1 g/dL (ref 30.0–36.0)
MCHC: 32.7 g/dL (ref 30.0–36.0)
MCV: 86.6 fL (ref 80.0–100.0)
MCV: 84.5 fL (ref 80.0–100.0)
MCV: 83.7 fL (ref 80.0–100.0)
Platelets: 274 10*3/uL (ref 150–400)
Platelets: 205 10*3/uL (ref 150–400)
Platelets: 211 10*3/uL (ref 150–400)
RBC: 2.62 MIL/uL — ABNORMAL LOW (ref 4.22–5.81)
RBC: 3.43 MIL/uL — ABNORMAL LOW (ref 4.22–5.81)
RBC: 3.32 MIL/uL — ABNORMAL LOW (ref 4.22–5.81)
RDW: 15 % (ref 11.5–15.5)
RDW: 15.5 % (ref 11.5–15.5)
RDW: 15.4 % (ref 11.5–15.5)
WBC: 7.7 10*3/uL (ref 4.0–10.5)
WBC: 16 10*3/uL — ABNORMAL HIGH (ref 4.0–10.5)
WBC: 15.9 10*3/uL — ABNORMAL HIGH (ref 4.0–10.5)
nRBC: 0 % (ref 0.0–0.2)
nRBC: 0 % (ref 0.0–0.2)
nRBC: 0 % (ref 0.0–0.2)

## 2019-05-23 SURGERY — SIGMOIDOSCOPY, FLEXIBLE
Anesthesia: Monitor Anesthesia Care

## 2019-05-23 MED ORDER — LACTATED RINGERS IV BOLUS
1000.0000 mL | Freq: Once | INTRAVENOUS | Status: AC
  Administered 2019-05-23: 1000 mL via INTRAVENOUS

## 2019-05-23 MED ORDER — PROPOFOL 10 MG/ML IV BOLUS
INTRAVENOUS | Status: DC | PRN
Start: 2019-05-23 — End: 2019-05-23
  Administered 2019-05-23: 20 mg via INTRAVENOUS

## 2019-05-23 MED ORDER — PHENYLEPHRINE HCL-NACL 10-0.9 MG/250ML-% IV SOLN
INTRAVENOUS | Status: DC | PRN
Start: 2019-05-23 — End: 2019-05-23
  Administered 2019-05-23: 50 ug/min via INTRAVENOUS

## 2019-05-23 MED ORDER — PHENYLEPHRINE 40 MCG/ML (10ML) SYRINGE FOR IV PUSH (FOR BLOOD PRESSURE SUPPORT)
PREFILLED_SYRINGE | INTRAVENOUS | Status: DC | PRN
  Administered 2019-05-23: 160 ug via INTRAVENOUS
  Administered 2019-05-23: 80 ug via INTRAVENOUS
  Administered 2019-05-23: 160 ug via INTRAVENOUS

## 2019-05-23 MED ORDER — SODIUM CHLORIDE 0.9% IV SOLUTION: Freq: Once | INTRAVENOUS | Status: DC

## 2019-05-23 MED ORDER — SODIUM CHLORIDE 0.9 % IV SOLN: INTRAVENOUS | Status: DC

## 2019-05-23 MED ORDER — SODIUM CHLORIDE 0.9 % IV BOLUS
1000.0000 mL | Freq: Once | INTRAVENOUS | Status: AC
  Administered 2019-05-23: 1000 mL via INTRAVENOUS

## 2019-05-23 MED ORDER — VASOPRESSIN 20 UNIT/ML IV SOLN
INTRAVENOUS | Status: DC | PRN
  Administered 2019-05-23: 2 [IU] via INTRAVENOUS

## 2019-05-23 MED ORDER — ONDANSETRON HCL 4 MG/2ML IJ SOLN
4.0000 mg | Freq: Four times a day (QID) | INTRAMUSCULAR | Status: DC | PRN
  Administered 2019-05-23 – 2019-05-25 (×2): 4 mg via INTRAVENOUS
  Filled 2019-05-23: qty 2

## 2019-05-23 MED ORDER — PROPOFOL 500 MG/50ML IV EMUL
INTRAVENOUS | Status: DC | PRN
  Administered 2019-05-23: 150 ug/kg/min via INTRAVENOUS

## 2019-05-23 NOTE — Anesthesia Preprocedure Evaluation (Addendum)
Anesthesia Evaluation  Patient identified by MRN, date of birth, ID band Patient awake    Reviewed: Allergy & Precautions, NPO status , Patient's Chart, lab work & pertinent test results  History of Anesthesia Complications Negative for: history of anesthetic complications  Airway Mallampati: II  TM Distance: >3 FB Neck ROM: Full    Dental  (+) Dental Advisory Given   Pulmonary Current Smoker ("quit" 2 weeks ago) and Patient abstained from smoking.,    Pulmonary exam normal        Cardiovascular hypertension, Pt. on medications + Peripheral Vascular Disease  Normal cardiovascular exam   '17 Carotid US - 1-39% b/l ICAS  '17 TTE - mild LVH. EF 65% to 70%. No significant valvulopathy     Neuro/Psych PSYCHIATRIC DISORDERS Dementia  B/l hearing loss  TIACVA (left hemiparesis), Residual Symptoms    GI/Hepatic Neg liver ROS,  GIB    Endo/Other  diabetes, Type 2, Oral Hypoglycemic Agents  Renal/GU CRFRenal disease     Musculoskeletal  (+) Arthritis , Osteoarthritis,    Abdominal   Peds  Hematology  (+) anemia ,   Anesthesia Other Findings Covid neg 5/13   Reproductive/Obstetrics                            Anesthesia Physical Anesthesia Plan  ASA: III  Anesthesia Plan: MAC   Post-op Pain Management:    Induction: Intravenous  PONV Risk Score and Plan: 1 and Propofol infusion and Treatment may vary due to age or medical condition  Airway Management Planned: Nasal Cannula and Natural Airway  Additional Equipment: None  Intra-op Plan:   Post-operative Plan:   Informed Consent: I have reviewed the patients History and Physical, chart, labs and discussed the procedure including the risks, benefits and alternatives for the proposed anesthesia with the patient or authorized representative who has indicated his/her understanding and acceptance.       Plan Discussed with: CRNA  and Anesthesiologist  Anesthesia Plan Comments:        Anesthesia Quick Evaluation

## 2019-05-23 NOTE — H&P (View-Only) (Signed)
Patient ID: Billy Torres, male   DOB: [birth year], 64 y.o.   MRN: [id number]    Progress Note   Subjective    Asked to reevaluate patient this morning after acute onset of recurrent bleeding last evening.  Patient had 2 bloody bowel movements, the last was about 3 AM.  He had associated hypotension and tachycardia.  He required 2 fluid boluses.  Hemoglobin 9.2 yesterday, 9.2 early this a.m. and then down to 7.2 this morning.  He has received 1 unit of packed RBCs, currently blood pressure 100/50, pulse in the 90s. Patient has no complaints of abdominal or rectal discomfort.  Patient had undergone colonoscopy on 05/21/2019 for acute lower GI bleeding.  He was found to have a 35 mm cecal polyp which was biopsied but not removed, also 6 mm sessile polyp in the ascending colon not removed and had a 5 mm ulcer in the distal rectum which was not actively bleeding but noted small flat red spot.  No diverticulosis mentioned.   Patient has been resumed on aspirin, Plavix still on hold.       Objective   Vital signs in last 24 hours: Temp:  [98.7 F (37.1 C)-99.8 F (37.7 C)] 99.4 F (37.4 C) (05/16 0923) Pulse Rate:  [77-112] 101 (05/16 0923) Resp:  [17-22] 20 (05/16 0923) BP: (89-114)/(52-70) 97/52 (05/16 1000) SpO2:  [93 %-100 %] 100 % (05/16 0923) Last BM Date: 05/22/19 General: older male in NAD-pale  Heart:  Regular rate and rhythm; no murmurs Lungs: Respirations even and unlabored, lungs CTA bilaterally Abdomen:  Soft, nontender and nondistended. Normal bowel sounds. Extremities:  Without edema. Neurologic:  Alert and oriented,  grossly normal neurologically. Psych:  Cooperative. Normal mood and affect.  Intake/Output from previous day: 05/15 0701 - 05/16 0700 In: 3080 [P.O.:1080; IV Piggyback:2000] Out: 2100 [Urine:2100] Intake/Output this shift: No intake/output data recorded.  Lab Results: Recent Labs    05/22/19 1954 05/23/19 0304 05/23/19 0726  WBC 10.8* 8.3  7.7  HGB 9.2* 9.2* 7.2*  HCT 28.6* 28.8* 22.7*  PLT 307 318 274   BMET Recent Labs    05/22/19 0352 05/23/19 0304  NA 136 136  K 4.3 3.6  CL 106 104  CO2 22 24  GLUCOSE 123* 155*  BUN 10 9  CREATININE 0.83 0.78  CALCIUM 9.0 8.6*   LFT No results for input(s): PROT, ALBUMIN, AST, ALT, ALKPHOS, BILITOT, BILIDIR, IBILI in the last 72 hours. PT/INR No results for input(s): LABPROT, INR in the last 72 hours.  Studies/Results: No results found.     Assessment / Plan:    #34  64 year old male admitted with acute lower GI bleeding.  He had undergone colonoscopy on 05/21/2019 with finding of a 5 mm distal rectal ulcer without active bleeding with a flat red spot.  Also had 2 polyps which were not removed, no diverticulosis noted. Bleeding had subsided, however had recurrent bloody stools last night associated with hypotension and tachycardia.  No further bleeding over the past 6 to 7 hours.  Patient has responded to fluid boluses and 1 unit of packed RBCs for hemoglobin down to 7.2.  Suspect he is bleeding from the stercoral ulcer.  #2 anemia secondary to acute blood loss #3 large cecal polyp and small ascending colon polyp not removed, path pending  #4 history of MCA CVA 2015-had been on chronic Plavix and aspirin #5 history of dementia #6 adult onset diabetes mellitus #7 prostate cancer 8.  History of hypertension  Plan;  keep n.p.o. Patient needs to get second unit of blood hanging prior to proceeding with flexible sigmoidoscopy today with Dr. Loletha Carrow. Procedure was discussed with patient including risks and benefits and he is agreeable to proceed.  This will be done none prepped.  Continue every 6 hour hemoglobins and transfuse to keep hemoglobin closer to 8. Hold aspirin and Plavix         Active Problems:   Acute lower GI bleeding   Lower GI bleed   Pressure injury of skin     LOS: 3 days   Amy Esterwood PA-C 05/23/2019, 10:45 AM   Recurrent bleeding.   ? Rectal ulcer vs left sided diverticulosis seen on recent colonoscopy.   Sigmoidoscopy now.  He is agreeable.  - H. Loletha Carrow, MD

## 2019-05-23 NOTE — Progress Notes (Signed)
Paged that the patient had another bloody bowel movement and is now hypotensive and tachycardic. Asymptomatic otherwise. Give 1L LR bolus with improvement in his blood pressure. Given additional 1L bolus (2L total) and checking stat CBC.   Billy Homes, MD

## 2019-05-23 NOTE — Progress Notes (Signed)
Cardiac Monitoring initiated on pt with second verifier. ST 110s.

## 2019-05-23 NOTE — Progress Notes (Signed)
   05/23/19 0545  Assess: MEWS Score  Temp 98.7 F (37.1 C)  BP (!) 89/55  Pulse Rate (!) 101  Resp 17  Level of Consciousness Alert  SpO2 95 %  O2 Device Room Air  Assess: MEWS Score  MEWS Temp 0  MEWS Systolic 1  MEWS Pulse 1  MEWS RR 0  MEWS LOC 0  MEWS Score 2  MEWS Score Color Yellow  Assess: if the MEWS score is Yellow or Red  Were vital signs taken at a resting state? Yes  Focused Assessment Documented focused assessment  Early Detection of Sepsis Score *See Row Information* Low  MEWS guidelines implemented *See Row Information* Yes  Treat  MEWS Interventions Administered prn meds/treatments;Escalated (See documentation below)  Take Vital Signs  Increase Vital Sign Frequency  Yellow: Q 2hr X 2 then Q 4hr X 2, if remains yellow, continue Q 4hrs  Escalate  MEWS: Escalate Yellow: discuss with charge nurse/RN and consider discussing with provider and RRT  Notify: Charge Nurse/RN  Name of Charge Nurse/RN Notified Ivor Costa, RN  Date Charge Nurse/RN Notified 05/23/19  Time Charge Nurse/RN Notified [phone]  Notify: Provider  Provider Name/Title Madalyn Rob  Date Provider Notified 05/23/19  Time Provider Notified [phone]  Notification Type Page  Notification Reason Change in status  Response See new orders  Date of Provider Response 05/23/19  Time of Provider Response [phone]  Document  Patient Outcome Other (Comment) (BP went up to 91/55 but HR was 112)  Progress note created (see row info) Yes

## 2019-05-23 NOTE — Progress Notes (Signed)
ASA D/C'd. Second bolus is in. Vitals being taken. Trying to call Blood bank to see if blood ready but no answer at present, will try again.

## 2019-05-23 NOTE — Progress Notes (Addendum)
Brief History:  Billy Oxendineis a 64 year old malewith hemiplegia s/p CVA, dementia, HTN, DM, prostate cancer, urinary retention, and HLDpresented to the emergency department after having a bloodied bowel movement and subsequently admitted for lower GI bleed with symptomatic anemia.  Subjective:  Patient was evaluated and examined at bedside this morning. Patient was supine in bed and in no apparent distress. He denied any headache, dizziness, lightheadedness, confusion, changes in vision, or chest pain. Patient was alert and oriented. He reports that he has had another episode of hematochezia this morning.  Objective:  Vital signs in last 24 hours: Vitals:   05/22/19 2017 05/23/19 0545 05/23/19 0627 05/23/19 0811  BP: 100/61 (!) 89/55 (!) 91/55 (!) 105/53  Pulse: 78 (!) 101 (!) 112 (!) 102  Resp: 18 17 17 20   Temp: 99.1 F (37.3 C) 98.7 F (37.1 C) 98.7 F (37.1 C) 98.8 F (37.1 C)  TempSrc: Oral Oral Oral Oral  SpO2: 99% 95% 95% 93%  Weight:      Height:       Weight change:   Intake/Output Summary (Last 24 hours) at 05/23/2019 0838 Last data filed at 05/23/2019 [id number] Gross per 24 hour  Intake 2840 ml  Output 2100 ml  Net 740 ml   Physical Exam Constitutional:      General: He is not in acute distress.    Appearance: He is normal weight.  Cardiovascular: S1, normal. S2, normal. Regular rate and regular rhythm. II/VI holosystolic murmur. No rubs or gallops. Eyes: Pale conjunctiva. Pulmonary: Lungs clear to ausculation, bilaterally. Pulmonary effort is normal. No respiratory distress.  Abdominal: Soft, non-distended. No tenderness to palpation. Normoactive bowel sounds appreciated.  Skin: Skin is warm and dry. Neurological: He is alert and oriented to person, place, and time. Mental status is at baseline.  Psychiatric: Mood and affect appropriate.    Assessment/Plan:  Active Problems:   Acute lower GI bleeding   Lower GI bleed   Pressure injury of  skin  Billy Oxendineis a 64 year old malewith hemiplegia s/p CVA, dementia, HTN, DM, prostate cancer, urinary retention, and HLDpresented to the emergency department after having a bloodied bowel movement and subsequently admitted for lower GI bleed with symptomatic anemia. Patient was admitted to the ICU on 5/13 for concern of hypovolemic shock and transferred back to the floor on 5/15.   # Acute lower GI bleed Colonoscopy on 5/14 showed evidence of small rectal ulcer with stigmata of recent bleed, concerning for stercoral ulcer. Patient had episode of hematochezia last night. CBC was ordered and hemoglobin trended. Patient became hypotensive (BP 89/55 and 91/55) early this morning with associated tachycardia. Patient received two, 1L bolus of fluids and CBC ordered. Most recent CBC revealed hemoglobin 7.2, down from 9.2 earlier this morning. Assessment: Patient's presentation of hypotension and tachycardia in the setting of active acute bleeding is concerning for hypovolemic shock. Bolus fluids have improved BP, and patient is not clinically presenting with concerning signs for shock. Hemoglobin significantly decreasing from 9.2 to 7.2 in four hours warrants transfusion support in the setting of an active bleed.  - Consult GI. - Transfuse RBC. - Fluid resuscitation. - Telemetry. Monitor BP. - Continue to trend hemoglobin. - Holding amlodipine secondary to hypotension - Holding Plavix and Aspirin. - NPO.  # Hemiplegia s/p CVA # PAD # Hyperlipidemia - Atorvastatin  - Holding Aspirin 81 mg - Holding Plavix  # HTN  Patient is hypotensive. Holding antihypertensive medications at this time.  - Holding Amlodipine 5 mg QD -  Holding Losartan tomorrow   # T2DM  - On metformin as outpatient  - Start SSI   # Hypomagnesemia  - MgS 4 g once today  # AKI (resolved)   Diet: NPO VTE prophylaxis: None (no anticoagulation in setting of bleed and no SCDs with severe PAD) Code  Status: Full code    LOS: 3 days   Billy Torres, Billy Torres, Medical Student 05/23/2019, 8:38 AM    I have seen and examined the patient, and reviewed the daily progress note by Nanetta Batty,, MS 3 and discussed the care of the patient with them. I reviewed and made any corrections that were needed.   Signed:  Asencion Noble, MD 05/23/2019, 7:21 PM

## 2019-05-23 NOTE — Progress Notes (Signed)
Pt received back from Endo s/p flex sig. Pt requesting pain meds.  Cardiac monitoring resumed, SR in the 80s.

## 2019-05-23 NOTE — Progress Notes (Signed)
Pt level of care changed to tele medical.

## 2019-05-23 NOTE — Progress Notes (Signed)
Called and notified Puja, Rapid Response Nurse of pt condition just to be aware of events during the night and present Hgb.

## 2019-05-23 NOTE — Significant Event (Signed)
Rapid Response Event Note  Overview: MEWS - GIB  Initial Focused Assessment: I came by to see the patient this morning at 0930. His nurse had just started his first blood transfusion. Overnight, the patient had another bloody BM coupled with soft BPs and mild tachycardia - patient was given a total of 2L LR and labs were drawn - H/H dropped from 9.2/28.8 to 7.2/22.7.   Upon my arrival, patient was awake and oriented, soft spoken but pleasant, denied pain and denied any other symptoms. Appears quite fatigued and skin is quite pale but warm - good palpable pulses. Lung sound clear throughout.   VS: BP 90/77 (86) HR 102 100% on RA RR 16 - not in acute distress.   Interventions: -- Placed 20 PIV LAC - GBR  Plan of Care: -- Increase rate on blood transfusion - patient denies having any heart failure and/or respiratory disease.  -- Avoid crystalloid resuscitation if possible - given that he mentation is well and he is not in distress -- use blood products for resuscitation. If patient becomes overtly unstable - emergent blood products can be used.  -- Monitor VS and trend VS  -- Strict I/Os  -- Monitor for signs of worsening shock -- decreased LOC or AMS, increased HR, poor skin color/temp or decreased capillary refill  Event Summary:  Start Time 0930 End Time 1010  Billy Torres R

## 2019-05-23 NOTE — Anesthesia Postprocedure Evaluation (Signed)
Anesthesia Post Note  Patient: Billy Torres  Procedure(s) Performed: FLEXIBLE SIGMOIDOSCOPY (N/A ) HEMOSTASIS CLIP PLACEMENT     Patient location during evaluation: PACU Anesthesia Type: MAC Level of consciousness: awake and alert Pain management: pain level controlled Vital Signs Assessment: post-procedure vital signs reviewed and stable Respiratory status: spontaneous breathing, nonlabored ventilation and respiratory function stable Cardiovascular status: stable and blood pressure returned to baseline Anesthetic complications: no    Last Vitals:  Vitals:   05/23/19 1435 05/23/19 1440  BP:  (!) 107/50  Pulse: 79 80  Resp: (!) 21 (!) 21  Temp:    SpO2: 100% 100%    Last Pain:  Vitals:   05/23/19 1440  TempSrc:   PainSc: [place]

## 2019-05-23 NOTE — Anesthesia Procedure Notes (Signed)
Procedure Name: MAC Date/Time: 05/23/2019 2:00 PM Performed by: Amadeo Garnet, CRNA Pre-anesthesia Checklist: Patient identified, Emergency Drugs available, Suction available and Patient being monitored Patient Re-evaluated:Patient Re-evaluated prior to induction Oxygen Delivery Method: Nasal cannula Preoxygenation: Pre-oxygenation with 100% oxygen Induction Type: IV induction Placement Confirmation: positive ETCO2 Dental Injury: Teeth and Oropharynx as per pre-operative assessment

## 2019-05-23 NOTE — Progress Notes (Signed)
Full report given to Endo RN.

## 2019-05-23 NOTE — Progress Notes (Signed)
Pt's vomited x 1 and had a large bloody BM, BP taken was 89/55 HR at 101. Notified MD on call. Zofran given and 1 Liter of fluid bolus given. Recheck done, BP went up to 91/55 but HR was 112. Pt denies pain or dizziness. Notified MD on call. Another 1 Liter of fluid bolus given and repeat CBC check ordered.  Will continue to monitor pt.

## 2019-05-23 NOTE — Progress Notes (Signed)
Notified IM resident of Hgb which is now 7.2 from 9.2 and asked about [place] the ASA 81 mg.

## 2019-05-23 NOTE — Transfer of Care (Signed)
Immediate Anesthesia Transfer of Care Note  Patient: Billy Torres  Procedure(s) Performed: FLEXIBLE SIGMOIDOSCOPY (N/A ) HEMOSTASIS CLIP PLACEMENT  Patient Location: Endoscopy Unit  Anesthesia Type:MAC  Level of Consciousness: awake, alert  and oriented  Airway & Oxygen Therapy: Patient Spontanous Breathing and Patient connected to nasal cannula oxygen  Post-op Assessment: Report given to RN, Post -op Vital signs reviewed and stable and Patient moving all extremities  Post vital signs: Reviewed and stable  Last Vitals:  Vitals Value Taken Time  BP    Temp    Pulse 78 05/23/19 1425  Resp 21 05/23/19 1425  SpO2 100 % 05/23/19 1425  Vitals shown include unvalidated device data.  Last Pain:  Vitals:   05/23/19 1345  TempSrc:   PainSc: 0-No pain         Complications: No apparent anesthesia complications

## 2019-05-23 NOTE — Interval H&P Note (Signed)
History and Physical Interval Note:  05/23/2019 1:56 PM  Billy Torres  has presented today for surgery, with the diagnosis of GI bleed.  The various methods of treatment have been discussed with the patient and family. After consideration of risks, benefits and other options for treatment, the patient has consented to  Procedure(s): FLEXIBLE SIGMOIDOSCOPY (N/A) as a surgical intervention.  The patient's history has been reviewed, patient examined, no change in status, stable for surgery.  I have reviewed the patient's chart and labs.  Questions were answered to the patient's satisfaction.     Nelida Meuse III

## 2019-05-23 NOTE — Progress Notes (Signed)
Dr. Sherry Ruffing updated on plan of care and pt condition. Blood infusing and pt color slightly better.

## 2019-05-23 NOTE — Op Note (Signed)
[facility] Patient Name: Billy Torres Procedure Date : 05/23/2019 MRN: [id number] Attending MD: Estill Cotta. Loletha Carrow , MD Date of Birth: January 23, 1955 CSN: [id number] Age: 64 Admit Type: Inpatient Procedure:                Flexible Sigmoidoscopy Indications:              Hematochezia, acute blood loss anemia (rectal ulcer                            and sigmoid diverticulosis fuond 2 days prior -                            ulcer felt likely to have been the bleeding source) Providers:                Estill Cotta. Loletha Carrow, MD, Benetta Spar RN, RN, Laverda Sorenson, Technician, Silas Flood, CRNA Referring MD:             Medical Teaching Service Medicines:                Monitored Anesthesia Care Complications:            No immediate complications. Estimated Blood Loss:     Estimated blood loss: none. Procedure:                Pre-Anesthesia Assessment:                           - Prior to the procedure, a History and Physical                            was performed, and patient medications and                            allergies were reviewed. The patient's tolerance of                            previous anesthesia was also reviewed. The risks                            and benefits of the procedure and the sedation                            options and risks were discussed with the patient.                            All questions were answered, and informed consent                            was obtained. Prior Anticoagulants: The patient has                            taken Plavix (clopidogrel), last dose was 4 days  prior to procedure. ASA Grade Assessment: IV - A                            patient with severe systemic disease that is a                            constant threat to life. After reviewing the risks                            and benefits, the patient was deemed in                            satisfactory  condition to undergo the procedure.                           After obtaining informed consent, the scope was                            passed under direct vision. The PCF-H190DL                            ([id number]) Olympus pediatric colonoscope was                            introduced through the anus and advanced to the the                            left transverse colon. The flexible sigmoidoscopy                            was accomplished without difficulty. The patient                            tolerated the procedure, developing hypotension at                            the end of the 5 minute procedure, treated by                            anesthesia with IVF and pressors. The quality of                            the bowel preparation was fair (no prep given tody                            - had full bowel prep for colonoscopy 2 days ago). Scope In: 2:03:13 PM Scope Out: 2:08:51 PM Total Procedure Duration: 0 hours 5 minutes 38 seconds  Findings:      The perianal and digital rectal examinations were normal.      A few small-mouthed diverticula were found in the sigmoid colon.      A single (solitary) four mm ulcer was found in the distal rectum.       (appeared a little smaller than 2  days ago) No bleeding was present.       Stigmata of recent bleeding were present. For hemostasis, one hemostatic       clip was successfully placed (MR conditional). There was no bleeding at       the end of the procedure.      A 6 mm polyp was found in the distal rectum. The polyp was sessile. Impression:               - Preparation of the colon was fair.                           - Diverticulosis in the sigmoid colon.                           - A single (solitary) ulcer in the distal rectum.                            Clip (MR conditional) was placed.                           - One 6 mm polyp in the distal rectum.                           - No specimens collected.                            RECTAL ULCER BELIEVED TO HAVE BEEN THE SOURCE OF                            BLEEDING ON BOTH OCCASIONS. If diverticulosis had                            bled this morning, there would have been blood in                            the colon since no bowel preparation was given. Moderate Sedation:      MAC sedation used Recommendation:           - Return patient to hospital ward for ongoing care.                           - Full liquid diet.                           - CBC Q 12 hrs x 4                           GI service to see tomorrow Procedure Code(s):        --- Professional ---                           [phone], Sigmoidoscopy, flexible; with control of                            bleeding, any method Diagnosis Code(s):        ---  Professional ---                           K62.6, Ulcer of anus and rectum                           K62.1, Rectal polyp                           K92.1, Melena (includes Hematochezia)                           K57.30, Diverticulosis of large intestine without                            perforation or abscess without bleeding CPT copyright 2019 American Medical Association. All rights reserved. The codes documented in this report are preliminary and upon coder review may  be revised to meet current compliance requirements. Ethelyn Cerniglia L. Loletha Carrow, MD 05/23/2019 2:30:49 PM This report has been signed electronically. Number of Addenda: 0

## 2019-05-23 NOTE — Progress Notes (Addendum)
Patient ID: Billy Torres, male   DOB: [birth year], 64 y.o.   MRN: [id number]    Progress Note   Subjective    Asked to reevaluate patient this morning after acute onset of recurrent bleeding last evening.  Patient had 2 bloody bowel movements, the last was about 3 AM.  He had associated hypotension and tachycardia.  He required 2 fluid boluses.  Hemoglobin 9.2 yesterday, 9.2 early this a.m. and then down to 7.2 this morning.  He has received 1 unit of packed RBCs, currently blood pressure 100/50, pulse in the 90s. Patient has no complaints of abdominal or rectal discomfort.  Patient had undergone colonoscopy on 05/21/2019 for acute lower GI bleeding.  He was found to have a 35 mm cecal polyp which was biopsied but not removed, also 6 mm sessile polyp in the ascending colon not removed and had a 5 mm ulcer in the distal rectum which was not actively bleeding but noted small flat red spot.  No diverticulosis mentioned.   Patient has been resumed on aspirin, Plavix still on hold.       Objective   Vital signs in last 24 hours: Temp:  [98.7 F (37.1 C)-99.8 F (37.7 C)] 99.4 F (37.4 C) (05/16 0923) Pulse Rate:  [77-112] 101 (05/16 0923) Resp:  [17-22] 20 (05/16 0923) BP: (89-114)/(52-70) 97/52 (05/16 1000) SpO2:  [93 %-100 %] 100 % (05/16 0923) Last BM Date: 05/22/19 General: older male in NAD-pale  Heart:  Regular rate and rhythm; no murmurs Lungs: Respirations even and unlabored, lungs CTA bilaterally Abdomen:  Soft, nontender and nondistended. Normal bowel sounds. Extremities:  Without edema. Neurologic:  Alert and oriented,  grossly normal neurologically. Psych:  Cooperative. Normal mood and affect.  Intake/Output from previous day: 05/15 0701 - 05/16 0700 In: 3080 [P.O.:1080; IV Piggyback:2000] Out: 2100 [Urine:2100] Intake/Output this shift: No intake/output data recorded.  Lab Results: Recent Labs    05/22/19 1954 05/23/19 0304 05/23/19 0726  WBC 10.8* 8.3  7.7  HGB 9.2* 9.2* 7.2*  HCT 28.6* 28.8* 22.7*  PLT 307 318 274   BMET Recent Labs    05/22/19 0352 05/23/19 0304  NA 136 136  K 4.3 3.6  CL 106 104  CO2 22 24  GLUCOSE 123* 155*  BUN 10 9  CREATININE 0.83 0.78  CALCIUM 9.0 8.6*   LFT No results for input(s): PROT, ALBUMIN, AST, ALT, ALKPHOS, BILITOT, BILIDIR, IBILI in the last 72 hours. PT/INR No results for input(s): LABPROT, INR in the last 72 hours.  Studies/Results: No results found.     Assessment / Plan:    #43  64 year old male admitted with acute lower GI bleeding.  He had undergone colonoscopy on 05/21/2019 with finding of a 5 mm distal rectal ulcer without active bleeding with a flat red spot.  Also had 2 polyps which were not removed, no diverticulosis noted. Bleeding had subsided, however had recurrent bloody stools last night associated with hypotension and tachycardia.  No further bleeding over the past 6 to 7 hours.  Patient has responded to fluid boluses and 1 unit of packed RBCs for hemoglobin down to 7.2.  Suspect he is bleeding from the stercoral ulcer.  #2 anemia secondary to acute blood loss #3 large cecal polyp and small ascending colon polyp not removed, path pending  #4 history of MCA CVA 2015-had been on chronic Plavix and aspirin #5 history of dementia #6 adult onset diabetes mellitus #7 prostate cancer 8.  History of hypertension  Plan;  keep n.p.o. Patient needs to get second unit of blood hanging prior to proceeding with flexible sigmoidoscopy today with Dr. Loletha Carrow. Procedure was discussed with patient including risks and benefits and he is agreeable to proceed.  This will be done none prepped.  Continue every 6 hour hemoglobins and transfuse to keep hemoglobin closer to 8. Hold aspirin and Plavix         Active Problems:   Acute lower GI bleeding   Lower GI bleed   Pressure injury of skin     LOS: 3 days   Amy Esterwood PA-C 05/23/2019, 10:45 AM   Recurrent bleeding.   ? Rectal ulcer vs left sided diverticulosis seen on recent colonoscopy.   Sigmoidoscopy now.  He is agreeable.  - H. Loletha Carrow, MD

## 2019-05-23 NOTE — Progress Notes (Signed)
BP now 100/56. Pt states he feels a little better. 1st unit in.

## 2019-05-23 NOTE — Progress Notes (Signed)
Pt down to Endo via bed.

## 2019-05-24 DIAGNOSIS — K625 Hemorrhage of anus and rectum: Secondary | ICD-10-CM

## 2019-05-24 LAB — TYPE AND SCREEN
ABO/RH(D): O POS
Antibody Screen: NEGATIVE
Unit division: 0
Unit division: 0
Unit division: 0
Unit division: 0

## 2019-05-24 LAB — CBC
HCT: 27.3 % — ABNORMAL LOW (ref 39.0–52.0)
HCT: 26.4 % — ABNORMAL LOW (ref 39.0–52.0)
Hemoglobin: 8.8 g/dL — ABNORMAL LOW (ref 13.0–17.0)
Hemoglobin: 8.5 g/dL — ABNORMAL LOW (ref 13.0–17.0)
MCH: 27.5 pg (ref 26.0–34.0)
MCH: 27.5 pg (ref 26.0–34.0)
MCHC: 32.2 g/dL (ref 30.0–36.0)
MCHC: 32.2 g/dL (ref 30.0–36.0)
MCV: 85.3 fL (ref 80.0–100.0)
MCV: 85.4 fL (ref 80.0–100.0)
Platelets: 219 10*3/uL (ref 150–400)
Platelets: 204 10*3/uL (ref 150–400)
RBC: 3.2 MIL/uL — ABNORMAL LOW (ref 4.22–5.81)
RBC: 3.09 MIL/uL — ABNORMAL LOW (ref 4.22–5.81)
RDW: 15.8 % — ABNORMAL HIGH (ref 11.5–15.5)
RDW: 16 % — ABNORMAL HIGH (ref 11.5–15.5)
WBC: 11 10*3/uL — ABNORMAL HIGH (ref 4.0–10.5)
WBC: 10 10*3/uL (ref 4.0–10.5)
nRBC: 0 % (ref 0.0–0.2)
nRBC: 0 % (ref 0.0–0.2)

## 2019-05-24 LAB — PREPARE RBC (CROSSMATCH)

## 2019-05-24 LAB — BPAM RBC
Blood Product Expiration Date: 202106142359
Blood Product Expiration Date: 202106142359
Blood Product Expiration Date: 202106142359
Blood Product Expiration Date: 202106172359
ISSUE DATE / TIME: 202105131148
ISSUE DATE / TIME: 202105131148
ISSUE DATE / TIME: 202105160855
ISSUE DATE / TIME: 202105161112
Unit Type and Rh: 5100
Unit Type and Rh: 5100
Unit Type and Rh: 5100
Unit Type and Rh: 5100

## 2019-05-24 LAB — MAGNESIUM: Magnesium: 1.3 mg/dL — ABNORMAL LOW (ref 1.7–2.4)

## 2019-05-24 LAB — GLUCOSE, CAPILLARY
Glucose-Capillary: 127 mg/dL — ABNORMAL HIGH (ref 70–99)
Glucose-Capillary: 213 mg/dL — ABNORMAL HIGH (ref 70–99)
Glucose-Capillary: 136 mg/dL — ABNORMAL HIGH (ref 70–99)

## 2019-05-24 LAB — SURGICAL PATHOLOGY

## 2019-05-24 MED ORDER — LOPERAMIDE HCL 2 MG PO CAPS
2.0000 mg | ORAL_CAPSULE | Freq: Once | ORAL | Status: DC
  Filled 2019-05-24: qty 1

## 2019-05-24 MED ORDER — PSYLLIUM 95 % PO PACK
1.0000 | PACK | Freq: Every day | ORAL | Status: DC
  Administered 2019-05-24 – 2019-05-28 (×4): 1 via ORAL
  Filled 2019-05-24 (×4): qty 1

## 2019-05-24 NOTE — Progress Notes (Addendum)
Daily Rounding Note  05/24/2019, 12:07 PM  LOS: 4 days   SUBJECTIVE:   Chief complaint: Significant GI hemorrhage from stercoral ulcer.  Blood loss anemia. Patient now having watery yellow stools and ask for Imodium which the resident prescribed.  I was in the room with the nurse and asked her not to give this medication The orders for Senokot and daily MiraLAX are discontinued and these have not been given in the last 4 days.  OBJECTIVE:         Vital signs in last 24 hours:    Temp:  [98.5 F (36.9 C)-100.9 F (38.3 C)] 99.1 F (37.3 C) (05/17 1022) Pulse Rate:  [75-101] 75 (05/17 1022) Resp:  [17-22] 18 (05/17 1022) BP: (91-130)/(49-70) 115/67 (05/17 1022) SpO2:  [97 %-100 %] 100 % (05/17 1022) Last BM Date: 05/24/19 Filed Weights   05/20/19 0857 05/21/19 0500 05/21/19 1230  Weight: 59.9 kg 59.5 kg 59.5 kg   General: Looks well.  Comfortable. Heart: RRR Chest: Clear bilaterally without labored breathing or cough. Abdomen: Nontender, nondistended, active bowel sounds.  Some yellow watery material on the bed sheets representing stool. Extremities: No CCE. Neuro/Psych: Alert.  Appropriate.  Speech is slow but accurate and no memory problems  Intake/Output from previous day: 05/16 0701 - 05/17 0700 In: 2090.3 [P.O.:1080; I.V.:400; Blood:610.3] Out: -   Intake/Output this shift: No intake/output data recorded.  Lab Results: Recent Labs    05/23/19 1539 05/23/19 1647 05/24/19 0527  WBC 16.0* 15.9* 11.0*  HGB 9.3* 9.1* 8.8*  HCT 29.0* 27.8* 27.3*  PLT 205 211 219   BMET Recent Labs    05/22/19 0352 05/23/19 0304  NA 136 136  K 4.3 3.6  CL 106 104  CO2 22 24  GLUCOSE 123* 155*  BUN 10 9  CREATININE 0.83 0.78  CALCIUM 9.0 8.6*   LFT No results for input(s): PROT, ALBUMIN, AST, ALT, ALKPHOS, BILITOT, BILIDIR, IBILI in the last 72 hours. PT/INR No results for input(s): LABPROT, INR in the last 72  hours. Hepatitis Panel No results for input(s): HEPBSAG, HCVAB, HEPAIGM, HEPBIGM in the last 72 hours.  Studies/Results: No results found.  ASSESMENT:   *   Hematochezia with massive hemorrhaging.. 05/20/2019 CTAP angiography.  No active bleeding.  Wall thickening, prominent mucosa in rectum.  Arthrosis sclerosis involving distal abdominal aorta with small amount ulcerative plaque.  No AAA.  Main visceral arteries patent, occluded right hypogastric artery.  Stenosis in bilateral proximal SFAs. 05/21/2019 colonoscopy: 3 polyps encountered, 3.5 cm polyp in the cecum was biopsied, 6 mm polyp and a sending and another 6 mm polyp in rectum were left in place.  Solitary ulcer in distal rectum cw stercoral ulcer, this was not actively bleeding but was felt to be the source of recent significant bleeding. Bleeding stopped then restarted PM 05/22/19.   05/23/2019 flex sig with nonbleeding, 4 mm rectal ulcer (looked a little smaller than 2 days prior).  Endo Clip placed.  6 mm sessile distal rectal polyp was not removed.  *    ABL anemia.   Hgb 10.7 >> 7.2 >> 8.8.  S/p 4 PRBCs  *   Chronic Plavix/81 ASA, on hold. Indication is CVA 2015, 2017 and ASPVD.   Dr Loletha Carrow rec holding for 14 days to allow for ulcer to heal.    PLAN   *  Restart ASA/Plavix 5/30  *    Will add Citrucel daily which may help  with his watery stool but not bind him up.  Would not use antidiarrheals in a patient whose had bleeding stercoral ulcer  *    Advance from full liquids to him heart healthy diet  *    GI signing off.    Azucena Freed  05/24/2019, 12:07 PM Phone [phone]   ________________________________________________________________________  Velora Heckler GI MD note:  I personally examined the patient, reviewed the data and agree with the assessment and plan described above. Please call or page with any questions or concerns.   Owens Loffler, MD [facility] Pager [phone]

## 2019-05-24 NOTE — TOC Initial Note (Signed)
Transition of Care [facility]) - Initial/Assessment Note    Patient Details  Name: Billy Torres MRN: [id number] Date of Birth: [birth year]  Transition of Care [facility]) CM/SW Contact:    Emeterio Reeve, [place] Phone Number: 05/24/2019, 1:43 PM  Clinical Narrative:                  CSW met with pt at bedside. CSW introduced self and explained her role at the hospital. Pt stated PTA he was a facility resident at [facility]. Pt stated that he plans on returning there and continue rehab. Pt stated he was using a wheelchair before going to [place] and was living with family.   PT/OT recommendations are for SNF. Pt states he will return to [facility] upon discharge.   Expected Discharge Plan: Skilled Nursing Facility Barriers to Discharge: Continued Medical Work up   Patient Goals and CMS Choice Patient states their goals for this hospitalization and ongoing recovery are:: To return to [facility] [employer]gov Compare Post Acute Care list provided to:: Patient Choice offered to / list presented to : Patient  Expected Discharge Plan and Services Expected Discharge Plan: [place] In-house Referral: Clinical Social Work Discharge Planning Services: CM Consult Post Acute Care Choice: [place] arrangements for the past 2 months: [place], [place]                                      Prior Living Arrangements/Services Living arrangements for the past 2 months: [place], [place] Lives with:: Facility Resident Patient language and need for interpreter reviewed:: Yes Do you feel safe going back to the place where you live?: Yes      Need for Family Participation in Patient Care: Yes (Comment) Care giver support system in place?: No (comment)   Criminal Activity/Legal Involvement Pertinent to Current Situation/Hospitalization: No - Comment as needed  Activities of Daily Living Home  Assistive Devices/Equipment: CBG Meter, Cane (specify quad or straight), Dentures (specify type), Wheelchair ADL Screening (condition at time of admission) Patient's cognitive ability adequate to safely complete daily activities?: Yes Is the patient deaf or have difficulty hearing?: Yes Does the patient have difficulty seeing, even when wearing glasses/contacts?: No Does the patient have difficulty concentrating, remembering, or making decisions?: No Patient able to express need for assistance with ADLs?: Yes Does the patient have difficulty dressing or bathing?: Yes Independently performs ADLs?: No Communication: Independent Dressing (OT): Independent Grooming: Independent Feeding: Independent Bathing: Needs assistance Is this a change from baseline?: Pre-admission baseline Toileting: Needs assistance Is this a change from baseline?: Pre-admission baseline In/Out Bed: Needs assistance Is this a change from baseline?: Pre-admission baseline Walks in Home: Needs assistance Is this a change from baseline?: Pre-admission baseline Does the patient have difficulty walking or climbing stairs?: Yes Weakness of Legs: Right(L BKA) Weakness of Arms/Hands: None  Permission Sought/Granted Permission sought to share information with : Facility Sport and exercise psychologist, Family Supports Permission granted to share information with : No              Emotional Assessment Appearance:: Appears stated age Attitude/Demeanor/Rapport: Engaged Affect (typically observed): Appropriate Orientation: : Oriented to Self, Oriented to Place, Oriented to  Time, Oriented to Situation   Psych Involvement: No (comment)  Admission diagnosis:  Rectal bleeding [K62.5] Acute lower GI bleeding [K92.2] Weakness [R53.1] Lower GI bleed [K92.2]  Patient Active Problem List   Diagnosis Date Noted  . Pressure injury of skin 05/21/2019  . Acute lower GI bleeding 05/20/2019  . Lower GI bleed 05/20/2019  . Acquired  absence of left leg below knee ([place]) 05/05/2019  . Acute osteomyelitis of left foot ([place]) 04/28/2019  . Cutaneous abscess of left foot   . PVD (peripheral vascular disease) ([place]) 12/11/2018  . Hydronephrosis due to obstruction of bladder   . Acute urinary retention 08/15/2016  . Malignant neoplasm of prostate ([place]) 02/26/2016  . Muscle spasticity   . Dementia ([place])   . Cerebral infarction due to unspecified mechanism   . Leukocytosis   . Acute thrombotic stroke ([place]) 05/17/2015  . Diabetes mellitus due to underlying condition with complication, without long-term current use of insulin ([place])   . Gait disturbance, post-stroke   . Hemiparesis ([place])   . Cognitive deficit due to old cerebral infarction   . Benign essential HTN   . Diabetes mellitus type 2 in nonobese (HCC)   . Essential hypertension   . History of CVA (cerebrovascular accident) without residual deficits   . Ataxia, post-stroke   . Dysarthria, post-stroke   . Tobacco abuse   . HLD (hyperlipidemia)   . Acute CVA (cerebrovascular accident) ([place]) 05/15/2015  . Left-sided weakness 05/14/2015  . Right shoulder pain 03/03/2014  . History of CVA (cerebrovascular accident) 02/28/2014  . Smoking 06/29/2013  . CVA (cerebral infarction) 06/29/2013  . Dyslipidemia 06/29/2013  . TIA (transient ischemic attack) 06/27/2013  . Diabetes ([place]) 06/27/2013   PCP:  Angelica Pou, MD Pharmacy:   [id number] [place]Phone: [phone]: [phone] [id number] [address] [place]hone: [phone]: [phone]   Social Determinants of Health (SDOH) Interventions    Readmission Risk Interventions No flowsheet data found.   MiEmeterio ReeveLCLatanya PresserLCNorristownocial Worker [phone]

## 2019-05-24 NOTE — Progress Notes (Signed)
Brief History:  Billy Oxendineis a 64 year old malewith hemiplegia s/p CVA, dementia, HTN, DM, prostate cancer, urinary retention, and HLDpresented to the emergency department after having a bloodied bowel movementand subsequently admitted for lower GI bleed with symptomatic anemia.  Subjective:  Billy Torres was evaluated and examined this morning bedside. Patient reports that he is feeling "fine." He reports two episodes of diarrhea overnight. When asked about the color of the stool, he states that he did not look. He denies any fevers, chills, lightheadedness, dizziness, confusion, or changes in vision.   Objective:  Vital signs in last 24 hours: Vitals:   05/23/19 1629 05/23/19 2004 05/24/19 0206 05/24/19 0453  BP: (!) 92/56 (!) 91/55 111/70 103/64  Pulse: 84 83 (!) 101 86  Resp: 20 17 18 17   Temp: 100 F (37.8 C) 99.5 F (37.5 C) 98.6 F (37 C) 98.5 F (36.9 C)  TempSrc: Oral Oral    SpO2: 98% 97% 99% 99%  Weight:      Height:       Weight change:   Intake/Output Summary (Last 24 hours) at 05/24/2019 0835 Last data filed at 05/24/2019 0600 Gross per 24 hour  Intake 2090.25 ml  Output --  Net 2090.25 ml   Physical Exam  Constitutional: He is oriented to person, place, and time. No distress.  HENT:  Head: Normocephalic and atraumatic.  Eyes: EOM are normal.  Cardiovascular: Normal rate and regular rhythm. Exam reveals no gallop and no friction rub.  No murmur heard. Pulmonary/Chest: Effort normal and breath sounds normal. No respiratory distress. He has no wheezes. He has no rales.  Abdominal: Soft. Bowel sounds are normal. He exhibits no distension. There is no abdominal tenderness.  Neurological: He is alert and oriented to person, place, and time.  Skin: Skin is warm and dry. He is not diaphoretic.  Psychiatric: Mood and affect normal.      Assessment/Plan:  Active Problems:   Acute lower GI bleeding   Lower GI bleed   Pressure injury of  skin  Billy Oxendineis a 64 year old malewith hemiplegia s/p CVA, dementia, HTN, DM, prostate cancer, urinary retention, and HLDpresented to the emergency department after having a bloodied bowel movementand subsequently admitted for lower GI bleed with symptomatic anemia. Patient was admitted to the ICU on 5/13 for concern of hypovolemic shock and transferred back to the floor on 5/15.  #Acute lower GI bleed # Diarrhea Colonoscopy on 5/14 showed evidence of small rectal ulcer with stigmata of recent bleed, concerning for stercoral ulcer. Patient had another episode of hematochezia two nights ago. Patient was hypotensive and tachycardiac yesterday morning. Hbg trended down to 7.2 in the setting of active bleed warranted transfusion. Received 2 units of PRBC yesterday. Hbg improved to 9.3 yesterday afternoon, is 8.8 this morning. Underwent flexible sigmoidoscopy yesterday and ulcer was clipped (MR conditional). Reported two episodes of diarrhea overnight. Assessment: Patient is no apparent distress and vitals remained stable overnight. Hbg improved after receiving 2 units of PRBC. Confirmed with nursing staff that the diarrheal episodes lacked overt evidence of blood. Patient is currently stable, will need to continue to monitor vitals and repeat CBC to ensure Hbg is consistent. Due to CVA history, would like to restart aspirin 81 mg tomorrow if patient has no more episodes of active bleeding.  Plan: - Repeat CBC for trending Hbg. - Telemetry. Monitor BP. - Heart Healthy Diet (advanced per GI recommendation). - Refer to GI for diarrhea treatment. - Continue to hold amlodipine, patient is normotensive.  Re-evaluate tomorrow, consider reinstating tomorrow. - Continue holding plavix and aspirin.  #Hemiplegia s/p CVA #PAD #Hyperlipidemia - Atorvastatin - Holding Aspirin 81 mg. If he remains stable will restart aspirin tomorrow. - Holding Plavix for 14 days post-bleed. Restart  5/30.  #HTN Patient is currently normotensive. Hesitant to start antihypertensives after hypotensive episodes yesterday. Plan: - Holding Amlodipine 5 mg QD. - Holding Losartan.   # T2DM  - On metformin as outpatient  - Continue SSI  # Hypomagnesemia  Patient presented with hypomagnesia. Was given magnesium. - Check magnesium level.  #AKI(resolved)   Diet: Heart Healthy Diet VTE prophylaxis:None (no anticoagulation in setting of bleed and no SCDs with severe PAD) Code Status: Full code      LOS: 4 days   Nanetta Batty, Medical Student 05/24/2019, 8:35 AM

## 2019-05-24 NOTE — TOC Progression Note (Signed)
Transition of Care [facility]) - Progression Note    Patient Details  Name: Billy Torres MRN: [id number] Date of Birth: [birth year]  Transition of Care [facility]) CM/SW [place], [place] Phone Number: 05/24/2019, 1:30 PM  Clinical Narrative:    CSW spoke with Benita, pt CSW through [facility].  She will follow for return. Pt will need new COVID prior to d/c to [employer].   Expected Discharge Plan: [place] Barriers to Discharge: Continued Medical Work up  Expected Discharge Plan and Services Expected Discharge Plan: [place] In-house Referral: Clinical Social Work Discharge Planning Services: CM Consult Post Acute Care Choice: Resumption of Product/process development scientist, [place] Living arrangements for the past 2 months: [place], [place]     Readmission Risk Interventions No flowsheet data found.

## 2019-05-24 NOTE — Hospital Course (Signed)
  Lower GI Bleed 2/2 Rectal Ulcer Billy Torres presented to the emergency department after having a bloodied bowel movement and subsequently admitted for lower GI bleed with symptomatic anemia. Patient was admitted to the ICU on 5/13 for concern of hypovolemic shock. At the time, he had an abbreviated colonoscopy on 5/14 that showed a single rectal ulcer in the distal rectum with stigmata of recent bleeding. Of note, he also had a 35 mm polyp in the cecum that was biopsied (results pending). Two other polyps noted but not biopsied. He was transferred back to the floor on 5/15 in stable condition without any further BRBPR. On 5/16, patient was noted to have additional blood bowel movements. Flex sigmoidoscopy on 5/16 noted same singular rectal ulcer with stigmata of recent bleeding. Diverticular bleeding was not noted and unlikely to be source.    2. Symptomatic Anemia 2/2 GI bleed Hemoglobin on admission was 10.7, a decreased from 12.9 on 04/28/2019. Patient required 3 units of pRBCs, 1 unit of FFP and 1 unit of platelets on 5/13. He stabilized and did not require any additional blood products until 5/16, at which time patient developed additional bleeding. At the time, his hemoglobin decreased to a minimum of 7.2. He received additional 2 units of pRBCs.

## 2019-05-25 ENCOUNTER — Inpatient Hospital Stay (HOSPITAL_COMMUNITY): Payer: Medicare (Managed Care)

## 2019-05-25 DIAGNOSIS — D62 Acute posthemorrhagic anemia: Secondary | ICD-10-CM | POA: Diagnosis present

## 2019-05-25 DIAGNOSIS — I700 Atherosclerosis of aorta: Secondary | ICD-10-CM | POA: Diagnosis present

## 2019-05-25 DIAGNOSIS — K921 Melena: Secondary | ICD-10-CM | POA: Diagnosis present

## 2019-05-25 LAB — CBC
HCT: 23.2 % — ABNORMAL LOW (ref 39.0–52.0)
HCT: 30.3 % — ABNORMAL LOW (ref 39.0–52.0)
Hemoglobin: 7.4 g/dL — ABNORMAL LOW (ref 13.0–17.0)
Hemoglobin: 10.1 g/dL — ABNORMAL LOW (ref 13.0–17.0)
MCH: 27.4 pg (ref 26.0–34.0)
MCH: 28.4 pg (ref 26.0–34.0)
MCHC: 31.9 g/dL (ref 30.0–36.0)
MCHC: 33.3 g/dL (ref 30.0–36.0)
MCV: 85.9 fL (ref 80.0–100.0)
MCV: 85.1 fL (ref 80.0–100.0)
Platelets: 251 10*3/uL (ref 150–400)
Platelets: 191 10*3/uL (ref 150–400)
RBC: 2.7 MIL/uL — ABNORMAL LOW (ref 4.22–5.81)
RBC: 3.56 MIL/uL — ABNORMAL LOW (ref 4.22–5.81)
RDW: 15.9 % — ABNORMAL HIGH (ref 11.5–15.5)
RDW: 14.6 % (ref 11.5–15.5)
WBC: 9.4 10*3/uL (ref 4.0–10.5)
WBC: 15.7 10*3/uL — ABNORMAL HIGH (ref 4.0–10.5)
nRBC: 0 % (ref 0.0–0.2)
nRBC: 0 % (ref 0.0–0.2)

## 2019-05-25 LAB — PREPARE RBC (CROSSMATCH)

## 2019-05-25 LAB — GLUCOSE, CAPILLARY
Glucose-Capillary: 122 mg/dL — ABNORMAL HIGH (ref 70–99)
Glucose-Capillary: 111 mg/dL — ABNORMAL HIGH (ref 70–99)
Glucose-Capillary: 128 mg/dL — ABNORMAL HIGH (ref 70–99)

## 2019-05-25 MED ORDER — POLYETHYLENE GLYCOL 3350 17 GM/SCOOP PO POWD
0.5000 | Freq: Once | ORAL | Status: DC
  Filled 2019-05-25: qty 255

## 2019-05-25 MED ORDER — MAGNESIUM SULFATE 2 GM/50ML IV SOLN: 2.0000 g | Freq: Once | INTRAVENOUS | Status: DC

## 2019-05-25 MED ORDER — POLYETHYLENE GLYCOL 3350 17 GM/SCOOP PO POWD
0.5000 | Freq: Once | ORAL | Status: AC
  Administered 2019-05-25: 127.5 g via ORAL
  Filled 2019-05-25: qty 255

## 2019-05-25 MED ORDER — MAGNESIUM SULFATE 2 GM/50ML IV SOLN
2.0000 g | Freq: Once | INTRAVENOUS | Status: AC
  Administered 2019-05-25: 2 g via INTRAVENOUS
  Filled 2019-05-25: qty 50

## 2019-05-25 MED ORDER — LACTATED RINGERS IV BOLUS: 1000.0000 mL | Freq: Once | INTRAVENOUS | Status: AC

## 2019-05-25 MED ORDER — TECHNETIUM TC 99M-LABELED RED BLOOD CELLS IV KIT
25.0000 | PACK | Freq: Once | INTRAVENOUS | Status: AC | PRN
  Administered 2019-05-25: 25 via INTRAVENOUS

## 2019-05-25 MED ORDER — LACTATED RINGERS IV BOLUS
1000.0000 mL | Freq: Once | INTRAVENOUS | Status: AC
  Administered 2019-05-25: 1000 mL via INTRAVENOUS

## 2019-05-25 MED ORDER — ORAL CARE MOUTH RINSE
15.0000 mL | Freq: Two times a day (BID) | OROMUCOSAL | Status: DC
  Administered 2019-05-25 – 2019-05-28 (×7): 15 mL via OROMUCOSAL

## 2019-05-25 MED ORDER — SODIUM CHLORIDE 0.9% IV SOLUTION: Freq: Once | INTRAVENOUS | Status: AC

## 2019-05-25 NOTE — Progress Notes (Signed)
Brief History:  Billy Oxendineis a 64 year old malewith hemiplegia s/p CVA, dementia, HTN, DM, prostate cancer, urinary retention, and HLDpresented to the emergency department after having a bloodied bowel movementand subsequently admitted for lower GI bleed with symptomatic anemia.  Subjective:  Billy Torres was evaluated and examined at bedside this morning. Patient denies any fever, chills, dizziness, lightheadedness, changes in vision, or chest pain. Overall, he states that he is feeling "fine," and appears to be in no apparent distress.  Objective:  Vital signs in last 24 hours: Vitals:   05/25/19 0338 05/25/19 0345 05/25/19 0405 05/25/19 0530  BP: 91/61 91/61 (!) 98/55 (!) 107/58  Pulse: 85 84    Resp: 12 17    Temp: 98.2 F (36.8 C) 98.2 F (36.8 C) 98.6 F (37 C) 98.3 F (36.8 C)  TempSrc: Oral Oral Oral Oral  SpO2: 100% 100%    Weight:      Height:       Weight change:   Intake/Output Summary (Last 24 hours) at 05/25/2019 0653 Last data filed at 05/25/2019 0530 Gross per 24 hour  Intake 2127 ml  Output 650 ml  Net 1477 ml   Physical Exam  Constitutional: He is oriented to person, place, and time. No distress.  Pale appearing. No apparent distress.  HENT:  Head: Normocephalic and atraumatic.  Eyes: EOM are normal.  Cardiovascular: Normal rate and regular rhythm. Exam reveals no gallop and no friction rub.  No murmur heard. Pulmonary/Chest: Effort normal and breath sounds normal. No respiratory distress.  Abdominal: Soft. Bowel sounds are normal. He exhibits no distension. There is no abdominal tenderness.  Neurological: He is alert and oriented to person, place, and time.  Skin: Skin is warm and dry. He is not diaphoretic.  Psychiatric: Mood and affect normal.      Assessment/Plan:  Active Problems:   Acute lower GI bleeding   Lower GI bleed   Pressure injury of skin   Rectal bleeding   #Acute lower GI bleed # Diarrhea Patient had another  episode of hematochezia early this morning. At that time, patient was tachycardia, hypotensive, and was pale and diaphoretic on physical exam. Patient received 1L LR bolus, and 2 units of PRBC. Patient underwent NM GI blood loss imaging this morning that was not suggestive of active GI bleeding. GI was re-consulted and is following patient. Assessment: Patient has had multiple episodes of hematochezia with tachycardia and hypotension requiring fluid resuscitation and blood transfusions. Patient's vital signs are currently stable and he does not report any concerning symptoms for hypovolemic shock. Patients most recent (obtained after transfusions this morning) CBC revealed Hbg of 10.1, up from 7.4 prior to transfusions this morning. Plan: - Refer to GI for episodes of hematochezia. - CBC tomorrow morning trend hemoglobin. - Clear liquid diet. Will ultimately defer to GI for diet recommendations. - Holding aspirin and plavix in setting of recent bleed. - Holding Amlodipine and Loasartan.   #Hemiplegia s/p CVA #PAD #Hyperlipidemia - Atorvastatin - Holding Aspirin 81 mg. - Holding Plavix.  #HTN Patient is currently normotensive. Hesitant to start antihypertensives after hypotensive episodes. Plan: - Holding Amlodipine 5 mg QD. - Holding Losartan.   # T2DM  - On metformin as outpatient  - Continue SSI  # Hypomagnesemia  Patient magnesia is 1.3. - Supplement magnesium.   Diet: Clear liquids. VTEprophylaxis:None (no anticoagulation in setting of bleed and no SCDs with severe PAD). Code Status: Full code.       LOS: 5 days  Nanetta Batty, Medical Student 05/25/2019, 6:53 AM

## 2019-05-25 NOTE — Progress Notes (Signed)
PT Cancellation Note  Patient Details Name: RHON BLOOME MRN: [id number] DOB: [birth year]   Cancelled Treatment:    Reason Eval/Treat Not Completed: Medical issues which prohibited therapy; patient with continued hemoglobin drop overnight.  RN requested to hold PT for today.  Will attempt again another day.    Reginia Naas 05/25/2019, 12:10 PM Magda Kiel, [place] [phone] 05/25/2019

## 2019-05-25 NOTE — Progress Notes (Signed)
Called to NM because pt monitor reading vtach. Pt was not in vtach, pt in NSR. Pt alert and oriented. Pt bp stable. Will continue to monitor pt via telemonitor at nurses station.

## 2019-05-25 NOTE — Progress Notes (Addendum)
          Daily Rounding Note  05/25/2019, 9:33 AM  LOS: 5 days   SUBJECTIVE:   Chief complaint: Hematochezia, stercoral ulcer.      Passed blood ~ 2 AM with BP drop to 89/63, 95/54.  HR to 121, 141.   Currently in [place] undergoing tagged cell scan  OBJECTIVE:         Vital signs in last 24 hours:    Temp:  [98.2 F (36.8 C)-99.1 F (37.3 C)] 98.3 F (36.8 C) (05/18 0530) Pulse Rate:  [68-109] 68 (05/18 0836) Resp:  [12-20] 19 (05/18 0836) BP: (89-115)/(54-67) 101/62 (05/18 0836) SpO2:  [99 %-100 %] 100 % (05/18 0345) Last BM Date: 05/25/19 Filed Weights   05/20/19 0857 05/21/19 0500 05/21/19 1230  Weight: 59.9 kg 59.5 kg 59.5 kg   In nuc med so not re-examined.    Lab Results: Recent Labs    05/24/19 1636 05/25/19 0149 05/25/19 0625  WBC 10.0 9.4 15.7*  HGB 8.5* 7.4* 10.1*  HCT 26.4* 23.2* 30.3*  PLT 204 251 191   BMET Recent Labs    05/23/19 0304  NA 136  K 3.6  CL 104  CO2 24  GLUCOSE 155*  BUN 9  CREATININE 0.78  CALCIUM 8.6*    Studies/Results: No results found.  ASSESMENT:   *   Hematochezia with massive hemorrhaging.  Recurrent early this AM.   05/20/2019 CTAP angiography.  No active bleeding.  Wall thickening, prominent mucosa in rectum.  Arthrosis sclerosis involving distal abdominal aorta with small amount ulcerative plaque.  No AAA.  Main visceral arteries patent, occluded right hypogastric artery.  Stenosis in bilateral proximal SFAs. 05/21/2019 colonoscopy: 3 polyps encountered, 3.5 cm polyp in the cecum was biopsied (TA without HGD), 6 mm ascending polyp and 6 mm rectal polyp left in place/not biopsied.  Non-bleeding, solitary, stercoral ulcer in distal rectum, felt to be source of bleeding. Bleeding stopped then restarted PM 05/22/19.   05/23/2019 flex sig with nonbleeding, 4 mm rectal ulcer (looked a little smaller than 2 days prior).  Endo Clip placed.  6 mm sessile distal rectal polyp was not  removed. Recurrent bleeding with hypotension and tachycardia this AM. NM RBC scan in progress.    *    ABL anemia.   Hgb 10.7 >> 7.2 >> 8.8  >> 7.4  >> 2 PRBCs >> 10.1 6 PRBCs so far.    *   Chronic Plavix/81 ASA, on hold. Indication is CVA 2015, 2017 and ASPVD.   Dr Loletha Carrow rec holding for 14 days (target restart 5/30) to allow for ulcer to heal.       PLAN   *   Await findings of RBC scan.  If positive bleeding, go for angiography/embolization.  No renal insufficiency to hinder angiography.     Billy Torres  05/25/2019, 9:33 AM Phone [phone]   ________________________________________________________________________  Velora Heckler GI MD note:  I personally examined the patient, reviewed the data and agree with the assessment and plan described above.  The nuc med scan was negative today. His bleeding last night, this morning seems pretty dramatic (BP drop, HR elevated).  We are planning another flex sig tomorrow, If still only site of possible bleed is the rectal ulcer I will place more clips there.   Owens Loffler, MD [facility] Gastroenterology Pager [phone]

## 2019-05-25 NOTE — Progress Notes (Signed)
Pt arrived on unit. VS stable. 1 unit of blood transfused prior to arrival. Another unit given by RN upon arrival to the unit per [facility]. Pt oriented to room, bed alarm on, call bell in reach. Will continue to monitor.

## 2019-05-25 NOTE — Significant Event (Addendum)
Rapid Response Event Note  Overview: Acute lower GIB with hypovolemic shock  Initial Focused Assessment: Nursing staff notified me of patient after several bloody stools with clots becoming tachycardic, pale and diaphoretic. Primary svc notified and came to bedside. HR 121 ST, 89/63 (71), RR 18 with sats 100%. Skin is cool, pale and diaphoretic. Cap refill is greater than 4 secs. Mr. Billy Torres states he is cold and stated yes when asked if he had abdominal cramping. He denies CP and SOB. H/H 7.4/23.2. LR bolus initiated and first unit of PRBCS initiated. Dr. Berline Lopes and Dr. Court Joy at bedside. Pt transferred to [id number] without complication.    0345-98.2 F, HR 84 SR, 91/61 (73), RR 16 with sats 100% on 2LNC.  Second unit PRBCs initiated. LR bolus complete.   Interventions: -LR 1 liter bolus -2 units PRBCs -tx PCU [id number])   Event Summary: Call received 0227 Arrived at call 0230 Call ended 0400  [name]

## 2019-05-25 NOTE — Progress Notes (Signed)
S: Paged to evaluate patient bedside for hematochezia. Patient was admitted for hematochezia and has underwent colonoscopy and flex sigmoidoscopy. Source of bleeding is thought to be stercoral ulcer.   Patient is alert and oriented x4 denies any abdominal pain, lightheadedness, or dizziness.   O: Vitals stable  abdomen is not distended , soft, active bowel sounds, not tender Patient has large amount of gross blood on chuck pad, no stool.   A/P: Given patient's ongoing hematochezia, ordered 2 units packed red blood cells.   - Stat CBC - Type and Cross - 2 units PRBC  Tamsen Snider, MD PGY1

## 2019-05-25 NOTE — Progress Notes (Signed)
Attempted to see pt this am. Pt at procedure.  Pt with bloody stool again last night with drop in BP.  Nursing asking to hold on this pt for today and try again tomorrow. Jinger Neighbors, [place] [id number]

## 2019-05-25 NOTE — Progress Notes (Signed)
Pt had  hematochezia X4 , paged IMTS on call to report, VS as ffs: BP 102/64, HR 89, RR 16, O2 sat 99% on RA, stat CBC ordered

## 2019-05-25 NOTE — H&P (View-Only) (Signed)
          Daily Rounding Note  05/25/2019, 9:33 AM  LOS: 5 days   SUBJECTIVE:   Chief complaint: Hematochezia, stercoral ulcer.      Passed blood ~ 2 AM with BP drop to 89/63, 95/54.  HR to 121, 141.   Currently in [place] undergoing tagged cell scan  OBJECTIVE:         Vital signs in last 24 hours:    Temp:  [98.2 F (36.8 C)-99.1 F (37.3 C)] 98.3 F (36.8 C) (05/18 0530) Pulse Rate:  [68-109] 68 (05/18 0836) Resp:  [12-20] 19 (05/18 0836) BP: (89-115)/(54-67) 101/62 (05/18 0836) SpO2:  [99 %-100 %] 100 % (05/18 0345) Last BM Date: 05/25/19 Filed Weights   05/20/19 0857 05/21/19 0500 05/21/19 1230  Weight: 59.9 kg 59.5 kg 59.5 kg   In nuc med so not re-examined.    Lab Results: Recent Labs    05/24/19 1636 05/25/19 0149 05/25/19 0625  WBC 10.0 9.4 15.7*  HGB 8.5* 7.4* 10.1*  HCT 26.4* 23.2* 30.3*  PLT 204 251 191   BMET Recent Labs    05/23/19 0304  NA 136  K 3.6  CL 104  CO2 24  GLUCOSE 155*  BUN 9  CREATININE 0.78  CALCIUM 8.6*    Studies/Results: No results found.  ASSESMENT:   *   Hematochezia with massive hemorrhaging.  Recurrent early this AM.   05/20/2019 CTAP angiography.  No active bleeding.  Wall thickening, prominent mucosa in rectum.  Arthrosis sclerosis involving distal abdominal aorta with small amount ulcerative plaque.  No AAA.  Main visceral arteries patent, occluded right hypogastric artery.  Stenosis in bilateral proximal SFAs. 05/21/2019 colonoscopy: 3 polyps encountered, 3.5 cm polyp in the cecum was biopsied (TA without HGD), 6 mm ascending polyp and 6 mm rectal polyp left in place/not biopsied.  Non-bleeding, solitary, stercoral ulcer in distal rectum, felt to be source of bleeding. Bleeding stopped then restarted PM 05/22/19.   05/23/2019 flex sig with nonbleeding, 4 mm rectal ulcer (looked a little smaller than 2 days prior).  Endo Clip placed.  6 mm sessile distal rectal polyp was not  removed. Recurrent bleeding with hypotension and tachycardia this AM. NM RBC scan in progress.    *    ABL anemia.   Hgb 10.7 >> 7.2 >> 8.8  >> 7.4  >> 2 PRBCs >> 10.1 6 PRBCs so far.    *   Chronic Plavix/81 ASA, on hold. Indication is CVA 2015, 2017 and ASPVD.   Dr Loletha Carrow rec holding for 14 days (target restart 5/30) to allow for ulcer to heal.       PLAN   *   Await findings of RBC scan.  If positive bleeding, go for angiography/embolization.  No renal insufficiency to hinder angiography.     Billy Torres  05/25/2019, 9:33 AM Phone [phone]   ________________________________________________________________________  Billy Torres GI MD note:  I personally examined the patient, reviewed the data and agree with the assessment and plan described above.  The nuc med scan was negative today. His bleeding last night, this morning seems pretty dramatic (BP drop, HR elevated).  We are planning another flex sig tomorrow, If still only site of possible bleed is the rectal ulcer I will place more clips there.   Billy Loffler, MD [facility] Gastroenterology Pager [phone]

## 2019-05-25 NOTE — Progress Notes (Signed)
Pt noted to be tachycardic, cold and clammy , nauseous. Vs are BP 69/40, HR 140, RR 20, O2 sat 99% on 2 lpm, [facility] called, Dr. Court Joy called to in form with order to give 2units of PRBC, and to transfer to 4N.

## 2019-05-25 NOTE — Progress Notes (Addendum)
[place] called and was concerned patient was in V-Tach. Questioned LOC and patient is alert and oriented, in no pain. RN was tied up at that moment suggested asking IR RN or calling RR is questions. Will follow up. Simmie Davies RN  0830 Called NM back to see if further assistance needed or concerns resolved. IR RN assessed patient and stated was in NSR. Patient stable and will be in NM about 2 more hours per technician. They have RN number. Simmie Davies RN

## 2019-05-26 ENCOUNTER — Encounter (HOSPITAL_COMMUNITY)
Admission: EM | Disposition: A | Discharge status: Skilled Nursing Facility | Payer: Self-pay | Source: Skilled Nursing Facility | Attending: Internal Medicine

## 2019-05-26 ENCOUNTER — Inpatient Hospital Stay (HOSPITAL_COMMUNITY): Payer: Medicare (Managed Care) | Admitting: Certified Registered Nurse Anesthetist

## 2019-05-26 ENCOUNTER — Encounter (HOSPITAL_COMMUNITY): Payer: Self-pay | Admitting: Internal Medicine

## 2019-05-26 HISTORY — PX: HEMOSTASIS CLIP PLACEMENT: SHX6857

## 2019-05-26 HISTORY — PX: FLEXIBLE SIGMOIDOSCOPY: SHX5431

## 2019-05-26 LAB — TYPE AND SCREEN
ABO/RH(D): O POS
Antibody Screen: NEGATIVE
Unit division: 0
Unit division: 0

## 2019-05-26 LAB — GLUCOSE, CAPILLARY
Glucose-Capillary: 110 mg/dL — ABNORMAL HIGH (ref 70–99)
Glucose-Capillary: 159 mg/dL — ABNORMAL HIGH (ref 70–99)
Glucose-Capillary: 103 mg/dL — ABNORMAL HIGH (ref 70–99)
Glucose-Capillary: 190 mg/dL — ABNORMAL HIGH (ref 70–99)

## 2019-05-26 LAB — BPAM RBC
Blood Product Expiration Date: 202106182359
Blood Product Expiration Date: 202106182359
ISSUE DATE / TIME: 202105180237
ISSUE DATE / TIME: 202105180237
Unit Type and Rh: 5100
Unit Type and Rh: 5100

## 2019-05-26 LAB — CBC
HCT: 27.3 % — ABNORMAL LOW (ref 39.0–52.0)
Hemoglobin: 9 g/dL — ABNORMAL LOW (ref 13.0–17.0)
MCH: 28.8 pg (ref 26.0–34.0)
MCHC: 33 g/dL (ref 30.0–36.0)
MCV: 87.5 fL (ref 80.0–100.0)
Platelets: 273 10*3/uL (ref 150–400)
RBC: 3.12 MIL/uL — ABNORMAL LOW (ref 4.22–5.81)
RDW: 15.3 % (ref 11.5–15.5)
WBC: 10.8 10*3/uL — ABNORMAL HIGH (ref 4.0–10.5)
nRBC: 0 % (ref 0.0–0.2)

## 2019-05-26 SURGERY — SIGMOIDOSCOPY, FLEXIBLE
Anesthesia: Monitor Anesthesia Care

## 2019-05-26 MED ORDER — PROPOFOL 500 MG/50ML IV EMUL
INTRAVENOUS | Status: DC | PRN
  Administered 2019-05-26: 100 ug/kg/min via INTRAVENOUS

## 2019-05-26 MED ORDER — LIDOCAINE 2% (20 MG/ML) 5 ML SYRINGE
INTRAMUSCULAR | Status: DC | PRN
  Administered 2019-05-26: 40 mg via INTRAVENOUS

## 2019-05-26 MED ORDER — LACTATED RINGERS IV SOLN: INTRAVENOUS | Status: DC | PRN

## 2019-05-26 MED ORDER — LACTATED RINGERS IV BOLUS
1000.0000 mL | Freq: Once | INTRAVENOUS | Status: AC
  Administered 2019-05-26: 1000 mL via INTRAVENOUS

## 2019-05-26 NOTE — Anesthesia Preprocedure Evaluation (Signed)
Anesthesia Evaluation  Patient identified by MRN, date of birth, ID band Patient awake    Reviewed: Allergy & Precautions, NPO status , Patient's Chart, lab work & pertinent test results  History of Anesthesia Complications Negative for: history of anesthetic complications  Airway Mallampati: II  TM Distance: >3 FB Neck ROM: Full    Dental  (+) Dental Advisory Given   Pulmonary Current Smoker and Patient abstained from smoking.,    Pulmonary exam normal        Cardiovascular hypertension, Pt. on medications + Peripheral Vascular Disease  Normal cardiovascular exam   '17 Carotid US - 1-39% b/l ICAS  '17 TTE - mild LVH. EF 65% to 70%. No significant valvulopathy     Neuro/Psych PSYCHIATRIC DISORDERS Dementia  B/l hearing loss  TIACVA (left hemiparesis), Residual Symptoms    GI/Hepatic Neg liver ROS,  GIB    Endo/Other  diabetes, Type 2, Oral Hypoglycemic Agents  Renal/GU CRFRenal disease     Musculoskeletal  (+) Arthritis , Osteoarthritis,    Abdominal   Peds  Hematology  (+) anemia ,   Anesthesia Other Findings Covid neg 5/13   Reproductive/Obstetrics                             Anesthesia Physical  Anesthesia Plan  ASA: III  Anesthesia Plan: MAC   Post-op Pain Management:    Induction: Intravenous  PONV Risk Score and Plan: 1 and Propofol infusion and Treatment may vary due to age or medical condition  Airway Management Planned: Nasal Cannula and Natural Airway  Additional Equipment: None  Intra-op Plan:   Post-operative Plan:   Informed Consent: I have reviewed the patients History and Physical, chart, labs and discussed the procedure including the risks, benefits and alternatives for the proposed anesthesia with the patient or authorized representative who has indicated his/her understanding and acceptance.       Plan Discussed with: CRNA and  Anesthesiologist  Anesthesia Plan Comments:         Anesthesia Quick Evaluation

## 2019-05-26 NOTE — Progress Notes (Addendum)
Patient having soft pressures in the 90s/50s. Manual BP reading of 92/55 noted. Patient denies any dizziness or lightheadedness. New bag of NS hung to run at 142mL/hr per order. MD notified and bolus to be administered per order.   Update: NS fluids discontinued per order. LR bolus to be administered.   2352: Patient continues to have soft BPs one hour after the completion of his bolus. MD aware; no new orders.

## 2019-05-26 NOTE — Interval H&P Note (Signed)
History and Physical Interval Note:  05/26/2019 1:48 PM  Billy Torres  has presented today for surgery, with the diagnosis of rectal ulcer, recurrent bleeding.  The various methods of treatment have been discussed with the patient and family. After consideration of risks, benefits and other options for treatment, the patient has consented to  Procedure(s): FLEXIBLE SIGMOIDOSCOPY (N/A) as a surgical intervention.  The patient's history has been reviewed, patient examined, no change in status, stable for surgery.  I have reviewed the patient's chart and labs.  Questions were answered to the patient's satisfaction.     Milus Banister

## 2019-05-26 NOTE — Evaluation (Signed)
Physical Therapy Evaluation Patient Details Name: Billy Torres MRN: [id number] DOB: [birth year] Today's Date: 05/26/2019   History of Present Illness  64 year old male with hemiplegia s/p CVA, dementia, HTN, DM, prostate cancer, urinary retention, L BKA 4/21, and HLD presented to the emergency department after having a bloodied bowel movement. Pt underwent colonoscopy on 5/14 and  flexible sigmoidoscopy on 5/16.  Clinical Impression  Pt presents to PT with deficits in functional mobility, balance, endurance, strength, power, cognition, and communication. Pt continuing to require assistance with transfers and functional mobility since L BKA on 4/21 per pt report. Pt continues to require physical assistance to transfer due to weakness and balance impairments, greatly increasing his falls risk. Pt with flat affect and limited communication during session but does report a goal of wanting to return to mobilizing independently at a wheelchair level. Pt will benefit from continued acute PT POC to improve mobility quality and to reduce caregiver burden. PT recommends return to SNF for further PT services at the time of discharge.    Follow Up Recommendations SNF;Supervision/Assistance - 24 hour    Equipment Recommendations  (defer to post-acute setting)    Recommendations for Other Services       Precautions / Restrictions Precautions Precautions: Fall Restrictions Weight Bearing Restrictions: Yes LLE Weight Bearing: Non weight bearing      Mobility  Bed Mobility Overal bed mobility: Needs Assistance Bed Mobility: Supine to Sit;Rolling Rolling: Supervision   Supine to sit: Min assist        Transfers Overall transfer level: Needs assistance Equipment used: 1 person hand held assist Transfers: Sit to/from [employer] Transfers Sit to Stand: Mod assist Stand pivot transfers: Mod assist          Ambulation/Gait                Stairs             Wheelchair Mobility    Modified Rankin (Stroke Patients Only)       Balance Overall balance assessment: Needs assistance Sitting-balance support: No upper extremity supported;Feet supported Sitting balance-Leahy Scale: Good Sitting balance - Comments: supervision at the edge of bed   Standing balance support: Single extremity supported Standing balance-Leahy Scale: Poor Standing balance comment: min-modA for static standing balance                             Pertinent Vitals/Pain Pain Assessment: Faces Faces Pain Scale: Hurts little more Pain Location: R foot Pain Descriptors / Indicators: Grimacing Pain Intervention(s): Monitored during session    Home Living Family/patient expects to be discharged to:: Skilled nursing facility Living Arrangements: Other relatives(nephew and nephew's fiance) Available Help at Discharge: Family;Available PRN/intermittently Type of Home: House Home Access: Stairs to enter Entrance Stairs-Rails: Psychiatric nurse of Steps: 6 Home Layout: One level Home Equipment: Wheelchair - [employer] - 2 wheels Additional Comments: Pt reports he lives with his nephew, and nephew's girlfriend.  He reports nephew works during the day, but the fiancee' is at home during this time.  [facility], and has an aid 4 hours/day through [facility]    Prior Function Level of Independence: Needs assistance   Gait / Transfers Assistance Needed: pt reports at baseline prior to BKA he ambulates without use of device. Pt says he continues to require assistance with transfers since being at [facility]  ADL's / Homemaking Assistance Needed: per chart review pt performing  ADLs largely modI but does require assistance with bathing        Hand Dominance   Dominant Hand: Right    Extremity/Trunk Assessment   Upper Extremity Assessment Upper Extremity Assessment: Defer to OT evaluation(at least 4-/5 BUE)    Lower Extremity  Assessment Lower Extremity Assessment: RLE deficits/detail;LLE deficits/detail RLE Deficits / Details: RLE at least 4/5 based on functional mobility, R foot numb LLE Deficits / Details: L BKA, at least 3/5 knee extension, hip flexion and abduction based on observed movements. ROM WFL    Cervical / Trunk Assessment Cervical / Trunk Assessment: Normal  Communication   Communication: HOH;Expressive difficulties(slowed and quiet speech)  Cognition Arousal/Alertness: Awake/alert Behavior During Therapy: Flat affect Overall Cognitive Status: History of cognitive impairments - at baseline Area of Impairment: Attention;Memory;Following commands;Safety/judgement;Awareness;Problem solving                   Current Attention Level: Sustained Memory: Decreased recall of precautions;Decreased short-term memory Following Commands: Follows one step commands consistently Safety/Judgement: Decreased awareness of safety;Decreased awareness of deficits Awareness: Emergent Problem Solving: Slow processing;Requires verbal cues        General Comments General comments (skin integrity, edema, etc.): VSS on RA    Exercises     Assessment/Plan    PT Assessment Patient needs continued PT services  PT Problem List Decreased strength;Decreased activity tolerance;Decreased balance;Decreased mobility;Decreased cognition;Decreased knowledge of use of DME;Decreased safety awareness;Decreased knowledge of precautions;Impaired sensation;Pain       PT Treatment Interventions DME instruction;Functional mobility training;Therapeutic exercise;Therapeutic activities;Balance training;Neuromuscular re-education;Patient/family education;Wheelchair mobility training;Cognitive remediation    PT Goals (Current goals can be found in the Care Plan section)  Acute Rehab PT Goals Patient Stated Goal: To return to independent mobility PT Goal Formulation: With patient Time For Goal Achievement: 06/09/19 Potential  to Achieve Goals: Good Additional Goals Additional Goal #1: Pt will mobilize in manual wheelchair independently utilizing BUEs and RLE for 100'.    Frequency Min 2X/week   Barriers to discharge        Co-evaluation               AM-PAC PT "6 Clicks" Mobility  Outcome Measure Help needed turning from your back to your side while in a flat bed without using bedrails?: None Help needed moving from lying on your back to sitting on the side of a flat bed without using bedrails?: A Little Help needed moving to and from a bed to a chair (including a wheelchair)?: A Lot Help needed standing up from a chair using your arms (e.g., wheelchair or bedside chair)?: A Lot Help needed to walk in hospital room?: Total Help needed climbing 3-5 steps with a railing? : Total 6 Click Score: 13    End of Session   Activity Tolerance: Patient tolerated treatment well Patient left: in chair;with call bell/phone within reach;with chair alarm set Nurse Communication: Mobility status PT Visit Diagnosis: Other abnormalities of gait and mobility (R26.89);Muscle weakness (generalized) (M62.81) Pain - Right/Left: Right Pain - part of body: Ankle and joints of foot    Time: [id number] PT Time Calculation (min) (ACUTE ONLY): 29 min   Charges:   PT Evaluation $PT Eval Moderate Complexity: 1 Mod PT Treatments $Therapeutic Activity: 8-22 mins        Zenaida Niece, PT, DPT Acute Rehabilitation Pager: [phone]   Zenaida Niece 05/26/2019, 9:18 AM

## 2019-05-26 NOTE — Progress Notes (Signed)
Patient off the floor for sigmoidoscopy. Consent singed and on the chart. IV to the right hand is clean, dry and patent.

## 2019-05-26 NOTE — Transfer of Care (Signed)
Immediate Anesthesia Transfer of Care Note  Patient: Billy Torres  Procedure(s) Performed: FLEXIBLE SIGMOIDOSCOPY (N/A ) HEMOSTASIS CLIP PLACEMENT  Patient Location: Endoscopy Unit  Anesthesia Type:MAC  Level of Consciousness: awake, alert  and oriented  Airway & Oxygen Therapy: Patient Spontanous Breathing and Patient connected to nasal cannula oxygen  Post-op Assessment: Report given to RN and Post -op Vital signs reviewed and stable  Post vital signs: Reviewed and stable  Last Vitals:  Vitals Value Taken Time  BP 90/62 05/26/19 1423  Temp    Pulse 59 05/26/19 1425  Resp 19 05/26/19 1425  SpO2 100 % 05/26/19 1425  Vitals shown include unvalidated device data.  Last Pain:  Vitals:   05/26/19 1146  TempSrc: Oral  PainSc: 4       Patients Stated Pain Goal: 0 (49/44/96 7591)  Complications: No apparent anesthesia complications

## 2019-05-26 NOTE — Op Note (Addendum)
[facility] Patient Name: Billy Torres Procedure Date : 05/26/2019 MRN: [id number] Attending MD: Milus Banister , MD Date of Birth: 22-Dec-1955 CSN: [id number] Age: 64 Admit Type: Inpatient Procedure:                Flexible Sigmoidoscopy Indications:              Recurrent overt rectal bleeding likely from distal                            rectal ulcer noted 5/14 colonoscopy, clipped 5/16                            flex sig, recurrent signficant bleeding with drop                            in BP and increase HR about 24 hours ago. Providers:                Milus Banister, MD, Carlyn Reichert, RN, Elspeth Cho Tech., Technician, Carver Fila Referring MD:              Medicines:                Monitored Anesthesia Care Complications:            No immediate complications. Estimated blood loss:                            None. Estimated Blood Loss:     Estimated blood loss: none. Procedure:                Pre-Anesthesia Assessment:                           - Prior to the procedure, a History and Physical                            was performed, and patient medications and                            allergies were reviewed. The patient's tolerance of                            previous anesthesia was also reviewed. The risks                            and benefits of the procedure and the sedation                            options and risks were discussed with the patient.                            All questions were answered, and informed consent  was obtained. Prior Anticoagulants: The patient has                            taken no previous anticoagulant or antiplatelet                            agents. ASA Grade Assessment: III - A patient with                            severe systemic disease. After reviewing the risks                            and benefits, the patient was deemed in          satisfactory condition to undergo the procedure.                           After obtaining informed consent, the scope was                            passed under direct vision. The [id number]                            Olympus colonoscope was introduced through the anus                            and advanced to the the splenic flexure. The                            flexible sigmoidoscopy was accomplished without                            difficulty. The patient tolerated the procedure                            well. The quality of the bowel preparation was good. Scope In: Scope Out: Findings:      Good prep allowed evaluation to the splenic flexure. No blood in the       colon.      The site of 5/14 distal rectal ulcer and then 5/16 clip placement at the       ulcer site was located and evaluated. The clip was in good position, the       ulcer was buried under the clip and tented up mucosa. At one site of the       clip the mucosa was a bit cherry red and protuberent. Not classic for a       visible vessel at all. Given his significant, overt rebleeding 1-2 days       after the clip was placed on 5/16 I decided to place another 72mm clip       at the site. This precipitated impressive, brisk bleeding which was a       pulsatile but not spurting. I placed another, larger (53mm) clip at the       site and achieved immediate hemostasis. Impression:               -  The site of 5/14 distal rectal ulcer and then                            5/16 clip placement at the ulcer site was located                            and evaluated. The clip was in good position, the                            ulcer was buried under the clip and tented up                            mucosa. At one site of the clip the mucosa was a                            bit cherry red and protuberent. Not classic for a                            visible vessel at all. Given his significant, overt                             rebleeding 1-2 days after the clip was placed on                            5/16 I decided to place another 49mm clip at the                            site. This precipitated impressive, brisk bleeding                            which was a pulsatile but not spurting. I placed                            another, larger (43mm) clip at the site and                            achieved immediate hemostasis.                           - Given the impressive, brisk bleeding at the site                            I am quite convinced that this has indeed been the                            source of his overt rectal bleeding. It is presumed                            to be from a stercoral ulcer. There are now three  clips at the site. The third clips resulted in                            immediate, complete hemostasis while he was                            actively briskly bleeding and so I doubt he will                            bleed from there again. Need to keep his bowel soft                            for then next several weeks.                           - Please call or page if he has overt rebleeding                            again. Recommendation:           - Return to floor for continued care.                           - Safe to d/c home from [facility] tomorrow if                            he has no rebleeding.                           - OK to resume his plavix in 3 days. Procedure Code(s):        --- Professional ---                           [phone], Sigmoidoscopy, flexible; with control of                            bleeding, any method Diagnosis Code(s):        --- Professional ---                           K62.6, Ulcer of anus and rectum                           K92.1, Melena (includes Hematochezia) CPT copyright 2019 American Medical Association. All rights reserved. The codes documented in this report are preliminary and upon  coder review may  be revised to meet current compliance requirements. Milus Banister, MD 05/26/2019 2:36:27 PM This report has been signed electronically. Number of Addenda: 0

## 2019-05-26 NOTE — Progress Notes (Signed)
OT Cancellation Note  Patient Details Name: Billy Torres MRN: [id number] DOB: [birth year]   Cancelled Treatment:    Reason Eval/Treat Not Completed: Patient at procedure or test/ unavailable (off floor for sigmoidoscopy)  Jeri Modena 05/26/2019, 1:06 PM   Fleeta Emmer, OTR/L  Acute Rehabilitation Services Pager: [phone] Office: [phone] .

## 2019-05-26 NOTE — Anesthesia Procedure Notes (Signed)
Procedure Name: MAC Date/Time: 05/26/2019 2:00 PM Performed by: Alain Marion, CRNA Pre-anesthesia Checklist: Patient identified, Emergency Drugs available, Suction available and Patient being monitored Patient Re-evaluated:Patient Re-evaluated prior to induction Oxygen Delivery Method: Nasal cannula Placement Confirmation: positive ETCO2

## 2019-05-26 NOTE — Progress Notes (Signed)
Brief History:  Billy Torres a 64 year old malewith hemiplegia s/p CVA, dementia, HTN, DM, prostate cancer, urinary retention, and HLDpresented to the emergency department after having a bloodied bowel movementand subsequently admitted for lower GI bleed with symptomatic anemia.  Subjective:  Billy Torres was evaluated and examined at bedside this morning. He states that he is doing well. Patient denies any fever, chills, dizziness, lightheadedness, confusion, changes in vision, or chest pain. We discussed that the patient would be undergoing another flexible sigmoidoscopy (per GI note) today. Patient did not have any episodes of hematochezia overnight.  Objective:  Vital signs in last 24 hours: Vitals:   05/25/19 2304 05/25/19 2323 05/26/19 0736 05/26/19 1146  BP: 114/76  115/68 108/69  Pulse: 82  91 85  Resp: 19  19 17   Temp:  98.8 F (37.1 C) 98.5 F (36.9 C) 98.3 F (36.8 C)  TempSrc:  Oral Oral Oral  SpO2: 100%  100% 100%  Weight:      Height:       Weight change:   Intake/Output Summary (Last 24 hours) at 05/26/2019 1349 Last data filed at 05/26/2019 [id number] Gross per 24 hour  Intake 1050 ml  Output 1400 ml  Net -350 ml   Physical Exam  Constitutional: He is oriented to person, place, and time. No distress.  HENT:  Head: Normocephalic and atraumatic.  Eyes: EOM are normal.  Cardiovascular: Normal rate, regular rhythm and normal heart sounds. Exam reveals no gallop and no friction rub.  No murmur heard. Pulmonary/Chest: Effort normal. No respiratory distress. He has no wheezes.  Abdominal: Soft. Bowel sounds are normal. He exhibits no distension. There is no abdominal tenderness.  Musculoskeletal:        General: No edema. Normal range of motion.  Neurological: He is alert and oriented to person, place, and time.  Skin: Skin is warm and dry. No rash noted. He is not diaphoretic.  Psychiatric: Mood and affect normal.      Assessment/Plan:  Principal  Problem:   Hematochezia Active Problems:   PVD (peripheral vascular disease) (HCC)   Pressure injury of skin   Acute blood loss anemia   Aortic atherosclerosis (HCC)  #Acute lower GI bleed Patient remained stable overnight. Patient has previously had a colonoscopy (5/14) and flexible sigmoidoscopy (5/16). Stercoral ulcer was determined to be the likely source of bleeding and was clipped. Patient has had another episode of hematochezia since then with hypotension and tachycardia required fluid resuscitation and transfusion of blood products.Patient had a negative NM GI blood loss scan for active GI bleed. Per GI note, patient is scheduled to undergo another flexible sigmoidoscopy today. Assessment:Patient has had multiple episodes of hematochezia with tachycardia and hypotension which responded well to fluid resuscitation and blood transfusions. Patient's vital signs are currently stable and he does not report any concerning symptoms for hypovolemic shock. Patients most recent (obtained after transfusions this morning) CBC revealed Hbg of 9.0, down from 10.1 the previous day. Plan: - Refer to GI for episodes of hematochezia. Undergoing flexible sigmoidoscopy today. - NPO for GI procedure. Refer to GI post procedure. - Holding aspirin and plavix in setting of recent bleed. - Holding Amlodipine and Loasartan. - CBC tomorrow morning to trend hemoglobin. Not overly concerned, but given recent bleeds requiring fluid resuscitation and blood transfusion, would like to monitor Hbg. - Monitor vitals.  #Hemiplegia s/p CVA #PAD #Hyperlipidemia - Atorvastatin. - Holding Aspirin 81 mg. - Holding Plavix. Will restart 14 days post-active bleeding.  #HTN Patient  remains normotensive. Hesitant to start antihypertensives after hypotensive episodes. Plan: - Holding Amlodipine 5 mg QD. - Holding Losartan.   # T2DM  - On metformin as outpatient. -ContinueSSI.  # Hypomagnesemia - Check  magnesium tomorrow morning.   Diet:NPO for GI procedure. VTEprophylaxis:None (no anticoagulation in setting of bleed and no SCDs with severe PAD). Code Status: Full code.    LOS: 6 days   Billy Torres, Medical Student 05/26/2019, 1:49 PM

## 2019-05-27 LAB — GLUCOSE, CAPILLARY
Glucose-Capillary: 120 mg/dL — ABNORMAL HIGH (ref 70–99)
Glucose-Capillary: 218 mg/dL — ABNORMAL HIGH (ref 70–99)
Glucose-Capillary: 153 mg/dL — ABNORMAL HIGH (ref 70–99)
Glucose-Capillary: 134 mg/dL — ABNORMAL HIGH (ref 70–99)
Glucose-Capillary: 137 mg/dL — ABNORMAL HIGH (ref 70–99)

## 2019-05-27 LAB — CBC
HCT: 25.5 % — ABNORMAL LOW (ref 39.0–52.0)
Hemoglobin: 8.3 g/dL — ABNORMAL LOW (ref 13.0–17.0)
MCH: 28.3 pg (ref 26.0–34.0)
MCHC: 32.5 g/dL (ref 30.0–36.0)
MCV: 87 fL (ref 80.0–100.0)
Platelets: 279 10*3/uL (ref 150–400)
RBC: 2.93 MIL/uL — ABNORMAL LOW (ref 4.22–5.81)
RDW: 15.5 % (ref 11.5–15.5)
WBC: 10 10*3/uL (ref 4.0–10.5)
nRBC: 0 % (ref 0.0–0.2)

## 2019-05-27 NOTE — Progress Notes (Deleted)
   05/23/19 0909  Assess: MEWS Score  Temp 99.8 F (37.7 C)  BP (!) 98/56  Pulse Rate (!) 116  SpO2 97 %  Assess: MEWS Score  MEWS Temp 0  MEWS Systolic 1  MEWS Pulse 2  MEWS RR 1  MEWS LOC 0  MEWS Score 4  MEWS Score Color Red  Assess: if the MEWS score is Yellow or Red  Were vital signs taken at a resting state? Yes  Focused Assessment Documented focused assessment  Early Detection of Sepsis Score *See Row Information* Low  MEWS guidelines implemented *See Row Information* Yes (and vital signs rechecked and monitoring frequently)  Treat  MEWS Interventions Escalated (See documentation below);Administered scheduled meds/treatments  Take Vital Signs  Increase Vital Sign Frequency  Red: Q 1hr X 4 then Q 4hr X 4, if remains red, continue Q 4hrs  Escalate  MEWS: Escalate Red: discuss with charge nurse/RN and provider, consider discussing with RRT  Notify: Charge Nurse/RN  Name of Charge Nurse/RN Notified Zee, RN  Date Charge Nurse/RN Notified 05/23/19  Time Charge Nurse/RN Notified [id number]  Notify: Provider  Provider Name/Title Dr. Sherry Ruffing  Date Provider Notified 05/23/19  Time Provider Notified [phone]  Notification Type Call  Notification Reason Change in status;Other (Comment) (updated on condition)  Response No new orders  Date of Provider Response 05/23/19  Time of Provider Response [phone]  Notify: Rapid Response  Name of Rapid Response RN Notified Nicholes Rough, Rn  Date Rapid Response Notified 05/23/19  Time Rapid Response Notified 0800  Document  Patient Outcome Transferred/level of care increased (change to tele medical)  Progress note created (see row info) Yes     05/23/19 0909  Assess: MEWS Score  Temp 99.8 F (37.7 C)  BP (!) 98/56  Pulse Rate (!) 116  SpO2 97 %  Assess: MEWS Score  MEWS Temp 0  MEWS Systolic 1  MEWS Pulse 2  MEWS RR 1  MEWS LOC 0  MEWS Score 4  MEWS Score Color Red  Assess: if the MEWS score is Yellow or Red  Were vital signs taken  at a resting state? Yes  Focused Assessment Documented focused assessment  Early Detection of Sepsis Score *See Row Information* Low  MEWS guidelines implemented *See Row Information* Yes (and vital signs rechecked and monitoring frequently)  Treat  MEWS Interventions Escalated (See documentation below);Administered scheduled meds/treatments  Take Vital Signs  Increase Vital Sign Frequency  Red: Q 1hr X 4 then Q 4hr X 4, if remains red, continue Q 4hrs  Escalate  MEWS: Escalate Red: discuss with charge nurse/RN and provider, consider discussing with RRT  Notify: Charge Nurse/RN  Name of Charge Nurse/RN Notified Zee, RN  Date Charge Nurse/RN Notified 05/23/19  Time Charge Nurse/RN Notified [id number]  Notify: Provider  Provider Name/Title Dr. Sherry Ruffing  Date Provider Notified 05/23/19  Time Provider Notified [phone]  Notification Type Call  Notification Reason Change in status;Other (Comment) (updated on condition)  Response No new orders  Date of Provider Response 05/23/19  Time of Provider Response [phone]  Notify: Rapid Response  Name of Rapid Response RN Notified Nicholes Rough, Rn  Date Rapid Response Notified 05/23/19  Time Rapid Response Notified 0800  Document  Patient Outcome Transferred/level of care increased (change to tele medical)  Progress note created (see row info) Yes

## 2019-05-27 NOTE — Progress Notes (Signed)
Occupational Therapy Evaluation Patient Details Name: Billy Torres MRN: [id number] DOB: 1955/07/16 Today's Date: 05/27/2019    History of Present Illness 64 year old male with hemiplegia s/p CVA, dementia, HTN, DM, prostate cancer, urinary retention, L BKA 4/21, and HLD presented to the emergency department after having a bloodied bowel movement. Pt underwent colonoscopy on 5/14 and  flexible sigmoidoscopy on 5/16. PMH: hemiplegia s/p CVA, dementia, HTN, DM, prostate cancer, urinary retention, L BKA 4/21, and HLD   Clinical Impression   PTA, pt undergoing rehab at [facility] since [place]. Prior to his recent surgery, pt living at home with his nephew and was independent with mobility and ADL and enjoyed fishing. Pt mobilized OOB to chair with stand pivot Transfer with Min A. Overall Mod -Max A with ADL tasks. BP 121/65 supine. 116/76 after mobilizing to recliner. Pt complaining of increased pain L residual limb. Began education on importance of desensitization to help control pain and reduce symptoms of phantom limb pain. Recommend return to SNF for continued rehab. Will follow acutely.    Follow Up Recommendations  SNF;Supervision/Assistance - 24 hour    Equipment Recommendations  None recommended by OT    Recommendations for Other Services       Precautions / Restrictions Precautions Precautions: Fall Precaution Comments: recent L BKA 4/21 Restrictions Weight Bearing Restrictions: Yes LLE Weight Bearing: Non weight bearing      Mobility Bed Mobility Overal bed mobility: Needs Assistance Bed Mobility: Supine to Sit     Supine to sit: Min guard        Transfers Overall transfer level: Needs assistance Equipment used: Rolling walker (2 wheeled) Transfers: Sit to/from [facility] Sit to Stand: Min assist Stand pivot transfers: Min assist            Balance     Sitting balance-Leahy Scale: Good       Standing balance-Leahy Scale: Poor                              ADL either performed or assessed with clinical judgement   ADL Overall ADL's : Needs assistance/impaired Eating/Feeding: Modified independent   Grooming: Set up;Sitting   Upper Body Bathing: Set up;Supervision/ safety;Sitting   Lower Body Bathing: Moderate assistance;Sitting/lateral leans;Sit to/from stand   Upper Body Dressing : Minimal assistance;Sitting   Lower Body Dressing: Maximal assistance;Sit to/from stand   Toilet Transfer: Stand-pivot;RW;Minimal Print production planner Details (indicate cue type and reason): simulated Toileting- Clothing Manipulation and Hygiene: Maximal assistance       Functional mobility during ADLs: Minimal assistance;Cueing for safety;Rolling walker;Cueing for sequencing       Vision         Perception     Praxis      Pertinent Vitals/Pain Pain Assessment: 0-10 Pain Score: 7  Pain Location: L residual limb Pain Descriptors / Indicators: Aching;Discomfort;Grimacing Pain Intervention(s): Limited activity within patient's tolerance;Repositioned;Patient requesting pain meds-RN notified(encouraged desensitization)     Hand Dominance Right   Extremity/Trunk Assessment Upper Extremity Assessment Upper Extremity Assessment: Generalized weakness   Lower Extremity Assessment Lower Extremity Assessment: Defer to PT evaluation       Communication Communication Communication: HOH   Cognition Arousal/Alertness: Awake/alert Behavior During Therapy: Flat affect Overall Cognitive Status: History of cognitive impairments - at baseline  General Comments       Exercises     Shoulder Instructions      Home Living Family/patient expects to be discharged to:: Skilled nursing facility                                        Prior Functioning/Environment Level of Independence: Needs assistance  Gait / Transfers Assistance  Needed: reports independent with ADL and mobility prior to BKA in April; retired Engineer, 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 ADL's / [employer] Assistance Needed: A for ADL adn mobilit at [employer] since [place] / Swallowing Assistance Needed: [place]          OT Problem List: Decreased strength;Decreased activity tolerance;Impaired balance (sitting and/or standing);Decreased cognition;Decreased safety awareness;Decreased knowledge of use of DME or AE;Pain      OT Treatment/Interventions: Self-care/ADL training;Therapeutic exercise;DME and/or AE instruction;Therapeutic activities;Cognitive remediation/compensation;Balance training;Patient/family education    OT Goals(Current goals can be found in the care plan section) Acute Rehab OT Goals Patient Stated Goal: to get back to fishing OT Goal Formulation: With patient Time For Goal Achievement: 06/10/19 Potential to Achieve Goals: Good  OT Frequency: Min 2X/week   Barriers to D/C:            Co-evaluation              AM-PAC OT "6 Clicks" Daily Activity     Outcome Measure Help from another person eating meals?: None Help from another person taking care of personal grooming?: A Little Help from another person toileting, which includes using toliet, bedpan, or urinal?: A Lot Help from another person bathing (including washing, rinsing, drying)?: A Lot Help from another person to put on and taking off regular upper body clothing?: A Little Help from another person to put on and taking off regular lower body clothing?: A Lot 6 Click Score: 16   End of Session Equipment Utilized During Treatment: Gait belt;Rolling walker Nurse Communication: Mobility status  Activity Tolerance: Patient tolerated treatment well Patient left: in chair;with call bell/phone within reach;with chair alarm set  OT Visit Diagnosis: Unsteadiness on feet (R26.81);Other abnormalities of gait and mobility (R26.89);Muscle weakness (generalized)  (M62.81);Other symptoms and signs involving cognitive function;Pain Pain - Right/Left: Left Pain - part of body: Leg                Time: [id number] OT Time Calculation (min): 32 min Charges:  OT General Charges $OT Visit: 1 Visit OT Evaluation $OT Eval Moderate Complexity: 1 Mod OT Treatments $Self Care/Home Management : 8-22 mins  Maurie Boettcher, OT/L   Acute OT Clinical Specialist [place] Pager [phone] Office [phone]   [facility] 05/27/2019, 6:20 PM

## 2019-05-27 NOTE — Progress Notes (Signed)
   05/23/19 0909  Assess: MEWS Score  Temp 99.8 F (37.7 C)  BP (!) 98/56  Pulse Rate (!) 116  SpO2 97 %  Assess: MEWS Score  MEWS Temp 0  MEWS Systolic 1  MEWS Pulse 2  MEWS RR 1  MEWS LOC 0  MEWS Score 4  MEWS Score Color Red  Assess: if the MEWS score is Yellow or Red  Were vital signs taken at a resting state? Yes  Focused Assessment Documented focused assessment  Early Detection of Sepsis Score *See Row Information* Low  MEWS guidelines implemented *See Row Information* Yes (and vital signs rechecked and monitoring frequently)  Treat  MEWS Interventions Escalated (See documentation below);Administered scheduled meds/treatments  Take Vital Signs  Increase Vital Sign Frequency  Red: Q 1hr X 4 then Q 4hr X 4, if remains red, continue Q 4hrs  Escalate  MEWS: Escalate Red: discuss with charge nurse/RN and provider, consider discussing with RRT  Notify: Charge Nurse/RN  Name of Charge Nurse/RN Notified Zee, RN  Date Charge Nurse/RN Notified 05/23/19  Time Charge Nurse/RN Notified [id number]  Notify: Provider  Provider Name/Title Dr. Sherry Ruffing  Date Provider Notified 05/23/19  Time Provider Notified [phone]  Notification Type Call  Notification Reason Change in status;Other (Comment) (updated on condition)  Response No new orders  Date of Provider Response 05/23/19  Time of Provider Response [phone]  Notify: Rapid Response  Name of Rapid Response RN Notified Nicholes Rough, Rn  Date Rapid Response Notified 05/23/19  Time Rapid Response Notified 0800  Document  Patient Outcome Transferred/level of care increased (change to tele medical)  Progress note created (see row info) Yes

## 2019-05-27 NOTE — Anesthesia Postprocedure Evaluation (Signed)
Anesthesia Post Note  Patient: Billy Torres  Procedure(s) Performed: FLEXIBLE SIGMOIDOSCOPY (N/A ) HEMOSTASIS CLIP PLACEMENT     Patient location during evaluation: Endoscopy Anesthesia Type: MAC Level of consciousness: awake and alert Pain management: pain level controlled Vital Signs Assessment: post-procedure vital signs reviewed and stable Respiratory status: spontaneous breathing, nonlabored ventilation and respiratory function stable Cardiovascular status: blood pressure returned to baseline and stable Postop Assessment: no apparent nausea or vomiting Anesthetic complications: no    Last Vitals:  Vitals:   05/27/19 0423 05/27/19 0427  BP:  (!) 152/74  Pulse:  93  Resp:  19  Temp: 37.1 C   SpO2:  100%    Last Pain:  Vitals:   05/27/19 0423  TempSrc: Oral  PainSc:    Pain Goal: Patients Stated Pain Goal: 0 (05/26/19 1950)                 Lynda Rainwater

## 2019-05-27 NOTE — Progress Notes (Signed)
Brief History:  Collins Oxendineis a 64 year old malewith hemiplegia s/p CVA, dementia, HTN, DM, prostate cancer, urinary retention, and HLDpresented to the emergency department after having a bloodied bowel movementand subsequently admitted for lower GI bleed with symptomatic anemia.  Subjective:  Mr. Deshazier was evaluated and examined at bedside this morning. Patient reports that he is feeling "okay" this morning and appears to be in no apparent distress. He denies any fever, chills, changes in vision, lightheadedness, chest pain, or dizziness. He reports that he did not have any episodes of hematochezia last night.  Objective:  Vital signs in last 24 hours: Vitals:   05/26/19 2345 05/27/19 0131 05/27/19 0423 05/27/19 0427  BP: (!) 94/58 124/80  (!) 152/74  Pulse:  95  93  Resp:  17  19  Temp:   98.8 F (37.1 C)   TempSrc:   Oral   SpO2:  100%  100%  Weight:      Height:       Weight change:   Intake/Output Summary (Last 24 hours) at 05/27/2019 0701 Last data filed at 05/27/2019 0427 Gross per 24 hour  Intake 600 ml  Output 1901 ml  Net -1301 ml   Physical Exam  Constitutional: He is oriented to person, place, and time. No distress.  Chronically ill appearing.  HENT:  Head: Normocephalic and atraumatic.  Eyes: EOM are normal.  Cardiovascular: Normal rate and regular rhythm. Exam reveals no gallop and no friction rub.  No murmur heard. Pulmonary/Chest: No respiratory distress. He has no wheezes.  Abdominal: Soft. Bowel sounds are normal. He exhibits no distension. There is no abdominal tenderness.  Musculoskeletal:     Comments: Left below the knee amputation.  Neurological: He is alert and oriented to person, place, and time.  Skin: Skin is warm and dry. He is not diaphoretic.  Some erythema noted on the distal portion of right hallux. Well-defined border.      Assessment/Plan:  Principal Problem:   Hematochezia Active Problems:   PVD (peripheral  vascular disease) (HCC)   Pressure injury of skin   Acute blood loss anemia   Aortic atherosclerosis (HCC)  Shaki Oxendineis a 64 year old malewith hemiplegia s/p CVA, dementia, HTN, DM, prostate cancer, urinary retention, and HLDpresented to the emergency department after having a bloodied bowel movementand subsequently admitted for lower GI bleed with symptomatic anemia.  #Acute lower GI bleed Patient received 1 bolus of LR last night for BP of 92/55. BP has improved and vitals have remained stable this morning. Patient's most recently recorded BP was 130/81. Patient underwent flexible sigmoidoscopy yesterday. The previously clipped stercoral ulcer was clipped twice more yesterday. Per GI note, their impression is that the now clipped ulcer was the likely cause of the episodes of hematochezia. Patient's CBC from this morning revealed hemoglobin of 8.3, down from 9.0 yesterday. Assessment:Patient vital signs have remained stable today, without overnight episodes of hematochezia. Patient is not presenting with concerning signs of hypovolemic shock. Hemaglobin 8.3, down from 9.0 yesterday. This is not overtly concerning given patient's vitals are stable and he has not had hematochezia. Will continue to monitor patient to ensure patient does not have another episode of hematochezia and that he remains stable. Plan: - Monitor vitals. - Continuing to hold Amlodipine and Losartan. - Continue to hold aspirin and plavix. - CBC tomorrow morning to trend hemoglobin. - Regular heart healthy diet.  #Hemiplegia s/p CVA #PAD #Hyperlipidemia - Atorvastatin. - Holding Aspirin 81 mg. - Holding Plavix.  #HTN Hesitant to  start antihypertensives in the setting of recurrent hypotensive episodes. Plan: - Holding Amlodipine 5 mg QD. - Holding Losartan.   # T2DM  - On metformin as outpatient. -ContinueSSI.  # Hypomagnesemia - Check magnesium tomorrow morning.   Diet:Regular:  heart healthy VTEprophylaxis:None (no anticoagulation in setting of bleed and no SCDs with severe PAD). Code Status: Full code.     LOS: 7 days   Nanetta Batty, Medical Student 05/27/2019, 7:01 AM

## 2019-05-28 LAB — CBC
HCT: 25.8 % — ABNORMAL LOW (ref 39.0–52.0)
Hemoglobin: 8.5 g/dL — ABNORMAL LOW (ref 13.0–17.0)
MCH: 28.6 pg (ref 26.0–34.0)
MCHC: 32.9 g/dL (ref 30.0–36.0)
MCV: 86.9 fL (ref 80.0–100.0)
Platelets: 328 10*3/uL (ref 150–400)
RBC: 2.97 MIL/uL — ABNORMAL LOW (ref 4.22–5.81)
RDW: 15.4 % (ref 11.5–15.5)
WBC: 9.4 10*3/uL (ref 4.0–10.5)
nRBC: 0 % (ref 0.0–0.2)

## 2019-05-28 LAB — GLUCOSE, CAPILLARY: Glucose-Capillary: 210 mg/dL — ABNORMAL HIGH (ref 70–99)

## 2019-05-28 LAB — SARS CORONAVIRUS 2 (TAT 6-24 HRS): SARS Coronavirus 2: NEGATIVE

## 2019-05-28 NOTE — Discharge Summary (Signed)
Name: Billy Torres MRN: [id number] DOB: [birth year] 64 y.o. PCP: Angelica Pou, MD  Date of Admission: 05/20/2019  8:29 AM Date of Discharge:  05/28/19 Attending Physician: Lucious Groves, DO  Discharge Diagnosis:  1. Acute GI Bleed 2. AKI 3. Hemiplegia s/p CVA/PAD 4. HTN 5. Type 2 Diabetes Mellitus  6. Hypomagnesemia   Discharge Medications: Allergies as of 05/28/2019   No Known Allergies     Medication List    STOP taking these medications   amLODipine 5 MG tablet Commonly known as: NORVASC   aspirin EC 81 MG tablet   clopidogrel 75 MG tablet Commonly known as: PLAVIX   losartan 50 MG tablet Commonly known as: COZAAR     TAKE these medications   acetaminophen 325 MG tablet Commonly known as: TYLENOL Take 1-2 tablets (325-650 mg total) by mouth every 6 (six) hours as needed for mild pain (pain score 1-3 or temp > 100.5).   alfuzosin 10 MG 24 hr tablet Commonly known as: UROXATRAL Take 10 mg by mouth daily with breakfast.   atorvastatin 20 MG tablet Commonly known as: LIPITOR Take 20 mg by mouth daily.   donepezil 10 MG tablet Commonly known as: ARICEPT Take 10 mg by mouth at bedtime.   ENSURE ENLIVE PO Take 8 oz by mouth daily.   fexofenadine 60 MG tablet Commonly known as: ALLEGRA Take 60 mg by mouth 2 (two) times daily as needed for allergies or rhinitis.   gabapentin 400 MG capsule Commonly known as: NEURONTIN Take 2 capsules (800 mg total) by mouth at bedtime.   metFORMIN 500 MG tablet Commonly known as: GLUCOPHAGE Take 500 mg by mouth 2 (two) times daily with a meal.   oxyCODONE 5 MG immediate release tablet Commonly known as: Oxy IR/ROXICODONE Take 1 tablet (5 mg total) by mouth every 4 (four) hours as needed for severe pain.   polyethylene glycol 17 g packet Commonly known as: MIRALAX / GLYCOLAX Take 17 g by mouth daily.   senna 8.6 MG Tabs tablet Commonly known as: SENOKOT Take 2 tablets by mouth at bedtime.     sildenafil 25 MG tablet Commonly known as: VIAGRA Take 25 mg by mouth daily as needed for erectile dysfunction.   traZODone 100 MG tablet Commonly known as: DESYREL Take 100 mg by mouth at bedtime.       Disposition and follow-up:   Billy Torres was discharged from [facility] in Stable condition.  At the hospital follow up visit please address:   1.  Please ensure no continued GI bleeding. Please restart aspirin/plavix as PCP feels appropriate. Please restart antihypertensives pending blood pressures. Please assess great R toe injury.  2.  Labs / imaging needed at time of follow-up: CBC, magnesium  3.  Pending labs/ test needing follow-up: na  Follow-up Appointments: Follow-up Information    Angelica Pou, MD Follow up in 1 week(s).   Specialty: Internal Medicine Contact information: [address] [place] [address] [place] [facility] Course by problem list:  1. Acute Lower GI Bleed and Symptomatic Anemia Billy Torres presented to the emergency department after having a bloodied bowel movement and was subsequently admitted for lower GI bleed with symptomatic anemia. Hemoglobin on admission was 10.7, a decreased from 12.9 on 04/28/2019. Patient required 3 units of pRBCs, 1 unit of FFP and 1 unit of platelets on 5/13. Patient was admitted to the ICU on  5/13 for concern of hypovolemic shock. 05/13 CTAP angiography without active bleeding but w/ wall thickening, prominent mucosa in rectum. Abbreviated colonoscopy on 5/14 showed a single rectal ulcer in the distal rectum with stigmata of recent bleeding. Of note, he also had a 35 mm polyp in the cecum that was biopsied and found to be a tubular adenoma without high grade dysplasia. Two other polyps noted but not biopsied. He was transferred back to the floor on 5/15 in stable condition. On 5/16, patient was noted to have additional bloody bowel movements. At the time, his  hemoglobin decreased to 7.2. He received additional 2 units of pRBCs. Flex sigmoidoscopy on 5/16 noted same singular rectal ulcer with stigmata of recent bleeding. Diverticular bleeding was not noted and unlikely to be source. Endo clip placed at that time. Patient had recurrent bleeding early in the morning on 05/18 with hypotension and tachycardia. He was given fluids and 2 U pRBCs and transferred to the progressive floor. Later that day he underwent an NM RBC scan which was negative for active bleeding so flex sig was performed on 05/19. The previously clipped stercoral ulcer was clipped twice more. Per GI note, their impression is that the now reclipped ulcer was the likely cause of the episodes of hematochezia. Patient with stabilized CBC from 9.0 -> 8.3 -> 8.5 today. Blood pressures soft but stable ranging from 107/63 to 122/74 in the last 24 hours. Blood pressure medicines will be reintroduced by physician after discharge.  2. AKI Patient presented with elevated Creatinine of 1.4 up from baseline of 1 which improved quickly with fluids. Last Creatinine .63.  3. Hemiplegia s/p CVA/PAD Patient with extensive vascular disease on dual antiplatelet therapy admitted for acute lower GI bleed. ASA and Plavix held throughout admission. Bleeding has been stabilized for 2 days s/p 3 clips to the same rectal ulcer. GI reports pt can resume plavix 3 days from last clip which would be 05/22. Spoke with PCP who reports she is comfortable reinitiating ASA and Plavix pending follow up CBC after discharge.   Of note, patient with PAD and DM2 and poor wound healing had a minor trauma to his R great toe resulting in a 5 mm x 5 mm blood blister. We didn't want to miss a possible ischemic injury due to the pt having been off the antiplatelet medications, however, the findings do not appear consistent with such an event. He has good pulses and his foot feels well perfused and warm. The patient remembers the event. Injury  relayed to PCP with [facility] for monitoring.   4. HTN Patient with previous diagnosis of HTN on amlodipine and losartan which were held on admission due to AKI and soft blood pressures in setting of acute GI bleed. Blood pressure remained soft throughout admission and patient had multiple episodes of hypotension secondary to bleeding. Neither medication was resumed. Blood pressures are improved but continue to be low normal. Today his lowest BP was 107/63 up to 122/74. Have talked with PCP w/ [name] who is comfortable adding back his antihypertensives as appropriate.   5. Type 2 Diabetes Mellitus  Patient on metformin outpatient. Has had stable glucoses while admitted on SSI.  6. Hypomagnesemia  Patient with multiple lab findings of hypomagnesemia. Magnesium replaced on multiple occasions without much improvement in lab values, likely representing chronic hypomagnesemia secondary to malnutrition. Will have PCP recheck and address outpatient.   Discharge Vitals:   BP 107/63 (BP Location: Right Arm)   Pulse 85  Temp 98.2 F (36.8 C) (Axillary)   Resp 19   Ht 5\' 8"  (1.727 m)   Wt 59.5 kg   SpO2 100%   BMI 19.94 kg/m   Pertinent Labs, Studies, and Procedures:   Results for orders placed or performed during the hospital encounter of 05/20/19 (from the past 24 hour(s))  Glucose, capillary     Status: Abnormal   Collection Time: 05/27/19  3:41 PM  Result Value Ref Range   Glucose-Capillary 153 (H) 70 - 99 mg/dL  Glucose, capillary     Status: Abnormal   Collection Time: 05/27/19  9:06 PM  Result Value Ref Range   Glucose-Capillary 137 (H) 70 - 99 mg/dL  Glucose, capillary     Status: Abnormal   Collection Time: 05/27/19  9:15 PM  Result Value Ref Range   Glucose-Capillary 134 (H) 70 - 99 mg/dL   Comment 1 Notify RN    Comment 2 Document in Chart   SARS CORONAVIRUS 2 (TAT 6-24 HRS) Nasopharyngeal Nasopharyngeal Swab     Status: None   Collection Time: 05/28/19  1:31 AM   Specimen:  Nasopharyngeal Swab  Result Value Ref Range   SARS Coronavirus 2 NEGATIVE NEGATIVE  CBC     Status: Abnormal   Collection Time: 05/28/19  3:46 AM  Result Value Ref Range   WBC 9.4 4.0 - 10.5 K/uL   RBC 2.97 (L) 4.22 - 5.81 MIL/uL   Hemoglobin 8.5 (L) 13.0 - 17.0 g/dL   HCT 25.8 (L) 39.0 - 52.0 %   MCV 86.9 80.0 - 100.0 fL   MCH 28.6 26.0 - 34.0 pg   MCHC 32.9 30.0 - 36.0 g/dL   RDW 15.4 11.5 - 15.5 %   Platelets 328 150 - 400 K/uL   nRBC 0.0 0.0 - 0.2 %  Glucose, capillary     Status: Abnormal   Collection Time: 05/28/19 11:23 AM  Result Value Ref Range   Glucose-Capillary 210 (H) 70 - 99 mg/dL    DG Chest 1 View  Result Date: 05/20/2019 CLINICAL DATA:  Weakness in a patient with rectal bleeding. EXAM: CHEST  1 VIEW COMPARISON:  PA and lateral chest 12/15/2017. FINDINGS: Lungs clear. Heart size normal. No pneumothorax or pleural fluid. No acute bony abnormality. IMPRESSION: Negative chest. Electronically Signed   By: Inge Rise M.D.   On: 05/20/2019 10:34   NM GI Blood Loss  Result Date: 05/25/2019 CLINICAL DATA:  GI bleeding EXAM: NUCLEAR MEDICINE GASTROINTESTINAL BLEEDING SCAN TECHNIQUE: Sequential abdominal images were obtained following intravenous administration of Tc-53m labeled red blood cells. RADIOPHARMACEUTICALS:  24.2 mCi Tc-72m pertechnetate in-vitro labeled red cells. COMPARISON:  None Correlation: CTA abdomen and pelvis 05/20/2019 FINDINGS: Normal blood pool distribution of tracer. No abnormal gastrointestinal localization of tracer identified to suggest active GI bleeding. IMPRESSION: Negative GI bleeding scan. Electronically Signed   By: Lavonia Dana M.D.   On: 05/25/2019 11:16   CT Angio Abd/Pel w/ and/or w/o  Result Date: 05/20/2019 CLINICAL DATA:  64 year old with GI bleeding. EXAM: CT ANGIOGRAPHY ABDOMEN AND PELVIS WITH CONTRAST AND WITHOUT CONTRAST TECHNIQUE: Multidetector CT imaging of the abdomen and pelvis was performed using the standard protocol during  bolus administration of intravenous contrast. Multiplanar reconstructed images and MIPs were obtained and reviewed to evaluate the vascular anatomy. CONTRAST:  15mL OMNIPAQUE IOHEXOL 350 MG/ML SOLN COMPARISON:  CT abdomen 08/16/2016 FINDINGS: VASCULAR Aorta: Atherosclerotic disease in the infrarenal abdominal aorta with a small amount of irregular ulcerative plaque, best seen on sequence  6, image 70. Negative for an aortic aneurysm or significant stenosis. Celiac: Patent without evidence of aneurysm, dissection, vasculitis or significant stenosis. SMA: Patent without evidence of aneurysm, dissection, vasculitis or significant stenosis. Renals: Two right renal arteries are widely patent. Three left renal arteries are widely patent. IMA: Patent Inflow: Atherosclerotic plaque in the right common iliac artery without significant stenosis. 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 Right external iliac artery is patent without significant stenosis. Left common and external iliac arteries are patent without significant stenosis. Plaque and at least mild stenosis involving the left hypogastric artery. Proximal Outflow: Atherosclerotic plaque involving the proximal right femoral arteries. Plaque and at least mild stenosis in the proximal right SFA. Plaque in the left SFA with at least moderate stenosis in the proximal aspect. Veins: Compression of the left common iliac vein from the right common iliac artery which is a normal variant. There is no evidence for iliac vein DVT. IVC and renal veins are patent. Portal venous system is patent. Review of the MIP images confirms the above findings. NON-VASCULAR Lower chest: Lung bases are clear. Hepatobiliary: Normal appearance of the liver. No suspicious liver lesions. Mild distention of the gallbladder without surrounding inflammatory changes. Main hepatic veins are patent. No significant biliary dilatation. Pancreas: Unremarkable. No pancreatic ductal dilatation or surrounding  inflammatory changes. Spleen: Normal in size without focal abnormality. Adrenals/Urinary Tract: Normal appearance of the adrenal glands. A 2.4 cm low-density structure in the right kidney upper pole has Hounsfield units of 10 on the noncontrast images and approximately 25 on the portal venous phase. This is most compatible with a renal cyst with pseudo enhancement. Evidence for additional small renal cysts. No hydronephrosis. Mild distention of the urinary bladder. Stomach/Bowel: Normal appearance of the of the stomach and duodenum without evidence for active bleeding or contrast extravasation. Normal appendix without inflammatory changes. The rectum is distended with a large amount of stool. Mucosal enhancement in the rectum with wall thickening. The wall thickening is well demonstrated on sequence 6, image 124. No evidence for acute hemorrhage or contrast extravasation in the rectum. No evidence for a bowel obstruction. Lymphatic: No abdominopelvic lymphadenopathy. Reproductive: Multiple brachytherapy seeds in the prostate. Other: Small amount of stranding or soft tissue thickening in the presacral region, similar to the examination from 2018. Negative for ascites. Negative for free air. Small periumbilical hernia containing fat. Musculoskeletal: No acute bone abnormality. IMPRESSION: VASCULAR 1. Aortic Atherosclerosis (ICD10-I70.0). Atherosclerotic disease involving the distal abdominal aorta with a small amount of ulcerative plaque. Negative for an abdominal aortic aneurysm. 2. Main visceral arteries are patent.  Multiple renal arteries. 3. 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 4. Atherosclerotic disease with areas of stenosis involving the bilateral proximal superficial femoral arteries. NON-VASCULAR 1. Wall thickening and prominent mucosal enhancement in the rectum. The rectum is also distended with stool. Findings raise concern for proctitis. Proctitis may be associated with the adjacent prostate  brachytherapy seeds. No discrete focus of GI bleeding or active contrast extravasation. 2. Renal cysts. Largest right renal cyst likely has a pseudo enhancement as described. 3. Mild bladder distension without surrounding inflammatory changes. Electronically Signed   By: Markus Daft M.D.   On: 05/20/2019 17:13   Discharge Instructions: Discharge Instructions    Diet - low sodium heart healthy   Complete by: As directed    Increase activity slowly   Complete by: As directed       Signed: Al Decant, MD 05/28/2019, 12:06 PM   Pager: [phone]

## 2019-05-28 NOTE — Addendum Note (Signed)
Addendum  created 05/28/19 0800 by Lynda Rainwater, MD   Attestation recorded in [place], [place] filed

## 2019-05-28 NOTE — TOC Progression Note (Addendum)
Transition of Care [facility]) - Progression Note    Patient Details  Name: Billy Torres MRN: [id number] Date of Birth: 1955-03-30  [facility] CM/SW [place], [place] Phone Number: 05/28/2019, 8:54 AM  Clinical Narrative:    CSW spoke with [facility] and confirmed patient is able to return to SNF today.  CSW spoke with SW [place] to arrange discharge and transportation. Provided SW with nurse contact.   Expected Discharge Plan: [place] Barriers to Discharge: Continued Medical Work up  Expected Discharge Plan and Services Expected Discharge Plan: [place] In-house Referral: Clinical Social Work Discharge Planning Services: CM Consult Post Acute Care Choice: [place] arrangements for the past 2 months: [place], Single Family Home                                       Social Determinants of Health (SDOH) Interventions    Readmission Risk Interventions No flowsheet data found.

## 2019-05-28 NOTE — Progress Notes (Signed)
   05/28/19 1426  Hand-Off documentation  Handoff Given Given to Transfer Unit/facility  Report given to (Full Name) Lenward Chancellor ([place])  Handoff Received Received from shift RN/LPN  Report received from (Full Name) Ewing Schlein   Received discharge order for patient. Discharge instruction given to [facility] transport personnel. PIV removed, intact. Pt off unit in wheelchair. Nursing care complete.

## 2019-05-28 NOTE — TOC Transition Note (Addendum)
Transition of Care [facility]) - CM/SW Discharge Note   Patient Details  Name: Billy Torres MRN: [id number] Date of Birth: 06/03/1955  Transition of Care [facility]) CM/SW Contact:  Jacquelynn Cree Phone Number: 05/28/2019, 12:20 PM   Clinical Narrative:    RN to call report prior to discharge [phone] Room 424).   Patient will DC to: [facility] Anticipated DC date: 5/21 Family notified: Patient declined Transport by: Evette Georges    Per MD patient ready for DC to [facility]. RN, patient, and facility notified of DC. Discharge Summary and FL2 sent to facility. DC packet on chart. [facility] transport confirmed with [place].   CSW will sign off for now as social work intervention is no longer needed. Please consult [place] again if new needs arise.   Final next level of care: [place] Barriers to Discharge: No Barriers Identified   Patient Goals and CMS Choice Patient states their goals for this hospitalization and ongoing recovery are:: To return to [facility] CMS Medicare.gov Compare Post Acute Care list provided to:: Patient Choice offered to / list presented to : Patient  Discharge Placement              Patient chooses bed at: [facility] Patient to be transferred to facility by: [facility]   Patient and family notified of of transfer: 05/28/19  Discharge Plan and Services In-house Referral: Clinical Social Work Discharge Planning Services: CM Consult Post Acute Care Choice: [place]                               Social Determinants of Health (SDOH) Interventions     Readmission Risk Interventions No flowsheet data found.

## 2019-05-28 NOTE — Progress Notes (Signed)
Subjective: Feeling well today. Hopeful to go back to his SNF today. Discussed the blood blister on his toe and he does report trauma from his offloading boot after which he has had some mild pain in the R great toe. He has no other concerns.   Objective:  Vital signs in last 24 hours: Vitals:   05/27/19 2300 05/28/19 0337 05/28/19 0742 05/28/19 1124  BP: 134/72 97/68 122/74 107/63  Pulse: 84 70 81 85  Resp: 20 18 18 19   Temp: 99.1 F (37.3 C) 98.9 F (37.2 C) 98.2 F (36.8 C) 98.2 F (36.8 C)  TempSrc: Oral Oral Oral Axillary  SpO2: 100% 98% 100% 100%  Weight:      Height:       Physical Exam  Constitutional: No distress.  Sitting up in the chair  Cardiovascular: Normal rate, regular rhythm and intact distal pulses.  No murmur heard. Pulmonary/Chest: Breath sounds normal. He is in respiratory distress.  Abdominal: Soft. Bowel sounds are normal. He exhibits no distension.  Musculoskeletal:     Comments: L BKA  Neurological: He is alert.  Skin: Skin is warm and dry. He is not diaphoretic.  5 mm x 5 mm hemorrhagic bulla on the distal tip of his R great toe  Psychiatric: Affect normal.  Nursing note and vitals reviewed.  I/Os:  Intake/Output Summary (Last 24 hours) at 05/28/2019 1246 Last data filed at 05/28/2019 1221 Gross per 24 hour  Intake 350 ml  Output 1290 ml  Net -940 ml    Telemetry:  Labs: Results for orders placed or performed during the hospital encounter of 05/20/19 (from the past 24 hour(s))  Glucose, capillary     Status: Abnormal   Collection Time: 05/27/19  3:41 PM  Result Value Ref Range   Glucose-Capillary 153 (H) 70 - 99 mg/dL  Glucose, capillary     Status: Abnormal   Collection Time: 05/27/19  9:06 PM  Result Value Ref Range   Glucose-Capillary 137 (H) 70 - 99 mg/dL  Glucose, capillary     Status: Abnormal   Collection Time: 05/27/19  9:15 PM  Result Value Ref Range   Glucose-Capillary 134 (H) 70 - 99 mg/dL   Comment 1 Notify RN    Comment 2 Document in Chart   SARS CORONAVIRUS 2 (TAT 6-24 HRS) Nasopharyngeal Nasopharyngeal Swab     Status: None   Collection Time: 05/28/19  1:31 AM   Specimen: Nasopharyngeal Swab  Result Value Ref Range   SARS Coronavirus 2 NEGATIVE NEGATIVE  CBC     Status: Abnormal   Collection Time: 05/28/19  3:46 AM  Result Value Ref Range   WBC 9.4 4.0 - 10.5 K/uL   RBC 2.97 (L) 4.22 - 5.81 MIL/uL   Hemoglobin 8.5 (L) 13.0 - 17.0 g/dL   HCT 25.8 (L) 39.0 - 52.0 %   MCV 86.9 80.0 - 100.0 fL   MCH 28.6 26.0 - 34.0 pg   MCHC 32.9 30.0 - 36.0 g/dL   RDW 15.4 11.5 - 15.5 %   Platelets 328 150 - 400 K/uL   nRBC 0.0 0.0 - 0.2 %  Glucose, capillary     Status: Abnormal   Collection Time: 05/28/19 11:23 AM  Result Value Ref Range   Glucose-Capillary 210 (H) 70 - 99 mg/dL    Imaging: No results found.  Assessment/Plan:  Assessment:  Plan: Principal Problem:   Hematochezia Active Problems:   PVD (peripheral vascular disease) (HCC)   Pressure injury of skin  Acute blood loss anemia   Aortic atherosclerosis (HCC)  #Acute lower GI bleed Pt presenting w/ acute L GI bleed from rectal ulcer now s/p 7 U pRBCS, 1 U FFP, 1 U platelets, one colonoscopy and 2 flexible sigmoidoscopies and a NM RBC scan after repeated episodes of rebleeding.. Since 05/19 after third and largest clip placed on rectal ulcer, pt has remained HDS w/ out further bleeding. Plan to dc back to SNF today.   #Hemiplegia s/p CVA #PAD #Hyperlipidemia - continuing atorvastatin. - Holding Aspirin 81 mg. - Holding Plavix.   #HTN -Patient remains normotensive. Holding antihypertensives given soft Bps.  # T2DM  - On metformin as outpatient. -SSI while admitted.  Dispo: Anticipated discharge today.  Al Decant, MD 05/28/2019, 12:46 PM Pager: [phone]

## 2019-05-28 NOTE — Discharge Instructions (Signed)
You were admitted to the hospital for lower GI bleed.  You were given fluids and blood products for symptomatic blood loss resulting in elevated heart rate and low blood pressure.  You underwent a colonoscopy and 2 flexible sigmoidoscopies.  A rectal ulcer was identified and clipped.  After another episode of bleeding, the rectal ulcer was clipped 2 additional times.  Your blood counts have since remained stable as have your blood pressure and heart rate.  You were taken off your antiplatelet medications to avoid increase in bleeding.  Those will be restarted by your primary care doctor.  You were taken off your blood pressure medications due to low blood pressures.  This will be restarted by her primary care physician.  You will be discharged back to the skilled nursing facility from which she came.  It was a pleasure meeting you.

## 2019-05-29 LAB — GLUCOSE, CAPILLARY: Glucose-Capillary: 117 mg/dL — ABNORMAL HIGH (ref 70–99)

## 2019-06-09 ENCOUNTER — Ambulatory Visit: Payer: Medicare (Managed Care) | Admitting: Family

## 2019-06-10 ENCOUNTER — Telehealth: Payer: Self-pay | Admitting: Gastroenterology

## 2019-06-11 ENCOUNTER — Telehealth: Payer: Self-pay | Admitting: Gastroenterology

## 2019-06-11 NOTE — Telephone Encounter (Signed)
08/12/19 at 1030 am appt with Dr Lac[facility] [phone] have been advised of the appt information.

## 2019-06-11 NOTE — Telephone Encounter (Signed)
Mansouraty, Telford Nab., MD  Timothy Lasso, RN   Cc: Doran Stabler, MD  Mallie Mussel, Agree with need for resection. Nhung Danko, please move forward with scheduling a clinic visit to discuss his recent hospitalization and findings on colonoscopy with need for EMR. We will need to find out who manages his Plavix as well so that we can get approval for coming off of that prior to his colonoscopy with EMR. We will not move forward with scheduling that until after he has been seen in clinic. Please let Dr. Loletha Carrow and I know when he is scheduled for a clinic visit. Thanks. GM

## 2019-07-02 ENCOUNTER — Encounter: Payer: Self-pay | Admitting: Surgery

## 2019-08-12 ENCOUNTER — Other Ambulatory Visit (INDEPENDENT_AMBULATORY_CARE_PROVIDER_SITE_OTHER): Payer: Medicare (Managed Care)

## 2019-08-12 ENCOUNTER — Encounter: Payer: Self-pay | Admitting: Gastroenterology

## 2019-08-12 ENCOUNTER — Ambulatory Visit (INDEPENDENT_AMBULATORY_CARE_PROVIDER_SITE_OTHER): Payer: Medicare (Managed Care) | Admitting: Gastroenterology

## 2019-08-12 VITALS — BP 110/58 | HR 77

## 2019-08-12 DIAGNOSIS — Z86010 Personal history of colonic polyps: Secondary | ICD-10-CM | POA: Diagnosis not present

## 2019-08-12 DIAGNOSIS — Z862 Personal history of diseases of the blood and blood-forming organs and certain disorders involving the immune mechanism: Secondary | ICD-10-CM | POA: Diagnosis not present

## 2019-08-12 DIAGNOSIS — Z8719 Personal history of other diseases of the digestive system: Secondary | ICD-10-CM

## 2019-08-12 DIAGNOSIS — K635 Polyp of colon: Secondary | ICD-10-CM | POA: Diagnosis not present

## 2019-08-12 LAB — BASIC METABOLIC PANEL
BUN: 17 mg/dL (ref 6–23)
CO2: 29 mEq/L (ref 19–32)
Calcium: 9.5 mg/dL (ref 8.4–10.5)
Chloride: 105 mEq/L (ref 96–112)
Creatinine, Ser: 1.02 mg/dL (ref 0.40–1.50)
GFR: 73.22 mL/min (ref >60.00)
Glucose, Bld: 183 mg/dL — ABNORMAL HIGH (ref 70–99)
Potassium: 4.5 mEq/L (ref 3.5–5.1)
Sodium: 138 mEq/L (ref 135–145)

## 2019-08-12 LAB — IBC + FERRITIN
Ferritin: 18 ng/mL — ABNORMAL LOW (ref 22.0–322.0)
Iron: 74 ug/dL (ref 42–165)
Saturation Ratios: 16.7 % — ABNORMAL LOW (ref 20.0–50.0)
Transferrin: 317 mg/dL (ref 212.0–360.0)

## 2019-08-12 LAB — PROTIME-INR
INR: 1 ratio (ref 0.8–1.0)
Prothrombin Time: 11.3 s (ref 9.6–13.1)

## 2019-08-12 LAB — CBC
HCT: 40 % (ref 39.0–52.0)
Hemoglobin: 13 g/dL (ref 13.0–17.0)
MCHC: 32.5 g/dL (ref 30.0–36.0)
MCV: 85.7 fl (ref 78.0–100.0)
Platelets: 241 10*3/uL (ref 150.0–400.0)
RBC: 4.67 Mil/uL (ref 4.22–5.81)
RDW: 12.7 % (ref 11.5–15.5)
WBC: 8.4 10*3/uL (ref 4.0–10.5)

## 2019-08-12 MED ORDER — SUPREP BOWEL PREP KIT 17.5-3.13-1.6 GM/177ML PO SOLN
1.0000 | ORAL | 0 refills | Status: DC
Start: 2019-08-12 — End: 2020-02-08

## 2019-08-12 NOTE — Patient Instructions (Signed)
You have been scheduled for a colonoscopy. Please follow written instructions given to you at your visit today.  Please pick up your prep supplies at the pharmacy within the next 1-3 days. If you use inhalers (even only as needed), please bring them with you on the day of your procedure.   Your provider has requested that you go to the basement level for lab work before leaving today. Press "B" on the elevator. The lab is located at the first door on the left as you exit the elevator.  We have sent the following medications to your pharmacy for you to pick up at your convenience: [place] will be contaced by our office prior to your procedure for directions on holding your Plavix.  If you do not hear from our office 1 week prior to your scheduled procedure, please call [phone] to discuss.   Due to recent changes in healthcare laws, you may see the results of your imaging and laboratory studies on MyChart before your provider has had a chance to review them.  We understand that in some cases there may be results that are confusing or concerning to you. Not all laboratory results come back in the same time frame and the provider may be waiting for multiple results in order to interpret others.  Please give [place] 48 hours in order for your provider to thoroughly review all the results before contacting the office for clarification of your results.   Thank you for choosing me and [facility].  Dr. Rush Landmark

## 2019-08-13 ENCOUNTER — Telehealth: Payer: Self-pay | Admitting: Gastroenterology

## 2019-08-13 NOTE — Telephone Encounter (Signed)
Dr. Rush Landmark - FYI, I called and spoke with Noreene Larsson at [facility] to get some direction on who or how to get approval to hold Plavix x 5 days prior to colonoscopy. She informed me that pt is no longer taking Plavix. She is going to fax his current medication list to me for our records. She also confirmed that she received our notes from yesterday and will follow as directed. She voiced understanding of colonoscopy being scheduled at the hospital  and preparation needed prior to procedure.

## 2019-08-13 NOTE — Telephone Encounter (Signed)
Noted. Thank you Ashley.

## 2019-08-13 NOTE — Telephone Encounter (Signed)
Good to know and update the medications. We will move forward with procedures as scheduled. GM

## 2019-08-15 DIAGNOSIS — K635 Polyp of colon: Secondary | ICD-10-CM | POA: Insufficient documentation

## 2019-08-15 DIAGNOSIS — Z86010 Personal history of colonic polyps: Secondary | ICD-10-CM | POA: Insufficient documentation

## 2019-08-15 DIAGNOSIS — Z8719 Personal history of other diseases of the digestive system: Secondary | ICD-10-CM | POA: Insufficient documentation

## 2019-08-15 DIAGNOSIS — Z862 Personal history of diseases of the blood and blood-forming organs and certain disorders involving the immune mechanism: Secondary | ICD-10-CM | POA: Insufficient documentation

## 2019-08-15 NOTE — Progress Notes (Signed)
[place] VISIT   Primary Care Provider Patient, No Pcp Per No address on file None  Referring Provider Dr. Loletha Carrow  Patient Profile: Billy Torres is a 64 y.o. male with a pmh significant for status post CVA (possibly still on Plavix as well as on aspirin), chronic renal insufficiency, peripheral arterial disease, prior prostate cancer, diabetes, diverticulosis, prior colon polyps (previous history of large cecal polyp and lost to follow-up since 2007), recent stercoral ulceration leading to significant bleeding and hospitalization.  The patient presents to the [facility] for an evaluation and management of problem(s) noted below:  Problem List 1. Cecal polyp   2. History of rectal ulcer   3. History of anemia   4. History of colonic polyps     History of Present Illness Please see initial consultation note by PA Glyn Ade and MD Danis for full details of patient's presentation to the hospital in May 2021.  In brief, the patient presented with bright red blood per rectum.  He ended up undergoing endoscopic evaluation finding stercoral ulceration in the rectum felt to be secondary to constipation.  He was also found to have a very large colon polyp in the cecum which was felt to potentially be a recurrent polyp.  In 2006 a large cecal 3.5 cm sessile polyp was removed in piecemeal per report.  In 2007 follow-up was performed.  No follow-up colonoscopies were performed.  The patient ended up undergoing 2 flexible sigmoidoscopies for attempt at treatment of the rectal ulceration/stercoral ulceration that had recurrently bled.  He was to be discharged and eventually restart Plavix.  Interval History Today, the patient comes in for his first follow-up with GI after being seen by his PCP through [facility].  Repeat blood counts to evaluate for how his anemia has done has not been completed that I can see in the system.  The patient states he is not having  any further issues.  He is having a bowel movement daily.  We do not have the patient's medications list completely but he believes he is still on Plavix.  He knows that he is taking an aspirin.  Patient denies any nonsteroidals.  He is not having any recurrent bleeding.  GI Review of Systems Positive as above Negative for dysphagia, odynophagia, nausea, vomiting, pain, change in bowel habits, melena  Review of Systems General: Positive for weight loss though he cannot comment exactly how much; denies fevers/chills Cardiovascular: Denies chest pain/palpitations Pulmonary: Denies shortness of breath Gastroenterological: See HPI Genitourinary: Denies darkened urine Hematological: Positive for easy bruising/bleeding due to his medications he states Dermatological: Denies jaundice Psychological: Mood is stable   Medications Current Outpatient Medications  Medication Sig Dispense Refill  . acetaminophen (TYLENOL) 325 MG tablet Take 1-2 tablets (325-650 mg total) by mouth every 6 (six) hours as needed for mild pain (pain score 1-3 or temp > 100.5).    Marland Kitchen alfuzosin (UROXATRAL) 10 MG 24 hr tablet Take 10 mg by mouth daily with breakfast.    . atorvastatin (LIPITOR) 20 MG tablet Take 20 mg by mouth daily.     Marland Kitchen donepezil (ARICEPT) 10 MG tablet Take 10 mg by mouth at bedtime.    . fexofenadine (ALLEGRA) 60 MG tablet Take 60 mg by mouth 2 (two) times daily as needed for allergies or rhinitis.    Marland Kitchen gabapentin (NEURONTIN) 400 MG capsule Take 2 capsules (800 mg total) by mouth at bedtime.    . metFORMIN (GLUCOPHAGE) 500 MG tablet Take  500 mg by mouth 2 (two) times daily with a meal.     . Nutritional Supplements (ENSURE ENLIVE PO) Take 8 oz by mouth daily.    Marland Kitchen oxyCODONE (OXY IR/ROXICODONE) 5 MG immediate release tablet Take 1 tablet (5 mg total) by mouth every 4 (four) hours as needed for severe pain. 30 tablet 0  . sildenafil (VIAGRA) 25 MG tablet Take 25 mg by mouth daily as needed for erectile  dysfunction.    . traZODone (DESYREL) 100 MG tablet Take 100 mg by mouth at bedtime.    Manus Gunning BOWEL PREP KIT 17.5-3.13-1.6 GM/177ML SOLN Take 1 kit by mouth as directed. For colonoscopy prep 354 mL 0   No current facility-administered medications for this visit.    Allergies No Known Allergies  Histories Past Medical History:  Diagnosis Date  . Abnormal rate of speech    post stroke  . Anticoagulant long-term use    plavix  . Arthritis of left shoulder region   . Chronic constipation   . Chronic kidney disease    chronic kidney disease   . Cognitive deficit due to old cerebral infarction   . DDD (degenerative disc disease), cervical   . Degenerative cervical spinal stenosis   . Dysphagia   . Gait disturbance, post-stroke    uses cane  . History of ischemic left MCA stroke 06/27/2013   w/ evidence hemorrhagia, high grade stenosis bilateral proximal MCA and extensive small matter disease--- symptoms resolved  . History of ischemic stroke without residual deficits 05-14-2015---  residual left hemiparesis and cognitive deficits   small nonhemorrhegic infarct right corona radiata posterior limb and progressive right m2 stenosis  . Hyperlipidemia   . Hypertension   . Insomnia   . Internal hemorrhoids   . Left hemiparesis (HCC)    RESIDUAL FROM STROKE--  WALKS W/ CANE  . Lower GI bleed   . Mild atherosclerosis of carotid artery, right    RICA 0-39% per duplex 06-28-2013  . OA (osteoarthritis)    LEFT SHOULDER  . Painful bladder spasm   . Peripheral arterial disease ([place])   . Prostate cancer [facility]) urologist-  dr Gaynelle Arabian  oncologist-  dr [name]   dx 12/ 2017-- Stage T1c, Gleason 3+4,  PSA 10.4,  vol 51cc  . Sensorineural hearing loss, bilateral   . Type 2 diabetes mellitus treated with insulin ([place])   . Urge incontinence    w frequency, wears depends  . Urinary retention    Previously required indwelling Foley  . Wears dentures    Past Surgical History:   Procedure Laterality Date  . ABDOMINAL AORTOGRAM W/LOWER EXTREMITY N/A 12/11/2018   Procedure: ABDOMINAL AORTOGRAM W/LOWER EXTREMITY;  Surgeon: Angelia Mould, MD;  Location: [place] [facility];  Service: Cardiovascular;  Laterality: N/A;  . ABDOMINAL AORTOGRAM W/LOWER EXTREMITY Bilateral 04/02/2019   Procedure: ABDOMINAL AORTOGRAM W/LOWER EXTREMITY;  Surgeon: Angelia Mould, MD;  Location: [place] [facility];  Service: Cardiovascular;  Laterality: Bilateral;  . AMPUTATION Left 04/28/2019   Procedure: LEFT BELOW KNEE AMPUTATION;  Surgeon: Newt Minion, MD;  Location: [place];  Service: Orthopedics;  Laterality: Left;  . ANTERIOR CERVICAL DECOMP/DISCECTOMY FUSION  11/27/1999   C5 -- C6  . BELOW KNEE LEG AMPUTATION Left 04/28/2019  . BIOPSY  05/21/2019   Procedure: BIOPSY;  Surgeon: Doran Stabler, MD;  Location: [facility] ENDOSCOPY;  Service: Gastroenterology;;  . Denton Meek  10/07/2001  . COLONOSCOPY WITH PROPOFOL N/A 05/21/2019   Procedure: COLONOSCOPY WITH  PROPOFOL;  Surgeon: Doran Stabler, MD;  Location: [place];  Service: Gastroenterology;  Laterality: N/A;  . CYSTOSCOPY WITH INSERTION OF UROLIFT N/A 08/11/2017   Procedure: CYSTOSCOPY WITH INSERTION OF UROLIFT;  Surgeon: Cleon Gustin, MD;  Location: [facility];  Service: Urology;  Laterality: N/A;  . FLEXIBLE SIGMOIDOSCOPY N/A 05/23/2019   Procedure: FLEXIBLE SIGMOIDOSCOPY;  Surgeon: Doran Stabler, MD;  Location: [place];  Service: Gastroenterology;  Laterality: N/A;  . FLEXIBLE SIGMOIDOSCOPY N/A 05/26/2019   Procedure: FLEXIBLE SIGMOIDOSCOPY;  Surgeon: Milus Banister, MD;  Location: [facility] ENDOSCOPY;  Service: Endoscopy;  Laterality: N/A;  . FOOT SURGERY  2005  . HEMOSTASIS CLIP PLACEMENT  05/23/2019   Procedure: HEMOSTASIS CLIP PLACEMENT;  Surgeon: Doran Stabler, MD;  Location: [facility] ENDOSCOPY;  Service: Gastroenterology;;  . HEMOSTASIS CLIP PLACEMENT  05/26/2019   Procedure: HEMOSTASIS CLIP PLACEMENT;   Surgeon: Milus Banister, MD;  Location: [place];  Service: Endoscopy;;  . PERIPHERAL VASCULAR BALLOON ANGIOPLASTY  12/11/2018   Procedure: PERIPHERAL VASCULAR BALLOON ANGIOPLASTY;  Surgeon: Angelia Mould, MD;  Location: [place] [facility];  Service: Cardiovascular;;  Lt. SFA  . PROSTATE BIOPSY  12/12/2015  . RADIOACTIVE SEED IMPLANT N/A 05/30/2016   Procedure: RADIOACTIVE SEED IMPLANT/BRACHYTHERAPY IMPLANT WITH SPACE OAR;  Surgeon: Carolan Clines, MD;  Location[facility];  Service: Urology;  Laterality: N/A;  . SHOULDER ARTHROSCOPY WITH SUBACROMIAL DECOMPRESSION Left 11/28/2006   Debridement/ Acromioplasty/  CA ligament relesase  . TRANSTHORACIC ECHOCARDIOGRAM  05/15/2015   mild LVH,  ef 65-70%, codominant mitral inflow-iindeterminate diastolic function/     Social History   Socioeconomic History  . Marital status: Divorced    Spouse name: Not on file  . Number of children: Not on file  . Years of education: Not on file  . Highest education level: Not on file  Occupational History  . Not on file  Tobacco Use  . Smoking status: Former Smoker    Packs/day: 0.25    Years: 45.00    Pack years: 11.25    Types: Cigarettes  . Smokeless tobacco: Never Used  Vaping Use  . Vaping Use: Never used  Substance and Sexual Activity  . Alcohol use: No  . Drug use: No  . Sexual activity: Yes  Other Topics Concern  . Not on file  Social History Narrative  . Not on file   Social Determinants of Health   Financial Resource Strain:   . Difficulty of Paying Living Expenses:   Food Insecurity:   . Worried About Charity fundraiser in the Last Year:   . Arboriculturist in the Last Year:   Transportation Needs:   . Film/video editor (Medical):   Marland Kitchen Lack of Transportation (Non-Medical):   Physical Activity:   . Days of Exercise per Week:   . Minutes of Exercise per Session:   Stress:   . Feeling of Stress :   Social Connections:   . Frequency of  Communication with Friends and Family:   . Frequency of Social Gatherings with Friends and Family:   . Attends Religious Services:   . Active Member of Clubs or Organizations:   . Attends Archivist Meetings:   Marland Kitchen Marital Status:   Intimate Partner Violence:   . Fear of Current or Ex-Partner:   . Emotionally Abused:   Marland Kitchen Physically Abused:   . Sexually Abused:    Family History  Problem Relation Age of Onset  .  Hypertension Mother   . Hypertension Father   . Cancer Brother        prostate   I have reviewed his medical, social, and family history in detail and updated the electronic medical record as necessary.    PHYSICAL EXAMINATION  BP (!) 110/58 (BP Location: Left Arm, Patient Position: Sitting, Cuff Size: Normal)   Pulse 77   SpO2 97%  Wt Readings from Last 3 Encounters:  05/21/19 131 lb 2.8 oz (59.5 kg)  04/28/19 146 lb (66.2 kg)  04/02/19 155 lb (70.3 kg)  GEN: NAD, appears stated age, doesn't appear chronically ill PSYCH: Cooperative, without pressured speech EYE: Conjunctivae pink, sclerae anicteric ENT: MMM CV: Nontachycardic RESP: No audible wheezing GI: NABS, soft, NT/ND, without rebound or guarding SKIN: No jaundice NEURO:  Alert & Oriented x 3, no focal deficits   REVIEW OF DATA  I reviewed the following data at the time of this encounter:  GI Procedures and Studies  May 2021 colonoscopy - One 35 mm polyp in the cecum. Biopsied. - One 6 mm polyp in the ascending colon. - One 6 mm polyp in the rectum. - A single (solitary) ulcer in the distal rectum. - Stercoral ulcer from constipation was the source of bleeding that has now resolved. - The examination was otherwise normal.  May 2021 flex sig - Preparation of the colon was fair. - Diverticulosis in the sigmoid colon. - A single (solitary) ulcer in the distal rectum. Clip (MR conditional) was placed. - One 6 mm polyp in the distal rectum. RECTAL ULCER BELIEVED TO HAVE BEEN THE SOURCE OF  BLEEDING ON BOTH OCCASIONS. If diverticulosis had bled this morning, there would have been blood in the colon since no bowel preparation was given.  May 2021 flex sig - The site of 5/14 distal rectal ulcer and then 5/16 clip placement at the ulcer site was located and evaluated. The clip was in good position, the ulcer was buried under the clip and tented up mucosa. At one site of the clip the mucosa was a bit cherry red and protuberent. Not classic for a visible vessel at all. Given his significant, overt rebleeding 1-2 days after the clip was placed on 5/16 I decided to place another 11 mm clip at the site.  This precipitated impressive, brisk bleeding which was a pulsatile but not spurting. I placed another, larger (17 mm) clip at the site and achieved immediate hemostasis. - Given the impressive, brisk bleeding at the site I am quite convinced that this has indeed been the source of his overt rectal bleeding. It is presumed to be from a stercoral ulcer. There are now three clips at the site. The third clips resulted in immediate, complete hemostasis while he was actively briskly bleeding and so I doubt he will bleed from there again. Need to keep his bowel soft for then next several weeks.  Previous colonoscopies 2006 Colonoscopy.  Small right sided polyps, snare and cauterized, not retrieved.  3.5 cm sessile polyp at cecum piecemeal removed, retrieved. 5 mm sessile polyp at sigmoid removed, not retrieved.  Path cecal polyp: adenoma w HG dysplasia.   2007 Colonoscopy: 5 mm sessile polyp removed, retrieved from sigmoid.  Path: hyperplastic.  Laboratory Studies  Reviewed those in epic  Imaging Studies  May 2021 CT abdomen pelvis-angiography IMPRESSION: VASCULAR 1. Aortic Atherosclerosis (ICD10-I70.0). Atherosclerotic disease involving the distal abdominal aorta with a small amount of ulcerative plaque. Negative for an abdominal aortic aneurysm. 2. Main visceral arteries are  patent.  Multiple  renal arteries. 3. 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 4. Atherosclerotic disease with areas of stenosis involving the bilateral proximal superficial femoral arteries. NON-VASCULAR 1. Wall thickening and prominent mucosal enhancement in the rectum. The rectum is also distended with stool. Findings raise concern for proctitis. Proctitis may be associated with the adjacent prostate brachytherapy seeds. No discrete focus of GI bleeding or active contrast extravasation. 2. Renal cysts. Largest right renal cyst likely has a pseudo enhancement as described. 3. Mild bladder distension without surrounding inflammatory changes.  May 2021 tagged RBC scan IMPRESSION: Negative GI bleeding scan.  ASSESSMENT  Mr. Tucholski is a 64 y.o. male with a pmh significant for status post CVA (possibly still on Plavix as well as on aspirin), chronic renal insufficiency, peripheral arterial disease, prior prostate cancer, diabetes, diverticulosis, prior colon polyps (previous history of large cecal polyp and lost to follow-up since 2007), recent stercoral ulceration leading to significant bleeding and hospitalization.  The patient is seen today for evaluation and management of:  1. Cecal polyp   2. History of rectal ulcer   3. History of anemia   4. History of colonic polyps    Patient seems to be clinically and hemodynamically stable.  He is not had any recurrence of bleeding from his stercoral ulceration.  In regards to the referral from my partner Dr. Loletha Carrow, the cecal polyp remains and needs further evaluation.  Based upon the description and endoscopic pictures I do feel that it is reasonable to pursue an Advanced Polypectomy attempt of the polyp/lesion.  We discussed some of the techniques of advanced polypectomy which include Endoscopic Mucosal Resection, OVESCO Full-Thickness Resection, Endorotor Morcellation, and Tissue Ablation via Fulguration.  The risks and benefits of endoscopic evaluation were  discussed with the patient; these include but are not limited to the risk of perforation, infection, bleeding, missed lesions, lack of diagnosis, severe illness requiring hospitalization, as well as anesthesia and sedation related illnesses.  During attempts at advanced resection, the risks of bleeding and perforation/leak are increased as opposed to diagnostic and screening procedures, and that was discussed with the patient as well.   In addition, I explained that with the possible need for piecemeal resection, subsequent short-interval endoscopic evaluation for follow up and potential retreatment of the lesion/area may be necessary.  I did offer, a referral to surgery in order for patient to have opportunity to discuss surgical management/intervention prior to finalizing decision for attempt at endoscopic removal, however, the patient deferred on this.  If, after attempt at removal of the polyp/lesion, it is found that the patient has a complication or that an invasive lesion or malignant lesion is found, or that the polyp/lesion continues to recur, the patient is aware and understands that surgery may still be indicated/required.  We need to get his medication list and ensure that if he is still on Plavix that we then get approval for him coming off of it otherwise if he is not on Plavix then we will be able to move forward more easily with his EMR attempt.  All patient questions were answered, to the best of my ability, and the patient agrees to the aforementioned plan of action with follow-up as indicated.   PLAN  Laboratories as outlined below to follow-up previous anemia Preprocedure labs to be obtained Obtain patient medication list and if he remains on Plavix will need to get approval to come off for 5 days Proceed with scheduling colonoscopy with EMR Continue stool softeners daily Continue  fiber supplementation daily MiraLAX 1-2 times daily as needed to maintain a bowel movement to decrease risk  of recurrent stercoral ulceration   Orders Placed This Encounter  Procedures  . Procedural/ Surgical Case Request: COLONOSCOPY WITH PROPOFOL, ENDOSCOPIC MUCOSAL RESECTION  . CBC  . Basic Metabolic Panel (BMET)  . IBC + Ferritin  . INR/PT  . Ambulatory referral to Gastroenterology    New Prescriptions   SUPREP BOWEL PREP KIT 17.5-3.13-1.6 GM/177ML SOLN    Take 1 kit by mouth as directed. For colonoscopy prep   Modified Medications   No medications on file    Planned Follow Up No follow-ups on file.   Total Time in Face-to-Face and in Coordination of Care for patient including independent/personal interpretation/review of prior testing, medical history, examination, medication adjustment, communicating results with the patient directly, and documentation with the EHR is 30 minutes.   Justice Britain, MD [facility] Advanced Endoscopy Office # [phone]

## 2019-08-16 ENCOUNTER — Telehealth: Payer: Self-pay

## 2019-08-16 MED ORDER — FERROUS GLUCONATE 324 (38 FE) MG PO TABS
324.0000 mg | ORAL_TABLET | Freq: Every day | ORAL | 3 refills | Status: DC
Start: 2019-08-16 — End: 2020-08-15

## 2019-08-16 NOTE — Telephone Encounter (Addendum)
EGD has been added to Colon +EMR already scheduled for 10/6 @ [place]. Ferrous Gluconate -QD( rx sent to pharmacy). Start Mialax (1) capful daily so that bowels stay soft in effort of decreasing recurrence of rectal ulceration. Unable to leave message on pt's cell as v/m box was full. Orders will also be faxed to [facility]. I have spoken with Rise Paganini- NP at [facility] and informed her of the additional recommendations.

## 2019-10-09 ENCOUNTER — Inpatient Hospital Stay (HOSPITAL_COMMUNITY): Admission: RE | Admit: 2019-10-09 | Payer: Medicare (Managed Care) | Source: Ambulatory Visit

## 2019-10-11 ENCOUNTER — Telehealth: Payer: Self-pay | Admitting: Gastroenterology

## 2019-10-11 ENCOUNTER — Other Ambulatory Visit (HOSPITAL_COMMUNITY): Payer: Medicare (Managed Care)

## 2019-10-11 NOTE — Telephone Encounter (Signed)
Thanks RO!!

## 2019-10-11 NOTE — Telephone Encounter (Signed)
[facility] states the pt missed his covid test scheduled for Saturday, pt would like to reschedule if possible.

## 2019-10-11 NOTE — Telephone Encounter (Signed)
[facility] informed to take patient today for Covid Test asap. Joey, please in the future if its something like this please call me to see if I am able to take call. Also there was no number listed from who called from [facility], so when I called back with the number I have listed I was transferred 5 times before they even realize what I was calling back for. Patty I already took care of it, but the only phone number I have for [facility] is [phone].

## 2019-10-11 NOTE — Telephone Encounter (Signed)
Do you have a phone number for [facility]?

## 2019-10-12 ENCOUNTER — Telehealth: Payer: Self-pay | Admitting: Gastroenterology

## 2019-10-12 NOTE — Telephone Encounter (Signed)
Billy Torres returned call. Wrong information was given on yesterday. Per Billy Torres pt had Covid vaccine on yesterday. Son did take pt on Sat as instructed.  I informed her that pt still has to have Covid testing regardless of vaccine per hospital protocol, son was advised of this as well.  Pt has been r/s for 12/20/19 [facility]  @ 7:30am TOA 6:00am. Covid test 12/16/19 at 11:00am. New instructions have been faxed to [facility] at [place] to [phone].

## 2019-10-12 NOTE — Telephone Encounter (Signed)
Returned call to [place] at [place]. Had to leave message. I spoke with someone at [facility] on yesterday and was informed that pt would be taken get covid test on yesterday.

## 2019-10-12 NOTE — Progress Notes (Signed)
Tried multiple times to reach pt regarding his appt tomorrow 10/13/19 and the need for a COVID screening to be done. Left multiple voicemail's on home and cell phone. Patti at Dr. Donneta Romberg office made aware that the procedure will have to be cancelled if COVID screening has not been completed. Patti verbalized understanding. Jobe Igo, RN

## 2019-10-12 NOTE — Telephone Encounter (Signed)
Billy Torres from [facility] calling in regards to this message. Pt did not have covid test previous his procedure tomorrow. I told her that pt needs to be r/s since he missed his appt. Pls call Jacki to r/s pt's procedure. Her phone is [phone].

## 2019-12-16 ENCOUNTER — Other Ambulatory Visit (HOSPITAL_COMMUNITY)
Admission: RE | Admit: 2019-12-16 | Discharge: 2019-12-16 | Disposition: A | Discharge status: Home / Self Care | Payer: Medicare (Managed Care) | Source: Ambulatory Visit | Attending: Gastroenterology | Admitting: Gastroenterology

## 2019-12-16 DIAGNOSIS — Z01812 Encounter for preprocedural laboratory examination: Secondary | ICD-10-CM | POA: Insufficient documentation

## 2019-12-16 DIAGNOSIS — Z20822 Contact with and (suspected) exposure to covid-19: Secondary | ICD-10-CM | POA: Insufficient documentation

## 2019-12-16 LAB — SARS CORONAVIRUS 2 (TAT 6-24 HRS): SARS Coronavirus 2: NEGATIVE

## 2019-12-17 ENCOUNTER — Encounter (HOSPITAL_COMMUNITY): Payer: Self-pay | Admitting: Gastroenterology

## 2019-12-17 ENCOUNTER — Other Ambulatory Visit: Payer: Self-pay

## 2019-12-17 NOTE — Progress Notes (Addendum)
Mr. Billy Torres denies chest pain or shortness of breath. Patient will be transported by [facility]. Mr. Billy Torres could not find medication list, I instructed patient to not take any mediation on Monday am except Tylenol if needed.  Mr. Billy Torres reports that  CBG machine is not working properly.  I asked Mr. Billy Torres if he has someone to stay with him for 24 hours after procedure, patient reports, "I think so".  I instructed patient to confirm a name and number of the person that will be with him.

## 2019-12-20 ENCOUNTER — Encounter (HOSPITAL_COMMUNITY): Payer: Self-pay | Admitting: Gastroenterology

## 2019-12-20 ENCOUNTER — Ambulatory Visit (HOSPITAL_COMMUNITY): Payer: Medicare (Managed Care) | Admitting: Anesthesiology

## 2019-12-20 ENCOUNTER — Ambulatory Visit (HOSPITAL_COMMUNITY)
Admission: RE | Admit: 2019-12-20 | Discharge: 2019-12-20 | Disposition: A | Discharge status: Home / Self Care | Payer: Medicare (Managed Care) | Attending: Gastroenterology | Admitting: Gastroenterology

## 2019-12-20 ENCOUNTER — Other Ambulatory Visit: Payer: Self-pay

## 2019-12-20 ENCOUNTER — Encounter (HOSPITAL_COMMUNITY)
Admission: RE | Disposition: A | Discharge status: Home / Self Care | Payer: Self-pay | Source: Home / Self Care | Attending: Gastroenterology

## 2019-12-20 DIAGNOSIS — Z794 Long term (current) use of insulin: Secondary | ICD-10-CM | POA: Insufficient documentation

## 2019-12-20 DIAGNOSIS — D120 Benign neoplasm of cecum: Secondary | ICD-10-CM | POA: Insufficient documentation

## 2019-12-20 DIAGNOSIS — Z8546 Personal history of malignant neoplasm of prostate: Secondary | ICD-10-CM | POA: Diagnosis not present

## 2019-12-20 DIAGNOSIS — I69319 Unspecified symptoms and signs involving cognitive functions following cerebral infarction: Secondary | ICD-10-CM | POA: Diagnosis not present

## 2019-12-20 DIAGNOSIS — Z7902 Long term (current) use of antithrombotics/antiplatelets: Secondary | ICD-10-CM | POA: Diagnosis not present

## 2019-12-20 DIAGNOSIS — K649 Unspecified hemorrhoids: Secondary | ICD-10-CM | POA: Diagnosis not present

## 2019-12-20 DIAGNOSIS — K635 Polyp of colon: Secondary | ICD-10-CM | POA: Diagnosis not present

## 2019-12-20 DIAGNOSIS — K5730 Diverticulosis of large intestine without perforation or abscess without bleeding: Secondary | ICD-10-CM | POA: Diagnosis not present

## 2019-12-20 DIAGNOSIS — K2950 Unspecified chronic gastritis without bleeding: Secondary | ICD-10-CM | POA: Insufficient documentation

## 2019-12-20 DIAGNOSIS — E611 Iron deficiency: Secondary | ICD-10-CM | POA: Insufficient documentation

## 2019-12-20 DIAGNOSIS — Z87891 Personal history of nicotine dependence: Secondary | ICD-10-CM | POA: Diagnosis not present

## 2019-12-20 DIAGNOSIS — Z8719 Personal history of other diseases of the digestive system: Secondary | ICD-10-CM

## 2019-12-20 DIAGNOSIS — Z89512 Acquired absence of left leg below knee: Secondary | ICD-10-CM | POA: Diagnosis not present

## 2019-12-20 DIAGNOSIS — D128 Benign neoplasm of rectum: Secondary | ICD-10-CM | POA: Diagnosis not present

## 2019-12-20 DIAGNOSIS — K621 Rectal polyp: Secondary | ICD-10-CM

## 2019-12-20 DIAGNOSIS — Z862 Personal history of diseases of the blood and blood-forming organs and certain disorders involving the immune mechanism: Secondary | ICD-10-CM

## 2019-12-20 DIAGNOSIS — I69354 Hemiplegia and hemiparesis following cerebral infarction affecting left non-dominant side: Secondary | ICD-10-CM | POA: Insufficient documentation

## 2019-12-20 HISTORY — PX: ESOPHAGOGASTRODUODENOSCOPY (EGD) WITH PROPOFOL: SHX5813

## 2019-12-20 HISTORY — PX: ENDOSCOPIC MUCOSAL RESECTION: SHX6839

## 2019-12-20 HISTORY — PX: BIOPSY: SHX5522

## 2019-12-20 HISTORY — PX: COLONOSCOPY WITH PROPOFOL: SHX5780

## 2019-12-20 HISTORY — PX: SUBMUCOSAL LIFTING INJECTION: SHX6855

## 2019-12-20 HISTORY — PX: POLYPECTOMY: SHX5525

## 2019-12-20 HISTORY — PX: HEMOSTASIS CLIP PLACEMENT: SHX6857

## 2019-12-20 LAB — POCT I-STAT, CHEM 8
BUN: 17 mg/dL (ref 8–23)
Calcium, Ion: 1.01 mmol/L — ABNORMAL LOW (ref 1.15–1.40)
Chloride: 109 mmol/L (ref 98–111)
Creatinine, Ser: 0.9 mg/dL (ref 0.61–1.24)
Glucose, Bld: 141 mg/dL — ABNORMAL HIGH (ref 70–99)
HCT: 47 % (ref 39.0–52.0)
Hemoglobin: 16 g/dL (ref 13.0–17.0)
Potassium: 5 mmol/L (ref 3.5–5.1)
Sodium: 139 mmol/L (ref 135–145)
TCO2: 24 mmol/L (ref 22–32)

## 2019-12-20 LAB — GLUCOSE, CAPILLARY: Glucose-Capillary: 139 mg/dL — ABNORMAL HIGH (ref 70–99)

## 2019-12-20 SURGERY — COLONOSCOPY WITH PROPOFOL
Anesthesia: Monitor Anesthesia Care

## 2019-12-20 MED ORDER — EPHEDRINE SULFATE-NACL 50-0.9 MG/10ML-% IV SOSY
PREFILLED_SYRINGE | INTRAVENOUS | Status: DC | PRN
  Administered 2019-12-20: 5 mg via INTRAVENOUS
  Administered 2019-12-20 (×2): 10 mg via INTRAVENOUS

## 2019-12-20 MED ORDER — FENTANYL CITRATE (PF) 100 MCG/2ML IJ SOLN: 25.0000 ug | INTRAMUSCULAR | Status: DC | PRN

## 2019-12-20 MED ORDER — OXYCODONE HCL 5 MG PO TABS: 5.0000 mg | ORAL_TABLET | Freq: Once | ORAL | Status: DC | PRN

## 2019-12-20 MED ORDER — PROPOFOL 500 MG/50ML IV EMUL
INTRAVENOUS | Status: DC | PRN
  Administered 2019-12-20: 100 ug/kg/min via INTRAVENOUS

## 2019-12-20 MED ORDER — ONDANSETRON HCL 4 MG/2ML IJ SOLN: 4.0000 mg | Freq: Once | INTRAMUSCULAR | Status: DC | PRN

## 2019-12-20 MED ORDER — OXYCODONE HCL 5 MG/5ML PO SOLN: 5.0000 mg | Freq: Once | ORAL | Status: DC | PRN

## 2019-12-20 MED ORDER — PROPOFOL 10 MG/ML IV BOLUS
INTRAVENOUS | Status: DC | PRN
  Administered 2019-12-20: 10 mg via INTRAVENOUS
  Administered 2019-12-20: 20 mg via INTRAVENOUS
  Administered 2019-12-20: 30 mg via INTRAVENOUS
  Administered 2019-12-20: 20 mg via INTRAVENOUS
  Administered 2019-12-20: 40 mg via INTRAVENOUS

## 2019-12-20 MED ORDER — SODIUM CHLORIDE 0.9 % IV SOLN: INTRAVENOUS | Status: DC

## 2019-12-20 MED ORDER — LIDOCAINE 2% (20 MG/ML) 5 ML SYRINGE
INTRAMUSCULAR | Status: DC | PRN
  Administered 2019-12-20: 100 mg via INTRAVENOUS

## 2019-12-20 MED ORDER — PHENYLEPHRINE 40 MCG/ML (10ML) SYRINGE FOR IV PUSH (FOR BLOOD PRESSURE SUPPORT)
PREFILLED_SYRINGE | INTRAVENOUS | Status: DC | PRN
  Administered 2019-12-20 (×3): 80 ug via INTRAVENOUS
  Administered 2019-12-20: 120 ug via INTRAVENOUS
  Administered 2019-12-20: 160 ug via INTRAVENOUS
  Administered 2019-12-20 (×3): 80 ug via INTRAVENOUS

## 2019-12-20 MED ORDER — LACTATED RINGERS IV SOLN: INTRAVENOUS | Status: DC

## 2019-12-20 MED ORDER — OMEPRAZOLE 40 MG PO CPDR: 40.0000 mg | DELAYED_RELEASE_CAPSULE | Freq: Every day | ORAL | 5 refills | Status: DC

## 2019-12-20 SURGICAL SUPPLY — 25 items

## 2019-12-20 NOTE — H&P (Signed)
GASTROENTEROLOGY PROCEDURE H&P NOTE   Primary Care Physician: Patient, No Pcp Per  HPI: Billy Torres is a 64 y.o. male who presents for Colonoscopy for attempt at EMR of cecal polyp and EGD for evaluation of Iron deficiency.  Past Medical History:  Diagnosis Date  . Abnormal rate of speech    post stroke  . Anticoagulant long-term use    plavix  . Arthritis of left shoulder region   . Chronic constipation   . Chronic kidney disease    chronic kidney disease   . Cognitive deficit due to old cerebral infarction   . DDD (degenerative disc disease), cervical   . Degenerative cervical spinal stenosis   . Dysphagia   . Gait disturbance, post-stroke    uses cane  . History of ischemic left MCA stroke 06/27/2013   w/ evidence hemorrhagia, high grade stenosis bilateral proximal MCA and extensive small matter disease--- symptoms resolved, no residual   . History of ischemic stroke without residual deficits 05-14-2015---  residual left hemiparesis and cognitive deficits   small nonhemorrhegic infarct right corona radiata posterior limb and progressive right m2 stenosis  . Hyperlipidemia   . Hypertension   . Insomnia   . Internal hemorrhoids   . Left hemiparesis (HCC)    RESIDUAL FROM STROKE--  WALKS W/ CANE  . Lower GI bleed   . Mild atherosclerosis of carotid artery, right    RICA 0-39% per duplex 06-28-2013  . OA (osteoarthritis)    LEFT SHOULDER  . Painful bladder spasm   . Peripheral arterial disease ([place])   . Prostate cancer [facility]) urologist-  dr Gaynelle Arabian  oncologist-  dr [name]   dx 12/ 2017-- Stage T1c, Gleason 3+4,  PSA 10.4,  vol 51cc  . Sensorineural hearing loss, bilateral   . Type 2 diabetes mellitus treated with insulin ([place])   . Urge incontinence    w frequency, wears depends  . Urinary retention    Previously required indwelling Foley  . Wears dentures    Past Surgical History:  Procedure Laterality Date  . ABDOMINAL AORTOGRAM W/LOWER EXTREMITY N/A  12/11/2018   Procedure: ABDOMINAL AORTOGRAM W/LOWER EXTREMITY;  Surgeon: Angelia Mould, MD;  Location: [place] [facility];  Service: Cardiovascular;  Laterality: N/A;  . ABDOMINAL AORTOGRAM W/LOWER EXTREMITY Bilateral 04/02/2019   Procedure: ABDOMINAL AORTOGRAM W/LOWER EXTREMITY;  Surgeon: Angelia Mould, MD;  Location: [place] [facility];  Service: Cardiovascular;  Laterality: Bilateral;  . AMPUTATION Left 04/28/2019   Procedure: LEFT BELOW KNEE AMPUTATION;  Surgeon: Newt Minion, MD;  Location: [place];  Service: Orthopedics;  Laterality: Left;  . ANTERIOR CERVICAL DECOMP/DISCECTOMY FUSION  11/27/1999   C5 -- C6  . BELOW KNEE LEG AMPUTATION Left 04/28/2019  . BIOPSY  05/21/2019   Procedure: BIOPSY;  Surgeon: Doran Stabler, MD;  Location: [facility] ENDOSCOPY;  Service: Gastroenterology;;  . Denton Meek  10/07/2001  . COLONOSCOPY WITH PROPOFOL N/A 05/21/2019   Procedure: COLONOSCOPY WITH PROPOFOL;  Surgeon: Doran Stabler, MD;  Location: [place];  Service: Gastroenterology;  Laterality: N/A;  . CYSTOSCOPY WITH INSERTION OF UROLIFT N/A 08/11/2017   Procedure: CYSTOSCOPY WITH INSERTION OF UROLIFT;  Surgeon: Cleon Gustin, MD;  Location: [facility];  Service: Urology;  Laterality: N/A;  . FLEXIBLE SIGMOIDOSCOPY N/A 05/23/2019   Procedure: FLEXIBLE SIGMOIDOSCOPY;  Surgeon: Doran Stabler, MD;  Location: [place];  Service: Gastroenterology;  Laterality: N/A;  . FLEXIBLE SIGMOIDOSCOPY N/A 05/26/2019   Procedure: FLEXIBLE SIGMOIDOSCOPY;  Surgeon: Milus Banister, MD;  Location: [facility] ENDOSCOPY;  Service: Endoscopy;  Laterality: N/A;  . FOOT SURGERY  2005  . HEMOSTASIS CLIP PLACEMENT  05/23/2019   Procedure: HEMOSTASIS CLIP PLACEMENT;  Surgeon: Doran Stabler, MD;  Location: [facility]COPY;  Service: Gastroenterology;;  . HEMOSTASIS CLIP PLACEMENT  05/26/2019   Procedure: HEMOSTASIS CLIP PLACEMENT;  Surgeon: Milus Banister, MD;  Location: [place];  Service: Endoscopy;;   . PERIPHERAL VASCULAR BALLOON ANGIOPLASTY  12/11/2018   Procedure: PERIPHERAL VASCULAR BALLOON ANGIOPLASTY;  Surgeon: Angelia Mould, MD;  Location: [place] [facility];  Service: Cardiovascular;;  Lt. SFA  . PROSTATE BIOPSY  12/12/2015  . RADIOACTIVE SEED IMPLANT N/A 05/30/2016   Procedure: RADIOACTIVE SEED IMPLANT/BRACHYTHERAPY IMPLANT WITH SPACE OAR;  Surgeon: Carolan Clines, MD;  Location[facility];  Service: Urology;  Laterality: N/A;  . SHOULDER ARTHROSCOPY WITH SUBACROMIAL DECOMPRESSION Left 11/28/2006   Debridement/ Acromioplasty/  CA ligament relesase  . TRANSTHORACIC ECHOCARDIOGRAM  05/15/2015   mild LVH,  ef 65-70%, codominant mitral inflow-iindeterminate diastolic function/     Current Facility-Administered Medications  Medication Dose Route Frequency Provider Last Rate Last Admin  . 0.9 %  sodium chloride infusion   Intravenous Continuous Mansouraty, Telford Nab., MD      . lactated ringers infusion   Intravenous Continuous Mansouraty, Telford Nab., MD 10 mL/hr at 12/20/19 0740 New Bag at 12/20/19 0740   No Known Allergies Family History  Problem Relation Age of Onset  . Hypertension Mother   . Hypertension Father   . Cancer Brother        prostate   Social History   Socioeconomic History  . Marital status: Divorced    Spouse name: Not on file  . Number of children: Not on file  . Years of education: Not on file  . Highest education level: Not on file  Occupational History  . Not on file  Tobacco Use  . Smoking status: Former Smoker    Packs/day: 0.25    Years: 45.00    Pack years: 11.25    Types: Cigarettes  . Smokeless tobacco: Never Used  Vaping Use  . Vaping Use: Never used  Substance and Sexual Activity  . Alcohol use: No  . Drug use: No  . Sexual activity: Yes  Other Topics Concern  . Not on file  Social History Narrative  . Not on file   Social Determinants of Health   Financial Resource Strain: Not on file  Food  Insecurity: Not on file  Transportation Needs: Not on file  Physical Activity: Not on file  Stress: Not on file  Social Connections: Not on file  Intimate Partner Violence: Not on file    Physical Exam: Vital signs in last 24 hours: Temp:  [98.3 F (36.8 C)] 98.3 F (36.8 C) (12/13 0718) Pulse Rate:  [68] 68 (12/13 0718) Resp:  [16] 16 (12/13 0718) BP: (165)/(89) 165/89 (12/13 0718) SpO2:  [99 %] 99 % (12/13 0718) Weight:  [72.6 kg] 72.6 kg (12/13 0718)   GEN: NAD EYE: Sclerae anicteric ENT: MMM CV: Non-tachycardic GI: Soft, NT/ND NEURO:  Alert & Oriented x 3  Lab Results: Recent Labs    12/20/19 0730  HGB 16.0  HCT 47.0   BMET Recent Labs    12/20/19 0730  NA 139  K 5.0  CL 109  GLUCOSE 141*  BUN 17  CREATININE 0.90   LFT No results for input(s): PROT, ALBUMIN, AST, ALT, ALKPHOS, BILITOT,  BILIDIR, IBILI in the last 72 hours. PT/INR No results for input(s): LABPROT, INR in the last 72 hours.   Impression / Plan: This is a 64 y.o.male who presents for Colonoscopy for attempt at EMR of cecal polyp and EGD for evaluation of Iron deficiency.   The risks and benefits of endoscopic evaluation were discussed with the patient; these include but are not limited to the risk of perforation, infection, bleeding, missed lesions, lack of diagnosis, severe illness requiring hospitalization, as well as anesthesia and sedation related illnesses.  The patient is agreeable to proceed.    Justice Britain, MD [facility] Office # [phone]

## 2019-12-20 NOTE — Anesthesia Postprocedure Evaluation (Signed)
Anesthesia Post Note  Patient: Billy Torres  Procedure(s) Performed: COLONOSCOPY WITH PROPOFOL (N/A ) ENDOSCOPIC MUCOSAL RESECTION (N/A ) ESOPHAGOGASTRODUODENOSCOPY (EGD) WITH PROPOFOL (N/A ) BIOPSY SUBMUCOSAL LIFTING INJECTION POLYPECTOMY HEMOSTASIS CLIP PLACEMENT     Patient location during evaluation: PACU Anesthesia Type: MAC Level of consciousness: awake and alert Pain management: pain level controlled Vital Signs Assessment: post-procedure vital signs reviewed and stable Respiratory status: spontaneous breathing, nonlabored ventilation, respiratory function stable and patient connected to nasal cannula oxygen Cardiovascular status: blood pressure returned to baseline and stable Postop Assessment: no apparent nausea or vomiting Anesthetic complications: no   No complications documented.  Last Vitals:  Vitals:   12/20/19 1035 12/20/19 1050  BP: (!) 148/71 (!) 156/82  Pulse: 66 66  Resp: 17 16  Temp:    SpO2: 99% 100%    Last Pain:  Vitals:   12/20/19 1105  TempSrc:   PainSc: 0-No pain                 [name]

## 2019-12-20 NOTE — Op Note (Signed)
[facility] Patient Name: Billy Torres Procedure Date : 12/20/2019 MRN: [id number] Attending MD: Justice Britain , MD Date of Birth: 1955/11/15 CSN: [id number] Age: 64 Admit Type: Outpatient Procedure:                Upper GI endoscopy Indications:              Iron deficiency Providers:                Justice Britain, MD, Jeanella Cara, RN,                            Josie Dixon, RN, Cletis [place], Technician Referring MD:             Estill Cotta. Loletha Carrow, MD, [facility] Medicines:                Monitored Anesthesia Care Complications:            No immediate complications. Estimated Blood Loss:     Estimated blood loss was minimal. Procedure:                Pre-Anesthesia Assessment:                           - Prior to the procedure, a History and Physical                            was performed, and patient medications and                            allergies were reviewed. The patient's tolerance of                            previous anesthesia was also reviewed. The risks                            and benefits of the procedure and the sedation                            options and risks were discussed with the patient.                            All questions were answered, and informed consent                            was obtained. Prior Anticoagulants: The patient has                            taken no previous anticoagulant or antiplatelet                            agents. ASA Grade Assessment: III - A patient with                            severe systemic disease. After reviewing the risks  and benefits, the patient was deemed in                            satisfactory condition to undergo the procedure.                           After obtaining informed consent, the endoscope was                            passed under direct vision. Throughout the                            procedure, the patient's blood pressure,  pulse, and                            oxygen saturations were monitored continuously. The                            [id number]-H190 ([id number]) Olympus gastroscope was                            introduced through the mouth, and advanced to the                            second part of duodenum. The upper GI endoscopy was                            accomplished without difficulty. The patient                            tolerated the procedure. Scope In: Scope Out: Findings:      No gross lesions were noted in the entire esophagus.      The Z-line was irregular and was found 40 cm from the incisors.      Diffuse moderate inflammation characterized by erythema, friability and       granularity was found in the entire examined stomach.      No other gross lesions were noted in the entire examined stomach.       Biopsies were taken with a cold forceps for histology and Helicobacter       pylori testing.      No gross lesions were noted in the duodenal bulb, in the first portion       of the duodenum and in the second portion of the duodenum. Biopsies for       histology were taken with a cold forceps for evaluation of celiac       disease. Impression:               - No gross lesions in esophagus. Z-line irregular,                            40 cm from the incisors.                           - Gastritis. No other gross lesions in the stomach.  Biopsied.                           - No gross lesions in the duodenal bulb, in the                            first portion of the duodenum and in the second                            portion of the duodenum. Biopsied. Recommendation:           - Proceed to scheduled colonoscopy.                           - Start Omeprazole 40 mg daily.                           - Await pathology results.                           - The findings and recommendations were discussed                            with the patient. Procedure Code(s):         --- Professional ---                           [phone], Esophagogastroduodenoscopy, flexible,                            transoral; with biopsy, single or multiple Diagnosis Code(s):        --- Professional ---                           K22.8, Other specified diseases of esophagus                           K29.70, Gastritis, unspecified, without bleeding                           D50.9, Iron deficiency anemia, unspecified CPT copyright 2019 American Medical Association. All rights reserved. The codes documented in this report are preliminary and upon coder review may  be revised to meet current compliance requirements. Justice Britain, MD 12/20/2019 10:22:23 AM Number of Addenda: 0

## 2019-12-20 NOTE — Op Note (Addendum)
[facility] Patient Name: Billy Torres Procedure Date : 12/20/2019 MRN: [id number] Attending MD: Justice Britain , MD Date of Birth: 12/11/1955 CSN: [id number] Age: 64 Admit Type: Outpatient Procedure:                Colonoscopy Indications:              Excision of colonic polyp Providers:                Justice Britain, MD, Josie Dixon, RN,                            Jeanella Cara, RN, Cletis [place], Technician Referring MD:             Estill Cotta. Loletha Carrow, MD, [facility] Medicines:                Monitored Anesthesia Care Complications:            No immediate complications. Estimated Blood Loss:     Estimated blood loss was minimal. Procedure:                Pre-Anesthesia Assessment:                           - Prior to the procedure, a History and Physical                            was performed, and patient medications and                            allergies were reviewed. The patient's tolerance of                            previous anesthesia was also reviewed. The risks                            and benefits of the procedure and the sedation                            options and risks were discussed with the patient.                            All questions were answered, and informed consent                            was obtained. Prior Anticoagulants: The patient has                            taken no previous anticoagulant or antiplatelet                            agents. ASA Grade Assessment: III - A patient with                            severe systemic disease. After reviewing the risks  and benefits, the patient was deemed in                            satisfactory condition to undergo the procedure.                           After obtaining informed consent, the colonoscope                            was passed under direct vision. Throughout the                            procedure, the patient's blood  pressure, pulse, and                            oxygen saturations were monitored continuously. The                            PCF-H190DL ([id number]) Olympus pediatric colonoscope                            was introduced through the anus and advanced to the                            8 cm into the ileum. The colonoscopy was                            technically difficult and complex due to poor                            endoscopic visualization. Successful completion of                            the procedure was aided by performing the maneuvers                            documented (below) in this report. The patient                            tolerated the procedure. The quality of the bowel                            preparation was adequate. The terminal ileum,                            ileocecal valve, appendiceal orifice, and rectum                            were photographed. Scope In: 8:21:27 AM Scope Out: 10:03:15 AM Scope Withdrawal Time: 1 hour 39 minutes 40 seconds  Total Procedure Duration: 1 hour 41 minutes 48 seconds  Findings:      The digital rectal exam findings include hemorrhoids. Pertinent       negatives include no palpable rectal lesions.  A large amount of semi-liquid stool was found in the entire colon,       interfering with visualization. Lavage of the area was performed using       copious amounts, resulting in clearance with barely adequate       visualization.      The terminal ileum and ileocecal valve appeared normal.      A 45 mm polyp was found in the cecum. The polyp was sessile and mixed       lateral spreading and invovled over 40% of the wall of the cecum and was       moving towards and encroaching towards the ileocecal valve but did not       enter it. Preparations were made for mucosal resection. NBI imaging and       white-light endoscopy was done to demarcate the borders of the lesion.       Orise gel was injected to raise the lesion  (>20 mL). Piecemeal mucosal       resection using a cold snare was performed. Resection and retrieval were       complete. To prevent bleeding after mucosal resection and in 1 area       where there was some mild oozing, four hemostatic clips were       successfully placed (MR conditional). The entire resection bed was >50%       of the cecum and could not be closed endoscopically. There was no       bleeding at the end of the procedure.      Five sessile and semi-sessile polyps were found in the descending colon       (1), transverse colon (2), ascending colon (1) and cecum (1). The polyps       were 3 to 9 mm in size. These polyps were removed with a cold snare.       Resection and retrieval were complete.      A 15 mm polyp was found in the distal rectum. The polyp was sessile.       Preparations were made for mucosal resection. NBI imaging and       White-light endoscopy was done to demarcate the borders of the lesion.       Orise gel was injected to raise the lesion. Snare mucosal resection was       performed. Resection and retrieval were complete. To prevent bleeding       after mucosal resection, two hemostatic clips were successfully placed       (MR conditional). There was no bleeding at the end of the procedure.      A few small-mouthed diverticula were found in the recto-sigmoid colon       and sigmoid colon. Impression:               - Hemorrhoids found on digital rectal exam.                           - Stool in the entire examined colon. Lavaged with                            adequate visualization.                           - The examined portion of the ileum was normal.                           -  One 45 mm polyp in the cecum encroaching towards                            the ICV but not entering, removed with piecemeal                            cold mucosal resection. Resected and retrieved.                            Clips (MR conditional) were placed.                            - Five 3 to 9 mm polyps in the descending colon, in                            the transverse colon, in the ascending colon and in                            the cecum, removed with a cold snare. Resected and                            retrieved.                           - One 15 mm polyp in the distal rectum, removed                            with mucosal resection. Resected and retrieved.                            Clips (MR conditional) were placed.                           - Diverticulosis in the recto-sigmoid colon and in                            the sigmoid colon. Recommendation:           - The patient will be observed post-procedure,                            until all discharge criteria are met.                           - Discharge patient to home.                           - Patient has a contact number available for                            emergencies. The signs and symptoms of potential                            delayed complications were discussed with the  patient. Return to normal activities tomorrow.                            Written discharge instructions were provided to the                            patient.                           - High fiber diet.                           - No aspirin, ibuprofen, naproxen, or other                            non-steroidal anti-inflammatory drugs for 2 weeks                            after polyp removal.                           - Continue present medications.                           - Await pathology results.                           - Repeat colonoscopy in 6-9 months for surveillance.                           - Expect some mild spotting for next 24 hours, but                            that should subside. Monitor for signs/symptoms of                            bleeding/perforation/infection. Please call if                            issues arise.                            - The findings and recommendations were discussed                            with the patient. Procedure Code(s):        --- Professional ---                           [id number], Colonoscopy, flexible; with endoscopic                            mucosal resection                           [id number], 79, Colonoscopy, flexible; with removal of  tumor(s), polyp(s), or other lesion(s) by snare                            technique Diagnosis Code(s):        --- Professional ---                           K64.9, Unspecified hemorrhoids                           K63.5, Polyp of colon                           K62.1, Rectal polyp                           K57.30, Diverticulosis of large intestine without                            perforation or abscess without bleeding CPT copyright 2019 American Medical Association. All rights reserved. The codes documented in this report are preliminary and upon coder review may  be revised to meet current compliance requirements. Justice Britain, MD 12/20/2019 10:33:32 AM Number of Addenda: 0

## 2019-12-20 NOTE — Transfer of Care (Signed)
Immediate Anesthesia Transfer of Care Note  Patient: Billy Torres  Procedure(s) Performed: COLONOSCOPY WITH PROPOFOL (N/A ) ENDOSCOPIC MUCOSAL RESECTION (N/A ) ESOPHAGOGASTRODUODENOSCOPY (EGD) WITH PROPOFOL (N/A ) BIOPSY SUBMUCOSAL LIFTING INJECTION POLYPECTOMY HEMOSTASIS CLIP PLACEMENT  Patient Location: PACU  Anesthesia Type:MAC  Level of Consciousness: awake, alert  and patient cooperative  Airway & Oxygen Therapy: Patient Spontanous Breathing  Post-op Assessment: Report given to RN and Post -op Vital signs reviewed and stable  Post vital signs: Reviewed and stable  Last Vitals:  Vitals Value Taken Time  BP 142/83 12/20/19 1019  Temp 36.4 C 12/20/19 1020  Pulse 72 12/20/19 1020  Resp 18 12/20/19 1020  SpO2 96 % 12/20/19 1020  Vitals shown include unvalidated device data.  Last Pain:  Vitals:   12/20/19 0718  TempSrc: Oral  PainSc: 0-No pain         Complications: No complications documented.

## 2019-12-20 NOTE — Anesthesia Preprocedure Evaluation (Signed)
Anesthesia Evaluation  Patient identified by MRN, date of birth, ID band Patient awake    Reviewed: Allergy & Precautions, NPO status , Patient's Chart, lab work & pertinent test results  Airway Mallampati: II  TM Distance: >3 FB Neck ROM: Full    Dental  (+) Dental Advisory Given, Edentulous Upper   Pulmonary former smoker,    breath sounds clear to auscultation       Cardiovascular hypertension,  Rhythm:Regular     Neuro/Psych    GI/Hepatic   Endo/Other  diabetes  Renal/GU      Musculoskeletal   Abdominal   Peds  Hematology   Anesthesia Other Findings   Reproductive/Obstetrics                             Anesthesia Physical Anesthesia Plan  ASA: III  Anesthesia Plan: MAC   Post-op Pain Management:    Induction: Intravenous  PONV Risk Score and Plan: Ondansetron  Airway Management Planned: Natural Airway and Simple Face Mask  Additional Equipment:   Intra-op Plan:   Post-operative Plan:   Informed Consent: I have reviewed the patients History and Physical, chart, labs and discussed the procedure including the risks, benefits and alternatives for the proposed anesthesia with the patient or authorized representative who has indicated his/her understanding and acceptance.       Plan Discussed with: Anesthesiologist and CRNA  Anesthesia Plan Comments:         Anesthesia Quick Evaluation

## 2019-12-21 ENCOUNTER — Encounter (HOSPITAL_COMMUNITY): Payer: Self-pay | Admitting: Gastroenterology

## 2019-12-21 ENCOUNTER — Other Ambulatory Visit: Payer: Self-pay | Admitting: Physician Assistant

## 2019-12-21 LAB — SURGICAL PATHOLOGY

## 2019-12-22 ENCOUNTER — Other Ambulatory Visit: Payer: Self-pay

## 2019-12-22 ENCOUNTER — Encounter (HOSPITAL_COMMUNITY): Payer: Self-pay | Admitting: Gastroenterology

## 2019-12-22 ENCOUNTER — Encounter: Payer: Self-pay | Admitting: Gastroenterology

## 2019-12-22 MED ORDER — CLARITHROMYCIN 500 MG PO TABS: 500.0000 mg | ORAL_TABLET | Freq: Two times a day (BID) | ORAL | 0 refills | Status: AC

## 2019-12-22 MED ORDER — AMOXICILLIN 500 MG PO TABS: 1000.0000 mg | ORAL_TABLET | Freq: Two times a day (BID) | ORAL | 0 refills | Status: AC

## 2019-12-22 MED ORDER — METRONIDAZOLE 500 MG PO TABS: 500.0000 mg | ORAL_TABLET | Freq: Two times a day (BID) | ORAL | 0 refills | Status: AC

## 2019-12-22 NOTE — Progress Notes (Signed)
I have routed the note the [facility]

## 2020-02-08 ENCOUNTER — Ambulatory Visit (INDEPENDENT_AMBULATORY_CARE_PROVIDER_SITE_OTHER): Payer: Medicare (Managed Care) | Admitting: Gastroenterology

## 2020-02-08 ENCOUNTER — Encounter: Payer: Self-pay | Admitting: Gastroenterology

## 2020-02-08 ENCOUNTER — Other Ambulatory Visit: Payer: Medicare (Managed Care)

## 2020-02-08 VITALS — BP 112/72 | HR 67 | Ht 68.0 in | Wt 162.8 lb

## 2020-02-08 DIAGNOSIS — A048 Other specified bacterial intestinal infections: Secondary | ICD-10-CM

## 2020-02-08 NOTE — Progress Notes (Signed)
[facility] Progress Note  Chief Complaint: H. pylori  Subjective  History: I last saw this patient during hospitalization for acute GI bleeding in May of last year.  It was due to a stercoral ulcer in the rectum I required a few procedures for localization and control, ultimately achieved sigmoidoscopy by Dr. Ardis Hughs on 05/26/2019.  During the colonoscopy with me he was found to have a large proximal colon carpet-like polyp as well as additional smaller polyps throughout the colon, and he ultimately underwent colonoscopy by Dr. Rush Landmark on 12/20/2019 for EMR.  He also had EGD that day for iron deficiency.  There was mild patchy antral erythema, biopsies positive for H. Pylori. Dr. Rush Landmark treated him with Pylera (or components), and arrange follow-up with me to plan testing to confirm H. pylori eradication.  A surveillance colonoscopy is planned at a 6 to 58-month interval.  Patient with poor health literacy and memory difficulty.  Our staff spoke to [facility] staff, after which it was unclear if patient completed the Abx course as ordered.  Denyse Amass is unable to provide much history or review of systems due to limited health literacy.  He denies abdominal pain, vomiting, dysphagia, loss of appetite, abdominal pain or rectal bleeding.  ROS: Cardiovascular:  no chest pain Respiratory: no dyspnea Chronic left hemiparesis after CVA The patient's Past Medical, Family and Social History were reviewed and are on file in the EMR.  Objective:  Med list reviewed  Current Outpatient Medications:  .  acetaminophen (TYLENOL) 325 MG tablet, Take 1-2 tablets (325-650 mg total) by mouth every 6 (six) hours as needed for mild pain (pain score 1-3 or temp > 100.5)., Disp:  , Rfl:  .  alfuzosin (UROXATRAL) 10 MG 24 hr tablet, Take 10 mg by mouth daily with breakfast., Disp: , Rfl:  .  atorvastatin (LIPITOR) 20 MG tablet, Take 20 mg by mouth daily. , Disp: , Rfl:  .  donepezil (ARICEPT) 10 MG tablet,  Take 10 mg by mouth at bedtime., Disp: , Rfl:  .  ferrous gluconate (FERGON) 324 MG tablet, Take 1 tablet (324 mg total) by mouth daily with breakfast., Disp: 30 tablet, Rfl: 3 .  fexofenadine (ALLEGRA) 60 MG tablet, Take 60 mg by mouth 2 (two) times daily as needed for allergies or rhinitis., Disp: , Rfl:  .  gabapentin (NEURONTIN) 400 MG capsule, Take 2 capsules (800 mg total) by mouth at bedtime., Disp:  , Rfl:  .  metFORMIN (GLUCOPHAGE) 500 MG tablet, Take 500 mg by mouth 2 (two) times daily with a meal. , Disp: , Rfl:  .  Nutritional Supplements (ENSURE ENLIVE PO), Take 8 oz by mouth daily., Disp: , Rfl:  .  omeprazole (PRILOSEC) 40 MG capsule, Take 1 capsule (40 mg total) by mouth daily., Disp: 30 capsule, Rfl: 5 .  oxyCODONE (OXY IR/ROXICODONE) 5 MG immediate release tablet, Take 1 tablet (5 mg total) by mouth every 4 (four) hours as needed for severe pain., Disp: 30 tablet, Rfl: 0 .  sildenafil (VIAGRA) 25 MG tablet, Take 25 mg by mouth daily as needed for erectile dysfunction., Disp: , Rfl:  .  traZODone (DESYREL) 100 MG tablet, Take 100 mg by mouth at bedtime., Disp: , Rfl:    Vital signs in last 24 hrs: Vitals:   02/08/20 1424  BP: 112/72  Pulse: 67  SpO2: 97%   Wt Readings from Last 3 Encounters:  02/08/20 162 lb 12.8 oz (73.8 kg)  12/20/19 160 lb (72.6  kg)  05/21/19 131 lb 2.8 oz (59.5 kg)    Physical Exam  Flattened affect.  Sitting in wheelchair, not acutely ill-appearing.  HEENT: sclera anicteric, oral mucosa moist without lesions  Neck: supple, no thyromegaly, JVD or lymphadenopathy  Cardiac: RRR without murmurs, S1S2 heard, no peripheral edema  Pulm: clear to auscultation bilaterally, normal RR and effort noted  Abdomen: soft, no tenderness, with active bowel sounds. No guarding or palpable hepatosplenomegaly.  (Limited exam in wheelchair)  Skin; warm and dry, no jaundice or rash Left arm and leg weakness.  Left lower leg  prosthesis Labs:   ___________________________________________ Radiologic studies:   ____________________________________________ Other:  Pathology from 12/20/2019 procedures:  A. DUODENUM, BIOPSY:  - Mild increase in intraepithelial lymphocytes, see comment.  - No dysplasia or malignancy.   B. STOMACH, BIOPSY:  - Chronic active gastritis with Helicobacter pylori.  - Warthin-Starry is positive for Helicobacter pylori.  - No intestinal metaplasia, dysplasia, or malignancy.   C. COLON POLYP, CECAL, EMR:  - Multiple fragments of tubulovillous adenoma with high grade dysplasia.    D. COLON, MULTIPLE, POLYPECTOMY:  - Multiple fragments of tubular adenoma and hyperplastic polyp.  - No high grade dysplasia or malignancy.   E. RECTAL POLYP, EMR:  - Multiple fragments of tubulovillous adenoma.  - No high grade dysplasia or malignancy _____________________________________________ Assessment & Plan  Assessment: Encounter Diagnosis  Name Primary?  . H. pylori infection Yes   Incidental H. pylori infection, recently treated but uncertain if antibiotic regimen completed as directed.  He was prescribed 30 days of omeprazole and 5 refills at the time of H. pylori diagnosis, but he does not need this chronically.  Large tubulovillous adenoma with high-grade dysplasia and other adenomatous polyps, he will be recalled for colonoscopy with Dr. Rush Landmark in 6 to 9 months. Plan: Discontinue omeprazole  In 2 to 3 weeks, H. pylori stool antigen to confirm eradication.  If still positive, treat with amoxicillin and clarithromycin.   Nelida Meuse III   CC: Barney Drain, MD ([facility])

## 2020-02-08 NOTE — Patient Instructions (Signed)
If you are age 66 or older, your body mass index should be between 23-30. Your Body mass index is 24.75 kg/m. If this is out of the aforementioned range listed, please consider follow up with your Primary Care Provider.  If you are age 35 or younger, your body mass index should be between 19-25. Your Body mass index is 24.75 kg/m. If this is out of the aformentioned range listed, please consider follow up with your Primary Care Provider.   Your provider has requested that you go to the basement level for lab work before leaving today. Press "B" on the elevator. The lab is located at the first door on the left as you exit the elevator.  Due to recent changes in healthcare laws, you may see the results of your imaging and laboratory studies on MyChart before your provider has had a chance to review them.  We understand that in some cases there may be results that are confusing or concerning to you. Not all laboratory results come back in the same time frame and the provider may be waiting for multiple results in order to interpret others.  Please give [place] 48 hours in order for your provider to thoroughly review all the results before contacting the office for clarification of your results.   Stop the omeprazole.  Please complete the H.Pylori stool test in 2-3 weeks  It was a pleasure to see you today!  Dr. Loletha Carrow

## 2020-03-07 ENCOUNTER — Telehealth: Payer: Self-pay

## 2020-03-07 NOTE — Telephone Encounter (Signed)
-----   Message from [place] sent at 02/08/2020  3:09 PM EST ----- Check stool study for h.pylori, ordered on 02-08-2020

## 2020-03-07 NOTE — Telephone Encounter (Signed)
Called and LM for pt  reminding him to go to the lab for H pylori stool test.

## 2020-03-10 NOTE — Telephone Encounter (Signed)
Called and LM for patient to go to lab for H Pylori stool test. To call with questions

## 2020-06-20 ENCOUNTER — Telehealth: Payer: Self-pay | Admitting: Gastroenterology

## 2020-06-20 NOTE — Telephone Encounter (Signed)
Correct. I have copied Dr. Rush Landmark on this.

## 2020-06-20 NOTE — Telephone Encounter (Signed)
Inbound call from [facility]. Patient is due colonoscopy at hospital after August. Billy Torres called in to get that scheduled. Best contact number [phone]

## 2020-06-20 NOTE — Telephone Encounter (Signed)
Dr. Loletha Carrow, patient will need to be scheduled for a recall colonoscopy with EMR at [facility] with Dr. Rush Landmark, correct?

## 2020-06-21 NOTE — Telephone Encounter (Signed)
That is correct.  I am not sure that my recalls for September have come out yet as I have not had them reach my desk but yes he will be due. Patty do want to go ahead and work on getting him set up for a colonoscopy with EMR 75-minute slot.  He does not need to be seen in clinic unless they feel he needs to.  The main thing will be to coordinate with [facility] to make sure he gets to his appointment.  Would use same preparation that I did for his most recent attempt/colonoscopy. Thanks. GM

## 2020-06-22 ENCOUNTER — Other Ambulatory Visit: Payer: Self-pay

## 2020-06-22 DIAGNOSIS — K635 Polyp of colon: Secondary | ICD-10-CM

## 2020-06-22 MED ORDER — PEG 3350-KCL-NA BICARB-NACL 420 G PO SOLR: 4000.0000 mL | Freq: Once | ORAL | 0 refills | Status: AC

## 2020-06-22 NOTE — Telephone Encounter (Signed)
Left message on machine to call back  Instructions mailed to the pt home

## 2020-06-22 NOTE — Telephone Encounter (Signed)
The pt has been scheduled for Colon EMR on 08/17/20 at 845 am at [facility] with Dr Rush Landmark

## 2020-06-23 ENCOUNTER — Telehealth: Payer: Self-pay | Admitting: *Deleted

## 2020-06-23 NOTE — Telephone Encounter (Signed)
Spoke with [place] at [facility]. Marlyn informed of patients scheduled appt of 08/17/20 at 10:15am. Wilhelmenia Blase is aware patient needs to arrive at [facility] at 8:45am. Instructions to be sent to patient's home. Nothing further at this time.

## 2020-06-23 NOTE — Telephone Encounter (Signed)
See prior Note

## 2020-07-03 ENCOUNTER — Telehealth: Payer: Self-pay

## 2020-07-03 NOTE — Telephone Encounter (Signed)
New instructions mailed 

## 2020-07-03 NOTE — Telephone Encounter (Signed)
-----   Message from Algernon Huxley, RN sent at 06/23/2020  9:30 AM EDT ----- Regarding: Updated instructions [facility] the appt had to be moved to 08/17/20 at 10:15am. [facility] was notified but please sent the pt updated instructions.

## 2020-08-11 ENCOUNTER — Telehealth: Payer: Self-pay

## 2020-08-11 NOTE — Telephone Encounter (Signed)
I have called Endo and added the EGD and CPT code.  I have also sent a message to [facility] letting her know of the addon

## 2020-08-11 NOTE — Telephone Encounter (Signed)
-----   Message from Irving Copas., MD sent at 08/11/2020  1:13 PM EDT ----- Regarding: EGD add-on for Patient next week Camdynn Maranto, Please add-on an EGD to this patient's procedure next week. History of H. Pylori and gastritis and did not followup with HP stool antigen testing. Will perform repeat EGD to ensure healing and follow up H. Pylori. Thanks. GM

## 2020-08-15 ENCOUNTER — Telehealth: Payer: Self-pay | Admitting: Gastroenterology

## 2020-08-15 NOTE — Telephone Encounter (Signed)
Left message on machine to call back  

## 2020-08-16 NOTE — Telephone Encounter (Signed)
Left message on machine to call back  

## 2020-08-16 NOTE — Telephone Encounter (Signed)
I spoke with Leda Gauze and rescheduled the pt to 9/22 at 845 am at [facility].  She has updated instructions and will call if she has any further questions or concerns.

## 2020-08-18 ENCOUNTER — Telehealth: Payer: Self-pay | Admitting: Gastroenterology

## 2020-08-18 NOTE — Telephone Encounter (Signed)
The pt has been scheduled for 10/20 at 1130 am.  Billy Torres with [name] has been advised and given new instructions, date and time. She will call if there are any further concerns.

## 2020-10-17 ENCOUNTER — Encounter (HOSPITAL_COMMUNITY): Payer: Self-pay | Admitting: Gastroenterology

## 2020-10-17 NOTE — Progress Notes (Signed)
Attempted to obtain medical history for pre surgical testing via telephone, unable to reach at this time. Voicemail not setup to leave message.

## 2020-10-25 ENCOUNTER — Telehealth: Payer: Self-pay | Admitting: Gastroenterology

## 2020-10-25 ENCOUNTER — Encounter (HOSPITAL_COMMUNITY): Payer: Self-pay | Admitting: Vascular Surgery

## 2020-10-25 ENCOUNTER — Encounter (HOSPITAL_COMMUNITY): Payer: Self-pay | Admitting: Gastroenterology

## 2020-10-25 MED ORDER — PEG 3350-KCL-NA BICARB-NACL 420 G PO SOLR: 4000.0000 mL | Freq: Once | ORAL | 0 refills | Status: AC

## 2020-10-25 NOTE — Telephone Encounter (Signed)
Left message on machine to call back  

## 2020-10-25 NOTE — Progress Notes (Signed)
Patient is at [facility].  Spoke with Rollene Fare [phone] at [facility] for PAT information and instructions for DOS.  Faxed instructions to [phone] ATTN: Rollene Fare.  PCP - Vassie Moment, NP Cardiologist - n/a  Chest x-ray - 05/20/19 (1V) EKG - DOS 05/26/20 Stress Test - n/a ECHO - 05/15/15 Cardiac Cath - n/a  ICD Pacemaker/Loop - n/a  Sleep Study -  n/a CPAP - none  Fasting Blood Sugar - unknown Checks Blood Sugar once a week on Tuesday.  Do not take Metformin and Trulicity on the morning of surgery.  If your blood sugar is less than 70 mg/dL, you will need to treat for low blood sugar: Treat a low blood sugar (less than 70 mg/dL) with  cup of clear juice (cranberry or apple), 4 glucose tablets, OR glucose gel. Recheck blood sugar in 15 minutes after treatment (to make sure it is greater than 70 mg/dL). If your blood sugar is not greater than 70 mg/dL on recheck, call [phone] for further instructions.   ERAS: Follow MD's instructions for Prep/Diet.  Anesthesia review: Yes  STOP now taking any Aspirin (unless otherwise instructed by your surgeon), Aleve, Naproxen, Ibuprofen, Motrin, Advil, Goody's, BC's, all herbal medications, fish oil, and all vitamins.   Coronavirus Screening Covid test n/a Ambulatory Surgery  Does patient have any of the following symptoms:  Cough yes/no: No Fever (>100.65F)  yes/no: No Runny nose yes/no: No Sore throat yes/no: No Difficulty breathing/shortness of breath  yes/no: No  Have you traveled in the last 14 days and where? yes/no: No  Rollene Fare verbalized understanding of instructions that were given via phone. Instructions were also faxed to [phone].

## 2020-10-25 NOTE — Anesthesia Preprocedure Evaluation (Deleted)
Anesthesia Evaluation    Airway        Dental   Pulmonary former smoker,           Cardiovascular hypertension,      Neuro/Psych    GI/Hepatic   Endo/Other  diabetes  Renal/GU      Musculoskeletal   Abdominal   Peds  Hematology   Anesthesia Other Findings   Reproductive/Obstetrics                             Anesthesia Physical Anesthesia Plan  ASA:   Anesthesia Plan:    Post-op Pain Management:    Induction:   PONV Risk Score and Plan:   Airway Management Planned:   Additional Equipment:   Intra-op Plan:   Post-operative Plan:   Informed Consent:   Plan Discussed with:   Anesthesia Plan Comments: (PAT note written 10/25/2020 by Myra Gianotti, PA-C. )        Anesthesia Quick Evaluation

## 2020-10-25 NOTE — Telephone Encounter (Signed)
Agreed.    Billy Torres, you would be the one doing the procedure, so I think it is best if you make the call on whether or not to offer another procedure slot to this patient.  - HD

## 2020-10-25 NOTE — Telephone Encounter (Signed)
Per voice mail the instructions were faxed to [facility] at [phone] Prescription has also been sent to the pharmacy.

## 2020-10-25 NOTE — Progress Notes (Addendum)
Surgical Instructions for:   [name] Procedure Date: 10/26/20    Your procedure is scheduled on Thursday, 10/26/20..  Report to [facility] Entrance "A" at 10 A.M., then check in with the Admitting office.  Call this number if you have problems the morning of surgery:  [phone]   If you have any questions prior to your surgery date call [phone]: Open Monday-Friday 8am-4pm    Remember:.  Follow your surgeon's instructions for diet and prep.  If you have any questions, please call Dr Donneta Romberg Office at [phone].    Take these medicines the morning of surgery with A SIP OF WATER: Uroxatral Gabapentin Tylenol if needed Zyrtec if needed   Do not take Metformin and Trulicity on the Day of Surgery.  We will check your blood sugar levels upon arrival to the Short Stay Dept.    As of today, STOP taking any Aspirin (unless otherwise instructed by your surgeon) Aleve, Naproxen, Ibuprofen, Motrin, Advil, Goody's, BC's, all herbal medications, fish oil, and all vitamins.         Do not wear jewelry Do not wear lotions, powders, colognes, or deodorant. Men may shave face and neck. Do not bring valuables to the hospital. DO Not wear nail polish, gel polish, artificial nails, or any other type of covering on natural nails including finger and toenails. If patients have artificial nails, gel coating, etc. that need to be removed by a nail salon, please have this removed prior to surgery or surgery may need to be canceled/delayed if the surgeon/ anesthesia feels like the patient is unable to be adequately monitored.             [facility] is not responsible for any belongings or valuables.  Do NOT Smoke (Tobacco/Vaping)  24 hours prior to your procedure  If you use a CPAP at night, you may bring your mask for your overnight stay.   Contacts, glasses, hearing aids, dentures or partials may not be worn into surgery, please bring cases for these belongings    For patients admitted to the hospital, discharge time will be determined by your treatment team.   Patients discharged the day of surgery will not be allowed to drive home, and someone needs to stay with them for 24 hours.  NO VISITORS WILL BE ALLOWED IN PRE-OP WHERE PATIENTS ARE PREPPED FOR SURGERY.  ONLY 1 SUPPORT PERSON MAY BE PRESENT IN THE WAITING ROOM WHILE YOU ARE IN SURGERY.  IF YOU ARE TO BE ADMITTED, ONCE YOU ARE IN YOUR ROOM YOU WILL BE ALLOWED TWO (2) VISITORS. 1 (ONE) VISITOR MAY STAY OVERNIGHT BUT MUST ARRIVE TO THE ROOM BY 8pm.  Minor children may have two parents present. Special consideration for safety and communication needs will be reviewed on a case by case basis.  Special instructions:    Oral Hygiene is also important to reduce your risk of infection.  Remember - BRUSH YOUR TEETH THE MORNING OF SURGERY WITH YOUR REGULAR TOOTHPASTE

## 2020-10-25 NOTE — Telephone Encounter (Signed)
Dr Rush Landmark the pt has called to cancel the case for tomorrow.  He has cancelled or rescheduled on several occasions in the past.  Would you like to have him come into the office to discuss prior to rescheduling?

## 2020-10-25 NOTE — Telephone Encounter (Signed)
Billy Torres from [facility] calling to follow up on prep medication that the patient needs for procedure tomorrow. Tel: [phone]

## 2020-10-25 NOTE — Telephone Encounter (Signed)
Billy Torres, [facility], called to inform patient cannot have procedure scheduled at [facility] as his family members cannot come to be with him at that time.  Please call her to reschedule.  [phone].

## 2020-10-25 NOTE — Telephone Encounter (Signed)
Billy Torres, Thank you for the update and the conversation on the phone. Please schedule this patient who has canceled on 3 occasions for his follow-up colonoscopy with a clinic visit with me or with Dr. Loletha Carrow we cannot have him continue to cancel last minute. HD FYI on mutual patient. Thanks. GM

## 2020-10-25 NOTE — Progress Notes (Signed)
Anesthesia Chart Review: SAME DAY WORK-UP  Case: [id number] Date/Time: 10/26/20 1130   Procedures:      COLONOSCOPY WITH PROPOFOL     ENDOSCOPIC MUCOSAL RESECTION     ESOPHAGOGASTRODUODENOSCOPY (EGD) WITH PROPOFOL     COLONOSCOPY WITH PROPOFOL   Anesthesia type: Monitor Anesthesia Care   Pre-op diagnosis: cecal polyp   Location: [facility]   Surgeons: Mansouraty, Telford Nab., MD       DISCUSSION: Patient is a 65 year old male scheduled for the above procedure.  History includes former smoker, HTN, HLD, DM2 (with neuropathy), CKD, CVA (CVA 06/2013; CVA with left hemiparesis, gait disturbance, mild cognitive impairment 05/2015), carotid artery disease (1-39% BICA 05/15/15), PAD (s/p left SFA angioplasty 12/11/18; s/p left BKA 04/28/19), GI bleed (with rectal ulcer, s/p clipping 05/2019), H. Pylori (12/20/19), cecal tubulovillous adenoma (with high grade dysplasia 12/20/19, scheduled for mucosal resection 10/26/20), prostate cancer (s/p I-125 radioactive seed 05/30/16), BPH (with bladder outlet obstruction s/p [employer], 4 implants 08/11/17), spinal surgery (C5-6 ACDF 11/26/08).  He is a same-day work-up.  Labs and EKG as indicated on the day of procedure.  Anesthesia team to evaluate on the day of procedure.   VS: Ht 5\' 7"  (1.702 m)   Wt 71.5 kg   BMI 24.70 kg/m  BP Readings from Last 3 Encounters:  02/08/20 112/72  12/20/19 (!) 156/82  08/12/19 (!) 110/58   Pulse Readings from Last 3 Encounters:  02/08/20 67  12/20/19 66  08/12/19 77     PROVIDERS: [facility]. PCP is documented as Vassie Moment, NP.   LABS: For day of procedure as indicated. As of 12/20/19, Cr 0.90, H/H 16.0/47.0, glucose 141. A1c 6.1% on 05/20/19.   EKG: Last EKG 05/25/19 is > 43 year old but showed: Normal sinus rhythm with sinus arrhythmia Nonspecific T wave abnormality Abnormal ECG Confirmed by Carlyle Dolly [phone]) on 05/25/2019 5:44:14 PM   CV: Echo 05/15/15: Study  Conclusions  - Left ventricle: The cavity size was normal. Wall thickness was    increased in a pattern of mild LVH. Systolic function was    vigorous. The estimated ejection fraction was in the range of 65%    to 70%. Wall motion was normal; there were no regional wall    motion abnormalities. Codominant mitral inflow - indeterminate    diastolic function.  - Left atrium: The atrium was normal in size.  - Inferior vena cava: The vessel was normal in size. The    respirophasic diameter changes were in the normal range (>= 50%),    consistent with normal central venous pressure.   Impressions:  - LVEF 65-70%, mild LVH, normal wall motion, indeterminate    diastolic function, normal LA size, normal IVC.    US Carotid 05/15/15: Summary:  Bilateral: intimal wall thickening CCA. 1-39% ICA plaquing.  Vertebral artery flow is antegrade.    Past Medical History:  Diagnosis Date   Abnormal rate of speech    post stroke   Anticoagulant long-term use    history of plavix   Arthritis of left shoulder region    Chronic constipation    Chronic kidney disease    chronic kidney disease    Cognitive deficit due to old cerebral infarction    DDD (degenerative disc disease), cervical    Degenerative cervical spinal stenosis    Gait disturbance, post-stroke    uses cane/weelchair   History of ischemic left MCA stroke 06/27/2013   w/ evidence  hemorrhagia, high grade stenosis bilateral proximal MCA and extensive small matter disease--- symptoms resolved, no residual    History of ischemic stroke without residual deficits 05-14-2015---  residual left hemiparesis and cognitive deficits   small nonhemorrhegic infarct right corona radiata posterior limb and progressive right m2 stenosis   Hyperlipidemia    Hypertension    Insomnia    Internal hemorrhoids    Left hemiparesis (HCC)    RESIDUAL FROM STROKE--  WALKS W/ CANE   Lower GI bleed    Mild atherosclerosis of carotid artery, right    RICA  0-39% per duplex 06-28-2013   Neuromuscular disorder (HCC)    neuropathy - feet   OA (osteoarthritis)    LEFT SHOULDER   Painful bladder spasm    Peripheral arterial disease ([place])    Prostate cancer [facility]) urologist-  dr Gaynelle Arabian  oncologist-  dr [name]   dx 12/ 2017-- Stage T1c, Gleason 3+4,  PSA 10.4,  vol 51cc   Sensorineural hearing loss, bilateral    does not wear hearing aids   Stroke [facility])    Type 2 diabetes mellitus treated with insulin ([place])    Urge incontinence    w frequency, wears depends   Urinary retention    Previously required indwelling [facility]   Wears dentures     Past Surgical History:  Procedure Laterality Date   ABDOMINAL AORTOGRAM W/LOWER EXTREMITY N/A 12/11/2018   Procedure: ABDOMINAL AORTOGRAM W/LOWER EXTREMITY;  Surgeon: Angelia Mould, MD;  Location: [place] [facility];  Service: Cardiovascular;  Laterality: N/A;   ABDOMINAL AORTOGRAM W/LOWER EXTREMITY Bilateral 04/02/2019   Procedure: ABDOMINAL AORTOGRAM W/LOWER EXTREMITY;  Surgeon: Angelia Mould, MD;  Location: [place] [facility];  Service: Cardiovascular;  Laterality: Bilateral;   AMPUTATION Left 04/28/2019   Procedure: LEFT BELOW KNEE AMPUTATION;  Surgeon: Newt Minion, MD;  Location: [place];  Service: Orthopedics;  Laterality: Left;   ANTERIOR CERVICAL DECOMP/DISCECTOMY FUSION  11/27/1999   C5 -- C6   BELOW KNEE LEG AMPUTATION Left 04/28/2019   BIOPSY  05/21/2019   Procedure: BIOPSY;  Surgeon: Doran Stabler, MD;  Location: [place];  Service: Gastroenterology;;   BIOPSY  12/20/2019   Procedure: BIOPSY;  Surgeon: Irving Copas., MD;  Location: [place];  Service: Gastroenterology;;   CIRCUMCISION  10/07/2001   COLONOSCOPY WITH PROPOFOL N/A 05/21/2019   Procedure: COLONOSCOPY WITH PROPOFOL;  Surgeon: Doran Stabler, MD;  Location: [place];  Service: Gastroenterology;  Laterality: N/A;   COLONOSCOPY WITH PROPOFOL N/A 12/20/2019   Procedure: COLONOSCOPY  WITH PROPOFOL;  Surgeon: Rush Landmark Telford Nab., MD;  Location: [place];  Service: Gastroenterology;  Laterality: N/A;   CYSTOSCOPY WITH INSERTION OF UROLIFT N/A 08/11/2017   Procedure: CYSTOSCOPY WITH INSERTION OF UROLIFT;  Surgeon: Cleon Gustin, MD;  Location: [facility];  Service: Urology;  Laterality: N/A;   ENDOSCOPIC MUCOSAL RESECTION N/A 12/20/2019   Procedure: ENDOSCOPIC MUCOSAL RESECTION;  Surgeon: Rush Landmark Telford Nab., MD;  Location: [place];  Service: Gastroenterology;  Laterality: N/A;   ESOPHAGOGASTRODUODENOSCOPY (EGD) WITH PROPOFOL N/A 12/20/2019   Procedure: ESOPHAGOGASTRODUODENOSCOPY (EGD) WITH PROPOFOL;  Surgeon: Rush Landmark Telford Nab., MD;  Location: [place];  Service: Gastroenterology;  Laterality: N/A;   FLEXIBLE SIGMOIDOSCOPY N/A 05/23/2019   Procedure: FLEXIBLE SIGMOIDOSCOPY;  Surgeon: Doran Stabler, MD;  Location: [place];  Service: Gastroenterology;  Laterality: N/A;   FLEXIBLE SIGMOIDOSCOPY N/A 05/26/2019   Procedure: FLEXIBLE SIGMOIDOSCOPY;  Surgeon: Milus Banister, MD;  [facility];  Service: Endoscopy;  Laterality: N/A;   FOOT SURGERY  2005   HEMOSTASIS CLIP PLACEMENT  05/23/2019   Procedure: HEMOSTASIS CLIP PLACEMENT;  Surgeon: Doran Stabler, MD;  Location: [facility] ENDOSCOPY;  Service: Gastroenterology;;   HEMOSTASIS CLIP PLACEMENT  05/26/2019   Procedure: HEMOSTASIS CLIP PLACEMENT;  Surgeon: Milus Banister, MD;  Location: [facility] ENDOSCOPY;  Service: Endoscopy;;   HEMOSTASIS CLIP PLACEMENT  12/20/2019   Procedure: HEMOSTASIS CLIP PLACEMENT;  Surgeon: Irving Copas., MD;  Location: [place];  Service: Gastroenterology;;   PERIPHERAL VASCULAR BALLOON ANGIOPLASTY  12/11/2018   Procedure: PERIPHERAL VASCULAR BALLOON ANGIOPLASTY;  Surgeon: Angelia Mould, MD;  Location: [place] [facility];  Service: Cardiovascular;;  Lt. SFA   POLYPECTOMY  12/20/2019   Procedure: POLYPECTOMY;  Surgeon: Rush Landmark Telford Nab., MD;   Location: [place];  Service: Gastroenterology;;   PROSTATE BIOPSY  12/12/2015   RADIOACTIVE SEED IMPLANT N/A 05/30/2016   Procedure: RADIOACTIVE SEED IMPLANT/BRACHYTHERAPY IMPLANT WITH SPACE OAR;  Surgeon: Carolan Clines, MD;  Location: [facility];  Service: Urology;  Laterality: N/A;   SHOULDER ARTHROSCOPY WITH SUBACROMIAL DECOMPRESSION Left 11/28/2006   Debridement/ Acromioplasty/  CA ligament relesase   SUBMUCOSAL LIFTING INJECTION  12/20/2019   Procedure: SUBMUCOSAL LIFTING INJECTION;  Surgeon: Irving Copas., MD;  Location: [place];  Service: Gastroenterology;;   TRANSTHORACIC ECHOCARDIOGRAM  05/15/2015   mild LVH,  ef 65-70%, codominant mitral inflow-iindeterminate diastolic function/      MEDICATIONS: No current facility-administered medications for this encounter.    acetaminophen (TYLENOL) 500 MG tablet   alfuzosin (UROXATRAL) 10 MG 24 hr tablet   atorvastatin (LIPITOR) 40 MG tablet   cetirizine (ZYRTEC) 10 MG tablet   Cholecalciferol (VITAMIN D3) 50 MCG (2000 UT) TABS   donepezil (ARICEPT) 10 MG tablet   Dulaglutide (TRULICITY) 1.28 NO/6.7EH SOPN   gabapentin (NEURONTIN) 600 MG tablet   melatonin 5 MG TABS   metFORMIN (GLUCOPHAGE) 500 MG tablet   mineral oil-hydrophilic petrolatum (AQUAPHOR) ointment   polyethylene glycol (MIRALAX / GLYCOLAX) 17 g packet   senna-docusate (SENOKOT-S) 8.6-50 MG tablet   simethicone (MYLICON) 80 MG chewable tablet   vardenafil (LEVITRA) 10 MG tablet   omeprazole (PRILOSEC) 40 MG capsule    Myra Gianotti, PA-C Surgical Short Stay/Anesthesiology [facility] Phone [phone] [facility] Phone [phone] 10/25/2020 11:53 AM

## 2020-10-26 ENCOUNTER — Encounter (HOSPITAL_COMMUNITY)
Admission: RE | Disposition: A | Discharge status: Home / Self Care | Payer: Self-pay | Source: Ambulatory Visit | Attending: Gastroenterology

## 2020-10-26 SURGERY — COLONOSCOPY WITH PROPOFOL
Anesthesia: Monitor Anesthesia Care

## 2020-10-26 NOTE — Telephone Encounter (Signed)
Message left with Leda Gauze to return call to set up office visit with Dr Rush Landmark

## 2020-10-27 NOTE — Telephone Encounter (Signed)
I spoke with Billy Torres and she has scheduled the pt for an appt on 12/08/20 at 250 pm with Dr Rush Landmark. She also states that  [name] will be having a meeting with the son and the pt to discuss missed appts.

## 2020-12-05 IMAGING — CR DG CHEST 2V
2 series · 2 of 2 positions shown · non-contrast
Comparison: 03/29/2016

CLINICAL DATA: Cough, shortness of breath. History of hypertension
and smoking.

EXAM:
CHEST - 2 VIEW

[w chest pa]
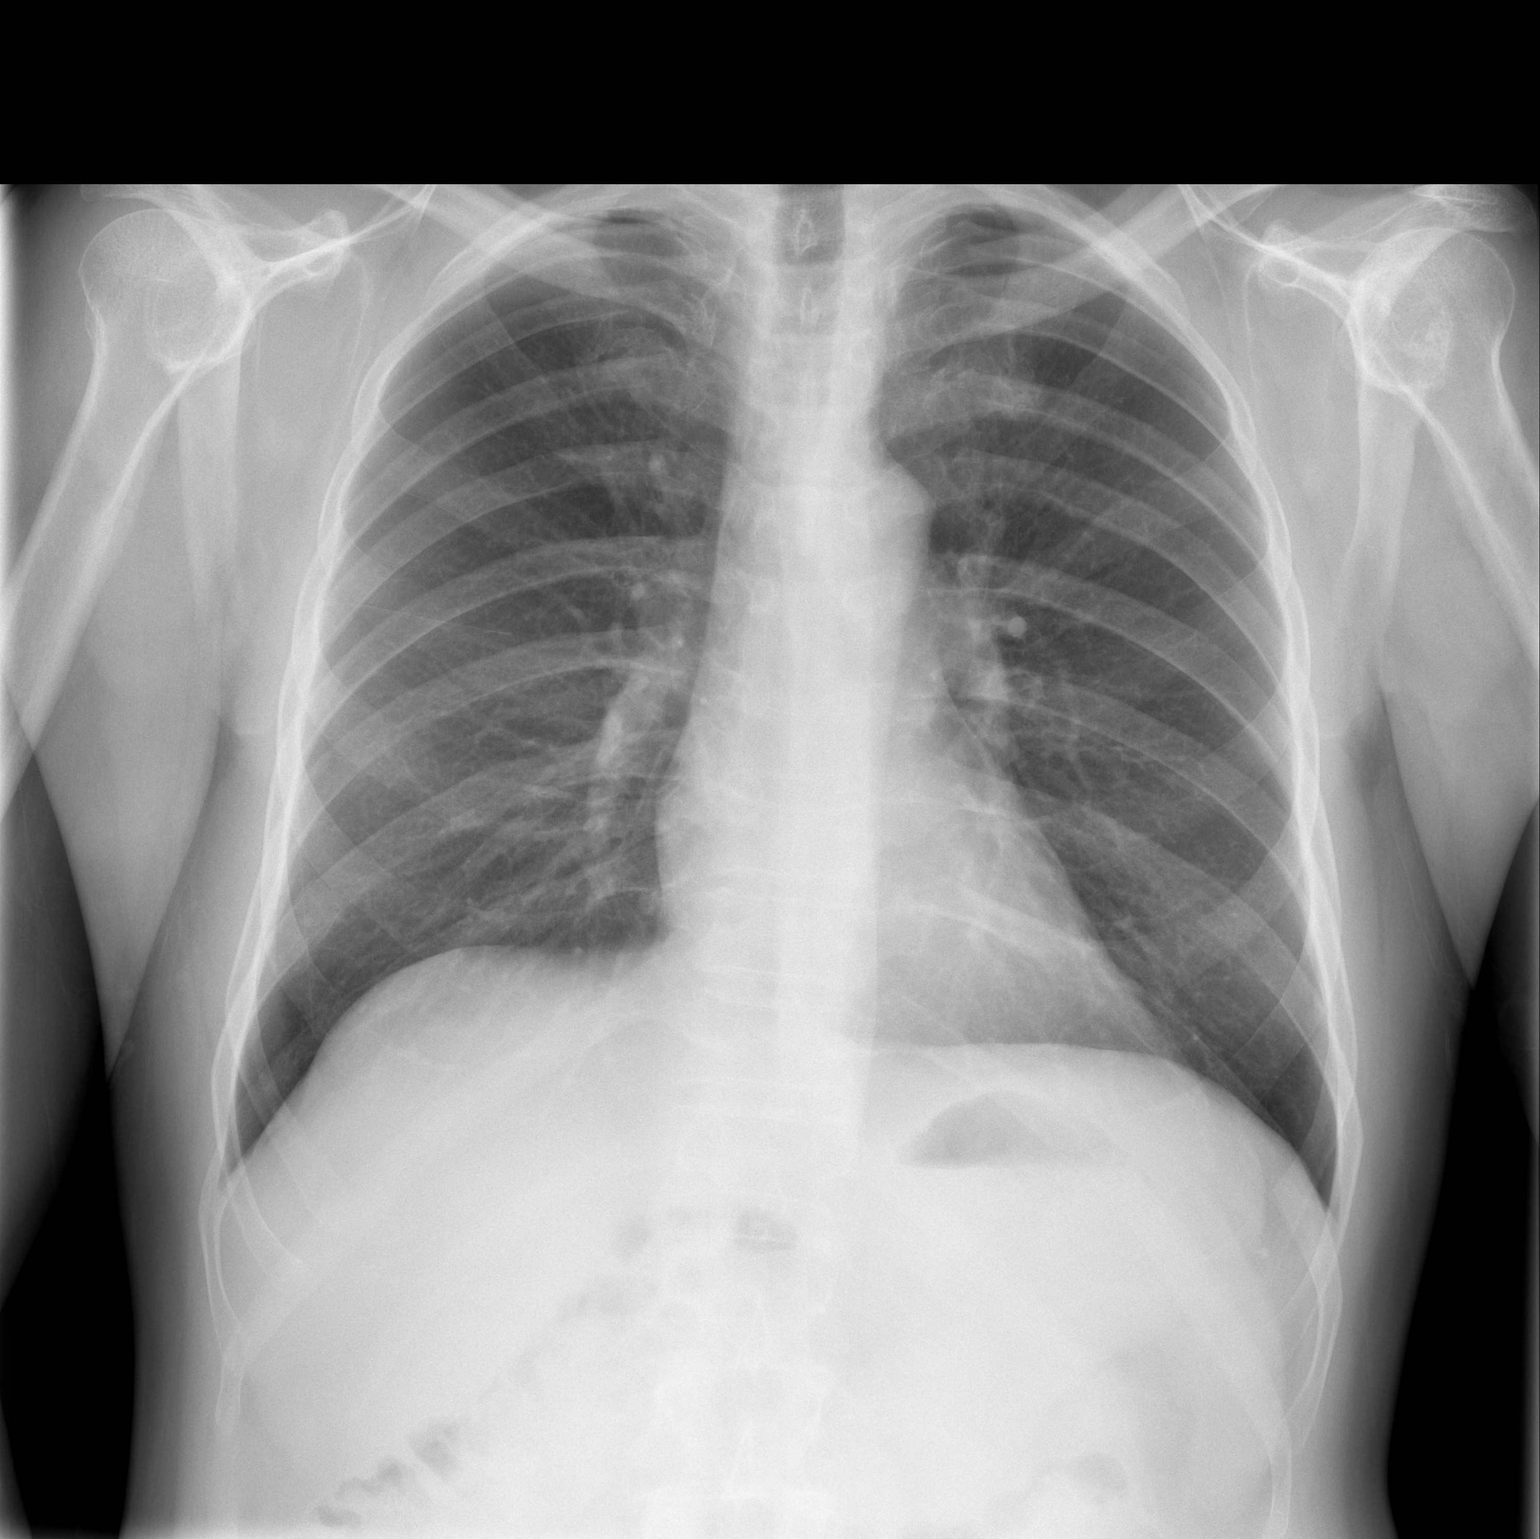

[w chest lat]
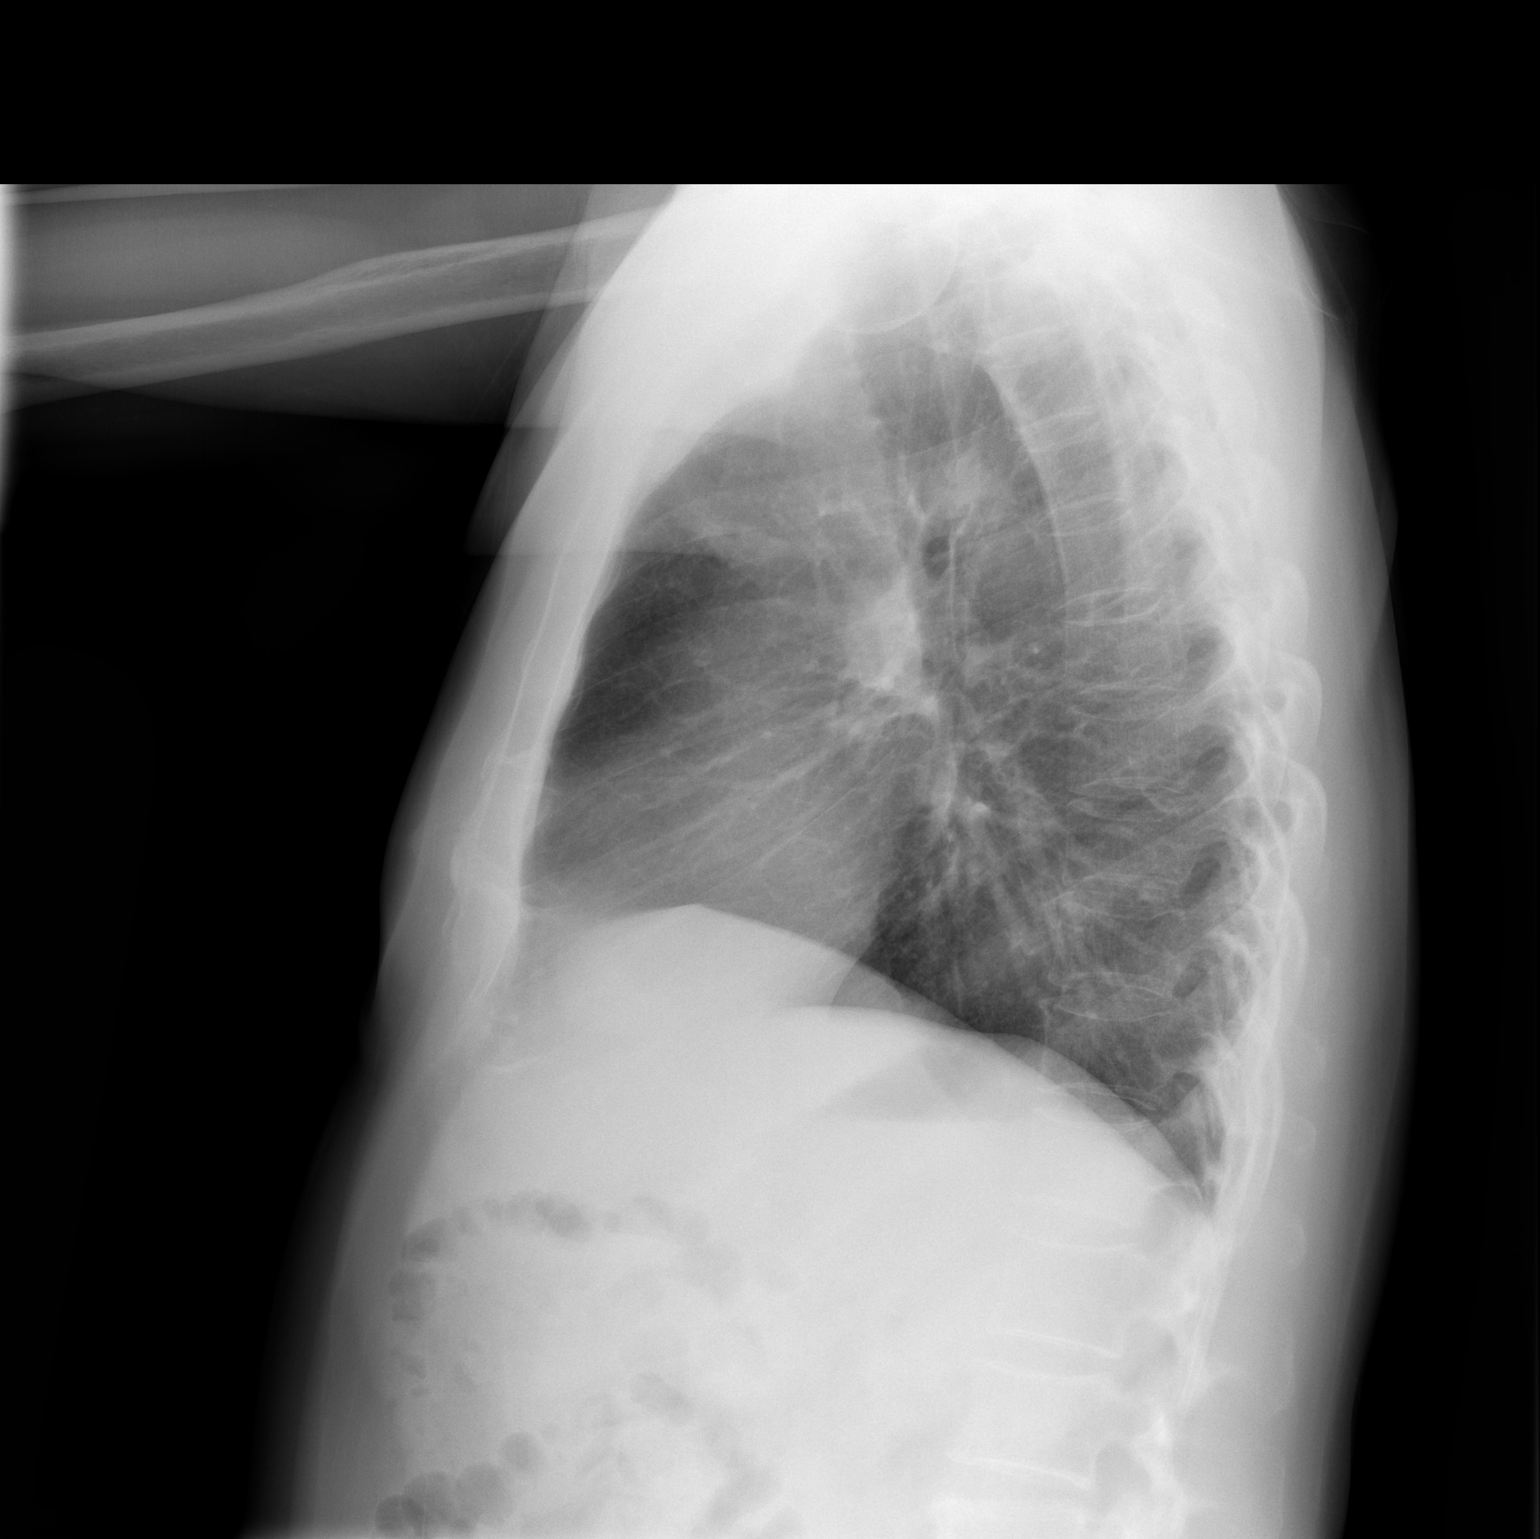

[2 of 2 positions shown; findings below may reference images not displayed]

FINDINGS: Normal heart, mediastinum and hila.

The lungs are clear.  No pleural effusion or pneumothorax.

Skeletal structures are unremarkable.
IMPRESSION: No active cardiopulmonary disease.

## 2020-12-08 ENCOUNTER — Encounter: Payer: Self-pay | Admitting: Gastroenterology

## 2020-12-08 ENCOUNTER — Ambulatory Visit (INDEPENDENT_AMBULATORY_CARE_PROVIDER_SITE_OTHER): Payer: Medicare (Managed Care) | Admitting: Gastroenterology

## 2020-12-08 ENCOUNTER — Other Ambulatory Visit (INDEPENDENT_AMBULATORY_CARE_PROVIDER_SITE_OTHER): Payer: Medicare (Managed Care)

## 2020-12-08 VITALS — BP 102/80 | HR 74 | Ht 68.0 in | Wt 151.0 lb

## 2020-12-08 DIAGNOSIS — Z86010 Personal history of colonic polyps: Secondary | ICD-10-CM

## 2020-12-08 DIAGNOSIS — K635 Polyp of colon: Secondary | ICD-10-CM | POA: Diagnosis not present

## 2020-12-08 DIAGNOSIS — R197 Diarrhea, unspecified: Secondary | ICD-10-CM

## 2020-12-08 DIAGNOSIS — R634 Abnormal weight loss: Secondary | ICD-10-CM

## 2020-12-08 DIAGNOSIS — A048 Other specified bacterial intestinal infections: Secondary | ICD-10-CM

## 2020-12-08 LAB — CBC
HCT: 44.1 % (ref 39.0–52.0)
Hemoglobin: 14.4 g/dL (ref 13.0–17.0)
MCHC: 32.6 g/dL (ref 30.0–36.0)
MCV: 84.2 fl (ref 78.0–100.0)
Platelets: 258 10*3/uL (ref 150.0–400.0)
RBC: 5.24 Mil/uL (ref 4.22–5.81)
RDW: 13.5 % (ref 11.5–15.5)
WBC: 7.9 10*3/uL (ref 4.0–10.5)

## 2020-12-08 LAB — C-REACTIVE PROTEIN: CRP: <1 mg/dL (ref 0.5–20.0)

## 2020-12-08 LAB — TSH: TSH: 0.42 u[IU]/mL (ref 0.35–5.50)

## 2020-12-08 LAB — PROTIME-INR
INR: 1 ratio (ref 0.8–1.0)
Prothrombin Time: 11.2 s (ref 9.6–13.1)

## 2020-12-08 LAB — SEDIMENTATION RATE: Sed Rate: 22 mm/hr — ABNORMAL HIGH (ref 0–20)

## 2020-12-08 MED ORDER — PEG 3350-KCL-NA BICARB-NACL 420 G PO SOLR: 4000.0000 mL | Freq: Once | ORAL | 0 refills | Status: AC

## 2020-12-08 NOTE — Patient Instructions (Addendum)
You have been scheduled for a colonoscopy. Please follow written instructions given to you at your visit today.  Please pick up your prep supplies at the pharmacy within the next 1-3 days. If you use inhalers (even only as needed), please bring them with you on the day of your procedure.  We have sent the following medications to your pharmacy for you to pick up at your convenience: Golyt[place]  Your provider has requested that you go to the basement level for lab work before leaving today. Press "B" on the elevator. The lab is located at the first door on the left as you exit the elevator.   If you are age 18 or older, your body mass index should be between 23-30. Your Body mass index is 22.96 kg/m. If this is out of the aforementioned range listed, please consider follow up with your Primary Care Provider.   The [place] [facility] providers would like to encourage you to use [facility] to communicate with providers for non-urgent requests or questions.  Due to long hold times on the telephone, sending your provider a message by [facility] may be a faster and more efficient way to get a response.  Please allow 48 business hours for a response.  Please remember that this is for non-urgent requests.   Thank you for choosing me and [facility].  Dr. Rush Landmark

## 2020-12-11 ENCOUNTER — Encounter: Payer: Self-pay | Admitting: Gastroenterology

## 2020-12-11 NOTE — Progress Notes (Signed)
[place] VISIT   Primary Care Provider Patient, No Pcp Per (Inactive) No address on file None  Referring Provider Dr. Loletha Carrow  Patient Profile: Billy Torres is a 65 y.o. male with a pmh significant for status post CVA (no longer on Plavix), chronic renal insufficiency, peripheral arterial disease, prior prostate cancer, diabetes, diverticulosis, prior colon polyps (large cecal and rectal polyp status post EMRs), prior stercoral ulceration, H. pylori infection (status post treatment unclear eradication), IDA.  The patient presents to the [facility] for an evaluation and management of problem(s) noted below:  Problem List 1. Cecal polyp   2. Hx of adenomatous colonic polyps   3. Unintentional weight loss   4. Diarrhea, unspecified type   5. H. pylori infection     History of Present Illness Please see prior progress notes by Dr. Loletha Carrow and myself for full details of HPI.  Interval History The patient underwent colonoscopy in 2021 with multiple polyps removed via EMR.  We scheduled the patient for 2 follow-up colonoscopies for which the patient did not show up to either.  It is for this reason that the patient is scheduled for a clinic visit today so that we can determine whether we are going to continue to be his providers or not in regards to follow-up of his previous large colon polyp resections.  The patient states that he is not having any abdominal pain.  However, the patient does state that he is having diarrhea a few days a week.  His daughter-in-law states that she has not been aware of him having significant bouts of diarrhea that had been told to them by [facility].  If the patient even has a bowel movement he is only having 1-2 diarrheal movements per day if that per the patient.  There is no blood in his stools.  Patient has had some weight loss over the course of the last few months.  Patient's living situation will be transitioning  in the next few weeks.  The patient and patient's daughter-in-law are still interested in moving forward with surveillance of the prior resections and will work on trying to have this rescheduled.  GI Review of Systems Positive as above Negative for odynophagia, dysphagia, nausea, vomiting, abdominal pain, melena, hematochezia   Review of Systems General: Positive for unintentional weight loss; denies fevers/chills Cardiovascular: Denies chest pain Pulmonary: Denies shortness of breath Gastroenterological: See HPI Genitourinary: Denies darkened urine Hematological: Denies easy bruising/bleeding Dermatological: Denies jaundice Psychological: Mood is stable   Medications Current Outpatient Medications  Medication Sig Dispense Refill   acetaminophen (TYLENOL) 500 MG tablet Take 1,000 mg by mouth 3 (three) times daily as needed (pain).     alfuzosin (UROXATRAL) 10 MG 24 hr tablet Take 10 mg by mouth daily with breakfast.     atorvastatin (LIPITOR) 40 MG tablet Take 40 mg by mouth at bedtime.     cetirizine (ZYRTEC) 10 MG tablet Take 10 mg by mouth 3 times/day as needed-between meals & bedtime for allergies.     Cholecalciferol (VITAMIN D3) 50 MCG (2000 UT) TABS Take 2,000 Units by mouth in the morning.     donepezil (ARICEPT) 10 MG tablet Take 10 mg by mouth at bedtime.     Dulaglutide (TRULICITY) 7.34 LP/3.7TK SOPN Inject 0.75 mg into the skin once a week. Takes on Tuesday     gabapentin (NEURONTIN) 600 MG tablet Take 600 mg by mouth in the morning and at bedtime.     melatonin  5 MG TABS Take 5 mg by mouth at bedtime.     metFORMIN (GLUCOPHAGE) 500 MG tablet Take 500 mg by mouth 2 (two) times daily with a meal.      mineral oil-hydrophilic petrolatum (AQUAPHOR) ointment Apply 1 application topically See admin instructions. Apply twice daily for right heel and as needed for dry skin     omeprazole (PRILOSEC) 40 MG capsule Take 1 capsule (40 mg total) by mouth daily. 30 capsule 5    polyethylene glycol (MIRALAX / GLYCOLAX) 17 g packet Take 17 g by mouth daily as needed (constipation.).     senna-docusate (SENOKOT-S) 8.6-50 MG tablet Take 2 tablets by mouth at bedtime as needed for mild constipation.     simethicone (MYLICON) 80 MG chewable tablet Chew 80 mg by mouth 4 (four) times daily as needed for flatulence.     vardenafil (LEVITRA) 10 MG tablet Take 10 mg by mouth daily as needed for erectile dysfunction.     No current facility-administered medications for this visit.    Allergies No Known Allergies  Histories Past Medical History:  Diagnosis Date   Abnormal rate of speech    post stroke   Anticoagulant long-term use    history of plavix   Arthritis of left shoulder region    Chronic constipation    Chronic kidney disease    chronic kidney disease    Cognitive deficit due to old cerebral infarction    DDD (degenerative disc disease), cervical    Degenerative cervical spinal stenosis    Gait disturbance, post-stroke    uses cane/weelchair   History of ischemic left MCA stroke 06/27/2013   w/ evidence hemorrhagia, high grade stenosis bilateral proximal MCA and extensive small matter disease--- symptoms resolved, no residual    History of ischemic stroke without residual deficits 05-14-2015---  residual left hemiparesis and cognitive deficits   small nonhemorrhegic infarct right corona radiata posterior limb and progressive right m2 stenosis   Hyperlipidemia    Hypertension    Insomnia    Internal hemorrhoids    Left hemiparesis (HCC)    RESIDUAL FROM STROKE--  WALKS W/ CANE   Lower GI bleed    Mild atherosclerosis of carotid artery, right    RICA 0-39% per duplex 06-28-2013   Neuromuscular disorder (HCC)    neuropathy - feet   OA (osteoarthritis)    LEFT SHOULDER   Painful bladder spasm    Peripheral arterial disease ([place])    Prostate cancer [facility]) urologist-  dr Gaynelle Arabian  oncologist-  dr [name]   dx 12/ 2017-- Stage T1c, Gleason 3+4,  PSA  10.4,  vol 51cc   Sensorineural hearing loss, bilateral    does not wear hearing aids   Stroke [facility])    Type 2 diabetes mellitus treated with insulin ([place])    Urge incontinence    w frequency, wears depends   Urinary retention    Previously required indwelling Foley   Wears dentures    Past Surgical History:  Procedure Laterality Date   ABDOMINAL AORTOGRAM W/LOWER EXTREMITY N/A 12/11/2018   Procedure: ABDOMINAL AORTOGRAM W/LOWER EXTREMITY;  Surgeon: Angelia Mould, MD;  Location: [place] [facility];  Service: Cardiovascular;  Laterality: N/A;   ABDOMINAL AORTOGRAM W/LOWER EXTREMITY Bilateral 04/02/2019   Procedure: ABDOMINAL AORTOGRAM W/LOWER EXTREMITY;  Surgeon: Angelia Mould, MD;  Location: [place] [facility];  Service: Cardiovascular;  Laterality: Bilateral;   AMPUTATION Left 04/28/2019   Procedure: LEFT BELOW KNEE AMPUTATION;  Surgeon: Meridee Score  V, MD;  Location: [place];  Service: Orthopedics;  Laterality: Left;   ANTERIOR CERVICAL DECOMP/DISCECTOMY FUSION  11/27/1999   C5 -- C6   BELOW KNEE LEG AMPUTATION Left 04/28/2019   BIOPSY  05/21/2019   Procedure: BIOPSY;  Surgeon: Doran Stabler, MD;  Location: [place];  Service: Gastroenterology;;   BIOPSY  12/20/2019   Procedure: BIOPSY;  Surgeon: Irving Copas., MD;  Location: [place];  Service: Gastroenterology;;   CIRCUMCISION  10/07/2001   COLONOSCOPY WITH PROPOFOL N/A 05/21/2019   Procedure: COLONOSCOPY WITH PROPOFOL;  Surgeon: Doran Stabler, MD;  Location: [place];  Service: Gastroenterology;  Laterality: N/A;   COLONOSCOPY WITH PROPOFOL N/A 12/20/2019   Procedure: COLONOSCOPY WITH PROPOFOL;  Surgeon: Rush Landmark Telford Nab., MD;  Location: [place];  Service: Gastroenterology;  Laterality: N/A;   CYSTOSCOPY WITH INSERTION OF UROLIFT N/A 08/11/2017   Procedure: CYSTOSCOPY WITH INSERTION OF UROLIFT;  Surgeon: Cleon Gustin, MD;  Location: [facility];  Service: Urology;  Laterality:  N/A;   ENDOSCOPIC MUCOSAL RESECTION N/A 12/20/2019   Procedure: ENDOSCOPIC MUCOSAL RESECTION;  Surgeon: Rush Landmark Telford Nab., MD;  Location: [place];  Service: Gastroenterology;  Laterality: N/A;   ESOPHAGOGASTRODUODENOSCOPY (EGD) WITH PROPOFOL N/A 12/20/2019   Procedure: ESOPHAGOGASTRODUODENOSCOPY (EGD) WITH PROPOFOL;  Surgeon: Rush Landmark Telford Nab., MD;  Location: [place];  Service: Gastroenterology;  Laterality: N/A;   FLEXIBLE SIGMOIDOSCOPY N/A 05/23/2019   Procedure: FLEXIBLE SIGMOIDOSCOPY;  Surgeon: Doran Stabler, MD;  Location: [place];  Service: Gastroenterology;  Laterality: N/A;   FLEXIBLE SIGMOIDOSCOPY N/A 05/26/2019   Procedure: FLEXIBLE SIGMOIDOSCOPY;  Surgeon: Milus Banister, MD;  Location: [facility] ENDOSCOPY;  Service: Endoscopy;  Laterality: N/A;   FOOT SURGERY  2005   HEMOSTASIS CLIP PLACEMENT  05/23/2019   Procedure: HEMOSTASIS CLIP PLACEMENT;  Surgeon: Doran Stabler, MD;  Location: [facility] ENDOSCOPY;  Service: Gastroenterology;;   HEMOSTASIS CLIP PLACEMENT  05/26/2019   Procedure: HEMOSTASIS CLIP PLACEMENT;  Surgeon: Milus Banister, MD;  Location: [facility];  Service: Endoscopy;;   HEMOSTASIS CLIP PLACEMENT  12/20/2019   Procedure: HEMOSTASIS CLIP PLACEMENT;  Surgeon: Irving Copas., MD;  Location: [place];  Service: Gastroenterology;;   PERIPHERAL VASCULAR BALLOON ANGIOPLASTY  12/11/2018   Procedure: PERIPHERAL VASCULAR BALLOON ANGIOPLASTY;  Surgeon: Angelia Mould, MD;  Location: [place] [facility];  Service: Cardiovascular;;  Lt. SFA   POLYPECTOMY  12/20/2019   Procedure: POLYPECTOMY;  Surgeon: Rush Landmark Telford Nab., MD;  Location: [place];  Service: Gastroenterology;;   PROSTATE BIOPSY  12/12/2015   RADIOACTIVE SEED IMPLANT N/A 05/30/2016   Procedure: RADIOACTIVE SEED IMPLANT/BRACHYTHERAPY IMPLANT WITH SPACE OAR;  Surgeon: Carolan Clines, MD;  Location: [facility];  Service: Urology;  Laterality: N/A;    SHOULDER ARTHROSCOPY WITH SUBACROMIAL DECOMPRESSION Left 11/28/2006   Debridement/ Acromioplasty/  CA ligament relesase   SUBMUCOSAL LIFTING INJECTION  12/20/2019   Procedure: SUBMUCOSAL LIFTING INJECTION;  Surgeon: Irving Copas., MD;  Location: [place];  Service: Gastroenterology;;   TRANSTHORACIC ECHOCARDIOGRAM  05/15/2015   mild LVH,  ef 65-70%, codominant mitral inflow-iindeterminate diastolic function/     Social History   Socioeconomic History   Marital status: Divorced    Spouse name: Not on file   Number of children: Not on file   Years of education: Not on file   Highest education level: Not on file  Occupational History   Not on file  Tobacco Use   Smoking status: Former    Packs/day:  0.25    Years: 45.00    Pack years: 11.25    Types: Cigarettes   Smokeless tobacco: Never  Vaping Use   Vaping Use: Never used  Substance and Sexual Activity   Alcohol use: No   Drug use: No   Sexual activity: Yes  Other Topics Concern   Not on file  Social History Narrative   Not on file   Social Determinants of Health   Financial Resource Strain: Not on file  Food Insecurity: Not on file  Transportation Needs: Not on file  Physical Activity: Not on file  Stress: Not on file  Social Connections: Not on file  Intimate Partner Violence: Not on file   Family History  Problem Relation Age of Onset   Hypertension Mother    Hypertension Father    Cancer Brother        prostate   I have reviewed his medical, social, and family history in detail and updated the electronic medical record as necessary.    PHYSICAL EXAMINATION  BP 102/80   Pulse 74   Ht 5\' 8"  (1.727 m)   Wt 151 lb (68.5 kg)   BMI 22.96 kg/m  Wt Readings from Last 3 Encounters:  12/08/20 151 lb (68.5 kg)  02/08/20 162 lb 12.8 oz (73.8 kg)  12/20/19 160 lb (72.6 kg)  GEN: NAD, appears stated age, appears chronically ill, in wheelchair, accompanied by daughter-in-law  PSYCH: Cooperative,  without pressured speech EYE: Conjunctivae pink, sclerae anicteric ENT: MMM CV: Nontachycardic RESP: No audible wheezing GI: NABS, soft, NT/ND, without rebound or guarding SKIN: No jaundice NEURO:  Alert & Oriented x 3, no focal deficits   REVIEW OF DATA  I reviewed the following data at the time of this encounter:  GI Procedures and Studies  December 2021 colonoscopy - Hemorrhoids found on digital rectal exam. - Stool in the entire examined colon. Lavaged with adequate visualization. - The examined portion of the ileum was normal. - One 45 mm polyp in the cecum encroaching towards the ICV but not entering, removed with piecemeal cold mucosal resection. Resected and retrieved. Clips (MR conditional) were placed. - Five 3 to 9 mm polyps in the descending colon, in the transverse colon, in the ascending colon and in the cecum, removed with a cold snare. Resected and retrieved. - One 15 mm polyp in the distal rectum, removed with mucosal resection. Resected and retrieved. Clips (MR conditional) were placed. - Diverticulosis in the recto-sigmoid colon and in the sigmoid colon.  December 2021 EGD - No gross lesions in esophagus. Z-line irregular, 40 cm from the incisors. - Gastritis. No other gross lesions in the stomach. Biopsied. - No gross lesions in the duodenal bulb, in the first portion of the duodenum and in the second portion of the duodenum. Biopsied.  Pathology FINAL MICROSCOPIC DIAGNOSIS:  A. DUODENUM, BIOPSY:  - Mild increase in intraepithelial lymphocytes, see comment.  - No dysplasia or malignancy.  B. STOMACH, BIOPSY:  - Chronic active gastritis with Helicobacter pylori.  - Warthin-Starry is positive for Helicobacter pylori.  - No intestinal metaplasia, dysplasia, or malignancy.  C. COLON POLYP, CECAL, EMR:  - Multiple fragments of tubulovillous adenoma with high grade dysplasia.  D. COLON, MULTIPLE, POLYPECTOMY:  - Multiple fragments of tubular adenoma and  hyperplastic polyp.  - No high grade dysplasia or malignancy.  E. RECTAL POLYP, EMR:  - Multiple fragments of tubulovillous adenoma.  - No high grade dysplasia or malignancy.  COMMENT:  A. There are  increased intraepithelial lymphocytes, but no villous  blunting. These findings are likely related to the H. pylori infection  seen in part B.   Laboratory Studies  Reviewed those in epic  Imaging Studies  No new imaging studies to review   ASSESSMENT  Mr. Moncus is a 65 y.o. male with a pmh significant for status post CVA (no longer on Plavix), chronic renal insufficiency, peripheral arterial disease, prior prostate cancer, diabetes, diverticulosis, prior colon polyps (large cecal and rectal polyp status post EMRs), prior stercoral ulceration, H. pylori infection (status post treatment unclear eradication), IDA.  The patient is seen today for evaluation and management of:  1. Cecal polyp   2. Hx of adenomatous colonic polyps   3. Unintentional weight loss   4. Diarrhea, unspecified type   5. H. pylori infection    The patient is hemodynamically stable.  Clinically, there has been evidence of some unintentional weight loss though the etiology of this remains unclear.  They are working with his PCP to help with a pmh significant for status post CVA (no longer on Plavix), chronic renal insufficiency, peripheral arterial disease, prior prostate cancer, diabetes, diverticulosis, prior colon polyps (large cecal and rectal polyp status post EMRs), prior stercoral ulceration, H. pylori infection (status post treatment unclear eradication), IDA.  This.  I think repeat endoscopic evaluation from above and below is required for further work-up of this in the history of prior H. pylori infection we should ensure that he has had eradication.  As large as his cecal polyp was and with previous resection years ago the likelihood of a recurrence remains high.  We need to reevaluate the area and ensure that  things have healed and not recurred.  We may end up needing to use some techniques of advanced polypectomy/resection including endoscopic mucosal resection, OVESCO FTR D, Endorotor morcellation, and tissue ablation via fulguration.  We rediscussed the risks and benefits of endoscopic evaluation were discussed with the patient; these include but are not limited to the risk of perforation, infection, bleeding, missed lesions, lack of diagnosis, severe illness requiring hospitalization, as well as anesthesia and sedation related illnesses.  During attempts at advanced resection, the risks of bleeding and perforation/leak are increased as opposed to diagnostic and screening procedures, and that was discussed with the patient as well.   We rediscussed the need for subsequent short-interval endoscopic evaluation for follow up and potential retreatment of the lesion/area may be necessary.  If, after repeat evaluation and potential repeat attempt at removal of the polyp/lesion, it is found that the patient has a complication or that an invasive lesion or malignant lesion is found, or that the polyp/lesion continues to recur, the patient is aware and understands that surgery may still be indicated/required.  I am going to offer the patient 1 more rescheduling for colonoscopy plus EGD.  If they do not make this appointment, I will have to release the patient from the care of the [place] service as 3 delayed cancellations are not acceptable and he will need to follow-up with another provider.  I do not want to do this but it may be required.  The patient's daughter-in-law agrees with this plan of action and will get the patient to follow-up and ensure follow-up with [facility].  All patient questions were answered to the best of my ability, and the patient agrees to the aforementioned plan of action with follow-up as indicated.   PLAN  Preprocedure labs to be obtained Additional labs to work-up of  unintentional weight loss  and diarrhea outlined below Stool studies outlined below Proceed with rescheduling colonoscopy with EMR for follow-up of large polyp resections Proceed with rescheduling EGD for H. pylori follow-up tensional weight loss Consider CT abdomen/pelvis if weight loss persists If patient has another late cancellation or no-show for procedure we will need to move forward with dismissal from my practice and final [place] dismissal will be up to his primary gastroenterologist Dr. Loletha Carrow (we hope that this will not occur)   Orders Placed This Encounter  Procedures   Procedural/ Surgical Case Request: COLONOSCOPY WITH PROPOFOL, ENDOSCOPIC MUCOSAL RESECTION   Ova and parasite examination   CBC   TSH   Sedimentation rate   C-reactive protein   INR/PT   Gastrointestinal Pathogen Panel PCR   Fecal fat, qualitative   Pancreatic elastase, fecal   Ambulatory referral to Gastroenterology    New Prescriptions   No medications on file   Modified Medications   No medications on file    Planned Follow Up No follow-ups on file.   Total Time in Face-to-Face and in Coordination of Care for patient including independent/personal interpretation/review of prior testing, medical history, examination, medication adjustment, communicating results with the patient directly and with the patient's daughter-in-law, and documentation with the EHR is 30 minutes.   Justice Britain, MD [facility] Office # [phone]

## 2020-12-12 DIAGNOSIS — A048 Other specified bacterial intestinal infections: Secondary | ICD-10-CM | POA: Insufficient documentation

## 2020-12-12 DIAGNOSIS — R197 Diarrhea, unspecified: Secondary | ICD-10-CM | POA: Insufficient documentation

## 2020-12-12 DIAGNOSIS — R634 Abnormal weight loss: Secondary | ICD-10-CM | POA: Insufficient documentation

## 2020-12-15 ENCOUNTER — Telehealth: Payer: Self-pay

## 2020-12-15 DIAGNOSIS — K635 Polyp of colon: Secondary | ICD-10-CM

## 2020-12-15 DIAGNOSIS — R634 Abnormal weight loss: Secondary | ICD-10-CM

## 2020-12-15 DIAGNOSIS — Z86010 Personal history of colonic polyps: Secondary | ICD-10-CM

## 2020-12-15 DIAGNOSIS — Z8719 Personal history of other diseases of the digestive system: Secondary | ICD-10-CM

## 2020-12-15 DIAGNOSIS — Z862 Personal history of diseases of the blood and blood-forming organs and certain disorders involving the immune mechanism: Secondary | ICD-10-CM

## 2020-12-15 DIAGNOSIS — Z8619 Personal history of other infectious and parasitic diseases: Secondary | ICD-10-CM

## 2020-12-15 DIAGNOSIS — R197 Diarrhea, unspecified: Secondary | ICD-10-CM

## 2020-12-15 NOTE — Telephone Encounter (Signed)
-----   Message from Irving Copas., MD sent at 12/11/2020  1:02 PM EST ----- Regarding: EGD add on Billy Torres, Please add on an EGD for this patient.  History of H. Pylori. No additional time is required for his procedure slot in the hospital. Thanks. GM

## 2020-12-15 NOTE — Telephone Encounter (Signed)
EGD added to procedures on 02/20/11 at [facility].

## 2020-12-29 ENCOUNTER — Emergency Department (HOSPITAL_COMMUNITY)
Admission: EM | Admit: 2020-12-29 | Discharge: 2020-12-30 | Disposition: A | Discharge status: Home / Self Care | Payer: Medicare (Managed Care) | Attending: Emergency Medicine | Admitting: Emergency Medicine

## 2020-12-29 ENCOUNTER — Other Ambulatory Visit: Payer: Self-pay

## 2020-12-29 ENCOUNTER — Emergency Department (HOSPITAL_COMMUNITY): Payer: Medicare (Managed Care)

## 2020-12-29 DIAGNOSIS — Z794 Long term (current) use of insulin: Secondary | ICD-10-CM | POA: Diagnosis not present

## 2020-12-29 DIAGNOSIS — N189 Chronic kidney disease, unspecified: Secondary | ICD-10-CM | POA: Diagnosis not present

## 2020-12-29 DIAGNOSIS — I129 Hypertensive chronic kidney disease with stage 1 through stage 4 chronic kidney disease, or unspecified chronic kidney disease: Secondary | ICD-10-CM | POA: Insufficient documentation

## 2020-12-29 DIAGNOSIS — Z8673 Personal history of transient ischemic attack (TIA), and cerebral infarction without residual deficits: Secondary | ICD-10-CM | POA: Insufficient documentation

## 2020-12-29 DIAGNOSIS — U071 COVID-19: Secondary | ICD-10-CM | POA: Insufficient documentation

## 2020-12-29 DIAGNOSIS — Z7984 Long term (current) use of oral hypoglycemic drugs: Secondary | ICD-10-CM | POA: Diagnosis not present

## 2020-12-29 DIAGNOSIS — F0390 Unspecified dementia without behavioral disturbance: Secondary | ICD-10-CM | POA: Diagnosis not present

## 2020-12-29 DIAGNOSIS — E1122 Type 2 diabetes mellitus with diabetic chronic kidney disease: Secondary | ICD-10-CM | POA: Insufficient documentation

## 2020-12-29 DIAGNOSIS — Z87891 Personal history of nicotine dependence: Secondary | ICD-10-CM | POA: Insufficient documentation

## 2020-12-29 DIAGNOSIS — R059 Cough, unspecified: Secondary | ICD-10-CM | POA: Diagnosis present

## 2020-12-29 LAB — RESP PANEL BY RT-PCR (FLU A&B, COVID) ARPGX2
Influenza A by PCR: NEGATIVE
Influenza B by PCR: NEGATIVE
SARS Coronavirus 2 by RT PCR: POSITIVE — AB

## 2020-12-29 NOTE — ED Triage Notes (Signed)
Pt here from home requesting antibiotics. Pt was dx w/ covid and pneumonia, but was unable to get his antibiotics so pt's wife wanting him to come here to get them. L BKA, 75HR, 98% RA, 160/90, CBG 117

## 2020-12-29 NOTE — ED Provider Notes (Signed)
Emergency Medicine Provider Triage Evaluation Note  Billy Torres , a 65 y.o. male  was evaluated in triage.  Pt complains of change in breathing, was recently diagnosed with covid. Was told by his wife to come for further evaluation. Hx tobacco use. No COPD. Denies chest pain.  Review of Systems  Positive: sob Negative: Chest pain  Physical Exam  BP (!) 161/95 (BP Location: Right Arm)    Pulse 78    Temp 97.7 F (36.5 C) (Oral)    Resp 20    SpO2 98%  Gen:   Awake, no distress   Resp:  Normal effort  MSK:   Moves extremities without difficulty  Other:  Rhonchi noted bilaterally, most clear with cough  Medical Decision Making  Medically screening exam initiated at 7:29 PM.  Appropriate orders placed.  Billy Torres was informed that the remainder of the evaluation will be completed by another provider, this initial triage assessment does not replace that evaluation, and the importance of remaining in the ED until their evaluation is complete.  Covid eval   Dorien Torres 12/29/20 1930    Billy Chapel, MD 12/29/20 [phone]

## 2020-12-30 LAB — BASIC METABOLIC PANEL
Anion gap: 10 (ref 5–15)
BUN: 10 mg/dL (ref 8–23)
CO2: 24 mmol/L (ref 22–32)
Calcium: 8.9 mg/dL (ref 8.9–10.3)
Chloride: 105 mmol/L (ref 98–111)
Creatinine, Ser: 0.97 mg/dL (ref 0.61–1.24)
GFR, Estimated: >60 mL/min (ref >60)
Glucose, Bld: 82 mg/dL (ref 70–99)
Potassium: 4 mmol/L (ref 3.5–5.1)
Sodium: 139 mmol/L (ref 135–145)

## 2020-12-30 LAB — CBC WITH DIFFERENTIAL/PLATELET
Abs Immature Granulocytes: 0.02 10*3/uL (ref 0.00–0.07)
Basophils Absolute: 0 10*3/uL (ref 0.0–0.1)
Basophils Relative: 1 %
Eosinophils Absolute: 0.1 10*3/uL (ref 0.0–0.5)
Eosinophils Relative: 1 %
HCT: 45 % (ref 39.0–52.0)
Hemoglobin: 14 g/dL (ref 13.0–17.0)
Immature Granulocytes: 0 %
Lymphocytes Relative: 33 %
Lymphs Abs: 2.1 10*3/uL (ref 0.7–4.0)
MCH: 27.1 pg (ref 26.0–34.0)
MCHC: 31.1 g/dL (ref 30.0–36.0)
MCV: 87 fL (ref 80.0–100.0)
Monocytes Absolute: 0.5 10*3/uL (ref 0.1–1.0)
Monocytes Relative: 7 %
Neutro Abs: 3.8 10*3/uL (ref 1.7–7.7)
Neutrophils Relative %: 58 %
Platelets: 233 10*3/uL (ref 150–400)
RBC: 5.17 MIL/uL (ref 4.22–5.81)
RDW: 13.2 % (ref 11.5–15.5)
WBC: 6.5 10*3/uL (ref 4.0–10.5)
nRBC: 0 % (ref 0.0–0.2)

## 2020-12-30 MED ORDER — NIRMATRELVIR/RITONAVIR (PAXLOVID)TABLET: 3.0000 | ORAL_TABLET | Freq: Two times a day (BID) | ORAL | 0 refills | Status: AC

## 2020-12-30 NOTE — ED Notes (Signed)
Pt discharged and wheeled out of the ED in a wheel chair without difficulty. 

## 2020-12-30 NOTE — Discharge Instructions (Signed)
I spoke with the [facility] team, they mentioned that the Paxlovid prescription may show up later today.  If it does not we have provided you with a paper prescription for this medicine.  You can pick it up at your local pharmacy.  While taking this medicine please do not take your Lipitor or Vardenifil.

## 2020-12-30 NOTE — ED Provider Notes (Signed)
[facility] EMERGENCY DEPARTMENT Provider Note  CSN: [id number] Arrival date & time: 12/29/20 1907  Chief Complaint(s) No chief complaint on file.  HPI Billy Torres is a 65 y.o. male with a past medical history listed below recently diagnosed with COVID-19 by his PCP in the last 2 days who was prescribed Paxlovid was sent here by his wife because he has not received this medicine in the mail yet.  Patient's got a history of cognitive deficits due to prior CVAs.  Poor historian because of this.  Reports that he started having symptoms sometime this week.  Symptoms include nasal congestion and cough.  No chest pain or shortness of breath.  No fevers or chills.  Currently he denies any physical complaints.  HPI  Past Medical History Past Medical History:  Diagnosis Date   Abnormal rate of speech    post stroke   Anticoagulant long-term use    history of plavix   Arthritis of left shoulder region    Chronic constipation    Chronic kidney disease    chronic kidney disease    Cognitive deficit due to old cerebral infarction    DDD (degenerative disc disease), cervical    Degenerative cervical spinal stenosis    Gait disturbance, post-stroke    uses cane/weelchair   History of ischemic left MCA stroke 06/27/2013   w/ evidence hemorrhagia, high grade stenosis bilateral proximal MCA and extensive small matter disease--- symptoms resolved, no residual    History of ischemic stroke without residual deficits 05-14-2015---  residual left hemiparesis and cognitive deficits   small nonhemorrhegic infarct right corona radiata posterior limb and progressive right m2 stenosis   Hyperlipidemia    Hypertension    Insomnia    Internal hemorrhoids    Left hemiparesis (HCC)    RESIDUAL FROM STROKE--  WALKS W/ CANE   Lower GI bleed    Mild atherosclerosis of carotid artery, right    RICA 0-39% per duplex 06-28-2013   Neuromuscular disorder (HCC)    neuropathy - feet   OA  (osteoarthritis)    LEFT SHOULDER   Painful bladder spasm    Peripheral arterial disease ([place])    Prostate cancer [facility]) urologist-  dr Gaynelle Arabian  oncologist-  dr [name]   dx 12/ 2017-- Stage T1c, Gleason 3+4,  PSA 10.4,  vol 51cc   Sensorineural hearing loss, bilateral    does not wear hearing aids   Stroke [facility])    Type 2 diabetes mellitus treated with insulin ([place])    Urge incontinence    w frequency, wears depends   Urinary retention    Previously required indwelling Foley   Wears dentures    Patient Active Problem List   Diagnosis Date Noted   Unintentional weight loss 12/12/2020   Diarrhea 12/12/2020   H. pylori infection 12/12/2020   Cecal polyp 08/15/2019   History of rectal ulcer 08/15/2019   History of anemia 08/15/2019   History of colonic polyps 08/15/2019   Acute blood loss anemia 05/25/2019   Hematochezia 05/25/2019   Aortic atherosclerosis ([place]) 05/25/2019   Pressure injury of skin 05/21/2019   Acquired absence of left leg below knee ([place]) 05/05/2019   Acute osteomyelitis of left foot ([place]) 04/28/2019   Cutaneous abscess of left foot    PVD (peripheral vascular disease) ([place]) 12/11/2018   Hydronephrosis due to obstruction of bladder    Acute urinary retention 08/15/2016   Malignant neoplasm of prostate ([place]) 02/26/2016   Muscle spasticity  Dementia ([place])    Cerebral infarction due to unspecified mechanism    Leukocytosis    Acute thrombotic stroke ([place]) 05/17/2015   Diabetes mellitus due to underlying condition with complication, without long-term current use of insulin (HCC)    Gait disturbance, post-stroke    Hemiparesis (HCC)    Cognitive deficit due to old cerebral infarction    Benign essential HTN    Diabetes mellitus type 2 in nonobese [facility])    Essential hypertension    History of CVA (cerebrovascular accident) without residual deficits    Ataxia, post-stroke    Dysarthria, post-stroke    Tobacco abuse    HLD (hyperlipidemia)    Acute CVA  (cerebrovascular accident) ([place]) 05/15/2015   Left-sided weakness 05/14/2015   Right shoulder pain 03/03/2014   History of CVA (cerebrovascular accident) 02/28/2014   Smoking 06/29/2013   CVA (cerebral infarction) 06/29/2013   Dyslipidemia 06/29/2013   TIA (transient ischemic attack) 06/27/2013   Diabetes ([place]) 06/27/2013   Home Medication(s) Prior to Admission medications   Medication Sig Start Date End Date Taking? Authorizing Provider  nirmatrelvir/ritonavir EUA (PAXLOVID) 20 x 150 MG & 10 x 100MG  TABS Take 3 tablets by mouth 2 (two) times daily for 5 days. Patient GFR is >60. Take nirmatrelvir (150 mg) two tablets twice daily for 5 days and ritonavir (100 mg) one tablet twice daily for 5 days. Please withhold Lipitor (atorvastatin) for the 5 days. 12/30/20 01/04/21 Yes Eleuterio Dollar, Grayce Sessions, MD  acetaminophen (TYLENOL) 500 MG tablet Take 1,000 mg by mouth 3 (three) times daily as needed (pain).    [provider]  alfuzosin (UROXATRAL) 10 MG 24 hr tablet Take 10 mg by mouth daily with breakfast.    [provider]  atorvastatin (LIPITOR) 40 MG tablet Take 40 mg by mouth at bedtime.    [provider]  cetirizine (ZYRTEC) 10 MG tablet Take 10 mg by mouth 3 times/day as needed-between meals & bedtime for allergies.    [provider]  Cholecalciferol (VITAMIN D3) 50 MCG (2000 UT) TABS Take 2,000 Units by mouth in the morning.    [provider]  donepezil (ARICEPT) 10 MG tablet Take 10 mg by mouth at bedtime.    [provider]  Dulaglutide (TRULICITY) 2.69 SW/5.4OE SOPN Inject 0.75 mg into the skin once a week. Takes on Tuesday    [provider]  gabapentin (NEURONTIN) 600 MG tablet Take 600 mg by mouth in the morning and at bedtime.    [provider]  melatonin 5 MG TABS Take 5 mg by mouth at bedtime.    [provider]  metFORMIN (GLUCOPHAGE) 500 MG tablet Take 500 mg by mouth 2 (two) times daily with a  meal.     [provider]  mineral oil-hydrophilic petrolatum (AQUAPHOR) ointment Apply 1 application topically See admin instructions. Apply twice daily for right heel and as needed for dry skin    [provider]  omeprazole (PRILOSEC) 40 MG capsule Take 1 capsule (40 mg total) by mouth daily. 12/20/19 12/19/20  Mansouraty, Telford Nab., MD  polyethylene glycol (MIRALAX / GLYCOLAX) 17 g packet Take 17 g by mouth daily as needed (constipation.).    [provider]  senna-docusate (SENOKOT-S) 8.6-50 MG tablet Take 2 tablets by mouth at bedtime as needed for mild constipation.    [provider]  simethicone (MYLICON) 80 MG chewable tablet Chew 80 mg by mouth 4 (four) times daily as needed for flatulence.  [provider]                                                                                                                                    Past Surgical History Past Surgical History:  Procedure Laterality Date   ABDOMINAL AORTOGRAM W/LOWER EXTREMITY N/A 12/11/2018   Procedure: ABDOMINAL AORTOGRAM W/LOWER EXTREMITY;  Surgeon: Angelia Mould, MD;  Location: [place] [facility];  Service: Cardiovascular;  Laterality: N/A;   ABDOMINAL AORTOGRAM W/LOWER EXTREMITY Bilateral 04/02/2019   Procedure: ABDOMINAL AORTOGRAM W/LOWER EXTREMITY;  Surgeon: Angelia Mould, MD;  Location: [place] [facility];  Service: Cardiovascular;  Laterality: Bilateral;   AMPUTATION Left 04/28/2019   Procedure: LEFT BELOW KNEE AMPUTATION;  Surgeon: Newt Minion, MD;  Location: [place];  Service: Orthopedics;  Laterality: Left;   ANTERIOR CERVICAL DECOMP/DISCECTOMY FUSION  11/27/1999   C5 -- C6   BELOW KNEE LEG AMPUTATION Left 04/28/2019   BIOPSY  05/21/2019   Procedure: BIOPSY;  Surgeon: Doran Stabler, MD;  Location: [place];  Service: Gastroenterology;;   BIOPSY  12/20/2019   Procedure: BIOPSY;  Surgeon: Irving Copas., MD;  Location: [place];  Service: Gastroenterology;;   CIRCUMCISION  10/07/2001   COLONOSCOPY WITH PROPOFOL N/A 05/21/2019   Procedure: COLONOSCOPY WITH PROPOFOL;  Surgeon: Doran Stabler, MD;  Location: [place];  Service: Gastroenterology;  Laterality: N/A;   COLONOSCOPY WITH PROPOFOL N/A 12/20/2019   Procedure: COLONOSCOPY WITH PROPOFOL;  Surgeon: Rush Landmark Telford Nab., MD;  Location: [place];  Service: Gastroenterology;  Laterality: N/A;   CYSTOSCOPY WITH INSERTION OF UROLIFT N/A 08/11/2017   Procedure: CYSTOSCOPY WITH INSERTION OF UROLIFT;  Surgeon: Cleon Gustin, MD;  Location: [facility];  Service: Urology;  Laterality: N/A;   ENDOSCOPIC MUCOSAL RESECTION N/A 12/20/2019   Procedure: ENDOSCOPIC MUCOSAL RESECTION;  Surgeon: Rush Landmark Telford Nab., MD;  Location: [place];  Service: Gastroenterology;  Laterality: N/A;   ESOPHAGOGASTRODUODENOSCOPY (EGD) WITH PROPOFOL N/A 12/20/2019   Procedure: ESOPHAGOGASTRODUODENOSCOPY (EGD) WITH PROPOFOL;  Surgeon: Rush Landmark Telford Nab., MD;  Location: [place];  Service: Gastroenterology;  Laterality: N/A;   FLEXIBLE SIGMOIDOSCOPY N/A 05/23/2019   Procedure: FLEXIBLE SIGMOIDOSCOPY;  Surgeon: Doran Stabler, MD;  Location: [place];  Service: Gastroenterology;  Laterality: N/A;   FLEXIBLE SIGMOIDOSCOPY N/A 05/26/2019   Procedure: FLEXIBLE SIGMOIDOSCOPY;  Surgeon: Milus Banister, MD;  Location: [facility]OSCOPY;  Service: Endoscopy;  Laterality: N/A;   FOOT SURGERY  2005   HEMOSTASIS CLIP PLACEMENT  05/23/2019   Procedure: HEMOSTASIS CLIP PLACEMENT;  Surgeon: Doran Stabler, MD;  Location: [facility];  Service: Gastroenterology;;   HEMOSTASIS CLIP PLACEMENT  05/26/2019   Procedure: HEMOSTASIS CLIP PLACEMENT;  Surgeon: Milus Banister, MD;  Location: [facility];  Service: Endoscopy;;   HEMOSTASIS CLIP PLACEMENT  12/20/2019   Procedure: HEMOSTASIS CLIP PLACEMENT;  Surgeon: Irving Copas., MD;  Location: [place];  Service:  Gastroenterology;;   PERIPHERAL VASCULAR BALLOON ANGIOPLASTY  12/11/2018   Procedure: PERIPHERAL VASCULAR BALLOON ANGIOPLASTY;  Surgeon: Angelia Mould, MD;  Location: [place] [facility];  Service: Cardiovascular;;  Lt. SFA   POLYPECTOMY  12/20/2019   Procedure: POLYPECTOMY;  Surgeon: Rush Landmark Telford Nab., MD;  Location: [place];  Service: Gastroenterology;;   PROSTATE BIOPSY  12/12/2015   RADIOACTIVE SEED IMPLANT N/A 05/30/2016   Procedure: RADIOACTIVE SEED IMPLANT/BRACHYTHERAPY IMPLANT WITH SPACE OAR;  Surgeon: Carolan Clines, MD;  Location: [facility];  Service: Urology;  Laterality: N/A;   SHOULDER ARTHROSCOPY WITH SUBACROMIAL DECOMPRESSION Left 11/28/2006   Debridement/ Acromioplasty/  CA ligament relesase   SUBMUCOSAL LIFTING INJECTION  12/20/2019   Procedure: SUBMUCOSAL LIFTING INJECTION;  Surgeon: Irving Copas., MD;  Location: [place];  Service: Gastroenterology;;   TRANSTHORACIC ECHOCARDIOGRAM  05/15/2015   mild LVH,  ef 65-70%, codominant mitral inflow-iindeterminate diastolic function/     Family History Family History  Problem Relation Age of Onset   Hypertension Mother    Hypertension Father    Cancer Brother        prostate    Social History Social History   Tobacco Use   Smoking status: Former    Packs/day: 0.25    Years: 45.00    Pack years: 11.25    Types: Cigarettes   Smokeless tobacco: Never  Vaping Use   Vaping Use: Never used  Substance Use Topics   Alcohol use: No   Drug use: No   Allergies Patient has no known allergies.  Review of Systems Review of Systems All other systems are reviewed and are negative for acute change except as noted in the HPI  Physical Exam Vital Signs  I have reviewed the triage vital signs BP (!) 163/86 (BP Location: Right Arm)    Pulse 73    Temp 98 F (36.7 C) (Oral)    Resp 18    SpO2 95%   Physical Exam Vitals reviewed.  Constitutional:      General: He is not  in acute distress.    Appearance: He is well-developed. He is not diaphoretic.  HENT:     Head: Normocephalic and atraumatic.     Nose: Nose normal.  Eyes:     General: No scleral icterus.       Right eye: No discharge.        Left eye: No discharge.     Conjunctiva/sclera: Conjunctivae normal.     Pupils: Pupils are equal, round, and reactive to light.  Cardiovascular:     Rate and Rhythm: Normal rate and regular rhythm.     Heart sounds: No murmur heard.   No friction rub. No gallop.  Pulmonary:     Effort: Pulmonary effort is normal. No respiratory distress.     Breath sounds: Normal breath sounds. No stridor. No rales.  Abdominal:     General: There is no distension.     Palpations: Abdomen is soft.     Tenderness: There is no abdominal tenderness.  Musculoskeletal:        General: No tenderness.     Cervical back: Normal range of motion and neck supple.     Comments: Left BKA  Skin:    General: Skin is warm and dry.     Findings: No erythema or rash.  Neurological:     Mental Status: He is alert and oriented to person, place, and time.    ED Results and Treatments Labs (  all labs ordered are listed, but only abnormal results are displayed) Labs Reviewed  RESP PANEL BY RT-PCR (FLU A&B, COVID) ARPGX2 - Abnormal; Notable for the following components:      Result Value   SARS Coronavirus 2 by RT PCR POSITIVE (*)    All other components within normal limits  CBC WITH DIFFERENTIAL/PLATELET  BASIC METABOLIC PANEL                                                                                                                         EKG  EKG Interpretation  Date/Time:    Ventricular Rate:    PR Interval:    QRS Duration:   QT Interval:    QTC Calculation:   R Axis:     Text Interpretation:         Radiology DG Chest 2 View  Result Date: 12/29/2020 CLINICAL DATA:  Shortness of breath. EXAM: CHEST - 2 VIEW COMPARISON:  Chest radiograph dated 05/20/2019.  FINDINGS: The heart size and mediastinal contours are within normal limits. Both lungs are clear. The visualized skeletal structures are unremarkable. IMPRESSION: No active cardiopulmonary disease. Electronically Signed   By: Anner Crete M.D.   On: 12/29/2020 20:13    Pertinent labs & imaging results that were available during my care of the patient were reviewed by me and considered in my medical decision making (see MDM for details).  Medications Ordered in ED Medications - No data to display                                                                                                                                   Procedures Procedures  (including critical care time)  Medical Decision Making / ED Course I have reviewed the nursing notes for this encounter and the patient's prior records (if available in EHR or on provided paperwork).  Billy Torres was evaluated in Emergency Department on 12/30/2020 for the symptoms described in the history of present illness. He was evaluated in the context of the global COVID-19 pandemic, which necessitated consideration that the patient might be at risk for infection with the SARS-CoV-2 virus that causes COVID-19. Institutional protocols and algorithms that pertain to the evaluation of patients at risk for COVID-19 are in a state of rapid change based on information released by regulatory bodies including the CDC and federal and state organizations. These policies and algorithms were  followed during the patient's care in the ED.     Confirmed positive COVID. Patient is well-appearing, well-hydrated.  Nontoxic-appearing . labs reassuring Chest x-ray without pneumonia.  I spoke with the on-call PACE physician who remembered patient was diagnosed with COVID-19 and prescribed Paxlovid.  She mentioned that the medicine may arrive today.  Pertinent labs & imaging results that were available during my care of the patient were reviewed by me and  considered in my medical decision making:  Will provide patient with a paper prescription in case medicine does not arrive. I called and spoke to the patient's daughter-in-law to inform her of update.   Final Clinical Impression(s) / ED Diagnoses Final diagnoses:  HQION-62 virus infection   The patient appears reasonably screened and/or stabilized for discharge and I doubt any other medical condition or other [facility] requiring further screening, evaluation, or treatment in the ED at this time prior to discharge. Safe for discharge with strict return precautions.  Disposition: Discharge  Condition: Good  I have discussed the results, Dx and Tx plan with the patient/family who expressed understanding and agree(s) with the plan. Discharge instructions discussed at length. The patient/family was given strict return precautions who verbalized understanding of the instructions. No further questions at time of discharge.    ED Discharge Orders          Ordered    nirmatrelvir/ritonavir EUA (PAXLOVID) 20 x 150 MG & 10 x 100MG  TABS  2 times daily        12/30/20 9528             Follow Up: Primary care provider  Call  as needed     This chart was dictated using voice recognition software.  Despite best efforts to proofread,  errors can occur which can change the documentation meaning.    Fatima Blank, MD 12/30/20 [phone]

## 2021-01-17 IMAGING — DX DG HAND COMPLETE 3+V*L*
3 series · 3 of 3 positions shown · non-contrast
Comparison: 02/22/2016

CLINICAL DATA: Left lateral hand pain for 3 weeks.

EXAM:
LEFT HAND - COMPLETE 3+ VIEW

[dg hand complete left (1 of 3)]
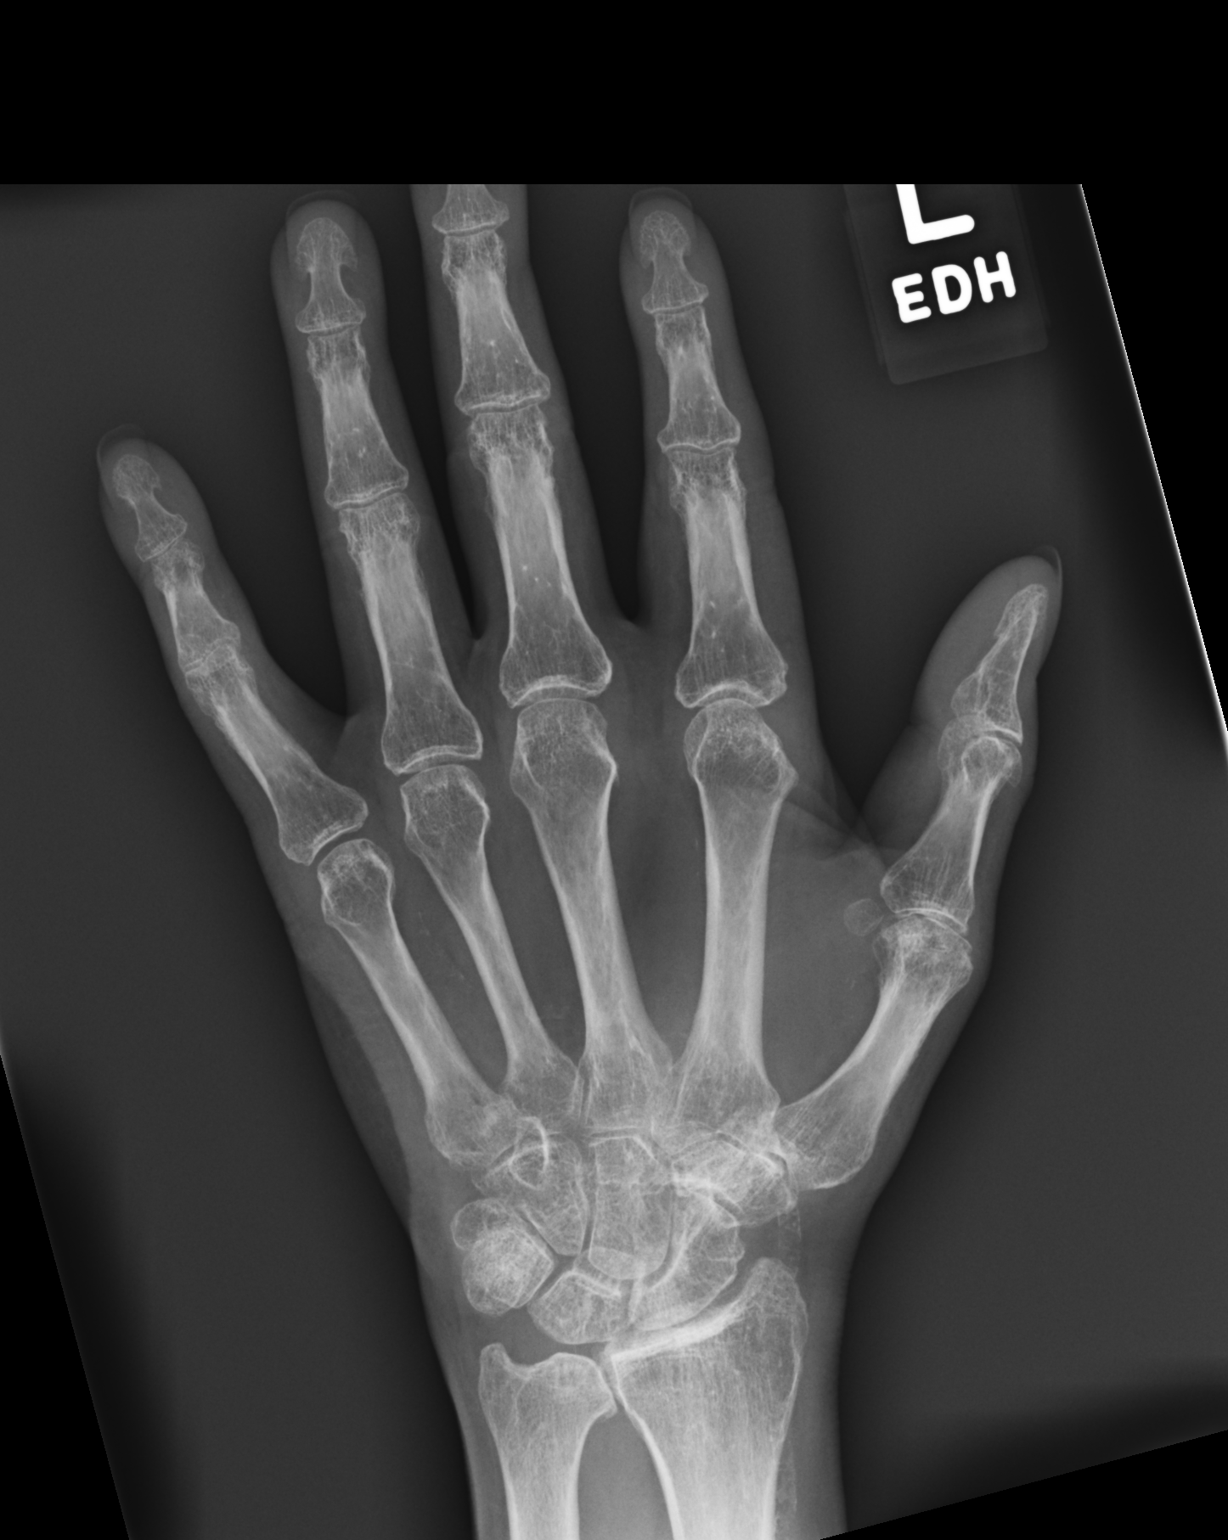

[dg hand complete left (2 of 3)]
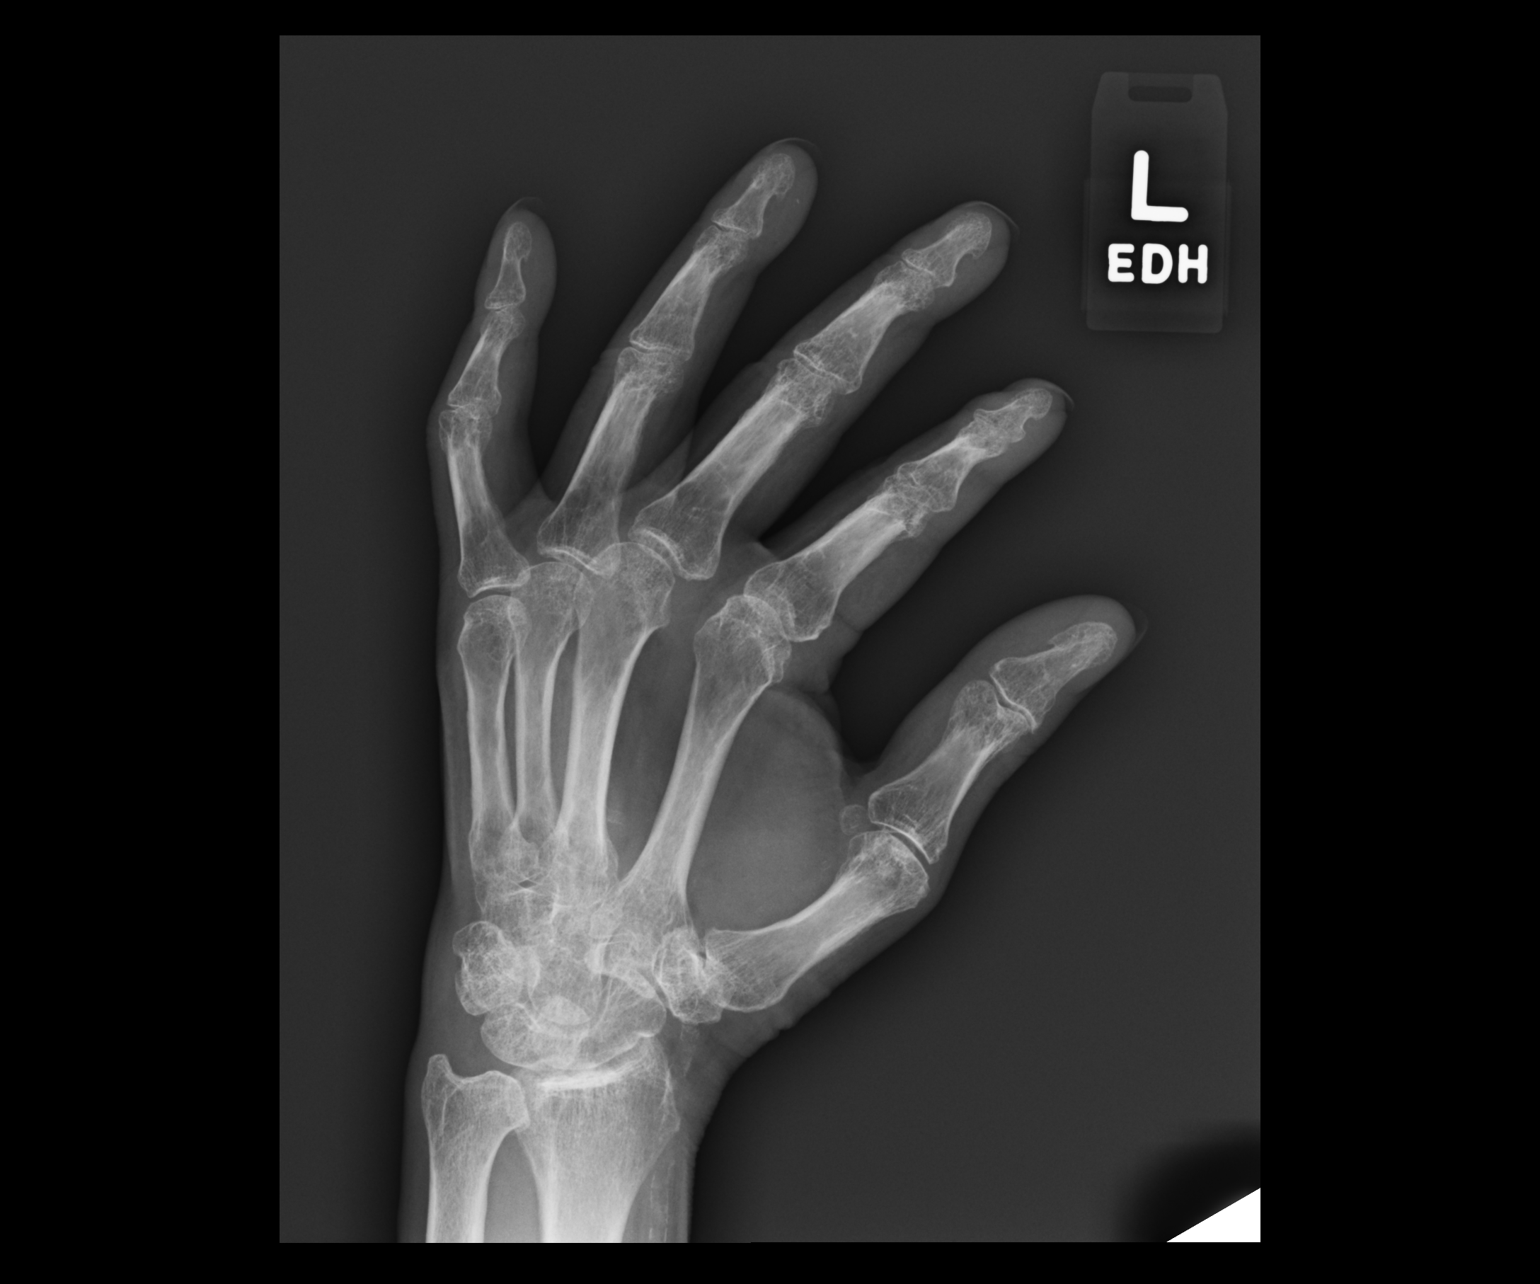

[dg hand complete left (3 of 3)]
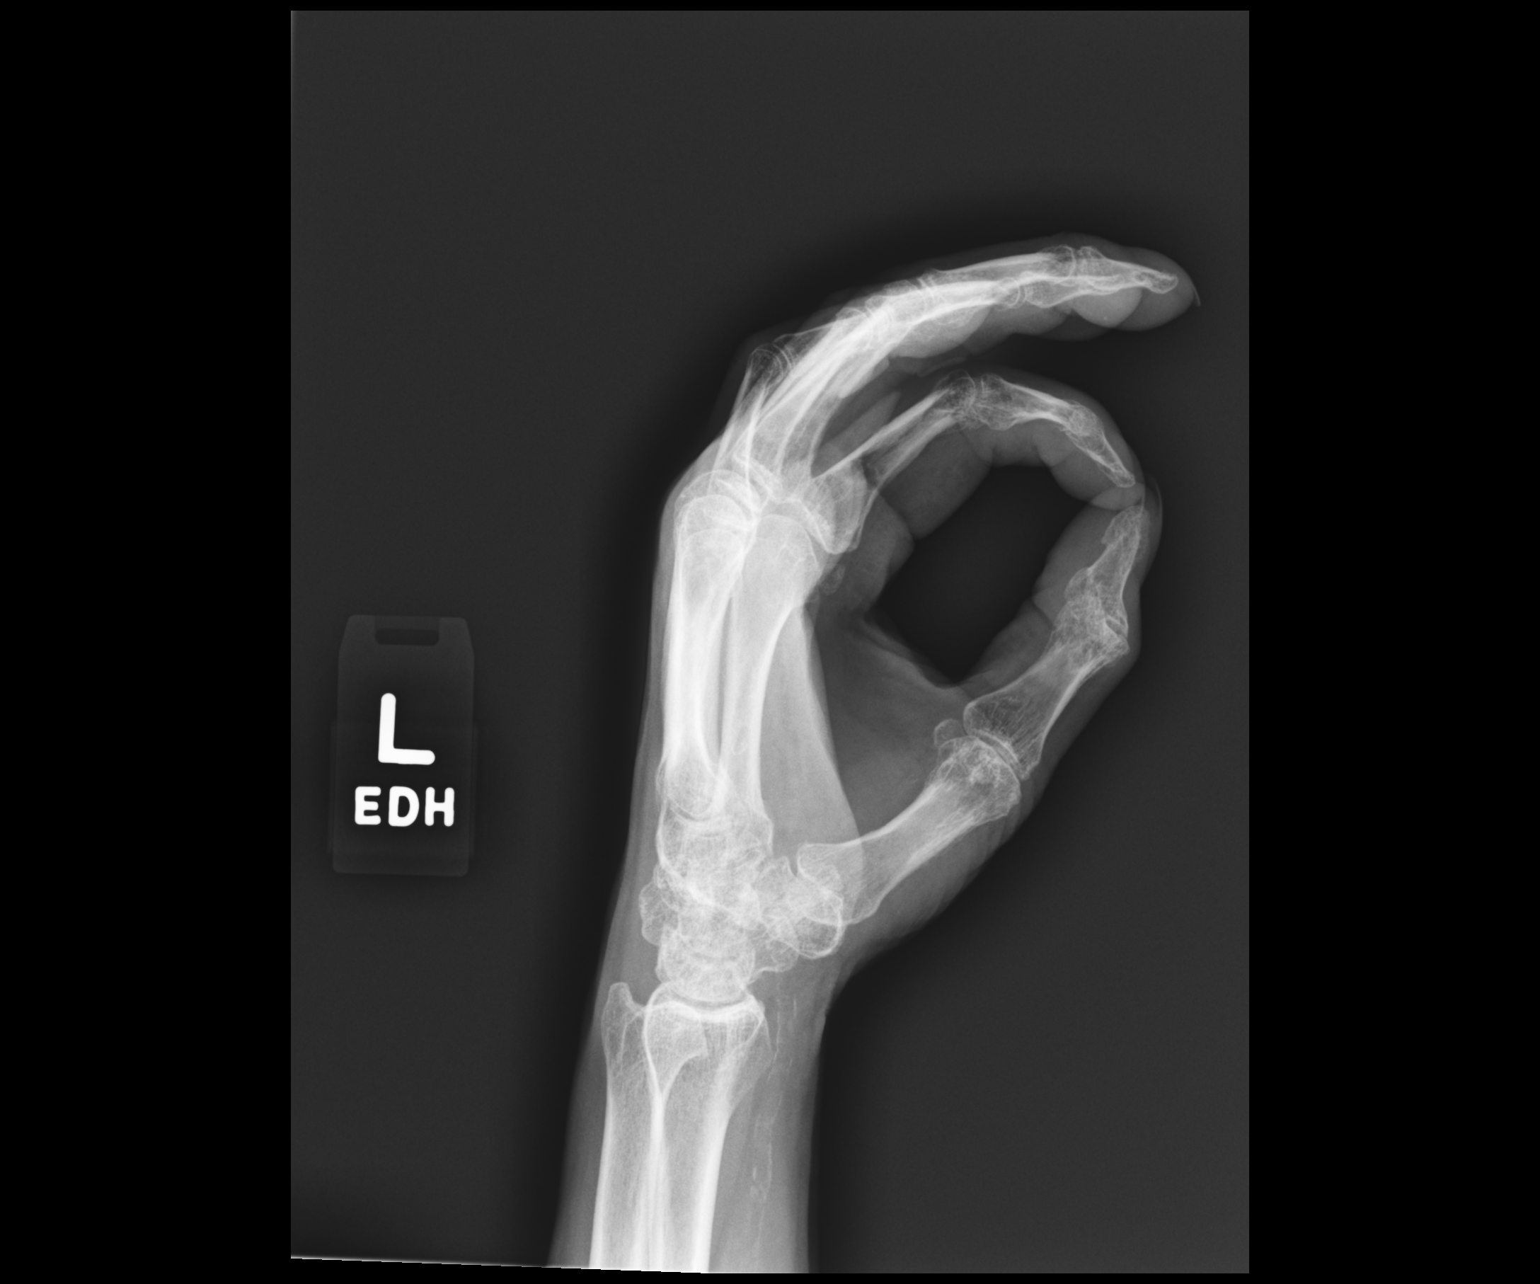

[3 of 3 positions shown; findings below may reference images not displayed]

FINDINGS: There is no evidence of fracture or dislocation. Diffuse
degenerative changes of the interphalangeal joints, intercarpal
joints and radiocarpal joint. Vascular calcifications are noted. No
gross soft tissue swelling.
IMPRESSION: No acute fracture or dislocation identified about the left hand.

Diffuse degenerative changes of the interphalangeal, intercarpal and
radiocarpal joints.

## 2021-02-09 ENCOUNTER — Encounter (HOSPITAL_COMMUNITY): Payer: Self-pay | Admitting: Gastroenterology

## 2021-02-09 NOTE — Progress Notes (Signed)
Attempted to obtain medical history via telephone, unable to reach at this time. Unable to leave voicemail to return pre surgical testing department's phone call,due to mailbox not set up.   

## 2021-02-19 ENCOUNTER — Ambulatory Visit (HOSPITAL_COMMUNITY)
Admission: RE | Admit: 2021-02-19 | Discharge: 2021-02-19 | Disposition: A | Discharge status: Home / Self Care | Payer: Medicare (Managed Care) | Source: Ambulatory Visit | Attending: Gastroenterology | Admitting: Gastroenterology

## 2021-02-19 ENCOUNTER — Encounter (HOSPITAL_COMMUNITY): Payer: Self-pay | Admitting: Gastroenterology

## 2021-02-19 ENCOUNTER — Encounter (HOSPITAL_COMMUNITY): Payer: Medicare (Managed Care) | Admitting: Anesthesiology

## 2021-02-19 ENCOUNTER — Encounter (HOSPITAL_COMMUNITY): Payer: Self-pay | Admitting: Anesthesiology

## 2021-02-19 ENCOUNTER — Ambulatory Visit (HOSPITAL_COMMUNITY)
Admission: RE | Admit: 2021-02-19 | Payer: Medicare (Managed Care) | Source: Ambulatory Visit | Admitting: Gastroenterology

## 2021-02-19 ENCOUNTER — Encounter (HOSPITAL_COMMUNITY)
Admission: RE | Disposition: A | Discharge status: Home / Self Care | Payer: Self-pay | Source: Ambulatory Visit | Attending: Gastroenterology

## 2021-02-19 ENCOUNTER — Other Ambulatory Visit: Payer: Self-pay

## 2021-02-19 DIAGNOSIS — N189 Chronic kidney disease, unspecified: Secondary | ICD-10-CM | POA: Diagnosis not present

## 2021-02-19 DIAGNOSIS — K635 Polyp of colon: Secondary | ICD-10-CM | POA: Diagnosis not present

## 2021-02-19 DIAGNOSIS — K6289 Other specified diseases of anus and rectum: Secondary | ICD-10-CM | POA: Diagnosis not present

## 2021-02-19 DIAGNOSIS — K621 Rectal polyp: Secondary | ICD-10-CM | POA: Insufficient documentation

## 2021-02-19 DIAGNOSIS — I129 Hypertensive chronic kidney disease with stage 1 through stage 4 chronic kidney disease, or unspecified chronic kidney disease: Secondary | ICD-10-CM | POA: Insufficient documentation

## 2021-02-19 DIAGNOSIS — Z9889 Other specified postprocedural states: Secondary | ICD-10-CM | POA: Diagnosis not present

## 2021-02-19 DIAGNOSIS — K2970 Gastritis, unspecified, without bleeding: Secondary | ICD-10-CM

## 2021-02-19 DIAGNOSIS — D509 Iron deficiency anemia, unspecified: Secondary | ICD-10-CM

## 2021-02-19 DIAGNOSIS — Z8673 Personal history of transient ischemic attack (TIA), and cerebral infarction without residual deficits: Secondary | ICD-10-CM | POA: Insufficient documentation

## 2021-02-19 DIAGNOSIS — K641 Second degree hemorrhoids: Secondary | ICD-10-CM | POA: Insufficient documentation

## 2021-02-19 DIAGNOSIS — R197 Diarrhea, unspecified: Secondary | ICD-10-CM | POA: Insufficient documentation

## 2021-02-19 DIAGNOSIS — R634 Abnormal weight loss: Secondary | ICD-10-CM

## 2021-02-19 DIAGNOSIS — D120 Benign neoplasm of cecum: Secondary | ICD-10-CM | POA: Insufficient documentation

## 2021-02-19 DIAGNOSIS — D122 Benign neoplasm of ascending colon: Secondary | ICD-10-CM | POA: Insufficient documentation

## 2021-02-19 DIAGNOSIS — Z86010 Personal history of colonic polyps: Secondary | ICD-10-CM | POA: Insufficient documentation

## 2021-02-19 HISTORY — DX: Cerebral infarction, unspecified: I63.9

## 2021-02-19 HISTORY — PX: ENDOSCOPIC MUCOSAL RESECTION: SHX6839

## 2021-02-19 HISTORY — PX: COLONOSCOPY WITH PROPOFOL: SHX5780

## 2021-02-19 HISTORY — PX: POLYPECTOMY: SHX5525

## 2021-02-19 HISTORY — PX: SUBMUCOSAL LIFTING INJECTION: SHX6855

## 2021-02-19 HISTORY — PX: ESOPHAGOGASTRODUODENOSCOPY: SHX5428

## 2021-02-19 HISTORY — PX: BIOPSY: SHX5522

## 2021-02-19 HISTORY — DX: Myoneural disorder, unspecified: G70.9

## 2021-02-19 HISTORY — PX: HEMOSTASIS CLIP PLACEMENT: SHX6857

## 2021-02-19 LAB — GLUCOSE, CAPILLARY: Glucose-Capillary: 122 mg/dL — ABNORMAL HIGH (ref 70–99)

## 2021-02-19 SURGERY — COLONOSCOPY WITH PROPOFOL
Anesthesia: Monitor Anesthesia Care

## 2021-02-19 MED ORDER — POLYETHYLENE GLYCOL 3350 17 G PO PACK: 17.0000 g | PACK | Freq: Every day | ORAL | 0 refills | Status: AC

## 2021-02-19 MED ORDER — LACTATED RINGERS IV SOLN: INTRAVENOUS | Status: DC | PRN

## 2021-02-19 MED ORDER — EPHEDRINE SULFATE-NACL 50-0.9 MG/10ML-% IV SOSY
PREFILLED_SYRINGE | INTRAVENOUS | Status: DC | PRN
  Administered 2021-02-19 (×2): 10 mg via INTRAVENOUS
  Administered 2021-02-19: 5 mg via INTRAVENOUS

## 2021-02-19 MED ORDER — HYDRALAZINE HCL 20 MG/ML IJ SOLN
INTRAMUSCULAR | Status: AC
  Filled 2021-02-19: qty 1

## 2021-02-19 MED ORDER — PROPOFOL 1000 MG/100ML IV EMUL
INTRAVENOUS | Status: AC
  Filled 2021-02-19: qty 100

## 2021-02-19 MED ORDER — PROPOFOL 10 MG/ML IV BOLUS
INTRAVENOUS | Status: DC | PRN
  Administered 2021-02-19: 20 mg via INTRAVENOUS

## 2021-02-19 MED ORDER — HYDRALAZINE HCL 20 MG/ML IJ SOLN
10.0000 mg | Freq: Once | INTRAMUSCULAR | Status: AC
  Administered 2021-02-19: 10 mg via INTRAVENOUS

## 2021-02-19 MED ORDER — OMEPRAZOLE 40 MG PO CPDR: 40.0000 mg | DELAYED_RELEASE_CAPSULE | Freq: Every day | ORAL | 6 refills | Status: DC

## 2021-02-19 MED ORDER — LIDOCAINE 2% (20 MG/ML) 5 ML SYRINGE
INTRAMUSCULAR | Status: DC | PRN
  Administered 2021-02-19: 100 mg via INTRAVENOUS

## 2021-02-19 MED ORDER — PROPOFOL 500 MG/50ML IV EMUL
INTRAVENOUS | Status: DC | PRN
  Administered 2021-02-19: 130 ug/kg/min via INTRAVENOUS

## 2021-02-19 MED ORDER — SODIUM CHLORIDE 0.9 % IV SOLN: INTRAVENOUS | Status: DC

## 2021-02-19 SURGICAL SUPPLY — 22 items

## 2021-02-19 NOTE — Progress Notes (Signed)
SBP elevated in PACU s/p endoscopies, 180's-203. Dr Kalman Shan notified, new orders received, hydralazine 10mg  iv ordered.

## 2021-02-19 NOTE — Progress Notes (Signed)
BP 136/71, pt feeling well, DC home per Dr Kalman Shan.

## 2021-02-19 NOTE — Discharge Instructions (Signed)

## 2021-02-19 NOTE — H&P (Signed)
GASTROENTEROLOGY PROCEDURE H&P NOTE   Primary Care Physician: Patient, No Pcp Per (Inactive)  HPI: Billy Torres is a 66 y.o. male who presents for EGD/Colonoscopy.  History of Cecal TVA status post piecemeal resection and rectal TVA status post resection in December 2021.  He no showed on 2 occasions and has been rescheduled for today.  EGD for iron deficiency and H. pylori follow-up and colonoscopy for diarrhea and follow-up large polyp.  Needs random colon biopsies as well (elevated inflammatory marker and for diarrhea).  Past Medical History:  Diagnosis Date   Abnormal rate of speech    post stroke   Anticoagulant long-term use    history of plavix   Arthritis of left shoulder region    Chronic constipation    Chronic kidney disease    chronic kidney disease    Cognitive deficit due to old cerebral infarction    DDD (degenerative disc disease), cervical    Degenerative cervical spinal stenosis    Gait disturbance, post-stroke    uses cane/weelchair   History of ischemic left MCA stroke 06/27/2013   w/ evidence hemorrhagia, high grade stenosis bilateral proximal MCA and extensive small matter disease--- symptoms resolved, no residual    History of ischemic stroke without residual deficits 05-14-2015---  residual left hemiparesis and cognitive deficits   small nonhemorrhegic infarct right corona radiata posterior limb and progressive right m2 stenosis   Hyperlipidemia    Hypertension    Insomnia    Internal hemorrhoids    Left hemiparesis (HCC)    RESIDUAL FROM STROKE--  WALKS W/ CANE   Lower GI bleed    Mild atherosclerosis of carotid artery, right    RICA 0-39% per duplex 06-28-2013   Neuromuscular disorder (HCC)    neuropathy - feet   OA (osteoarthritis)    LEFT SHOULDER   Painful bladder spasm    Peripheral arterial disease ([place])    Prostate cancer [facility]) urologist-  dr Gaynelle Arabian  oncologist-  dr [name]   dx 12/ 2017-- Stage T1c, Gleason 3+4,  PSA 10.4,  vol  51cc   Sensorineural hearing loss, bilateral    does not wear hearing aids   Stroke [facility])    Type 2 diabetes mellitus treated with insulin ([place])    Urge incontinence    w frequency, wears depends   Urinary retention    Previously required indwelling Foley   Wears dentures    Past Surgical History:  Procedure Laterality Date   ABDOMINAL AORTOGRAM W/LOWER EXTREMITY N/A 12/11/2018   Procedure: ABDOMINAL AORTOGRAM W/LOWER EXTREMITY;  Surgeon: Angelia Mould, MD;  Location: [place] [facility];  Service: Cardiovascular;  Laterality: N/A;   ABDOMINAL AORTOGRAM W/LOWER EXTREMITY Bilateral 04/02/2019   Procedure: ABDOMINAL AORTOGRAM W/LOWER EXTREMITY;  Surgeon: Angelia Mould, MD;  Location: [place] [facility];  Service: Cardiovascular;  Laterality: Bilateral;   AMPUTATION Left 04/28/2019   Procedure: LEFT BELOW KNEE AMPUTATION;  Surgeon: Newt Minion, MD;  Location: [place];  Service: Orthopedics;  Laterality: Left;   ANTERIOR CERVICAL DECOMP/DISCECTOMY FUSION  11/27/1999   C5 -- C6   BELOW KNEE LEG AMPUTATION Left 04/28/2019   BIOPSY  05/21/2019   Procedure: BIOPSY;  Surgeon: Doran Stabler, MD;  Location: [place];  Service: Gastroenterology;;   BIOPSY  12/20/2019   Procedure: BIOPSY;  Surgeon: Irving Copas., MD;  Location: [place];  Service: Gastroenterology;;   CIRCUMCISION  10/07/2001   COLONOSCOPY WITH PROPOFOL N/A 05/21/2019   Procedure: COLONOSCOPY  WITH PROPOFOL;  Surgeon: Doran Stabler, MD;  Location: [place];  Service: Gastroenterology;  Laterality: N/A;   COLONOSCOPY WITH PROPOFOL N/A 12/20/2019   Procedure: COLONOSCOPY WITH PROPOFOL;  Surgeon: Rush Landmark Telford Nab., MD;  Location: [place];  Service: Gastroenterology;  Laterality: N/A;   CYSTOSCOPY WITH INSERTION OF UROLIFT N/A 08/11/2017   Procedure: CYSTOSCOPY WITH INSERTION OF UROLIFT;  Surgeon: Cleon Gustin, MD;  Location: [facility];  Service: Urology;  Laterality: N/A;    ENDOSCOPIC MUCOSAL RESECTION N/A 12/20/2019   Procedure: ENDOSCOPIC MUCOSAL RESECTION;  Surgeon: Rush Landmark Telford Nab., MD;  Location: [place];  Service: Gastroenterology;  Laterality: N/A;   ESOPHAGOGASTRODUODENOSCOPY (EGD) WITH PROPOFOL N/A 12/20/2019   Procedure: ESOPHAGOGASTRODUODENOSCOPY (EGD) WITH PROPOFOL;  Surgeon: Rush Landmark Telford Nab., MD;  Location: [place];  Service: Gastroenterology;  Laterality: N/A;   FLEXIBLE SIGMOIDOSCOPY N/A 05/23/2019   Procedure: FLEXIBLE SIGMOIDOSCOPY;  Surgeon: Doran Stabler, MD;  Location: [place];  Service: Gastroenterology;  Laterality: N/A;   FLEXIBLE SIGMOIDOSCOPY N/A 05/26/2019   Procedure: FLEXIBLE SIGMOIDOSCOPY;  Surgeon: Milus Banister, MD;  Location: [facility];  Service: Endoscopy;  Laterality: N/A;   FOOT SURGERY  2005   HEMOSTASIS CLIP PLACEMENT  05/23/2019   Procedure: HEMOSTASIS CLIP PLACEMENT;  Surgeon: Doran Stabler, MD;  Location: [facility];  Service: Gastroenterology;;   HEMOSTASIS CLIP PLACEMENT  05/26/2019   Procedure: HEMOSTASIS CLIP PLACEMENT;  Surgeon: Milus Banister, MD;  Location: [facility];  Service: Endoscopy;;   HEMOSTASIS CLIP PLACEMENT  12/20/2019   Procedure: HEMOSTASIS CLIP PLACEMENT;  Surgeon: Irving Copas., MD;  Location: [place];  Service: Gastroenterology;;   PERIPHERAL VASCULAR BALLOON ANGIOPLASTY  12/11/2018   Procedure: PERIPHERAL VASCULAR BALLOON ANGIOPLASTY;  Surgeon: Angelia Mould, MD;  Location: [place] [facility];  Service: Cardiovascular;;  Lt. SFA   POLYPECTOMY  12/20/2019   Procedure: POLYPECTOMY;  Surgeon: Rush Landmark Telford Nab., MD;  Location: [place];  Service: Gastroenterology;;   PROSTATE BIOPSY  12/12/2015   RADIOACTIVE SEED IMPLANT N/A 05/30/2016   Procedure: RADIOACTIVE SEED IMPLANT/BRACHYTHERAPY IMPLANT WITH SPACE OAR;  Surgeon: Carolan Clines, MD;  Location: [facility];  Service: Urology;  Laterality: N/A;    SHOULDER ARTHROSCOPY WITH SUBACROMIAL DECOMPRESSION Left 11/28/2006   Debridement/ Acromioplasty/  CA ligament relesase   SUBMUCOSAL LIFTING INJECTION  12/20/2019   Procedure: SUBMUCOSAL LIFTING INJECTION;  Surgeon: Irving Copas., MD;  Location: [place];  Service: Gastroenterology;;   TRANSTHORACIC ECHOCARDIOGRAM  05/15/2015   mild LVH,  ef 65-70%, codominant mitral inflow-iindeterminate diastolic function/     Current Facility-Administered Medications  Medication Dose Route Frequency Provider Last Rate Last Admin   0.9 %  sodium chloride infusion   Intravenous Continuous Mansouraty, Telford Nab., MD        Current Facility-Administered Medications:    0.9 %  sodium chloride infusion, , Intravenous, Continuous, Mansouraty, Telford Nab., MD No Known Allergies Family History  Problem Relation Age of Onset   Hypertension Mother    Hypertension Father    Cancer Brother        prostate   Social History   Socioeconomic History   Marital status: Divorced    Spouse name: Not on file   Number of children: Not on file   Years of education: Not on file   Highest education level: Not on file  Occupational History   Not on file  Tobacco Use   Smoking status: Former    Packs/day: 0.25  Years: 45.00    Pack years: 11.25    Types: Cigarettes   Smokeless tobacco: Never  Vaping Use   Vaping Use: Never used  Substance and Sexual Activity   Alcohol use: No   Drug use: No   Sexual activity: Yes  Other Topics Concern   Not on file  Social History Narrative   Not on file   Social Determinants of Health   Financial Resource Strain: Not on file  Food Insecurity: Not on file  Transportation Needs: Not on file  Physical Activity: Not on file  Stress: Not on file  Social Connections: Not on file  Intimate Partner Violence: Not on file    Physical Exam: Today's Vitals   10/25/20 1010 02/19/21 0717  BP:  129/79  Pulse:  71  Resp:  10  Temp:  (!) 97.5 F (36.4 C)   TempSrc:  Temporal  SpO2:  99%  Weight: 71.5 kg   Height: 5\' 7"  (1.702 m)   PainSc:  0-No pain   Body mass index is 24.7 kg/m. GEN: NAD EYE: Sclerae anicteric ENT: MMM CV: Non-tachycardic GI: Soft, NT/ND NEURO:  Alert & Oriented x 3  Lab Results: No results for input(s): WBC, HGB, HCT, PLT in the last 72 hours. BMET No results for input(s): NA, K, CL, CO2, GLUCOSE, BUN, CREATININE, CALCIUM in the last 72 hours. LFT No results for input(s): PROT, ALBUMIN, AST, ALT, ALKPHOS, BILITOT, BILIDIR, IBILI in the last 72 hours. PT/INR No results for input(s): LABPROT, INR in the last 72 hours.   Impression / Plan: This is a 66 y.o.male who presents for EGD/Colonoscopy.  History of Cecal TVA status post piecemeal resection and rectal TVA status post resection in December 2021.  He no showed on 2 occasions and has been rescheduled for today.  EGD for iron deficiency and H. pylori follow-up and colonoscopy for diarrhea and follow-up large polyp.  Needs random colon biopsies as well (elevated inflammatory marker and for diarrhea).   The risks and benefits of endoscopic evaluation/treatment were discussed with the patient and/or family; these include but are not limited to the risk of perforation, infection, bleeding, missed lesions, lack of diagnosis, severe illness requiring hospitalization, as well as anesthesia and sedation related illnesses.  The patient's history has been reviewed, patient examined, no change in status, and deemed stable for procedure.  The patient and/or family is agreeable to proceed.    Justice Britain, MD [facility] Office # [phone]

## 2021-02-19 NOTE — Anesthesia Postprocedure Evaluation (Signed)
Anesthesia Post Note  Patient: Billy Torres  Procedure(s) Performed: COLONOSCOPY WITH PROPOFOL ENDOSCOPIC MUCOSAL RESECTION ESOPHAGOGASTRODUODENOSCOPY (EGD) BIOPSY POLYPECTOMY SUBMUCOSAL LIFTING INJECTION     Patient location during evaluation: PACU Anesthesia Type: MAC Level of consciousness: awake and alert Pain management: pain level controlled Vital Signs Assessment: post-procedure vital signs reviewed and stable Respiratory status: spontaneous breathing, nonlabored ventilation, respiratory function stable and patient connected to nasal cannula oxygen Cardiovascular status: stable and blood pressure returned to baseline Postop Assessment: no apparent nausea or vomiting Anesthetic complications: no   No notable events documented.  Last Vitals:  Vitals:   02/19/21 0910 02/19/21 0920  BP: (!) 198/81 (!) 217/79  Pulse: 93 74  Resp: 18 17  Temp:    SpO2: 100% 95%    Last Pain:  Vitals:   02/19/21 0920  TempSrc:   PainSc: 0-No pain                 Elanor Cale S

## 2021-02-19 NOTE — Transfer of Care (Signed)
Immediate Anesthesia Transfer of Care Note  Patient: Billy Torres  Procedure(s) Performed: COLONOSCOPY WITH PROPOFOL ENDOSCOPIC MUCOSAL RESECTION ESOPHAGOGASTRODUODENOSCOPY (EGD) BIOPSY POLYPECTOMY SUBMUCOSAL LIFTING INJECTION  Patient Location: PACU  Anesthesia Type:MAC  Level of Consciousness: sedated  Airway & Oxygen Therapy: Patient Spontanous Breathing and Patient connected to face mask oxygen  Post-op Assessment: Report given to RN and Post -op Vital signs reviewed and stable  Post vital signs: Reviewed and stable  Last Vitals:  Vitals Value Taken Time  BP 181/78 02/19/21 0904  Temp    Pulse 67 02/19/21 0905  Resp 11 02/19/21 0905  SpO2 99 % 02/19/21 0905  Vitals shown include unvalidated device data.  Last Pain:  Vitals:   02/19/21 0717  TempSrc: Temporal  PainSc: 0-No pain         Complications: No notable events documented.

## 2021-02-19 NOTE — Anesthesia Preprocedure Evaluation (Signed)
Anesthesia Evaluation  Patient identified by MRN, date of birth, ID band Patient awake    Reviewed: Allergy & Precautions, NPO status , Patient's Chart, lab work & pertinent test results  Airway Mallampati: II  TM Distance: >3 FB Neck ROM: Full    Dental  (+) Upper Dentures, Lower Dentures   Pulmonary neg pulmonary ROS, former smoker,    Pulmonary exam normal breath sounds clear to auscultation       Cardiovascular hypertension, Normal cardiovascular exam Rhythm:Regular Rate:Normal     Neuro/Psych TIACVA, Residual Symptoms negative psych ROS   GI/Hepatic negative GI ROS, Neg liver ROS,   Endo/Other  diabetes  Renal/GU negative Renal ROS  negative genitourinary   Musculoskeletal negative musculoskeletal ROS (+)   Abdominal   Peds negative pediatric ROS (+)  Hematology negative hematology ROS (+)   Anesthesia Other Findings   Reproductive/Obstetrics negative OB ROS                             Anesthesia Physical Anesthesia Plan  ASA: 3  Anesthesia Plan: MAC   Post-op Pain Management: Minimal or no pain anticipated   Induction: Intravenous  PONV Risk Score and Plan: 1 and Propofol infusion and Treatment may vary due to age or medical condition  Airway Management Planned: Simple Face Mask  Additional Equipment:   Intra-op Plan:   Post-operative Plan:   Informed Consent: I have reviewed the patients History and Physical, chart, labs and discussed the procedure including the risks, benefits and alternatives for the proposed anesthesia with the patient or authorized representative who has indicated his/her understanding and acceptance.     Dental advisory given  Plan Discussed with: CRNA and Surgeon  Anesthesia Plan Comments:         Anesthesia Quick Evaluation

## 2021-02-20 ENCOUNTER — Encounter: Payer: Self-pay | Admitting: Gastroenterology

## 2021-02-20 LAB — SURGICAL PATHOLOGY

## 2021-02-21 ENCOUNTER — Encounter (HOSPITAL_COMMUNITY): Payer: Self-pay | Admitting: Gastroenterology

## 2021-02-23 ENCOUNTER — Telehealth: Payer: Self-pay

## 2021-02-23 NOTE — Telephone Encounter (Signed)
-----   Message from Nelida Meuse III, MD sent at 02/06/2021  9:57 AM EST ----- Regarding: Overdue for H. pylori stool antigen testing Billy Torres,  I received a chart reminder through epic that this patient is overdue for his H. pylori stool antigen test to confirm eradication after treatment about a year ago.  Please contact patient and make arrangements.  If he is still on a PPI such as the omeprazole that was previously prescribed, he needs to hold it 5 days prior to the test.  HD

## 2021-02-23 NOTE — Telephone Encounter (Signed)
Multiple attempts to reach patient have been made. No contact letter mailed.

## 2021-02-28 NOTE — Op Note (Signed)
[facility] Patient Name: Billy Torres Procedure Date: 02/19/2021 MRN: [id number] Attending MD: Justice Britain , MD Date of Birth: 10/26/1955 CSN: [id number] Age: 66 Admit Type: Outpatient Procedure:                Colonoscopy Indications:              Surveillance: Piecemeal removal of large sessile                            adenoma last colonoscopy (< 2 yrs), Diarrhea Providers:                Justice Britain, MD, Burtis Junes, RN, Tyna Jaksch Technician Referring MD:             Estill Cotta. Loletha Carrow, MD, [facility] Medicines:                Monitored Anesthesia Care Complications:            No immediate complications. Estimated Blood Loss:     Estimated blood loss was minimal. Procedure:                Pre-Anesthesia Assessment:                           - Prior to the procedure, a History and Physical                            was performed, and patient medications and                            allergies were reviewed. The patient's tolerance of                            previous anesthesia was also reviewed. The risks                            and benefits of the procedure and the sedation                            options and risks were discussed with the patient.                            All questions were answered, and informed consent                            was obtained. Prior Anticoagulants: The patient has                            taken no previous anticoagulant or antiplatelet  agents. ASA Grade Assessment: III - A patient with                            severe systemic disease. After reviewing the risks                            and benefits, the patient was deemed in                            satisfactory condition to undergo the procedure.                           After obtaining informed consent, the colonoscope                             was passed under direct vision. Throughout the                            procedure, the patient's blood pressure, pulse, and                            oxygen saturations were monitored continuously. The                            CF-HQ190L ([id number]) Olympus colonoscope was                            introduced through the anus and advanced to the 5                            cm into the ileum. The colonoscopy was performed                            without difficulty. The patient tolerated the                            procedure. The quality of the bowel preparation was                            adequate. The terminal ileum, ileocecal valve,                            appendiceal orifice, and rectum were photographed. Scope In: 7:58:50 AM Scope Out: 8:55:09 AM Scope Withdrawal Time: 0 hours 51 minutes 51 seconds  Total Procedure Duration: 0 hours 56 minutes 19 seconds  Findings:      The digital rectal exam findings include hemorrhoids. Pertinent       negatives include no palpable rectal lesions.      A large amount of liquid semi-liquid stool was found in the entire       colon, interfering with visualization. Lavage of the area was performed       using copious amounts, resulting in clearance with adequate       visualization.      A large post mucosectomy scar was  found in the cecum. There was evidence       however of some residual polypoid tissue approximately 20 mm in size.       The polypo is on the lip of the cecal side of the ICV but does not enter       into it as of yet. Preparations were made for mucosal resection. NBI       imaging and White-light endoscopy was done to demarcate the borders of       the lesion. Olympus Endoclot was injected to raise the lesion with some       success but not all of it lifted due to the previous scarring/fibrosis.       Piecemeal mucosal resection using a snare was performed. Avulsion was       performed of the  regions that did not come off with snaring. Resection       and retrieval were complete. Coagulation for tissue destruction using       snare tip soft cautery to the margin was successful. To prevent bleeding       after mucosal resection, two hemostatic clips were successfully placed       (MR conditional). There was no bleeding at the end of the procedure.      Two sessile polyps were found in the rectum and ascending colon. The       polyps were 4 to 6 mm in size. These polyps were removed with a cold       snare. Resection and retrieval were complete.      Normal mucosa was found in the entire colon otherwise. Biopsies for       histology were taken with a cold forceps from the entire colon for       evaluation of microscopic colitis.      Non-bleeding non-thrombosed external and internal hemorrhoids were found       during retroflexion, during perianal exam and during digital exam. The       hemorrhoids were Grade II (internal hemorrhoids that prolapse but reduce       spontaneously). Impression:               - Hemorrhoids found on digital rectal exam.                           - Stool in the entire examined colon - lavaged with                            adequate visualization.                           - Post mucosectomy scar in the cecum. Polypoid                            recurrence present and it touches the lip of the                            cecal side of the ICV. Repeat mucosectomy needed                            today. Piecemeal EMR and Avulsion performed. STSC  to the margin performed. Clips (MR conditional)                            were placed.                           - Two 4 to 6 mm polyps in the rectum and in the                            ascending colon, removed with a cold snare.                            Resected and retrieved.                           - Normal mucosa in the entire examined colon                             otherwise. Biopsied.                           - Non-bleeding non-thrombosed external and internal                            hemorrhoids. Moderate Sedation:      Not Applicable - Patient had care per Anesthesia. Recommendation:           - The patient will be observed post-procedure,                            until all discharge criteria are met.                           - Discharge patient to home.                           - Patient has a contact number available for                            emergencies. The signs and symptoms of potential                            delayed complications were discussed with the                            patient. Return to normal activities tomorrow.                            Written discharge instructions were provided to the                            patient.                           - Resume previous diet.                           -  Use FiberCon 1-2 tablets PO daily.                           - Monitor for signs/symptoms of bleeding,                            perforation, and infection. If issues please call                            our number to get further assistance as needed.                           - Continue present medications otherwise.                           - Minimize NSAIDs for next 2-weeks.                           - Await pathology results.                           - Repeat colonoscopy in 6-9 months for surveillance                            after piecemeal polypectomy.                           - The findings and recommendations were discussed                            with the patient.                           - The findings and recommendations were discussed                            with the patient's family. Procedure Code(s):        --- Professional ---                           [phone], Colonoscopy, flexible; with endoscopic                            mucosal resection                           45385, 43,  Colonoscopy, flexible; with removal of                            tumor(s), polyp(s), or other lesion(s) by snare                            technique                           45380, 38, Colonoscopy, flexible; with biopsy,  single or multiple Diagnosis Code(s):        --- Professional ---                           Z86.010, Personal history of colonic polyps                           K64.1, Second degree hemorrhoids                           Z98.890, Other specified postprocedural states                           K62.1, Rectal polyp                           K63.5, Polyp of colon CPT copyright 2019 [employer]. All rights reserved. The codes documented in this report are preliminary and upon coder review may  be revised to meet current compliance requirements. Justice Britain, MD 02/19/2021 9:21:53 AM Number of Addenda: 0

## 2021-02-28 NOTE — Op Note (Signed)
[facility] Patient Name: Billy Torres Procedure Date: 02/19/2021 MRN: [id number] Attending MD: Justice Britain , MD Date of Birth: 1955-11-03 CSN: [id number] Age: 66 Admit Type: Outpatient Procedure:                Upper GI endoscopy Indications:              Iron deficiency anemia, Diarrhea Providers:                Justice Britain, MD, Burtis Junes, RN, Tyna Jaksch Technician Referring MD:             Estill Cotta. Loletha Carrow, MD, Lamont @. Home Medicines:                Monitored Anesthesia Care Complications:            No immediate complications. Estimated Blood Loss:     Estimated blood loss was minimal. Procedure:                Pre-Anesthesia Assessment:                           - Prior to the procedure, a History and Physical                            was performed, and patient medications and                            allergies were reviewed. The patient's tolerance of                            previous anesthesia was also reviewed. The risks                            and benefits of the procedure and the sedation                            options and risks were discussed with the patient.                            All questions were answered, and informed consent                            was obtained. Prior Anticoagulants: The patient has                            taken no previous anticoagulant or antiplatelet                            agents. ASA Grade Assessment: III - A patient with                            severe systemic disease. After reviewing the risks  and benefits, the patient was deemed in                            satisfactory condition to undergo the procedure.                           After obtaining informed consent, the endoscope was                            passed under direct vision. Throughout the                            procedure, the patient's blood pressure, pulse,  and                            oxygen saturations were monitored continuously. The                            [id number]-H190 ([id number] Olympus endoscope was introduced                            through the mouth, and advanced to the second part                            of duodenum. The upper GI endoscopy was                            accomplished without difficulty. The patient                            tolerated the procedure. Scope In: Scope Out: Findings:      No gross lesions were noted in the entire esophagus.      The Z-line was regular and was found 40 cm from the incisors.      Patchy moderate inflammation characterized by erosions and erythema was       found in the entire examined stomach. Biopsies were taken with a cold       forceps for histology and Helicobacter pylori testing.      No gross lesions were noted in the duodenal bulb, in the first portion       of the duodenum and in the second portion of the duodenum. Biopsies were       taken with a cold forceps for histology. Impression:               - No gross lesions in esophagus. Z-line regular, 40                            cm from the incisors.                           - Gastritis. Biopsied.                           - No gross lesions in the duodenal bulb, in the  first portion of the duodenum and in the second                            portion of the duodenum. Biopsied. Moderate Sedation:      Not Applicable - Patient had care per Anesthesia. Recommendation:           - Proceed to scheduled colonoscopy.                           - Observe patient's clinical course.                           - Restart Omeprazole 40 mg daily.                           - Await pathology results.                           - If IDA persists will need to consider VCE in                            future with primary GI.                           - The findings and recommendations were discussed                             with the patient.                           - The findings and recommendations were discussed                            with the patient's family. Procedure Code(s):        --- Professional ---                           [phone], Esophagogastroduodenoscopy, flexible,                            transoral; with biopsy, single or multiple Diagnosis Code(s):        --- Professional ---                           K29.70, Gastritis, unspecified, without bleeding                           D50.9, Iron deficiency anemia, unspecified                           R19.7, Diarrhea, unspecified CPT copyright 2019 [employer]. All rights reserved. The codes documented in this report are preliminary and upon coder review may  be revised to meet current compliance requirements. Justice Britain, MD 02/19/2021 9:08:40 AM Number of Addenda: 0

## 2021-03-26 ENCOUNTER — Telehealth: Payer: Self-pay | Admitting: Gastroenterology

## 2021-03-26 NOTE — Telephone Encounter (Signed)
ERROR

## 2021-07-30 ENCOUNTER — Ambulatory Visit: Payer: Medicare (Managed Care) | Admitting: Orthopedic Surgery

## 2021-07-31 ENCOUNTER — Ambulatory Visit: Payer: Medicare (Managed Care) | Admitting: Gastroenterology

## 2021-08-30 ENCOUNTER — Ambulatory Visit: Payer: Medicare (Managed Care) | Admitting: Orthopedic Surgery

## 2021-10-26 ENCOUNTER — Inpatient Hospital Stay (HOSPITAL_COMMUNITY)
Admission: EM | Admit: 2021-10-26 | Discharge: 2021-10-30 | DRG: 481 | Disposition: A | Discharge status: Skilled Nursing Facility | Payer: Medicare (Managed Care) | Attending: Internal Medicine | Admitting: Internal Medicine

## 2021-10-26 ENCOUNTER — Emergency Department (HOSPITAL_COMMUNITY): Payer: Medicare (Managed Care)

## 2021-10-26 DIAGNOSIS — I10 Essential (primary) hypertension: Secondary | ICD-10-CM | POA: Diagnosis not present

## 2021-10-26 DIAGNOSIS — E1122 Type 2 diabetes mellitus with diabetic chronic kidney disease: Secondary | ICD-10-CM | POA: Diagnosis present

## 2021-10-26 DIAGNOSIS — W010XXA Fall on same level from slipping, tripping and stumbling without subsequent striking against object, initial encounter: Secondary | ICD-10-CM | POA: Diagnosis present

## 2021-10-26 DIAGNOSIS — D72829 Elevated white blood cell count, unspecified: Secondary | ICD-10-CM | POA: Diagnosis present

## 2021-10-26 DIAGNOSIS — Z8546 Personal history of malignant neoplasm of prostate: Secondary | ICD-10-CM | POA: Diagnosis not present

## 2021-10-26 DIAGNOSIS — Z87891 Personal history of nicotine dependence: Secondary | ICD-10-CM

## 2021-10-26 DIAGNOSIS — Z7984 Long term (current) use of oral hypoglycemic drugs: Secondary | ICD-10-CM | POA: Diagnosis not present

## 2021-10-26 DIAGNOSIS — S72002A Fracture of unspecified part of neck of left femur, initial encounter for closed fracture: Secondary | ICD-10-CM | POA: Diagnosis present

## 2021-10-26 DIAGNOSIS — E1151 Type 2 diabetes mellitus with diabetic peripheral angiopathy without gangrene: Secondary | ICD-10-CM | POA: Diagnosis present

## 2021-10-26 DIAGNOSIS — I129 Hypertensive chronic kidney disease with stage 1 through stage 4 chronic kidney disease, or unspecified chronic kidney disease: Secondary | ICD-10-CM | POA: Diagnosis present

## 2021-10-26 DIAGNOSIS — N182 Chronic kidney disease, stage 2 (mild): Secondary | ICD-10-CM | POA: Diagnosis present

## 2021-10-26 DIAGNOSIS — E088 Diabetes mellitus due to underlying condition with unspecified complications: Secondary | ICD-10-CM | POA: Diagnosis present

## 2021-10-26 DIAGNOSIS — N400 Enlarged prostate without lower urinary tract symptoms: Secondary | ICD-10-CM | POA: Diagnosis present

## 2021-10-26 DIAGNOSIS — S7222XA Displaced subtrochanteric fracture of left femur, initial encounter for closed fracture: Principal | ICD-10-CM | POA: Diagnosis present

## 2021-10-26 DIAGNOSIS — Y92009 Unspecified place in unspecified non-institutional (private) residence as the place of occurrence of the external cause: Secondary | ICD-10-CM

## 2021-10-26 DIAGNOSIS — K219 Gastro-esophageal reflux disease without esophagitis: Secondary | ICD-10-CM | POA: Diagnosis present

## 2021-10-26 DIAGNOSIS — Z981 Arthrodesis status: Secondary | ICD-10-CM

## 2021-10-26 DIAGNOSIS — E1165 Type 2 diabetes mellitus with hyperglycemia: Secondary | ICD-10-CM | POA: Diagnosis present

## 2021-10-26 DIAGNOSIS — E785 Hyperlipidemia, unspecified: Secondary | ICD-10-CM | POA: Diagnosis present

## 2021-10-26 DIAGNOSIS — Z7985 Long-term (current) use of injectable non-insulin antidiabetic drugs: Secondary | ICD-10-CM

## 2021-10-26 DIAGNOSIS — I69354 Hemiplegia and hemiparesis following cerebral infarction affecting left non-dominant side: Secondary | ICD-10-CM

## 2021-10-26 DIAGNOSIS — J309 Allergic rhinitis, unspecified: Secondary | ICD-10-CM | POA: Diagnosis present

## 2021-10-26 DIAGNOSIS — Z89512 Acquired absence of left leg below knee: Secondary | ICD-10-CM

## 2021-10-26 DIAGNOSIS — L89151 Pressure ulcer of sacral region, stage 1: Secondary | ICD-10-CM | POA: Diagnosis present

## 2021-10-26 DIAGNOSIS — Z79899 Other long term (current) drug therapy: Secondary | ICD-10-CM

## 2021-10-26 DIAGNOSIS — W19XXXA Unspecified fall, initial encounter: Secondary | ICD-10-CM | POA: Diagnosis not present

## 2021-10-26 DIAGNOSIS — S72142A Displaced intertrochanteric fracture of left femur, initial encounter for closed fracture: Secondary | ICD-10-CM | POA: Diagnosis present

## 2021-10-26 DIAGNOSIS — E1142 Type 2 diabetes mellitus with diabetic polyneuropathy: Secondary | ICD-10-CM | POA: Diagnosis present

## 2021-10-26 DIAGNOSIS — Z8249 Family history of ischemic heart disease and other diseases of the circulatory system: Secondary | ICD-10-CM | POA: Diagnosis not present

## 2021-10-26 LAB — TYPE AND SCREEN
ABO/RH(D): O POS
Antibody Screen: NEGATIVE

## 2021-10-26 LAB — CBC WITH DIFFERENTIAL/PLATELET
Abs Immature Granulocytes: 0.05 10*3/uL (ref 0.00–0.07)
Basophils Absolute: 0 10*3/uL (ref 0.0–0.1)
Basophils Relative: 0 %
Eosinophils Absolute: 0.1 10*3/uL (ref 0.0–0.5)
Eosinophils Relative: 0 %
HCT: 41.4 % (ref 39.0–52.0)
Hemoglobin: 13.7 g/dL (ref 13.0–17.0)
Immature Granulocytes: 0 %
Lymphocytes Relative: 13 %
Lymphs Abs: 1.5 10*3/uL (ref 0.7–4.0)
MCH: 28.7 pg (ref 26.0–34.0)
MCHC: 33.1 g/dL (ref 30.0–36.0)
MCV: 86.6 fL (ref 80.0–100.0)
Monocytes Absolute: 0.6 10*3/uL (ref 0.1–1.0)
Monocytes Relative: 5 %
Neutro Abs: 9.6 10*3/uL — ABNORMAL HIGH (ref 1.7–7.7)
Neutrophils Relative %: 82 %
Platelets: 222 10*3/uL (ref 150–400)
RBC: 4.78 MIL/uL (ref 4.22–5.81)
RDW: 11.9 % (ref 11.5–15.5)
WBC: 11.8 10*3/uL — ABNORMAL HIGH (ref 4.0–10.5)
nRBC: 0 % (ref 0.0–0.2)

## 2021-10-26 LAB — BASIC METABOLIC PANEL
Anion gap: 8 (ref 5–15)
BUN: 15 mg/dL (ref 8–23)
CO2: 24 mmol/L (ref 22–32)
Calcium: 8.6 mg/dL — ABNORMAL LOW (ref 8.9–10.3)
Chloride: 104 mmol/L (ref 98–111)
Creatinine, Ser: 1.21 mg/dL (ref 0.61–1.24)
GFR, Estimated: >60 mL/min (ref >60)
Glucose, Bld: 225 mg/dL — ABNORMAL HIGH (ref 70–99)
Potassium: 4 mmol/L (ref 3.5–5.1)
Sodium: 136 mmol/L (ref 135–145)

## 2021-10-26 LAB — PROTIME-INR
INR: 1 (ref 0.8–1.2)
Prothrombin Time: 13 seconds (ref 11.4–15.2)

## 2021-10-26 MED ORDER — HYDROMORPHONE HCL 1 MG/ML IJ SOLN
0.5000 mg | INTRAMUSCULAR | Status: DC | PRN
  Administered 2021-10-27 (×2): 0.5 mg via INTRAVENOUS
  Filled 2021-10-26: qty 1
  Filled 2021-10-26: qty 0.5
  Filled 2021-10-26: qty 1

## 2021-10-26 MED ORDER — ONDANSETRON HCL 4 MG/2ML IJ SOLN
4.0000 mg | Freq: Once | INTRAMUSCULAR | Status: AC
  Administered 2021-10-26: 4 mg via INTRAVENOUS
  Filled 2021-10-26: qty 2

## 2021-10-26 MED ORDER — NALOXONE HCL 0.4 MG/ML IJ SOLN: 0.4000 mg | INTRAMUSCULAR | Status: DC | PRN

## 2021-10-26 MED ORDER — ACETAMINOPHEN 650 MG RE SUPP: 650.0000 mg | Freq: Four times a day (QID) | RECTAL | Status: DC | PRN

## 2021-10-26 MED ORDER — ONDANSETRON HCL 4 MG/2ML IJ SOLN: 4.0000 mg | Freq: Four times a day (QID) | INTRAMUSCULAR | Status: DC | PRN

## 2021-10-26 MED ORDER — FENTANYL CITRATE PF 50 MCG/ML IJ SOSY
100.0000 ug | PREFILLED_SYRINGE | Freq: Once | INTRAMUSCULAR | Status: AC
  Administered 2021-10-26: 100 ug via INTRAVENOUS
  Filled 2021-10-26: qty 2

## 2021-10-26 MED ORDER — ACETAMINOPHEN 325 MG PO TABS
650.0000 mg | ORAL_TABLET | Freq: Four times a day (QID) | ORAL | Status: DC | PRN
  Filled 2021-10-26: qty 2

## 2021-10-26 MED ORDER — HYDROMORPHONE HCL 1 MG/ML IJ SOLN
0.5000 mg | Freq: Once | INTRAMUSCULAR | Status: AC
  Administered 2021-10-27: 0.5 mg via INTRAVENOUS
  Filled 2021-10-26: qty 1

## 2021-10-26 NOTE — ED Triage Notes (Incomplete)
Pt arrives from home via home. Pt stumbled landed on his left side. Left hip pain 8/10, denies neck pain, no LOC, no blood thinners. 150/90, cbg 257, sats hr 80's. Alert and oriented x4

## 2021-10-26 NOTE — ED Notes (Signed)
First set of blood drawn (BMP was clotted) additional blood work sent to lab for testing.

## 2021-10-26 NOTE — ED Provider Notes (Signed)
[place] EMERGENCY DEPARTMENT Provider Note   CSN: [id number] Arrival date & time: 10/26/21  1941     History  No chief complaint on file.   Billy Torres is a 66 y.o. male.  Patient is a 66 year old male with a history of hypertension, hyperlipidemia, stroke with residual left-sided hemiparesis and cognitive deficits, CKD, prior lower GI bleeding, diabetes status post left BKA who is presenting with EMS tonight after a fall at home.  Patient reports that he was going to get his wheelchair and he was walking across the room when his left leg got tangled causing him to fall onto the left side onto the carpet.  Since the fall he has had significant pain in his left leg and hip.  He thinks he may have hit a few chairs with as he was falling.  He denies hitting his head or loss of consciousness.  He denies any pain on the right side of his body.  He he denies any chest pain, palpitations or shortness of breath at this time.  He is requesting something for pain.  EMS did not give patient any pain medication in route.  Patient does not take anticoagulation.  The history is provided by the patient and the EMS personnel.       Home Medications Prior to Admission medications   Medication Sig Start Date End Date Taking? Authorizing Provider  acetaminophen (TYLENOL) 500 MG tablet Take 1,000 mg by mouth 3 (three) times daily as needed (pain).    [provider]  alfuzosin (UROXATRAL) 10 MG 24 hr tablet Take 10 mg by mouth daily with breakfast.    [provider]  atorvastatin (LIPITOR) 40 MG tablet Take 40 mg by mouth at bedtime.    [provider]  bisacodyl (DULCOLAX) 5 MG EC tablet Take 5 mg by mouth daily.    [provider]  Camphor (VICKS VAPO STEAM) 6.2 % LIQD Inhale 10-20 mLs into the lungs every 4 (four) hours as needed (into humidifer).    [provider]  cetirizine (ZYRTEC) 10 MG tablet Take 10 mg by mouth daily.     [provider]  Cholecalciferol (VITAMIN D3) 50 MCG (2000 UT) TABS Take 2,000 Units by mouth in the morning.    [provider]  donepezil (ARICEPT) 10 MG tablet Take 10 mg by mouth at bedtime.    [provider]  Dulaglutide (TRULICITY) 9.83 JA/2.5KN SOPN Inject 0.75 mg into the skin once a week. Takes on Tuesday    [provider]  gabapentin (NEURONTIN) 600 MG tablet Take 600 mg by mouth in the morning.    [provider]  gabapentin (NEURONTIN) 800 MG tablet Take 800 mg by mouth at bedtime.    [provider]  loratadine (CLARITIN) 10 MG tablet Take 10 mg by mouth daily.    [provider]  melatonin 5 MG TABS Take 5 mg by mouth at bedtime.    [provider]  metFORMIN (GLUCOPHAGE) 500 MG tablet Take 500 mg by mouth 2 (two) times daily with a meal.     [provider]  mineral oil-hydrophilic petrolatum (AQUAPHOR) ointment Apply 1 application topically See admin instructions. Apply twice daily for right heel and as needed for dry skin    [provider]  montelukast (SINGULAIR) 5 MG chewable tablet Chew 5 mg by mouth at bedtime.    [provider]  omeprazole (PRILOSEC) 40 MG capsule Take 1 capsule (40  mg total) by mouth daily. 02/19/21   Mansouraty, Telford Nab., MD  polyethylene glycol (MIRALAX / GLYCOLAX) 17 g packet Take 17 g by mouth daily. 02/19/21   Mansouraty, Telford Nab., MD  Semaglutide (RYBELSUS) 7 MG TABS Take 7 mg by mouth daily.    [provider]  simethicone (MYLICON) 80 MG chewable tablet Chew 80 mg by mouth 4 (four) times daily as needed for flatulence.    [provider]  traZODone (DESYREL) 50 MG tablet Take 25 mg by mouth at bedtime.    [provider]  vardenafil (LEVITRA) 10 MG tablet Take 10 mg by mouth daily as needed for erectile dysfunction.    [provider]      Allergies    Patient has no known allergies.    Review of Systems    Review of Systems  Physical Exam Updated Vital Signs BP (!) 151/101   Pulse 87   Temp 98 F (36.7 C) (Oral)   Resp 15   SpO2 95%  Physical Exam Vitals and nursing note reviewed.  Constitutional:      General: He is not in acute distress.    Appearance: He is well-developed.  HENT:     Head: Normocephalic and atraumatic.  Eyes:     Conjunctiva/sclera: Conjunctivae normal.     Pupils: Pupils are equal, round, and reactive to light.  Cardiovascular:     Rate and Rhythm: Normal rate and regular rhythm.     Heart sounds: No murmur heard. Pulmonary:     Effort: Pulmonary effort is normal. No respiratory distress.     Breath sounds: Normal breath sounds. No wheezing or rales.  Abdominal:     General: There is no distension.     Palpations: Abdomen is soft.     Tenderness: There is no abdominal tenderness. There is no guarding or rebound.  Musculoskeletal:        General: Tenderness present.     Cervical back: Normal range of motion and neck supple.     Left hip: Tenderness and bony tenderness present. Decreased range of motion.     Left upper leg: Bony tenderness present. No deformity.     Comments: Left BKA.  Stump is well-healed without acute findings.  Full ROM of the right hip, knee and ankle without pain.  No pain in upper ext.  Skin:    General: Skin is warm and dry.     Findings: No erythema or rash.  Neurological:     Mental Status: He is alert and oriented to person, place, and time. Mental status is at baseline.     Comments: Weakness noted to the left upper and lower ext  Psychiatric:        Mood and Affect: Mood normal.        Behavior: Behavior normal.     ED Results / Procedures / Treatments   Labs (all labs ordered are listed, but only abnormal results are displayed) Labs Reviewed  CBC WITH DIFFERENTIAL/PLATELET - Abnormal; Notable for the following components:      Result Value   WBC 11.8 (*)    Neutro Abs 9.6 (*)    All other components within  normal limits  BASIC METABOLIC PANEL - Abnormal; Notable for the following components:   Glucose, Bld 225 (*)    Calcium 8.6 (*)    All other components within normal limits  PROTIME-INR  MAGNESIUM  MAGNESIUM  COMPREHENSIVE METABOLIC PANEL  CBC WITH DIFFERENTIAL/PLATELET  TYPE AND  SCREEN    EKG EKG Interpretation  Date/Time:  Friday October 26 2021 20:38:19 EDT Ventricular Rate:  70 PR Interval:  162 QRS Duration: 83 QT Interval:  364 QTC Calculation: 393 R Axis:   27 Text Interpretation: Sinus rhythm Atrial premature complex Posterior infarct, old Borderline repolarization abnormality Confirmed by Blanchie Dessert [phone]) on 10/26/2021 11:54:52 PM  Radiology DG FEMUR MIN 2 VIEWS LEFT  Result Date: 10/26/2021 CLINICAL DATA:  Left hip pain after a fall. History of below the knee amputation. EXAM: LEFT FEMUR 2 VIEWS; PELVIS - 1-2 VIEW COMPARISON:  None Available. FINDINGS: Degenerative changes in the lower lumbar spine and hips. Pelvis appears intact. There is an acute comminuted inter trochanteric and subtrochanteric fracture of the proximal left femur with varus angulation of the fracture fragments. Mildly displaced lesser trochanteric fragment. No dislocation at the hip joint. Seed implants are seen in the prostate gland. Vascular calcifications. The midshaft and distal femur are unremarkable. Degenerative changes in the knee joint. IMPRESSION: Acute comminuted inter trochanteric and subtrochanteric fracture of the proximal left femur with varus angulation. Electronically Signed   By: Lucienne Capers M.D.   On: 10/26/2021 22:00   DG Pelvis 1-2 Views  Result Date: 10/26/2021 CLINICAL DATA:  Left hip pain after a fall. History of below the knee amputation. EXAM: LEFT FEMUR 2 VIEWS; PELVIS - 1-2 VIEW COMPARISON:  None Available. FINDINGS: Degenerative changes in the lower lumbar spine and hips. Pelvis appears intact. There is an acute comminuted inter trochanteric and  subtrochanteric fracture of the proximal left femur with varus angulation of the fracture fragments. Mildly displaced lesser trochanteric fragment. No dislocation at the hip joint. Seed implants are seen in the prostate gland. Vascular calcifications. The midshaft and distal femur are unremarkable. Degenerative changes in the knee joint. IMPRESSION: Acute comminuted inter trochanteric and subtrochanteric fracture of the proximal left femur with varus angulation. Electronically Signed   By: Lucienne Capers M.D.   On: 10/26/2021 22:00    Procedures Procedures    Medications Ordered in ED Medications  HYDROmorphone (DILAUDID) injection 0.5 mg (has no administration in time range)  acetaminophen (TYLENOL) tablet 650 mg (has no administration in time range)    Or  acetaminophen (TYLENOL) suppository 650 mg (has no administration in time range)  naloxone (NARCAN) injection 0.4 mg (has no administration in time range)  HYDROmorphone (DILAUDID) injection 0.5 mg (has no administration in time range)  ondansetron (ZOFRAN) injection 4 mg (has no administration in time range)  fentaNYL (SUBLIMAZE) injection 100 mcg (100 mcg Intravenous Given 10/26/21 2046)  ondansetron (ZOFRAN) injection 4 mg (4 mg Intravenous Given 10/26/21 2046)    ED Course/ Medical Decision Making/ A&P                           Medical Decision Making Amount and/or Complexity of Data Reviewed Independent Historian: EMS External Data Reviewed: notes.    Details: From pcp Labs: ordered. Decision-making details documented in ED Course. Radiology: ordered and independent interpretation performed. Decision-making details documented in ED Course.  Risk Prescription drug management. Decision regarding hospitalization.   Pt with multiple medical problems and comorbidities and presenting today with a complaint that caries a high risk for morbidity and mortality.  Here today after a fall at home with ongoing left-sided hip and leg  pain.  Patient is not anticoagulated and denies any head injury.  He is awake and alert and able to answer questions appropriately.  Significant  pain when attempting to move his left hip and concern for possible femur fracture, hip fracture or pelvic fracture.  Low suspicion for other areas of injury.  Seemed to be fall caused by mechanical problems not by acute cardiac or thoracic etiology.  Hip fracture order set initiated.  Patient given pain control.  11:55 PM I independently interpreted patient's labs and CBC, BMP and INR all without acute findings except for minimal leukocytosis of 11 which is most likely acute phase reaction. I have independently visualized and interpreted pt's images today.  X-ray shows an acute comminuted intertrochanteric and subtrochanteric fracture of the proximal left femur with valgus angulation.  Spoke with Dr. Marlou Sa with orthopedic surgery.  Patient will be admitted to the hospitalist service for hip fracture.  Dr. Marlou Sa recommended n.p.o. after midnight and they would probably do the surgery tomorrow afternoon.  He requested no anticoagulation.  Patient does not currently take any anticoagulation.  He was given further pain control.          Final Clinical Impression(s) / ED Diagnoses Final diagnoses:  Displaced intertrochanteric fracture of left femur, initial encounter for closed fracture [facility])    Rx / DC Orders ED Discharge Orders     None         Blanchie Dessert, MD 10/26/21 2355

## 2021-10-26 NOTE — H&P (Signed)
History and Physical    PLEASE NOTE THAT DRAGON DICTATION SOFTWARE WAS USED IN THE CONSTRUCTION OF THIS NOTE.   Billy Torres FIE:[id number] DOB: 08/18/1955 DOA: 10/26/2021  PCP: Patient, No Pcp Per *** Patient coming from: home ***  I have personally briefly reviewed patient's old medical records in [place]  Chief Complaint: ***  HPI: Billy Torres is a 66 y.o. male with medical history significant for *** who is admitted to [facility] on 10/26/2021 with *** after presenting from home*** to [facility] ED complaining of ***.    ***    ***SOB: Denies any associated orthopnea, PND, or new onset peripheral edema. No recent chest pain, diaphoresis, palpitations, N/V, pre-syncope, or syncope. Not associated with any recent cough, wheezing, hemoptysis, new lower extremity erythema, or calf tenderness. Denies any recent trauma, travel, surgical procedures, or periods of prolonged diminished ambulatory status. No recent melena or hematochezia.   Denies any associated subjective fever, chills, rigors, or generalized myalgias. No recent headache, neck stiffness, rhinitis, rhinorrhea, sore throat, abdominal pain, diarrhea, or rash. No known recent COVID-19 exposures. Denies dysuria, gross hematuria, or change in urinary urgency/frequency.  ***   ***misc/infectious: Denies any subjective fever, chills, rigors, or generalized myalgias. Denies any recent headache, neck stiffness, rhinitis, rhinorrhea, sore throat, sob, wheezing, cough, nausea, vomiting, abdominal pain, diarrhea, or rash. No recent traveling or known COVID-19 exposures. Denies dysuria, gross hematuria, or change in urinary urgency/frequency.  Denies any recent chest pain, diaphoresis, or palpitations. ***    ED Course:  Vital signs in the ED were notable for the following: ***  Labs were notable for the following: ***  Imaging and additional notable ED work-up: ***  While in the ED, the following were  administered: ***  Subsequently, the patient was admitted  ***  ***red    Review of Systems: As per HPI otherwise 10 point review of systems negative.   Past Medical History:  Diagnosis Date   Abnormal rate of speech    post stroke   Anticoagulant long-term use    history of plavix   Arthritis of left shoulder region    Chronic constipation    Chronic kidney disease    chronic kidney disease    Cognitive deficit due to old cerebral infarction    DDD (degenerative disc disease), cervical    Degenerative cervical spinal stenosis    Gait disturbance, post-stroke    uses cane/weelchair   History of ischemic left MCA stroke 06/27/2013   w/ evidence hemorrhagia, high grade stenosis bilateral proximal MCA and extensive small matter disease--- symptoms resolved, no residual    History of ischemic stroke without residual deficits 05-14-2015---  residual left hemiparesis and cognitive deficits   small nonhemorrhegic infarct right corona radiata posterior limb and progressive right m2 stenosis   Hyperlipidemia    Hypertension    Insomnia    Internal hemorrhoids    Left hemiparesis ([place])    RESIDUAL FROM STROKE--  WALKS W/ CANE   Lower GI bleed    Mild atherosclerosis of carotid artery, right    RICA 0-39% per duplex 06-28-2013   Neuromuscular disorder (HCC)    neuropathy - feet   OA (osteoarthritis)    LEFT SHOULDER   Painful bladder spasm    Peripheral arterial disease ([place])    Prostate cancer [facility]) urologist-  dr Gaynelle Arabian  oncologist-  dr [name]   dx 12/ 2017-- Stage T1c, Gleason 3+4,  PSA 10.4,  vol 51cc   Sensorineural  hearing loss, bilateral    does not wear hearing aids   Stroke [facility])    Type 2 diabetes mellitus treated with insulin ([place])    Urge incontinence    w frequency, wears depends   Urinary retention    Previously required indwelling Foley   Wears dentures     Past Surgical History:  Procedure Laterality Date   ABDOMINAL AORTOGRAM W/LOWER EXTREMITY  N/A 12/11/2018   Procedure: ABDOMINAL AORTOGRAM W/LOWER EXTREMITY;  Surgeon: Angelia Mould, MD;  Location: [place] [facility];  Service: Cardiovascular;  Laterality: N/A;   ABDOMINAL AORTOGRAM W/LOWER EXTREMITY Bilateral 04/02/2019   Procedure: ABDOMINAL AORTOGRAM W/LOWER EXTREMITY;  Surgeon: Angelia Mould, MD;  Location: [place] [facility];  Service: Cardiovascular;  Laterality: Bilateral;   AMPUTATION Left 04/28/2019   Procedure: LEFT BELOW KNEE AMPUTATION;  Surgeon: Newt Minion, MD;  Location: [place];  Service: Orthopedics;  Laterality: Left;   ANTERIOR CERVICAL DECOMP/DISCECTOMY FUSION  11/27/1999   C5 -- C6   BELOW KNEE LEG AMPUTATION Left 04/28/2019   BIOPSY  05/21/2019   Procedure: BIOPSY;  Surgeon: Doran Stabler, MD;  Location: [place];  Service: Gastroenterology;;   BIOPSY  12/20/2019   Procedure: BIOPSY;  Surgeon: Irving Copas., MD;  Location: [place];  Service: Gastroenterology;;   BIOPSY  02/19/2021   Procedure: BIOPSY;  Surgeon: Irving Copas., MD;  Location: [facility];  Service: Gastroenterology;;   CIRCUMCISION  10/07/2001   COLONOSCOPY WITH PROPOFOL N/A 05/21/2019   Procedure: COLONOSCOPY WITH PROPOFOL;  Surgeon: Doran Stabler, MD;  Location: [place];  Service: Gastroenterology;  Laterality: N/A;   COLONOSCOPY WITH PROPOFOL N/A 12/20/2019   Procedure: COLONOSCOPY WITH PROPOFOL;  Surgeon: Rush Landmark Telford Nab., MD;  Location: [place];  Service: Gastroenterology;  Laterality: N/A;   COLONOSCOPY WITH PROPOFOL N/A 02/19/2021   Procedure: COLONOSCOPY WITH PROPOFOL;  Surgeon: Rush Landmark Telford Nab., MD;  Location: [facility];  Service: Gastroenterology;  Laterality: N/A;   CYSTOSCOPY WITH INSERTION OF UROLIFT N/A 08/11/2017   Procedure: CYSTOSCOPY WITH INSERTION OF UROLIFT;  Surgeon: Cleon Gustin, MD;  Location: [facility];  Service: Urology;  Laterality: N/A;   ENDOSCOPIC MUCOSAL RESECTION N/A 12/20/2019    Procedure: ENDOSCOPIC MUCOSAL RESECTION;  Surgeon: Rush Landmark Telford Nab., MD;  Location: [place];  Service: Gastroenterology;  Laterality: N/A;   ENDOSCOPIC MUCOSAL RESECTION N/A 02/19/2021   Procedure: ENDOSCOPIC MUCOSAL RESECTION;  Surgeon: Rush Landmark Telford Nab., MD;  [facility];  Service: Gastroenterology;  Laterality: N/A;   ESOPHAGOGASTRODUODENOSCOPY N/A 02/19/2021   Procedure: ESOPHAGOGASTRODUODENOSCOPY (EGD);  Surgeon: Irving Copas., MD;  Location: [facility];  Service: Gastroenterology;  Laterality: N/A;   ESOPHAGOGASTRODUODENOSCOPY (EGD) WITH PROPOFOL N/A 12/20/2019   Procedure: ESOPHAGOGASTRODUODENOSCOPY (EGD) WITH PROPOFOL;  Surgeon: Rush Landmark Telford Nab., MD;  Location: [place];  Service: Gastroenterology;  Laterality: N/A;   FLEXIBLE SIGMOIDOSCOPY N/A 05/23/2019   Procedure: FLEXIBLE SIGMOIDOSCOPY;  Surgeon: Doran Stabler, MD;  Location: [place];  Service: Gastroenterology;  Laterality: N/A;   FLEXIBLE SIGMOIDOSCOPY N/A 05/26/2019   Procedure: FLEXIBLE SIGMOIDOSCOPY;  Surgeon: Milus Banister, MD;  Location: [facility];  Service: Endoscopy;  Laterality: N/A;   FOOT SURGERY  2005   HEMOSTASIS CLIP PLACEMENT  05/23/2019   Procedure: HEMOSTASIS CLIP PLACEMENT;  Surgeon: Doran Stabler, MD;  Location[facility];  Service: Gastroenterology;;   HEMOSTASIS CLIP PLACEMENT  05/26/2019   Procedure: HEMOSTASIS CLIP PLACEMENT;  Surgeon: Milus Banister, MD;  Location: [place];  Service: Endoscopy;;   HEMOSTASIS CLIP PLACEMENT  12/20/2019   Procedure: HEMOSTASIS CLIP PLACEMENT;  Surgeon: Irving Copas., MD;  Location: [place];  Service: Gastroenterology;;   HEMOSTASIS CLIP PLACEMENT  02/19/2021   Procedure: HEMOSTASIS CLIP PLACEMENT;  Surgeon: Irving Copas., MD;  Location: [facility];  Service: Gastroenterology;;   PERIPHERAL VASCULAR BALLOON ANGIOPLASTY  12/11/2018   Procedure: PERIPHERAL VASCULAR BALLOON  ANGIOPLASTY;  Surgeon: Angelia Mould, MD;  Location: [place] [facility];  Service: Cardiovascular;;  Lt. SFA   POLYPECTOMY  12/20/2019   Procedure: POLYPECTOMY;  Surgeon: Rush Landmark Telford Nab., MD;  Location: [place];  Service: Gastroenterology;;   POLYPECTOMY  02/19/2021   Procedure: POLYPECTOMY;  Surgeon: Irving Copas., MD;  Location: [facility];  Service: Gastroenterology;;   PROSTATE BIOPSY  12/12/2015   RADIOACTIVE SEED IMPLANT N/A 05/30/2016   Procedure: RADIOACTIVE SEED IMPLANT/BRACHYTHERAPY IMPLANT WITH SPACE OAR;  Surgeon: Carolan Clines, MD;  Location: [facility];  Service: Urology;  Laterality: N/A;   SHOULDER ARTHROSCOPY WITH SUBACROMIAL DECOMPRESSION Left 11/28/2006   Debridement/ Acromioplasty/  CA ligament relesase   SUBMUCOSAL LIFTING INJECTION  12/20/2019   Procedure: SUBMUCOSAL LIFTING INJECTION;  Surgeon: Irving Copas., MD;  Location: [place];  Service: Gastroenterology;;   SUBMUCOSAL LIFTING INJECTION  02/19/2021   Procedure: SUBMUCOSAL LIFTING INJECTION;  Surgeon: Irving Copas., MD;  Location: [facility];  Service: Gastroenterology;;   TRANSTHORACIC ECHOCARDIOGRAM  05/15/2015   mild LVH,  ef 65-70%, codominant mitral inflow-iindeterminate diastolic function/      Social History:  reports that he has quit smoking. His smoking use included cigarettes. He has a 11.25 pack-year smoking history. He has never used smokeless tobacco. He reports that he does not drink alcohol and does not use drugs.   No Known Allergies  Family History  Problem Relation Age of Onset   Hypertension Mother    Hypertension Father    Cancer Brother        prostate    Family history reviewed and not pertinent ***   Prior to Admission medications   Medication Sig Start Date End Date Taking? Authorizing Provider  acetaminophen (TYLENOL) 500 MG tablet Take 1,000 mg by mouth 3 (three) times daily as needed (pain).     [provider]  alfuzosin (UROXATRAL) 10 MG 24 hr tablet Take 10 mg by mouth daily with breakfast.    [provider]  atorvastatin (LIPITOR) 40 MG tablet Take 40 mg by mouth at bedtime.    [provider]  bisacodyl (DULCOLAX) 5 MG EC tablet Take 5 mg by mouth daily.    [provider]  Camphor (VICKS VAPO STEAM) 6.2 % LIQD Inhale 10-20 mLs into the lungs every 4 (four) hours as needed (into humidifer).    [provider]  cetirizine (ZYRTEC) 10 MG tablet Take 10 mg by mouth daily.    [provider]  Cholecalciferol (VITAMIN D3) 50 MCG (2000 UT) TABS Take 2,000 Units by mouth in the morning.    [provider]  donepezil (ARICEPT) 10 MG tablet Take 10 mg by mouth at bedtime.    [provider]  Dulaglutide (TRULICITY) 1.02 VO/5.3GU SOPN Inject 0.75 mg into the skin once a week. Takes on Tuesday    [provider]  gabapentin (NEURONTIN) 600 MG tablet Take 600 mg by mouth in the morning.    [provider]  gabapentin (NEURONTIN) 800 MG tablet Take 800 mg by mouth at bedtime.  [provider]  loratadine (CLARITIN) 10 MG tablet Take 10 mg by mouth daily.    [provider]  melatonin 5 MG TABS Take 5 mg by mouth at bedtime.    [provider]  metFORMIN (GLUCOPHAGE) 500 MG tablet Take 500 mg by mouth 2 (two) times daily with a meal.     [provider]  mineral oil-hydrophilic petrolatum (AQUAPHOR) ointment Apply 1 application topically See admin instructions. Apply twice daily for right heel and as needed for dry skin    [provider]  montelukast (SINGULAIR) 5 MG chewable tablet Chew 5 mg by mouth at bedtime.    [provider]  omeprazole (PRILOSEC) 40 MG capsule Take 1 capsule (40 mg total) by mouth daily. 02/19/21   Mansouraty, Telford Nab., MD  polyethylene glycol (MIRALAX / GLYCOLAX) 17 g packet Take 17 g by mouth daily. 02/19/21   Mansouraty,  Telford Nab., MD  Semaglutide (RYBELSUS) 7 MG TABS Take 7 mg by mouth daily.    [provider]  simethicone (MYLICON) 80 MG chewable tablet Chew 80 mg by mouth 4 (four) times daily as needed for flatulence.    [provider]  traZODone (DESYREL) 50 MG tablet Take 25 mg by mouth at bedtime.    [provider]  vardenafil (LEVITRA) 10 MG tablet Take 10 mg by mouth daily as needed for erectile dysfunction.    [provider]     Objective    Physical Exam: Vitals:   10/26/21 1952 10/26/21 1953 10/26/21 2045 10/26/21 2145  BP:   (!) 147/80 (!) 151/101  Pulse: 84   87  Resp:   18 15  Temp:  98 F (36.7 C)    TempSrc:  Oral    SpO2: 97%   95%    General: appears to be stated age; alert, oriented Skin: warm, dry, no rash Head:  AT/[place] Mouth:  Oral mucosa membranes appear moist, normal dentition Neck: supple; trachea midline Heart:  RRR; did not appreciate any M/R/G Lungs: CTAB, did not appreciate any wheezes, rales, or rhonchi Abdomen: + BS; soft, ND, NT Vascular: 2+ pedal pulses b/l; 2+ radial pulses b/l Extremities: no peripheral edema, no muscle wasting Neuro: strength and sensation intact in upper and lower extremities b/l ***   *** Neuro: 5/5 strength of the proximal and distal flexors and extensors of the upper and lower extremities bilaterally; sensation intact in upper and lower extremities b/l; cranial nerves II through XII grossly intact; no pronator drift; no evidence suggestive of slurred speech, dysarthria, or facial droop; Normal muscle tone. No tremors.  *** Neuro: In the setting of the patient's current mental status and associated inability to follow instructions, unable to perform full neurologic exam at this time.  As such, assessment of strength, sensation, and cranial nerves is limited at this time. Patient noted to spontaneously move all 4 extremities. No tremors.  ***    Labs on Admission: I have personally reviewed  following labs and imaging studies  CBC: Recent Labs  Lab 10/26/21 1954  WBC 11.8*  NEUTROABS 9.6*  HGB 13.7  HCT 41.4  MCV 86.6  PLT 270   Basic Metabolic Panel: Recent Labs  Lab 10/26/21 2200  NA 136  K 4.0  CL 104  CO2 24  GLUCOSE 225*  BUN 15  CREATININE 1.21  CALCIUM 8.6*   GFR: CrCl cannot be calculated (Unknown ideal weight.). Liver Function Tests: No results for input(s): "AST", "ALT", "ALKPHOS", "BILITOT", "PROT", "ALBUMIN"  in the last 168 hours. No results for input(s): "LIPASE", "AMYLASE" in the last 168 hours. No results for input(s): "AMMONIA" in the last 168 hours. Coagulation Profile: Recent Labs  Lab 10/26/21 1954  INR 1.0   Cardiac Enzymes: No results for input(s): "CKTOTAL", "CKMB", "CKMBINDEX", "TROPONINI" in the last 168 hours. BNP (last 3 results) No results for input(s): "PROBNP" in the last 8760 hours. HbA1C: No results for input(s): "HGBA1C" in the last 72 hours. CBG: No results for input(s): "GLUCAP" in the last 168 hours. Lipid Profile: No results for input(s): "CHOL", "HDL", "LDLCALC", "TRIG", "CHOLHDL", "LDLDIRECT" in the last 72 hours. Thyroid Function Tests: No results for input(s): "TSH", "T4TOTAL", "FREET4", "T3FREE", "THYROIDAB" in the last 72 hours. Anemia Panel: No results for input(s): "VITAMINB12", "FOLATE", "FERRITIN", "TIBC", "IRON", "RETICCTPCT" in the last 72 hours. Urine analysis:    Component Value Date/Time   COLORURINE YELLOW 08/15/2016 1933   APPEARANCEUR CLEAR 08/15/2016 1933   LABSPEC 1.008 08/15/2016 1933   PHURINE 6.0 08/15/2016 1933   GLUCOSEU NEGATIVE 08/15/2016 1933   HGBUR NEGATIVE 08/15/2016 1933   BILIRUBINUR NEGATIVE 08/15/2016 [place] NEGATIVE 08/15/2016 [place] NEGATIVE 08/15/2016 1933   UROBILINOGEN 1 02/28/2014 1516   NITRITE NEGATIVE 08/15/2016 1933   LEUKOCYTESUR NEGATIVE 08/15/2016 1933    Radiological Exams on Admission: DG FEMUR MIN 2 VIEWS LEFT  Result Date:  10/26/2021 CLINICAL DATA:  Left hip pain after a fall. History of below the knee amputation. EXAM: LEFT FEMUR 2 VIEWS; PELVIS - 1-2 VIEW COMPARISON:  None Available. FINDINGS: Degenerative changes in the lower lumbar spine and hips. Pelvis appears intact. There is an acute comminuted inter trochanteric and subtrochanteric fracture of the proximal left femur with varus angulation of the fracture fragments. Mildly displaced lesser trochanteric fragment. No dislocation at the hip joint. Seed implants are seen in the prostate gland. Vascular calcifications. The midshaft and distal femur are unremarkable. Degenerative changes in the knee joint. IMPRESSION: Acute comminuted inter trochanteric and subtrochanteric fracture of the proximal left femur with varus angulation. Electronically Signed   By: Lucienne Capers M.D.   On: 10/26/2021 22:00   DG Pelvis 1-2 Views  Result Date: 10/26/2021 CLINICAL DATA:  Left hip pain after a fall. History of below the knee amputation. EXAM: LEFT FEMUR 2 VIEWS; PELVIS - 1-2 VIEW COMPARISON:  None Available. FINDINGS: Degenerative changes in the lower lumbar spine and hips. Pelvis appears intact. There is an acute comminuted inter trochanteric and subtrochanteric fracture of the proximal left femur with varus angulation of the fracture fragments. Mildly displaced lesser trochanteric fragment. No dislocation at the hip joint. Seed implants are seen in the prostate gland. Vascular calcifications. The midshaft and distal femur are unremarkable. Degenerative changes in the knee joint. IMPRESSION: Acute comminuted inter trochanteric and subtrochanteric fracture of the proximal left femur with varus angulation. Electronically Signed   By: Lucienne Capers M.D.   On: 10/26/2021 22:00     EKG: Independently reviewed, with result as described above. ***   Assessment/Plan   Principal Problem:   Closed left hip fracture  (HCC)   ***       ***            ***             ***            ***            ***            ***           ***   ***  DVT prophylaxis: SCD's ***  Code Status: Full code*** Family Communication: none*** Disposition Plan: Per Rounding Team Consults called: none***;  Admission status: ***    PLEASE NOTE THAT DRAGON DICTATION SOFTWARE WAS USED IN THE CONSTRUCTION OF THIS NOTE.   [place] DO [facility]  From [place]   10/26/2021, 11:53 PM   ***

## 2021-10-27 ENCOUNTER — Inpatient Hospital Stay (HOSPITAL_COMMUNITY): Payer: Medicare (Managed Care) | Admitting: Anesthesiology

## 2021-10-27 ENCOUNTER — Encounter (HOSPITAL_COMMUNITY)
Admission: EM | Disposition: A | Discharge status: Skilled Nursing Facility | Payer: Self-pay | Source: Home / Self Care | Attending: Internal Medicine

## 2021-10-27 ENCOUNTER — Other Ambulatory Visit: Payer: Self-pay

## 2021-10-27 ENCOUNTER — Encounter (HOSPITAL_COMMUNITY): Payer: Self-pay | Admitting: Internal Medicine

## 2021-10-27 ENCOUNTER — Inpatient Hospital Stay (HOSPITAL_COMMUNITY): Payer: Medicare (Managed Care)

## 2021-10-27 DIAGNOSIS — J309 Allergic rhinitis, unspecified: Secondary | ICD-10-CM | POA: Diagnosis present

## 2021-10-27 DIAGNOSIS — Z87891 Personal history of nicotine dependence: Secondary | ICD-10-CM

## 2021-10-27 DIAGNOSIS — S72002A Fracture of unspecified part of neck of left femur, initial encounter for closed fracture: Secondary | ICD-10-CM | POA: Diagnosis not present

## 2021-10-27 DIAGNOSIS — E088 Diabetes mellitus due to underlying condition with unspecified complications: Secondary | ICD-10-CM | POA: Diagnosis not present

## 2021-10-27 DIAGNOSIS — I10 Essential (primary) hypertension: Secondary | ICD-10-CM

## 2021-10-27 DIAGNOSIS — S72142A Displaced intertrochanteric fracture of left femur, initial encounter for closed fracture: Secondary | ICD-10-CM | POA: Diagnosis not present

## 2021-10-27 DIAGNOSIS — N400 Enlarged prostate without lower urinary tract symptoms: Secondary | ICD-10-CM | POA: Diagnosis present

## 2021-10-27 DIAGNOSIS — E1151 Type 2 diabetes mellitus with diabetic peripheral angiopathy without gangrene: Secondary | ICD-10-CM

## 2021-10-27 DIAGNOSIS — K219 Gastro-esophageal reflux disease without esophagitis: Secondary | ICD-10-CM | POA: Diagnosis present

## 2021-10-27 DIAGNOSIS — D72829 Elevated white blood cell count, unspecified: Secondary | ICD-10-CM | POA: Diagnosis not present

## 2021-10-27 DIAGNOSIS — W19XXXA Unspecified fall, initial encounter: Secondary | ICD-10-CM

## 2021-10-27 DIAGNOSIS — S7222XA Displaced subtrochanteric fracture of left femur, initial encounter for closed fracture: Secondary | ICD-10-CM

## 2021-10-27 HISTORY — PX: INTRAMEDULLARY (IM) NAIL INTERTROCHANTERIC: SHX5875

## 2021-10-27 HISTORY — PX: INSERTION OF TRACTION PIN: SHX6560

## 2021-10-27 LAB — CBC WITH DIFFERENTIAL/PLATELET
Abs Immature Granulocytes: 0.04 10*3/uL (ref 0.00–0.07)
Basophils Absolute: 0 10*3/uL (ref 0.0–0.1)
Basophils Relative: 0 %
Eosinophils Absolute: 0 10*3/uL (ref 0.0–0.5)
Eosinophils Relative: 0 %
HCT: 40.3 % (ref 39.0–52.0)
Hemoglobin: 13 g/dL (ref 13.0–17.0)
Immature Granulocytes: 0 %
Lymphocytes Relative: 7 %
Lymphs Abs: 0.9 10*3/uL (ref 0.7–4.0)
MCH: 28 pg (ref 26.0–34.0)
MCHC: 32.3 g/dL (ref 30.0–36.0)
MCV: 86.9 fL (ref 80.0–100.0)
Monocytes Absolute: 0.7 10*3/uL (ref 0.1–1.0)
Monocytes Relative: 5 %
Neutro Abs: 11.8 10*3/uL — ABNORMAL HIGH (ref 1.7–7.7)
Neutrophils Relative %: 88 %
Platelets: 228 10*3/uL (ref 150–400)
RBC: 4.64 MIL/uL (ref 4.22–5.81)
RDW: 11.9 % (ref 11.5–15.5)
WBC: 13.4 10*3/uL — ABNORMAL HIGH (ref 4.0–10.5)
nRBC: 0 % (ref 0.0–0.2)

## 2021-10-27 LAB — HEMOGLOBIN A1C
Hgb A1c MFr Bld: 10.6 % — ABNORMAL HIGH (ref 4.8–5.6)
Mean Plasma Glucose: 257.52 mg/dL

## 2021-10-27 LAB — COMPREHENSIVE METABOLIC PANEL
ALT: 29 U/L (ref 0–44)
AST: 27 U/L (ref 15–41)
Albumin: 3.4 g/dL — ABNORMAL LOW (ref 3.5–5.0)
Alkaline Phosphatase: 62 U/L (ref 38–126)
Anion gap: 10 (ref 5–15)
BUN: 14 mg/dL (ref 8–23)
CO2: 24 mmol/L (ref 22–32)
Calcium: 8.7 mg/dL — ABNORMAL LOW (ref 8.9–10.3)
Chloride: 100 mmol/L (ref 98–111)
Creatinine, Ser: 1.19 mg/dL (ref 0.61–1.24)
GFR, Estimated: >60 mL/min (ref >60)
Glucose, Bld: 253 mg/dL — ABNORMAL HIGH (ref 70–99)
Potassium: 4.2 mmol/L (ref 3.5–5.1)
Sodium: 134 mmol/L — ABNORMAL LOW (ref 135–145)
Total Bilirubin: 0.8 mg/dL (ref 0.3–1.2)
Total Protein: 6.6 g/dL (ref 6.5–8.1)

## 2021-10-27 LAB — CBG MONITORING, ED
Glucose-Capillary: 241 mg/dL — ABNORMAL HIGH (ref 70–99)
Glucose-Capillary: 252 mg/dL — ABNORMAL HIGH (ref 70–99)
Glucose-Capillary: 174 mg/dL — ABNORMAL HIGH (ref 70–99)

## 2021-10-27 LAB — GLUCOSE, CAPILLARY
Glucose-Capillary: 144 mg/dL — ABNORMAL HIGH (ref 70–99)
Glucose-Capillary: 185 mg/dL — ABNORMAL HIGH (ref 70–99)

## 2021-10-27 LAB — MAGNESIUM: Magnesium: 1.4 mg/dL — ABNORMAL LOW (ref 1.7–2.4)

## 2021-10-27 LAB — VITAMIN D 25 HYDROXY (VIT D DEFICIENCY, FRACTURES): Vit D, 25-Hydroxy: 46.84 ng/mL (ref 30–100)

## 2021-10-27 SURGERY — FIXATION, FRACTURE, INTERTROCHANTERIC, WITH INTRAMEDULLARY ROD
Anesthesia: Monitor Anesthesia Care | Site: Leg Lower | Laterality: Left

## 2021-10-27 MED ORDER — PROPOFOL 10 MG/ML IV BOLUS
INTRAVENOUS | Status: AC
  Filled 2021-10-27: qty 20

## 2021-10-27 MED ORDER — ASPIRIN 81 MG PO CHEW
81.0000 mg | CHEWABLE_TABLET | Freq: Two times a day (BID) | ORAL | Status: DC
  Administered 2021-10-27 – 2021-10-30 (×6): 81 mg via ORAL
  Filled 2021-10-27 (×6): qty 1

## 2021-10-27 MED ORDER — DEXAMETHASONE SODIUM PHOSPHATE 10 MG/ML IJ SOLN
INTRAMUSCULAR | Status: AC
  Filled 2021-10-27: qty 1

## 2021-10-27 MED ORDER — CHLORHEXIDINE GLUCONATE 4 % EX LIQD: 60.0000 mL | Freq: Once | CUTANEOUS | Status: DC

## 2021-10-27 MED ORDER — SUCCINYLCHOLINE CHLORIDE 200 MG/10ML IV SOSY
PREFILLED_SYRINGE | INTRAVENOUS | Status: AC
  Filled 2021-10-27: qty 10

## 2021-10-27 MED ORDER — METHOCARBAMOL 1000 MG/10ML IJ SOLN: 500.0000 mg | Freq: Four times a day (QID) | INTRAVENOUS | Status: DC | PRN

## 2021-10-27 MED ORDER — LACTATED RINGERS IV SOLN: INTRAVENOUS | Status: DC

## 2021-10-27 MED ORDER — 0.9 % SODIUM CHLORIDE (POUR BTL) OPTIME
TOPICAL | Status: DC | PRN
  Administered 2021-10-27: 1000 mL

## 2021-10-27 MED ORDER — ONDANSETRON HCL 4 MG/2ML IJ SOLN: 4.0000 mg | Freq: Four times a day (QID) | INTRAMUSCULAR | Status: DC | PRN

## 2021-10-27 MED ORDER — FENTANYL CITRATE (PF) 250 MCG/5ML IJ SOLN
INTRAMUSCULAR | Status: AC
  Filled 2021-10-27: qty 5

## 2021-10-27 MED ORDER — FENTANYL CITRATE (PF) 100 MCG/2ML IJ SOLN
INTRAMUSCULAR | Status: DC | PRN
  Administered 2021-10-27 (×2): 50 ug via INTRAVENOUS
  Administered 2021-10-27: 100 ug via INTRAVENOUS

## 2021-10-27 MED ORDER — PHENOL 1.4 % MT LIQD: 1.0000 | OROMUCOSAL | Status: DC | PRN

## 2021-10-27 MED ORDER — CHLORHEXIDINE GLUCONATE 0.12 % MT SOLN
15.0000 mL | Freq: Once | OROMUCOSAL | Status: AC
  Administered 2021-10-27: 15 mL via OROMUCOSAL
  Filled 2021-10-27: qty 15

## 2021-10-27 MED ORDER — DEXAMETHASONE SODIUM PHOSPHATE 10 MG/ML IJ SOLN
INTRAMUSCULAR | Status: DC | PRN
  Administered 2021-10-27: 4 mg via INTRAVENOUS

## 2021-10-27 MED ORDER — ONDANSETRON HCL 4 MG PO TABS: 4.0000 mg | ORAL_TABLET | Freq: Four times a day (QID) | ORAL | Status: DC | PRN

## 2021-10-27 MED ORDER — TRANEXAMIC ACID-NACL 1000-0.7 MG/100ML-% IV SOLN
1000.0000 mg | Freq: Once | INTRAVENOUS | Status: AC
  Administered 2021-10-27: 1000 mg via INTRAVENOUS
  Filled 2021-10-27: qty 100

## 2021-10-27 MED ORDER — CEFAZOLIN SODIUM-DEXTROSE 2-4 GM/100ML-% IV SOLN
2.0000 g | Freq: Three times a day (TID) | INTRAVENOUS | Status: AC
  Administered 2021-10-27 – 2021-10-28 (×2): 2 g via INTRAVENOUS
  Filled 2021-10-27 (×2): qty 100

## 2021-10-27 MED ORDER — BUPIVACAINE HCL (PF) 0.25 % IJ SOLN
INTRAMUSCULAR | Status: AC
  Filled 2021-10-27: qty 30

## 2021-10-27 MED ORDER — TRANEXAMIC ACID-NACL 1000-0.7 MG/100ML-% IV SOLN
INTRAVENOUS | Status: DC | PRN
  Administered 2021-10-27: 1000 mg via INTRAVENOUS

## 2021-10-27 MED ORDER — ACETAMINOPHEN 500 MG PO TABS
1000.0000 mg | ORAL_TABLET | Freq: Four times a day (QID) | ORAL | Status: AC
  Administered 2021-10-27 – 2021-10-28 (×4): 1000 mg via ORAL
  Filled 2021-10-27 (×4): qty 2

## 2021-10-27 MED ORDER — METOCLOPRAMIDE HCL 5 MG/ML IJ SOLN: 5.0000 mg | Freq: Three times a day (TID) | INTRAMUSCULAR | Status: DC | PRN

## 2021-10-27 MED ORDER — ONDANSETRON HCL 4 MG/2ML IJ SOLN
INTRAMUSCULAR | Status: AC
  Filled 2021-10-27: qty 2

## 2021-10-27 MED ORDER — HYDROMORPHONE HCL 1 MG/ML IJ SOLN: 0.2500 mg | INTRAMUSCULAR | Status: DC | PRN

## 2021-10-27 MED ORDER — LIDOCAINE 2% (20 MG/ML) 5 ML SYRINGE
INTRAMUSCULAR | Status: DC | PRN
  Administered 2021-10-27: 70 mg via INTRAVENOUS

## 2021-10-27 MED ORDER — METHOCARBAMOL 500 MG PO TABS
500.0000 mg | ORAL_TABLET | Freq: Four times a day (QID) | ORAL | Status: DC | PRN
  Administered 2021-10-28 – 2021-10-29 (×4): 500 mg via ORAL
  Filled 2021-10-27 (×4): qty 1

## 2021-10-27 MED ORDER — ACETAMINOPHEN 325 MG PO TABS: 325.0000 mg | ORAL_TABLET | Freq: Four times a day (QID) | ORAL | Status: DC | PRN

## 2021-10-27 MED ORDER — OXYCODONE HCL 5 MG PO TABS
5.0000 mg | ORAL_TABLET | ORAL | Status: DC | PRN
  Administered 2021-10-28 – 2021-10-30 (×6): 10 mg via ORAL
  Filled 2021-10-27 (×6): qty 2

## 2021-10-27 MED ORDER — LIDOCAINE 2% (20 MG/ML) 5 ML SYRINGE
INTRAMUSCULAR | Status: AC
  Filled 2021-10-27: qty 5

## 2021-10-27 MED ORDER — INSULIN DETEMIR 100 UNIT/ML ~~LOC~~ SOLN
10.0000 [IU] | Freq: Two times a day (BID) | SUBCUTANEOUS | Status: DC
  Filled 2021-10-27 (×2): qty 0.1

## 2021-10-27 MED ORDER — PANTOPRAZOLE SODIUM 40 MG IV SOLR
40.0000 mg | INTRAVENOUS | Status: DC
  Administered 2021-10-27 – 2021-10-28 (×2): 40 mg via INTRAVENOUS
  Filled 2021-10-27 (×2): qty 10

## 2021-10-27 MED ORDER — MIDAZOLAM HCL 2 MG/2ML IJ SOLN
INTRAMUSCULAR | Status: AC
  Filled 2021-10-27: qty 2

## 2021-10-27 MED ORDER — PHENYLEPHRINE 80 MCG/ML (10ML) SYRINGE FOR IV PUSH (FOR BLOOD PRESSURE SUPPORT)
PREFILLED_SYRINGE | INTRAVENOUS | Status: AC
  Filled 2021-10-27: qty 30

## 2021-10-27 MED ORDER — INSULIN ASPART 100 UNIT/ML IJ SOLN: 0.0000 [IU] | Freq: Four times a day (QID) | INTRAMUSCULAR | Status: DC

## 2021-10-27 MED ORDER — ROCURONIUM BROMIDE 10 MG/ML (PF) SYRINGE
PREFILLED_SYRINGE | INTRAVENOUS | Status: AC
  Filled 2021-10-27: qty 10

## 2021-10-27 MED ORDER — MENTHOL 3 MG MT LOZG: 1.0000 | LOZENGE | OROMUCOSAL | Status: DC | PRN

## 2021-10-27 MED ORDER — INSULIN DETEMIR 100 UNIT/ML ~~LOC~~ SOLN
5.0000 [IU] | Freq: Two times a day (BID) | SUBCUTANEOUS | Status: DC
  Administered 2021-10-27 – 2021-10-28 (×4): 5 [IU] via SUBCUTANEOUS
  Filled 2021-10-27 (×6): qty 0.05

## 2021-10-27 MED ORDER — CEFAZOLIN SODIUM-DEXTROSE 2-4 GM/100ML-% IV SOLN
2.0000 g | INTRAVENOUS | Status: AC
  Administered 2021-10-27: 2 g via INTRAVENOUS
  Filled 2021-10-27: qty 100

## 2021-10-27 MED ORDER — PHENYLEPHRINE 80 MCG/ML (10ML) SYRINGE FOR IV PUSH (FOR BLOOD PRESSURE SUPPORT)
PREFILLED_SYRINGE | INTRAVENOUS | Status: DC | PRN
  Administered 2021-10-27: 80 ug via INTRAVENOUS

## 2021-10-27 MED ORDER — MORPHINE SULFATE 4 MG/ML IJ SOLN
INTRAMUSCULAR | Status: DC | PRN
  Administered 2021-10-27: 33 mL

## 2021-10-27 MED ORDER — PROMETHAZINE HCL 25 MG/ML IJ SOLN: 6.2500 mg | INTRAMUSCULAR | Status: DC | PRN

## 2021-10-27 MED ORDER — ONDANSETRON HCL 4 MG/2ML IJ SOLN
INTRAMUSCULAR | Status: DC | PRN
  Administered 2021-10-27: 4 mg via INTRAVENOUS

## 2021-10-27 MED ORDER — ROCURONIUM BROMIDE 10 MG/ML (PF) SYRINGE
PREFILLED_SYRINGE | INTRAVENOUS | Status: DC | PRN
  Administered 2021-10-27 (×2): 10 mg via INTRAVENOUS
  Administered 2021-10-27: 60 mg via INTRAVENOUS
  Administered 2021-10-27: 20 mg via INTRAVENOUS

## 2021-10-27 MED ORDER — INSULIN ASPART 100 UNIT/ML IJ SOLN
0.0000 [IU] | INTRAMUSCULAR | Status: DC
  Administered 2021-10-27 (×2): 3 [IU] via SUBCUTANEOUS
  Administered 2021-10-27: 8 [IU] via SUBCUTANEOUS
  Administered 2021-10-28: 3 [IU] via SUBCUTANEOUS
  Administered 2021-10-28: 2 [IU] via SUBCUTANEOUS
  Administered 2021-10-28: 3 [IU] via SUBCUTANEOUS

## 2021-10-27 MED ORDER — PROPOFOL 10 MG/ML IV BOLUS
INTRAVENOUS | Status: DC | PRN
  Administered 2021-10-27: 90 mg via INTRAVENOUS

## 2021-10-27 MED ORDER — POVIDONE-IODINE 10 % EX SWAB
2.0000 | Freq: Once | CUTANEOUS | Status: AC
  Administered 2021-10-27: 2 via TOPICAL

## 2021-10-27 MED ORDER — PHENYLEPHRINE HCL (PRESSORS) 10 MG/ML IV SOLN: INTRAVENOUS | Status: DC | PRN

## 2021-10-27 MED ORDER — HYDROMORPHONE HCL 1 MG/ML IJ SOLN
0.5000 mg | INTRAMUSCULAR | Status: DC | PRN
  Administered 2021-10-28: 0.5 mg via INTRAVENOUS

## 2021-10-27 MED ORDER — CLONIDINE HCL (ANALGESIA) 100 MCG/ML EP SOLN
EPIDURAL | Status: AC
  Filled 2021-10-27: qty 10

## 2021-10-27 MED ORDER — METOCLOPRAMIDE HCL 5 MG PO TABS: 5.0000 mg | ORAL_TABLET | Freq: Three times a day (TID) | ORAL | Status: DC | PRN

## 2021-10-27 MED ORDER — TRANEXAMIC ACID-NACL 1000-0.7 MG/100ML-% IV SOLN
INTRAVENOUS | Status: AC
  Filled 2021-10-27: qty 100

## 2021-10-27 MED ORDER — ORAL CARE MOUTH RINSE: 15.0000 mL | Freq: Once | OROMUCOSAL | Status: AC

## 2021-10-27 MED ORDER — MORPHINE SULFATE (PF) 4 MG/ML IV SOLN
INTRAVENOUS | Status: AC
  Filled 2021-10-27: qty 2

## 2021-10-27 MED ORDER — DOCUSATE SODIUM 100 MG PO CAPS
100.0000 mg | ORAL_CAPSULE | Freq: Two times a day (BID) | ORAL | Status: DC
  Administered 2021-10-27 – 2021-10-30 (×6): 100 mg via ORAL
  Filled 2021-10-27 (×6): qty 1

## 2021-10-27 MED ORDER — INSULIN ASPART 100 UNIT/ML IJ SOLN: 0.0000 [IU] | INTRAMUSCULAR | Status: DC | PRN

## 2021-10-27 MED ORDER — FENTANYL CITRATE (PF) 100 MCG/2ML IJ SOLN
INTRAMUSCULAR | Status: AC
  Filled 2021-10-27: qty 2

## 2021-10-27 SURGICAL SUPPLY — 61 items
BAG COUNTER SPONGE SURGICOUNT (BAG) ×2 IMPLANT
BAG SPNG CNTER NS LX DISP (BAG) ×2
BIT DRILL INTERTAN LAG SCREW (BIT) IMPLANT
BIT DRILL SHORT 4.0 (BIT) IMPLANT
BLADE CLIPPER SURG (BLADE) IMPLANT
BLADE SURG 15 STRL LF DISP TIS (BLADE) ×2 IMPLANT
BLADE SURG 15 STRL SS (BLADE) ×2
CABLE 1.7 (Orthopedic Implant) IMPLANT
CABLE CERLAGE W/CRIMP 1.8 (Cable) IMPLANT
COVER BACK TABLE 60X90IN (DRAPES) IMPLANT
COVER PERINEAL POST (MISCELLANEOUS) ×2 IMPLANT
COVER SURGICAL LIGHT HANDLE (MISCELLANEOUS) ×2 IMPLANT
DRAPE HALF SHEET 40X57 (DRAPES) IMPLANT
DRAPE ORTHO SPLIT 77X108 STRL (DRAPES) ×4
DRAPE STERI IOBAN 125X83 (DRAPES) ×2 IMPLANT
DRAPE SURG ORHT 6 SPLT 77X108 (DRAPES) IMPLANT
DRESSING AQUACEL AG SP 3.5X6 (GAUZE/BANDAGES/DRESSINGS) IMPLANT
DRESSING MEPILEX FLEX 4X4 (GAUZE/BANDAGES/DRESSINGS) IMPLANT
DRILL BIT SHORT 4.0 (BIT) ×2
DRSG AQUACEL AG 3.5X4 (GAUZE/BANDAGES/DRESSINGS) IMPLANT
DRSG AQUACEL AG SP 3.5X6 (GAUZE/BANDAGES/DRESSINGS) ×2
DRSG MEPILEX BORDER 4X4 (GAUZE/BANDAGES/DRESSINGS) IMPLANT
DRSG MEPILEX BORDER 4X8 (GAUZE/BANDAGES/DRESSINGS) ×2 IMPLANT
DRSG MEPILEX FLEX 4X4 (GAUZE/BANDAGES/DRESSINGS) ×2
DURAPREP 26ML APPLICATOR (WOUND CARE) ×2 IMPLANT
ELECT REM PT RETURN 9FT ADLT (ELECTROSURGICAL) ×2
ELECTRODE REM PT RTRN 9FT ADLT (ELECTROSURGICAL) ×2 IMPLANT
FACESHIELD WRAPAROUND (MASK) ×2 IMPLANT
FACESHIELD WRAPAROUND OR TEAM (MASK) ×2 IMPLANT
GAUZE XEROFORM 5X9 LF (GAUZE/BANDAGES/DRESSINGS) ×2 IMPLANT
GLOVE BIOGEL PI IND STRL 8 (GLOVE) ×2 IMPLANT
GLOVE ECLIPSE 8.0 STRL XLNG CF (GLOVE) ×2 IMPLANT
GOWN STRL REUS W/ TWL LRG LVL3 (GOWN DISPOSABLE) ×2 IMPLANT
GOWN STRL REUS W/ TWL XL LVL3 (GOWN DISPOSABLE) IMPLANT
GOWN STRL REUS W/TWL LRG LVL3 (GOWN DISPOSABLE) ×2
GOWN STRL REUS W/TWL XL LVL3 (GOWN DISPOSABLE)
GUIDE PIN 3.2X343 (PIN) ×6
GUIDE PIN 3.2X343MM (PIN) ×6
GUIDE ROD 3.0 (MISCELLANEOUS) ×2
KIT BASIN OR (CUSTOM PROCEDURE TRAY) ×2 IMPLANT
KIT TURNOVER KIT B (KITS) ×2 IMPLANT
MANIFOLD NEPTUNE II (INSTRUMENTS) ×2 IMPLANT
NAIL TRIGEN 10MMX40CM-125 LEFT (Nail) IMPLANT
NS IRRIG 1000ML POUR BTL (IV SOLUTION) ×2 IMPLANT
PACK GENERAL/GYN (CUSTOM PROCEDURE TRAY) ×2 IMPLANT
PAD ARMBOARD 7.5X6 YLW CONV (MISCELLANEOUS) ×4 IMPLANT
PIN GUIDE 3.2X343MM (PIN) IMPLANT
ROD GUIDE 3.0 (MISCELLANEOUS) IMPLANT
SCREW LAG COMPR KIT 90/85 (Screw) IMPLANT
SCREW TRIGEN LOW PROF 5.0X35 (Screw) IMPLANT
SPONGE T-LAP 4X18 ~~LOC~~+RFID (SPONGE) IMPLANT
STAPLER VISISTAT 35W (STAPLE) ×2 IMPLANT
SUT ETHILON 2 0 FS 18 (SUTURE) IMPLANT
SUT ETHILON 3 0 PS 1 (SUTURE) IMPLANT
SUT VIC AB 2-0 CT1 27 (SUTURE) ×6
SUT VIC AB 2-0 CT1 TAPERPNT 27 (SUTURE) IMPLANT
SUT VIC AB 2-0 CTB1 (SUTURE) ×2 IMPLANT
TAPE STRIPS DRAPE STRL (GAUZE/BANDAGES/DRESSINGS) IMPLANT
TOWEL GREEN STERILE (TOWEL DISPOSABLE) ×2 IMPLANT
TOWEL GREEN STERILE FF (TOWEL DISPOSABLE) ×2 IMPLANT
WATER STERILE IRR 1000ML POUR (IV SOLUTION) ×2 IMPLANT

## 2021-10-27 NOTE — Consult Note (Signed)
Reason for Consult: Left hip pain  Referring Physician: Dr. Rushie Chestnut is an 66 y.o. male.  HPI: Patient presents following mechanical fall yesterday affecting his left hip.  Denies any loss of consciousness.  Radiographs demonstrate subtrochanteric/intertrochanteric femur fracture.  He has had a below-knee amputation on that side.  He does ambulate with the use of a prosthesis.  Denies any other cardiac or significant medical history.  Does have diabetes.  Does have some family at home.  Past Medical History:  Diagnosis Date   Abnormal rate of speech    post stroke   Arthritis of left shoulder region    Chronic constipation    Chronic kidney disease    chronic kidney disease    Cognitive deficit due to old cerebral infarction    DDD (degenerative disc disease), cervical    Degenerative cervical spinal stenosis    Gait disturbance, post-stroke    uses cane/weelchair   History of ischemic left MCA stroke 06/27/2013   w/ evidence hemorrhagia, high grade stenosis bilateral proximal MCA and extensive small matter disease--- symptoms resolved, no residual    History of ischemic stroke without residual deficits 05-14-2015---  residual left hemiparesis and cognitive deficits   small nonhemorrhegic infarct right corona radiata posterior limb and progressive right m2 stenosis   Hyperlipidemia    Hypertension    Insomnia    Internal hemorrhoids    Left hemiparesis (HCC)    RESIDUAL FROM STROKE--  WALKS W/ CANE   Lower GI bleed    Mild atherosclerosis of carotid artery, right    RICA 0-39% per duplex 06-28-2013   Neuromuscular disorder (HCC)    neuropathy - feet   OA (osteoarthritis)    LEFT SHOULDER   Painful bladder spasm    Peripheral arterial disease ([place])    Prostate cancer [facility]) urologist-  dr Gaynelle Arabian  oncologist-  dr [name]   dx 12/ 2017-- Stage T1c, Gleason 3+4,  PSA 10.4,  vol 51cc   Sensorineural hearing loss, bilateral    does not wear hearing aids    Stroke [facility])    Type 2 diabetes mellitus treated with insulin ([place])    Urge incontinence    w frequency, wears depends   Urinary retention    Previously required indwelling Foley   Wears dentures     Past Surgical History:  Procedure Laterality Date   ABDOMINAL AORTOGRAM W/LOWER EXTREMITY N/A 12/11/2018   Procedure: ABDOMINAL AORTOGRAM W/LOWER EXTREMITY;  Surgeon: Angelia Mould, MD;  Location: [place] [facility];  Service: Cardiovascular;  Laterality: N/A;   ABDOMINAL AORTOGRAM W/LOWER EXTREMITY Bilateral 04/02/2019   Procedure: ABDOMINAL AORTOGRAM W/LOWER EXTREMITY;  Surgeon: Angelia Mould, MD;  Location: [place] [facility];  Service: Cardiovascular;  Laterality: Bilateral;   AMPUTATION Left 04/28/2019   Procedure: LEFT BELOW KNEE AMPUTATION;  Surgeon: Newt Minion, MD;  Location: [place];  Service: Orthopedics;  Laterality: Left;   ANTERIOR CERVICAL DECOMP/DISCECTOMY FUSION  11/27/1999   C5 -- C6   BELOW KNEE LEG AMPUTATION Left 04/28/2019   BIOPSY  05/21/2019   Procedure: BIOPSY;  Surgeon: Doran Stabler, MD;  Location: [place];  Service: Gastroenterology;;   BIOPSY  12/20/2019   Procedure: BIOPSY;  Surgeon: Irving Copas., MD;  Location: [place];  Service: Gastroenterology;;   BIOPSY  02/19/2021   Procedure: BIOPSY;  Surgeon: Irving Copas., MD;  [facility];  Service: Gastroenterology;;   Denton Meek  10/07/2001   COLONOSCOPY WITH PROPOFOL  N/A 05/21/2019   Procedure: COLONOSCOPY WITH PROPOFOL;  Surgeon: Doran Stabler, MD;  Location: [place];  Service: Gastroenterology;  Laterality: N/A;   COLONOSCOPY WITH PROPOFOL N/A 12/20/2019   Procedure: COLONOSCOPY WITH PROPOFOL;  Surgeon: Rush Landmark Telford Nab., MD;  Location: [place];  Service: Gastroenterology;  Laterality: N/A;   COLONOSCOPY WITH PROPOFOL N/A 02/19/2021   Procedure: COLONOSCOPY WITH PROPOFOL;  Surgeon: Rush Landmark Telford Nab., MD;  [facility];  Service: Gastroenterology;  Laterality: N/A;   CYSTOSCOPY WITH INSERTION OF UROLIFT N/A 08/11/2017   Procedure: CYSTOSCOPY WITH INSERTION OF UROLIFT;  Surgeon: Cleon Gustin, MD;  [facility];  Service: Urology;  Laterality: N/A;   ENDOSCOPIC MUCOSAL RESECTION N/A 12/20/2019   Procedure: ENDOSCOPIC MUCOSAL RESECTION;  Surgeon: Rush Landmark Telford Nab., MD;  Location: [place];  Service: Gastroenterology;  Laterality: N/A;   ENDOSCOPIC MUCOSAL RESECTION N/A 02/19/2021   Procedure: ENDOSCOPIC MUCOSAL RESECTION;  Surgeon: Rush Landmark Telford Nab., MD;  [facility];  Service: Gastroenterology;  Laterality: N/A;   ESOPHAGOGASTRODUODENOSCOPY N/A 02/19/2021   Procedure: ESOPHAGOGASTRODUODENOSCOPY (EGD);  Surgeon: Irving Copas., MD;  Location[facility];  Service: Gastroenterology;  Laterality: N/A;   ESOPHAGOGASTRODUODENOSCOPY (EGD) WITH PROPOFOL N/A 12/20/2019   Procedure: ESOPHAGOGASTRODUODENOSCOPY (EGD) WITH PROPOFOL;  Surgeon: Rush Landmark Telford Nab., MD;  Location: [place];  Service: Gastroenterology;  Laterality: N/A;   FLEXIBLE SIGMOIDOSCOPY N/A 05/23/2019   Procedure: FLEXIBLE SIGMOIDOSCOPY;  Surgeon: Doran Stabler, MD;  Location: [place];  Service: Gastroenterology;  Laterality: N/A;   FLEXIBLE SIGMOIDOSCOPY N/A 05/26/2019   Procedure: FLEXIBLE SIGMOIDOSCOPY;  Surgeon: Milus Banister, MD;  Location: [facility];  Service: Endoscopy;  Laterality: N/A;   FOOT SURGERY  2005   HEMOSTASIS CLIP PLACEMENT  05/23/2019   Procedure: HEMOSTASIS CLIP PLACEMENT;  Surgeon: Doran Stabler, MD;  Location: [place];  Service: Gastroenterology;;   HEMOSTASIS CLIP PLACEMENT  05/26/2019   Procedure: HEMOSTASIS CLIP PLACEMENT;  Surgeon: Milus Banister, MD;  Location: [place];  Service: Endoscopy;;   HEMOSTASIS CLIP PLACEMENT  12/20/2019   Procedure: HEMOSTASIS CLIP PLACEMENT;  Surgeon: Irving Copas., MD;  Location: [place];   Service: Gastroenterology;;   HEMOSTASIS CLIP PLACEMENT  02/19/2021   Procedure: HEMOSTASIS CLIP PLACEMENT;  Surgeon: Irving Copas., MD;  Location[facility];  Service: Gastroenterology;;   PERIPHERAL VASCULAR BALLOON ANGIOPLASTY  12/11/2018   Procedure: PERIPHERAL VASCULAR BALLOON ANGIOPLASTY;  Surgeon: Angelia Mould, MD;  Location: [place] [facility];  Service: Cardiovascular;;  Lt. SFA   POLYPECTOMY  12/20/2019   Procedure: POLYPECTOMY;  Surgeon: Rush Landmark Telford Nab., MD;  Location: [place];  Service: Gastroenterology;;   POLYPECTOMY  02/19/2021   Procedure: POLYPECTOMY;  Surgeon: Irving Copas., MD;  [facility];  Service: Gastroenterology;;   PROSTATE BIOPSY  12/12/2015   RADIOACTIVE SEED IMPLANT N/A 05/30/2016   Procedure: RADIOACTIVE SEED IMPLANT/BRACHYTHERAPY IMPLANT WITH SPACE OAR;  Surgeon: Carolan Clines, MD;  Location[facility];  Service: Urology;  Laterality: N/A;   SHOULDER ARTHROSCOPY WITH SUBACROMIAL DECOMPRESSION Left 11/28/2006   Debridement/ Acromioplasty/  CA ligament relesase   SUBMUCOSAL LIFTING INJECTION  12/20/2019   Procedure: SUBMUCOSAL LIFTING INJECTION;  Surgeon: Irving Copas., MD;  Location: [place];  Service: Gastroenterology;;   SUBMUCOSAL LIFTING INJECTION  02/19/2021   Procedure: SUBMUCOSAL LIFTING INJECTION;  Surgeon: Irving Copas., MD;  [facility];  Service: Gastroenterology;;   TRANSTHORACIC ECHOCARDIOGRAM  05/15/2015   mild LVH,  ef 65-70%, codominant mitral  inflow-iindeterminate diastolic function/      Family History  Problem Relation Age of Onset   Hypertension Mother    Hypertension Father    Cancer Brother        prostate    Social History:  reports that he has quit smoking. His smoking use included cigarettes. He has a 11.25 pack-year smoking history. He has never used smokeless tobacco. He reports that he does not drink alcohol and does not  use drugs.  Allergies: No Known Allergies  Medications: I have reviewed the patient's current medications.  Results for orders placed or performed during the hospital encounter of 10/26/21 (from the past 48 hour(s))  CBC with Differential     Status: Abnormal   Collection Time: 10/26/21  7:54 PM  Result Value Ref Range   WBC 11.8 (H) 4.0 - 10.5 K/uL   RBC 4.78 4.22 - 5.81 MIL/uL   Hemoglobin 13.7 13.0 - 17.0 g/dL   HCT 41.4 39.0 - 52.0 %   MCV 86.6 80.0 - 100.0 fL   MCH 28.7 26.0 - 34.0 pg   MCHC 33.1 30.0 - 36.0 g/dL   RDW 11.9 11.5 - 15.5 %   Platelets 222 150 - 400 K/uL   nRBC 0.0 0.0 - 0.2 %   Neutrophils Relative % 82 %   Neutro Abs 9.6 (H) 1.7 - 7.7 K/uL   Lymphocytes Relative 13 %   Lymphs Abs 1.5 0.7 - 4.0 K/uL   Monocytes Relative 5 %   Monocytes Absolute 0.6 0.1 - 1.0 K/uL   Eosinophils Relative 0 %   Eosinophils Absolute 0.1 0.0 - 0.5 K/uL   Basophils Relative 0 %   Basophils Absolute 0.0 0.0 - 0.1 K/uL   Immature Granulocytes 0 %   Abs Immature Granulocytes 0.05 0.00 - 0.07 K/uL    Comment: Performed at [facility], [address] [place], [place] [address]  Protime-INR     Status: None   Collection Time: 10/26/21  7:54 PM  Result Value Ref Range   Prothrombin Time 13.0 11.4 - 15.2 seconds   INR 1.0 0.8 - 1.2    Comment: (NOTE) INR goal varies based on device and disease states. Performed at [facility], [place] [address], [place], [place] [address]   Type and screen [place]     Status: None   Collection Time: 10/26/21  8:44 PM  Result Value Ref Range   ABO/RH(D) O POS    Antibody Screen NEG    Sample Expiration      10/29/2021,2359 Performed at [facility], [place] [address] [place],  [address]   Basic metabolic panel     Status: Abnormal   Collection Time: 10/26/21 10:00 PM  Result Value Ref Range   Sodium 136 135 - 145 mmol/L   Potassium 4.0 3.5 - 5.1 mmol/L   Chloride 104 98 - 111 mmol/L   CO2 24 22  - 32 mmol/L   Glucose, Bld 225 (H) 70 - 99 mg/dL    Comment: Glucose reference range applies only to samples taken after fasting for at least 8 hours.   BUN 15 8 - 23 mg/dL   Creatinine, Ser 1.21 0.61 - 1.24 mg/dL   Calcium 8.6 (L) 8.9 - 10.3 mg/dL   GFR, Estimated >60 >60 mL/min    Comment: (NOTE) Calculated using the CKD-EPI Creatinine Equation (2021)    Anion gap 8 5 - 15    Comment: Performed at  [facility], [place] [address], [place], [place] [address]  CBG monitoring, ED     Status: Abnormal   Collection Time: 10/27/21  2:59 AM  Result Value Ref Range   Glucose-Capillary 241 (H) 70 - 99 mg/dL    Comment: Glucose reference range applies only to samples taken after fasting for at least 8 hours.  Magnesium     Status: Abnormal   Collection Time: 10/27/21  5:27 AM  Result Value Ref Range   Magnesium 1.4 (L) 1.7 - 2.4 mg/dL    Comment: Performed at [facility] [address] [place], [place] [address]  Comprehensive metabolic panel     Status: Abnormal   Collection Time: 10/27/21  5:27 AM  Result Value Ref Range   Sodium 134 (L) 135 - 145 mmol/L   Potassium 4.2 3.5 - 5.1 mmol/L   Chloride 100 98 - 111 mmol/L   CO2 24 22 - 32 mmol/L   Glucose, Bld 253 (H) 70 - 99 mg/dL    Comment: Glucose reference range applies only to samples taken after fasting for at least 8 hours.   BUN 14 8 - 23 mg/dL   Creatinine, Ser 1.19 0.61 - 1.24 mg/dL   Calcium 8.7 (L) 8.9 - 10.3 mg/dL   Total Protein 6.6 6.5 - 8.1 g/dL   Albumin 3.4 (L) 3.5 - 5.0 g/dL   AST 27 15 - 41 U/L   ALT 29 0 - 44 U/L   Alkaline Phosphatase 62 38 - 126 U/L   Total Bilirubin 0.8 0.3 - 1.2 mg/dL   GFR, Estimated >60 >60 mL/min    Comment: (NOTE) Calculated using the CKD-EPI Creatinine Equation (2021)    Anion gap 10 5 - 15    Comment: Performed at [facility], [place] [address], [place], [place] [address]  CBC with Differential/Platelet     Status: Abnormal   Collection Time: 10/27/21  5:27 AM   Result Value Ref Range   WBC 13.4 (H) 4.0 - 10.5 K/uL   RBC 4.64 4.22 - 5.81 MIL/uL   Hemoglobin 13.0 13.0 - 17.0 g/dL   HCT 40.3 39.0 - 52.0 %   MCV 86.9 80.0 - 100.0 fL   MCH 28.0 26.0 - 34.0 pg   MCHC 32.3 30.0 - 36.0 g/dL   RDW 11.9 11.5 - 15.5 %   Platelets 228 150 - 400 K/uL   nRBC 0.0 0.0 - 0.2 %   Neutrophils Relative % 88 %   Neutro Abs 11.8 (H) 1.7 - 7.7 K/uL   Lymphocytes Relative 7 %   Lymphs Abs 0.9 0.7 - 4.0 K/uL   Monocytes Relative 5 %   Monocytes Absolute 0.7 0.1 - 1.0 K/uL   Eosinophils Relative 0 %   Eosinophils Absolute 0.0 0.0 - 0.5 K/uL   Basophils Relative 0 %   Basophils Absolute 0.0 0.0 - 0.1 K/uL   Immature Granulocytes 0 %   Abs Immature Granulocytes 0.04 0.00 - 0.07 K/uL    Comment: Performed at [facility] [address], [place], [place] [address]  Hemoglobin A1c     Status: Abnormal   Collection Time: 10/27/21  5:27 AM  Result Value Ref Range   Hgb A1c MFr Bld 10.6 (H) 4.8 - 5.6 %    Comment: (NOTE) Pre diabetes:          5.7%-6.4%  Diabetes:              >6.4%  Glycemic control for   <7.0% adults with diabetes    Mean Plasma Glucose 257.52 mg/dL    Comment: Performed at [facility] [address], [place], [place] [address]  VITAMIN D 25 Hydroxy (Vit-D Deficiency, Fractures)     Status: None   Collection Time: 10/27/21  5:27 AM  Result Value Ref Range   Vit D, 25-Hydroxy 46.84 30 - 100 ng/mL    Comment: (NOTE) Vitamin D deficiency has been defined by the Institute of Medicine  and an Endocrine Society practice guideline as a level of serum 25-OH  vitamin D less than 20 ng/mL (1,2). The Endocrine Society went on to  further define vitamin D insufficiency as a level between 21 and 29  ng/mL (2).  1. IOM (Institute of Medicine). 2010. Dietary reference intakes for  calcium and D. [place]: The [employer]. 2. Holick MF, Binkley Waldo, Bischoff-Ferrari HA, et al. Evaluation,  treatment, and prevention of  vitamin D deficiency: an Endocrine  Society clinical practice guideline, JCEM. 2011 Jul; 96(7): 1911-30.  Performed at [facility], [place] [address] [place], [place] [address]   CBG monitoring, ED     Status: Abnormal   Collection Time: 10/27/21  6:58 AM  Result Value Ref Range   Glucose-Capillary 252 (H) 70 - 99 mg/dL    Comment: Glucose reference range applies only to samples taken after fasting for at least 8 hours.    DG FEMUR MIN 2 VIEWS LEFT  Result Date: 10/26/2021 CLINICAL DATA:  Left hip pain after a fall. History of below the knee amputation. EXAM: LEFT FEMUR 2 VIEWS; PELVIS - 1-2 VIEW COMPARISON:  None Available. FINDINGS: Degenerative changes in the lower lumbar spine and hips. Pelvis appears intact. There is an acute comminuted inter trochanteric and subtrochanteric fracture of the proximal left femur with varus angulation of the fracture fragments. Mildly displaced lesser trochanteric fragment. No dislocation at the hip joint. Seed implants are seen in the prostate gland. Vascular calcifications. The midshaft and distal femur are unremarkable. Degenerative changes in the knee joint. IMPRESSION: Acute comminuted inter trochanteric and subtrochanteric fracture of the proximal left femur with varus angulation. Electronically Signed   By: Lucienne Capers M.D.   On: 10/26/2021 22:00   DG Pelvis 1-2 Views  Result Date: 10/26/2021 CLINICAL DATA:  Left hip pain after a fall. History of below the knee amputation. EXAM: LEFT FEMUR 2 VIEWS; PELVIS - 1-2 VIEW COMPARISON:  None Available. FINDINGS: Degenerative changes in the lower lumbar spine and hips. Pelvis appears intact. There is an acute comminuted inter trochanteric and subtrochanteric fracture of the proximal left femur with varus angulation of the fracture fragments. Mildly displaced lesser trochanteric fragment. No dislocation at the hip joint. Seed implants are seen in the prostate gland. Vascular calcifications. The midshaft  and distal femur are unremarkable. Degenerative changes in the knee joint. IMPRESSION: Acute comminuted inter trochanteric and subtrochanteric fracture of the proximal left femur with varus angulation. Electronically Signed   By: Lucienne Capers M.D.   On: 10/26/2021 22:00    Review of Systems  Musculoskeletal:  Positive for arthralgias.  All other systems reviewed and are negative.  Blood pressure (!) 149/76, pulse 89, temperature 98.5 F (36.9 C), resp. rate 16, SpO2 97 %. Physical Exam Vitals reviewed.  HENT:     Head: Normocephalic.     Nose: Nose normal.     Mouth/Throat:     Mouth: Mucous membranes are moist.  Eyes:  Pupils: Pupils are equal, round, and reactive to light.  Cardiovascular:     Rate and Rhythm: Normal rate.     Pulses: Normal pulses.  Pulmonary:     Effort: Pulmonary effort is normal.  Abdominal:     General: Abdomen is flat.  Musculoskeletal:     Cervical back: Normal range of motion.  Skin:    General: Skin is warm.     Capillary Refill: Capillary refill takes less than 2 seconds.  Neurological:     General: No focal deficit present.     Mental Status: He is alert.  Psychiatric:        Mood and Affect: Mood normal.   Examination of bilateral upper extremities demonstrates good range of motion of the wrist elbows and shoulders.  Right lower extremity has good range of motion of ankle hip and knee.  Left BKA is present.  Stump well-healed.  Pain with range of motion is present.  Some swelling is also noted.  Skin is intact in the left hip region.  Assessment/Plan: Impression is left hip intertrochanteric/subtrochanteric femur fracture in an ambulatory patient who has a prosthesis.  Plan is intramedullary nail fixation of the fracture.  The risk and benefits of the procedure are discussed with the patient including but not limited to infection nerve vessel damage nonunion malunion potential need for more surgery as well as potential for delayed  weightbearing.  Patient understands the risk and benefits and wishes to proceed.  All questions answered.  Anticipate using a traction pin and traction bow in order to facilitate traction and rotation for this fracture. Landry Dyke Azariah Latendresse 10/27/2021, 9:04 AM

## 2021-10-27 NOTE — Brief Op Note (Signed)
   10/27/2021  5:48 PM  PATIENT:  Billy Torres  66 y.o. male  PRE-OPERATIVE DIAGNOSIS:  Left intertrochanteric fracture  POST-OPERATIVE DIAGNOSIS:  Left intertrochanteric fracture  PROCEDURE:  Procedure(s): INTRAMEDULLARY (IM) NAIL INTERTROCHANTERIC INSERTION AND REMOVAL OF TRACTION PIN  SURGEON:  Surgeon(s): Meredith Pel, MD  ASSISTANT: magnant pa  ANESTHESIA:   general  EBL: 150 ml    Total I/O In: 950 [I.V.:850; IV Piggyback:100] Out: 300 [Blood:300]  BLOOD ADMINISTERED: none  DRAINS: none   LOCAL MEDICATIONS USED:  marcaine mso4 clonidine  SPECIMEN:  No Specimen  COUNTS:  YES  TOURNIQUET:  * No tourniquets in log *  DICTATION: .Other Dictation: Dictation Number done  PLAN OF CARE: Admit to inpatient   PATIENT DISPOSITION:  PACU - hemodynamically stable

## 2021-10-27 NOTE — Anesthesia Postprocedure Evaluation (Signed)
Anesthesia Post Note  Patient: Billy Torres  Procedure(s) Performed: INTRAMEDULLARY (IM) NAIL INTERTROCHANTERIC (Left) INSERTION AND REMOVAL OF TRACTION PIN (Left: Leg Lower)     Patient location during evaluation: PACU Anesthesia Type: MAC Level of consciousness: awake and alert Pain management: pain level controlled Vital Signs Assessment: post-procedure vital signs reviewed and stable Respiratory status: spontaneous breathing Cardiovascular status: stable Anesthetic complications: no   No notable events documented.  Last Vitals:  Vitals:   10/27/21 1905 10/27/21 1951  BP: 133/74 138/74  Pulse: 88 86  Resp: 18 17  Temp: 36.9 C 36.4 C  SpO2: 95% 99%    Last Pain:  Vitals:   10/27/21 1951  TempSrc: Oral  PainSc:                  Nolon Nations

## 2021-10-27 NOTE — Transfer of Care (Signed)
Immediate Anesthesia Transfer of Care Note  Patient: Billy Torres  Procedure(s) Performed: INTRAMEDULLARY (IM) NAIL INTERTROCHANTERIC (Left) INSERTION AND REMOVAL OF TRACTION PIN (Left: Leg Lower)  Patient Location: PACU  Anesthesia Type:General  Level of Consciousness: drowsy and patient cooperative  Airway & Oxygen Therapy: Patient Spontanous Breathing and Patient connected to nasal cannula oxygen  Post-op Assessment: Report given to RN and Post -op Vital signs reviewed and stable  Post vital signs: Reviewed and stable  Last Vitals:  Vitals Value Taken Time  BP    Temp    Pulse    Resp    SpO2      Last Pain:  Vitals:   10/27/21 1258  TempSrc: Oral  PainSc: Asleep         Complications: No notable events documented.

## 2021-10-27 NOTE — Op Note (Unsigned)
NAME: [name], [name] MEDICAL RECORD NO: [id number] ACCOUNT NO: [id number] DATE OF BIRTH: 1955-09-27 FACILITY: [facility] LOCATION: [facility] PHYSICIAN: Yetta Barre. Marlou Sa, MD  Operative Report   DATE OF PROCEDURE: 10/27/2021  PREOPERATIVE DIAGNOSIS:  Left hip subtrochanteric femur fracture.  POSTOPERATIVE DIAGNOSIS:  Left hip subtrochanteric femur fracture.  PROCEDURE:  Left hip subtrochanteric femur fracture open reduction and internal fixation with cables and a Smith and Nephew 10 x 38 cm nail with application of traction pin for traction for reduction.  SURGEON:  Yetta Barre. Marlou Sa, MD  ASSISTANT:  Annie Main, PA.  INDICATIONS:  This is a 66 year old patient with a BKA, who presents for operative management of a subtrochanteric femur fracture after explanation of risks and benefits.  DESCRIPTION OF PROCEDURE:  The patient was brought to the operating room where general anesthetic was induced.  Preoperative antibiotics administered.  Timeout was called.  TXA was administered.  Left leg was prescrubbed with alcohol and Betadine,  allowed to air dry, prepped with DuraPrep solution and draped in sterile manner.  Ioban used to cover the operative field.  Timeout was called.  A 0.062 K-wire was placed through the epicondylar axis of the left knee.  Traction bow was applied.  This was  done under fluoroscopy.  Then, the leg was placed under tension by affixing it to the traction mechanism on the fracture table.  With traction applied, the proximal femur fracture was well reduced, confirmed in the AP and lateral planes.  At this time,  after sterile prepping and draping with DuraPrep solution wall drape was applied.  Incision made 4 fingerbreadths proximal to the tip of the trochanter.  Guide pin placed into the tip of the trochanter and into the proximal femur.  Prior to doing this;  however, an incision was made at the level of the inferior aspect of the subtrochanteric femur fracture.  Using careful  blunt dissection, two cables were passed at the distal aspect of the fracture in order to achieve anatomic alignment prior to reaming  and placing the nail. Through a separate proximal incision reaming was performed up to 12 mm and a 10 x 38 nail was placed.  A compression and lag screw were then placed through the cable incision.  Excellent compression was achieved.  One distal  interlocking screw was also placed under fluoroscopic guidance.  Overall, the fracture construct was stable.  Thorough irrigation was performed at all incisions.  Two proximal incisions were closed after numbing up with Marcaine, morphine, clonidine  using #1 Vicryl suture, 0 Vicryl suture, 2-0 Vicryl suture, and 3-0 Monocryl with Steri-Strips applied.  Distal incision closed using 3-0 nylon.  Impervious dressings applied. The traction K-wire was removed and dressings applied there.  The patient  tolerated the procedure well without immediate complication and transferred to recovery room in stable condition.  We will let him be partial weightbearing as tolerated on this construct.  Follow up with [place] in about 2 weeks for suture removal.  Luke's  assistance was required at all times for retraction, opening, closing, mobilization of tissues, his assistance was a medical necessity.   PUS D: 10/27/2021 5:53:53 pm T: 10/27/2021 6:25:00 pm  JOB: [id number]/ [id number]

## 2021-10-27 NOTE — ED Notes (Signed)
Surgeon came by.  Consent signed.  Likely surgery this afternoon

## 2021-10-27 NOTE — Anesthesia Procedure Notes (Signed)
Procedure Name: Intubation Date/Time: 10/27/2021 3:01 PM  Performed by: Moshe Salisbury, CRNAPre-anesthesia Checklist: Patient identified, Emergency Drugs available, Suction available and Patient being monitored Patient Re-evaluated:Patient Re-evaluated prior to induction Oxygen Delivery Method: Circle System Utilized Preoxygenation: Pre-oxygenation with 100% oxygen Induction Type: IV induction Ventilation: Mask ventilation without difficulty Laryngoscope Size: Mac and 4 Grade View: Grade II Tube type: Oral Tube size: 8.0 mm Number of attempts: 1 Airway Equipment and Method: Stylet Placement Confirmation: ETT inserted through vocal cords under direct vision, positive ETCO2 and breath sounds checked- equal and bilateral Secured at: 22 cm Tube secured with: Tape Dental Injury: Teeth and Oropharynx as per pre-operative assessment

## 2021-10-27 NOTE — ED Notes (Signed)
ED TO INPATIENT HANDOFF REPORT  ED Nurse Name and Phone #: Iona Coach Name/Age/Gender Billy Torres Sigel 66 y.o. male Room/Bed: 049C/049C  Code Status   Code Status: Full Code  Home/SNF/Other Home Patient oriented to: self, place, time, and situation Is this baseline? Yes   Triage Complete: Triage complete  Chief Complaint Closed left hip fracture ([place]) [S72.002A]  Triage Note No notes on file   Allergies No Known Allergies  Level of Care/Admitting Diagnosis ED Disposition     ED Disposition  Admit   Condition  --   [facility] Area: [place] [[id number]]  Level of Care: Med-Surg [16]  May admit patient to [facility] or [facility] if equivalent level of care is available:: No  Covid Evaluation: Asymptomatic - no recent exposure (last 10 days) testing not required  Diagnosis: Closed left hip fracture [facility]) [[id number]]  Admitting Physician: Rhetta Mura [[id number]]  Attending Physician: Rhetta Mura [[id number]]  Certification:: I certify this patient will need inpatient services for at least 2 midnights  Estimated Length of Stay: 2          B Medical/Surgery History Past Medical History:  Diagnosis Date   Abnormal rate of speech    post stroke   Arthritis of left shoulder region    Chronic constipation    Chronic kidney disease    chronic kidney disease    Cognitive deficit due to old cerebral infarction    DDD (degenerative disc disease), cervical    Degenerative cervical spinal stenosis    Gait disturbance, post-stroke    uses cane/weelchair   History of ischemic left MCA stroke 06/27/2013   w/ evidence hemorrhagia, high grade stenosis bilateral proximal MCA and extensive small matter disease--- symptoms resolved, no residual    History of ischemic stroke without residual deficits 05-14-2015---  residual left hemiparesis and cognitive deficits   small nonhemorrhegic infarct right corona radiata posterior limb and  progressive right m2 stenosis   Hyperlipidemia    Hypertension    Insomnia    Internal hemorrhoids    Left hemiparesis ([place])    RESIDUAL FROM STROKE--  WALKS W/ CANE   Lower GI bleed    Mild atherosclerosis of carotid artery, right    RICA 0-39% per duplex 06-28-2013   Neuromuscular disorder (HCC)    neuropathy - feet   OA (osteoarthritis)    LEFT SHOULDER   Painful bladder spasm    Peripheral arterial disease ([place])    Prostate cancer [facility]) urologist-  dr Gaynelle Arabian  oncologist-  dr [name]   dx 12/ 2017-- Stage T1c, Gleason 3+4,  PSA 10.4,  vol 51cc   Sensorineural hearing loss, bilateral    does not wear hearing aids   Stroke [facility])    Type 2 diabetes mellitus treated with insulin ([place])    Urge incontinence    w frequency, wears depends   Urinary retention    Previously required indwelling Foley   Wears dentures    Past Surgical History:  Procedure Laterality Date   ABDOMINAL AORTOGRAM W/LOWER EXTREMITY N/A 12/11/2018   Procedure: ABDOMINAL AORTOGRAM W/LOWER EXTREMITY;  Surgeon: Angelia Mould, MD;  Location: [place] [facility];  Service: Cardiovascular;  Laterality: N/A;   ABDOMINAL AORTOGRAM W/LOWER EXTREMITY Bilateral 04/02/2019   Procedure: ABDOMINAL AORTOGRAM W/LOWER EXTREMITY;  Surgeon: Angelia Mould, MD;  Location: [place] [facility];  Service: Cardiovascular;  Laterality: Bilateral;   AMPUTATION Left 04/28/2019   Procedure: LEFT BELOW KNEE AMPUTATION;  Surgeon: Newt Minion, MD;  Location: [place];  Service: Orthopedics;  Laterality: Left;   ANTERIOR CERVICAL DECOMP/DISCECTOMY FUSION  11/27/1999   C5 -- C6   BELOW KNEE LEG AMPUTATION Left 04/28/2019   BIOPSY  05/21/2019   Procedure: BIOPSY;  Surgeon: Doran Stabler, MD;  Location: [place];  Service: Gastroenterology;;   BIOPSY  12/20/2019   Procedure: BIOPSY;  Surgeon: Irving Copas., MD;  Location: [place];  Service: Gastroenterology;;   BIOPSY  02/19/2021   Procedure: BIOPSY;   Surgeon: Irving Copas., MD;  [facility];  Service: Gastroenterology;;   CIRCUMCISION  10/07/2001   COLONOSCOPY WITH PROPOFOL N/A 05/21/2019   Procedure: COLONOSCOPY WITH PROPOFOL;  Surgeon: Doran Stabler, MD;  Location: [place];  Service: Gastroenterology;  Laterality: N/A;   COLONOSCOPY WITH PROPOFOL N/A 12/20/2019   Procedure: COLONOSCOPY WITH PROPOFOL;  Surgeon: Rush Landmark Telford Nab., MD;  Location: [place];  Service: Gastroenterology;  Laterality: N/A;   COLONOSCOPY WITH PROPOFOL N/A 02/19/2021   Procedure: COLONOSCOPY WITH PROPOFOL;  Surgeon: Rush Landmark Telford Nab., MD;  [facility];  Service: Gastroenterology;  Laterality: N/A;   CYSTOSCOPY WITH INSERTION OF UROLIFT N/A 08/11/2017   Procedure: CYSTOSCOPY WITH INSERTION OF UROLIFT;  Surgeon: Cleon Gustin, MD;  [facility];  Service: Urology;  Laterality: N/A;   ENDOSCOPIC MUCOSAL RESECTION N/A 12/20/2019   Procedure: ENDOSCOPIC MUCOSAL RESECTION;  Surgeon: Rush Landmark Telford Nab., MD;  Location: [place];  Service: Gastroenterology;  Laterality: N/A;   ENDOSCOPIC MUCOSAL RESECTION N/A 02/19/2021   Procedure: ENDOSCOPIC MUCOSAL RESECTION;  Surgeon: Rush Landmark Telford Nab., MD;  [facility];  Service: Gastroenterology;  Laterality: N/A;   ESOPHAGOGASTRODUODENOSCOPY N/A 02/19/2021   Procedure: ESOPHAGOGASTRODUODENOSCOPY (EGD);  Surgeon: Irving Copas., MD;  [facility];  Service: Gastroenterology;  Laterality: N/A;   ESOPHAGOGASTRODUODENOSCOPY (EGD) WITH PROPOFOL N/A 12/20/2019   Procedure: ESOPHAGOGASTRODUODENOSCOPY (EGD) WITH PROPOFOL;  Surgeon: Rush Landmark Telford Nab., MD;  Location: [place];  Service: Gastroenterology;  Laterality: N/A;   FLEXIBLE SIGMOIDOSCOPY N/A 05/23/2019   Procedure: FLEXIBLE SIGMOIDOSCOPY;  Surgeon: Doran Stabler, MD;  Location: [place];  Service: Gastroenterology;  Laterality: N/A;   FLEXIBLE SIGMOIDOSCOPY N/A  05/26/2019   Procedure: FLEXIBLE SIGMOIDOSCOPY;  Surgeon: Milus Banister, MD;  Location: [facility];  Service: Endoscopy;  Laterality: N/A;   FOOT SURGERY  2005   HEMOSTASIS CLIP PLACEMENT  05/23/2019   Procedure: HEMOSTASIS CLIP PLACEMENT;  Surgeon: Doran Stabler, MD;  Location: [place];  Service: Gastroenterology;;   HEMOSTASIS CLIP PLACEMENT  05/26/2019   Procedure: HEMOSTASIS CLIP PLACEMENT;  Surgeon: Milus Banister, MD;  Location: [place];  Service: Endoscopy;;   HEMOSTASIS CLIP PLACEMENT  12/20/2019   Procedure: HEMOSTASIS CLIP PLACEMENT;  Surgeon: Irving Copas., MD;  Location: [place];  Service: Gastroenterology;;   HEMOSTASIS CLIP PLACEMENT  02/19/2021   Procedure: HEMOSTASIS CLIP PLACEMENT;  Surgeon: Irving Copas., MD;  [facility];  Service: Gastroenterology;;   PERIPHERAL VASCULAR BALLOON ANGIOPLASTY  12/11/2018   Procedure: PERIPHERAL VASCULAR BALLOON ANGIOPLASTY;  Surgeon: Angelia Mould, MD;  Location: [place] [facility];  Service: Cardiovascular;;  Lt. SFA   POLYPECTOMY  12/20/2019   Procedure: POLYPECTOMY;  Surgeon: Rush Landmark Telford Nab., MD;  Location: [place];  Service: Gastroenterology;;   POLYPECTOMY  02/19/2021   Procedure: POLYPECTOMY;  Surgeon: Irving Copas., MD;  [facility];  Service: Gastroenterology;;   PROSTATE BIOPSY  12/12/2015   RADIOACTIVE SEED IMPLANT  N/A 05/30/2016   Procedure: RADIOACTIVE SEED IMPLANT/BRACHYTHERAPY IMPLANT WITH SPACE OAR;  Surgeon: Carolan Clines, MD;  [facility];  Service: Urology;  Laterality: N/A;   SHOULDER ARTHROSCOPY WITH SUBACROMIAL DECOMPRESSION Left 11/28/2006   Debridement/ Acromioplasty/  CA ligament relesase   SUBMUCOSAL LIFTING INJECTION  12/20/2019   Procedure: SUBMUCOSAL LIFTING INJECTION;  Surgeon: Irving Copas., MD;  Location: [place];  Service: Gastroenterology;;   SUBMUCOSAL LIFTING INJECTION   02/19/2021   Procedure: SUBMUCOSAL LIFTING INJECTION;  Surgeon: Irving Copas., MD;  [facility];  Service: Gastroenterology;;   TRANSTHORACIC ECHOCARDIOGRAM  05/15/2015   mild LVH,  ef 65-70%, codominant mitral inflow-iindeterminate diastolic function/       A IV Location/Drains/Wounds Patient Lines/Drains/Airways Status     Active Line/Drains/Airways     Name Placement date Placement time Site Days   Peripheral IV 10/27/21 20 G 1.88" Left;Upper Arm 10/27/21  0435  Arm  less than 1   Incision (Closed) 04/28/19 Leg Left 04/28/19  1158  -- 913   Pressure Injury 05/20/19 Toe (Comment  which one) Right Deep Tissue Pressure Injury - Purple or maroon localized area of discolored intact skin or blood-filled blister due to damage of underlying soft tissue from pressure and/or shear. 05/20/19  1715  -- 891   Pressure Injury 05/20/19 Coccyx Medial Stage 1 -  Intact skin with non-blanchable redness of a localized area usually over a bony prominence. 05/20/19  1715  -- 891   Pressure Injury 05/27/19 Heel Right Deep Tissue Pressure Injury - Purple or maroon localized area of discolored intact skin or blood-filled blister due to damage of underlying soft tissue from pressure and/or shear. 05/27/19  0900  -- 884            Intake/Output Last 24 hours No intake or output data in the 24 hours ending 10/27/21 1301  Labs/Imaging Results for orders placed or performed during the hospital encounter of 10/26/21 (from the past 48 hour(s))  CBC with Differential     Status: Abnormal   Collection Time: 10/26/21  7:54 PM  Result Value Ref Range   WBC 11.8 (H) 4.0 - 10.5 K/uL   RBC 4.78 4.22 - 5.81 MIL/uL   Hemoglobin 13.7 13.0 - 17.0 g/dL   HCT 41.4 39.0 - 52.0 %   MCV 86.6 80.0 - 100.0 fL   MCH 28.7 26.0 - 34.0 pg   MCHC 33.1 30.0 - 36.0 g/dL   RDW 11.9 11.5 - 15.5 %   Platelets 222 150 - 400 K/uL   nRBC 0.0 0.0 - 0.2 %   Neutrophils Relative % 82 %   Neutro Abs 9.6 (H) 1.7 -  7.7 K/uL   Lymphocytes Relative 13 %   Lymphs Abs 1.5 0.7 - 4.0 K/uL   Monocytes Relative 5 %   Monocytes Absolute 0.6 0.1 - 1.0 K/uL   Eosinophils Relative 0 %   Eosinophils Absolute 0.1 0.0 - 0.5 K/uL   Basophils Relative 0 %   Basophils Absolute 0.0 0.0 - 0.1 K/uL   Immature Granulocytes 0 %   Abs Immature Granulocytes 0.05 0.00 - 0.07 K/uL    Comment: Performed at [facility], [address], [place],  [address]  Protime-INR     Status: None   Collection Time: 10/26/21  7:54 PM  Result Value Ref Range   Prothrombin Time 13.0 11.4 - 15.2 seconds   INR 1.0 0.8 - 1.2    Comment: (NOTE) INR goal  varies based on device and disease states. Performed at [facility], [place] [address], [place], [place] [address]   Type and screen [place]     Status: None   Collection Time: 10/26/21  8:44 PM  Result Value Ref Range   ABO/RH(D) O POS    Antibody Screen NEG    Sample Expiration      10/29/2021,2359 Performed at [facility], [place] [address] [place], [place] [address]   Basic metabolic panel     Status: Abnormal   Collection Time: 10/26/21 10:00 PM  Result Value Ref Range   Sodium 136 135 - 145 mmol/L   Potassium 4.0 3.5 - 5.1 mmol/L   Chloride 104 98 - 111 mmol/L   CO2 24 22 - 32 mmol/L   Glucose, Bld 225 (H) 70 - 99 mg/dL    Comment: Glucose reference range applies only to samples taken after fasting for at least 8 hours.   BUN 15 8 - 23 mg/dL   Creatinine, Ser 1.21 0.61 - 1.24 mg/dL   Calcium 8.6 (L) 8.9 - 10.3 mg/dL   GFR, Estimated >60 >60 mL/min    Comment: (NOTE) Calculated using the CKD-EPI Creatinine Equation (2021)    Anion gap 8 5 - 15    Comment: Performed at [facility] [address], [place], [place] [address]  CBG monitoring, ED     Status: Abnormal   Collection Time: 10/27/21  2:59 AM  Result Value Ref Range   Glucose-Capillary 241 (H) 70 - 99 mg/dL    Comment: Glucose reference range applies only to  samples taken after fasting for at least 8 hours.  Magnesium     Status: Abnormal   Collection Time: 10/27/21  5:27 AM  Result Value Ref Range   Magnesium 1.4 (L) 1.7 - 2.4 mg/dL    Comment: Performed at [facility] [address], [place], [place] [address]  Comprehensive metabolic panel     Status: Abnormal   Collection Time: 10/27/21  5:27 AM  Result Value Ref Range   Sodium 134 (L) 135 - 145 mmol/L   Potassium 4.2 3.5 - 5.1 mmol/L   Chloride 100 98 - 111 mmol/L   CO2 24 22 - 32 mmol/L   Glucose, Bld 253 (H) 70 - 99 mg/dL    Comment: Glucose reference range applies only to samples taken after fasting for at least 8 hours.   BUN 14 8 - 23 mg/dL   Creatinine, Ser 1.19 0.61 - 1.24 mg/dL   Calcium 8.7 (L) 8.9 - 10.3 mg/dL   Total Protein 6.6 6.5 - 8.1 g/dL   Albumin 3.4 (L) 3.5 - 5.0 g/dL   AST 27 15 - 41 U/L   ALT 29 0 - 44 U/L   Alkaline Phosphatase 62 38 - 126 U/L   Total Bilirubin 0.8 0.3 - 1.2 mg/dL   GFR, Estimated >60 >60 mL/min    Comment: (NOTE) Calculated using the CKD-EPI Creatinine Equation (2021)    Anion gap 10 5 - 15    Comment: Performed at [facility], [place] [address], [place], [place] [address]  CBC with Differential/Platelet     Status: Abnormal   Collection Time: 10/27/21  5:27 AM  Result Value Ref Range   WBC 13.4 (H) 4.0 - 10.5 K/uL   RBC 4.64 4.22 - 5.81 MIL/uL   Hemoglobin 13.0 13.0 - 17.0 g/dL   HCT 40.3 39.0 - 52.0 %  MCV 86.9 80.0 - 100.0 fL   MCH 28.0 26.0 - 34.0 pg   MCHC 32.3 30.0 - 36.0 g/dL   RDW 11.9 11.5 - 15.5 %   Platelets 228 150 - 400 K/uL   nRBC 0.0 0.0 - 0.2 %   Neutrophils Relative % 88 %   Neutro Abs 11.8 (H) 1.7 - 7.7 K/uL   Lymphocytes Relative 7 %   Lymphs Abs 0.9 0.7 - 4.0 K/uL   Monocytes Relative 5 %   Monocytes Absolute 0.7 0.1 - 1.0 K/uL   Eosinophils Relative 0 %   Eosinophils Absolute 0.0 0.0 - 0.5 K/uL   Basophils Relative 0 %   Basophils Absolute 0.0 0.0 - 0.1 K/uL   Immature Granulocytes 0 %    Abs Immature Granulocytes 0.04 0.00 - 0.07 K/uL    Comment: Performed at [facility] [address] [place], [place] [address]  Hemoglobin A1c     Status: Abnormal   Collection Time: 10/27/21  5:27 AM  Result Value Ref Range   Hgb A1c MFr Bld 10.6 (H) 4.8 - 5.6 %    Comment: (NOTE) Pre diabetes:          5.7%-6.4%  Diabetes:              >6.4%  Glycemic control for   <7.0% adults with diabetes    Mean Plasma Glucose 257.52 mg/dL    Comment: Performed at [facility] [address] [place], [place] [address]  VITAMIN D 25 Hydroxy (Vit-D Deficiency, Fractures)     Status: None   Collection Time: 10/27/21  5:27 AM  Result Value Ref Range   Vit D, 25-Hydroxy 46.84 30 - 100 ng/mL    Comment: (NOTE) Vitamin D deficiency has been defined by the Institute of Medicine  and an Endocrine Society practice guideline as a level of serum 25-OH  vitamin D less than 20 ng/mL (1,2). The Endocrine Society went on to  further define vitamin D insufficiency as a level between 21 and 29  ng/mL (2).  1. IOM (Institute of Medicine). 2010. Dietary reference intakes for  calcium and D. [place]: The [employer]. 2. Holick MF, Binkley [place], Bischoff-Ferrari HA, et al. Evaluation,  treatment, and prevention of vitamin D deficiency: an Endocrine  Society clinical practice guideline, JCEM. 2011 Jul; 96(7): 1911-30.  Performed at [facility], [place] [address] [place],  [address]   CBG monitoring, ED     Status: Abnormal   Collection Time: 10/27/21  6:58 AM  Result Value Ref Range   Glucose-Capillary 252 (H) 70 - 99 mg/dL    Comment: Glucose reference range applies only to samples taken after fasting for at least 8 hours.  CBG monitoring, ED     Status: Abnormal   Collection Time: 10/27/21 12:53 PM  Result Value Ref Range   Glucose-Capillary 174 (H) 70 - 99 mg/dL    Comment: Glucose reference range applies only to samples taken after fasting for at least 8  hours.   DG FEMUR MIN 2 VIEWS LEFT  Result Date: 10/26/2021 CLINICAL DATA:  Left hip pain after a fall. History of below the knee amputation. EXAM: LEFT FEMUR 2 VIEWS; PELVIS - 1-2 VIEW COMPARISON:  None Available. FINDINGS: Degenerative changes in the lower lumbar spine and hips. Pelvis appears intact. There is an acute comminuted inter trochanteric and subtrochanteric fracture of the proximal left femur with varus angulation of the fracture fragments. Mildly displaced  lesser trochanteric fragment. No dislocation at the hip joint. Seed implants are seen in the prostate gland. Vascular calcifications. The midshaft and distal femur are unremarkable. Degenerative changes in the knee joint. IMPRESSION: Acute comminuted inter trochanteric and subtrochanteric fracture of the proximal left femur with varus angulation. Electronically Signed   By: Lucienne Capers M.D.   On: 10/26/2021 22:00   DG Pelvis 1-2 Views  Result Date: 10/26/2021 CLINICAL DATA:  Left hip pain after a fall. History of below the knee amputation. EXAM: LEFT FEMUR 2 VIEWS; PELVIS - 1-2 VIEW COMPARISON:  None Available. FINDINGS: Degenerative changes in the lower lumbar spine and hips. Pelvis appears intact. There is an acute comminuted inter trochanteric and subtrochanteric fracture of the proximal left femur with varus angulation of the fracture fragments. Mildly displaced lesser trochanteric fragment. No dislocation at the hip joint. Seed implants are seen in the prostate gland. Vascular calcifications. The midshaft and distal femur are unremarkable. Degenerative changes in the knee joint. IMPRESSION: Acute comminuted inter trochanteric and subtrochanteric fracture of the proximal left femur with varus angulation. Electronically Signed   By: Lucienne Capers M.D.   On: 10/26/2021 22:00    Pending Labs Unresulted Labs (From admission, onward)     Start     Ordered   10/27/21 0403  Urinalysis, Complete w Microscopic  Once,   R         10/27/21 0402   10/26/21 2348  Magnesium  Add-on,   AD        10/26/21 2348   Signed and Held  Surgical pcr screen  Once,   R        Signed and Held            Vitals/Pain Today's Vitals   10/27/21 0612 10/27/21 0614 10/27/21 1038 10/27/21 1258  BP:   (!) 178/79 136/75  Pulse:   90 88  Resp:   16 16  Temp:   98 F (36.7 C) 98.3 F (36.8 C)  TempSrc:   Oral Oral  SpO2:   96% 97%  PainSc: Asleep Asleep 8  Asleep    Isolation Precautions No active isolations  Medications Medications  acetaminophen (TYLENOL) tablet 650 mg (has no administration in time range)    Or  acetaminophen (TYLENOL) suppository 650 mg (has no administration in time range)  naloxone [facility]) injection 0.4 mg (has no administration in time range)  HYDROmorphone (DILAUDID) injection 0.5 mg (0.5 mg Intravenous Given 10/27/21 1040)  ondansetron (ZOFRAN) injection 4 mg (has no administration in time range)  pantoprazole (PROTONIX) injection 40 mg (40 mg Intravenous Given 10/27/21 1039)  insulin aspart (novoLOG) injection 0-15 Units (8 Units Subcutaneous Given 10/27/21 1040)  insulin detemir (LEVEMIR) injection 5 Units (has no administration in time range)  fentaNYL (SUBLIMAZE) injection 100 mcg (100 mcg Intravenous Given 10/26/21 2046)  ondansetron (ZOFRAN) injection 4 mg (4 mg Intravenous Given 10/26/21 2046)  HYDROmorphone (DILAUDID) injection 0.5 mg (0.5 mg Intravenous Given 10/27/21 0047)    Mobility non-ambulatory High fall risk   Focused Assessments     R Recommendations: See Admitting Provider Note  Report given to:   Additional Notes:

## 2021-10-27 NOTE — Anesthesia Preprocedure Evaluation (Addendum)
Anesthesia Evaluation  Patient identified by MRN, date of birth, ID band Patient awake    Reviewed: Allergy & Precautions, NPO status , Patient's Chart, lab work & pertinent test results  Airway Mallampati: II  TM Distance: >3 FB Neck ROM: Full    Dental  (+) Upper Dentures, Lower Dentures, Edentulous Upper, Partial Lower, Poor Dentition, Dental Advisory Given   Pulmonary Patient abstained from smoking., former smoker   Pulmonary exam normal breath sounds clear to auscultation       Cardiovascular hypertension, + Peripheral Vascular Disease  Normal cardiovascular exam Rhythm:Regular Rate:Normal  Echo 2017 - Left ventricle: The cavity size was normal. Wall thickness was increased in a pattern of mild LVH. Systolic function was vigorous. The estimated ejection fraction was in the range of 65% to 70%. Wall motion was normal; there were no regional wall motion abnormalities. Codominant mitral inflow - indeterminate diastolic function.  - Left atrium: The atrium was normal in size.  - Inferior vena cava: The vessel was normal in size. The  respirophasic diameter changes were in the normal range (>= 50%), consistent with normal central venous pressure.   Impressions:  - LVEF 65-70%, mild LVH, normal wall motion, indeterminate diastolic function, normal LA size, normal IVC.    Neuro/Psych  PSYCHIATRIC DISORDERS     Dementia TIA Neuromuscular disease CVA, Residual Symptoms    GI/Hepatic Neg liver ROS,GERD  ,,  Endo/Other  diabetes    Renal/GU Renal disease     Musculoskeletal  (+) Arthritis ,    Abdominal   Peds  Hematology  (+) Blood dyscrasia, anemia   Anesthesia Other Findings   Reproductive/Obstetrics negative OB ROS                             Anesthesia Physical Anesthesia Plan  ASA: 3  Anesthesia Plan: General   Post-op Pain Management: Ofirmev IV (intra-op)*   Induction:  Intravenous  PONV Risk Score and Plan: 1 and Treatment may vary due to age or medical condition, Ondansetron and Dexamethasone  Airway Management Planned: Oral ETT  Additional Equipment:   Intra-op Plan:   Post-operative Plan: Extubation in OR  Informed Consent: I have reviewed the patients History and Physical, chart, labs and discussed the procedure including the risks, benefits and alternatives for the proposed anesthesia with the patient or authorized representative who has indicated his/her understanding and acceptance.     Dental advisory given  Plan Discussed with: CRNA  (Dr. Marlou Sa requests no block. He will infiltrate local in the field. )Anesthesia Plan Comments:        Anesthesia Quick Evaluation

## 2021-10-27 NOTE — Progress Notes (Addendum)
Pt arrived to unit. Alert/oriented in no apparent distress. Pt situated/orientated to room /equipments. Welcome guide/menu provided .Hospital valuables policy has been discussed with pt and family, No complaints.Hospital bed in lowest position with 3 side rails up, call bell/room phone within reach and all wheels locked.

## 2021-10-27 NOTE — ED Notes (Signed)
Estill Bamberg (relative) would like to be contacted with an update on pt. Phone is [phone]

## 2021-10-27 NOTE — Progress Notes (Signed)
[facility] PROGRESS NOTE    Progress Note  Billy Torres  YIR:[id number] DOB: 1955/10/04 DOA: 10/26/2021 PCP: Patient, No Pcp Per     Brief Narrative:   Billy Torres is an 66 y.o. male past medical history of ischemic CVA, diabetes mellitus type 2 with peripheral neuropathy, status post left BKA in April 2021 admitted to [facility] after mechanical fall, hip x-ray showed an acute comminuted intertrochanteric and subtrochanteric fracture of the left femur orthopedic surgery was consulted Dr. Marlou Sa   Assessment/Plan:   Acute Closed left hip fracture: The patient was placed n.p.o. started on IV fluid for resuscitation and narcotics for pain control. Vitamin D levels checked For surgical intervention per Ortho hopefully today. Gupta score is 1.2%. Cardiopulmonary risk for complications.  Mechanical fall: He relates he trips no loss of consciousness, UA is pending. PT OT and OT postsurgical.  Leukocytosis: Likely reactive UA is pending. Leukocytosis improving today continue to hold antibiotics.  HLD (hyperlipidemia) Continue statins.  Diabetes mellitus due to underlying condition with complication, without long-term current use  of insulin: Continue to hold metformin, A1c today is 10.6 He is currently n.p.o. placed on sliding scale insulin. We will add long-acting insulin continue CBGs every 4 hours.  History of CVA with significant left residual hemiparesis: Noted.  BPH: Creatinine at baseline holding home alpha blockers can resume postsurgical.  Stage I sacral decubitus ulcer: RN Pressure Injury Documentation: Pressure Injury 05/20/19 Toe (Comment  which one) Right Deep Tissue Pressure Injury - Purple or maroon localized area of discolored intact skin or blood-filled blister due to damage of underlying soft tissue from pressure and/or shear. (Active)  05/20/19 1715  Location: Toe (Comment  which one)  Location Orientation: Right  Staging: Deep  Tissue Pressure Injury - Purple or maroon localized area of discolored intact skin or blood-filled blister due to damage of underlying soft tissue from pressure and/or shear.  Wound Description (Comments):   Present on Admission: Yes     Pressure Injury 05/20/19 Coccyx Medial Stage 1 -  Intact skin with non-blanchable redness of a localized area usually over a bony prominence. (Active)  05/20/19 1715  Location: Coccyx  Location Orientation: Medial  Staging: Stage 1 -  Intact skin with non-blanchable redness of a localized area usually over a bony prominence.  Wound Description (Comments):   Present on Admission: Yes     Pressure Injury 05/27/19 Heel Right Deep Tissue Pressure Injury - Purple or maroon localized area of discolored intact skin or blood-filled blister due to damage of underlying soft tissue from pressure and/or shear. (Active)  05/27/19 0900  Location: Heel  Location Orientation: Right  Staging: Deep Tissue Pressure Injury - Purple or maroon localized area of discolored intact skin or blood-filled blister due to damage of underlying soft tissue from pressure and/or shear.  Wound Description (Comments):   Present on Admission:     Estimated body mass index is 22.96 kg/m as calculated from the following:   Height as of 12/08/20: '5\' 8"'$  (1.727 m).   Weight as of 12/08/20: 68.5 kg.  DVT prophylaxis: lovenox Family Communication:none Status is: Inpatient Remains inpatient appropriate because: Acute left comminuted fracture    Code Status:     Code Status Orders  (From admission, onward)           Start     Ordered   10/26/21 2348  Full code  Continuous        10/26/21 2347  Code Status History     Date Active Date Inactive Code Status Order ID Comments User Context   05/20/2019 1724 05/28/2019 2059 Full Code [id number]  Juanito Doom, MD Inpatient   05/20/2019 1117 05/20/2019 1724 Full Code [id number]  Ina Homes, MD ED   04/28/2019 1514  04/30/2019 2104 Full Code [id number]  Persons, Bevely Palmer, [place] Inpatient   04/02/2019 0839 04/02/2019 1606 Full Code [id number]  Angelia Mould, MD Inpatient   12/11/2018 2032 12/12/2018 1905 Full Code [id number]  Angelia Mould, MD Inpatient   08/15/2016 1652 08/16/2016 2253 Full Code [id number]  Zada Finders, MD Inpatient   05/17/2015 1634 05/31/2015 2038 Full Code [id number]  Flora Lipps Inpatient   05/17/2015 1634 05/17/2015 1634 Full Code [id number]  Bary Leriche, PA-C Inpatient   05/14/2015 1838 05/17/2015 1634 Full Code [id number]  Aquilla Hacker, MD Inpatient   06/27/2013 2128 06/29/2013 2059 Full Code [id number]  Elmarie Shiley, MD Inpatient         IV Access:   Peripheral IV   Procedures and diagnostic studies:   DG FEMUR MIN 2 VIEWS LEFT  Result Date: 10/26/2021 CLINICAL DATA:  Left hip pain after a fall. History of below the knee amputation. EXAM: LEFT FEMUR 2 VIEWS; PELVIS - 1-2 VIEW COMPARISON:  None Available. FINDINGS: Degenerative changes in the lower lumbar spine and hips. Pelvis appears intact. There is an acute comminuted inter trochanteric and subtrochanteric fracture of the proximal left femur with varus angulation of the fracture fragments. Mildly displaced lesser trochanteric fragment. No dislocation at the hip joint. Seed implants are seen in the prostate gland. Vascular calcifications. The midshaft and distal femur are unremarkable. Degenerative changes in the knee joint. IMPRESSION: Acute comminuted inter trochanteric and subtrochanteric fracture of the proximal left femur with varus angulation. Electronically Signed   By: Lucienne Capers M.D.   On: 10/26/2021 22:00   DG Pelvis 1-2 Views  Result Date: 10/26/2021 CLINICAL DATA:  Left hip pain after a fall. History of below the knee amputation. EXAM: LEFT FEMUR 2 VIEWS; PELVIS - 1-2 VIEW COMPARISON:  None Available. FINDINGS: Degenerative changes in the lower lumbar spine and hips. Pelvis appears intact.  There is an acute comminuted inter trochanteric and subtrochanteric fracture of the proximal left femur with varus angulation of the fracture fragments. Mildly displaced lesser trochanteric fragment. No dislocation at the hip joint. Seed implants are seen in the prostate gland. Vascular calcifications. The midshaft and distal femur are unremarkable. Degenerative changes in the knee joint. IMPRESSION: Acute comminuted inter trochanteric and subtrochanteric fracture of the proximal left femur with varus angulation. Electronically Signed   By: Lucienne Capers M.D.   On: 10/26/2021 22:00     Medical Consultants:   None.   Subjective:    Billy Torres he relates his pain is controlled last bowel movement was a day before yesterday.  Objective:    Vitals:   10/27/21 0045 10/27/21 0100 10/27/21 0306 10/27/21 0612  BP: 133/71 125/77 (!) 148/87 (!) 149/76  Pulse: (!) 101 98 93 89  Resp:   18 16  Temp:   98.6 F (37 C) 98.5 F (36.9 C)  TempSrc:      SpO2: 97% 95% 97% 97%   SpO2: 97 %  No intake or output data in the 24 hours ending 10/27/21 0703 There were no vitals filed for this visit.  Exam: General exam: In no acute distress. Respiratory system: Good air movement and clear to auscultation.  Cardiovascular system: S1 & S2 heard, RRR. No JVD. Gastrointestinal system: Abdomen is nondistended, soft and nontender.  Extremities: Left BKA Skin: Sacral decubitus ulcer stage I Psychiatry: Judgement and insight appear normal. Mood & affect appropriate.    Data Reviewed:    Labs: Basic Metabolic Panel: Recent Labs  Lab 10/26/21 2200 10/27/21 0527  NA 136 134*  K 4.0 4.2  CL 104 100  CO2 24 24  GLUCOSE 225* 253*  BUN 15 14  CREATININE 1.21 1.19  CALCIUM 8.6* 8.7*  MG  --  1.4*   GFR CrCl cannot be calculated (Unknown ideal weight.). Liver Function Tests: Recent Labs  Lab 10/27/21 0527  AST 27  ALT 29  ALKPHOS 62  BILITOT 0.8  PROT 6.6  ALBUMIN 3.4*   No  results for input(s): "LIPASE", "AMYLASE" in the last 168 hours. No results for input(s): "AMMONIA" in the last 168 hours. Coagulation profile Recent Labs  Lab 10/26/21 1954  INR 1.0   COVID-19 Labs  No results for input(s): "DDIMER", "FERRITIN", "LDH", "CRP" in the last 72 hours.  Lab Results  Component Value Date   SARSCOV2NAA POSITIVE (A) 12/29/2020   SARSCOV2NAA NEGATIVE 12/16/2019   [place] NEGATIVE 05/28/2019   [place] NEGATIVE 05/20/2019    CBC: Recent Labs  Lab 10/26/21 1954 10/27/21 0527  WBC 11.8* 13.4*  NEUTROABS 9.6* 11.8*  HGB 13.7 13.0  HCT 41.4 40.3  MCV 86.6 86.9  PLT 222 228   Cardiac Enzymes: No results for input(s): "CKTOTAL", "CKMB", "CKMBINDEX", "TROPONINI" in the last 168 hours. BNP (last 3 results) No results for input(s): "PROBNP" in the last 8760 hours. CBG: Recent Labs  Lab 10/27/21 0259 10/27/21 0658  GLUCAP 241* 252*   D-Dimer: No results for input(s): "DDIMER" in the last 72 hours. Hgb A1c: Recent Labs    10/27/21 0527  HGBA1C 10.6*   Lipid Profile: No results for input(s): "CHOL", "HDL", "LDLCALC", "TRIG", "CHOLHDL", "LDLDIRECT" in the last 72 hours. Thyroid function studies: No results for input(s): "TSH", "T4TOTAL", "T3FREE", "THYROIDAB" in the last 72 hours.  Invalid input(s): "FREET3" Anemia work up: No results for input(s): "VITAMINB12", "FOLATE", "FERRITIN", "TIBC", "IRON", "RETICCTPCT" in the last 72 hours. Sepsis Labs: Recent Labs  Lab 10/26/21 1954 10/27/21 0527  WBC 11.8* 13.4*   Microbiology No results found for this or any previous visit (from the past 240 hour(s)).   Medications:    insulin aspart  0-6 Units Subcutaneous Q6H   pantoprazole (PROTONIX) IV  40 mg Intravenous Q24H   Continuous Infusions:    LOS: 1 day   Charlynne Cousins  [facility]  10/27/2021, 7:03 AM

## 2021-10-28 DIAGNOSIS — S72002A Fracture of unspecified part of neck of left femur, initial encounter for closed fracture: Secondary | ICD-10-CM | POA: Diagnosis not present

## 2021-10-28 DIAGNOSIS — E088 Diabetes mellitus due to underlying condition with unspecified complications: Secondary | ICD-10-CM | POA: Diagnosis not present

## 2021-10-28 DIAGNOSIS — D72829 Elevated white blood cell count, unspecified: Secondary | ICD-10-CM | POA: Diagnosis not present

## 2021-10-28 LAB — GLUCOSE, CAPILLARY
Glucose-Capillary: 108 mg/dL — ABNORMAL HIGH (ref 70–99)
Glucose-Capillary: 135 mg/dL — ABNORMAL HIGH (ref 70–99)
Glucose-Capillary: 146 mg/dL — ABNORMAL HIGH (ref 70–99)
Glucose-Capillary: 167 mg/dL — ABNORMAL HIGH (ref 70–99)
Glucose-Capillary: 182 mg/dL — ABNORMAL HIGH (ref 70–99)
Glucose-Capillary: 213 mg/dL — ABNORMAL HIGH (ref 70–99)
Glucose-Capillary: 213 mg/dL — ABNORMAL HIGH (ref 70–99)

## 2021-10-28 MED ORDER — INSULIN ASPART 100 UNIT/ML IJ SOLN
0.0000 [IU] | Freq: Every day | INTRAMUSCULAR | Status: DC
  Administered 2021-10-29: 3 [IU] via SUBCUTANEOUS

## 2021-10-28 MED ORDER — INSULIN ASPART 100 UNIT/ML IJ SOLN
3.0000 [IU] | Freq: Three times a day (TID) | INTRAMUSCULAR | Status: DC
  Administered 2021-10-28 – 2021-10-29 (×3): 3 [IU] via SUBCUTANEOUS

## 2021-10-28 MED ORDER — INSULIN ASPART 100 UNIT/ML IJ SOLN
0.0000 [IU] | Freq: Three times a day (TID) | INTRAMUSCULAR | Status: DC
  Administered 2021-10-28: 5 [IU] via SUBCUTANEOUS
  Administered 2021-10-29: 2 [IU] via SUBCUTANEOUS
  Administered 2021-10-29: 3 [IU] via SUBCUTANEOUS
  Administered 2021-10-29: 8 [IU] via SUBCUTANEOUS
  Administered 2021-10-30: 5 [IU] via SUBCUTANEOUS
  Administered 2021-10-30: 2 [IU] via SUBCUTANEOUS

## 2021-10-28 MED ORDER — PANTOPRAZOLE SODIUM 40 MG PO TBEC
40.0000 mg | DELAYED_RELEASE_TABLET | Freq: Every day | ORAL | Status: DC
  Administered 2021-10-29 – 2021-10-30 (×2): 40 mg via ORAL
  Filled 2021-10-28 (×2): qty 1

## 2021-10-28 NOTE — Progress Notes (Signed)
[facility] PROGRESS NOTE    Progress Note  Billy Torres  DGL:[id number] DOB: 09-Jun-1955 DOA: 10/26/2021 PCP: Patient, No Pcp Per     Brief Narrative:   Billy Torres is an 66 y.o. male past medical history of ischemic CVA, diabetes mellitus type 2 with peripheral neuropathy, status post left BKA in April 2021 admitted to [facility] after mechanical fall, hip x-ray showed an acute comminuted intertrochanteric and subtrochanteric fracture of the left femur orthopedic surgery was consulted Dr. Marlou Sa   Assessment/Plan:   Acute Closed left hip fracture: Vitamin D was 46. Orthopedic surgery was consulted he is status post left intratrochanteric medullary nailing on 10/27/2021 Physical therapy evaluated the patient recommended inpatient rehab.  Mechanical fall: He relates he trips no loss of consciousness, UA is pending. We will need him to rehab.  Leukocytosis: Leukocytosis improving today continue to hold antibiotics.  HLD (hyperlipidemia) Continue statins.  Diabetes mellitus due to underlying condition with complication, without long-term current use  of insulin: Continue to hold metformin, A1c today is 10.6 He is currently n.p.o. placed on sliding scale insulin. We will add long-acting insulin continue CBGs every 4 hours.  History of CVA with significant left residual hemiparesis: Noted.  BPH: Creatinine at baseline holding home alpha blockers can resume postsurgical.  Stage I sacral decubitus ulcer: RN Pressure Injury Documentation: Pressure Injury 05/20/19 Toe (Comment  which one) Right Deep Tissue Pressure Injury - Purple or maroon localized area of discolored intact skin or blood-filled blister due to damage of underlying soft tissue from pressure and/or shear. (Active)  05/20/19 1715  Location: Toe (Comment  which one)  Location Orientation: Right  Staging: Deep Tissue Pressure Injury - Purple or maroon localized area of discolored intact  skin or blood-filled blister due to damage of underlying soft tissue from pressure and/or shear.  Wound Description (Comments):   Present on Admission: Yes     Pressure Injury 05/20/19 Coccyx Medial Stage 1 -  Intact skin with non-blanchable redness of a localized area usually over a bony prominence. (Active)  05/20/19 1715  Location: Coccyx  Location Orientation: Medial  Staging: Stage 1 -  Intact skin with non-blanchable redness of a localized area usually over a bony prominence.  Wound Description (Comments):   Present on Admission: Yes     Pressure Injury 05/27/19 Heel Right Deep Tissue Pressure Injury - Purple or maroon localized area of discolored intact skin or blood-filled blister due to damage of underlying soft tissue from pressure and/or shear. (Active)  05/27/19 0900  Location: Heel  Location Orientation: Right  Staging: Deep Tissue Pressure Injury - Purple or maroon localized area of discolored intact skin or blood-filled blister due to damage of underlying soft tissue from pressure and/or shear.  Wound Description (Comments):   Present on Admission:     Estimated body mass index is 23.57 kg/m as calculated from the following:   Height as of this encounter: '5\' 8"'$  (1.727 m).   Weight as of this encounter: 70.3 kg.  DVT prophylaxis: lovenox Family Communication:none Status is: Inpatient Remains inpatient appropriate because: Acute left comminuted fracture    Code Status:     Code Status Orders  (From admission, onward)           Start     Ordered   10/26/21 2348  Full code  Continuous        10/26/21 2347           Code Status History  Date Active Date Inactive Code Status Order ID Comments User Context   05/20/2019 1724 05/28/2019 2059 Full Code [id number]  Juanito Doom, MD Inpatient   05/20/2019 1117 05/20/2019 1724 Full Code [id number]  Ina Homes, MD ED   04/28/2019 1514 04/30/2019 2104 Full Code [id number]  Persons, Bevely Palmer, [place] Inpatient    04/02/2019 0839 04/02/2019 1606 Full Code [id number]  Angelia Mould, MD Inpatient   12/11/2018 2032 12/12/2018 1905 Full Code [id number]  Angelia Mould, MD Inpatient   08/15/2016 1652 08/16/2016 2253 Full Code [id number]  Zada Finders, MD Inpatient   05/17/2015 1634 05/31/2015 2038 Full Code [id number]  Flora Lipps Inpatient   05/17/2015 1634 05/17/2015 1634 Full Code [id number]  Bary Leriche, PA-C Inpatient   05/14/2015 1838 05/17/2015 1634 Full Code [id number]  Aquilla Hacker, MD Inpatient   06/27/2013 2128 06/29/2013 2059 Full Code [id number]  Elmarie Shiley, MD Inpatient         IV Access:   Peripheral IV   Procedures and diagnostic studies:   DG FEMUR PORT MIN 2 VIEWS LEFT  Result Date: 10/27/2021 CLINICAL DATA:  Status post left femoral ORIF. EXAM: LEFT FEMUR PORTABLE 2 VIEWS COMPARISON:  Pelvic radiograph dated 10/26/2021. FINDINGS: Status post ORIF of the left femur with intramedullary nail and transcervical screw and cerclage wire. The hardware is intact. The bones are osteopenic. There is no dislocation. Prostate brachytherapy seeds noted. Vascular calcifications. IMPRESSION: Status post ORIF of the left femur. Electronically Signed   By: Anner Crete M.D.   On: 10/27/2021 18:49   DG FEMUR MIN 2 VIEWS LEFT  Result Date: 10/27/2021 CLINICAL DATA:  IM nail femur. EXAM: LEFT FEMUR 2 VIEWS; DG C-ARM 1-60 MIN-NO REPORT COMPARISON:  Plain film of the LEFT femur dated 10/26/2021. FINDINGS: Six intraoperative fluoroscopic images are provided. Images demonstrate placement of intramedullary rod with dynamic hip screws traversing the LEFT femoral neck fracture site. Hardware appears intact and appropriately positioned. Fluoroscopy provided for 119 seconds. Dose: 21.68 mGy IMPRESSION: Intraoperative fluoroscopic images, as detailed above. No evidence of surgical complicating feature. Electronically Signed   By: Franki Cabot M.D.   On: 10/27/2021 17:36   DG C-Arm 1-60  Min-No Report  Result Date: 10/27/2021 Fluoroscopy was utilized by the requesting physician.  No radiographic interpretation.   DG C-Arm 1-60 Min-No Report  Result Date: 10/27/2021 Fluoroscopy was utilized by the requesting physician.  No radiographic interpretation.   DG FEMUR MIN 2 VIEWS LEFT  Result Date: 10/26/2021 CLINICAL DATA:  Left hip pain after a fall. History of below the knee amputation. EXAM: LEFT FEMUR 2 VIEWS; PELVIS - 1-2 VIEW COMPARISON:  None Available. FINDINGS: Degenerative changes in the lower lumbar spine and hips. Pelvis appears intact. There is an acute comminuted inter trochanteric and subtrochanteric fracture of the proximal left femur with varus angulation of the fracture fragments. Mildly displaced lesser trochanteric fragment. No dislocation at the hip joint. Seed implants are seen in the prostate gland. Vascular calcifications. The midshaft and distal femur are unremarkable. Degenerative changes in the knee joint. IMPRESSION: Acute comminuted inter trochanteric and subtrochanteric fracture of the proximal left femur with varus angulation. Electronically Signed   By: Lucienne Capers M.D.   On: 10/26/2021 22:00   DG Pelvis 1-2 Views  Result Date: 10/26/2021 CLINICAL DATA:  Left hip pain after a fall. History of below the knee amputation. EXAM: LEFT FEMUR 2 VIEWS; PELVIS - 1-2 VIEW COMPARISON:  None Available. FINDINGS: Degenerative changes in the  lower lumbar spine and hips. Pelvis appears intact. There is an acute comminuted inter trochanteric and subtrochanteric fracture of the proximal left femur with varus angulation of the fracture fragments. Mildly displaced lesser trochanteric fragment. No dislocation at the hip joint. Seed implants are seen in the prostate gland. Vascular calcifications. The midshaft and distal femur are unremarkable. Degenerative changes in the knee joint. IMPRESSION: Acute comminuted inter trochanteric and subtrochanteric fracture of the  proximal left femur with varus angulation. Electronically Signed   By: Lucienne Capers M.D.   On: 10/26/2021 22:00     Medical Consultants:   None.   Subjective:    Billy Torres relates his pain is controlled last bowel movement was yesterday.  Objective:    Vitals:   10/27/21 1951 10/27/21 2357 10/28/21 0412 10/28/21 0826  BP: 138/74 122/81 128/72 126/70  Pulse: 86 87 83 78  Resp: '17 17  18  '$ Temp: 97.6 F (36.4 C) 97.7 F (36.5 C)  98 F (36.7 C)  TempSrc: Oral     SpO2: 99% 97% 100% 99%  Weight:      Height:       SpO2: 99 %   Intake/Output Summary (Last 24 hours) at 10/28/2021 1006 Last data filed at 10/27/2021 1950 Gross per 24 hour  Intake 1050 ml  Output 300 ml  Net 750 ml   Filed Weights   10/27/21 1438  Weight: 70.3 kg    Exam: General exam: In no acute distress. Respiratory system: Good air movement and clear to auscultation. Cardiovascular system: S1 & S2 heard, RRR. No JVD. Gastrointestinal system: Abdomen is nondistended, soft and nontender.  Extremities: No pedal edema. Skin: No rashes, lesions or ulcers Psychiatry: Judgement and insight appear normal. Mood & affect appropriate.  Data Reviewed:    Labs: Basic Metabolic Panel: Recent Labs  Lab 10/26/21 2200 10/27/21 0527  NA 136 134*  K 4.0 4.2  CL 104 100  CO2 24 24  GLUCOSE 225* 253*  BUN 15 14  CREATININE 1.21 1.19  CALCIUM 8.6* 8.7*  MG  --  1.4*    GFR Estimated Creatinine Clearance: 58.3 mL/min (by C-G formula based on SCr of 1.19 mg/dL). Liver Function Tests: Recent Labs  Lab 10/27/21 0527  AST 27  ALT 29  ALKPHOS 62  BILITOT 0.8  PROT 6.6  ALBUMIN 3.4*    No results for input(s): "LIPASE", "AMYLASE" in the last 168 hours. No results for input(s): "AMMONIA" in the last 168 hours. Coagulation profile Recent Labs  Lab 10/26/21 1954  INR 1.0    COVID-19 Labs  No results for input(s): "DDIMER", "FERRITIN", "LDH", "CRP" in the last 72 hours.  Lab  Results  Component Value Date   SARSCOV2NAA POSITIVE (A) 12/29/2020   SARSCOV2NAA NEGATIVE 12/16/2019   [place] NEGATIVE 05/28/2019   [place] NEGATIVE 05/20/2019    CBC: Recent Labs  Lab 10/26/21 1954 10/27/21 0527  WBC 11.8* 13.4*  NEUTROABS 9.6* 11.8*  HGB 13.7 13.0  HCT 41.4 40.3  MCV 86.6 86.9  PLT 222 228    Cardiac Enzymes: No results for input(s): "CKTOTAL", "CKMB", "CKMBINDEX", "TROPONINI" in the last 168 hours. BNP (last 3 results) No results for input(s): "PROBNP" in the last 8760 hours. CBG: Recent Labs  Lab 10/27/21 1741 10/27/21 1951 10/28/21 0021 10/28/21 0411 10/28/21 0844  GLUCAP 144* 185* 182* 167* 135*    D-Dimer: No results for input(s): "DDIMER" in the last 72 hours. Hgb A1c: Recent Labs    10/27/21 0527  HGBA1C 10.6*    Lipid Profile: No results for input(s): "CHOL", "HDL", "LDLCALC", "TRIG", "CHOLHDL", "LDLDIRECT" in the last 72 hours. Thyroid function studies: No results for input(s): "TSH", "T4TOTAL", "T3FREE", "THYROIDAB" in the last 72 hours.  Invalid input(s): "FREET3" Anemia work up: No results for input(s): "VITAMINB12", "FOLATE", "FERRITIN", "TIBC", "IRON", "RETICCTPCT" in the last 72 hours. Sepsis Labs: Recent Labs  Lab 10/26/21 1954 10/27/21 0527  WBC 11.8* 13.4*    Microbiology No results found for this or any previous visit (from the past 240 hour(s)).   Medications:    acetaminophen  1,000 mg Oral Q6H   aspirin  81 mg Oral BID   docusate sodium  100 mg Oral BID   insulin aspart  0-15 Units Subcutaneous Q4H   insulin detemir  5 Units Subcutaneous BID   pantoprazole (PROTONIX) IV  40 mg Intravenous Q24H   Continuous Infusions:  methocarbamol (ROBAXIN) IV        LOS: 2 days   Charlynne Cousins  [facility]  10/28/2021, 10:06 AM

## 2021-10-28 NOTE — Evaluation (Signed)
Occupational Therapy Evaluation Patient Details Name: Billy Torres MRN: [id number] DOB: [birth year] Today's Date: 10/28/2021   History of Present Illness Pt is a 66 y.o. M who presents following a fall with left hip intertrochanteric/subtrochanteric femur fx now s/p ORIF 10/27/2021. Significant PMH: CKD, stroke, L BKA, PAD, prostate CA, DM2, sensorineural hearing loss.   Clinical Impression   This 66 yo male admitted and underwent above presents to acute OT with PLOF of being Mod I with all basic ADLs at [facility] level. Currently he is setup/S-total A for basic ADLs from bed level and sit<>stand at bed level. He will continue to benefit from acute OT with follow up on AIR recommended.      Recommendations for follow up therapy are one component of a multi-disciplinary discharge planning process, led by the attending physician.  Recommendations may be updated based on patient status, additional functional criteria and insurance authorization.   Follow Up Recommendations  Acute inpatient rehab (3hours/day)    Assistance Recommended at Discharge Frequent or constant Supervision/Assistance  Patient can return home with the following A lot of help with walking and/or transfers;A lot of help with bathing/dressing/bathroom;Assistance with cooking/housework;Help with stairs or ramp for entrance;Assist for transportation    Functional Status Assessment  Patient has had a recent decline in their functional status and demonstrates the ability to make significant improvements in function in a reasonable and predictable amount of time.  Equipment Recommendations  Other (comment) (TBD next venue)    Recommendations for Other Services Rehab consult     Precautions / Restrictions Precautions Precautions: Fall Restrictions Weight Bearing Restrictions: Yes LLE Weight Bearing: Partial weight bearing LLE Partial Weight Bearing Percentage or Pounds: 50 pounds      Mobility Bed Mobility Overal bed  mobility: Needs Assistance Bed Mobility: Supine to Sit     Supine to sit: Max assist, HOB elevated     General bed mobility comments: use of rail, all movements take increased time    Transfers Overall transfer level: Needs assistance Equipment used: Ambulation equipment used Transfers: Sit to/from Stand Sit to Stand: Min assist           General transfer comment: Clarise Cruz stedy      Balance Overall balance assessment: Needs assistance Sitting-balance support: Bilateral upper extremity supported, Feet supported Sitting balance-Leahy Scale: Poor     Standing balance support: Bilateral upper extremity supported Standing balance-Leahy Scale: Poor                             ADL either performed or assessed with clinical judgement   ADL Overall ADL's : Needs assistance/impaired Eating/Feeding: Set up;Bed level   Grooming: Min guard;Sitting Grooming Details (indicate cue type and reason): EOB Upper Body Bathing: Minimal assistance;Sitting Upper Body Bathing Details (indicate cue type and reason): EOB Lower Body Bathing: Maximal assistance Lower Body Bathing Details (indicate cue type and reason): min A sit<>stand with sara stedy Upper Body Dressing : Minimal assistance;Sitting Upper Body Dressing Details (indicate cue type and reason): EOB Lower Body Dressing: Total assistance Lower Body Dressing Details (indicate cue type and reason): min A sit<>stand with sara stedy Toilet Transfer: Minimal assistance;+2 for safety/equipment Toilet Transfer Details (indicate cue type and reason): sara stedy Toileting- Clothing Manipulation and Hygiene: Total assistance Toileting - Clothing Manipulation Details (indicate cue type and reason): min A sit<>stand with sara stedy       General ADL Comments: total A to donn/doff prothestic at EOB  Vision Patient Visual Report: No change from baseline              Pertinent Vitals/Pain Pain Assessment Pain Assessment:  0-10 Pain Score: 8  Pain Location: LLE with movement Pain Descriptors / Indicators: Grimacing, Operative site guarding, Discomfort Pain Intervention(s): Limited activity within patient's tolerance, Monitored during session, Repositioned, Patient requesting pain meds-RN notified, RN gave pain meds during session     Hand Dominance Right   Extremity/Trunk Assessment Upper Extremity Assessment Upper Extremity Assessment: Generalized weakness           Communication Communication Communication: HOH   Cognition Arousal/Alertness: Awake/alert Behavior During Therapy: Flat affect Overall Cognitive Status: Impaired/Different from baseline Area of Impairment: Following commands, Problem solving                       Following Commands: Follows one step commands with increased time     Problem Solving: Slow processing, Requires verbal cues General Comments: Pt with hx of cognitive impairment following stroke and is also HOH, thus impacting assessment. Delayed processing                Home Living Family/patient expects to be discharged to:: Inpatient rehab Living Arrangements: Children Available Help at Discharge: Family;Available PRN/intermittently Type of Home: Mobile home Home Access: Stairs to enter Entrance Stairs-Number of Steps: 1 Entrance Stairs-Rails: None Home Layout: One level     Bathroom Shower/Tub: Occupational psychologist: Standard Bathroom Accessibility: Yes How Accessible: Accessible via walker Home Equipment: Cane - single point;Wheelchair - manual   Additional Comments: Pt reports just moved in 1 wk ago, states, "they are supposed to be bringing me a shower chair and toilet riser."  Lives With: Son    Prior Functioning/Environment Prior Level of Function : Needs assist             Mobility Comments: using cane, reports 2 falls in past week ADLs Comments: independent ADL's        OT Problem List: Decreased range of  motion;Impaired balance (sitting and/or standing);Decreased activity tolerance;Pain;Decreased cognition;Decreased knowledge of precautions      OT Treatment/Interventions: Self-care/ADL training;DME and/or AE instruction;Patient/family education;Balance training    OT Goals(Current goals can be found in the care plan section) Acute Rehab OT Goals Patient Stated Goal: for pain to be better OT Goal Formulation: With patient Time For Goal Achievement: 11/11/21 Potential to Achieve Goals: Good  OT Frequency: Min 2X/week       AM-PAC OT "6 Clicks" Daily Activity     Outcome Measure Help from another person eating meals?: A Little Help from another person taking care of personal grooming?: A Little Help from another person toileting, which includes using toliet, bedpan, or urinal?: A Lot Help from another person bathing (including washing, rinsing, drying)?: A Lot Help from another person to put on and taking off regular upper body clothing?: A Little Help from another person to put on and taking off regular lower body clothing?: Total 6 Click Score: 14   End of Session Equipment Utilized During Treatment: Gait belt (sara stedy) Nurse Communication: Need for lift equipment  Activity Tolerance: Patient limited by pain Patient left: in bed;with call bell/phone within reach;with bed alarm set  OT Visit Diagnosis: Unsteadiness on feet (R26.81);Other abnormalities of gait and mobility (R26.89);Muscle weakness (generalized) (M62.81);Pain Pain - Right/Left: Left Pain - part of body: Leg  Time: 1594-7076 OT Time Calculation (min): 36 min Charges:  OT General Charges $OT Visit: 1 Visit OT Evaluation $OT Eval Moderate Complexity: 1 Mod OT Treatments $Self Care/Home Management : 8-22 mins  Golden Circle, OTR/L Acute Rehab Services [facility] [place] [phone] Office [phone]    Almon Register 10/28/2021, 5:37 PM

## 2021-10-28 NOTE — Evaluation (Signed)
Physical Therapy Evaluation Patient Details Name: Billy Torres MRN: [id number] DOB: Oct 05, 1955 Today's Date: 10/28/2021  History of Present Illness  Pt is a 66 y.o. M who presents following a fall with left hip intertrochanteric/subtrochanteric femur fx now s/p ORIF 10/27/2021. Significant PMH: CKD, stroke, L BKA, PAD, prostate CA, DM2, sensorineural hearing loss.  Clinical Impression  PTA, pt lives with his son, is ambulatory with a cane and prosthetic, and independent with ADL's. Pt presents with decreased functional mobility secondary to L residual limb weakness, pain, and impaired sitting balance. Pt requiring moderate assist for bed mobility and utilized lift equipment [facility]), standing on RLE to progress from bed to chair. Did not don LLE prosthetic this session due to high pain levels and tenderness to touch/palpation. Will benefit from post acute rehabilitation to address deficits and maximize functional mobility.      Recommendations for follow up therapy are one component of a multi-disciplinary discharge planning process, led by the attending physician.  Recommendations may be updated based on patient status, additional functional criteria and insurance authorization.  Follow Up Recommendations Acute inpatient rehab (3hours/day)      Assistance Recommended at Discharge Frequent or constant Supervision/Assistance  Patient can return home with the following  A lot of help with walking and/or transfers;A lot of help with bathing/dressing/bathroom;Assistance with cooking/housework;Assist for transportation;Help with stairs or ramp for entrance    Equipment Recommendations Rolling walker (2 wheels);BSC/3in1  Recommendations for Other Services  Rehab consult;OT consult    Functional Status Assessment Patient has had a recent decline in their functional status and demonstrates the ability to make significant improvements in function in a reasonable and predictable amount of time.      Precautions / Restrictions Precautions Precautions: Fall Restrictions Weight Bearing Restrictions: Yes LLE Weight Bearing: Partial weight bearing LLE Partial Weight Bearing Percentage or Pounds: 50      Mobility  Bed Mobility Overal bed mobility: Needs Assistance Bed Mobility: Supine to Sit     Supine to sit: Mod assist     General bed mobility comments: Slow to initiate, requiring tactile cues for moving RLE out to side, use of bed rail. Utilized bed pad to scoot left hip out to edge of bed. Truncal assist to upright    Transfers Overall transfer level: Needs assistance Equipment used: Ambulation equipment used Transfers: Sit to/from Stand Sit to Stand: Min assist           General transfer comment: Michaelyn Barter to progress from bed to chair    Ambulation/Gait                  Stairs            Wheelchair Mobility    Modified Rankin (Stroke Patients Only)       Balance Overall balance assessment: Needs assistance Sitting-balance support: Feet supported Sitting balance-Leahy Scale: Poor Sitting balance - Comments: right lateral lean, guarding away from painful side. x 2 episodes of lateral LOB                                     Pertinent Vitals/Pain Pain Assessment Pain Assessment: Faces Faces Pain Scale: Hurts whole lot Pain Location: LLE Pain Descriptors / Indicators: Grimacing, Operative site guarding, Discomfort Pain Intervention(s): Limited activity within patient's tolerance, Monitored during session, Patient requesting pain meds-RN notified    Home Living Family/patient expects to be discharged to:: Private residence  Living Arrangements: Children (son) Available Help at Discharge: Family Type of Home: Mobile home Home Access: Stairs to enter Entrance Stairs-Rails: None Entrance Stairs-Number of Steps: 3   Home Layout: One level Home Equipment: Cane - single point;Wheelchair - manual Additional  Comments: Pt reports just moved in 1 wk ago, states, "they are supposed to be bringing me a shower chair and toilet riser."    Prior Function Prior Level of Function : Needs assist             Mobility Comments: using cane, reports 2 falls in oast week ADLs Comments: independent ADL's     Hand Dominance        Extremity/Trunk Assessment   Upper Extremity Assessment Upper Extremity Assessment: Defer to OT evaluation    Lower Extremity Assessment Lower Extremity Assessment: LLE deficits/detail LLE Deficits / Details: Femur fx s/p ORIF, prior BKA, hx residual weakness from stroke. Pt able to reach neutral knee extension    Cervical / Trunk Assessment Cervical / Trunk Assessment: Normal  Communication   Communication: HOH  Cognition Arousal/Alertness: Awake/alert Behavior During Therapy: Flat affect Overall Cognitive Status: Impaired/Different from baseline Area of Impairment: Following commands, Problem solving                       Following Commands: Follows one step commands with increased time     Problem Solving: Slow processing, Requires verbal cues General Comments: Pt with hx of cognitive impairment following stroke and is also HOH, thus impacting assessment. Delayed processing        General Comments      Exercises     Assessment/Plan    PT Assessment Patient needs continued PT services  PT Problem List Decreased strength;Decreased activity tolerance;Decreased balance;Decreased mobility;Decreased cognition;Decreased safety awareness;Decreased knowledge of precautions;Pain       PT Treatment Interventions DME instruction;Gait training;Stair training;Functional mobility training;Therapeutic activities;Therapeutic exercise;Balance training;Patient/family education    PT Goals (Current goals can be found in the Care Plan section)  Acute Rehab PT Goals Patient Stated Goal: did not state PT Goal Formulation: With patient Time For Goal  Achievement: 11/11/21 Potential to Achieve Goals: Good    Frequency Min 5X/week     Co-evaluation               AM-PAC PT "6 Clicks" Mobility  Outcome Measure Help needed turning from your back to your side while in a flat bed without using bedrails?: A Little Help needed moving from lying on your back to sitting on the side of a flat bed without using bedrails?: A Lot Help needed moving to and from a bed to a chair (including a wheelchair)?: A Lot Help needed standing up from a chair using your arms (e.g., wheelchair or bedside chair)?: A Lot Help needed to walk in hospital room?: Total Help needed climbing 3-5 steps with a railing? : Total 6 Click Score: 11    End of Session   Activity Tolerance: Patient limited by pain Patient left: in chair;with call bell/phone within reach Nurse Communication: Mobility status PT Visit Diagnosis: Pain;Difficulty in walking, not elsewhere classified (R26.2) Pain - Right/Left: Left Pain - part of body: Leg    Time: 5053-9767 PT Time Calculation (min) (ACUTE ONLY): 31 min   Charges:   PT Evaluation $PT Eval Moderate Complexity: 1 Mod PT Treatments $Therapeutic Activity: 8-22 mins        Wyona Almas, PT, DPT Acute Rehabilitation Services Office [place]  10/28/2021, 9:41 AM

## 2021-10-28 NOTE — Progress Notes (Signed)
Inpatient Rehab Admissions Coordinator Note:   Per PT patient was screened for CIR candidacy by Chadrick Sprinkle Danford Bad, CCC-SLP. At this time, pt appears to be a potential candidate for CIR. I will place an order for rehab consult for full assessment, per our protocol. Will also discuss medical necessity with physiatrist. Please contact me any with questions.Gayland Curry, [place], [place] Admissions Coordinator [phone] 10/28/21 10:50 AM

## 2021-10-28 NOTE — Plan of Care (Signed)
  Problem: Education: Goal: Ability to describe self-care measures that may prevent or decrease complications (Diabetes Survival Skills Education) will improve Outcome: Progressing Goal: Individualized Educational Video(s) Outcome: Progressing   Problem: Coping: Goal: Ability to adjust to condition or change in health will improve Outcome: Progressing   

## 2021-10-28 NOTE — Plan of Care (Signed)

## 2021-10-28 NOTE — Progress Notes (Addendum)
Inpatient Rehab Admissions:  Inpatient Rehab Consult received.  I met with patient at the bedside for rehabilitation assessment and to discuss goals and expectations of an inpatient rehab admission.  Discussed average length of stay, insurance authorization requirement of his insurance, discharge home after completion of CIR. Pt acknowledged understanding. Pt interested in pursuing CIR. Pt gave permission to contact son Larkin Ina. Attempted to call Justin-one number is not in service and unable to dial the other number per phone operator. Will attempt to get in touch with another support person. Will continue to follow.   ADDENDUM 1407: Briefly spoke with [name] who was able to provide Justin's number [phone]). Spoke with Larkin Ina on the telephone. He acknowledged understanding of CIR goals and expectations. He is supportive of pt pursuing CIR. He confirmed that he along with his mother [name] will be able to provide intermittent support for pt after discharge. He also mentioned that [facility] 10[facility].    Signed: Gayland Curry, [place], [place] Admissions Coordinator [phone]

## 2021-10-28 NOTE — Progress Notes (Signed)
  Subjective: Patient stable.  Pain controlled.  Was able to ambulate in the room with physical therapy assist and prosthetic in place   Objective: Vital signs in last 24 hours: Temp:  [97.6 F (36.4 C)-98.5 F (36.9 C)] 98 F (36.7 C) (10/22 0826) Pulse Rate:  [78-89] 78 (10/22 0826) Resp:  [11-18] 18 (10/22 0826) BP: (122-144)/(70-81) 126/70 (10/22 0826) SpO2:  [95 %-100 %] 99 % (10/22 0826)  Intake/Output from previous day: 10/21 0701 - 10/22 0700 In: 1050 [P.O.:100; I.V.:850; IV Piggyback:100] Out: 300 [Blood:300] Intake/Output this shift: No intake/output data recorded.  Exam:  No cellulitis present  Labs: Recent Labs    10/26/21 1954 10/27/21 0527  HGB 13.7 13.0   Recent Labs    10/26/21 1954 10/27/21 0527  WBC 11.8* 13.4*  RBC 4.78 4.64  HCT 41.4 40.3  PLT 222 228   Recent Labs    10/26/21 2200 10/27/21 0527  NA 136 134*  K 4.0 4.2  CL 104 100  CO2 24 24  BUN 15 14  CREATININE 1.21 1.19  GLUCOSE 225* 253*  CALCIUM 8.6* 8.7*   Recent Labs    10/26/21 1954  INR 1.0    Assessment/Plan: Plan at this time is to continue with mobilization partial weightbearing 50% as tolerated.  From orthopedic standpoint he is ready for discharge to skilled nursing or rehab tomorrow. 50% weightbearing as tolerated with walker left leg Aspirin 81 mg by mouth twice a day for DVT prophylaxis x3 weeks Oxycodone for pain 1 p.o. every 4-6 hours as needed pain   G Alphonzo Severance 10/28/2021, 3:11 PM

## 2021-10-29 ENCOUNTER — Encounter (HOSPITAL_COMMUNITY): Payer: Self-pay | Admitting: Orthopedic Surgery

## 2021-10-29 DIAGNOSIS — S72002A Fracture of unspecified part of neck of left femur, initial encounter for closed fracture: Secondary | ICD-10-CM | POA: Diagnosis not present

## 2021-10-29 LAB — GLUCOSE, CAPILLARY
Glucose-Capillary: 141 mg/dL — ABNORMAL HIGH (ref 70–99)
Glucose-Capillary: 277 mg/dL — ABNORMAL HIGH (ref 70–99)
Glucose-Capillary: 174 mg/dL — ABNORMAL HIGH (ref 70–99)
Glucose-Capillary: 257 mg/dL — ABNORMAL HIGH (ref 70–99)

## 2021-10-29 MED ORDER — GABAPENTIN 800 MG PO TABS: 800.0000 mg | ORAL_TABLET | Freq: Every day | ORAL | Status: DC

## 2021-10-29 MED ORDER — MONTELUKAST SODIUM 5 MG PO CHEW
5.0000 mg | CHEWABLE_TABLET | Freq: Every day | ORAL | Status: DC
  Administered 2021-10-29: 5 mg via ORAL
  Filled 2021-10-29 (×2): qty 1

## 2021-10-29 MED ORDER — INSULIN ASPART 100 UNIT/ML IJ SOLN
4.0000 [IU] | Freq: Three times a day (TID) | INTRAMUSCULAR | Status: DC
  Administered 2021-10-29 – 2021-10-30 (×4): 4 [IU] via SUBCUTANEOUS

## 2021-10-29 MED ORDER — DONEPEZIL HCL 10 MG PO TABS
10.0000 mg | ORAL_TABLET | Freq: Every day | ORAL | Status: DC
  Administered 2021-10-29: 10 mg via ORAL
  Filled 2021-10-29: qty 1

## 2021-10-29 MED ORDER — POLYETHYLENE GLYCOL 3350 17 G PO PACK
17.0000 g | PACK | Freq: Two times a day (BID) | ORAL | Status: DC
  Administered 2021-10-29 – 2021-10-30 (×2): 17 g via ORAL
  Filled 2021-10-29 (×3): qty 1

## 2021-10-29 MED ORDER — GABAPENTIN 400 MG PO CAPS
800.0000 mg | ORAL_CAPSULE | Freq: Every day | ORAL | Status: DC
  Administered 2021-10-29: 800 mg via ORAL
  Filled 2021-10-29: qty 2

## 2021-10-29 MED ORDER — ATORVASTATIN CALCIUM 40 MG PO TABS
40.0000 mg | ORAL_TABLET | Freq: Every day | ORAL | Status: DC
  Administered 2021-10-29: 40 mg via ORAL
  Filled 2021-10-29: qty 1

## 2021-10-29 MED ORDER — ALFUZOSIN HCL ER 10 MG PO TB24
10.0000 mg | ORAL_TABLET | Freq: Every day | ORAL | Status: DC
  Administered 2021-10-30: 10 mg via ORAL
  Filled 2021-10-29: qty 1

## 2021-10-29 MED ORDER — GABAPENTIN 600 MG PO TABS
600.0000 mg | ORAL_TABLET | Freq: Every morning | ORAL | Status: DC
  Administered 2021-10-30: 600 mg via ORAL
  Filled 2021-10-29: qty 1

## 2021-10-29 MED ORDER — SORBITOL 70 % SOLN
15.0000 mL | Freq: Once | Status: DC
  Filled 2021-10-29: qty 30

## 2021-10-29 MED ORDER — INSULIN DETEMIR 100 UNIT/ML ~~LOC~~ SOLN
6.0000 [IU] | Freq: Two times a day (BID) | SUBCUTANEOUS | Status: DC
  Administered 2021-10-29 – 2021-10-30 (×3): 6 [IU] via SUBCUTANEOUS
  Filled 2021-10-29 (×5): qty 0.06

## 2021-10-29 NOTE — Progress Notes (Addendum)
[facility] PROGRESS NOTE    Progress Note  Billy Torres  YBO:[id number] DOB: August 31, 1955 DOA: 10/26/2021 PCP: Patient, No Pcp Per     Brief Narrative:   Billy Torres is an 66 y.o. male past medical history of ischemic CVA, diabetes mellitus type 2 with peripheral neuropathy, status post left BKA in April 2021 admitted to [facility] after mechanical fall, hip x-ray showed an acute comminuted intertrochanteric and subtrochanteric fracture of the left femur orthopedic surgery was consulted Dr. Marlou Sa   Assessment/Plan:   Acute Closed left hip fracture: Orthopedic surgery was consulted he is status post left intratrochanteric medullary nailing on 10/27/2021 Physical therapy evaluated the patient recommended inpatient rehab. Ortho recommended 50% weightbearing  as tolerated with a walker. Continue narcotics for pain. Is medically stable for transfer.  Mechanical fall: He relates he trips no loss of consciousness. We will need him to rehab.  Leukocytosis: Leukocytosis improving today continue to hold antibiotics.  HLD (hyperlipidemia) Continue statins.  Diabetes mellitus due to underlying condition with complication, without long-term current use  of insulin: Continue to hold metformin, A1c today is 10.6 He is currently n.p.o. placed on sliding scale insulin. We will add long-acting insulin continue CBGs every 4 hours.  History of CVA with significant left residual hemiparesis: Noted.  BPH: Creatinine at baseline holding home alpha blockers can resume postsurgical.  Stage I sacral decubitus ulcer: RN Pressure Injury Documentation: Pressure Injury 05/20/19 Toe (Comment  which one) Right Deep Tissue Pressure Injury - Purple or maroon localized area of discolored intact skin or blood-filled blister due to damage of underlying soft tissue from pressure and/or shear. (Active)  05/20/19 1715  Location: Toe (Comment  which one)  Location Orientation: Right   Staging: Deep Tissue Pressure Injury - Purple or maroon localized area of discolored intact skin or blood-filled blister due to damage of underlying soft tissue from pressure and/or shear.  Wound Description (Comments):   Present on Admission: Yes     Pressure Injury 05/20/19 Coccyx Medial Stage 1 -  Intact skin with non-blanchable redness of a localized area usually over a bony prominence. (Active)  05/20/19 1715  Location: Coccyx  Location Orientation: Medial  Staging: Stage 1 -  Intact skin with non-blanchable redness of a localized area usually over a bony prominence.  Wound Description (Comments):   Present on Admission: Yes     Pressure Injury 05/27/19 Heel Right Deep Tissue Pressure Injury - Purple or maroon localized area of discolored intact skin or blood-filled blister due to damage of underlying soft tissue from pressure and/or shear. (Active)  05/27/19 0900  Location: Heel  Location Orientation: Right  Staging: Deep Tissue Pressure Injury - Purple or maroon localized area of discolored intact skin or blood-filled blister due to damage of underlying soft tissue from pressure and/or shear.  Wound Description (Comments):   Present on Admission:     Estimated body mass index is 23.57 kg/m as calculated from the following:   Height as of this encounter: '5\' 8"'$  (1.727 m).   Weight as of this encounter: 70.3 kg.  DVT prophylaxis: lovenox Family Communication:none Status is: Inpatient Remains inpatient appropriate because: Acute left comminuted fracture    Code Status:     Code Status Orders  (From admission, onward)           Start     Ordered   10/26/21 2348  Full code  Continuous        10/26/21 2347  Code Status History     Date Active Date Inactive Code Status Order ID Comments User Context   05/20/2019 1724 05/28/2019 2059 Full Code [id number]  Juanito Doom, MD Inpatient   05/20/2019 1117 05/20/2019 1724 Full Code [id number]  Ina Homes, MD ED   04/28/2019 1514 04/30/2019 2104 Full Code [id number]  Persons, Bevely Palmer, [place] Inpatient   04/02/2019 0839 04/02/2019 1606 Full Code [id number]  Angelia Mould, MD Inpatient   12/11/2018 2032 12/12/2018 1905 Full Code [id number]  Angelia Mould, MD Inpatient   08/15/2016 1652 08/16/2016 2253 Full Code [id number]  Zada Finders, MD Inpatient   05/17/2015 1634 05/31/2015 2038 Full Code [id number]  Flora Lipps Inpatient   05/17/2015 1634 05/17/2015 1634 Full Code [id number]  Bary Leriche, PA-C Inpatient   05/14/2015 1838 05/17/2015 1634 Full Code [id number]  Aquilla Hacker, MD Inpatient   06/27/2013 2128 06/29/2013 2059 Full Code [id number]  Elmarie Shiley, MD Inpatient         IV Access:   Peripheral IV   Procedures and diagnostic studies:   DG FEMUR PORT MIN 2 VIEWS LEFT  Result Date: 10/27/2021 CLINICAL DATA:  Status post left femoral ORIF. EXAM: LEFT FEMUR PORTABLE 2 VIEWS COMPARISON:  Pelvic radiograph dated 10/26/2021. FINDINGS: Status post ORIF of the left femur with intramedullary nail and transcervical screw and cerclage wire. The hardware is intact. The bones are osteopenic. There is no dislocation. Prostate brachytherapy seeds noted. Vascular calcifications. IMPRESSION: Status post ORIF of the left femur. Electronically Signed   By: Anner Crete M.D.   On: 10/27/2021 18:49   DG FEMUR MIN 2 VIEWS LEFT  Result Date: 10/27/2021 CLINICAL DATA:  IM nail femur. EXAM: LEFT FEMUR 2 VIEWS; DG C-ARM 1-60 MIN-NO REPORT COMPARISON:  Plain film of the LEFT femur dated 10/26/2021. FINDINGS: Six intraoperative fluoroscopic images are provided. Images demonstrate placement of intramedullary rod with dynamic hip screws traversing the LEFT femoral neck fracture site. Hardware appears intact and appropriately positioned. Fluoroscopy provided for 119 seconds. Dose: 21.68 mGy IMPRESSION: Intraoperative fluoroscopic images, as detailed above. No evidence of surgical  complicating feature. Electronically Signed   By: Franki Cabot M.D.   On: 10/27/2021 17:36   DG C-Arm 1-60 Min-No Report  Result Date: 10/27/2021 Fluoroscopy was utilized by the requesting physician.  No radiographic interpretation.   DG C-Arm 1-60 Min-No Report  Result Date: 10/27/2021 Fluoroscopy was utilized by the requesting physician.  No radiographic interpretation.     Medical Consultants:   None.   Subjective:    Billy Torres pain controlled has not had a bowel movement.  Objective:    Vitals:   10/28/21 0412 10/28/21 0826 10/28/21 1720 10/28/21 2236  BP: 128/72 126/70 133/65 138/71  Pulse: 83 78 78 84  Resp:  '18 16 18  '$ Temp:  98 F (36.7 C) 98 F (36.7 C) 98.5 F (36.9 C)  TempSrc:   Oral   SpO2: 100% 99% 95% 98%  Weight:      Height:       SpO2: 98 %  No intake or output data in the 24 hours ending 10/29/21 0805  Filed Weights   10/27/21 1438  Weight: 70.3 kg    Exam: General exam: In no acute distress. Respiratory system: Good air movement and clear to auscultation. Cardiovascular system: S1 & S2 heard, RRR. No JVD. Gastrointestinal system: Abdomen is nondistended, soft and nontender.  Skin: No rashes, lesions or ulcers  Data Reviewed:  Labs: Basic Metabolic Panel: Recent Labs  Lab 10/26/21 2200 10/27/21 0527  NA 136 134*  K 4.0 4.2  CL 104 100  CO2 24 24  GLUCOSE 225* 253*  BUN 15 14  CREATININE 1.21 1.19  CALCIUM 8.6* 8.7*  MG  --  1.4*    GFR Estimated Creatinine Clearance: 58.3 mL/min (by C-G formula based on SCr of 1.19 mg/dL). Liver Function Tests: Recent Labs  Lab 10/27/21 0527  AST 27  ALT 29  ALKPHOS 62  BILITOT 0.8  PROT 6.6  ALBUMIN 3.4*    No results for input(s): "LIPASE", "AMYLASE" in the last 168 hours. No results for input(s): "AMMONIA" in the last 168 hours. Coagulation profile Recent Labs  Lab 10/26/21 1954  INR 1.0    COVID-19 Labs  No results for input(s): "DDIMER", "FERRITIN",  "LDH", "CRP" in the last 72 hours.  Lab Results  Component Value Date   SARSCOV2NAA POSITIVE (A) 12/29/2020   SARSCOV2NAA NEGATIVE 12/16/2019   [place] NEGATIVE 05/28/2019   [place] NEGATIVE 05/20/2019    CBC: Recent Labs  Lab 10/26/21 1954 10/27/21 0527  WBC 11.8* 13.4*  NEUTROABS 9.6* 11.8*  HGB 13.7 13.0  HCT 41.4 40.3  MCV 86.6 86.9  PLT 222 228    Cardiac Enzymes: No results for input(s): "CKTOTAL", "CKMB", "CKMBINDEX", "TROPONINI" in the last 168 hours. BNP (last 3 results) No results for input(s): "PROBNP" in the last 8760 hours. CBG: Recent Labs  Lab 10/28/21 1113 10/28/21 1117 10/28/21 1635 10/28/21 2023 10/29/21 0757  GLUCAP 213* 213* 108* 146* 141*    D-Dimer: No results for input(s): "DDIMER" in the last 72 hours. Hgb A1c: Recent Labs    10/27/21 0527  HGBA1C 10.6*    Lipid Profile: No results for input(s): "CHOL", "HDL", "LDLCALC", "TRIG", "CHOLHDL", "LDLDIRECT" in the last 72 hours. Thyroid function studies: No results for input(s): "TSH", "T4TOTAL", "T3FREE", "THYROIDAB" in the last 72 hours.  Invalid input(s): "FREET3" Anemia work up: No results for input(s): "VITAMINB12", "FOLATE", "FERRITIN", "TIBC", "IRON", "RETICCTPCT" in the last 72 hours. Sepsis Labs: Recent Labs  Lab 10/26/21 1954 10/27/21 0527  WBC 11.8* 13.4*    Microbiology No results found for this or any previous visit (from the past 240 hour(s)).   Medications:    aspirin  81 mg Oral BID   docusate sodium  100 mg Oral BID   insulin aspart  0-15 Units Subcutaneous TID WC   insulin aspart  0-5 Units Subcutaneous QHS   insulin aspart  3 Units Subcutaneous TID WC   insulin detemir  5 Units Subcutaneous BID   pantoprazole  40 mg Oral Daily   Continuous Infusions:  methocarbamol (ROBAXIN) IV        LOS: 3 days   Charlynne Cousins  [facility]  10/29/2021, 8:05 AM

## 2021-10-29 NOTE — Progress Notes (Signed)
Physical Therapy Treatment Patient Details Name: Billy Torres MRN: [id number] DOB: May 27, 1955 Today's Date: 10/29/2021   History of Present Illness Pt is a 66 y.o. M who presents following a fall with left hip intertrochanteric/subtrochanteric femur fx now s/p ORIF 10/27/2021. Significant PMH: CKD, stroke, L BKA, PAD, prostate CA, DM2, sensorineural hearing loss.    PT Comments    Pt progressing steadily towards his physical therapy goals. Requiring less assist with bed mobility this date and able to tolerate donning left prosthetic with assist. Pt requiring two person minimal assist for functional mobility. Able to walk ~3 ft with RW, maintaining partial weightbearing through LLE. Continues with LLE weakness, impaired standing balance, gait abnormalities, and pain. Will benefit from post acute rehab to address.    Recommendations for follow up therapy are one component of a multi-disciplinary discharge planning process, led by the attending physician.  Recommendations may be updated based on patient status, additional functional criteria and insurance authorization.  Follow Up Recommendations  Skilled nursing-short term rehab (<3 hours/day) Can patient physically be transported by private vehicle: Yes   Assistance Recommended at Discharge Frequent or constant Supervision/Assistance  Patient can return home with the following A lot of help with walking and/or transfers;A lot of help with bathing/dressing/bathroom;Assistance with cooking/housework;Assist for transportation;Help with stairs or ramp for entrance   Equipment Recommendations  Rolling walker (2 wheels);BSC/3in1    Recommendations for Other Services       Precautions / Restrictions Precautions Precautions: Fall Restrictions Weight Bearing Restrictions: Yes LLE Weight Bearing: Partial weight bearing LLE Partial Weight Bearing Percentage or Pounds: 50     Mobility  Bed Mobility Overal bed mobility: Needs  Assistance Bed Mobility: Supine to Sit     Supine to sit: Min assist     General bed mobility comments: Improved initiation, good use of rail, requiring minA at trunk to execute    Transfers Overall transfer level: Needs assistance Equipment used: Rolling walker (2 wheels) Transfers: Sit to/from Stand Sit to Stand: Min assist, +2 physical assistance           General transfer comment: MinA + 2 to power up from edge of bed    Ambulation/Gait Ambulation/Gait assistance: Min assist, +2 physical assistance Gait Distance (Feet): 3 Feet Assistive device: Rolling walker (2 wheels) Gait Pattern/deviations: Step-to pattern, Decreased weight shift to left, Antalgic Gait velocity: decreased     General Gait Details: Cues provided for sequencing/technique and walker use, minA for balance, chair pulled up behind pt due to fatigue/pain. Partially WB through LLE with prosthetic donned   Stairs             Wheelchair Mobility    Modified Rankin (Stroke Patients Only)       Balance Overall balance assessment: Needs assistance Sitting-balance support: Feet unsupported Sitting balance-Leahy Scale: Fair Sitting balance - Comments: tendency to guard away from L side   Standing balance support: Bilateral upper extremity supported Standing balance-Leahy Scale: Poor Standing balance comment: reliant on RW                            Cognition Arousal/Alertness: Awake/alert Behavior During Therapy: Flat affect Overall Cognitive Status: Impaired/Different from baseline Area of Impairment: Following commands, Problem solving                       Following Commands: Follows one step commands with increased time     Problem Solving: Slow  processing, Requires verbal cues General Comments: Pt with hx of cognitive impairment following stroke and is also HOH, thus impacting assessment. Delayed processing        Exercises General Exercises - Lower  Extremity Quad Sets: Left, 10 reps, Seated Long Arc Quad: AROM, AAROM, Both, 10 reps, Seated Hip Flexion/Marching: Right, 10 reps, Seated    General Comments        Pertinent Vitals/Pain Pain Assessment Pain Assessment: Faces Faces Pain Scale: Hurts even more Pain Location: LLE with movement Pain Descriptors / Indicators: Grimacing, Operative site guarding, Discomfort Pain Intervention(s): Limited activity within patient's tolerance, Monitored during session, Premedicated before session    Home Living                          Prior Function            PT Goals (current goals can now be found in the care plan section) Acute Rehab PT Goals Patient Stated Goal: did not state Potential to Achieve Goals: Good Progress towards PT goals: Progressing toward goals    Frequency    Min 3X/week      PT Plan Discharge plan needs to be updated;Frequency needs to be updated    Co-evaluation              AM-PAC PT "6 Clicks" Mobility   Outcome Measure  Help needed turning from your back to your side while in a flat bed without using bedrails?: A Little Help needed moving from lying on your back to sitting on the side of a flat bed without using bedrails?: A Little Help needed moving to and from a bed to a chair (including a wheelchair)?: A Little Help needed standing up from a chair using your arms (e.g., wheelchair or bedside chair)?: A Little Help needed to walk in hospital room?: Total Help needed climbing 3-5 steps with a railing? : Total 6 Click Score: 14    End of Session Equipment Utilized During Treatment: Gait belt Activity Tolerance: Patient tolerated treatment well Patient left: in chair;with call bell/phone within reach Nurse Communication: Mobility status PT Visit Diagnosis: Pain;Difficulty in walking, not elsewhere classified (R26.2) Pain - Right/Left: Left Pain - part of body: Leg     Time: 3976-7341 PT Time Calculation (min) (ACUTE  ONLY): 16 min  Charges:  $Therapeutic Activity: 8-22 mins                     Wyona Almas, PT, DPT Acute Rehabilitation Services Office [phone]    [name] 10/29/2021, 2:42 PM

## 2021-10-29 NOTE — Progress Notes (Signed)
CSW received call from Emily/social work Librarian, academic at [employer].  They are not going to approve request for CIR and want pt to go to SNF.  They have informed the pt and family already.  Regular social worker will be back tomorrow: Emmie Niemann. Lurline Idol, MSW, LCSW 10/23/20232:33 PM

## 2021-10-29 NOTE — Progress Notes (Signed)
Inpatient Rehab Admissions Coordinator:  Notified pt that began insurance authorization. Will continue to follow.   Gayland Curry, [place], [place] Admissions Coordinator [phone]

## 2021-10-29 NOTE — [place] FL2 (Signed)
[place] MEDICAID FL2 LEVEL OF CARE SCREENING TOOL     IDENTIFICATION  Patient Name: Billy Torres: 11-02-1955 Sex: male Admission Date (Current Location): 10/26/2021  [facility] and [place] Number:  Herbalist and Address:  [facility] [place], [place] [address] [place], [place] [address]      Provider Number: [id number]  Attending Physician Name and Address:  Aileen Fass, [name], MD  Relative Name and Phone Number:  Kara, Mierzejewski   [phone]    Current Level of Care: Hospital Recommended Level of Care: [place] Prior Approval Number:    Date Approved/Denied:   PASRR Number: [id number]  Discharge Plan: SNF    Current Diagnoses: Patient Active Problem List   Diagnosis Date Noted   Fall at home, initial encounter 10/27/2021   BPH (benign prostatic hyperplasia) 10/27/2021   GERD (gastroesophageal reflux disease) 10/27/2021   Allergic rhinitis 10/27/2021   Closed left hip fracture ([place]) 10/26/2021   Unintentional weight loss 12/12/2020   Diarrhea 12/12/2020   H. pylori infection 12/12/2020   Cecal polyp 08/15/2019   History of rectal ulcer 08/15/2019   History of anemia 08/15/2019   History of colonic polyps 08/15/2019   Acute blood loss anemia 05/25/2019   Hematochezia 05/25/2019   Aortic atherosclerosis ([place]) 05/25/2019   Pressure injury of skin 05/21/2019   Acquired absence of left leg below knee ([place]) 05/05/2019   Acute osteomyelitis of left foot ([place]) 04/28/2019   Cutaneous abscess of left foot    PVD (peripheral vascular disease) ([place]) 12/11/2018   Hydronephrosis due to obstruction of bladder    Acute urinary retention 08/15/2016   Malignant neoplasm of prostate ([place]) 02/26/2016   Muscle spasticity    Dementia (HCC)    Cerebral infarction due to unspecified mechanism    Leukocytosis    Acute thrombotic stroke ([place]) 05/17/2015   Diabetes mellitus due to underlying condition with  complication, without long-term current use of insulin (HCC)    Gait disturbance, post-stroke    Hemiparesis (HCC)    Cognitive deficit due to old cerebral infarction    Benign essential HTN    Diabetes mellitus type 2 in nonobese [facility])    Essential hypertension    History of CVA (cerebrovascular accident) without residual deficits    Ataxia, post-stroke    Dysarthria, post-stroke    Tobacco abuse    HLD (hyperlipidemia)    Acute CVA (cerebrovascular accident) ([place]) 05/15/2015   Left-sided weakness 05/14/2015   Right shoulder pain 03/03/2014   History of CVA (cerebrovascular accident) 02/28/2014   Smoking 06/29/2013   CVA (cerebral infarction) 06/29/2013   Dyslipidemia 06/29/2013   TIA (transient ischemic attack) 06/27/2013   Diabetes ([place]) 06/27/2013    Orientation RESPIRATION BLADDER Height & Weight     Self, Time, Situation, Place  Normal Incontinent Weight: 155 lb (70.3 kg) Height:  '5\' 8"'$  (172.7 cm)  BEHAVIORAL SYMPTOMS/MOOD NEUROLOGICAL BOWEL NUTRITION STATUS      Continent Diet (see discharge summary)  AMBULATORY STATUS COMMUNICATION OF NEEDS Skin   Total Care Verbally Surgical wounds                       Personal Care Assistance Level of Assistance  Bathing, Feeding, Dressing Bathing Assistance: Maximum assistance Feeding assistance: Limited assistance Dressing Assistance: Maximum assistance     Functional Limitations Info  Sight, Hearing, Speech Sight Info: Adequate Hearing Info: Adequate Speech Info: Adequate    SPECIAL CARE FACTORS FREQUENCY  PT (By licensed PT), OT (By licensed OT)     PT Frequency: 5x week OT Frequency: 5x week            Contractures Contractures Info: Not present    Additional Factors Info  Code Status, Allergies, Insulin Sliding Scale Code Status Info: full Allergies Info: NKA   Insulin Sliding Scale Info: Novolog: see discharge summary       Current Medications (10/29/2021):  This is the current hospital  active medication list Current Facility-Administered Medications  Medication Dose Route Frequency Provider Last Rate Last Admin   acetaminophen (TYLENOL) tablet 650 mg  650 mg Oral Q6H PRN Howerter, Justin B, DO       Or   acetaminophen (TYLENOL) suppository 650 mg  650 mg Rectal Q6H PRN Howerter, Justin B, DO       acetaminophen (TYLENOL) tablet 325-650 mg  325-650 mg Oral Q6H PRN Magnant, Charles L, PA-C       [START ON 10/30/2021] alfuzosin (UROXATRAL) 24 hr tablet 10 mg  10 mg Oral Q breakfast Charlynne Cousins, MD       aspirin chewable tablet 81 mg  81 mg Oral BID Charlynne Cousins, MD   81 mg at 10/29/21 7062   atorvastatin (LIPITOR) tablet 40 mg  40 mg Oral QHS Charlynne Cousins, MD       docusate sodium (COLACE) capsule 100 mg  100 mg Oral BID Magnant, Charles L, PA-C   100 mg at 10/29/21 3762   donepezil (ARICEPT) tablet 10 mg  10 mg Oral QHS Charlynne Cousins, MD       gabapentin (NEURONTIN) capsule 800 mg  800 mg Oral QHS Charlynne Cousins, MD       [START ON 10/30/2021] gabapentin (NEURONTIN) tablet 600 mg  600 mg Oral q AM Charlynne Cousins, MD       insulin aspart (novoLOG) injection 0-15 Units  0-15 Units Subcutaneous TID WC Charlynne Cousins, MD   8 Units at 10/29/21 1248   insulin aspart (novoLOG) injection 0-5 Units  0-5 Units Subcutaneous QHS Charlynne Cousins, MD       insulin aspart (novoLOG) injection 4 Units  4 Units Subcutaneous TID WC Charlynne Cousins, MD   4 Units at 10/29/21 1248   insulin detemir (LEVEMIR) injection 6 Units  6 Units Subcutaneous BID Charlynne Cousins, MD   6 Units at 10/29/21 1134   menthol-cetylpyridinium (CEPACOL) lozenge 3 mg  1 lozenge Oral PRN Magnant, Charles L, PA-C       Or   phenol (CHLORASEPTIC) mouth spray 1 spray  1 spray Mouth/Throat PRN Magnant, Charles L, PA-C       methocarbamol (ROBAXIN) tablet 500 mg  500 mg Oral Q6H PRN Magnant, Charles L, PA-C   500 mg at 10/28/21 2233   Or   methocarbamol  (ROBAXIN) 500 mg in dextrose 5 % 50 mL IVPB  500 mg Intravenous Q6H PRN Magnant, Charles L, PA-C       metoCLOPramide (REGLAN) tablet 5-10 mg  5-10 mg Oral Q8H PRN Magnant, Charles L, PA-C       Or   metoCLOPramide (REGLAN) injection 5-10 mg  5-10 mg Intravenous Q8H PRN Magnant, Charles L, PA-C       montelukast (SINGULAIR) chewable tablet 5 mg  5 mg Oral QHS Charlynne Cousins, MD       naloxone [facility]) injection 0.4 mg  0.4 mg Intravenous PRN Howerter, Justin B, DO  ondansetron (ZOFRAN) injection 4 mg  4 mg Intravenous Q6H PRN Howerter, Justin B, DO       ondansetron (ZOFRAN) tablet 4 mg  4 mg Oral Q6H PRN Magnant, Charles L, PA-C       Or   ondansetron (ZOFRAN) injection 4 mg  4 mg Intravenous Q6H PRN Magnant, Charles L, PA-C       oxyCODONE (Oxy IR/ROXICODONE) immediate release tablet 5-10 mg  5-10 mg Oral Q4H PRN Magnant, Charles L, PA-C   10 mg at 10/29/21 1131   pantoprazole (PROTONIX) EC tablet 40 mg  40 mg Oral Daily Einar Grad, RPH   40 mg at 10/29/21 9390   polyethylene glycol (MIRALAX / GLYCOLAX) packet 17 g  17 g Oral BID Charlynne Cousins, MD       sorbitol 70 % solution 15 mL  15 mL Oral Once Charlynne Cousins, MD         Discharge Medications: Please see discharge summary for a list of discharge medications.  Relevant Imaging Results:  Relevant Lab Results:   Additional Information SSN: [id number]. Pt reports he is vaccinated for covid, unsure if boosted.  Joanne Chars, LCSW

## 2021-10-29 NOTE — Progress Notes (Signed)
Inpatient Rehab Admissions Coordinator:  Received a message from [facility] that [facility] has denied CIR. Attempted to contact [facility] to confirm. Have not received a response. Called pt's son Larkin Ina to let him know.   Gayland Curry, [place], [place] Admissions Coordinator [phone]

## 2021-10-29 NOTE — Inpatient Diabetes Management (Addendum)
Inpatient Diabetes Program Recommendations  AACE/ADA: New Consensus Statement on Inpatient Glycemic Control (2015)  Target Ranges:  Prepandial:   less than 140 mg/dL      Peak postprandial:   less than 180 mg/dL (1-2 hours)      Critically ill patients:  140 - 180 mg/dL    Latest Reference Range & Units 10/27/21 05:27  Hemoglobin A1C 4.8 - 5.6 % 10.6 (H)  257  mg/dl  (H): Data is abnormally high  Latest Reference Range & Units 10/28/21 00:21 10/28/21 04:11 10/28/21 08:44 10/28/21 11:13 10/28/21 11:17 10/28/21 16:35 10/28/21 20:23  Glucose-Capillary 70 - 99 mg/dL 182 (H)  3 units Novolog  167 (H)  3 units Novolog  135 (H)  2 units Novolog  5 units Levemir  213 (H)  8 units Novolog  213 (H) 108 (H)  3 units Novolog  146 (H)    5 units Levemir _0   (H): Data is abnormally high  Latest Reference Range & Units 10/29/21 07:57  Glucose-Capillary 70 - 99 mg/dL 141 (H)  (H): Data is abnormally high   Admit with: Hip Fracture after Fall  History: DM2, L BKA, CVA  Home DM Meds:Trulicity 4.94 mg Qweek       Metformin 500 mg BID       Rybelsus 7 mg Daily  Current Orders: Novolog Moderate Correction Scale/ SSI (0-15 units) TID AC + HS     Novolog 4 units TID with meals     Levemir 6 units BID    May go to Inpatient Rehab after d/c  Note Levemir and Novolog Meal Coverage insulins both increased this AM   Addendum 12:15pm--Met w/ pt at bedside.  Pt was able to answer all my questions, however, he was very quiet and did not elaborate with much detail to my questions.  Was able to verify that pt takes the above listed diabetes meds.  Unsure if pt taking his meds consistently?  Reviewed current A1c of 10.6% with pt--Explained what an A1c is and what it measures.  Reminded patient that his goal A1c is 7% or less per ADA standards to prevent both acute and long-term complications.  Explained to patient the extreme importance of good glucose control at home.  Encouraged  patient to check his CBGs at least bid at home (fasting and another check within the day) and to record all CBGs in a logbook for his PCP to review.  Unsure if pt will need to add insulin to home regimen yet?  Will follow up when more appropriate.    --Will follow patient during hospitalization--  Wyn Quaker RN, MSN, [facility] Diabetes Coordinator Inpatient Glycemic Control Team Team Pager: [phone] (8a-5p)

## 2021-10-29 NOTE — TOC Initial Note (Signed)
Transition of Care [facility]) - Initial/Assessment Note    Patient Details  Name: Billy Torres MRN: [id number] Date of Birth: [birth year]  Transition of Care [facility]) CM/SW Contact:    Joanne Chars, [place] Phone Number: 10/29/2021, 3:28 PM  Clinical Narrative:   CSW spoke with pt regarding denial for CIR by [facility] and their recommendation that he go to SNF.  Pt is agreeable, discussed [facility] SNF options: [facility], [place], [place], [place].  Pt lives with son Larkin Ina, permission given to speak with him, [facility] services are only current services.  Referral sent out in hub for SNF                 Expected Discharge Plan: [place] Barriers to Discharge: Continued Medical Work up   Patient Goals and CMS Choice Patient states their goals for this hospitalization and ongoing recovery are:: walking   Choice offered to / list presented to : Patient ([facility] pt: choices are [facility], [place], [place], [place]-discussed this with pt)  Expected Discharge Plan and Services Expected Discharge Plan: [place] In-house Referral: Clinical Social Work   Post Acute Care Choice: [place] Living arrangements for the past 2 months: [place]                                      Prior Living Arrangements/Services Living arrangements for the past 2 months: Single Family Home Lives with:: Adult Children (son Larkin Ina) Patient language and need for interpreter reviewed:: Yes Do you feel safe going back to the place where you live?: Yes      Need for Family Participation in Patient Care: Yes (Comment) Care giver support system in place?: Yes (comment) Current home services: Other (comment) ([facility]) Criminal Activity/Legal Involvement Pertinent to Current Situation/Hospitalization: No - Comment as needed  Activities of Daily Living      Permission Sought/Granted Permission sought to share information with : Family  Supports Permission granted to share information with : Yes, Verbal Permission Granted  Share Information with NAME: son Larkin Ina  Permission granted to share info w AGENCY: SNF        Emotional Assessment Appearance:: Appears stated age Attitude/Demeanor/Rapport: Engaged Affect (typically observed): Appropriate, Pleasant Orientation: : Oriented to Self, Oriented to Place, Oriented to  Time, Oriented to Situation Alcohol / Substance Use: Not Applicable Psych Involvement: No (comment)  Admission diagnosis:  Closed left hip fracture ([place]) [S72.002A] Displaced intertrochanteric fracture of left femur, initial encounter for closed fracture [facility]) [S72.142A] Patient Active Problem List   Diagnosis Date Noted   Fall at home, initial encounter 10/27/2021   BPH (benign prostatic hyperplasia) 10/27/2021   GERD (gastroesophageal reflux disease) 10/27/2021   Allergic rhinitis 10/27/2021   Closed left hip fracture ([place]) 10/26/2021   Unintentional weight loss 12/12/2020   Diarrhea 12/12/2020   H. pylori infection 12/12/2020   Cecal polyp 08/15/2019   History of rectal ulcer 08/15/2019   History of anemia 08/15/2019   History of colonic polyps 08/15/2019   Acute blood loss anemia 05/25/2019   Hematochezia 05/25/2019   Aortic atherosclerosis ([place]) 05/25/2019   Pressure injury of skin 05/21/2019   Acquired absence of left leg below knee ([place]) 05/05/2019   Acute osteomyelitis of left foot ([place]) 04/28/2019   Cutaneous abscess of left foot    PVD (peripheral vascular disease) ([place]) 12/11/2018   Hydronephrosis due to obstruction of bladder  Acute urinary retention 08/15/2016   Malignant neoplasm of prostate ([place]) 02/26/2016   Muscle spasticity    Dementia ([place])    Cerebral infarction due to unspecified mechanism    Leukocytosis    Acute thrombotic stroke ([place]) 05/17/2015   Diabetes mellitus due to underlying condition with complication, without long-term current use of insulin (HCC)     Gait disturbance, post-stroke    Hemiparesis (HCC)    Cognitive deficit due to old cerebral infarction    Benign essential HTN    Diabetes mellitus type 2 in nonobese [facility])    Essential hypertension    History of CVA (cerebrovascular accident) without residual deficits    Ataxia, post-stroke    Dysarthria, post-stroke    Tobacco abuse    HLD (hyperlipidemia)    Acute CVA (cerebrovascular accident) ([place]) 05/15/2015   Left-sided weakness 05/14/2015   Right shoulder pain 03/03/2014   History of CVA (cerebrovascular accident) 02/28/2014   Smoking 06/29/2013   CVA (cerebral infarction) 06/29/2013   Dyslipidemia 06/29/2013   TIA (transient ischemic attack) 06/27/2013   Diabetes ([place]) 06/27/2013   PCP:  Patient, No Pcp Per Pharmacy:   [id number] [place] [id number]: [phone]: [phone] [id number] [place] [id number] [id number]: [phone]: [phone]   Social Determinants of Health (SDOH) Interventions    Readmission Risk Interventions     No data to display

## 2021-10-29 NOTE — Plan of Care (Signed)
  Problem: Education: Goal: Ability to describe self-care measures that may prevent or decrease complications (Diabetes Survival Skills Education) will improve Outcome: Adequate for Discharge Goal: Individualized Educational Video(s) Outcome: Adequate for Discharge   Problem: Coping: Goal: Ability to adjust to condition or change in health will improve Outcome: Adequate for Discharge

## 2021-10-30 DIAGNOSIS — S72002A Fracture of unspecified part of neck of left femur, initial encounter for closed fracture: Secondary | ICD-10-CM | POA: Diagnosis not present

## 2021-10-30 DIAGNOSIS — S72142A Displaced intertrochanteric fracture of left femur, initial encounter for closed fracture: Secondary | ICD-10-CM | POA: Diagnosis not present

## 2021-10-30 DIAGNOSIS — D72829 Elevated white blood cell count, unspecified: Secondary | ICD-10-CM | POA: Diagnosis not present

## 2021-10-30 DIAGNOSIS — E088 Diabetes mellitus due to underlying condition with unspecified complications: Secondary | ICD-10-CM | POA: Diagnosis not present

## 2021-10-30 LAB — GLUCOSE, CAPILLARY
Glucose-Capillary: 130 mg/dL — ABNORMAL HIGH (ref 70–99)
Glucose-Capillary: 233 mg/dL — ABNORMAL HIGH (ref 70–99)

## 2021-10-30 NOTE — Progress Notes (Signed)
Inpatient Rehab Admissions Coordinator:  Informed pt about insurance denial of CIR. AC will sign off.   Gayland Curry, [place], [place] Admissions Coordinator [phone]

## 2021-10-30 NOTE — TOC Progression Note (Signed)
Transition of Care [facility]) - Progression Note    Patient Details  Name: JAYANT KRIZ MRN: [id number] Date of Birth: 1955/06/07  [facility] of [facility]) CM/SW Contact  Joanne Chars, LCSW Phone Number: 10/30/2021, 12:09 PM  Clinical Narrative:   CSW spoke with pt and presented bed offers with choice document.  Pt would like to accept offer at [facility].  CSW confirmed with [facility] that they can accept pt today.  CSW informed by staff that pt son is trying to reach CSW for update, called bot sons, Larkin Ina and Nori Riis, unable to reach, unable to leave message.  CSW spoke with Raquel Sarna at [facility] and they will plan to transport pt at 3pm.    Expected Discharge Plan: [place] Barriers to Discharge: Continued Medical Work up  Expected Discharge Plan and Services Expected Discharge Plan: [place] In-house Referral: Clinical Social Work   Post Acute Care Choice: [place] Living arrangements for the past 2 months: Single Family Home Expected Discharge Date: 10/30/21                                     Social Determinants of Health (SDOH) Interventions    Readmission Risk Interventions     No data to display

## 2021-10-30 NOTE — TOC Transition Note (Signed)
Transition of Care [facility]) - CM/SW Discharge Note   Patient Details  Name: KOHNER ORLICK MRN: [id number] Date of Birth: 1955-05-28  Transition [facility]) CM/SW Contact:  Joanne Chars, LCSW Phone Number: 10/30/2021, 12:12 PM   Clinical Narrative:   Pt discharging to [facility].  RN call report to [phone].  [facility] will provide transportation at 3pm.     Final next level of care: [place] Barriers to Discharge: Barriers Resolved   Patient Goals and CMS Choice Patient states their goals for this hospitalization and ongoing recovery are:: walking   Choice offered to / list presented to : Patient ([facility] pt: choices are [facility], [place], [place], [place]-discussed this with pt)  Discharge Placement              Patient chooses bed at: [facility] Patient to be transferred to facility by: [facility] Name of family member notified: wife [name], attempted to call both sons Patient and family notified of of transfer: 10/30/21  Discharge Plan and Services In-house Referral: Clinical Social Work   Post Acute Care Choice: [place]                               Social Determinants of Health (SDOH) Interventions     Readmission Risk Interventions     No data to display

## 2021-10-30 NOTE — Progress Notes (Signed)
Occupational Therapy Treatment Patient Details Name: Billy Torres MRN: [id number] DOB: [birth year] Today's Date: 10/30/2021   History of present illness Pt is a 66 y.o. M who presents following a fall with left hip intertrochanteric/subtrochanteric femur fx now s/p ORIF 10/27/2021. Significant PMH: CKD, stroke, L BKA, PAD, prostate CA, DM2, sensorineural hearing loss.   OT comments  Patient received in supine and agreeable to OT/PT session. Patient required time and assistance with scooting hips to get to EOB. Patient demonstrated right lateral leaning on EOB and was able to correct with verbal cues and occasional min assist. Patient required mod/max assist to donn LLE prosthetic and min assist +2 to stand. Patient demonstrated difficulty with weight shifting during transfers requiring frequent verbal cues. Patient provided red therapy band and instructed in tricep exercises to assist with transfers and maintaining PWB. Discharge recommendations changed to SNF due to denied CIR. Acute OT to continue to follow.    Recommendations for follow up therapy are one component of a multi-disciplinary discharge planning process, led by the attending physician.  Recommendations may be updated based on patient status, additional functional criteria and insurance authorization.    Follow Up Recommendations  Skilled nursing-short term rehab (<3 hours/day)    Assistance Recommended at Discharge Frequent or constant Supervision/Assistance  Patient can return home with the following  A lot of help with walking and/or transfers;A lot of help with bathing/dressing/bathroom;Assistance with cooking/housework;Help with stairs or ramp for entrance;Assist for transportation   Equipment Recommendations  Other (comment) (TBD next venue)    Recommendations for Other Services      Precautions / Restrictions Precautions Precautions: Fall Restrictions Weight Bearing Restrictions: Yes LLE Weight Bearing: Partial  weight bearing LLE Partial Weight Bearing Percentage or Pounds: 50       Mobility Bed Mobility Overal bed mobility: Needs Assistance Bed Mobility: Supine to Sit     Supine to sit: Min assist     General bed mobility comments: verbal cues for rail use and scooting hips forward. Bed pad to assist with hips    Transfers Overall transfer level: Needs assistance Equipment used: Rolling walker (2 wheels) Transfers: Sit to/from Stand, Bed to chair/wheelchair/BSC Sit to Stand: Min assist, +2 physical assistance     Step pivot transfers: Min assist, Mod assist, +2 physical assistance     General transfer comment: difficulty weight shifting and cues to use UE to assist with maintain PWB on LLE     Balance Overall balance assessment: Needs assistance Sitting-balance support: Feet unsupported Sitting balance-Leahy Scale: Fair Sitting balance - Comments: right lateral leaning but was able to correct with verbal cues and occasional min assist Postural control: Right lateral lean Standing balance support: Bilateral upper extremity supported Standing balance-Leahy Scale: Poor Standing balance comment: reliant on RW                           ADL either performed or assessed with clinical judgement   ADL Overall ADL's : Needs assistance/impaired                 Upper Body Dressing : Minimal assistance;Sitting Upper Body Dressing Details (indicate cue type and reason): changed gowns seated on EOB Lower Body Dressing: Maximal assistance Lower Body Dressing Details (indicate cue type and reason): to donn shoe on RLE seated on EOB               General ADL Comments: mod assist to donn LLE prosthestic on  EOB    Extremity/Trunk Assessment              Vision       Perception     Praxis      Cognition Arousal/Alertness: Awake/alert Behavior During Therapy: Flat affect Overall Cognitive Status: Impaired/Different from baseline Area of Impairment:  Following commands, Problem solving                       Following Commands: Follows one step commands with increased time     Problem Solving: Slow processing, Requires verbal cues General Comments: able to recall WB precautions, slow processing        Exercises Exercises: General Upper Extremity General Exercises - Upper Extremity Elbow Flexion: Strengthening, Both, 10 reps, Seated, Theraband Theraband Level (Elbow Flexion): Level 2 (Red) Elbow Extension: Strengthening, Both, 10 reps, Seated, Theraband Theraband Level (Elbow Extension): Level 2 (Red)    Shoulder Instructions       General Comments      Pertinent Vitals/ Pain       Pain Assessment Pain Assessment: Faces Faces Pain Scale: Hurts little more Pain Location: LLE with movement Pain Descriptors / Indicators: Grimacing, Operative site guarding, Discomfort Pain Intervention(s): Limited activity within patient's tolerance, Monitored during session, Repositioned  Home Living                                          Prior Functioning/Environment              Frequency  Min 2X/week        Progress Toward Goals  OT Goals(current goals can now be found in the care plan section)  Progress towards OT goals: Progressing toward goals  Acute Rehab OT Goals Patient Stated Goal: get better OT Goal Formulation: With patient Time For Goal Achievement: 11/11/21 Potential to Achieve Goals: Good ADL Goals Pt Will Perform Lower Body Dressing:  (patient will be able to donn his prosthesis sitting on EOB at setup level) Pt Will Transfer to Toilet: ambulating;bedside commode;with supervision (over toilet) Pt Will Perform Toileting - Clothing Manipulation and hygiene: with min assist (sit to stand) Additional ADL Goal #1: Pt will be S in and OOB for basic ADLs  Plan Discharge plan remains appropriate    Co-evaluation    PT/OT/SLP Co-Evaluation/Treatment: Yes Reason for Co-Treatment:  For patient/therapist safety;To address functional/ADL transfers   OT goals addressed during session: ADL's and self-care;Strengthening/ROM      AM-PAC OT "6 Clicks" Daily Activity     Outcome Measure   Help from another person eating meals?: A Little Help from another person taking care of personal grooming?: A Little Help from another person toileting, which includes using toliet, bedpan, or urinal?: A Lot Help from another person bathing (including washing, rinsing, drying)?: A Lot Help from another person to put on and taking off regular upper body clothing?: A Little Help from another person to put on and taking off regular lower body clothing?: Total 6 Click Score: 14    End of Session Equipment Utilized During Treatment: Gait belt;Rolling walker (2 wheels)  OT Visit Diagnosis: Unsteadiness on feet (R26.81);Other abnormalities of gait and mobility (R26.89);Muscle weakness (generalized) (M62.81);Pain Pain - Right/Left: Left Pain - part of body: Leg   Activity Tolerance Patient tolerated treatment well   Patient Left in chair;with call bell/phone within reach   Nurse Communication Mobility  status;Precautions        Time: 4709-2957 OT Time Calculation (min): 27 min  Charges: OT General Charges $OT Visit: 1 Visit OT Treatments $Self Care/Home Management : 8-22 mins  Lodema Hong, [place]  Office [phone]   Trixie Dredge 10/30/2021, 9:23 AM

## 2021-10-30 NOTE — Progress Notes (Signed)
Physical Therapy Treatment Patient Details Name: Billy Torres MRN: [id number] DOB: [birth year] Today's Date: 10/30/2021   History of Present Illness Pt is a 66 y.o. M who presents following a fall with left hip intertrochanteric/subtrochanteric femur fx now s/p ORIF 10/27/2021. Significant PMH: CKD, stroke, L BKA, PAD, prostate CA, DM2, sensorineural hearing loss.    PT Comments    Pt seen by OT and PT to progress OOB mobility, however, pt continues to be limited by weakness and quick fatigue of UE's and RLE when up. Pt performed several sit>stand with min A +2 and initiated gait but pt fatigues after 2' fwd motion. Then worked on pivoting with RW to recliner with needed assist progressing to mod A +2 . Pt agreeable to SNF and therapy continues to recommend post acute rehab. PT will continue to follow.   Recommendations for follow up therapy are one component of a multi-disciplinary discharge planning process, led by the attending physician.  Recommendations may be updated based on patient status, additional functional criteria and insurance authorization.  Follow Up Recommendations  Skilled nursing-short term rehab (<3 hours/day) Can patient physically be transported by private vehicle: Yes   Assistance Recommended at Discharge Frequent or constant Supervision/Assistance  Patient can return home with the following A lot of help with walking and/or transfers;A lot of help with bathing/dressing/bathroom;Assistance with cooking/housework;Assist for transportation;Help with stairs or ramp for entrance   Equipment Recommendations  Rolling walker (2 wheels);BSC/3in1    Recommendations for Other Services Rehab consult;OT consult     Precautions / Restrictions Precautions Precautions: Fall Restrictions Weight Bearing Restrictions: Yes LLE Weight Bearing: Partial weight bearing LLE Partial Weight Bearing Percentage or Pounds: 50     Mobility  Bed Mobility Overal bed mobility: Needs  Assistance Bed Mobility: Supine to Sit     Supine to sit: Mod assist     General bed mobility comments: min A at trunk but mod A at hips to scoot fwd to EOB    Transfers Overall transfer level: Needs assistance Equipment used: Rolling walker (2 wheels) Transfers: Sit to/from Stand, Bed to chair/wheelchair/BSC Sit to Stand: Min assist, +2 physical assistance   Step pivot transfers: Min assist, Mod assist, +2 physical assistance       General transfer comment: difficulty weight shifting and cues to use UE to assist with maintain PWB on LLE. Pt with weakness UE's which makes transfers and gait harder. RLE begins shaking with effort as well    Ambulation/Gait Ambulation/Gait assistance: Min assist, +2 physical assistance Gait Distance (Feet): 2 Feet Assistive device: Rolling walker (2 wheels) Gait Pattern/deviations: Step-to pattern, Decreased weight shift to left, Antalgic Gait velocity: decreased Gait velocity interpretation: <1.31 ft/sec, indicative of household ambulator   General Gait Details: pt with minimal tolerance for stepping and asks to sit down after 2' due to fatigue and trembling RLE and UE's   Stairs             Wheelchair Mobility    Modified Rankin (Stroke Patients Only)       Balance Overall balance assessment: Needs assistance Sitting-balance support: Feet unsupported Sitting balance-Leahy Scale: Fair Sitting balance - Comments: right lateral leaning but was able to correct with verbal cues and occasional min assist Postural control: Right lateral lean Standing balance support: Bilateral upper extremity supported Standing balance-Leahy Scale: Poor Standing balance comment: reliant on RW  Cognition Arousal/Alertness: Awake/alert Behavior During Therapy: Flat affect Overall Cognitive Status: Impaired/Different from baseline Area of Impairment: Following commands, Problem solving                        Following Commands: Follows one step commands with increased time     Problem Solving: Slow processing, Requires verbal cues General Comments: able to recall WB precautions, slow processing        Exercises General Exercises - Lower Extremity Quad Sets: 10 reps, Seated, Both Long Arc Quad: AAROM, 10 reps, Seated, Left Hip Flexion/Marching: 10 reps, Seated, Left, AAROM    General Comments        Pertinent Vitals/Pain Pain Assessment Pain Assessment: Faces Faces Pain Scale: Hurts little more Pain Location: LLE Pain Descriptors / Indicators: Grimacing, Operative site guarding, Discomfort Pain Intervention(s): Limited activity within patient's tolerance, Monitored during session    Home Living                          Prior Function            PT Goals (current goals can now be found in the care plan section) Acute Rehab PT Goals Patient Stated Goal: did not state PT Goal Formulation: With patient Time For Goal Achievement: 11/11/21 Potential to Achieve Goals: Good Progress towards PT goals: Progressing toward goals    Frequency    Min 3X/week      PT Plan Discharge plan needs to be updated;Frequency needs to be updated    Co-evaluation PT/OT/SLP Co-Evaluation/Treatment: Yes Reason for Co-Treatment: Complexity of the patient's impairments (multi-system involvement);For patient/therapist safety PT goals addressed during session: Mobility/safety with mobility;Balance;Proper use of DME;Strengthening/ROM OT goals addressed during session: ADL's and self-care;Strengthening/ROM      AM-PAC PT "6 Clicks" Mobility   Outcome Measure  Help needed turning from your back to your side while in a flat bed without using bedrails?: A Little Help needed moving from lying on your back to sitting on the side of a flat bed without using bedrails?: A Little Help needed moving to and from a bed to a chair (including a wheelchair)?: A Little Help needed  standing up from a chair using your arms (e.g., wheelchair or bedside chair)?: A Little Help needed to walk in hospital room?: Total Help needed climbing 3-5 steps with a railing? : Total 6 Click Score: 14    End of Session Equipment Utilized During Treatment: Gait belt Activity Tolerance: Patient tolerated treatment well Patient left: in chair;with call bell/phone within reach;with chair alarm set Nurse Communication: Mobility status PT Visit Diagnosis: Pain;Difficulty in walking, not elsewhere classified (R26.2) Pain - Right/Left: Left Pain - part of body: Leg     Time: 3559-7416 PT Time Calculation (min) (ACUTE ONLY): 23 min  Charges:  $Gait Training: 8-22 mins                     [place] chat preferred Office [place] 10/30/2021, 11:18 AM

## 2021-10-30 NOTE — Progress Notes (Signed)
Lovena Neighbours Cottier to be D/C'd to [facility] per MD order. Report called to [employer], Therapist, sports at [facility].  Skin clean and dry, surgical dressing clean and intact. IV catheter discontinued intact. Site without signs and symptoms of complications. Dressing and pressure applied.  An After Visit Summary was printed and given to [facility] transport  Patient escorted via [place], and D/C via [facility].  Billy Torres  10/30/2021 4:45 PM

## 2021-10-30 NOTE — Discharge Summary (Signed)
Physician Discharge Summary  KILO ESHELMAN ZOX:[id number] DOB: Jul 08, 1955 DOA: 10/26/2021  PCP: Patient, No Pcp Per  Admit date: 10/26/2021 Discharge date: 10/30/2021  Admitted From: Home Disposition:  SNF  Recommendations for Outpatient Follow-up:  Follow up with PCP in 1-2 weeks Please obtain BMP/CBC in one week   Home Health:No Equipment/Devices:None  Discharge Condition:Stable CODE STATUS:Full Diet recommendation: Heart Healthy  Brief/Interim Summary: 66 y.o. male past medical history of ischemic CVA, diabetes mellitus type 2 with peripheral neuropathy, status post left BKA in April 2021 admitted to [facility] after mechanical fall, hip x-ray showed an acute comminuted intertrochanteric and subtrochanteric fracture of the left femur orthopedic surgery was consulted Dr. Marlou Sa  Discharge Diagnoses:  Principal Problem:   Closed left hip fracture ([place]) Active Problems:   HLD (hyperlipidemia)   Diabetes mellitus due to underlying condition with complication, without long-term current use of insulin ([place])   Leukocytosis   Fall at home, initial encounter   BPH (benign prostatic hyperplasia)   GERD (gastroesophageal reflux disease)   Allergic rhinitis  Acute closed left hip fracture: Orthopedic surgery was consulted and is status post left intertrochanteric medullary nailing on 07/27/2021. Physical therapy with the patient the recommended skilled nursing facility.  Mechanical fall: He relates no loss of consciousness relates he tripped and fell.  Leukocytosis: Likely reactive.  Hyperlipidemia Continue statins.  Poorly controlled diabetes mellitus type 2 due to underlying complication without long-term use of insulin: Metformin was held A1c was 10.6 he was started on long-acting insulin plus sliding scale, His blood glucose has been fairly controlled here in the hospital with long-acting insulin and sliding scale, he will go back to his home regimen of Amaryl  and metformin.  History of CVA: Noted.  BPH: Resume medication as an outpatient.  Sacral decubitus ulcer stage I present on admission: Noted.  Growth  Discharge Instructions  Discharge Instructions     Diet - low sodium heart healthy   Complete by: As directed    Discharge wound care:   Complete by: As directed    Per wound care instructions   Increase activity slowly   Complete by: As directed       Allergies as of 10/30/2021   No Known Allergies      Medication List     TAKE these medications    acetaminophen 500 MG tablet Commonly known as: TYLENOL Take 1,000 mg by mouth 3 (three) times daily as needed (pain).   alfuzosin 10 MG 24 hr tablet Commonly known as: UROXATRAL Take 10 mg by mouth daily with breakfast.   aspirin EC 81 MG tablet Take 81 mg by mouth daily.   atorvastatin 40 MG tablet Commonly known as: LIPITOR Take 40 mg by mouth at bedtime.   donepezil 10 MG tablet Commonly known as: ARICEPT Take 10 mg by mouth at bedtime.   gabapentin 600 MG tablet Commonly known as: NEURONTIN Take 600 mg by mouth in the morning.   gabapentin 800 MG tablet Commonly known as: NEURONTIN Take 800 mg by mouth at bedtime.   lisinopril 20 MG tablet Commonly known as: ZESTRIL Take 20 mg by mouth daily.   loratadine 10 MG tablet Commonly known as: CLARITIN Take 10 mg by mouth daily.   melatonin 5 MG Tabs Take 5 mg by mouth at bedtime.   metFORMIN 500 MG tablet Commonly known as: GLUCOPHAGE Take 500 mg by mouth 2 (two) times daily.   montelukast 5 MG chewable tablet Commonly known as: SINGULAIR Chew  5 mg by mouth at bedtime.   polyethylene glycol 17 g packet Commonly known as: MIRALAX / GLYCOLAX Take 17 g by mouth daily.   Rybelsus 7 MG Tabs Generic drug: Semaglutide Take 7 mg by mouth daily.   Vitamin D3 50 MCG (2000 UT) Tabs Take 2,000 Units by mouth in the morning.               Discharge Care Instructions  (From admission,  onward)           Start     Ordered   10/30/21 0000  Discharge wound care:       Comments: Per wound care instructions   10/30/21 0916            No Known Allergies  Consultations: Orthopedic surgery.   Procedures/Studies: DG FEMUR PORT MIN 2 VIEWS LEFT  Result Date: 10/27/2021 CLINICAL DATA:  Status post left femoral ORIF. EXAM: LEFT FEMUR PORTABLE 2 VIEWS COMPARISON:  Pelvic radiograph dated 10/26/2021. FINDINGS: Status post ORIF of the left femur with intramedullary nail and transcervical screw and cerclage wire. The hardware is intact. The bones are osteopenic. There is no dislocation. Prostate brachytherapy seeds noted. Vascular calcifications. IMPRESSION: Status post ORIF of the left femur. Electronically Signed   By: Anner Crete M.D.   On: 10/27/2021 18:49   DG FEMUR MIN 2 VIEWS LEFT  Result Date: 10/27/2021 CLINICAL DATA:  IM nail femur. EXAM: LEFT FEMUR 2 VIEWS; DG C-ARM 1-60 MIN-NO REPORT COMPARISON:  Plain film of the LEFT femur dated 10/26/2021. FINDINGS: Six intraoperative fluoroscopic images are provided. Images demonstrate placement of intramedullary rod with dynamic hip screws traversing the LEFT femoral neck fracture site. Hardware appears intact and appropriately positioned. Fluoroscopy provided for 119 seconds. Dose: 21.68 mGy IMPRESSION: Intraoperative fluoroscopic images, as detailed above. No evidence of surgical complicating feature. Electronically Signed   By: Franki Cabot M.D.   On: 10/27/2021 17:36   DG C-Arm 1-60 Min-No Report  Result Date: 10/27/2021 Fluoroscopy was utilized by the requesting physician.  No radiographic interpretation.   DG C-Arm 1-60 Min-No Report  Result Date: 10/27/2021 Fluoroscopy was utilized by the requesting physician.  No radiographic interpretation.   DG FEMUR MIN 2 VIEWS LEFT  Result Date: 10/26/2021 CLINICAL DATA:  Left hip pain after a fall. History of below the knee amputation. EXAM: LEFT FEMUR 2 VIEWS;  PELVIS - 1-2 VIEW COMPARISON:  None Available. FINDINGS: Degenerative changes in the lower lumbar spine and hips. Pelvis appears intact. There is an acute comminuted inter trochanteric and subtrochanteric fracture of the proximal left femur with varus angulation of the fracture fragments. Mildly displaced lesser trochanteric fragment. No dislocation at the hip joint. Seed implants are seen in the prostate gland. Vascular calcifications. The midshaft and distal femur are unremarkable. Degenerative changes in the knee joint. IMPRESSION: Acute comminuted inter trochanteric and subtrochanteric fracture of the proximal left femur with varus angulation. Electronically Signed   By: Lucienne Capers M.D.   On: 10/26/2021 22:00   DG Pelvis 1-2 Views  Result Date: 10/26/2021 CLINICAL DATA:  Left hip pain after a fall. History of below the knee amputation. EXAM: LEFT FEMUR 2 VIEWS; PELVIS - 1-2 VIEW COMPARISON:  None Available. FINDINGS: Degenerative changes in the lower lumbar spine and hips. Pelvis appears intact. There is an acute comminuted inter trochanteric and subtrochanteric fracture of the proximal left femur with varus angulation of the fracture fragments. Mildly displaced lesser trochanteric fragment. No dislocation at the hip joint. Seed implants  are seen in the prostate gland. Vascular calcifications. The midshaft and distal femur are unremarkable. Degenerative changes in the knee joint. IMPRESSION: Acute comminuted inter trochanteric and subtrochanteric fracture of the proximal left femur with varus angulation. Electronically Signed   By: Lucienne Capers M.D.   On: 10/26/2021 22:00   (Echo, Carotid, EGD, Colonoscopy, ERCP)    Subjective: No complaints  Discharge Exam: Vitals:   10/30/21 0414 10/30/21 0717  BP: (!) 147/68 (!) 151/76  Pulse: 94 86  Resp: 18   Temp: 97.7 F (36.5 C) 97.8 F (36.6 C)  SpO2: 100% 99%   Vitals:   10/28/21 2236 10/29/21 1933 10/30/21 0414 10/30/21 0717  BP:  138/71 (!) 143/73 (!) 147/68 (!) 151/76  Pulse: 84 100 94 86  Resp: '18 18 18   '$ Temp: 98.5 F (36.9 C) 98.7 F (37.1 C) 97.7 F (36.5 C) 97.8 F (36.6 C)  TempSrc:    Oral  SpO2: 98% 100% 100% 99%  Weight:      Height:        General: Pt is alert, awake, not in acute distress Cardiovascular: RRR, S1/S2 +, no rubs, no gallops Respiratory: CTA bilaterally, no wheezing, no rhonchi Abdominal: Soft, NT, ND, bowel sounds + Extremities: no edema, no cyanosis    The results of significant diagnostics from this hospitalization (including imaging, microbiology, ancillary and laboratory) are listed below for reference.     Microbiology: No results found for this or any previous visit (from the past 240 hour(s)).   Labs: BNP (last 3 results) No results for input(s): "BNP" in the last 8760 hours. Basic Metabolic Panel: Recent Labs  Lab 10/26/21 2200 10/27/21 0527  NA 136 134*  K 4.0 4.2  CL 104 100  CO2 24 24  GLUCOSE 225* 253*  BUN 15 14  CREATININE 1.21 1.19  CALCIUM 8.6* 8.7*  MG  --  1.4*   Liver Function Tests: Recent Labs  Lab 10/27/21 0527  AST 27  ALT 29  ALKPHOS 62  BILITOT 0.8  PROT 6.6  ALBUMIN 3.4*   No results for input(s): "LIPASE", "AMYLASE" in the last 168 hours. No results for input(s): "AMMONIA" in the last 168 hours. CBC: Recent Labs  Lab 10/26/21 1954 10/27/21 0527  WBC 11.8* 13.4*  NEUTROABS 9.6* 11.8*  HGB 13.7 13.0  HCT 41.4 40.3  MCV 86.6 86.9  PLT 222 228   Cardiac Enzymes: No results for input(s): "CKTOTAL", "CKMB", "CKMBINDEX", "TROPONINI" in the last 168 hours. BNP: Invalid input(s): "POCBNP" CBG: Recent Labs  Lab 10/29/21 0757 10/29/21 1243 10/29/21 1627 10/29/21 1933 10/30/21 0718  GLUCAP 141* 277* 174* 257* 130*   D-Dimer No results for input(s): "DDIMER" in the last 72 hours. Hgb A1c No results for input(s): "HGBA1C" in the last 72 hours. Lipid Profile No results for input(s): "CHOL", "HDL", "LDLCALC", "TRIG",  "CHOLHDL", "LDLDIRECT" in the last 72 hours. Thyroid function studies No results for input(s): "TSH", "T4TOTAL", "T3FREE", "THYROIDAB" in the last 72 hours.  Invalid input(s): "FREET3" Anemia work up No results for input(s): "VITAMINB12", "FOLATE", "FERRITIN", "TIBC", "IRON", "RETICCTPCT" in the last 72 hours. Urinalysis    Component Value Date/Time   COLORURINE YELLOW 08/15/2016 1933   APPEARANCEUR CLEAR 08/15/2016 1933   LABSPEC 1.008 08/15/2016 1933   PHURINE 6.0 08/15/2016 1933   GLUCOSEU NEGATIVE 08/15/2016 1933   HGBUR NEGATIVE 08/15/2016 1933   BILIRUBINUR NEGATIVE 08/15/2016 [place] NEGATIVE 08/15/2016 [place] NEGATIVE 08/15/2016 1933   UROBILINOGEN 1 02/28/2014  [place] 08/15/2016 [place] 08/15/2016 Time  Lab 10/26/21 1954 10/27/21 0527  WBC 11.8* 13.4*   Microbiology No results found for this or any previous visit (from the past 240 hour(s)).   SIGNED:   Charlynne Cousins, MD  [facility] 10/30/2021, 9:16 AM Pager   If 7PM-7AM, please contact night-coverage [email or web address] Password [email or web address]

## 2021-11-09 ENCOUNTER — Ambulatory Visit (INDEPENDENT_AMBULATORY_CARE_PROVIDER_SITE_OTHER): Payer: Medicare (Managed Care)

## 2021-11-09 ENCOUNTER — Ambulatory Visit (INDEPENDENT_AMBULATORY_CARE_PROVIDER_SITE_OTHER): Payer: Medicare (Managed Care) | Admitting: Surgical

## 2021-11-09 ENCOUNTER — Encounter: Payer: Self-pay | Admitting: Surgical

## 2021-11-09 DIAGNOSIS — M25552 Pain in left hip: Secondary | ICD-10-CM | POA: Diagnosis not present

## 2021-11-09 NOTE — Progress Notes (Signed)
Post-Op Visit Note   Patient: Billy Torres           Date of Birth: [birth year]           MRN: [id number] Visit Date: 11/09/2021 PCP: Janifer Adie, MD   Assessment & Plan:  Chief Complaint:  Chief Complaint  Patient presents with   Left Leg - Routine Post Op   Visit Diagnoses:  1. Pain of left hip     Plan: Patient is a 66 year old male who presents s/p left femur intramedullary nail with cable cerclage for subtrochanteric fracture on 10/27/2021.  [facility].  Denies any groin pain.  He has been nonweightbearing.  No chest pain or shortness of breath.  No calf pain but he has a BKA with prosthesis.  Mostly complains of lateral sided left hip pain.  No consistent fever.  Aspirin once daily for DVT prophylaxis.  On exam, patient has incisions that are healing well without evidence of infection or dehiscence.  Sutures removed and replaced with Steri-Strips today.  He has intact hip flexion.  No significant pain with internal rotation/external rotation of the left hip joint.  Able to actively extend the left knee.  Plan is okay for full weightbearing with walker.  Radiographs demonstrate hardware that remains in good position with no significant changes compared with prior radiographs.  Subtrochanteric fracture remains in good alignment.  Pain is improving as he gets further and further out from surgery.  Follow-up in 4 weeks for clinical recheck with Dr. Marlou Sa.  No major concerns at this time.  Follow-Up Instructions: No follow-ups on file.   Orders:  Orders Placed This Encounter  Procedures   XR FEMUR MIN 2 VIEWS LEFT   No orders of the defined types were placed in this encounter.   Imaging: No results found.  PMFS History: Patient Active Problem List   Diagnosis Date Noted   Fall at home, initial encounter 10/27/2021   BPH (benign prostatic hyperplasia) 10/27/2021   GERD  (gastroesophageal reflux disease) 10/27/2021   Allergic rhinitis 10/27/2021   Closed left hip fracture ([place]) 10/26/2021   Unintentional weight loss 12/12/2020   Diarrhea 12/12/2020   H. pylori infection 12/12/2020   Cecal polyp 08/15/2019   History of rectal ulcer 08/15/2019   History of anemia 08/15/2019   History of colonic polyps 08/15/2019   Acute blood loss anemia 05/25/2019   Hematochezia 05/25/2019   Aortic atherosclerosis ([place]) 05/25/2019   Pressure injury of skin 05/21/2019   Acquired absence of left leg below knee ([place]) 05/05/2019   Acute osteomyelitis of left foot ([place]) 04/28/2019   Cutaneous abscess of left foot    PVD (peripheral vascular disease) ([place]) 12/11/2018   Hydronephrosis due to obstruction of bladder    Acute urinary retention 08/15/2016   Malignant neoplasm of prostate ([place]) 02/26/2016   Muscle spasticity    Dementia ([place])    Cerebral infarction due to unspecified mechanism    Leukocytosis    Acute thrombotic stroke ([place]) 05/17/2015   Diabetes mellitus due to underlying condition with complication, without long-term current use of insulin (HCC)    Gait disturbance, post-stroke    Hemiparesis (HCC)    Cognitive deficit due to old cerebral infarction    Benign essential HTN    Diabetes mellitus type 2 in nonobese [facility])    Essential hypertension    History of CVA (cerebrovascular accident) without residual deficits  Ataxia, post-stroke    Dysarthria, post-stroke    Tobacco abuse    HLD (hyperlipidemia)    Acute CVA (cerebrovascular accident) ([place]) 05/15/2015   Left-sided weakness 05/14/2015   Right shoulder pain 03/03/2014   History of CVA (cerebrovascular accident) 02/28/2014   Smoking 06/29/2013   CVA (cerebral infarction) 06/29/2013   Dyslipidemia 06/29/2013   TIA (transient ischemic attack) 06/27/2013   Diabetes ([place]) 06/27/2013   Past Medical History:  Diagnosis Date   Abnormal rate of speech    post stroke   Arthritis of left shoulder  region    Chronic constipation    Chronic kidney disease    chronic kidney disease    Cognitive deficit due to old cerebral infarction    DDD (degenerative disc disease), cervical    Degenerative cervical spinal stenosis    Gait disturbance, post-stroke    uses cane/weelchair   History of ischemic left MCA stroke 06/27/2013   w/ evidence hemorrhagia, high grade stenosis bilateral proximal MCA and extensive small matter disease--- symptoms resolved, no residual    History of ischemic stroke without residual deficits 05-14-2015---  residual left hemiparesis and cognitive deficits   small nonhemorrhegic infarct right corona radiata posterior limb and progressive right m2 stenosis   Hyperlipidemia    Hypertension    Insomnia    Internal hemorrhoids    Left hemiparesis (HCC)    RESIDUAL FROM STROKE--  WALKS W/ CANE   Lower GI bleed    Mild atherosclerosis of carotid artery, right    RICA 0-39% per duplex 06-28-2013   Neuromuscular disorder (HCC)    neuropathy - feet   OA (osteoarthritis)    LEFT SHOULDER   Painful bladder spasm    Peripheral arterial disease ([place])    Prostate cancer [facility]) urologist-  dr Gaynelle Arabian  oncologist-  dr [name]   dx 12/ 2017-- Stage T1c, Gleason 3+4,  PSA 10.4,  vol 51cc   Sensorineural hearing loss, bilateral    does not wear hearing aids   Stroke [facility])    Type 2 diabetes mellitus treated with insulin ([place])    Urge incontinence    w frequency, wears depends   Urinary retention    Previously required indwelling Foley   Wears dentures     Family History  Problem Relation Age of Onset   Hypertension Mother    Hypertension Father    Cancer Brother        prostate    Past Surgical History:  Procedure Laterality Date   ABDOMINAL AORTOGRAM W/LOWER EXTREMITY N/A 12/11/2018   Procedure: ABDOMINAL AORTOGRAM W/LOWER EXTREMITY;  Surgeon: Angelia Mould, MD;  Location: [place] [facility];  Service: Cardiovascular;  Laterality: N/A;   ABDOMINAL  AORTOGRAM W/LOWER EXTREMITY Bilateral 04/02/2019   Procedure: ABDOMINAL AORTOGRAM W/LOWER EXTREMITY;  Surgeon: Angelia Mould, MD;  Location: [place] [facility];  Service: Cardiovascular;  Laterality: Bilateral;   AMPUTATION Left 04/28/2019   Procedure: LEFT BELOW KNEE AMPUTATION;  Surgeon: Newt Minion, MD;  Location: [place];  Service: Orthopedics;  Laterality: Left;   ANTERIOR CERVICAL DECOMP/DISCECTOMY FUSION  11/27/1999   C5 -- C6   BELOW KNEE LEG AMPUTATION Left 04/28/2019   BIOPSY  05/21/2019   Procedure: BIOPSY;  Surgeon: Doran Stabler, MD;  Location: [place];  Service: Gastroenterology;;   BIOPSY  12/20/2019   Procedure: BIOPSY;  Surgeon: Irving Copas., MD;  Location: [place];  Service: Gastroenterology;;   BIOPSY  02/19/2021   Procedure: BIOPSY;  Surgeon: Irving Copas., MD;  [facility];  Service: Gastroenterology;;   Denton Meek  10/07/2001   COLONOSCOPY WITH PROPOFOL N/A 05/21/2019   Procedure: COLONOSCOPY WITH PROPOFOL;  Surgeon: Doran Stabler, MD;  Location: [place];  Service: Gastroenterology;  Laterality: N/A;   COLONOSCOPY WITH PROPOFOL N/A 12/20/2019   Procedure: COLONOSCOPY WITH PROPOFOL;  Surgeon: Rush Landmark Telford Nab., MD;  Location: [place];  Service: Gastroenterology;  Laterality: N/A;   COLONOSCOPY WITH PROPOFOL N/A 02/19/2021   Procedure: COLONOSCOPY WITH PROPOFOL;  Surgeon: Rush Landmark Telford Nab., MD;  [facility];  Service: Gastroenterology;  Laterality: N/A;   CYSTOSCOPY WITH INSERTION OF UROLIFT N/A 08/11/2017   Procedure: CYSTOSCOPY WITH INSERTION OF UROLIFT;  Surgeon: Cleon Gustin, MD;  [facility];  Service: Urology;  Laterality: N/A;   ENDOSCOPIC MUCOSAL RESECTION N/A 12/20/2019   Procedure: ENDOSCOPIC MUCOSAL RESECTION;  Surgeon: Rush Landmark Telford Nab., MD;  Location: [place];  Service: Gastroenterology;  Laterality: N/A;   ENDOSCOPIC MUCOSAL RESECTION N/A 02/19/2021    Procedure: ENDOSCOPIC MUCOSAL RESECTION;  Surgeon: Rush Landmark Telford Nab., MD;  [facility];  Service: Gastroenterology;  Laterality: N/A;   ESOPHAGOGASTRODUODENOSCOPY N/A 02/19/2021   Procedure: ESOPHAGOGASTRODUODENOSCOPY (EGD);  Surgeon: Irving Copas., MD;  [facility];  Service: Gastroenterology;  Laterality: N/A;   ESOPHAGOGASTRODUODENOSCOPY (EGD) WITH PROPOFOL N/A 12/20/2019   Procedure: ESOPHAGOGASTRODUODENOSCOPY (EGD) WITH PROPOFOL;  Surgeon: Rush Landmark Telford Nab., MD;  Location: [place];  Service: Gastroenterology;  Laterality: N/A;   FLEXIBLE SIGMOIDOSCOPY N/A 05/23/2019   Procedure: FLEXIBLE SIGMOIDOSCOPY;  Surgeon: Doran Stabler, MD;  Location: [place];  Service: Gastroenterology;  Laterality: N/A;   FLEXIBLE SIGMOIDOSCOPY N/A 05/26/2019   Procedure: FLEXIBLE SIGMOIDOSCOPY;  Surgeon: Milus Banister, MD;  Location: [facility];  Service: Endoscopy;  Laterality: N/A;   FOOT SURGERY  2005   HEMOSTASIS CLIP PLACEMENT  05/23/2019   Procedure: HEMOSTASIS CLIP PLACEMENT;  Surgeon: Doran Stabler, MD;  Location: [place];  Service: Gastroenterology;;   HEMOSTASIS CLIP PLACEMENT  05/26/2019   Procedure: HEMOSTASIS CLIP PLACEMENT;  Surgeon: Milus Banister, MD;  Location: [place];  Service: Endoscopy;;   HEMOSTASIS CLIP PLACEMENT  12/20/2019   Procedure: HEMOSTASIS CLIP PLACEMENT;  Surgeon: Irving Copas., MD;  Location: [place];  Service: Gastroenterology;;   HEMOSTASIS CLIP PLACEMENT  02/19/2021   Procedure: HEMOSTASIS CLIP PLACEMENT;  Surgeon: Irving Copas., MD;  [facility];  Service: Gastroenterology;;   INSERTION OF TRACTION PIN Left 10/27/2021   Procedure: INSERTION AND REMOVAL OF TRACTION PIN;  Surgeon: Meredith Pel, MD;  Location: [place];  Service: Orthopedics;  Laterality: Left;   INTRAMEDULLARY (IM) NAIL INTERTROCHANTERIC Left 10/27/2021   Procedure: INTRAMEDULLARY (IM) NAIL  INTERTROCHANTERIC;  Surgeon: Meredith Pel, MD;  Location: [place];  Service: Orthopedics;  Laterality: Left;   PERIPHERAL VASCULAR BALLOON ANGIOPLASTY  12/11/2018   Procedure: PERIPHERAL VASCULAR BALLOON ANGIOPLASTY;  Surgeon: Angelia Mould, MD;  Location: [place] [facility];  Service: Cardiovascular;;  Lt. SFA   POLYPECTOMY  12/20/2019   Procedure: POLYPECTOMY;  Surgeon: Rush Landmark Telford Nab., MD;  Location: [place];  Service: Gastroenterology;;   POLYPECTOMY  02/19/2021   Procedure: POLYPECTOMY;  Surgeon: Irving Copas., MD;  [facility];  Service: Gastroenterology;;   PROSTATE BIOPSY  12/12/2015   RADIOACTIVE SEED IMPLANT N/A 05/30/2016   Procedure: RADIOACTIVE SEED IMPLANT/BRACHYTHERAPY IMPLANT WITH SPACE OAR;  Surgeon: Carolan Clines, MD;  Location: [facility];  Service: Urology;  Laterality: N/A;   SHOULDER ARTHROSCOPY WITH SUBACROMIAL DECOMPRESSION Left 11/28/2006   Debridement/ Acromioplasty/  CA ligament relesase   SUBMUCOSAL LIFTING INJECTION  12/20/2019   Procedure: SUBMUCOSAL LIFTING INJECTION;  Surgeon: Rush Landmark Telford Nab., MD;  Location: [place];  Service: Gastroenterology;;   SUBMUCOSAL LIFTING INJECTION  02/19/2021   Procedure: SUBMUCOSAL LIFTING INJECTION;  Surgeon: Irving Copas., MD;  [facility];  Service: Gastroenterology;;   TRANSTHORACIC ECHOCARDIOGRAM  05/15/2015   mild LVH,  ef 65-70%, codominant mitral inflow-iindeterminate diastolic function/     Social History   Occupational History   Not on file  Tobacco Use   Smoking status: Former    Packs/day: 0.25    Years: 45.00    Total pack years: 11.25    Types: Cigarettes   Smokeless tobacco: Never  Vaping Use   Vaping Use: Never used  Substance and Sexual Activity   Alcohol use: No   Drug use: No   Sexual activity: Yes

## 2021-11-23 ENCOUNTER — Ambulatory Visit (INDEPENDENT_AMBULATORY_CARE_PROVIDER_SITE_OTHER): Payer: Medicare (Managed Care)

## 2021-11-23 ENCOUNTER — Ambulatory Visit (INDEPENDENT_AMBULATORY_CARE_PROVIDER_SITE_OTHER): Payer: Medicare (Managed Care) | Admitting: Surgical

## 2021-11-23 DIAGNOSIS — M25552 Pain in left hip: Secondary | ICD-10-CM

## 2021-11-24 ENCOUNTER — Encounter: Payer: Self-pay | Admitting: Orthopedic Surgery

## 2021-11-24 NOTE — Progress Notes (Signed)
Post-Op Visit Note   Patient: Billy Torres           Date of Birth: Oct 28, 1955           MRN: [id number] Visit Date: 11/23/2021 PCP: Janifer Adie, MD   Assessment & Plan:  Chief Complaint:  Chief Complaint  Patient presents with   Left Leg - Follow-up   Visit Diagnoses:  1. Pain of left hip     Plan: Patient is a 65 year old male who presents s/p left femoral intramedullary nail with cable cerclage for left femur subtrochanteric fracture on 10/27/2021.  Overall he is doing well.  Not really having any significant amount of pain.  Notes achiness and soreness occasionally in the lateral hip.  No groin pain.  No radiating pain down the leg.  Denies any fevers or chills.  Incisions are well-healed on exam today.  Patient is able to stand without assistance.  He has no significant pain with hip range of motion of the operative leg.  Intact hip flexion though it is weaker compared to contralateral side as expected.  Radiographs of the left femur taken today demonstrate hardware remains in good position with no significant change compared to prior radiographs.  There is still an identifiable fracture line and no callus formation noted yet at this time.  Plan is to continue with mobilization with physical therapy.  Okay for weightbearing as tolerated with a walker.  Follow-up in 4 weeks for clinical recheck with Dr. Marlou Sa and new radiographs at that time.  Follow-Up Instructions: No follow-ups on file.   Orders:  Orders Placed This Encounter  Procedures   XR FEMUR MIN 2 VIEWS LEFT   No orders of the defined types were placed in this encounter.   Imaging: No results found.  PMFS History: Patient Active Problem List   Diagnosis Date Noted   Fall at home, initial encounter 10/27/2021   BPH (benign prostatic hyperplasia) 10/27/2021   GERD (gastroesophageal reflux disease) 10/27/2021   Allergic rhinitis 10/27/2021   Closed left hip fracture ([place]) 10/26/2021   Unintentional  weight loss 12/12/2020   Diarrhea 12/12/2020   H. pylori infection 12/12/2020   Cecal polyp 08/15/2019   History of rectal ulcer 08/15/2019   History of anemia 08/15/2019   History of colonic polyps 08/15/2019   Acute blood loss anemia 05/25/2019   Hematochezia 05/25/2019   Aortic atherosclerosis ([place]) 05/25/2019   Pressure injury of skin 05/21/2019   Acquired absence of left leg below knee ([place]) 05/05/2019   Acute osteomyelitis of left foot ([place]) 04/28/2019   Cutaneous abscess of left foot    PVD (peripheral vascular disease) ([place]) 12/11/2018   Hydronephrosis due to obstruction of bladder    Acute urinary retention 08/15/2016   Malignant neoplasm of prostate ([place]) 02/26/2016   Muscle spasticity    Dementia ([place])    Cerebral infarction due to unspecified mechanism    Leukocytosis    Acute thrombotic stroke ([place]) 05/17/2015   Diabetes mellitus due to underlying condition with complication, without long-term current use of insulin (HCC)    Gait disturbance, post-stroke    Hemiparesis (HCC)    Cognitive deficit due to old cerebral infarction    Benign essential HTN    Diabetes mellitus type 2 in nonobese [facility])    Essential hypertension    History of CVA (cerebrovascular accident) without residual deficits    Ataxia, post-stroke    Dysarthria, post-stroke    Tobacco abuse    HLD (hyperlipidemia)  Acute CVA (cerebrovascular accident) ([place]) 05/15/2015   Left-sided weakness 05/14/2015   Right shoulder pain 03/03/2014   History of CVA (cerebrovascular accident) 02/28/2014   Smoking 06/29/2013   CVA (cerebral infarction) 06/29/2013   Dyslipidemia 06/29/2013   TIA (transient ischemic attack) 06/27/2013   Diabetes ([place]) 06/27/2013   Past Medical History:  Diagnosis Date   Abnormal rate of speech    post stroke   Arthritis of left shoulder region    Chronic constipation    Chronic kidney disease    chronic kidney disease    Cognitive deficit due to old cerebral infarction     DDD (degenerative disc disease), cervical    Degenerative cervical spinal stenosis    Gait disturbance, post-stroke    uses cane/weelchair   History of ischemic left MCA stroke 06/27/2013   w/ evidence hemorrhagia, high grade stenosis bilateral proximal MCA and extensive small matter disease--- symptoms resolved, no residual    History of ischemic stroke without residual deficits 05-14-2015---  residual left hemiparesis and cognitive deficits   small nonhemorrhegic infarct right corona radiata posterior limb and progressive right m2 stenosis   Hyperlipidemia    Hypertension    Insomnia    Internal hemorrhoids    Left hemiparesis (HCC)    RESIDUAL FROM STROKE--  WALKS W/ CANE   Lower GI bleed    Mild atherosclerosis of carotid artery, right    RICA 0-39% per duplex 06-28-2013   Neuromuscular disorder (HCC)    neuropathy - feet   OA (osteoarthritis)    LEFT SHOULDER   Painful bladder spasm    Peripheral arterial disease ([place])    Prostate cancer [facility]) urologist-  dr Gaynelle Arabian  oncologist-  dr [name]   dx 12/ 2017-- Stage T1c, Gleason 3+4,  PSA 10.4,  vol 51cc   Sensorineural hearing loss, bilateral    does not wear hearing aids   Stroke [facility])    Type 2 diabetes mellitus treated with insulin ([place])    Urge incontinence    w frequency, wears depends   Urinary retention    Previously required indwelling Foley   Wears dentures     Family History  Problem Relation Age of Onset   Hypertension Mother    Hypertension Father    Cancer Brother        prostate    Past Surgical History:  Procedure Laterality Date   ABDOMINAL AORTOGRAM W/LOWER EXTREMITY N/A 12/11/2018   Procedure: ABDOMINAL AORTOGRAM W/LOWER EXTREMITY;  Surgeon: Angelia Mould, MD;  Location: [place] [facility];  Service: Cardiovascular;  Laterality: N/A;   ABDOMINAL AORTOGRAM W/LOWER EXTREMITY Bilateral 04/02/2019   Procedure: ABDOMINAL AORTOGRAM W/LOWER EXTREMITY;  Surgeon: Angelia Mould, MD;   Location: [place] [facility];  Service: Cardiovascular;  Laterality: Bilateral;   AMPUTATION Left 04/28/2019   Procedure: LEFT BELOW KNEE AMPUTATION;  Surgeon: Newt Minion, MD;  Location: [place];  Service: Orthopedics;  Laterality: Left;   ANTERIOR CERVICAL DECOMP/DISCECTOMY FUSION  11/27/1999   C5 -- C6   BELOW KNEE LEG AMPUTATION Left 04/28/2019   BIOPSY  05/21/2019   Procedure: BIOPSY;  Surgeon: Doran Stabler, MD;  Location: [place];  Service: Gastroenterology;;   BIOPSY  12/20/2019   Procedure: BIOPSY;  Surgeon: Irving Copas., MD;  Location: [place];  Service: Gastroenterology;;   BIOPSY  02/19/2021   Procedure: BIOPSY;  Surgeon: Irving Copas., MD;  [facility];  Service: Gastroenterology;;   Denton Meek  10/07/2001   COLONOSCOPY  WITH PROPOFOL N/A 05/21/2019   Procedure: COLONOSCOPY WITH PROPOFOL;  Surgeon: Doran Stabler, MD;  Location: [place];  Service: Gastroenterology;  Laterality: N/A;   COLONOSCOPY WITH PROPOFOL N/A 12/20/2019   Procedure: COLONOSCOPY WITH PROPOFOL;  Surgeon: Rush Landmark Telford Nab., MD;  Location: [place];  Service: Gastroenterology;  Laterality: N/A;   COLONOSCOPY WITH PROPOFOL N/A 02/19/2021   Procedure: COLONOSCOPY WITH PROPOFOL;  Surgeon: Rush Landmark Telford Nab., MD;  [facility];  Service: Gastroenterology;  Laterality: N/A;   CYSTOSCOPY WITH INSERTION OF UROLIFT N/A 08/11/2017   Procedure: CYSTOSCOPY WITH INSERTION OF UROLIFT;  Surgeon: Cleon Gustin, MD;  [facility];  Service: Urology;  Laterality: N/A;   ENDOSCOPIC MUCOSAL RESECTION N/A 12/20/2019   Procedure: ENDOSCOPIC MUCOSAL RESECTION;  Surgeon: Rush Landmark Telford Nab., MD;  Location: [place];  Service: Gastroenterology;  Laterality: N/A;   ENDOSCOPIC MUCOSAL RESECTION N/A 02/19/2021   Procedure: ENDOSCOPIC MUCOSAL RESECTION;  Surgeon: Rush Landmark Telford Nab., MD;  [facility];  Service: Gastroenterology;   Laterality: N/A;   ESOPHAGOGASTRODUODENOSCOPY N/A 02/19/2021   Procedure: ESOPHAGOGASTRODUODENOSCOPY (EGD);  Surgeon: Irving Copas., MD;  [facility];  Service: Gastroenterology;  Laterality: N/A;   ESOPHAGOGASTRODUODENOSCOPY (EGD) WITH PROPOFOL N/A 12/20/2019   Procedure: ESOPHAGOGASTRODUODENOSCOPY (EGD) WITH PROPOFOL;  Surgeon: Rush Landmark Telford Nab., MD;  Location: [place];  Service: Gastroenterology;  Laterality: N/A;   FLEXIBLE SIGMOIDOSCOPY N/A 05/23/2019   Procedure: FLEXIBLE SIGMOIDOSCOPY;  Surgeon: Doran Stabler, MD;  Location: [place];  Service: Gastroenterology;  Laterality: N/A;   FLEXIBLE SIGMOIDOSCOPY N/A 05/26/2019   Procedure: FLEXIBLE SIGMOIDOSCOPY;  Surgeon: Milus Banister, MD;  Location: [facility];  Service: Endoscopy;  Laterality: N/A;   FOOT SURGERY  2005   HEMOSTASIS CLIP PLACEMENT  05/23/2019   Procedure: HEMOSTASIS CLIP PLACEMENT;  Surgeon: Doran Stabler, MD;  Location: [place];  Service: Gastroenterology;;   HEMOSTASIS CLIP PLACEMENT  05/26/2019   Procedure: HEMOSTASIS CLIP PLACEMENT;  Surgeon: Milus Banister, MD;  Location: [place];  Service: Endoscopy;;   HEMOSTASIS CLIP PLACEMENT  12/20/2019   Procedure: HEMOSTASIS CLIP PLACEMENT;  Surgeon: Irving Copas., MD;  Location: [place];  Service: Gastroenterology;;   HEMOSTASIS CLIP PLACEMENT  02/19/2021   Procedure: HEMOSTASIS CLIP PLACEMENT;  Surgeon: Irving Copas., MD;  [facility];  Service: Gastroenterology;;   INSERTION OF TRACTION PIN Left 10/27/2021   Procedure: INSERTION AND REMOVAL OF TRACTION PIN;  Surgeon: Meredith Pel, MD;  Location: [place];  Service: Orthopedics;  Laterality: Left;   INTRAMEDULLARY (IM) NAIL INTERTROCHANTERIC Left 10/27/2021   Procedure: INTRAMEDULLARY (IM) NAIL INTERTROCHANTERIC;  Surgeon: Meredith Pel, MD;  Location: [place];  Service: Orthopedics;  Laterality: Left;   PERIPHERAL VASCULAR  BALLOON ANGIOPLASTY  12/11/2018   Procedure: PERIPHERAL VASCULAR BALLOON ANGIOPLASTY;  Surgeon: Angelia Mould, MD;  Location: [place] [facility];  Service: Cardiovascular;;  Lt. SFA   POLYPECTOMY  12/20/2019   Procedure: POLYPECTOMY;  Surgeon: Rush Landmark Telford Nab., MD;  Location: [place];  Service: Gastroenterology;;   POLYPECTOMY  02/19/2021   Procedure: POLYPECTOMY;  Surgeon: Irving Copas., MD;  [facility];  Service: Gastroenterology;;   PROSTATE BIOPSY  12/12/2015   RADIOACTIVE SEED IMPLANT N/A 05/30/2016   Procedure: RADIOACTIVE SEED IMPLANT/BRACHYTHERAPY IMPLANT WITH SPACE OAR;  Surgeon: Carolan Clines, MD;  Location: [facility];  Service: Urology;  Laterality: N/A;   SHOULDER ARTHROSCOPY WITH SUBACROMIAL DECOMPRESSION Left 11/28/2006   Debridement/ Acromioplasty/  CA ligament relesase  SUBMUCOSAL LIFTING INJECTION  12/20/2019   Procedure: SUBMUCOSAL LIFTING INJECTION;  Surgeon: Rush Landmark Telford Nab., MD;  Location: [place];  Service: Gastroenterology;;   SUBMUCOSAL LIFTING INJECTION  02/19/2021   Procedure: SUBMUCOSAL LIFTING INJECTION;  Surgeon: Irving Copas., MD;  [facility];  Service: Gastroenterology;;   TRANSTHORACIC ECHOCARDIOGRAM  05/15/2015   mild LVH,  ef 65-70%, codominant mitral inflow-iindeterminate diastolic function/     Social History   Occupational History   Not on file  Tobacco Use   Smoking status: Former    Packs/day: 0.25    Years: 45.00    Total pack years: 11.25    Types: Cigarettes   Smokeless tobacco: Never  Vaping Use   Vaping Use: Never used  Substance and Sexual Activity   Alcohol use: No   Drug use: No   Sexual activity: Yes

## 2021-12-07 ENCOUNTER — Ambulatory Visit: Payer: Medicare (Managed Care) | Admitting: Gastroenterology

## 2021-12-09 ENCOUNTER — Other Ambulatory Visit: Payer: Self-pay

## 2021-12-09 ENCOUNTER — Emergency Department (HOSPITAL_COMMUNITY)
Admission: EM | Admit: 2021-12-09 | Discharge: 2021-12-10 | Disposition: A | Discharge status: Home / Self Care | Payer: Medicare (Managed Care) | Attending: Emergency Medicine | Admitting: Emergency Medicine

## 2021-12-09 ENCOUNTER — Encounter (HOSPITAL_COMMUNITY): Payer: Self-pay | Admitting: *Deleted

## 2021-12-09 DIAGNOSIS — Z7982 Long term (current) use of aspirin: Secondary | ICD-10-CM | POA: Insufficient documentation

## 2021-12-09 DIAGNOSIS — R112 Nausea with vomiting, unspecified: Secondary | ICD-10-CM | POA: Diagnosis not present

## 2021-12-09 DIAGNOSIS — E1122 Type 2 diabetes mellitus with diabetic chronic kidney disease: Secondary | ICD-10-CM | POA: Diagnosis not present

## 2021-12-09 DIAGNOSIS — N189 Chronic kidney disease, unspecified: Secondary | ICD-10-CM | POA: Diagnosis not present

## 2021-12-09 DIAGNOSIS — Z7984 Long term (current) use of oral hypoglycemic drugs: Secondary | ICD-10-CM | POA: Diagnosis not present

## 2021-12-09 DIAGNOSIS — I129 Hypertensive chronic kidney disease with stage 1 through stage 4 chronic kidney disease, or unspecified chronic kidney disease: Secondary | ICD-10-CM | POA: Insufficient documentation

## 2021-12-09 DIAGNOSIS — Z79899 Other long term (current) drug therapy: Secondary | ICD-10-CM | POA: Insufficient documentation

## 2021-12-09 DIAGNOSIS — Z8546 Personal history of malignant neoplasm of prostate: Secondary | ICD-10-CM | POA: Diagnosis not present

## 2021-12-09 DIAGNOSIS — Z87891 Personal history of nicotine dependence: Secondary | ICD-10-CM | POA: Insufficient documentation

## 2021-12-09 DIAGNOSIS — Z1152 Encounter for screening for COVID-19: Secondary | ICD-10-CM | POA: Insufficient documentation

## 2021-12-09 DIAGNOSIS — Z8673 Personal history of transient ischemic attack (TIA), and cerebral infarction without residual deficits: Secondary | ICD-10-CM | POA: Insufficient documentation

## 2021-12-09 DIAGNOSIS — R197 Diarrhea, unspecified: Secondary | ICD-10-CM | POA: Diagnosis not present

## 2021-12-09 LAB — CBC WITH DIFFERENTIAL/PLATELET
Abs Immature Granulocytes: 0.02 10*3/uL (ref 0.00–0.07)
Basophils Absolute: 0.1 10*3/uL (ref 0.0–0.1)
Basophils Relative: 1 %
Eosinophils Absolute: 0.1 10*3/uL (ref 0.0–0.5)
Eosinophils Relative: 1 %
HCT: 37 % — ABNORMAL LOW (ref 39.0–52.0)
Hemoglobin: 11.9 g/dL — ABNORMAL LOW (ref 13.0–17.0)
Immature Granulocytes: 0 %
Lymphocytes Relative: 19 %
Lymphs Abs: 1.8 10*3/uL (ref 0.7–4.0)
MCH: 28.5 pg (ref 26.0–34.0)
MCHC: 32.2 g/dL (ref 30.0–36.0)
MCV: 88.7 fL (ref 80.0–100.0)
Monocytes Absolute: 0.8 10*3/uL (ref 0.1–1.0)
Monocytes Relative: 8 %
Neutro Abs: 6.6 10*3/uL (ref 1.7–7.7)
Neutrophils Relative %: 71 %
Platelets: 309 10*3/uL (ref 150–400)
RBC: 4.17 MIL/uL — ABNORMAL LOW (ref 4.22–5.81)
RDW: 12.3 % (ref 11.5–15.5)
WBC: 9.3 10*3/uL (ref 4.0–10.5)
nRBC: 0 % (ref 0.0–0.2)

## 2021-12-09 LAB — COMPREHENSIVE METABOLIC PANEL
ALT: 10 U/L (ref 0–44)
AST: 17 U/L (ref 15–41)
Albumin: 3.4 g/dL — ABNORMAL LOW (ref 3.5–5.0)
Alkaline Phosphatase: 87 U/L (ref 38–126)
Anion gap: 10 (ref 5–15)
BUN: 10 mg/dL (ref 8–23)
CO2: 24 mmol/L (ref 22–32)
Calcium: 9 mg/dL (ref 8.9–10.3)
Chloride: 104 mmol/L (ref 98–111)
Creatinine, Ser: 1.04 mg/dL (ref 0.61–1.24)
GFR, Estimated: >60 mL/min (ref >60)
Glucose, Bld: 133 mg/dL — ABNORMAL HIGH (ref 70–99)
Potassium: 3.8 mmol/L (ref 3.5–5.1)
Sodium: 138 mmol/L (ref 135–145)
Total Bilirubin: 0.6 mg/dL (ref 0.3–1.2)
Total Protein: 7 g/dL (ref 6.5–8.1)

## 2021-12-09 LAB — LIPASE, BLOOD: Lipase: 38 U/L (ref 11–51)

## 2021-12-09 NOTE — ED Provider Triage Note (Signed)
Emergency Medicine Provider Triage Evaluation Note  Billy Torres , a 66 y.o. male  was evaluated in triage.  He has a history of stroke and chronic kidney disease.  Pt complains of vomiting and some diarrhea over the past 4 days.  He was transported by EMS.  He denies chest or abdominal pain.  He is a poor historian not giving many details in regards to recent history.    Review of Systems  Positive: Vomiting and diarrhea Negative: Pain or abdominal pain  Physical Exam  BP (!) 142/80   Pulse 76   Temp 99 F (37.2 C) (Oral)   Resp 18   Ht '5\' 8"'$  (1.727 m)   Wt 70.3 kg   SpO2 100%   BMI 23.57 kg/m  Gen:   Awake, no distress   Resp:  Normal effort  MSK:   Moves extremities without difficulty  Other:  Abdomen is soft and nontender  Medical Decision Making  Medically screening exam initiated at 4:21 PM.  Appropriate orders placed.  Billy Torres was informed that the remainder of the evaluation will be completed by another provider, this initial triage assessment does not replace that evaluation, and the importance of remaining in the ED until their evaluation is complete.     Carlisle Cater, PA-C 12/09/21 1623

## 2021-12-09 NOTE — ED Triage Notes (Signed)
He arrived by [facility] from home diarrhea for 4 days  vomiting today  no other complaints

## 2021-12-10 ENCOUNTER — Other Ambulatory Visit: Payer: Self-pay | Admitting: Family Medicine

## 2021-12-10 DIAGNOSIS — N401 Enlarged prostate with lower urinary tract symptoms: Secondary | ICD-10-CM

## 2021-12-10 LAB — RESP PANEL BY RT-PCR (FLU A&B, COVID) ARPGX2
Influenza A by PCR: NEGATIVE
Influenza B by PCR: NEGATIVE
SARS Coronavirus 2 by RT PCR: NEGATIVE

## 2021-12-10 NOTE — ED Notes (Signed)
Patient was called 3 times no answer.

## 2021-12-10 NOTE — ED Provider Notes (Signed)
[facility] EMERGENCY DEPARTMENT Provider Note  CSN: [id number] Arrival date & time: 12/09/21 1531  Chief Complaint(s) Diarrhea  HPI Billy Torres is a 66 y.o. male with history of prior stroke, hyperlipidemia, hypertension, diabetes presenting with vomiting.  Patient reports that he called EMS yesterday because he had been vomiting for 3 days.  He reports that subsequently while waiting in the waiting room his nausea and vomiting have resolved.  Patient also reports intermittent mild diarrhea without blood.  He denies ever having abdominal pain.  He reports he had some runny nose, no sore throat, cough, fevers or chills, headaches, blood in his stool, hematemesis.  Symptoms mild.  Recently had hip surgery, reports that he has been well since then.   Past Medical History Past Medical History:  Diagnosis Date   Abnormal rate of speech    post stroke   Arthritis of left shoulder region    Chronic constipation    Chronic kidney disease    chronic kidney disease    Cognitive deficit due to old cerebral infarction    DDD (degenerative disc disease), cervical    Degenerative cervical spinal stenosis    Gait disturbance, post-stroke    uses cane/weelchair   History of ischemic left MCA stroke 06/27/2013   w/ evidence hemorrhagia, high grade stenosis bilateral proximal MCA and extensive small matter disease--- symptoms resolved, no residual    History of ischemic stroke without residual deficits 05-14-2015---  residual left hemiparesis and cognitive deficits   small nonhemorrhegic infarct right corona radiata posterior limb and progressive right m2 stenosis   Hyperlipidemia    Hypertension    Insomnia    Internal hemorrhoids    Left hemiparesis (HCC)    RESIDUAL FROM STROKE--  WALKS W/ CANE   Lower GI bleed    Mild atherosclerosis of carotid artery, right    RICA 0-39% per duplex 06-28-2013   Neuromuscular disorder (HCC)    neuropathy - feet   OA  (osteoarthritis)    LEFT SHOULDER   Painful bladder spasm    Peripheral arterial disease ([place])    Prostate cancer [facility]) urologist-  dr Gaynelle Arabian  oncologist-  dr [name]   dx 12/ 2017-- Stage T1c, Gleason 3+4,  PSA 10.4,  vol 51cc   Sensorineural hearing loss, bilateral    does not wear hearing aids   Stroke [facility])    Type 2 diabetes mellitus treated with insulin ([place])    Urge incontinence    w frequency, wears depends   Urinary retention    Previously required indwelling Foley   Wears dentures    Patient Active Problem List   Diagnosis Date Noted   Fall at home, initial encounter 10/27/2021   BPH (benign prostatic hyperplasia) 10/27/2021   GERD (gastroesophageal reflux disease) 10/27/2021   Allergic rhinitis 10/27/2021   Closed left hip fracture ([place]) 10/26/2021   Unintentional weight loss 12/12/2020   Diarrhea 12/12/2020   H. pylori infection 12/12/2020   Cecal polyp 08/15/2019   History of rectal ulcer 08/15/2019   History of anemia 08/15/2019   History of colonic polyps 08/15/2019   Acute blood loss anemia 05/25/2019   Hematochezia 05/25/2019   Aortic atherosclerosis ([place]) 05/25/2019   Pressure injury of skin 05/21/2019   Acquired absence of left leg below knee ([place]) 05/05/2019   Acute osteomyelitis of left foot ([place]) 04/28/2019   Cutaneous abscess of left foot    PVD (peripheral vascular disease) ([place]) 12/11/2018   Hydronephrosis due to obstruction of  bladder    Acute urinary retention 08/15/2016   Malignant neoplasm of prostate ([place]) 02/26/2016   Muscle spasticity    Dementia (HCC)    Cerebral infarction due to unspecified mechanism    Leukocytosis    Acute thrombotic stroke ([place]) 05/17/2015   Diabetes mellitus due to underlying condition with complication, without long-term current use of insulin (HCC)    Gait disturbance, post-stroke    Hemiparesis (HCC)    Cognitive deficit due to old cerebral infarction    Benign essential HTN    Diabetes mellitus type 2  in nonobese [facility])    Essential hypertension    History of CVA (cerebrovascular accident) without residual deficits    Ataxia, post-stroke    Dysarthria, post-stroke    Tobacco abuse    HLD (hyperlipidemia)    Acute CVA (cerebrovascular accident) ([place]) 05/15/2015   Left-sided weakness 05/14/2015   Right shoulder pain 03/03/2014   History of CVA (cerebrovascular accident) 02/28/2014   Smoking 06/29/2013   CVA (cerebral infarction) 06/29/2013   Dyslipidemia 06/29/2013   TIA (transient ischemic attack) 06/27/2013   Diabetes ([place]) 06/27/2013   Home Medication(s) Prior to Admission medications   Medication Sig Start Date End Date Taking? Authorizing Provider  acetaminophen (TYLENOL) 500 MG tablet Take 1,000 mg by mouth 3 (three) times daily as needed (pain).    [provider]  alfuzosin (UROXATRAL) 10 MG 24 hr tablet Take 10 mg by mouth daily with breakfast.    [provider]  aspirin EC 81 MG tablet Take 81 mg by mouth daily.    [provider]  atorvastatin (LIPITOR) 40 MG tablet Take 40 mg by mouth at bedtime.    [provider]  Cholecalciferol (VITAMIN D3) 50 MCG (2000 UT) TABS Take 2,000 Units by mouth in the morning.    [provider]  donepezil (ARICEPT) 10 MG tablet Take 10 mg by mouth at bedtime.    [provider]  gabapentin (NEURONTIN) 600 MG tablet Take 600 mg by mouth in the morning.    [provider]  gabapentin (NEURONTIN) 800 MG tablet Take 800 mg by mouth at bedtime.    [provider]  lisinopril (ZESTRIL) 20 MG tablet Take 20 mg by mouth daily.    [provider]  loratadine (CLARITIN) 10 MG tablet Take 10 mg by mouth daily.    [provider]  melatonin 5 MG TABS Take 5 mg by mouth at bedtime.    [provider]  metFORMIN (GLUCOPHAGE) 500 MG tablet Take 500 mg by mouth 2 (two) times daily.    [provider]  montelukast (SINGULAIR) 5 MG chewable tablet Chew  5 mg by mouth at bedtime.    [provider]  polyethylene glycol (MIRALAX / GLYCOLAX) 17 g packet Take 17 g by mouth daily. 02/19/21   Mansouraty, Telford Nab., MD  Semaglutide (RYBELSUS) 7 MG TABS Take 7 mg by mouth daily.    [provider]  Past Surgical History Past Surgical History:  Procedure Laterality Date   ABDOMINAL AORTOGRAM W/LOWER EXTREMITY N/A 12/11/2018   Procedure: ABDOMINAL AORTOGRAM W/LOWER EXTREMITY;  Surgeon: Angelia Mould, MD;  Location: [place] CV LAB;  Service: Cardiovascular;  Laterality: N/A;   ABDOMINAL AORTOGRAM W/LOWER EXTREMITY Bilateral 04/02/2019   Procedure: ABDOMINAL AORTOGRAM W/LOWER EXTREMITY;  Surgeon: Angelia Mould, MD;  Location: [place] CV LAB;  Service: Cardiovascular;  Laterality: Bilateral;   AMPUTATION Left 04/28/2019   Procedure: LEFT BELOW KNEE AMPUTATION;  Surgeon: Newt Minion, MD;  Location: [place];  Service: Orthopedics;  Laterality: Left;   ANTERIOR CERVICAL DECOMP/DISCECTOMY FUSION  11/27/1999   C5 -- C6   BELOW KNEE LEG AMPUTATION Left 04/28/2019   BIOPSY  05/21/2019   Procedure: BIOPSY;  Surgeon: Doran Stabler, MD;  Location: [place];  Service: Gastroenterology;;   BIOPSY  12/20/2019   Procedure: BIOPSY;  Surgeon: Irving Copas., MD;  Location: [place];  Service: Gastroenterology;;   BIOPSY  02/19/2021   Procedure: BIOPSY;  Surgeon: Irving Copas., MD;  [facility];  Service: Gastroenterology;;   CIRCUMCISION  10/07/2001   COLONOSCOPY WITH PROPOFOL N/A 05/21/2019   Procedure: COLONOSCOPY WITH PROPOFOL;  Surgeon: Doran Stabler, MD;  Location: [place];  Service: Gastroenterology;  Laterality: N/A;   COLONOSCOPY WITH PROPOFOL N/A 12/20/2019   Procedure: COLONOSCOPY WITH PROPOFOL;  Surgeon: Rush Landmark Telford Nab., MD;   Location: [place];  Service: Gastroenterology;  Laterality: N/A;   COLONOSCOPY WITH PROPOFOL N/A 02/19/2021   Procedure: COLONOSCOPY WITH PROPOFOL;  Surgeon: Rush Landmark Telford Nab., MD;  [facility];  Service: Gastroenterology;  Laterality: N/A;   CYSTOSCOPY WITH INSERTION OF UROLIFT N/A 08/11/2017   Procedure: CYSTOSCOPY WITH INSERTION OF UROLIFT;  Surgeon: Cleon Gustin, MD;  [facility];  Service: Urology;  Laterality: N/A;   ENDOSCOPIC MUCOSAL RESECTION N/A 12/20/2019   Procedure: ENDOSCOPIC MUCOSAL RESECTION;  Surgeon: Rush Landmark Telford Nab., MD;  Location: [place];  Service: Gastroenterology;  Laterality: N/A;   ENDOSCOPIC MUCOSAL RESECTION N/A 02/19/2021   Procedure: ENDOSCOPIC MUCOSAL RESECTION;  Surgeon: Rush Landmark Telford Nab., MD;  [facility];  Service: Gastroenterology;  Laterality: N/A;   ESOPHAGOGASTRODUODENOSCOPY N/A 02/19/2021   Procedure: ESOPHAGOGASTRODUODENOSCOPY (EGD);  Surgeon: Irving Copas., MD;  Location[facility];  Service: Gastroenterology;  Laterality: N/A;   ESOPHAGOGASTRODUODENOSCOPY (EGD) WITH PROPOFOL N/A 12/20/2019   Procedure: ESOPHAGOGASTRODUODENOSCOPY (EGD) WITH PROPOFOL;  Surgeon: Rush Landmark Telford Nab., MD;  Location: [place];  Service: Gastroenterology;  Laterality: N/A;   FLEXIBLE SIGMOIDOSCOPY N/A 05/23/2019   Procedure: FLEXIBLE SIGMOIDOSCOPY;  Surgeon: Doran Stabler, MD;  Location: [place];  Service: Gastroenterology;  Laterality: N/A;   FLEXIBLE SIGMOIDOSCOPY N/A 05/26/2019   Procedure: FLEXIBLE SIGMOIDOSCOPY;  Surgeon: Milus Banister, MD;  Location: [facility];  Service: Endoscopy;  Laterality: N/A;   FOOT SURGERY  2005   HEMOSTASIS CLIP PLACEMENT  05/23/2019   Procedure: HEMOSTASIS CLIP PLACEMENT;  Surgeon: Doran Stabler, MD;  Location: [place];  Service: Gastroenterology;;   HEMOSTASIS CLIP PLACEMENT  05/26/2019   Procedure: HEMOSTASIS CLIP PLACEMENT;  Surgeon: Milus Banister, MD;  Location: [place];  Service: Endoscopy;;   HEMOSTASIS CLIP PLACEMENT  12/20/2019   Procedure: HEMOSTASIS CLIP PLACEMENT;  Surgeon: Irving Copas., MD;  Location: [place];  Service: Gastroenterology;;   HEMOSTASIS CLIP PLACEMENT  02/19/2021   Procedure: HEMOSTASIS CLIP PLACEMENT;  Surgeon: Irving Copas., MD;  [facility];  Service:  Gastroenterology;;   INSERTION OF TRACTION PIN Left 10/27/2021   Procedure: INSERTION AND REMOVAL OF TRACTION PIN;  Surgeon: Meredith Pel, MD;  Location: [place];  Service: Orthopedics;  Laterality: Left;   INTRAMEDULLARY (IM) NAIL INTERTROCHANTERIC Left 10/27/2021   Procedure: INTRAMEDULLARY (IM) NAIL INTERTROCHANTERIC;  Surgeon: Meredith Pel, MD;  Location: [place];  Service: Orthopedics;  Laterality: Left;   PERIPHERAL VASCULAR BALLOON ANGIOPLASTY  12/11/2018   Procedure: PERIPHERAL VASCULAR BALLOON ANGIOPLASTY;  Surgeon: Angelia Mould, MD;  Location: [place] CV LAB;  Service: Cardiovascular;;  Lt. SFA   POLYPECTOMY  12/20/2019   Procedure: POLYPECTOMY;  Surgeon: Rush Landmark Telford Nab., MD;  Location: [place];  Service: Gastroenterology;;   POLYPECTOMY  02/19/2021   Procedure: POLYPECTOMY;  Surgeon: Irving Copas., MD;  [facility];  Service: Gastroenterology;;   PROSTATE BIOPSY  12/12/2015   RADIOACTIVE SEED IMPLANT N/A 05/30/2016   Procedure: RADIOACTIVE SEED IMPLANT/BRACHYTHERAPY IMPLANT WITH SPACE OAR;  Surgeon: Carolan Clines, MD;  Location[facility];  Service: Urology;  Laterality: N/A;   SHOULDER ARTHROSCOPY WITH SUBACROMIAL DECOMPRESSION Left 11/28/2006   Debridement/ Acromioplasty/  CA ligament relesase   SUBMUCOSAL LIFTING INJECTION  12/20/2019   Procedure: SUBMUCOSAL LIFTING INJECTION;  Surgeon: Irving Copas., MD;  Location: [place];  Service: Gastroenterology;;   SUBMUCOSAL LIFTING INJECTION  02/19/2021   Procedure:  SUBMUCOSAL LIFTING INJECTION;  Surgeon: Irving Copas., MD;  [facility];  Service: Gastroenterology;;   TRANSTHORACIC ECHOCARDIOGRAM  05/15/2015   mild LVH,  ef 65-70%, codominant mitral inflow-iindeterminate diastolic function/     Family History Family History  Problem Relation Age of Onset   Hypertension Mother    Hypertension Father    Cancer Brother        prostate    Social History Social History   Tobacco Use   Smoking status: Former    Packs/day: 0.25    Years: 45.00    Total pack years: 11.25    Types: Cigarettes   Smokeless tobacco: Never  Vaping Use   Vaping Use: Never used  Substance Use Topics   Alcohol use: No   Drug use: No   Allergies Patient has no known allergies.  Review of Systems Review of Systems  All other systems reviewed and are negative.   Physical Exam Vital Signs  I have reviewed the triage vital signs BP (!) 155/80   Pulse 82   Temp 98.5 F (36.9 C)   Resp 17   Ht '5\' 8"'$  (1.727 m)   Wt 70.3 kg   SpO2 99%   BMI 23.57 kg/m  Physical Exam Vitals and nursing note reviewed.  Constitutional:      General: He is not in acute distress.    Appearance: Normal appearance.  HENT:     Mouth/Throat:     Mouth: Mucous membranes are moist.  Eyes:     Conjunctiva/sclera: Conjunctivae normal.  Cardiovascular:     Rate and Rhythm: Normal rate and regular rhythm.  Pulmonary:     Effort: Pulmonary effort is normal. No respiratory distress.     Breath sounds: Normal breath sounds.  Abdominal:     General: Abdomen is flat.     Palpations: Abdomen is soft.     Tenderness: There is no abdominal tenderness.  Musculoskeletal:     Right lower leg: No edema.     Left lower leg: No edema.  Skin:    General: Skin is warm and dry.  Capillary Refill: Capillary refill takes less than 2 seconds.  Neurological:     Mental Status: He is alert and oriented to person, place, and time. Mental status is at baseline.  Psychiatric:         Mood and Affect: Mood normal.        Behavior: Behavior normal.     ED Results and Treatments Labs (all labs ordered are listed, but only abnormal results are displayed) Labs Reviewed  CBC WITH DIFFERENTIAL/PLATELET - Abnormal; Notable for the following components:      Result Value   RBC 4.17 (*)    Hemoglobin 11.9 (*)    HCT 37.0 (*)    All other components within normal limits  COMPREHENSIVE METABOLIC PANEL - Abnormal; Notable for the following components:   Glucose, Bld 133 (*)    Albumin 3.4 (*)    All other components within normal limits  RESP PANEL BY RT-PCR (FLU A&B, COVID) ARPGX2  LIPASE, BLOOD                                                                                                                          Radiology No results found.  Pertinent labs & imaging results that were available during my care of the patient were reviewed by me and considered in my medical decision making (see MDM for details).  Medications Ordered in ED Medications - No data to display                                                                                                                                   Procedures Procedures  (including critical care time)  Medical Decision Making / ED Course   MDM:  66 year old male presenting to the emergency department for vomiting  Patient well-appearing, examination reassuring including no abdominal tenderness.  Unclear cause of patient's nausea and vomiting although they have resolved without any intervention or treatment.  Unfortunately due to crowding/boarding patient has been in waiting room for 16 hours prior to my evaluation.  He reports that he has felt well during this period and has not had nausea or vomiting.  Will p.o. trial.  Labs overall reassuring.  Very low concern for perforation, pancreatitis, cholecystitis, small bowel obstruction, volvulus, with reassuring abdominal exam and no abdominal pain.  Patient  reported diarrhea to EMS and triage evaluation note, possible gastroenteritis as cause of symptoms.  No head  injury, headaches to suggest intracranial process as cause of vomiting. Will discharge patient to home. All questions answered. Patient comfortable with plan of discharge. Return precautions discussed with patient and specified on the after visit summary.    Additional history obtained: -Additional history obtained from ems  Lab Tests: -I ordered, reviewed, and interpreted labs.   The pertinent results include:   Labs Reviewed  CBC WITH DIFFERENTIAL/PLATELET - Abnormal; Notable for the following components:      Result Value   RBC 4.17 (*)    Hemoglobin 11.9 (*)    HCT 37.0 (*)    All other components within normal limits  COMPREHENSIVE METABOLIC PANEL - Abnormal; Notable for the following components:   Glucose, Bld 133 (*)    Albumin 3.4 (*)    All other components within normal limits  RESP PANEL BY RT-PCR (FLU A&B, COVID) ARPGX2  LIPASE, BLOOD    Notable for very mild anemia, normal lipase/LFTs    Co morbidities that complicate the patient evaluation  Past Medical History:  Diagnosis Date   Abnormal rate of speech    post stroke   Arthritis of left shoulder region    Chronic constipation    Chronic kidney disease    chronic kidney disease    Cognitive deficit due to old cerebral infarction    DDD (degenerative disc disease), cervical    Degenerative cervical spinal stenosis    Gait disturbance, post-stroke    uses cane/weelchair   History of ischemic left MCA stroke 06/27/2013   w/ evidence hemorrhagia, high grade stenosis bilateral proximal MCA and extensive small matter disease--- symptoms resolved, no residual    History of ischemic stroke without residual deficits 05-14-2015---  residual left hemiparesis and cognitive deficits   small nonhemorrhegic infarct right corona radiata posterior limb and progressive right m2 stenosis   Hyperlipidemia     Hypertension    Insomnia    Internal hemorrhoids    Left hemiparesis (HCC)    RESIDUAL FROM STROKE--  WALKS W/ CANE   Lower GI bleed    Mild atherosclerosis of carotid artery, right    RICA 0-39% per duplex 06-28-2013   Neuromuscular disorder (HCC)    neuropathy - feet   OA (osteoarthritis)    LEFT SHOULDER   Painful bladder spasm    Peripheral arterial disease ([place])    Prostate cancer [facility]) urologist-  dr Gaynelle Arabian  oncologist-  dr [name]   dx 12/ 2017-- Stage T1c, Gleason 3+4,  PSA 10.4,  vol 51cc   Sensorineural hearing loss, bilateral    does not wear hearing aids   Stroke [facility])    Type 2 diabetes mellitus treated with insulin ([place])    Urge incontinence    w frequency, wears depends   Urinary retention    Previously required indwelling Foley   Wears dentures       Dispostion: Disposition decision including need for hospitalization was considered, and patient discharged from emergency department.    Final Clinical Impression(s) / ED Diagnoses Final diagnoses:  Nausea vomiting and diarrhea     This chart was dictated using voice recognition software.  Despite best efforts to proofread,  errors can occur which can change the documentation meaning.    Cristie Hem, MD 12/10/21 0800

## 2021-12-10 NOTE — ED Notes (Signed)
Doctor came out earlier calling for patient, we didn't realize patient was under cover in chair sleep. I retaken V/S and saw that he was the patient the Doctor come to see.

## 2021-12-14 ENCOUNTER — Ambulatory Visit: Payer: Medicare (Managed Care) | Admitting: Surgical

## 2021-12-21 ENCOUNTER — Ambulatory Visit (INDEPENDENT_AMBULATORY_CARE_PROVIDER_SITE_OTHER): Payer: Medicare (Managed Care)

## 2021-12-21 ENCOUNTER — Ambulatory Visit (INDEPENDENT_AMBULATORY_CARE_PROVIDER_SITE_OTHER): Payer: Medicare (Managed Care) | Admitting: Orthopedic Surgery

## 2021-12-21 DIAGNOSIS — M25552 Pain in left hip: Secondary | ICD-10-CM | POA: Diagnosis not present

## 2021-12-23 ENCOUNTER — Encounter: Payer: Self-pay | Admitting: Orthopedic Surgery

## 2021-12-23 NOTE — Progress Notes (Signed)
Post-Op Visit Note   Patient: Billy Torres           Date of Birth: [birth year]           MRN: [id number] Visit Date: 12/21/2021 PCP: Janifer Adie, MD   Assessment & Plan:  Chief Complaint:  Chief Complaint  Patient presents with   Left Hip - Follow-up   Visit Diagnoses:  1. Pain of left hip     Plan: Billy Torres is a 66 year old patient underwent left femoral IM nail with cable cerclage 10/27/2021.  Exam relating with his prosthesis.  Minimal pain.  Radiographs show healed fracture.  Plan is weightbearing as tolerated and activity as tolerated.  Follow-up with [place] as needed.  Follow-Up Instructions: No follow-ups on file.   Orders:  Orders Placed This Encounter  Procedures   XR FEMUR MIN 2 VIEWS LEFT   No orders of the defined types were placed in this encounter.   Imaging: No results found.  PMFS History: Patient Active Problem List   Diagnosis Date Noted   Fall at home, initial encounter 10/27/2021   BPH (benign prostatic hyperplasia) 10/27/2021   GERD (gastroesophageal reflux disease) 10/27/2021   Allergic rhinitis 10/27/2021   Closed left hip fracture ([place]) 10/26/2021   Unintentional weight loss 12/12/2020   Diarrhea 12/12/2020   H. pylori infection 12/12/2020   Cecal polyp 08/15/2019   History of rectal ulcer 08/15/2019   History of anemia 08/15/2019   History of colonic polyps 08/15/2019   Acute blood loss anemia 05/25/2019   Hematochezia 05/25/2019   Aortic atherosclerosis ([place]) 05/25/2019   Pressure injury of skin 05/21/2019   Acquired absence of left leg below knee ([place]) 05/05/2019   Acute osteomyelitis of left foot ([place]) 04/28/2019   Cutaneous abscess of left foot    PVD (peripheral vascular disease) ([place]) 12/11/2018   Hydronephrosis due to obstruction of bladder    Acute urinary retention 08/15/2016   Malignant neoplasm of prostate ([place]) 02/26/2016   Muscle spasticity    Dementia (HCC)    Cerebral infarction due to unspecified mechanism     Leukocytosis    Acute thrombotic stroke ([place]) 05/17/2015   Diabetes mellitus due to underlying condition with complication, without long-term current use of insulin (HCC)    Gait disturbance, post-stroke    Hemiparesis (HCC)    Cognitive deficit due to old cerebral infarction    Benign essential HTN    Diabetes mellitus type 2 in nonobese [facility])    Essential hypertension    History of CVA (cerebrovascular accident) without residual deficits    Ataxia, post-stroke    Dysarthria, post-stroke    Tobacco abuse    HLD (hyperlipidemia)    Acute CVA (cerebrovascular accident) ([place]) 05/15/2015   Left-sided weakness 05/14/2015   Right shoulder pain 03/03/2014   History of CVA (cerebrovascular accident) 02/28/2014   Smoking 06/29/2013   CVA (cerebral infarction) 06/29/2013   Dyslipidemia 06/29/2013   TIA (transient ischemic attack) 06/27/2013   Diabetes ([place]) 06/27/2013   Past Medical History:  Diagnosis Date   Abnormal rate of speech    post stroke   Arthritis of left shoulder region    Chronic constipation    Chronic kidney disease    chronic kidney disease    Cognitive deficit due to old cerebral infarction    DDD (degenerative disc disease), cervical    Degenerative cervical spinal stenosis    Gait disturbance, post-stroke    uses cane/weelchair   History of ischemic left MCA  stroke 06/27/2013   w/ evidence hemorrhagia, high grade stenosis bilateral proximal MCA and extensive small matter disease--- symptoms resolved, no residual    History of ischemic stroke without residual deficits 05-14-2015---  residual left hemiparesis and cognitive deficits   small nonhemorrhegic infarct right corona radiata posterior limb and progressive right m2 stenosis   Hyperlipidemia    Hypertension    Insomnia    Internal hemorrhoids    Left hemiparesis ([place])    RESIDUAL FROM STROKE--  WALKS W/ CANE   Lower GI bleed    Mild atherosclerosis of carotid artery, right    RICA 0-39% per duplex  06-28-2013   Neuromuscular disorder (HCC)    neuropathy - feet   OA (osteoarthritis)    LEFT SHOULDER   Painful bladder spasm    Peripheral arterial disease ([place])    Prostate cancer [facility]) urologist-  dr Gaynelle Arabian  oncologist-  dr [name]   dx 12/ 2017-- Stage T1c, Gleason 3+4,  PSA 10.4,  vol 51cc   Sensorineural hearing loss, bilateral    does not wear hearing aids   Stroke [facility])    Type 2 diabetes mellitus treated with insulin (HCC)    Urge incontinence    w frequency, wears depends   Urinary retention    Previously required indwelling Foley   Wears dentures     Family History  Problem Relation Age of Onset   Hypertension Mother    Hypertension Father    Cancer Brother        prostate    Past Surgical History:  Procedure Laterality Date   ABDOMINAL AORTOGRAM W/LOWER EXTREMITY N/A 12/11/2018   Procedure: ABDOMINAL AORTOGRAM W/LOWER EXTREMITY;  Surgeon: Angelia Mould, MD;  Location: [place] CV LAB;  Service: Cardiovascular;  Laterality: N/A;   ABDOMINAL AORTOGRAM W/LOWER EXTREMITY Bilateral 04/02/2019   Procedure: ABDOMINAL AORTOGRAM W/LOWER EXTREMITY;  Surgeon: Angelia Mould, MD;  Location: [place] CV LAB;  Service: Cardiovascular;  Laterality: Bilateral;   AMPUTATION Left 04/28/2019   Procedure: LEFT BELOW KNEE AMPUTATION;  Surgeon: Newt Minion, MD;  Location: [place];  Service: Orthopedics;  Laterality: Left;   ANTERIOR CERVICAL DECOMP/DISCECTOMY FUSION  11/27/1999   C5 -- C6   BELOW KNEE LEG AMPUTATION Left 04/28/2019   BIOPSY  05/21/2019   Procedure: BIOPSY;  Surgeon: Doran Stabler, MD;  Location: [place];  Service: Gastroenterology;;   BIOPSY  12/20/2019   Procedure: BIOPSY;  Surgeon: Irving Copas., MD;  Location: [place];  Service: Gastroenterology;;   BIOPSY  02/19/2021   Procedure: BIOPSY;  Surgeon: Irving Copas., MD;  [facility];  Service: Gastroenterology;;   CIRCUMCISION  10/07/2001    COLONOSCOPY WITH PROPOFOL N/A 05/21/2019   Procedure: COLONOSCOPY WITH PROPOFOL;  Surgeon: Doran Stabler, MD;  Location: [place];  Service: Gastroenterology;  Laterality: N/A;   COLONOSCOPY WITH PROPOFOL N/A 12/20/2019   Procedure: COLONOSCOPY WITH PROPOFOL;  Surgeon: Rush Landmark Telford Nab., MD;  Location: [place];  Service: Gastroenterology;  Laterality: N/A;   COLONOSCOPY WITH PROPOFOL N/A 02/19/2021   Procedure: COLONOSCOPY WITH PROPOFOL;  Surgeon: Rush Landmark Telford Nab., MD;  [facility];  Service: Gastroenterology;  Laterality: N/A;   CYSTOSCOPY WITH INSERTION OF UROLIFT N/A 08/11/2017   Procedure: CYSTOSCOPY WITH INSERTION OF UROLIFT;  Surgeon: Cleon Gustin, MD;  [facility];  Service: Urology;  Laterality: N/A;   ENDOSCOPIC MUCOSAL RESECTION N/A 12/20/2019   Procedure: ENDOSCOPIC MUCOSAL RESECTION;  Surgeon: Rush Landmark Telford Nab., MD;  [facility]: [facility];  Service: Gastroenterology;  Laterality: N/A;   ENDOSCOPIC MUCOSAL RESECTION N/A 02/19/2021   Procedure: ENDOSCOPIC MUCOSAL RESECTION;  Surgeon: Rush Landmark Telford Nab., MD;  [facility];  Service: Gastroenterology;  Laterality: N/A;   ESOPHAGOGASTRODUODENOSCOPY N/A 02/19/2021   Procedure: ESOPHAGOGASTRODUODENOSCOPY (EGD);  Surgeon: Irving Copas., MD;  Location[facility];  Service: Gastroenterology;  Laterality: N/A;   ESOPHAGOGASTRODUODENOSCOPY (EGD) WITH PROPOFOL N/A 12/20/2019   Procedure: ESOPHAGOGASTRODUODENOSCOPY (EGD) WITH PROPOFOL;  Surgeon: Rush Landmark Telford Nab., MD;  Location: [place];  Service: Gastroenterology;  Laterality: N/A;   FLEXIBLE SIGMOIDOSCOPY N/A 05/23/2019   Procedure: FLEXIBLE SIGMOIDOSCOPY;  Surgeon: Doran Stabler, MD;  Location: [place];  Service: Gastroenterology;  Laterality: N/A;   FLEXIBLE SIGMOIDOSCOPY N/A 05/26/2019   Procedure: FLEXIBLE SIGMOIDOSCOPY;  Surgeon: Milus Banister, MD;  Location[facility];  Service: Endoscopy;   Laterality: N/A;   FOOT SURGERY  2005   HEMOSTASIS CLIP PLACEMENT  05/23/2019   Procedure: HEMOSTASIS CLIP PLACEMENT;  Surgeon: Doran Stabler, MD;  Location: [place];  Service: Gastroenterology;;   HEMOSTASIS CLIP PLACEMENT  05/26/2019   Procedure: HEMOSTASIS CLIP PLACEMENT;  Surgeon: Milus Banister, MD;  Location: [place];  Service: Endoscopy;;   HEMOSTASIS CLIP PLACEMENT  12/20/2019   Procedure: HEMOSTASIS CLIP PLACEMENT;  Surgeon: Irving Copas., MD;  Location: [place];  Service: Gastroenterology;;   HEMOSTASIS CLIP PLACEMENT  02/19/2021   Procedure: HEMOSTASIS CLIP PLACEMENT;  Surgeon: Irving Copas., MD;  Location: [facility];  Service: Gastroenterology;;   INSERTION OF TRACTION PIN Left 10/27/2021   Procedure: INSERTION AND REMOVAL OF TRACTION PIN;  Surgeon: Meredith Pel, MD;  Location: [place];  Service: Orthopedics;  Laterality: Left;   INTRAMEDULLARY (IM) NAIL INTERTROCHANTERIC Left 10/27/2021   Procedure: INTRAMEDULLARY (IM) NAIL INTERTROCHANTERIC;  Surgeon: Meredith Pel, MD;  Location: [place];  Service: Orthopedics;  Laterality: Left;   PERIPHERAL VASCULAR BALLOON ANGIOPLASTY  12/11/2018   Procedure: PERIPHERAL VASCULAR BALLOON ANGIOPLASTY;  Surgeon: Angelia Mould, MD;  Location: [place] CV LAB;  Service: Cardiovascular;;  Lt. SFA   POLYPECTOMY  12/20/2019   Procedure: POLYPECTOMY;  Surgeon: Rush Landmark Telford Nab., MD;  Location: [place];  Service: Gastroenterology;;   POLYPECTOMY  02/19/2021   Procedure: POLYPECTOMY;  Surgeon: Irving Copas., MD;  [facility];  Service: Gastroenterology;;   PROSTATE BIOPSY  12/12/2015   RADIOACTIVE SEED IMPLANT N/A 05/30/2016   Procedure: RADIOACTIVE SEED IMPLANT/BRACHYTHERAPY IMPLANT WITH SPACE OAR;  Surgeon: Carolan Clines, MD;  [facility];  Service: Urology;  Laterality: N/A;   SHOULDER ARTHROSCOPY WITH SUBACROMIAL DECOMPRESSION  Left 11/28/2006   Debridement/ Acromioplasty/  CA ligament relesase   SUBMUCOSAL LIFTING INJECTION  12/20/2019   Procedure: SUBMUCOSAL LIFTING INJECTION;  Surgeon: Rush Landmark Telford Nab., MD;  Location: [place];  Service: Gastroenterology;;   SUBMUCOSAL LIFTING INJECTION  02/19/2021   Procedure: SUBMUCOSAL LIFTING INJECTION;  Surgeon: Irving Copas., MD;  [facility];  Service: Gastroenterology;;   TRANSTHORACIC ECHOCARDIOGRAM  05/15/2015   mild LVH,  ef 65-70%, codominant mitral inflow-iindeterminate diastolic function/     Social History   Occupational History   Not on file  Tobacco Use   Smoking status: Former    Packs/day: 0.25    Years: 45.00    Total pack years: 11.25    Types: Cigarettes   Smokeless tobacco: Never  Vaping Use   Vaping Use: Never used  Substance and Sexual Activity   Alcohol use: No  Drug use: No   Sexual activity: Yes

## 2022-06-17 ENCOUNTER — Other Ambulatory Visit: Payer: Self-pay | Admitting: Internal Medicine

## 2022-06-17 ENCOUNTER — Other Ambulatory Visit (HOSPITAL_COMMUNITY): Payer: Self-pay | Admitting: Vascular Surgery

## 2022-06-17 DIAGNOSIS — N401 Enlarged prostate with lower urinary tract symptoms: Secondary | ICD-10-CM

## 2022-06-17 DIAGNOSIS — I69391 Dysphagia following cerebral infarction: Secondary | ICD-10-CM

## 2022-07-02 ENCOUNTER — Ambulatory Visit (INDEPENDENT_AMBULATORY_CARE_PROVIDER_SITE_OTHER): Payer: Medicare (Managed Care) | Admitting: Podiatry

## 2022-07-02 DIAGNOSIS — Z89432 Acquired absence of left foot: Secondary | ICD-10-CM

## 2022-07-02 DIAGNOSIS — M79674 Pain in right toe(s): Secondary | ICD-10-CM | POA: Diagnosis not present

## 2022-07-02 DIAGNOSIS — B351 Tinea unguium: Secondary | ICD-10-CM | POA: Diagnosis not present

## 2022-07-02 NOTE — Progress Notes (Signed)
       Subjective:  Patient ID: Billy Torres, male    DOB: 03-26-1955,  MRN: [id number]   Caprice Beaver Dewilde presents to clinic today for:  Chief Complaint  Patient presents with   Nail Problem    [facility]  . Patient notes right fifth toe nails are thick, discolored, elongated and painful in shoegear when trying to ambulate.  He has a previous left foot amputation with prosthesis.  He uses a walker to assist with ambulation  PCP is Jethro Bastos, MD.  No Known Allergies  Review of Systems: Negative except as noted in the HPI.  Objective:  There were no vitals filed for this visit.  CALHOUN REICHARDT is a pleasant 67 y.o. male in NAD. AAO x 3.  Vascular Examination: Capillary refill time is 3-5 seconds to toes . Palpable pedal pulses LE. Digital hair present minimal pedal edema right foot.  Skin temperature gradient WNL.  No cyanosis or clubbing noted.   Dermatological Examination: Pedal skin with normal turgor, texture and tone. No open wounds. No interdigital macerations. Toenails x 5 are 3mm thick, discolored, dystrophic with subungual debris. There is pain with compression of the nail plates.  They are elongated x5     Latest Ref Rng & Units 10/27/2021    5:27 AM  Hemoglobin A1C  Hemoglobin-A1c 4.8 - 5.6 % 10.6    Assessment/Plan: 1. Dermatophytosis of nail   2. History of amputation of left foot (HCC)   3. Pain in right toe(s)    The mycotic toenails were sharply debrided x 5 with sterile nail nippers and a power debriding burr to decrease bulk/thickness and length.    Return in about 3 months (around 10/02/2022) for [facility].   Clerance Lav, DPM, FACFAS [facility]     [address] [place], [place] [address]                Office [phone]  Fax [phone]

## 2022-07-03 ENCOUNTER — Ambulatory Visit (HOSPITAL_COMMUNITY)
Admission: RE | Admit: 2022-07-03 | Discharge: 2022-07-03 | Disposition: A | Discharge status: Home / Self Care | Payer: Medicare (Managed Care) | Source: Ambulatory Visit | Attending: Vascular Surgery | Admitting: Vascular Surgery

## 2022-07-03 DIAGNOSIS — I69391 Dysphagia following cerebral infarction: Secondary | ICD-10-CM | POA: Diagnosis present

## 2022-07-03 NOTE — Progress Notes (Signed)
Bilateral carotid ultrasound study completed.   Please see CV Procedures for preliminary results.  Christene Lye, RVT  9:49 AM 07/03/22

## 2022-07-05 ENCOUNTER — Ambulatory Visit
Admission: RE | Admit: 2022-07-05 | Discharge: 2022-07-05 | Disposition: A | Discharge status: Home / Self Care | Payer: Medicare (Managed Care) | Source: Ambulatory Visit | Attending: Internal Medicine

## 2022-07-05 DIAGNOSIS — N401 Enlarged prostate with lower urinary tract symptoms: Secondary | ICD-10-CM

## 2022-08-12 ENCOUNTER — Emergency Department (HOSPITAL_COMMUNITY)
Admission: EM | Admit: 2022-08-12 | Discharge: 2022-08-13 | Disposition: A | Discharge status: Home / Self Care | Payer: Medicare (Managed Care) | Attending: Emergency Medicine | Admitting: Emergency Medicine

## 2022-08-12 DIAGNOSIS — N3090 Cystitis, unspecified without hematuria: Secondary | ICD-10-CM

## 2022-08-12 DIAGNOSIS — Z7982 Long term (current) use of aspirin: Secondary | ICD-10-CM | POA: Diagnosis not present

## 2022-08-12 DIAGNOSIS — E119 Type 2 diabetes mellitus without complications: Secondary | ICD-10-CM | POA: Insufficient documentation

## 2022-08-12 DIAGNOSIS — Z8673 Personal history of transient ischemic attack (TIA), and cerebral infarction without residual deficits: Secondary | ICD-10-CM | POA: Insufficient documentation

## 2022-08-12 DIAGNOSIS — I10 Essential (primary) hypertension: Secondary | ICD-10-CM | POA: Insufficient documentation

## 2022-08-12 DIAGNOSIS — R310 Gross hematuria: Secondary | ICD-10-CM | POA: Insufficient documentation

## 2022-08-12 DIAGNOSIS — Z8546 Personal history of malignant neoplasm of prostate: Secondary | ICD-10-CM | POA: Insufficient documentation

## 2022-08-12 DIAGNOSIS — Z79899 Other long term (current) drug therapy: Secondary | ICD-10-CM | POA: Diagnosis not present

## 2022-08-12 DIAGNOSIS — Z7984 Long term (current) use of oral hypoglycemic drugs: Secondary | ICD-10-CM | POA: Insufficient documentation

## 2022-08-12 DIAGNOSIS — N3091 Cystitis, unspecified with hematuria: Secondary | ICD-10-CM | POA: Diagnosis not present

## 2022-08-12 DIAGNOSIS — R319 Hematuria, unspecified: Secondary | ICD-10-CM | POA: Diagnosis present

## 2022-08-12 LAB — URINALYSIS, ROUTINE W REFLEX MICROSCOPIC
Bilirubin Urine: NEGATIVE
Glucose, UA: >=500 mg/dL — AB
Ketones, ur: NEGATIVE mg/dL
Nitrite: NEGATIVE
Protein, ur: 30 mg/dL — AB
RBC / HPF: >50 RBC/hpf (ref 0–5)
Specific Gravity, Urine: 1.008 (ref 1.005–1.030)
WBC, UA: >50 WBC/hpf (ref 0–5)
pH: 7 (ref 5.0–8.0)

## 2022-08-12 LAB — CBC WITH DIFFERENTIAL/PLATELET
Abs Immature Granulocytes: 0.03 10*3/uL (ref 0.00–0.07)
Basophils Absolute: 0.1 10*3/uL (ref 0.0–0.1)
Basophils Relative: 1 %
Eosinophils Absolute: 0.1 10*3/uL (ref 0.0–0.5)
Eosinophils Relative: 1 %
HCT: 40.6 % (ref 39.0–52.0)
Hemoglobin: 12.6 g/dL — ABNORMAL LOW (ref 13.0–17.0)
Immature Granulocytes: 0 %
Lymphocytes Relative: 19 %
Lymphs Abs: 2 10*3/uL (ref 0.7–4.0)
MCH: 28.3 pg (ref 26.0–34.0)
MCHC: 31 g/dL (ref 30.0–36.0)
MCV: 91 fL (ref 80.0–100.0)
Monocytes Absolute: 0.8 10*3/uL (ref 0.1–1.0)
Monocytes Relative: 8 %
Neutro Abs: 7.1 10*3/uL (ref 1.7–7.7)
Neutrophils Relative %: 71 %
Platelets: 255 10*3/uL (ref 150–400)
RBC: 4.46 MIL/uL (ref 4.22–5.81)
RDW: 12.9 % (ref 11.5–15.5)
WBC: 10.1 10*3/uL (ref 4.0–10.5)
nRBC: 0 % (ref 0.0–0.2)

## 2022-08-12 LAB — COMPREHENSIVE METABOLIC PANEL
ALT: 16 U/L (ref 0–44)
AST: 15 U/L (ref 15–41)
Albumin: 3.8 g/dL (ref 3.5–5.0)
Alkaline Phosphatase: 52 U/L (ref 38–126)
Anion gap: 10 (ref 5–15)
BUN: 21 mg/dL (ref 8–23)
CO2: 23 mmol/L (ref 22–32)
Calcium: 9.4 mg/dL (ref 8.9–10.3)
Chloride: 106 mmol/L (ref 98–111)
Creatinine, Ser: 1.13 mg/dL (ref 0.61–1.24)
GFR, Estimated: >60 mL/min (ref >60)
Glucose, Bld: 106 mg/dL — ABNORMAL HIGH (ref 70–99)
Potassium: 4.3 mmol/L (ref 3.5–5.1)
Sodium: 139 mmol/L (ref 135–145)
Total Bilirubin: 0.7 mg/dL (ref 0.3–1.2)
Total Protein: 7.3 g/dL (ref 6.5–8.1)

## 2022-08-12 MED ORDER — CEPHALEXIN 500 MG PO CAPS: 500.0000 mg | ORAL_CAPSULE | Freq: Four times a day (QID) | ORAL | 0 refills | Status: AC

## 2022-08-12 MED ORDER — CEPHALEXIN 250 MG PO CAPS
500.0000 mg | ORAL_CAPSULE | Freq: Once | ORAL | Status: AC
  Administered 2022-08-12: 500 mg via ORAL
  Filled 2022-08-12: qty 2

## 2022-08-12 MED ORDER — LACTATED RINGERS IV BOLUS
1000.0000 mL | Freq: Once | INTRAVENOUS | Status: AC
  Administered 2022-08-12: 1000 mL via INTRAVENOUS

## 2022-08-12 NOTE — Discharge Instructions (Signed)
You were seen in the emergency department for your blood in your urine.  It did appear that you have a urinary tract infection.  I have given you antibiotics and you should complete this as prescribed.  With your prior history of prostate cancer you should follow-up with urology to have your symptoms rechecked and to make sure the bleeding resolves.  You should return to the emergency department if you have fevers despite the antibiotics, you have severe abdominal pain, you are unable to urinate or if you have any other new or concerning symptoms.

## 2022-08-12 NOTE — ED Provider Triage Note (Signed)
Emergency Medicine Provider Triage Evaluation Note  Billy Torres , a 67 y.o. male  was evaluated in triage.  Pt complains of hematuria that started last night.  Denies any dysuria.  Denies any history of prostate or kidney problems.  Denies any fevers or chills, back pain.  Review of Systems  Positive: As above Negative: As above  Physical Exam  BP 118/69 (BP Location: Right Arm)   Pulse 66   Temp 98.5 F (36.9 C) (Oral)   Resp 12   SpO2 97%  Gen:   Awake, no distress   Resp:  Normal effort MSK:   Moves extremities without difficulty  Other:  Hard of hearing  Medical Decision Making  Medically screening exam initiated at 2:42 PM.  Appropriate orders placed.  Billy Torres was informed that the remainder of the evaluation will be completed by another provider, this initial triage assessment does not replace that evaluation, and the importance of remaining in the ED until their evaluation is complete.     Arabella Merles, PA-C 08/12/22 1443

## 2022-08-12 NOTE — ED Notes (Signed)
This Clinical research associate and Zelphia Cairo, Charity fundraiser. Both attempted to contact every number listed on patient's contact list multiple times to organize transport home. No answer on any number.

## 2022-08-12 NOTE — ED Provider Notes (Addendum)
[place] EMERGENCY DEPARTMENT AT [facility] Provider Note   CSN: [id number] Arrival date & time: 08/12/22  1411     History  Chief Complaint  Patient presents with   Hematuria    Billy Torres is a 67 y.o. male.  Patient is a 67 year old male with a past medical history of prior prostate cancer no longer on chemotherapy, hypertension, diabetes, CVA without deficits presenting to the emergency department with hematuria.  The patient states that he woke up this morning with bright red blood in his urine.  He states he cannot see any blood clots.  He states he has associated dysuria.  He denies any abdominal or back pain.  He denies any fevers, nausea or vomiting.  He states that he is unsure if he is emptying his bladder all the way.  He denies any blood thinner use.  The history is provided by the patient.  Hematuria       Home Medications Prior to Admission medications   Medication Sig Start Date End Date Taking? Authorizing Provider  cephALEXin (KEFLEX) 500 MG capsule Take 1 capsule (500 mg total) by mouth 4 (four) times daily for 7 days. 08/12/22 08/19/22 Yes Elayne Snare K, DO  acetaminophen (TYLENOL) 500 MG tablet Take 1,000 mg by mouth 3 (three) times daily as needed (pain).    [provider]  alfuzosin (UROXATRAL) 10 MG 24 hr tablet Take 10 mg by mouth daily with breakfast.    [provider]  aspirin EC 81 MG tablet Take 81 mg by mouth daily.    [provider]  atorvastatin (LIPITOR) 40 MG tablet Take 40 mg by mouth at bedtime.    [provider]  Cholecalciferol (VITAMIN D3) 50 MCG (2000 UT) TABS Take 2,000 Units by mouth in the morning.    [provider]  donepezil (ARICEPT) 10 MG tablet Take 10 mg by mouth at bedtime.    [provider]  gabapentin (NEURONTIN) 600 MG tablet Take 600 mg by mouth in the morning.    [provider]  gabapentin (NEURONTIN) 800 MG tablet Take 800 mg by mouth  at bedtime.    [provider]  lisinopril (ZESTRIL) 20 MG tablet Take 20 mg by mouth daily.    [provider]  loratadine (CLARITIN) 10 MG tablet Take 10 mg by mouth daily.    [provider]  melatonin 5 MG TABS Take 5 mg by mouth at bedtime.    [provider]  metFORMIN (GLUCOPHAGE) 500 MG tablet Take 500 mg by mouth 2 (two) times daily.    [provider]  montelukast (SINGULAIR) 5 MG chewable tablet Chew 5 mg by mouth at bedtime.    [provider]  polyethylene glycol (MIRALAX / GLYCOLAX) 17 g packet Take 17 g by mouth daily. 02/19/21   Mansouraty, Netty Starring., MD  Semaglutide (RYBELSUS) 7 MG TABS Take 7 mg by mouth daily.    [provider]      Allergies    Patient has no known allergies.    Review of Systems   Review of Systems  Genitourinary:  Positive for hematuria.    Physical Exam Updated Vital Signs BP (!) 146/74 (BP Location: Right Arm)   Pulse 69   Temp 98.2 F (36.8 C) (Oral)   Resp 18   SpO2 97%  Physical Exam Vitals and nursing note reviewed.  Constitutional:      General: He is not in acute distress.  Appearance: Normal appearance.  HENT:     Head: Normocephalic and atraumatic.     Nose: Nose normal.     Mouth/Throat:     Mouth: Mucous membranes are moist.     Pharynx: Oropharynx is clear.  Eyes:     Extraocular Movements: Extraocular movements intact.     Conjunctiva/sclera: Conjunctivae normal.  Cardiovascular:     Rate and Rhythm: Normal rate and regular rhythm.     Heart sounds: Normal heart sounds.  Pulmonary:     Effort: Pulmonary effort is normal.     Breath sounds: Normal breath sounds.  Abdominal:     General: Abdomen is flat.     Palpations: Abdomen is soft.     Tenderness: There is no abdominal tenderness. There is no right CVA tenderness or left CVA tenderness.  Musculoskeletal:        General: Normal range of motion.     Cervical back: Normal range of motion.   Skin:    General: Skin is warm and dry.  Neurological:     General: No focal deficit present.     Mental Status: He is alert and oriented to person, place, and time.  Psychiatric:        Mood and Affect: Mood normal.        Behavior: Behavior normal.     ED Results / Procedures / Treatments   Labs (all labs ordered are listed, but only abnormal results are displayed) Labs Reviewed  URINALYSIS, ROUTINE W REFLEX MICROSCOPIC - Abnormal; Notable for the following components:      Result Value   APPearance CLOUDY (*)    Glucose, UA >=500 (*)    Hgb urine dipstick LARGE (*)    Protein, ur 30 (*)    Leukocytes,Ua LARGE (*)    Bacteria, UA FEW (*)    All other components within normal limits  CBC WITH DIFFERENTIAL/PLATELET - Abnormal; Notable for the following components:   Hemoglobin 12.6 (*)    All other components within normal limits  COMPREHENSIVE METABOLIC PANEL - Abnormal; Notable for the following components:   Glucose, Bld 106 (*)    All other components within normal limits  CBC WITH DIFFERENTIAL/PLATELET    EKG None  Radiology No results found.  Procedures Procedures    Medications Ordered in ED Medications  lactated ringers bolus 1,000 mL (1,000 mLs Intravenous New Bag/Given 08/12/22 1945)  cephALEXin (KEFLEX) capsule 500 mg (500 mg Oral Given 08/12/22 2147)    ED Course/ Medical Decision Making/ A&P Clinical Course as of 08/12/22 2158  [facility] Aug 12, 2022  1904 Bladder scan 127. Will hydrate and reassess. [VK]  2112 Large leuks, few bacteria. With symptoms will treat for UTI. Recommended urology follow up. [VK]  2158 Unable to get ahold of family. SW consulted for transportation needs. [VK]    Clinical Course User Index [VK] Rexford Maus, DO                                 Medical Decision Making This patient presents to the ED with chief complaint(s) of hematuria with pertinent past medical history of prostate cancer, hypertension, diabetes, CVA  without residual deficits which further complicates the presenting complaint. The complaint involves an extensive differential diagnosis and also carries with it a high risk of complications and morbidity.    The differential diagnosis includes UTI, malignancy, obstruction, coagulopathy, anemia  Additional history obtained: Additional  history obtained from N/A Records reviewed Primary Care Documents  ED Course and Reassessment: On patient's arrival he is hemodynamically stable in no acute distress.  He was initially evaluated by triage and had labs and urine ordered.  Labs showed normal renal function, no worsening anemia from his baseline.  Urine is pending at this time.  He will bladder scan performed to evaluate for obstruction.  He will be closely reassessed.  Independent labs interpretation:  The following labs were independently interpreted: hematuria with bacteria and leuks concerning for UTI, labs otherwise within normal range  Independent visualization of imaging: - N/A  Consultation: - Consulted or discussed management/test interpretation w/ external professional: N/A  Consideration for admission or further workup: Patient has no emergent conditions requiring admission or further work-up at this time and is stable for discharge home with urology follow-up  Social Determinants of health: N/A    Risk Prescription drug management.          Final Clinical Impression(s) / ED Diagnoses Final diagnoses:  Gross hematuria  Cystitis    Rx / DC Orders ED Discharge Orders          Ordered    cephALEXin (KEFLEX) 500 MG capsule  4 times daily        08/12/22 2131              Rexford Maus, DO 08/12/22 2132    Elayne Snare K, DO 08/12/22 2158

## 2022-08-12 NOTE — ED Triage Notes (Signed)
Pt to ED via EMS from [place]. Pt c/o hematuria since last night. Pt endorses pain only while urinating. Pt has hx of prostate cancer. Pt denies pain. Pt denies N/V. Pt denies trouble while urinating, just states that it is painful.   EMS Vitals: 137/68 70 HR 97% RA 180 CBG 98.3 temp

## 2022-08-12 NOTE — ED Notes (Signed)
Attempted to collect urine sample from pt. Pt stated he is unable to provide sample at this time.  

## 2022-08-12 NOTE — ED Notes (Signed)
Pt cannot urinate per pt at

## 2022-08-12 NOTE — ED Notes (Signed)
Attempted to call pt's son to pick pt up for DC with no answer.

## 2022-08-12 NOTE — ED Notes (Signed)
Pt given water to drink. 

## 2022-08-13 NOTE — ED Notes (Signed)
RN spoke with Durward Mallard who stated that [facility] will pic pt up from ED around 10-10:30 and then bring pt to [facility]. Pt verbalized understanding of plan.

## 2022-08-13 NOTE — Discharge Planning (Signed)
Billy Torres, SW with [facility] returned call.  They will pick up pt by 10:30 and take him to the day program before transporting him home.

## 2022-08-13 NOTE — Discharge Planning (Signed)
TOC consulted regarding pt needing access in home.  RNCM reached out to [facility] for assistance and spoke to [place], [place].  Marylene Land states they will pick pt up and get him into his home as they have access to do so.  RNCM will update team when [facility] is on the way.

## 2022-09-06 ENCOUNTER — Ambulatory Visit: Payer: Medicare (Managed Care) | Admitting: Physician Assistant

## 2022-09-30 ENCOUNTER — Encounter: Payer: Self-pay | Admitting: Podiatry

## 2022-09-30 ENCOUNTER — Ambulatory Visit (INDEPENDENT_AMBULATORY_CARE_PROVIDER_SITE_OTHER): Payer: Medicare (Managed Care) | Admitting: Podiatry

## 2022-09-30 DIAGNOSIS — B351 Tinea unguium: Secondary | ICD-10-CM

## 2022-09-30 DIAGNOSIS — M79674 Pain in right toe(s): Secondary | ICD-10-CM

## 2022-09-30 DIAGNOSIS — Z89432 Acquired absence of left foot: Secondary | ICD-10-CM

## 2022-09-30 NOTE — Progress Notes (Unsigned)
Subjective:  Patient ID: Billy Torres, male    DOB: 1955/04/25,  MRN: [id number]  Billy Torres presents to clinic today for:  Chief Complaint  Patient presents with   Nail Problem    Nail trim   Patient notes nails are thick and elongated, causing pain in shoe gear when ambulating.    PCP is Dr. Marny Lowenstein who referred the patient here for care today.  Date last seen was within the past month  Past Medical History:  Diagnosis Date   Abnormal rate of speech    post stroke   Arthritis of left shoulder region    Chronic constipation    Chronic kidney disease    chronic kidney disease    Cognitive deficit due to old cerebral infarction    DDD (degenerative disc disease), cervical    Degenerative cervical spinal stenosis    Gait disturbance, post-stroke    uses cane/weelchair   History of ischemic left MCA stroke 06/27/2013   w/ evidence hemorrhagia, high grade stenosis bilateral proximal MCA and extensive small matter disease--- symptoms resolved, no residual    History of ischemic stroke without residual deficits 05-14-2015---  residual left hemiparesis and cognitive deficits   small nonhemorrhegic infarct right corona radiata posterior limb and progressive right m2 stenosis   Hyperlipidemia    Hypertension    Insomnia    Internal hemorrhoids    Left hemiparesis (HCC)    RESIDUAL FROM STROKE--  WALKS W/ CANE   Lower GI bleed    Mild atherosclerosis of carotid artery, right    RICA 0-39% per duplex 06-28-2013   Neuromuscular disorder (HCC)    neuropathy - feet   OA (osteoarthritis)    LEFT SHOULDER   Painful bladder spasm    Peripheral arterial disease (HCC)    Prostate cancer [facility]) urologist-  dr Patsi Sears  oncologist-  dr Kathrynn Running   dx 12/ 2017-- Stage T1c, Gleason 3+4,  PSA 10.4,  vol 51cc   Sensorineural hearing loss, bilateral    does not wear hearing aids   Stroke [facility])    Type 2 diabetes mellitus treated with insulin (HCC)    Urge incontinence     w frequency, wears depends   Urinary retention    Previously required indwelling Foley   Wears dentures     No Known Allergies  Objective:  Billy Torres is a pleasant 67 y.o. male in NAD. AAO x 3.  Vascular Examination: Patient has palpable DP pulse, absent PT pulse right.  Delayed capillary refill to toes.  Sparse digital hair.  Proximal to distal cooling WNL.    Dermatological Examination: Interspaces are clear with no open lesions noted.  Skin is shiny and atrophic.  Nails are 3-24mm thick, with yellowish/brown discoloration, subungual debris and distal onycholysis x5.  There is pain with compression of nails x5.      Latest Ref Rng & Units 10/27/2021    5:27 AM  Hemoglobin A1C  Hemoglobin-A1c 4.8 - 5.6 % 10.6    Patient qualifies for at-risk foot care because of left foot amputation (Q7) .  Assessment/Plan: 1. Dermatophytosis of nail   2. History of amputation of left foot (HCC)   3. Pain in right toe(s)    Mycotic nails x5 were sharply debrided with sterile nail nippers and power debriding burr to decrease bulk and length   Return in about 3 months (around 12/30/2022) for [facility].   Clerance Lav, DPM, FACFAS [facility]     [address] [place], [place] [address]                Office [phone]  Fax [phone]

## 2023-01-29 ENCOUNTER — Emergency Department (HOSPITAL_COMMUNITY): Payer: Medicare (Managed Care)

## 2023-01-29 ENCOUNTER — Encounter (HOSPITAL_COMMUNITY): Payer: Self-pay | Admitting: Emergency Medicine

## 2023-01-29 ENCOUNTER — Emergency Department (HOSPITAL_COMMUNITY)
Admission: EM | Admit: 2023-01-29 | Discharge: 2023-01-29 | Discharge status: Against Medical Advice | Payer: Medicare (Managed Care) | Attending: Emergency Medicine | Admitting: Emergency Medicine

## 2023-01-29 ENCOUNTER — Other Ambulatory Visit: Payer: Self-pay

## 2023-01-29 DIAGNOSIS — E119 Type 2 diabetes mellitus without complications: Secondary | ICD-10-CM | POA: Insufficient documentation

## 2023-01-29 DIAGNOSIS — S0990XA Unspecified injury of head, initial encounter: Secondary | ICD-10-CM | POA: Diagnosis present

## 2023-01-29 DIAGNOSIS — Z7982 Long term (current) use of aspirin: Secondary | ICD-10-CM | POA: Diagnosis not present

## 2023-01-29 DIAGNOSIS — W19XXXA Unspecified fall, initial encounter: Secondary | ICD-10-CM | POA: Insufficient documentation

## 2023-01-29 DIAGNOSIS — Z79899 Other long term (current) drug therapy: Secondary | ICD-10-CM | POA: Diagnosis not present

## 2023-01-29 DIAGNOSIS — Z8546 Personal history of malignant neoplasm of prostate: Secondary | ICD-10-CM | POA: Diagnosis not present

## 2023-01-29 DIAGNOSIS — I10 Essential (primary) hypertension: Secondary | ICD-10-CM | POA: Insufficient documentation

## 2023-01-29 DIAGNOSIS — Z7984 Long term (current) use of oral hypoglycemic drugs: Secondary | ICD-10-CM | POA: Insufficient documentation

## 2023-01-29 DIAGNOSIS — M25562 Pain in left knee: Secondary | ICD-10-CM | POA: Insufficient documentation

## 2023-01-29 LAB — CBC WITH DIFFERENTIAL/PLATELET
Abs Immature Granulocytes: 0.01 10*3/uL (ref 0.00–0.07)
Basophils Absolute: 0 10*3/uL (ref 0.0–0.1)
Basophils Relative: 1 %
Eosinophils Absolute: 0 10*3/uL (ref 0.0–0.5)
Eosinophils Relative: 1 %
HCT: 38.8 % — ABNORMAL LOW (ref 39.0–52.0)
Hemoglobin: 12.1 g/dL — ABNORMAL LOW (ref 13.0–17.0)
Immature Granulocytes: 0 %
Lymphocytes Relative: 13 %
Lymphs Abs: 0.8 10*3/uL (ref 0.7–4.0)
MCH: 28.5 pg (ref 26.0–34.0)
MCHC: 31.2 g/dL (ref 30.0–36.0)
MCV: 91.5 fL (ref 80.0–100.0)
Monocytes Absolute: 0.9 10*3/uL (ref 0.1–1.0)
Monocytes Relative: 14 %
Neutro Abs: 4.4 10*3/uL (ref 1.7–7.7)
Neutrophils Relative %: 71 %
Platelets: 201 10*3/uL (ref 150–400)
RBC: 4.24 MIL/uL (ref 4.22–5.81)
RDW: 13 % (ref 11.5–15.5)
WBC: 6.2 10*3/uL (ref 4.0–10.5)
nRBC: 0 % (ref 0.0–0.2)

## 2023-01-29 LAB — COMPREHENSIVE METABOLIC PANEL
ALT: 15 U/L (ref 0–44)
AST: 17 U/L (ref 15–41)
Albumin: 3.2 g/dL — ABNORMAL LOW (ref 3.5–5.0)
Alkaline Phosphatase: 66 U/L (ref 38–126)
Anion gap: 7 (ref 5–15)
BUN: 17 mg/dL (ref 8–23)
CO2: 27 mmol/L (ref 22–32)
Calcium: 9.1 mg/dL (ref 8.9–10.3)
Chloride: 105 mmol/L (ref 98–111)
Creatinine, Ser: 1.19 mg/dL (ref 0.61–1.24)
GFR, Estimated: >60 mL/min (ref >60)
Glucose, Bld: 269 mg/dL — ABNORMAL HIGH (ref 70–99)
Potassium: 4.5 mmol/L (ref 3.5–5.1)
Sodium: 139 mmol/L (ref 135–145)
Total Bilirubin: 0.6 mg/dL (ref 0.0–1.2)
Total Protein: 6.6 g/dL (ref 6.5–8.1)

## 2023-01-29 LAB — ETHANOL: Alcohol, Ethyl (B): <10 mg/dL (ref <10)

## 2023-01-29 LAB — LIPASE, BLOOD: Lipase: 30 U/L (ref 11–51)

## 2023-01-29 MED ORDER — ACETAMINOPHEN 325 MG PO TABS
650.0000 mg | ORAL_TABLET | Freq: Once | ORAL | Status: AC
  Administered 2023-01-29: 650 mg via ORAL
  Filled 2023-01-29: qty 2

## 2023-01-29 NOTE — ED Notes (Signed)
Per offgoing RN, pt states his son is on the way. Prior RN was unable to reach pt contacts by phone to confirm and states pt does not know additional phone numbers to attempt outreach.

## 2023-01-29 NOTE — ED Provider Notes (Signed)
[place] EMERGENCY DEPARTMENT AT [facility] Provider Note   CSN: [id number] Arrival date & time: 01/29/23  1110     History  Chief Complaint  Patient presents with   Marletta Lor    GIANNA SPRANGER is a 68 y.o. male history of prosthetic below the knee left side, CVA, hypertension, diabetes, right-sided weakness, prostate cancer presented after a fall that occurred earlier today.  Patient dates he was getting off the bus when he missed stepped and landed on his left knee from which she has pain.  Patient does appear intoxicated and was unable to give further history.  Level 5 caveat: Appears Intoxicated  Home Medications Prior to Admission medications   Medication Sig Start Date End Date Taking? Authorizing Provider  acetaminophen (TYLENOL) 500 MG tablet Take 1,000 mg by mouth 3 (three) times daily as needed (pain).    [provider]  alfuzosin (UROXATRAL) 10 MG 24 hr tablet Take 10 mg by mouth daily with breakfast.    [provider]  aspirin EC 81 MG tablet Take 81 mg by mouth daily.    [provider]  atorvastatin (LIPITOR) 40 MG tablet Take 40 mg by mouth at bedtime.    [provider]  Cholecalciferol (VITAMIN D3) 50 MCG (2000 UT) TABS Take 2,000 Units by mouth in the morning.    [provider]  clopidogrel (PLAVIX) 75 MG tablet Take by mouth. 03/12/16   [provider]  donepezil (ARICEPT) 10 MG tablet Take 10 mg by mouth at bedtime.    [provider]  gabapentin (NEURONTIN) 600 MG tablet Take 600 mg by mouth in the morning.    [provider]  gabapentin (NEURONTIN) 800 MG tablet Take 800 mg by mouth at bedtime.    [provider]  lisinopril (ZESTRIL) 20 MG tablet Take 20 mg by mouth daily.    [provider]  loratadine (CLARITIN) 10 MG tablet Take 10 mg by mouth daily.    [provider]  melatonin 5 MG TABS Take 5 mg by mouth at bedtime.    [provider]   metFORMIN (GLUCOPHAGE) 500 MG tablet Take 500 mg by mouth 2 (two) times daily.    [provider]  montelukast (SINGULAIR) 5 MG chewable tablet Chew 5 mg by mouth at bedtime.    [provider]  polyethylene glycol (MIRALAX / GLYCOLAX) 17 g packet Take 17 g by mouth daily. 02/19/21   Mansouraty, Netty Starring., MD  pregabalin (LYRICA) 100 MG capsule Take by mouth. 03/12/16   [provider]  Semaglutide (RYBELSUS) 7 MG TABS Take 7 mg by mouth daily.    [provider]  senna-docusate (SENOKOT-S) 8.6-50 MG tablet Take 2 tablets by mouth daily. 03/12/16   [provider]      Allergies    Patient has no known allergies.    Review of Systems   Review of Systems  Physical Exam Updated Vital Signs BP (!) 141/76   Pulse 85   Temp 98.1 F (36.7 C)   Resp 16   SpO2 98%  Physical Exam Constitutional:      General: He is not in acute distress.    Comments: Vomitus noted around his mouth  HENT:     Head: Normocephalic and atraumatic.     Nose: Nose normal.     Mouth/Throat:     Mouth: Mucous membranes are moist.  Eyes:     Extraocular Movements: Extraocular movements intact.  Conjunctiva/sclera: Conjunctivae normal.     Pupils: Pupils are equal, round, and reactive to light.  Cardiovascular:     Rate and Rhythm: Normal rate and regular rhythm.     Pulses: Normal pulses.     Heart sounds: Normal heart sounds.  Pulmonary:     Effort: Pulmonary effort is normal. No respiratory distress.     Breath sounds: Normal breath sounds.  Abdominal:     Palpations: Abdomen is soft.     Tenderness: There is no abdominal tenderness. There is no guarding or rebound.  Musculoskeletal:        General: Normal range of motion.     Cervical back: Normal range of motion and neck supple. No tenderness.     Comments: Below the knee amputation left side with prosthetic leg however no signs of open wounds, rigid compartments, pain out of proportion, tenderness on  exam  Skin:    General: Skin is warm and dry.     Capillary Refill: Capillary refill takes less than 2 seconds.  Neurological:     General: No focal deficit present.     Mental Status: He is alert.     Comments: Initially had slowed responses and could not give history however this improved throughout his ED stay     ED Results / Procedures / Treatments   Labs (all labs ordered are listed, but only abnormal results are displayed) Labs Reviewed  COMPREHENSIVE METABOLIC PANEL - Abnormal; Notable for the following components:      Result Value   Glucose, Bld 269 (*)    Albumin 3.2 (*)    All other components within normal limits  CBC WITH DIFFERENTIAL/PLATELET - Abnormal; Notable for the following components:   Hemoglobin 12.1 (*)    HCT 38.8 (*)    All other components within normal limits  ETHANOL  LIPASE, BLOOD  URINALYSIS, ROUTINE W REFLEX MICROSCOPIC  RAPID URINE DRUG SCREEN, HOSP PERFORMED    EKG None  Radiology CT Head Wo Contrast Result Date: 01/29/2023 CLINICAL DATA:  Fall.  Pain. EXAM: CT HEAD WITHOUT CONTRAST CT CERVICAL SPINE WITHOUT CONTRAST TECHNIQUE: Multidetector CT imaging of the head and cervical spine was performed following the standard protocol without intravenous contrast. Multiplanar CT image reconstructions of the cervical spine were also generated. RADIATION DOSE REDUCTION: This exam was performed according to the departmental dose-optimization program which includes automated exposure control, adjustment of the mA and/or kV according to patient size and/or use of iterative reconstruction technique. COMPARISON:  Head MRI 05/15/2015.  Head CT 05/14/2015. FINDINGS: CT HEAD FINDINGS Brain: There is no evidence of an acute infarct, intracranial hemorrhage, mass, midline shift, or extra-axial fluid collection. Patchy to confluent hypodensities in the cerebral white matter bilaterally have progressed and are nonspecific but compatible with moderate to severe chronic  small vessel ischemic disease. There are chronic infarcts involving the basal ganglia and deep white matter bilaterally. There is mild cerebral atrophy. Vascular: No hyperdense vessel. Skull: No acute fracture or suspicious osseous lesion. Sinuses/Orbits: Mild mucosal thickening in the paranasal sinuses. Clear mastoid air cells. Unremarkable orbits. Other: None. CT CERVICAL SPINE FINDINGS Alignment: Cervical spine straightening.  No listhesis. Skull base and vertebrae: No acute fracture or suspicious osseous lesion. Soft tissues and spinal canal: No prevertebral fluid or swelling. No visible canal hematoma. Disc levels: Solid C5-6 ACDF. Bulky anterior vertebral osteophytes at C4-5 and C6-7. Broad-based posterior disc osteophyte complex at C3-4 resulting in mild spinal stenosis. Upper chest: Mild biapical lung scarring and calcification.  Other: None. IMPRESSION: 1. No evidence of acute intracranial abnormality or cervical spine fracture. 2. Moderate to severe chronic small vessel ischemic disease. 3. No acute cervical spine fracture. Electronically Signed   By: Sebastian Ache M.D.   On: 01/29/2023 13:22   CT Cervical Spine Wo Contrast Result Date: 01/29/2023 CLINICAL DATA:  Fall.  Pain. EXAM: CT HEAD WITHOUT CONTRAST CT CERVICAL SPINE WITHOUT CONTRAST TECHNIQUE: Multidetector CT imaging of the head and cervical spine was performed following the standard protocol without intravenous contrast. Multiplanar CT image reconstructions of the cervical spine were also generated. RADIATION DOSE REDUCTION: This exam was performed according to the departmental dose-optimization program which includes automated exposure control, adjustment of the mA and/or kV according to patient size and/or use of iterative reconstruction technique. COMPARISON:  Head MRI 05/15/2015.  Head CT 05/14/2015. FINDINGS: CT HEAD FINDINGS Brain: There is no evidence of an acute infarct, intracranial hemorrhage, mass, midline shift, or extra-axial fluid  collection. Patchy to confluent hypodensities in the cerebral white matter bilaterally have progressed and are nonspecific but compatible with moderate to severe chronic small vessel ischemic disease. There are chronic infarcts involving the basal ganglia and deep white matter bilaterally. There is mild cerebral atrophy. Vascular: No hyperdense vessel. Skull: No acute fracture or suspicious osseous lesion. Sinuses/Orbits: Mild mucosal thickening in the paranasal sinuses. Clear mastoid air cells. Unremarkable orbits. Other: None. CT CERVICAL SPINE FINDINGS Alignment: Cervical spine straightening.  No listhesis. Skull base and vertebrae: No acute fracture or suspicious osseous lesion. Soft tissues and spinal canal: No prevertebral fluid or swelling. No visible canal hematoma. Disc levels: Solid C5-6 ACDF. Bulky anterior vertebral osteophytes at C4-5 and C6-7. Broad-based posterior disc osteophyte complex at C3-4 resulting in mild spinal stenosis. Upper chest: Mild biapical lung scarring and calcification. Other: None. IMPRESSION: 1. No evidence of acute intracranial abnormality or cervical spine fracture. 2. Moderate to severe chronic small vessel ischemic disease. 3. No acute cervical spine fracture. Electronically Signed   By: Sebastian Ache M.D.   On: 01/29/2023 13:22   DG Chest 2 View Result Date: 01/29/2023 CLINICAL DATA:  68 year old male status post fall with pain. EXAM: CHEST - 2 VIEW COMPARISON:  Chest radiographs 12/29/2020 and earlier. FINDINGS: AP and lateral views 1228 hours. Lordotic positioning on the AP view today. Stable lung volumes on the lateral. Mediastinal contours are within normal limits. Visualized tracheal air column is within normal limits. No pneumothorax, pulmonary edema, pleural effusion or confluent lung opacity. Osteopenia. Chronic cervical ACDF. No acute osseous abnormality identified. Negative visible bowel gas. IMPRESSION: No acute cardiopulmonary abnormality or acute traumatic  injury identified. Electronically Signed   By: Odessa Fleming M.D.   On: 01/29/2023 13:05   DG Knee Complete 4 Views Left Result Date: 01/29/2023 CLINICAL DATA:  68 year old male status post fall.  Pain. EXAM: LEFT KNEE - COMPLETE 4+ VIEW COMPARISON:  Left femur series 12/21/2021.  No prior knee series. FINDINGS: Distal aspect of chronic left femur intramedullary rod with interlocking screw appears stable and intact. Distal left femur appears intact. Sequelae of below the knee amputation. Residual left tibia and fibula appear intact. Left knee joint alignment maintained and no evidence of knee joint effusion. Advanced calcified peripheral vascular disease. No acute osseous abnormality identified. IMPRESSION: 1. Previous left BKA, left femur ORIF. No acute osseous abnormality identified about the left knee. 2. Advanced calcified peripheral vascular disease. Electronically Signed   By: Odessa Fleming M.D.   On: 01/29/2023 13:03    Procedures Procedures  Medications Ordered in ED Medications  acetaminophen (TYLENOL) tablet 650 mg (has no administration in time range)    ED Course/ Medical Decision Making/ A&P                                 Medical Decision Making Amount and/or Complexity of Data Reviewed Labs: ordered. Radiology: ordered.   Caprice Beaver Szydlowski 68 y.o. presented today for fall. Working DDx that I considered at this time includes, but not limited to, vasovagal episode, mechanical fall, ICH, epidural/subdural hematoma, basilar skull fracture, anemia, electrolyte abnormalities, drug-induced, arrhythmia, UTI, fracture, contusion, soft tissue injury.  R/o DDx: Cannot fully assess as patient wants to leave AMA  Review of prior external notes: 09/30/2022 office visit  Unique Tests and My Interpretation:  CT head without contrast: No acute findings CT cervical spine without contrast: No acute findings Chest x-ray: No acute findings Left knee x-ray: No acute findings EKG: Sinus 80 bpm,  benign early repull noted however no ST elevations or depressions noted that be indicative of ischemia CBC: Unremarkable CMP: Unremarkable Ethanol: Unremarkable Lipase: Unremarkable  Social Determinants of Health: EtOH/Substance Abuse  Discussion with Independent Historian: None  Discussion of Management of Tests: None  Risk: Medium: prescription drug management  Risk Stratification Score: none  Plan: On exam patient was in no acute distress with stable vitals.  On exam patient does appear intoxicated as he is slightly slurring his words and there does appear to be vomitus around his mouth.  Patient story is that he was walking out of a bus when he missed stepped and twisted his knee on the side that has the prostatic.  Patient did not have any midline tenderness and pupils were PERRL bilaterally however patient does appear acutely intoxicated and cannot tell me what he was doing previously to being on the bus.  Abdomen was nontender to palpation and there were no other signs of trauma.  Will get CT head and neck and placement of c-collar until we get imaging.  Patient did not endorsing any neck pain or head pain as this is more of a precautionary measure.  Will obtain labs along with knee x-ray to further evaluate.  Labs and imaging relatively unremarkable.  1 month ago talk to the patient he appeared more alert and was able to to sit and speak with me.  I discussed with the patient possibility of getting an MRI as patient does have significant neurologic history however patient does not feel he needs an MRI would like to be discharged after given pain meds for his falls he states that this was a mechanical fall.  Patient wants to leave against medical advice. Patient understands that his/her actions will lead to inadequate medical workup, and that he/she is at risk of complications of missed diagnosis, which includes morbidity and mortality.  Alternative options discussed  Opportunity to  change mind given  Discussion witnessed by  Patient is demonstrating good capacity to make decision. Patient understands that he/she needs to return to the ER immediately if his/her symptoms get worse.  This chart was dictated using voice recognition software.  Despite best efforts to proofread,  errors can occur which can change the documentation meaning.         Final Clinical Impression(s) / ED Diagnoses Final diagnoses:  Fall, initial encounter    Rx / DC Orders ED Discharge Orders     None  Netta Corrigan, PA-C 01/29/23 1413    Rolan Bucco, MD 01/29/23 [phone]

## 2023-01-29 NOTE — ED Notes (Signed)
Received updated phone number from pt for son Jill Alexanders. Called son at [phone]; son states he is at work but will try to make arrangements to pick up pt asap.

## 2023-01-29 NOTE — ED Triage Notes (Signed)
Pt arrives via EMS from home, pt was attempting to walk without prosthetic leg, fell on left stump. Now c/o continued pain. VSS in transport. Pt alert to baseline. Pain 2/10.

## 2023-01-29 NOTE — Discharge Instructions (Signed)
Please follow-up with your primary care provider regards to recent ER visit.  Today your labs imaging were unremarkable and at your request you are being discharged.  Please take Tylenol every 6 hours needed for pain associated with please return to the ER.

## 2023-09-24 ENCOUNTER — Other Ambulatory Visit (HOSPITAL_COMMUNITY): Payer: Self-pay | Admitting: Internal Medicine

## 2023-09-24 DIAGNOSIS — R1310 Dysphagia, unspecified: Secondary | ICD-10-CM

## 2023-09-24 DIAGNOSIS — R059 Cough, unspecified: Secondary | ICD-10-CM

## 2023-09-26 ENCOUNTER — Ambulatory Visit (HOSPITAL_COMMUNITY)
Admission: RE | Admit: 2023-09-26 | Discharge: 2023-09-26 | Disposition: A | Discharge status: Home / Self Care | Payer: Medicare (Managed Care) | Source: Ambulatory Visit | Attending: Internal Medicine | Admitting: Internal Medicine

## 2023-09-26 DIAGNOSIS — R1312 Dysphagia, oropharyngeal phase: Secondary | ICD-10-CM | POA: Insufficient documentation

## 2023-09-26 DIAGNOSIS — R1314 Dysphagia, pharyngoesophageal phase: Secondary | ICD-10-CM | POA: Insufficient documentation

## 2023-09-26 DIAGNOSIS — R059 Cough, unspecified: Secondary | ICD-10-CM | POA: Insufficient documentation

## 2023-09-26 DIAGNOSIS — R1310 Dysphagia, unspecified: Secondary | ICD-10-CM

## 2023-09-26 NOTE — Progress Notes (Signed)
 Modified Barium Swallow Study  Patient Details  Name: Billy Torres MRN: [id number] Date of Birth: 07/30/1955  Today's Date: 09/26/2023  Modified Barium Swallow completed.  Full report located under Chart Review in the Imaging Section.  History of Present Illness Pt seen for oupatient MBSS due to concerns for coughing with PO intake noted by his family and his primary SLP from [facility]. Pt has a hx of a L MCA CVA.   Clinical Impression  Per MBSS completed today, pt exhibits a mild oropharyngeal dysphagia and a suspected primary esophageal dysphagia. Recommend follow up with GI for further esophageal assessment and intervention as indicated. There was transient, shallow penetration (PAS-2) of thin liquids intermittently during the study. Depth and amount of penetration appeared to increase with straw use. Defer to primary SLP if straws need to be avoided based on clinical assessment. No aspiration observed across trials.   Oral deficits characterized by reduced oral strength and coordination resulting in posterior spillage of all trials to the level of the pyriform sinuses except for pudding consistency. Increased oral prep and transit of regular solid noted with piecemeal deglutition of this trial.   Pharyngeal deficits characterized by reduced hyolaryngeal elevation/excursion, reduced base of tongue retraction, incomplete laryngeal vestibule closure, reduced pharyngeal stripping and reduced distention of UES. Concerns for esophageal dysphagia may be impacting pharyngeal deficits observed today.   Concerns for esophageal dysphagia included reduced motility and distention of liquids and solids in the proximal and distal esophagus with retrograde flow below the UES. Barium tablet given with thin liquids did pass through to distal esophagus without issue. Unable to confirm that tablet passed into stomach due to limited AP view. Esophageal rentention of liquids and solids does elevate pt's risk of  postprandial aspiration. Recommend adhering to reflux precautions.   Anatomical abnormality: -C6 osteophyte observed, which may have contributed to pharyngeal residue in the pyriform sinuses, though did not appear to significantly impact bolus flow.  -Cervical hardware noted.   SLP reviewed findings and recommendations with pt and primary SLP, Billy Torres. Recommend ongoing follow up with SLP and GI consult.    Factors that may increase risk of adverse event in presence of aspiration Billy Torres & Billy Torres 2021): Limited mobility  Swallow Evaluation Recommendations Recommendations: PO diet PO Diet Recommendation: Dysphagia 3 (Mechanical soft);Thin liquids (Level 0) Liquid Administration via: Cup;Straw (try avoiding straws if clinically they appear to be a concern) Medication Administration: Whole meds with liquid Supervision: Intermittent supervision/cueing for swallowing strategies;Set-up assistance for safety Swallowing strategies  : Minimize environmental distractions;Slow rate;Small bites/sips;Follow solids with liquids;Multiple dry swallows after each bite/sip Postural changes: Stay upright 30-60 min after meals;Position pt fully upright for meals Oral care recommendations: Oral care BID (2x/day) Recommended consults: Consider GI consultation;Consider esophageal assessment      Billy Torres 09/26/2023,12:51 PM

## 2023-10-02 ENCOUNTER — Ambulatory Visit: Payer: Medicare (Managed Care)

## 2023-10-03 ENCOUNTER — Ambulatory Visit (INDEPENDENT_AMBULATORY_CARE_PROVIDER_SITE_OTHER): Payer: Medicare (Managed Care) | Admitting: Podiatry

## 2023-10-03 DIAGNOSIS — L988 Other specified disorders of the skin and subcutaneous tissue: Secondary | ICD-10-CM

## 2023-10-03 DIAGNOSIS — B999 Unspecified infectious disease: Secondary | ICD-10-CM

## 2023-10-03 NOTE — Progress Notes (Signed)
 Subjective:  Patient ID: Billy Torres, male    DOB: [birth year],  MRN: [id number]  Chief Complaint  Patient presents with   Diabetes    Nail trim    68 y.o. male presents with the above complaint.  Right 1 through fourth interspace interdigital maceration.  There is no breakdown of skin noted.  Patient wanted to get it evaluated no pain scale.  He has a history of BKA on the left side.  Denies any other acute issues.  0 out of 10 pain scale.  Neuropathy secondary to diabetes.   Review of Systems: Negative except as noted in the HPI. Denies N/V/F/Ch.  Past Medical History:  Diagnosis Date   Abnormal rate of speech    post stroke   Arthritis of left shoulder region    Chronic constipation    Chronic kidney disease    chronic kidney disease    Cognitive deficit due to old cerebral infarction    DDD (degenerative disc disease), cervical    Degenerative cervical spinal stenosis    Gait disturbance, post-stroke    uses cane/weelchair   History of ischemic left MCA stroke 06/27/2013   w/ evidence hemorrhagia, high grade stenosis bilateral proximal MCA and extensive small matter disease--- symptoms resolved, no residual    History of ischemic stroke without residual deficits 05-14-2015---  residual left hemiparesis and cognitive deficits   small nonhemorrhegic infarct right corona radiata posterior limb and progressive right m2 stenosis   Hyperlipidemia    Hypertension    Insomnia    Internal hemorrhoids    Left hemiparesis (HCC)    RESIDUAL FROM STROKE--  WALKS W/ CANE   Lower GI bleed    Mild atherosclerosis of carotid artery, right    RICA 0-39% per duplex 06-28-2013   Neuromuscular disorder (HCC)    neuropathy - feet   OA (osteoarthritis)    LEFT SHOULDER   Painful bladder spasm    Peripheral arterial disease    Prostate cancer [facility]) urologist-  dr tannenbaum/  oncologist-  dr patrcia   dx 12/ 2017-- Stage T1c, Gleason 3+4,  PSA 10.4,  vol 51cc   Sensorineural hearing  loss, bilateral    does not wear hearing aids   Stroke [facility])    Type 2 diabetes mellitus treated with insulin  (HCC)    Urge incontinence    w frequency, wears depends   Urinary retention    Previously required indwelling Foley   Wears dentures     Current Outpatient Medications:    acetaminophen  (TYLENOL ) 500 MG tablet, Take 1,000 mg by mouth 3 (three) times daily as needed (pain)., Disp: , Rfl:    alfuzosin  (UROXATRAL ) 10 MG 24 hr tablet, Take 10 mg by mouth daily with breakfast., Disp: , Rfl:    aspirin  EC 81 MG tablet, Take 81 mg by mouth daily., Disp: , Rfl:    atorvastatin  (LIPITOR ) 40 MG tablet, Take 40 mg by mouth at bedtime., Disp: , Rfl:    Cholecalciferol (VITAMIN D3) 50 MCG (2000 UT) TABS, Take 2,000 Units by mouth in the morning., Disp: , Rfl:    clopidogrel  (PLAVIX ) 75 MG tablet, Take by mouth., Disp: , Rfl:    donepezil  (ARICEPT ) 10 MG tablet, Take 10 mg by mouth at bedtime., Disp: , Rfl:    gabapentin  (NEURONTIN ) 600 MG tablet, Take 600 mg by mouth in the morning., Disp: , Rfl:    gabapentin  (NEURONTIN ) 800 MG tablet, Take 800 mg by mouth at bedtime., Disp: , Rfl:  lisinopril  (ZESTRIL ) 20 MG tablet, Take 20 mg by mouth daily., Disp: , Rfl:    loratadine  (CLARITIN ) 10 MG tablet, Take 10 mg by mouth daily., Disp: , Rfl:    melatonin 5 MG TABS, Take 5 mg by mouth at bedtime., Disp: , Rfl:    metFORMIN  (GLUCOPHAGE ) 500 MG tablet, Take 500 mg by mouth 2 (two) times daily., Disp: , Rfl:    montelukast  (SINGULAIR ) 5 MG chewable tablet, Chew 5 mg by mouth at bedtime., Disp: , Rfl:    polyethylene glycol (MIRALAX  / GLYCOLAX ) 17 g packet, Take 17 g by mouth daily., Disp: 14 each, Rfl: 0   pregabalin  (LYRICA ) 100 MG capsule, Take by mouth., Disp: , Rfl:    Semaglutide (RYBELSUS) 7 MG TABS, Take 7 mg by mouth daily., Disp: , Rfl:    senna-docusate (SENOKOT-S) 8.6-50 MG tablet, Take 2 tablets by mouth daily., Disp: , Rfl:   Social History   Tobacco Use  Smoking Status Former    Current packs/day: 0.25   Average packs/day: 0.3 packs/day for 45.0 years (11.3 ttl pk-yrs)   Types: Cigarettes  Smokeless Tobacco Never    No Known Allergies Objective:  There were no vitals filed for this visit. There is no height or weight on file to calculate BMI. Constitutional Well developed. Well nourished.  Vascular Dorsalis pedis pulses palpable bilaterally. Posterior tibial pulses palpable bilaterally. Capillary refill normal to all digits.  No cyanosis or clubbing noted. Pedal hair growth normal.  Neurologic Normal speech. Oriented to person, place, and time. Epicritic sensation to light touch grossly present bilaterally.  Dermatologic Right 1st through 4th interspace interdigital maceration without breakdown of skin.  Some skin epidermolysis noted likely due to fungal infection.  No erythema noted no purulent drainage noted no malodor present  Orthopedic: Normal joint ROM without pain or crepitus bilaterally. No visible deformities. No bony tenderness.   Radiographs: None Assessment:   1. Superinfection   2. Skin maceration    Plan:  Patient was evaluated and treated and all questions answered.  Right interdigital 1st through 4th interspace superinfection - All questions and concerns were discussed with the patient in extensive detail I encouraged patient to keep it dry in between the toes.  There is no breakdown of skin no ulceration noted at this time.  I encouraged him to do Betadine  wet-to-dry dressing changes daily in between the toes which will help dry up the tissue before he progressively worsens and breaks down.  Patient is in agreement with the plan.  Orders were written on the discharge paper.   No follow-ups on file.

## 2023-11-07 ENCOUNTER — Other Ambulatory Visit: Payer: Medicare (Managed Care)

## 2023-11-07 ENCOUNTER — Encounter: Payer: Self-pay | Admitting: Gastroenterology

## 2023-11-07 ENCOUNTER — Ambulatory Visit: Payer: Self-pay | Admitting: Gastroenterology

## 2023-11-07 ENCOUNTER — Ambulatory Visit: Payer: Medicare (Managed Care) | Admitting: Gastroenterology

## 2023-11-07 VITALS — BP 128/60 | HR 83 | Ht 67.0 in | Wt 163.4 lb

## 2023-11-07 DIAGNOSIS — D649 Anemia, unspecified: Secondary | ICD-10-CM

## 2023-11-07 DIAGNOSIS — Z860100 Personal history of colon polyps, unspecified: Secondary | ICD-10-CM | POA: Diagnosis not present

## 2023-11-07 DIAGNOSIS — R1319 Other dysphagia: Secondary | ICD-10-CM | POA: Diagnosis not present

## 2023-11-07 DIAGNOSIS — Z8619 Personal history of other infectious and parasitic diseases: Secondary | ICD-10-CM | POA: Diagnosis not present

## 2023-11-07 LAB — CBC WITH DIFFERENTIAL/PLATELET
Basophils Absolute: 0 K/uL (ref 0.0–0.1)
Basophils Relative: 0.3 % (ref 0.0–3.0)
Eosinophils Absolute: 0.1 K/uL (ref 0.0–0.7)
Eosinophils Relative: 1.3 % (ref 0.0–5.0)
HCT: 41 % (ref 39.0–52.0)
Hemoglobin: 13.2 g/dL (ref 13.0–17.0)
Lymphocytes Relative: 14.8 % (ref 12.0–46.0)
Lymphs Abs: 1.1 K/uL (ref 0.7–4.0)
MCHC: 32.1 g/dL (ref 30.0–36.0)
MCV: 87.7 fl (ref 78.0–100.0)
Monocytes Absolute: 0.5 K/uL (ref 0.1–1.0)
Monocytes Relative: 6.4 % (ref 3.0–12.0)
Neutro Abs: 5.8 K/uL (ref 1.4–7.7)
Neutrophils Relative %: 77.2 % — ABNORMAL HIGH (ref 43.0–77.0)
Platelets: 251 K/uL (ref 150.0–400.0)
RBC: 4.68 Mil/uL (ref 4.22–5.81)
RDW: 13 % (ref 11.5–15.5)
WBC: 7.5 K/uL (ref 4.0–10.5)

## 2023-11-07 LAB — IBC + FERRITIN
Ferritin: 35.7 ng/mL (ref 22.0–322.0)
Iron: 81 ug/dL (ref 42–165)
Saturation Ratios: 26.3 % (ref 20.0–50.0)
TIBC: 308 ug/dL (ref 250.0–450.0)
Transferrin: 220 mg/dL (ref 212.0–360.0)

## 2023-11-07 LAB — B12 AND FOLATE PANEL
Folate: 18.8 ng/mL (ref >5.9)
Vitamin B-12: 329 pg/mL (ref 211–911)

## 2023-11-07 LAB — BASIC METABOLIC PANEL WITH GFR
BUN: 21 mg/dL (ref 6–23)
CO2: 26 meq/L (ref 19–32)
Calcium: 9.2 mg/dL (ref 8.4–10.5)
Chloride: 105 meq/L (ref 96–112)
Creatinine, Ser: 1.08 mg/dL (ref 0.40–1.50)
GFR: 69.99 mL/min (ref >60.00)
Glucose, Bld: 185 mg/dL — ABNORMAL HIGH (ref 70–99)
Potassium: 4.5 meq/L (ref 3.5–5.1)
Sodium: 140 meq/L (ref 135–145)

## 2023-11-07 MED ORDER — NA SULFATE-K SULFATE-MG SULF 17.5-3.13-1.6 GM/177ML PO SOLN: 1.0000 | Freq: Once | ORAL | 0 refills | Status: AC

## 2023-11-07 NOTE — Patient Instructions (Addendum)
 You have been scheduled for an endoscopy and colonoscopy. Please follow the written instructions given to you at your visit today.  If you use inhalers (even only as needed), please bring them with you on the day of your procedure.  DO NOT TAKE 7 DAYS PRIOR TO TEST- Trulicity (dulaglutide) Ozempic, Wegovy (semaglutide) Mounjaro (tirzepatide) Bydureon Bcise (exanatide extended release)  DO NOT TAKE 1 DAY PRIOR TO YOUR TEST Rybelsus (semaglutide) Adlyxin (lixisenatide) Victoza (liraglutide) Byetta (exanatide) ___________________________________________________________________________   Your provider has requested that you go to the basement level for lab work before leaving today. Press B on the elevator. The lab is located at the first door on the left as you exit the elevator.   You will be contacted by our office prior to your procedure for directions on holding your Plavix .  If you do not hear from our office 1 week prior to your scheduled procedure, please call [phone] to discuss.

## 2023-11-07 NOTE — Progress Notes (Signed)
 Billy Torres [id number] August 09, 1955   Chief Complaint: Trouble swallowing, discuss colonoscopy  Referring Provider: Cloria Annabella CROME, DO Primary GI MD: Dr. Wilhelmenia  HPI: Billy Torres is a 68 y.o. male with past medical history of chronic constipation, CKD, CVA with residual cognitive deficit, HLD, HTN, hemorrhoids, prostate cancer, T2DM, neuromuscular disorder, prior H. pylori infection, colon polyps who presents today to discuss colonoscopy and for complaint of dysphagia.    Last seen in office 12/08/2020 by Dr. Wilhelmenia.  Patient underwent colonoscopy in 2021 with multiple polyps removed via EMR.  Patient had been scheduled for 2 follow-up colonoscopies and did not show up to either.  Plan at that time was to schedule colonoscopy with EMR for follow-up of large polyp resections, EGD for H. pylori follow-up and unintentional weight loss with consideration for CT abdomen and pelvis if weight loss persisted.  Underwent hospital-based EGD/colonoscopy 02/19/2021.  Piecemeal EMR and avulsion performed during colonoscopy.  Two 4 to 6 mm polyps in the rectum and ascending colon were removed with cold snare.  Recall recommended in 6 to 9 months.  On EGD found to have gastritis but negative for H. Pylori, eradicated.  Recent modified barium swallow evaluation - mild oropharyngeal dysphagia, suspected primary esophageal dysphagia. No aspiration observed  Referred by [facility].    Discussed the use of AI scribe software for clinical note transcription with the patient, who gave verbal consent to proceed.  History of Present Illness Billy Torres is a 68 year old male with polyps and esophageal dysmotility who presents for a follow-up colonoscopy and evaluation of swallowing difficulties. Here with representative from [facility].  He is here for a follow-up colonoscopy due to a history of polyps. His last colonoscopy in 2023 involved the removal of polyps.  He has  been experiencing difficulty swallowing for six months to a year. A swallow evaluation showed no aspiration, but there was concern for possible esophageal dysmotility. He experiences intermittent episodes where food feels stuck in his throat or chest, but it eventually goes down without regurgitation. No nausea, vomiting, abdominal pain, unintentional weight loss, or changes in appetite.  He has a history of anemia for a couple of years. He is on Plavix  and aspirin  due to a history of stroke. No recent heart attack or stroke. He has been using a wheelchair for about four months, having previously been able to walk. He had a left leg amputation approximately five years ago.  No chest pain, shortness of breath, reflux, or heartburn. He has regular bowel movements once a day without blood or dark stools.   Previous GI Procedures/Imaging   CT A/P 07/05/2022 1. Nonobstructive left renal calculus. No obstructive uropathy bilaterally. 2. Bladder calculus on the left. 3. Mildly enlarged prostate with brachytherapy seeds. 4. Moderate amount of retained stool in the colon, compatible with history of constipation. 5. Trace pericardial effusion and multi-vessel coronary artery calcifications. 6. Aortic atherosclerosis.  Colonoscopy 02/19/2021 - Hemorrhoids found on digital rectal exam.  - Stool in the entire examined colon - lavaged with adequate visualization.  - Post mucosectomy scar in the cecum. Polypoid recurrence present and it touches the lip of the cecal side of the ICV. Repeat mucosectomy needed today. Piecemeal EMR and Avulsion performed. STSC to the margin performed. Clips ( MR conditional) were placed.  - Two 4 to 6 mm polyps in the rectum and in the ascending colon, removed with a cold snare. Resected and retrieved.  -  Normal mucosa in the entire examined colon otherwise. Biopsied.  - Non- bleeding non- thrombosed external and internal hemorrhoids. - Recall 6-9 months  EGD 02/19/2021 - No  gross lesions in esophagus. Z- line regular, 40 cm from the incisors.  - Gastritis. Biopsied.  - No gross lesions in the duodenal bulb, in the first portion of the duodenum and in the second portion of the duodenum. Biopsied.  Path: A. STOMACH, BIOPSY: - Gastric oxyntic mucosa with no specific histopathologic changes - Helicobacter pylori-like organisms are not identified on routine HE stain  B. DUODENUM, BIOPSY: - Duodenal mucosa with no specific histopathologic changes - Negative for increased intraepithelial lymphocytes or villous architectural changes  C. CECUM, EMR, POLYPECTOMY: - Tubular adenoma(s) with high-grade dysplasia - No definite evidence of invasive carcinoma  D. COLON, ASCENDING, POLYPECTOMY: - Tubular adenoma(s) - Negative for high-grade dysplasia or malignancy  E. COLON, RANDOM, BIOPSY: - Colonic mucosa with no specific histopathologic changes - Negative for acute inflammation, increased intraepithelial lymphocytes or thickened subepithelial collagen table   December 2021 colonoscopy - Hemorrhoids found on digital rectal exam. - Stool in the entire examined colon. Lavaged with adequate visualization. - The examined portion of the ileum was normal. - One 45 mm polyp in the cecum encroaching towards the ICV but not entering, removed with piecemeal cold mucosal resection. Resected and retrieved. Clips (MR conditional) were placed. - Five 3 to 9 mm polyps in the descending colon, in the transverse colon, in the ascending colon and in the cecum, removed with a cold snare. Resected and retrieved. - One 15 mm polyp in the distal rectum, removed with mucosal resection. Resected and retrieved. Clips (MR conditional) were placed. - Diverticulosis in the recto-sigmoid colon and in the sigmoid colon.  December 2021 EGD - No gross lesions in esophagus. Z-line irregular, 40 cm from the incisors. - Gastritis. No other gross lesions in the stomach. Biopsied. - No gross  lesions in the duodenal bulb, in the first portion of the duodenum and in the second portion of the duodenum. Biopsied.   Pathology FINAL MICROSCOPIC DIAGNOSIS:  A. DUODENUM, BIOPSY:  - Mild increase in intraepithelial lymphocytes, see comment.  - No dysplasia or malignancy.  B. STOMACH, BIOPSY:  - Chronic active gastritis with Helicobacter pylori.  - Warthin-Starry is positive for Helicobacter pylori.  - No intestinal metaplasia, dysplasia, or malignancy.  C. COLON POLYP, CECAL, EMR:  - Multiple fragments of tubulovillous adenoma with high grade dysplasia.  D. COLON, MULTIPLE, POLYPECTOMY:  - Multiple fragments of tubular adenoma and hyperplastic polyp.  - No high grade dysplasia or malignancy.  E. RECTAL POLYP, EMR:  - Multiple fragments of tubulovillous adenoma.  - No high grade dysplasia or malignancy.  COMMENT:  A. There are increased intraepithelial lymphocytes, but no villous  blunting. These findings are likely related to the H. pylori infection  seen in part B.    Past Medical History:  Diagnosis Date   Abnormal rate of speech    post stroke   Arthritis of left shoulder region    Chronic constipation    Chronic kidney disease    chronic kidney disease    Cognitive deficit due to old cerebral infarction    DDD (degenerative disc disease), cervical    Degenerative cervical spinal stenosis    Gait disturbance, post-stroke    uses cane/weelchair   History of ischemic left MCA stroke 06/27/2013   w/ evidence hemorrhagia, high grade stenosis bilateral proximal MCA and extensive small matter disease--- symptoms  resolved, no residual    History of ischemic stroke without residual deficits 05-14-2015---  residual left hemiparesis and cognitive deficits   small nonhemorrhegic infarct right corona radiata posterior limb and progressive right m2 stenosis   Hyperlipidemia    Hypertension    Insomnia    Internal hemorrhoids    Left hemiparesis (HCC)    RESIDUAL FROM  STROKE--  WALKS W/ CANE   Lower GI bleed    Mild atherosclerosis of carotid artery, right    RICA 0-39% per duplex 06-28-2013   Neuromuscular disorder (HCC)    neuropathy - feet   OA (osteoarthritis)    LEFT SHOULDER   Painful bladder spasm    Peripheral arterial disease    Prostate cancer [facility]) urologist-  dr jannette  oncologist-  dr patrcia   dx 12/ 2017-- Stage T1c, Gleason 3+4,  PSA 10.4,  vol 51cc   Sensorineural hearing loss, bilateral    does not wear hearing aids   Stroke [facility])    Type 2 diabetes mellitus treated with insulin  (HCC)    Urge incontinence    w frequency, wears depends   Urinary retention    Previously required indwelling Foley   Wears dentures     Past Surgical History:  Procedure Laterality Date   ABDOMINAL AORTOGRAM W/LOWER EXTREMITY N/A 12/11/2018   Procedure: ABDOMINAL AORTOGRAM W/LOWER EXTREMITY;  Surgeon: Eliza Lonni RAMAN, MD;  Location: [facility] INVASIVE CV LAB;  Service: Cardiovascular;  Laterality: N/A;   ABDOMINAL AORTOGRAM W/LOWER EXTREMITY Bilateral 04/02/2019   Procedure: ABDOMINAL AORTOGRAM W/LOWER EXTREMITY;  Surgeon: Eliza Lonni RAMAN, MD;  Location: [facility] INVASIVE CV LAB;  Service: Cardiovascular;  Laterality: Bilateral;   AMPUTATION Left 04/28/2019   Procedure: LEFT BELOW KNEE AMPUTATION;  Surgeon: Harden Jerona GAILS, MD;  Location: [facility] OR;  Service: Orthopedics;  Laterality: Left;   ANTERIOR CERVICAL DECOMP/DISCECTOMY FUSION  11/27/1999   C5 -- C6   BELOW KNEE LEG AMPUTATION Left 04/28/2019   BIOPSY  05/21/2019   Procedure: BIOPSY;  Surgeon: Legrand Victory LITTIE DOUGLAS, MD;  Location: [facility] ENDOSCOPY;  Service: Gastroenterology;;   BIOPSY  12/20/2019   Procedure: BIOPSY;  Surgeon: Wilhelmenia Aloha Raddle., MD;  Location[facility] ENDOSCOPY;  Service: Gastroenterology;;   BIOPSY  02/19/2021   Procedure: BIOPSY;  Surgeon: Wilhelmenia Aloha Raddle., MD;  Location: [facility] ENDOSCOPY;  Service: Gastroenterology;;   CIRCUMCISION  10/07/2001   COLONOSCOPY WITH PROPOFOL   N/A 05/21/2019   Procedure: COLONOSCOPY WITH PROPOFOL ;  Surgeon: Legrand Victory LITTIE DOUGLAS, MD;  Location: [facility] ENDOSCOPY;  Service: Gastroenterology;  Laterality: N/A;   COLONOSCOPY WITH PROPOFOL  N/A 12/20/2019   Procedure: COLONOSCOPY WITH PROPOFOL ;  Surgeon: Mansouraty, Aloha Raddle., MD;  Location: [facility] ENDOSCOPY;  Service: Gastroenterology;  Laterality: N/A;   COLONOSCOPY WITH PROPOFOL  N/A 02/19/2021   Procedure: COLONOSCOPY WITH PROPOFOL ;  Surgeon: Mansouraty, Aloha Raddle., MD;  Location: [facility] ENDOSCOPY;  Service: Gastroenterology;  Laterality: N/A;   CYSTOSCOPY WITH INSERTION OF UROLIFT N/A 08/11/2017   Procedure: CYSTOSCOPY WITH INSERTION OF UROLIFT;  Surgeon: Sherrilee Belvie LITTIE, MD;  Location: [facility] ORS;  Service: Urology;  Laterality: N/A;   ENDOSCOPIC MUCOSAL RESECTION N/A 12/20/2019   Procedure: ENDOSCOPIC MUCOSAL RESECTION;  Surgeon: Wilhelmenia Aloha Raddle., MD;  Location: [facility] ENDOSCOPY;  Service: Gastroenterology;  Laterality: N/A;   ENDOSCOPIC MUCOSAL RESECTION N/A 02/19/2021   Procedure: ENDOSCOPIC MUCOSAL RESECTION;  Surgeon: Wilhelmenia Aloha Raddle., MD;  Location: [facility] ENDOSCOPY;  Service: Gastroenterology;  Laterality: N/A;   ESOPHAGOGASTRODUODENOSCOPY N/A 02/19/2021   Procedure: ESOPHAGOGASTRODUODENOSCOPY (EGD);  Surgeon: Wilhelmenia Aloha  Mickey., MD;  Location: [facility] ENDOSCOPY;  Service: Gastroenterology;  Laterality: N/A;   ESOPHAGOGASTRODUODENOSCOPY (EGD) WITH PROPOFOL  N/A 12/20/2019   Procedure: ESOPHAGOGASTRODUODENOSCOPY (EGD) WITH PROPOFOL ;  Surgeon: Wilhelmenia Aloha Mickey., MD;  Location: [facility] ENDOSCOPY;  Service: Gastroenterology;  Laterality: N/A;   FLEXIBLE SIGMOIDOSCOPY N/A 05/23/2019   Procedure: FLEXIBLE SIGMOIDOSCOPY;  Surgeon: Legrand Victory LITTIE DOUGLAS, MD;  Location: [facility] ENDOSCOPY;  Service: Gastroenterology;  Laterality: N/A;   FLEXIBLE SIGMOIDOSCOPY N/A 05/26/2019   Procedure: FLEXIBLE SIGMOIDOSCOPY;  Surgeon: Teressa Toribio SQUIBB, MD;  Location: [facility] ENDOSCOPY;  Service: Endoscopy;  Laterality: N/A;   FOOT  SURGERY  2005   HEMOSTASIS CLIP PLACEMENT  05/23/2019   Procedure: HEMOSTASIS CLIP PLACEMENT;  Surgeon: Legrand Victory LITTIE DOUGLAS, MD;  Location: [facility] ENDOSCOPY;  Service: Gastroenterology;;   HEMOSTASIS CLIP PLACEMENT  05/26/2019   Procedure: HEMOSTASIS CLIP PLACEMENT;  Surgeon: Teressa Toribio SQUIBB, MD;  Location: [facility] ENDOSCOPY;  Service: Endoscopy;;   HEMOSTASIS CLIP PLACEMENT  12/20/2019   Procedure: HEMOSTASIS CLIP PLACEMENT;  Surgeon: Wilhelmenia Aloha Mickey., MD;  Location: [facility] ENDOSCOPY;  Service: Gastroenterology;;   HEMOSTASIS CLIP PLACEMENT  02/19/2021   Procedure: HEMOSTASIS CLIP PLACEMENT;  Surgeon: Wilhelmenia Aloha Mickey., MD;  Location: [facility] ENDOSCOPY;  Service: Gastroenterology;;   INSERTION OF TRACTION PIN Left 10/27/2021   Procedure: INSERTION AND REMOVAL OF TRACTION PIN;  Surgeon: Addie Cordella Hamilton, MD;  Location: [facility] OR;  Service: Orthopedics;  Laterality: Left;   INTRAMEDULLARY (IM) NAIL INTERTROCHANTERIC Left 10/27/2021   Procedure: INTRAMEDULLARY (IM) NAIL INTERTROCHANTERIC;  Surgeon: Addie Cordella Hamilton, MD;  Location: [facility] OR;  Service: Orthopedics;  Laterality: Left;   PERIPHERAL VASCULAR BALLOON ANGIOPLASTY  12/11/2018   Procedure: PERIPHERAL VASCULAR BALLOON ANGIOPLASTY;  Surgeon: Eliza Lonni RAMAN, MD;  Location: [facility] INVASIVE CV LAB;  Service: Cardiovascular;;  Lt. SFA   POLYPECTOMY  12/20/2019   Procedure: POLYPECTOMY;  Surgeon: Wilhelmenia Aloha Mickey., MD;  Location: [facility] ENDOSCOPY;  Service: Gastroenterology;;   POLYPECTOMY  02/19/2021   Procedure: POLYPECTOMY;  Surgeon: Wilhelmenia Aloha Mickey., MD;  Location: [facility] ENDOSCOPY;  Service: Gastroenterology;;   PROSTATE BIOPSY  12/12/2015   RADIOACTIVE SEED IMPLANT N/A 05/30/2016   Procedure: RADIOACTIVE SEED IMPLANT/BRACHYTHERAPY IMPLANT WITH SPACE OAR;  Surgeon: Chales Idol, MD;  Location: [facility];  Service: Urology;  Laterality: N/A;   SHOULDER ARTHROSCOPY WITH SUBACROMIAL DECOMPRESSION Left 11/28/2006    Debridement/ Acromioplasty/  CA ligament relesase   SUBMUCOSAL LIFTING INJECTION  12/20/2019   Procedure: SUBMUCOSAL LIFTING INJECTION;  Surgeon: Wilhelmenia Aloha Mickey., MD;  Location: [facility] ENDOSCOPY;  Service: Gastroenterology;;   SUBMUCOSAL LIFTING INJECTION  02/19/2021   Procedure: SUBMUCOSAL LIFTING INJECTION;  Surgeon: Wilhelmenia Aloha Mickey., MD;  Location: [facility] ENDOSCOPY;  Service: Gastroenterology;;   TRANSTHORACIC ECHOCARDIOGRAM  05/15/2015   mild LVH,  ef 65-70%, codominant mitral inflow-iindeterminate diastolic function/      Current Outpatient Medications  Medication Sig Dispense Refill   acetaminophen  (TYLENOL ) 500 MG tablet Take 1,000 mg by mouth 3 (three) times daily as needed (pain).     alfuzosin  (UROXATRAL ) 10 MG 24 hr tablet Take 10 mg by mouth daily with breakfast.     aspirin  EC 81 MG tablet Take 81 mg by mouth daily.     atorvastatin  (LIPITOR ) 40 MG tablet Take 40 mg by mouth at bedtime.     Cholecalciferol (VITAMIN D3) 50 MCG (2000 UT) TABS Take 2,000 Units by mouth in the morning.     clopidogrel  (PLAVIX ) 75 MG tablet Take by mouth.     donepezil  (ARICEPT ) 10  MG tablet Take 10 mg by mouth at bedtime.     gabapentin  (NEURONTIN ) 600 MG tablet Take 600 mg by mouth in the morning.     gabapentin  (NEURONTIN ) 800 MG tablet Take 800 mg by mouth at bedtime.     lisinopril  (ZESTRIL ) 20 MG tablet Take 20 mg by mouth daily.     loratadine  (CLARITIN ) 10 MG tablet Take 10 mg by mouth daily.     melatonin 5 MG TABS Take 5 mg by mouth at bedtime.     metFORMIN  (GLUCOPHAGE ) 500 MG tablet Take 500 mg by mouth 2 (two) times daily.     montelukast  (SINGULAIR ) 5 MG chewable tablet Chew 5 mg by mouth at bedtime.     polyethylene glycol (MIRALAX  / GLYCOLAX ) 17 g packet Take 17 g by mouth daily. 14 each 0   pregabalin  (LYRICA ) 100 MG capsule Take by mouth.     Semaglutide (RYBELSUS) 7 MG TABS Take 7 mg by mouth daily.     senna-docusate (SENOKOT-S) 8.6-50 MG tablet Take 2 tablets by mouth  daily.     No current facility-administered medications for this visit.    Allergies as of 11/07/2023   (No Known Allergies)    Family History  Problem Relation Age of Onset   Hypertension Mother    Hypertension Father    Cancer Brother        prostate    Social History   Tobacco Use   Smoking status: Former    Current packs/day: 0.25    Average packs/day: 0.3 packs/day for 45.0 years (11.3 ttl pk-yrs)    Types: Cigarettes   Smokeless tobacco: Never  Vaping Use   Vaping status: Never Used  Substance Use Topics   Alcohol use: No   Drug use: No     Review of Systems:    Constitutional: No weight loss, fever, chills Cardiovascular: No chest pain Respiratory: No SOB  Gastrointestinal: See HPI and otherwise negative   Physical Exam:  Vital signs: BP 128/60   Pulse 83   Ht 5' 7 (1.702 m)   Wt 163 lb 6 oz (74.1 kg)   BMI 25.59 kg/m   Wt Readings from Last 3 Encounters:  11/07/23 163 lb 6 oz (74.1 kg)  12/09/21 154 lb 15.7 oz (70.3 kg)  10/27/21 155 lb (70.3 kg)     Constitutional: Pleasant male in NAD, alert and cooperative, sitting in wheelchair Head:  Normocephalic and atraumatic.  Eyes: No scleral icterus.  Respiratory: Respirations even and unlabored. Lungs clear to auscultation bilaterally.  No wheezes, crackles, or rhonchi.  Cardiovascular:  Regular rate and rhythm. No murmurs. No peripheral edema. Gastrointestinal:  Soft, nondistended, nontender. No rebound or guarding. Normal bowel sounds. No appreciable masses  Rectal:  Not performed.  Neurologic:  Alert and oriented x4;  grossly normal neurologically.  Skin:   Dry and intact without significant lesions or rashes. Psychiatric: Oriented to person, place and time. Demonstrates good judgement and reason without abnormal affect or behaviors.   RELEVANT LABS AND IMAGING: CBC    Component Value Date/Time   WBC 6.2 01/29/2023 1245   RBC 4.24 01/29/2023 1245   HGB 12.1 (L) 01/29/2023 1245   HCT 38.8  (L) 01/29/2023 1245   PLT 201 01/29/2023 1245   MCV 91.5 01/29/2023 1245   MCH 28.5 01/29/2023 1245   MCHC 31.2 01/29/2023 1245   RDW 13.0 01/29/2023 1245   LYMPHSABS 0.8 01/29/2023 1245   MONOABS 0.9 01/29/2023 1245   EOSABS 0.0  01/29/2023 1245   BASOSABS 0.0 01/29/2023 1245    CMP     Component Value Date/Time   NA 139 01/29/2023 1245   K 4.5 01/29/2023 1245   CL 105 01/29/2023 1245   CO2 27 01/29/2023 1245   GLUCOSE 269 (H) 01/29/2023 1245   BUN 17 01/29/2023 1245   CREATININE 1.19 01/29/2023 1245   CREATININE 0.92 08/14/2015 1552   CALCIUM  9.1 01/29/2023 1245   PROT 6.6 01/29/2023 1245   ALBUMIN 3.2 (L) 01/29/2023 1245   AST 17 01/29/2023 1245   ALT 15 01/29/2023 1245   ALKPHOS 66 01/29/2023 1245   BILITOT 0.6 01/29/2023 1245   GFRNONAA >60 01/29/2023 1245   GFRNONAA 89 08/14/2015 1552   GFRAA >60 05/23/2019 0304   GFRAA >89 08/14/2015 1552     Assessment/Plan:   History of colon polyps Esophageal dysphagia Anemia History of H. pylori infection Patient seen today for evaluation of dysphagia and to discuss colonoscopy.  Underwent EGD colonoscopy 02/19/2021 in hospital setting as polyp removal required EMR.  Was advised to have a recall in 6 to 9 months but there has been difficulty with scheduling/transportation.  Patient reports that he has been having trouble swallowing for 6 months to a year.  Had modified barium swallow evaluation which showed mild oropharyngeal dysphagia and suspected esophageal dysphagia with no aspiration observed.  Patient has intermittent episodes of food feeling stuck in his throat or chest but is not having any regurgitation. Has had history of anemia for a couple of years.  On Plavix  and aspirin  for history of stroke.  - Schedule colonoscopy for follow-up of colon polyps, to be done in hospital setting. I thoroughly discussed the procedure with the patient to include nature of the procedure, alternatives, benefits, and risks (including but  not limited to bleeding, infection, perforation, anesthesia/cardiac/pulmonary complications). Patient verbalized understanding and gave verbal consent to proceed with procedure.  - Add EGD with possible dilation for evaluation of esophageal dysphagia - Request Plavix  hold - Consider video capsule endoscopy if persistent anemia - Will update labs today as he has not had repeat labs since January   Camie Furbish, PA-C [facility] 11/07/2023, 10:37 AM  Patient Care Team: Cloria Annabella CROME, DO as PCP - General (Geriatric Medicine)

## 2023-11-09 ENCOUNTER — Encounter: Payer: Self-pay | Admitting: Gastroenterology

## 2023-11-12 ENCOUNTER — Telehealth: Payer: Self-pay

## 2023-11-12 NOTE — Progress Notes (Signed)
 Attending Physician's Attestation   I have reviewed the chart.   I agree with the Advanced Practitioner's note, impression, and recommendations with any updates as below. Overdue for his surveillance, but thankful he is present currently. Please have this scheduled as EGD/Colonoscopy for 75 minute slot. Will need antiPLT hold of Plavix  request.   Aloha Finner, MD [facility] Gastroenterology Advanced Endoscopy Office # [phone]

## 2023-11-12 NOTE — Telephone Encounter (Signed)
 Author: Mansouraty, Aloha Raddle., MD Service: Gastroenterology Author Type: Physician  Filed: 11/12/2023  1:49 PM Encounter Date: 11/07/2023 Status: Signed  Editor: Mansouraty, Aloha Raddle., MD (Physician)    Attending Physician's Attestation    I have reviewed the chart.    I agree with the Advanced Practitioner's note, impression, and recommendations with any updates as below. Overdue for his surveillance, but thankful he is present currently. Please have this scheduled as EGD/Colonoscopy for 75 minute slot. Will need antiPLT hold of Plavix  request.     Aloha Finner, MD [facility] Gastroenterology Advanced Endoscopy Office # [phone]

## 2023-11-14 NOTE — Telephone Encounter (Signed)
 The pt was sent up while in office nothing further needed.

## 2023-12-16 NOTE — Progress Notes (Signed)
 Otolaryngology Clinic Note  HPI:    Cerumen Impaction (Pt presents today for ear cleaning.)   Billy Torres is a 68 y.o. male who presents as a return patient for evaluation and treatment of bilateral ear cleaning he was last seen by myself beginning of October 2025.  Patient gets excess cerumen in both ears and has known bilateral sensorineural hearing loss his last audiogram on record was December 2021.  H will e is a bilateral hearing aid wearer, Phonak Audeo B50-R hearing aid coupled to an encased c-shell receiver.  Patient is overdue for updated audiogram.  Patient also has known tinnitus which she has had for several years or more.  Patient denies any acute ear complaints such as ear pain ear drainage or changes in his baseline hearing.  PMH/Meds/All/SocHx/FamHx/ROS:   Medical History[1]  Surgical History[2]  No family history of bleeding disorders, wound healing problems or difficulty with anesthesia.   Social History   Socioeconomic History   Marital status: Divorced    Spouse name: Not on file   Number of children: Not on file   Years of education: Not on file   Highest education level: Not on file  Occupational History   Not on file  Tobacco Use   Smoking status: Former    Types: Cigarettes   Smokeless tobacco: Never  Vaping Use   Vaping status: Never Used  Substance and Sexual Activity   Alcohol use: Not Currently   Drug use: Never   Sexual activity: Not on file  Other Topics Concern   Not on file  Social History Narrative   Not on file   Social Drivers of Health   Living Situation: Not on file  Food Insecurity: Not on file  Transportation Needs: Not on file  Utilities: Not on file  Safety: Not on file  Alcohol Screening: Not on file  Tobacco Use: Medium Risk (12/16/2023)   Patient History    Smoking Tobacco Use: Former    Smokeless Tobacco Use: Never    Passive Exposure: Not on file  Depression: Not At Risk (12/16/2023)   PHQ-2     PHQ-2 Score: 0  Social Connections: Not on file  Financial Resource Strain: Not on file    Current Medications[3]  A complete ROS was performed with pertinent positives/negatives noted in the HPI. The remainder of the ROS are negative.    Physical Exam:    BP 126/74 (BP Location: Left arm, Patient Position: Sitting)   Pulse 91   Temp 97.9 F (36.6 C) (Temporal)   Ht 1.727 m (5' 8)   Wt 74.8 kg (165 lb)   BMI 25.09 kg/m    General: Well developed, well nourished. No acute distress. Voice normal.  Head/Face: Normocephalic, atraumatic. No scars or lesions. No sinus tenderness. Facial nerve intact and equal bilaterally. No facial lacerations. TMJ nontender to palpation.  Eyes: Pupils are equal, round and reactive to light. Conjunctiva and lids are normal. Normal extraocular mobility.   Ears:   Right:  Pinna and external meatus normal, normal ear canal skin and caliber without drainage. Impacted cerumen, cleared under otomicroscopy, once cleared tympanic membrane intact and without effusion.    Left:  Pinna and external meatus normal, normal ear canal skin and caliber without drainage. Impacted cerumen, cleared under otomicroscopy, once cleared tympanic membrane intact and without effusion.   Hearing: Normal speech reception to conversational tone.  Nose: No gross deformity or lesions. No purulent discharge. Septum midline, no turbinate hypertrophy with normal mucosa.  Mouth/Oropharynx: Lips normal, teeth and gums normal with good dentition, normal oral vestibule. Normal floor of mouth, tongue and oral mucosa, no mucosal lesions, ulcer or mass, normal tongue mobility. Uvula midline. Hard and soft palate normal with normal mobility. +1 tonsils, no erythema or exudate.  Larynx: See TFL if applicable  Nasopharynx: See TFL if applicable  Neck: Trachea midline. No masses. No crepitus. Normal thyroid  gland palpation without thyromegaly or nodules palpated. Salivary glands normal to palpation  without swelling, erythema or mass.   Lymphatic: No lymphadenopathy in the neck.  Respiratory: No stridor or distress.  Extremities: No edema or cyanosis. Warm and well-perfused.  Neurologic: CN II-XII intact. Alert and oriented to self, place and time.  Normal reflexes and motor skills, balance and coordination. Moving all extremities without gross abnormality.  Psychiatric:  No unusual anxiety or evidence of depression. Appropriate affect.    Independent Review of Additional Tests or Records:   Medical Records: Reviewed ENT records from this office no outside records reviewed no new test ordered.  Procedures:   Ear Cerumen Removal  Date/Time: 12/16/2023 1:15 PM  Performed by: Olam Vina Simpler, NP Authorized by: Olam Vina Simpler, NP   Procedure Details:  Impacted cerumen: Yes  Cerumen removal location: bilateral Procedure type: curette (suction 5-0, 7-0, forceps)  Post Procedure Details:  Procedure findings: complete impaction removal Patient tolerance of procedure: patient tolerated the procedure without difficulty Comments: The patient was positioned and the ear was examined with the microscope. Obstructing cerumen was gently removed using suction from both ear canals. TMs fully visualized, normal and intact. Middle ears aerated. No sign of infection or cholesteatoma.     Impression & Plans:  Billy Torres is a 68 y.o. male with   1. Sensorineural hearing loss (SNHL), bilateral      2. Bilateral impacted cerumen      3. Tinnitus of both ears       Assessment & Plan 1. Impacted cerumen, bilateral A significant amount of cerumen was removed from both ears during this visit. Regular ear cleaning is recommended to prevent recurrence. A follow-up appointment for ear cleaning is scheduled in 3 months.  This will be scheduled with the pace program and call will be made to pace office to provide set this up.  Patient may also use olive oil or mineral oil every few weeks for  cerumen management.  2.  Tinnitus bilaterally Patient has tinnitus in both ears that is longstanding discussed tinnitus precautions including masking techniques such as white noise machine or white noise app.  3.  Sensorineural hearing loss, bilaterally Patient has known sensorineural hearing loss his last audiogram was in 2021 he also has bilateral hearing aids.  Discussed with patient that updated audiogram is advisable we will see if this could be scheduled with the pace program either shortly after this visit or at next visit with ENT.  Follow-up Follow-up in 3 months for ear cleaning.   Medical Decision Making   Patient is here for a return patient visit for evaluation and treatment of:   A. 1 acute uncomplicated illnesses/conditions (bilateral cerumen impaction) + 2 chronic conditions sensorineural hearing loss and tinnitus, level 3: 99213   B. Review and analysis of results:   No unique test ordered no outside notes reviewed this is a low level of complexity of data for analysis and review. Level 2: 99212  C: Risk of Complications: Over-the-counter drugs or treatments or products were recommended including: Olive oil or mineral oil used periodically  for cerumen management, which poses a low risk of morbidity or mortality, level 3: 99213  Overall level of service: 99213   On the day of the visit I spent 22 minutes preparing to see the patient, ordering medications, test, or procedures, and referring and communicating with other health care professionals (not separately reported).This time does not include any time spent performing procedures or assesments that are separately billable.     Electronically signed by: Olam Vina Simpler, NP 12/16/2023 2:15 PM            [1] Past Medical History: Diagnosis Date   Arthritis    Diabetes (CMD)    Glaucoma    Hearing loss    History of prostate cancer    Hyperlipidemia    Hypertension    Stroke    (CMD)   [2] Past  Surgical History: Procedure Laterality Date   FOOT SURGERY     Procedure: FOOT SURGERY   NECK SURGERY     Procedure: NECK SURGERY   SHOULDER SURGERY     Procedure: SHOULDER SURGERY   WISDOM TOOTH EXTRACTION     Procedure: WISDOM TOOTH EXTRACTION  [3]  Current Outpatient Medications:    acetaminophen  (TYLENOL ) 325 mg tablet, Take  by mouth., Disp: , Rfl:    alfuzosin  (UROXATRAL ) 10 mg Tb24 24 hour tablet, Take 10 mg by mouth., Disp: , Rfl:    aspirin  81 mg EC tablet, Take 81 mg by mouth daily., Disp: , Rfl:    atorvastatin  (LIPITOR ) 40 mg tablet, Take 40 mg by mouth Once Daily., Disp: , Rfl:    brompheniramine-pseudoePHEDrine-DM (BROMFED DM) 2-30-10 mg/5 mL syrp syrup, Take 10 mL by mouth every 4 (four) hours., Disp: 1800 mL, Rfl: 0   cholecalciferol (VITAMIN D3) 2,000 unit tablet, Take 2,000 Units by mouth., Disp: , Rfl:    clopidogreL  (PLAVIX ) 75 mg tablet, Take 75 mg by mouth Once Daily., Disp: , Rfl:    donepeziL  (ARICEPT ) 10 mg tablet, Take 10 mg by mouth nightly., Disp: , Rfl:    gabapentin  (NEURONTIN ) 600 mg tablet, Take 600 mg by mouth., Disp: , Rfl:    gabapentin  (NEURONTIN ) 800 mg tablet, Take 800 mg by mouth., Disp: , Rfl:    lisinopriL  (PRINIVIL ) 10 mg tablet, Take 10 mg by mouth Once Daily., Disp: , Rfl:    loratadine  (CLARITIN ) 10 mg tablet, Take 10 mg by mouth daily., Disp: , Rfl:    metFORMIN  (GLUCOPHAGE  XR) 750 mg 24 hr tablet, Take 750 mg by mouth Once Daily., Disp: , Rfl:    montelukast  (SINGULAIR ) 5 mg chewable tablet, Chew 5 mg., Disp: , Rfl:    pregabalin  (Lyrica ) 100 mg capsule, Take 100 mg by mouth 2 (two) times a day., Disp: , Rfl:    semaglutide (Rybelsus) 7 mg tab tablet, Take 7 mg by mouth., Disp: , Rfl:    sennosides-docusate sodium  (PERICOLACE) 8.6-50 mg per tablet, Take 2 tablets by mouth Once Daily., Disp: , Rfl:

## 2023-12-22 ENCOUNTER — Encounter (HOSPITAL_COMMUNITY): Payer: Self-pay | Admitting: Gastroenterology

## 2023-12-22 ENCOUNTER — Telehealth: Payer: Self-pay | Admitting: Gastroenterology

## 2023-12-22 NOTE — Telephone Encounter (Addendum)
 Procedure:Colonoscopy/Endoscopy Procedure date: 12/29/23 Procedure location: [facility] Arrival Time: 8:30 am Spoke with the patient Y/N:   No, Called [phone] and couldn't leave a voicemail because voicemail is not set up yet, called [phone] phone just rang until it started peeping. Called [phone] and it rang until started peeping.  Any prep concerns? ___  Has the patient obtained the prep from the pharmacy ? ___ Do you have a care partner and transportation: ___ Any additional concerns? ___

## 2023-12-22 NOTE — Progress Notes (Signed)
 Attempted to obtain medical history via telephone, unable to reach at this time. Unable to leave voicemail to return pre surgical testing department's phone call,due to mailbox not set up.

## 2023-12-24 ENCOUNTER — Ambulatory Visit
Admission: RE | Admit: 2023-12-24 | Discharge: 2023-12-24 | Disposition: A | Discharge status: Home / Self Care | Payer: Medicare (Managed Care) | Source: Ambulatory Visit

## 2023-12-24 ENCOUNTER — Other Ambulatory Visit: Payer: Self-pay

## 2023-12-24 DIAGNOSIS — M545 Low back pain, unspecified: Secondary | ICD-10-CM

## 2023-12-25 ENCOUNTER — Emergency Department (HOSPITAL_COMMUNITY): Payer: Medicare (Managed Care)

## 2023-12-25 ENCOUNTER — Emergency Department (HOSPITAL_COMMUNITY)
Admission: EM | Admit: 2023-12-25 | Discharge: 2023-12-25 | Disposition: A | Discharge status: Home / Self Care | Payer: Medicare (Managed Care) | Source: Home / Self Care | Attending: Emergency Medicine | Admitting: Emergency Medicine

## 2023-12-25 ENCOUNTER — Encounter (HOSPITAL_COMMUNITY): Payer: Self-pay

## 2023-12-25 ENCOUNTER — Other Ambulatory Visit: Payer: Self-pay

## 2023-12-25 DIAGNOSIS — R52 Pain, unspecified: Secondary | ICD-10-CM | POA: Diagnosis present

## 2023-12-25 DIAGNOSIS — Z7902 Long term (current) use of antithrombotics/antiplatelets: Secondary | ICD-10-CM | POA: Insufficient documentation

## 2023-12-25 DIAGNOSIS — I10 Essential (primary) hypertension: Secondary | ICD-10-CM | POA: Insufficient documentation

## 2023-12-25 DIAGNOSIS — R0981 Nasal congestion: Secondary | ICD-10-CM | POA: Diagnosis not present

## 2023-12-25 DIAGNOSIS — R531 Weakness: Secondary | ICD-10-CM | POA: Diagnosis not present

## 2023-12-25 DIAGNOSIS — Z7982 Long term (current) use of aspirin: Secondary | ICD-10-CM | POA: Diagnosis not present

## 2023-12-25 DIAGNOSIS — R140 Abdominal distension (gaseous): Secondary | ICD-10-CM | POA: Insufficient documentation

## 2023-12-25 DIAGNOSIS — R5383 Other fatigue: Secondary | ICD-10-CM | POA: Insufficient documentation

## 2023-12-25 DIAGNOSIS — Z79899 Other long term (current) drug therapy: Secondary | ICD-10-CM | POA: Insufficient documentation

## 2023-12-25 LAB — CBC WITH DIFFERENTIAL/PLATELET
Abs Immature Granulocytes: 0.05 K/uL (ref 0.00–0.07)
Basophils Absolute: 0 K/uL (ref 0.0–0.1)
Basophils Relative: 0 %
Eosinophils Absolute: 0.1 K/uL (ref 0.0–0.5)
Eosinophils Relative: 1 %
HCT: 41.4 % (ref 39.0–52.0)
Hemoglobin: 12.8 g/dL — ABNORMAL LOW (ref 13.0–17.0)
Immature Granulocytes: 1 %
Lymphocytes Relative: 16 %
Lymphs Abs: 1.7 K/uL (ref 0.7–4.0)
MCH: 27.8 pg (ref 26.0–34.0)
MCHC: 30.9 g/dL (ref 30.0–36.0)
MCV: 89.8 fL (ref 80.0–100.0)
Monocytes Absolute: 0.9 K/uL (ref 0.1–1.0)
Monocytes Relative: 9 %
Neutro Abs: 7.6 K/uL (ref 1.7–7.7)
Neutrophils Relative %: 73 %
Platelets: 320 K/uL (ref 150–400)
RBC: 4.61 MIL/uL (ref 4.22–5.81)
RDW: 12.2 % (ref 11.5–15.5)
WBC: 10.3 K/uL (ref 4.0–10.5)
nRBC: 0 % (ref 0.0–0.2)

## 2023-12-25 LAB — COMPREHENSIVE METABOLIC PANEL WITH GFR
ALT: 12 U/L (ref 0–44)
AST: 16 U/L (ref 15–41)
Albumin: 3.5 g/dL (ref 3.5–5.0)
Alkaline Phosphatase: 93 U/L (ref 38–126)
Anion gap: 11 (ref 5–15)
BUN: 21 mg/dL (ref 8–23)
CO2: 24 mmol/L (ref 22–32)
Calcium: 9.3 mg/dL (ref 8.9–10.3)
Chloride: 105 mmol/L (ref 98–111)
Creatinine, Ser: 0.95 mg/dL (ref 0.61–1.24)
GFR, Estimated: >60 mL/min (ref >60)
Glucose, Bld: 131 mg/dL — ABNORMAL HIGH (ref 70–99)
Potassium: 4.1 mmol/L (ref 3.5–5.1)
Sodium: 140 mmol/L (ref 135–145)
Total Bilirubin: 0.3 mg/dL (ref 0.0–1.2)
Total Protein: 7.4 g/dL (ref 6.5–8.1)

## 2023-12-25 LAB — URINALYSIS, ROUTINE W REFLEX MICROSCOPIC
Bilirubin Urine: NEGATIVE
Glucose, UA: >=500 mg/dL — AB
Ketones, ur: NEGATIVE mg/dL
Nitrite: NEGATIVE
Protein, ur: 30 mg/dL — AB
Specific Gravity, Urine: 1.023 (ref 1.005–1.030)
WBC, UA: >50 WBC/hpf (ref 0–5)
pH: 7 (ref 5.0–8.0)

## 2023-12-25 LAB — RESP PANEL BY RT-PCR (RSV, FLU A&B, COVID)  RVPGX2
Influenza A by PCR: NEGATIVE
Influenza B by PCR: NEGATIVE
Resp Syncytial Virus by PCR: NEGATIVE
SARS Coronavirus 2 by RT PCR: NEGATIVE

## 2023-12-25 NOTE — ED Notes (Signed)
 Call [facility] with updates [phone]

## 2023-12-25 NOTE — Discharge Instructions (Signed)
 Discussed the results of your MRI, you were given a copy of both reports.  Will personally call you if the results of your urinalysis returned positive.

## 2023-12-25 NOTE — ED Triage Notes (Signed)
 BIB EMS from clinic for bilateral leg pain since Sunday. Left sided AKA. On arrival pt c/o pain all over body Hx of stroke and has a deficit on the left side.

## 2023-12-25 NOTE — ED Provider Notes (Signed)
 [place] [facility]ERGENCY DEPARTMENT AT [facility] Provider Note   CSN: [id number] Arrival date & time: 12/25/23  1420     Patient presents with: Generalized Pain   Billy Torres is a 68 y.o. male.   68 year old male with underlying hypertension, left BKA presents to the ED with a chief complaint of generalized body pain.  Reports the symptoms have been ongoing for the last couple days, he is currently under the care of [facility], states that he was sent here for a MRI to determine where the pain was coming from.  He does endorse some nasal congestion, feeling somewhat fatigued but has not had any medicine to help with his symptoms. No fever, no abdominal pain, no nausea, chest pain or vomiting. Last oral intake this morning, last bowel movement yesterday.   The history is provided by the patient.       Prior to Admission medications  Medication Sig Start Date End Date Taking? Authorizing Provider  acetaminophen  (TYLENOL ) 500 MG tablet Take 1,000 mg by mouth 3 (three) times daily as needed (pain).    [provider]  alfuzosin  (UROXATRAL ) 10 MG 24 hr tablet Take 10 mg by mouth daily with breakfast.    [provider]  aspirin  EC 81 MG tablet Take 81 mg by mouth daily.    [provider]  atorvastatin  (LIPITOR ) 40 MG tablet Take 40 mg by mouth at bedtime.    [provider]  Cholecalciferol (VITAMIN D3) 50 MCG (2000 UT) TABS Take 2,000 Units by mouth in the morning.    [provider]  clopidogrel  (PLAVIX ) 75 MG tablet Take by mouth. 03/12/16   [provider]  donepezil  (ARICEPT ) 10 MG tablet Take 10 mg by mouth at bedtime.    [provider]  gabapentin  (NEURONTIN ) 600 MG tablet Take 600 mg by mouth in the morning.    [provider]  gabapentin  (NEURONTIN ) 800 MG tablet Take 800 mg by mouth at bedtime.    [provider]  lisinopril  (ZESTRIL ) 20 MG tablet Take 20 mg by mouth daily.    [provider]  loratadine  (CLARITIN ) 10 MG tablet Take 10 mg by mouth daily.    [provider]  melatonin 5 MG TABS Take 5 mg by mouth at bedtime.    [provider]  metFORMIN  (GLUCOPHAGE ) 500 MG tablet Take 500 mg by mouth 2 (two) times daily.    [provider]  montelukast  (SINGULAIR ) 5 MG chewable tablet Chew 5 mg by mouth at bedtime.    [provider]  polyethylene glycol (MIRALAX  / GLYCOLAX ) 17 g packet Take 17 g by mouth daily. 02/19/21   Mansouraty, Aloha Raddle., MD  pregabalin  (LYRICA ) 100 MG capsule Take by mouth. 03/12/16   [provider]  Semaglutide (RYBELSUS) 7 MG TABS Take 7 mg by mouth daily.    [provider]  senna-docusate (SENOKOT-S) 8.6-50 MG tablet Take 2 tablets by mouth daily. 03/12/16   [provider]    Allergies: Patient has no known allergies.    Review of Systems  Constitutional:  Negative for fever.  Respiratory:  Negative for shortness of breath.   Musculoskeletal:  Positive for myalgias.    Updated Vital Signs BP (!) 144/93 (BP Location: Left Arm)   Pulse 85   Temp 98.9 F (37.2 C) (Oral)   Resp 18   Ht 5' 7 (1.702 m)   Wt 73.5 kg   SpO2 96%   BMI  25.37 kg/m   Physical Exam Vitals and nursing note reviewed.  Constitutional:      Appearance: Normal appearance.  HENT:     Head: Normocephalic and atraumatic.     Mouth/Throat:     Mouth: Mucous membranes are moist.  Cardiovascular:     Rate and Rhythm: Normal rate.  Pulmonary:     Effort: Pulmonary effort is normal.     Breath sounds: No wheezing.  Abdominal:     General: Abdomen is flat. There is distension.     Palpations: Abdomen is soft.  Musculoskeletal:     Cervical back: Normal range of motion and neck supple.     Left Lower Extremity: Left leg is amputated above knee.  Skin:    General: Skin is warm and dry.  Neurological:     Mental Status: He is alert and oriented to person, place, and time.     Motor: Weakness  present.     Comments: Unable to lift bilateral legs from the bed, sensation is intact.      (all labs ordered are listed, but only abnormal results are displayed) Labs Reviewed  CBC WITH DIFFERENTIAL/PLATELET - Abnormal; Notable for the following components:      Result Value   Hemoglobin 12.8 (*)    All other components within normal limits  COMPREHENSIVE METABOLIC PANEL WITH GFR - Abnormal; Notable for the following components:   Glucose, Bld 131 (*)    All other components within normal limits  RESP PANEL BY RT-PCR (RSV, FLU A&B, COVID)  [id number]  URINALYSIS, ROUTINE W REFLEX MICROSCOPIC    EKG: None  Radiology: MR LUMBAR SPINE WO CONTRAST Result Date: 12/25/2023 CLINICAL DATA:  Initial evaluation for acute low back pain with bilateral leg pain. Motor neuron disease suspected. EXAM: MRI LUMBAR SPINE WITHOUT CONTRAST TECHNIQUE: Multiplanar, multisequence MR imaging of the lumbar spine was performed. No intravenous contrast was administered. COMPARISON:  Comparison made with radiograph from 12/24/2023. FINDINGS: Segmentation: Standard. Lowest well-formed disc space labeled the L5-S1 level. Alignment: Aligned straightening of the normal lumbar lordosis. No listhesis or static subluxation. Vertebrae: Vertebral body height maintained without acute or chronic fracture. Bone marrow signal intensity within normal limits. Benign hemangioma noted within the S1 segment. No worrisome osseous lesions. Degenerative reactive endplate change with marrow edema present about the T12-L1 and L5-S1 interspaces. Conus medullaris and cauda equina: Conus extends to the L1 level. Conus and cauda equina appear normal. Paraspinal and other soft tissues: Paraspinous soft tissues demonstrate no acute finding. Few small T2 hyperintense renal cysts noted about the kidneys, with a cyst at the left kidney likely demonstrating small amount of internal milk of calcium . These are benign in appearance, with no follow-up  imaging recommended regarding these findings. Disc levels: L1-2: Normal interspace. Mild right greater left facet hypertrophy. No stenosis. L2-3: Disc desiccation without significant disc bulge. Mild bilateral facet spurring. No spinal stenosis. Foramina remain patent. L3-4: Disc desiccation without significant disc bulge. Mild bilateral facet hypertrophy. No canal or foraminal stenosis. L4-5: Disc desiccation with diffuse disc bulge. Superimposed central to right subarticular disc protrusion (series 6, image 25). Mild facet and ligament flavum hypertrophy with trace joint effusions. Resultant mild narrowing of the lateral recesses, right worse than left. Central canal remains patent. Mild bilateral L4 foraminal stenosis. L5-S1: Degenerative intervertebral disc space narrowing with mild disc bulge. Reactive endplate change with marginal endplate osteophytic spurring and marrow edema. Mild bilateral facet hypertrophy. Epidural lipomatosis. No significant spinal stenosis. Foramina remain patent. IMPRESSION: 1.  No acute abnormality within the lumbar spine. 2. Central to right subarticular disc protrusion at L4-5 with resultant mild right greater than left lateral recess stenosis, with mild bilateral L4 foraminal narrowing. 3. Degenerative disc bulging with reactive endplate change and marrow edema at L5-S1 without significant stenosis or impingement. 4. Mild bilateral facet hypertrophy at L1-2 through L5-S1. Electronically Signed   By: Morene Hoard M.D.   On: 12/25/2023 19:12   MR BRAIN WO CONTRAST Result Date: 12/25/2023 CLINICAL DATA:  Initial evaluation for acute bilateral leg pain, motor neuron disease suspected. EXAM: MRI HEAD WITHOUT CONTRAST TECHNIQUE: Multiplanar, multiecho pulse sequences of the brain and surrounding structures were obtained without intravenous contrast. COMPARISON:  CT from 01/29/2023 as well as earlier studies. FINDINGS: Brain: Diffuse prominence of the CSF containing spaces  compatible with atrophy, mildly advanced for age. Patchy and confluent T2/FLAIR hyperintensity involving the periventricular and deep white matter both cerebral hemispheres, consistent with chronic small vessel ischemic disease, moderate to advanced in nature. Mild patchy involvement of the pons noted. Multiple remote lacunar infarcts present about the bilateral corona radiata and basal ganglia. Associated chronic hemosiderin staining at the anterior left basal ganglia noted (series 10, image 36). No evidence for acute or subacute infarct. No areas of chronic cortical infarction. No acute or significant chronic intracranial blood products. No mass lesion, midline shift or mass effect. No hydrocephalus or extra-axial fluid collection. Pituitary gland within normal limits. Vascular: Major intracranial vascular flow voids are maintained. Skull and upper cervical spine: Craniocervical junction within normal limits. Bone marrow signal intensity within normal limits. Note made of a 6 mm T1 hypointense lesion within the C3 vertebral body, relatively stable as compared to previous MRI from at least 2017, consistent with a benign finding. Postoperative changes partially visualize within the cervical spine. No scalp soft tissue abnormality. Sinuses/Orbits: Globes orbital soft tissues within normal limits. Mild scattered mucosal thickening about the ethmoidal air cells. Paranasal sinuses are otherwise clear. Trace left mastoid effusion noted, bowel significance. Other: None. IMPRESSION: 1. No acute intracranial abnormality. 2. Age-related cerebral atrophy with moderate to advanced chronic microvascular ischemic disease, with multiple remote lacunar infarcts about the bilateral corona radiata and basal ganglia. Electronically Signed   By: Morene Hoard M.D.   On: 12/25/2023 19:02   DG Lumbar Spine 2-3 Views Result Date: 12/25/2023 CLINICAL DATA:  Lower back pain without known injury EXAM: LUMBAR SPINE - 2-3 VIEW  COMPARISON:  December 16, 2005 FINDINGS: No fracture or spondylolisthesis is noted. Minimal anterior osteophyte formation is noted at L2-3, L3-4 and L4-5. Disc spaces appear to be well maintained. Mild diffuse osteopenia is noted. Aortic atherosclerosis. IMPRESSION: Minimal multilevel degenerative changes. No acute abnormality seen. Electronically Signed   By: Lynwood Landy Raddle M.D.   On: 12/25/2023 08:36     Procedures   Medications Ordered in the ED - No data to display                                  Medical Decision Making Amount and/or Complexity of Data Reviewed Labs: ordered. Radiology: ordered.    This patient presents to the ED for concern of generalized weakness, this involves a number of treatment options, and is a complaint that carries with it a high risk of complications and morbidity.  The differential diagnosis includes infection, CVA, versus viral illness.    Co morbidities: Discussed in HPI   Brief History:  See  HPI.   EMR reviewed including pt PMHx, past surgical history and past visits to ER.   See HPI for more details   Lab Tests:  I ordered and independently interpreted labs.  The pertinent results include:    I personally reviewed all laboratory work and imaging. Metabolic panel without any acute abnormality specifically kidney function within normal limits and no significant electrolyte abnormalities. CBC without leukocytosis or significant anemia.   Imaging Studies:  MRI lumbar spine: IMPRESSION:  1. No acute abnormality within the lumbar spine.  2. Central to right subarticular disc protrusion at L4-5 with  resultant mild right greater than left lateral recess stenosis, with  mild bilateral L4 foraminal narrowing.  3. Degenerative disc bulging with reactive endplate change and  marrow edema at L5-S1 without significant stenosis or impingement.  4. Mild bilateral facet hypertrophy at L1-2 through L5-S1.   MRI Brain showed: IMPRESSION:  1.  No acute intracranial abnormality.  2. Age-related cerebral atrophy with moderate to advanced chronic  microvascular ischemic disease, with multiple remote lacunar  infarcts about the bilateral corona radiata and basal ganglia.   Medicines ordered:  N/A  Reevaluation:  After the interventions noted above I re-evaluated patient and found that they have :stayed the same  Social Determinants of Health:  The patient's social determinants of health were a factor in the care of this patient  Problem List / ED Course:  Patient with a chief complaint of generalized body aches x a few days, [facility] patient with prior hx of stroke with left sided deficit and left aka. Here with not really walking over the last couple days, his son at the bedside is concerned that patient is not weightbearing anymore.  Is complaining of pain along bilateral lower extremities.  Some concern for infection although he is minimally lifting bilateral legs.  CMP with no [facility] other,, creatinine levels unremarkable.  CBC with no leukocytosis, there is no sources of infection on his legs, no concern for cellulitis.  He is afebrile here.  Respiratory panel is negative for any influenza versus COVID versus RSV. MRI lumbar spine obtained which that shows him stenosis, no acute findings.  MRI brain obtained to further evaluate any recurrent stroke.  Does have remote lacunar strokes from the past.  Not endorsing any new deficits at this time.  Urinalysis checked to rule out any active infection worsening his weakness. Discussed all results with son at the bedside, we discussed propria follow-up with [facility], I will personally call him once UA returns for any infection.  Patient is hemodynamically stable for discharge.  Dispostion:  After consideration of the diagnostic results and the patients response to treatment, I feel that the patent would benefit from follow up with PCP.    Portions of this note were generated with Administrator, sports. Dictation errors may occur despite best attempts at proofreading.   Final diagnoses:  Generalized pain    ED Discharge Orders     None          Maureen Broad, PA-C 12/25/23 2251    Franklyn Sid SAILOR, MD 12/25/23 [phone]

## 2023-12-25 NOTE — Telephone Encounter (Signed)
 Noted all information mailed to the pt home

## 2023-12-29 ENCOUNTER — Telehealth: Payer: Self-pay | Admitting: Gastroenterology

## 2023-12-29 ENCOUNTER — Ambulatory Visit (HOSPITAL_COMMUNITY): Admission: RE | Admit: 2023-12-29 | Payer: Medicare (Managed Care) | Admitting: Gastroenterology

## 2023-12-29 SURGERY — COLONOSCOPY
Anesthesia: Monitor Anesthesia Care

## 2023-12-29 NOTE — Telephone Encounter (Signed)
 Inbound call from [facility] reps name is Chaffee requesting to reschedule this patient colonoscopy procedure due to the medication not taking. A good call back number is [phone].

## 2023-12-29 NOTE — Progress Notes (Signed)
 [facility] [phone] talked to Jasmine and patient did not hold Plavix  or the semaglutide. She was calling office about rescheduling. I told her I can take her off the hospital schedule for today but the office would contact them about rescheduling.

## 2023-12-29 NOTE — Telephone Encounter (Signed)
 Billy Torres has been advised and aware they will be contacted after the first of the year to reschedule

## 2024-01-22 ENCOUNTER — Other Ambulatory Visit: Payer: Self-pay | Admitting: Gastroenterology

## 2024-01-22 MED ORDER — NA SULFATE-K SULFATE-MG SULF 17.5-3.13-1.6 GM/177ML PO SOLN: 1.0000 | Freq: Once | ORAL | 0 refills | Status: AC

## 2024-01-22 NOTE — Telephone Encounter (Signed)
 Spoke with Billy Torres at [facility] and advised that colon has been set up for 02/19/24 at [facility] at 930 am with GM  Plavix  hold requested.  All information faxed to [phone]

## 2024-01-22 NOTE — Telephone Encounter (Signed)
 Spoke with [employer], Jasmine [phone]). She would like to know if there have been any updates on rescheduling patient's colonoscopy.

## 2024-02-06 ENCOUNTER — Encounter (HOSPITAL_COMMUNITY): Payer: Self-pay | Admitting: Gastroenterology

## 2024-02-06 NOTE — Progress Notes (Signed)
 Attempted to obtain medical history for pre op call via telephone, unable to reach at this time. HIPAA compliant voicemail message left requesting return call to pre surgical testing department.

## 2024-02-11 ENCOUNTER — Telehealth: Payer: Self-pay | Admitting: Gastroenterology

## 2024-02-11 NOTE — Telephone Encounter (Addendum)
 Procedure:Colonoscopy/Endoscopy Procedure date: 02/19/24 Procedure location: [facility] Arrival Time: 8:00 am Spoke with the patient Y/N:   No, called [phone] and couldn't leave a message because voicemail has not been set up yet. I left a detailed message on [phone] on 02/11/24 @ 2:32 pm for the patient to return call  No, called [phone] and couldn't leave a message because voicemail has not been set up yet. I left a detailed message on [phone] on 02/12/24 @ 2:11 pm for the patient to return call  02/13/24 received a message from Harlene Cleaves form [facility]. And I talked with Elenor Lee at the hospital, everyone informed.  Any prep concerns? No  Has the patient obtained the prep from the pharmacy ? Yes Do you have a care partner and transportation: Yes Any additional concerns? No

## 2024-02-19 ENCOUNTER — Ambulatory Visit (HOSPITAL_COMMUNITY)
Admission: RE | Admit: 2024-02-19 | Payer: Medicare (Managed Care) | Source: Home / Self Care | Admitting: Gastroenterology

## 2024-02-19 ENCOUNTER — Encounter (HOSPITAL_COMMUNITY): Admission: RE | Payer: Self-pay | Source: Home / Self Care

## 2024-02-19 SURGERY — COLONOSCOPY
Anesthesia: Monitor Anesthesia Care
